# Patient Record
Sex: Male | Born: 1960 | Race: Black or African American | Hispanic: No | Smoking: Former smoker
Health system: Southern US, Community
[De-identification: ages and names within clinical notes are randomized; demographics above are authoritative.]

## PROBLEM LIST (undated history)

## (undated) DIAGNOSIS — J449 Chronic obstructive pulmonary disease, unspecified: Secondary | ICD-10-CM

## (undated) DIAGNOSIS — N182 Chronic kidney disease, stage 2 (mild): Secondary | ICD-10-CM

## (undated) DIAGNOSIS — J111 Influenza due to unidentified influenza virus with other respiratory manifestations: Secondary | ICD-10-CM

## (undated) DIAGNOSIS — Z720 Tobacco use: Secondary | ICD-10-CM

## (undated) DIAGNOSIS — I10 Essential (primary) hypertension: Secondary | ICD-10-CM

## (undated) DIAGNOSIS — D696 Thrombocytopenia, unspecified: Secondary | ICD-10-CM

## (undated) DIAGNOSIS — J189 Pneumonia, unspecified organism: Secondary | ICD-10-CM

## (undated) HISTORY — DX: Tobacco use: Z72.0

## (undated) HISTORY — DX: Chronic obstructive pulmonary disease, unspecified: J44.9

## (undated) HISTORY — PX: KNEE SURGERY: SHX244

## (undated) HISTORY — DX: Chronic kidney disease, stage 2 (mild): N18.2

## (undated) HISTORY — PX: FRACTURE SURGERY: SHX138

## (undated) HISTORY — DX: Thrombocytopenia, unspecified: D69.6

## (undated) HISTORY — DX: Influenza due to unidentified influenza virus with other respiratory manifestations: J11.1

---

## 2009-06-12 ENCOUNTER — Emergency Department (HOSPITAL_COMMUNITY): Admission: EM | Admit: 2009-06-12 | Discharge: 2009-06-12 | Payer: Self-pay | Admitting: Emergency Medicine

## 2010-06-29 ENCOUNTER — Inpatient Hospital Stay (HOSPITAL_COMMUNITY): Payer: Self-pay

## 2010-06-29 ENCOUNTER — Emergency Department (HOSPITAL_COMMUNITY): Payer: Self-pay

## 2010-06-29 ENCOUNTER — Inpatient Hospital Stay (HOSPITAL_COMMUNITY)
Admission: EM | Admit: 2010-06-29 | Discharge: 2010-06-30 | DRG: 190 | Disposition: A | Discharge status: Home / Self Care | Payer: Self-pay | Attending: Internal Medicine | Admitting: Internal Medicine

## 2010-06-29 DIAGNOSIS — A419 Sepsis, unspecified organism: Secondary | ICD-10-CM | POA: Diagnosis present

## 2010-06-29 DIAGNOSIS — J189 Pneumonia, unspecified organism: Secondary | ICD-10-CM | POA: Diagnosis present

## 2010-06-29 DIAGNOSIS — J441 Chronic obstructive pulmonary disease with (acute) exacerbation: Principal | ICD-10-CM | POA: Diagnosis present

## 2010-06-29 DIAGNOSIS — J9819 Other pulmonary collapse: Secondary | ICD-10-CM | POA: Diagnosis present

## 2010-06-29 DIAGNOSIS — F17200 Nicotine dependence, unspecified, uncomplicated: Secondary | ICD-10-CM | POA: Diagnosis present

## 2010-06-29 DIAGNOSIS — J45901 Unspecified asthma with (acute) exacerbation: Principal | ICD-10-CM | POA: Diagnosis present

## 2010-06-29 DIAGNOSIS — D7589 Other specified diseases of blood and blood-forming organs: Secondary | ICD-10-CM | POA: Diagnosis present

## 2010-06-29 LAB — CBC
HCT: 44.9 % (ref 39.0–52.0)
Hemoglobin: 15 g/dL (ref 13.0–17.0)
MCHC: 33.4 g/dL (ref 30.0–36.0)
MCV: 76.9 fL — ABNORMAL LOW (ref 78.0–100.0)
RBC: 5.84 MIL/uL — ABNORMAL HIGH (ref 4.22–5.81)
RDW: 13.2 % (ref 11.5–15.5)
WBC: 13 10*3/uL — ABNORMAL HIGH (ref 4.0–10.5)

## 2010-06-29 LAB — BASIC METABOLIC PANEL
CO2: 26 mEq/L (ref 19–32)
Chloride: 107 mEq/L (ref 96–112)
Creatinine, Ser: 1.32 mg/dL (ref 0.4–1.5)
GFR calc Af Amer: >60 mL/min (ref >60)
Glucose, Bld: 99 mg/dL (ref 70–99)

## 2010-06-29 LAB — DIFFERENTIAL
Basophils Relative: 1 % (ref 0–1)
Eosinophils Relative: 7 % — ABNORMAL HIGH (ref 0–5)
Monocytes Relative: 10 % (ref 3–12)

## 2010-06-30 LAB — DIFFERENTIAL
Basophils Relative: 0 % (ref 0–1)
Eosinophils Absolute: 0 10*3/uL (ref 0.0–0.7)
Eosinophils Relative: 0 % (ref 0–5)
Monocytes Absolute: 2.1 10*3/uL — ABNORMAL HIGH (ref 0.1–1.0)
Neutrophils Relative %: 87 % — ABNORMAL HIGH (ref 43–77)

## 2010-06-30 LAB — BASIC METABOLIC PANEL
BUN: 11 mg/dL (ref 6–23)
Calcium: 9.3 mg/dL (ref 8.4–10.5)
Creatinine, Ser: 1.23 mg/dL (ref 0.4–1.5)
GFR calc Af Amer: >60 mL/min (ref >60)
GFR calc non Af Amer: >60 mL/min (ref >60)
Sodium: 141 mEq/L (ref 135–145)

## 2010-06-30 LAB — CBC
RBC: 5.58 MIL/uL (ref 4.22–5.81)
RDW: 13.5 % (ref 11.5–15.5)
WBC: 26.8 10*3/uL — ABNORMAL HIGH (ref 4.0–10.5)

## 2010-07-02 NOTE — H&P (Signed)
[name], [name]                 ACCOUNT NO.:  [id number]  MEDICAL RECORD NO.:  [id number]           PATIENT TYPE:  I  LOCATION:  3740                         FACILITY:  [facility]  PHYSICIAN:  [name] [name], M.D. DATE OF BIRTH:  [birth year]  DATE OF ADMISSION:  06/29/2010 DATE OF DISCHARGE:                             HISTORY & PHYSICAL   PRIMARY CARE PHYSICIAN:  None.  CHIEF COMPLAINT:  Shortness of breath, wheezing, chest tightness.  HISTORY OF PRESENT ILLNESS:  This is a 50 year old male who presents to the emergency department emergently secondary to complaints of worsening shortness of breath, wheezing, and chest tightness over the past 4 days. The patient does have a history of asthma and is also a smoker.  He denies having any fevers, chills, nausea, vomiting, or diarrhea.  He does report having a cough that has been nonproductive.  PAST MEDICAL HISTORY:  Asthma.  PAST SURGICAL HISTORY:  Right knee arthroscopic surgery.  MEDICATIONS:  Albuterol inhaler.  ALLERGIES:  No known drug allergies.  SOCIAL HISTORY:  The patient is a Curator.  He is a smoker, smokes a pack of cigarettes daily for 42 years.  He is a drinker, drinks 1 beer, 40 ounces daily.  He denies any illicit drug usage.  FAMILY HISTORY:  Noncontributory.  REVIEW OF SYSTEMS:  Pertinent as mentioned above.  PHYSICAL EXAMINATION FINDINGS:  GENERAL:  This is a 50 year old well- nourished, well-developed African American male who is in discomfort and mild distress. VITAL SIGNS:  Temperature 98.1, blood pressure 145/96, heart rate 92 to 108, respirations 22, O2 sats 93 to 98%. HEENT:  Normocephalic, atraumatic.  Pupils equally round, reactive to light.  Extraocular movements are intact.  Funduscopic benign.  There is no scleral icterus.  Nares are patent bilaterally.  Oropharynx is clear. NECK:  Supple.  Full range of motion.  No thyromegaly, adenopathy, jugular venous distention. CARDIOVASCULAR:   Regular rate and rhythm.  No murmurs, gallops, or rubs appreciated. LUNGS:  Clear to auscultation bilaterally.  No rales, rhonchi, or wheezes. ABDOMEN:  Positive bowel sounds, soft, nontender, nondistended.  No hepatosplenomegaly. EXTREMITIES:  Without cyanosis, clubbing, or edema. NEUROLOGIC:  Nonfocal.  LABORATORY STUDIES:  White blood cell count 13.0, hemoglobin 15.0, hematocrit 44.9, MCV 76.9, platelets 217, neutrophils 55%, lymphocytes 27%.  Sodium 139, potassium 3.8, chloride 107, carbon dioxide 26, BUN 10, creatinine 1.32, and glucose 99.  Calcium level 8.9.  Chest x-ray reveals no acute disease findings.  ASSESSMENT:  A 50 year old male being admitted with, 1. Asthma exacerbation. 2. Shortness of breath. 3. Tobacco abuse.  PLAN:  The patient will be admitted to telemetry area for monitoring and IV steroid taper has been initiated.  The patient has been placed on supplemental oxygen and nebulizer treatments will continue to be administered.  The patient will be placed on albuterol and Atrovent nebulizer treatments and the Avelox therapy will continue but changed to oral therapy.  A nicotine patch will also be ordered and the patient has been counseled regarding tobacco cessation.  DVT prophylaxis has also been ordered.  The patient is a full code.     [name] [name]  [name] [name], M.D.     HJ/MEDQ  D:  06/29/2010  T:  06/29/2010  Job:  [id number]  Electronically Signed by [name] [name] M.D. on 07/02/2010 11:25:08 PM

## 2010-07-22 NOTE — Discharge Summary (Signed)
[name] [name], [name] [name]                 ACCOUNT NO.:  [id number]  MEDICAL RECORD NO.:  [id number]           PATIENT TYPE:  I  LOCATION:  3010                         FACILITY:  [facility]  PHYSICIAN:  Valetta Close, M.D.   DATE OF BIRTH:  May 29, 1960  DATE OF ADMISSION:  06/29/2010 DATE OF DISCHARGE:  06/30/2010                              DISCHARGE SUMMARY   DISCHARGE DIAGNOSES: 1. Left lower lung pneumonia improved. 2. Asthma/chronic obstructive pulmonary disease exacerbation. 3. Inadequate material resources. 4. Macrocytosis but with normal hemoglobin. 5. Tobacco use.  DISCHARGE MEDICATIONS: 1. Doxycycline 100 mg by mouth twice a day for 6 days. 2. Albuterol 90 mcg inhaler 2 puffs every 4 hours as needed for     shortness of breath. 3. Prednisone 50 mg on March 23, then 40 mg on March 24, then 30 mg,     then 20 mg, then 10 mg, then stop.  DISPOSITION AND FOLLOWUP:  He will follow up with Julieanne Manson on Tuesday April 24 at 3 p.m.  I feel I would have had him a followup appointment considerably sooner than this given his asthma may be worsening and given for earlier followup given that he is here with a pneumonia and that does have a high re-admission date, unfortunately this is the earliest we can get.  At this point, please consider obtaining pulmonary function tests.  I have also talked with him about the need for screening colonoscopy given he has a mother who died of colon cancer and he has never had a colonoscopy and he does have macrocytosis plus hemoglobin was stable, so we will discuss this with him.  We will also discuss tobacco cessation and make sure that he does not run out of his albuterol.  I explained that he might need a controller medication, but again he might be unable to avoid it.  BRIEF ADMISSION HISTORY AND PHYSICAL:  [name] [name] is a 50 year old with a past medical history as previously stated who presented with shortness of breath and asthma  exacerbation and cough.  For more detailed history and physical, please refer to the admission dictation by Dr. Della Goo.  HOSPITAL COURSE: 1. Left lower lung pneumonia, did have atelectasis on the x-ray.  Also     had a heart rate of 108 and a respiratory rate of 22, make him meet     criteria for sepsis.  We started him on Avelox, he did improve.  We     gave him steroids as we think we it resolved; however, he is unable     to afford Avelox, so I have switched him to doxycycline and for 1     day of doxycycline, he continues to do well.  He is also on oral     steroids and he is doing well from that standpoint as well, so we     will discharge him on doxycycline, steroids, and albuterol and     again the need for outpatient followup is crucial. 2. Sepsis.  Again this was present on admission as previously stated.     His white count  was 13, his heart rate was greater than 90s,     respiratory rate was greater than 20.  This was before he received     any steroid.  Again, he has improved his white count of 27 but that     is after we begun his steroids.  He clinically appears okay.  He is     afebrile.  His heart rate is okay and he looks clinically well, so     discharge home is reasonable.  DISCHARGE LABS AND VITALS:  Temperature 98, heart rate 81, blood pressure 153/96, respiratory rate 19, O2 sat 97% on 2 L, white count 27, hemoglobin 14, hematocrit 44, platelets 234, sodium 141, potassium 4, chloride 105, bicarb of 30, BUN 11, creatinine 1.22, glucose 109, calcium 9.3.  Approximately length of this discharge was approximately 30 minutes.     Valetta Close, M.D.     JC/MEDQ  D:  06/30/2010  T:  06/30/2010  Job:  [id number]  cc:   Marcene Duos, M.D.  Electronically Signed by Valetta Close M.D. on 07/22/2010 11:34:35 AM

## 2010-10-19 ENCOUNTER — Encounter: Payer: Self-pay | Admitting: Internal Medicine

## 2010-10-31 ENCOUNTER — Other Ambulatory Visit: Payer: Self-pay | Admitting: Internal Medicine

## 2010-11-16 ENCOUNTER — Telehealth: Payer: Self-pay | Admitting: *Deleted

## 2010-11-16 NOTE — Telephone Encounter (Signed)
[facility] Previsit for 11/21/10 at 4:30pm

## 2010-11-21 ENCOUNTER — Telehealth: Payer: Self-pay | Admitting: *Deleted

## 2010-11-21 ENCOUNTER — Encounter: Payer: Self-pay | Admitting: *Deleted

## 2010-11-21 NOTE — Telephone Encounter (Signed)
Spoke with wife, with pt. Talking in the background.  His wife said he [facility] today's previsit forgetting she has appts. On M, W, F's and he can only have his appt. On Tuesday or Thursday.  I offered him an 8am appt on Thursday 11/24/10 and he refused it and didn't want to come tomorrow(Tuesday)11/22/10.  I advised his wife to have him call and [facility] his colon when he was sure of his schedule, that it was too late after  Thursday to have the PV for his scheduled colon date on the 11/30/10.  Wyona Almas

## 2010-11-30 ENCOUNTER — Other Ambulatory Visit: Payer: Self-pay | Admitting: Internal Medicine

## 2011-06-08 ENCOUNTER — Encounter (HOSPITAL_COMMUNITY): Payer: Self-pay | Admitting: Emergency Medicine

## 2011-06-08 ENCOUNTER — Emergency Department (INDEPENDENT_AMBULATORY_CARE_PROVIDER_SITE_OTHER)
Admission: EM | Admit: 2011-06-08 | Discharge: 2011-06-08 | Disposition: A | Discharge status: Home Health | Payer: Self-pay | Source: Home / Self Care | Attending: Emergency Medicine | Admitting: Emergency Medicine

## 2011-06-08 DIAGNOSIS — M109 Gout, unspecified: Secondary | ICD-10-CM

## 2011-06-08 LAB — POCT I-STAT, CHEM 8
Calcium, Ion: 1.23 mmol/L (ref 1.12–1.32)
Chloride: 102 mEq/L (ref 96–112)
Creatinine, Ser: 1.4 mg/dL — ABNORMAL HIGH (ref 0.50–1.35)
HCT: 54 % — ABNORMAL HIGH (ref 39.0–52.0)
Hemoglobin: 18.4 g/dL — ABNORMAL HIGH (ref 13.0–17.0)
Potassium: 4.2 mEq/L (ref 3.5–5.1)
TCO2: 28 mmol/L (ref 0–100)

## 2011-06-08 LAB — URIC ACID: Uric Acid, Serum: 8.3 mg/dL — ABNORMAL HIGH (ref 4.0–7.8)

## 2011-06-08 MED ORDER — PREDNISONE 20 MG PO TABS: ORAL_TABLET | ORAL | Status: AC

## 2011-06-08 MED ORDER — DICLOFENAC SODIUM 1 % TD GEL: 1.0000 "application " | Freq: Four times a day (QID) | TRANSDERMAL | Status: DC

## 2011-06-08 MED ORDER — HYDROCODONE-ACETAMINOPHEN 5-325 MG PO TABS: 2.0000 | ORAL_TABLET | ORAL | Status: AC | PRN

## 2011-06-08 MED ORDER — COLCHICINE 0.6 MG PO TABS: ORAL_TABLET | ORAL | Status: DC

## 2011-06-08 NOTE — ED Notes (Signed)
Give patient urinal at patient request

## 2011-06-08 NOTE — Discharge Instructions (Signed)
You will  need to see an orthopedic surgeon to have the fluid drawn off of your knee. Take the medication as written. Return if you've a fever above 100.4, if you get worse, or for any other concerns.

## 2011-06-08 NOTE — ED Notes (Signed)
Pt did not want Ace wrap on Lt knee and MD Mortenson informed of what Pt stated.

## 2011-06-08 NOTE — ED Provider Notes (Signed)
History     CSN: [id number]  Arrival date & time 06/08/11  1053   First MD Initiated Contact with Patient 06/08/11 1149      Chief Complaint  Patient presents with  . Joint Swelling    (Consider location/radiation/quality/duration/timing/severity/associated sxs/prior treatment) HPI  Past Medical History  Diagnosis Date  . Asthma   . Gout     Past Surgical History  Procedure Date  . Knee surgery     right knee s/p trauma    History reviewed. No pertinent family history.  History  Substance Use Topics  . Smoking status: Current Everyday Smoker  . Smokeless tobacco: Not on file  . Alcohol Use: Yes      Review of Systems  Allergies  Review of patient's allergies indicates no known allergies.  Home Medications   Current Outpatient Rx  Name Route Sig Dispense Refill  . ACETAMINOPHEN 500 MG PO TABS Oral Take 500 mg by mouth every 6 (six) hours as needed.    . ALBUTEROL IN Inhalation Inhale into the lungs.      BP 135/86  Pulse 95  Temp(Src) 98.9 F (37.2 C) (Oral)  Resp 16  SpO2 96%  Physical Exam  ED Course  Procedures (including critical care time)   Labs Reviewed  URIC ACID   No results found.   No diagnosis found.  Patient not seen by med  MDM          Randa Spike, MD 06/29/11 [phone]

## 2011-06-08 NOTE — ED Provider Notes (Signed)
History     CSN: [id number]  Arrival date & time 06/08/11  1053   First MD Initiated Contact with Patient 06/08/11 1149      Chief Complaint  Patient presents with  . Joint Swelling    (Consider location/radiation/quality/duration/timing/severity/associated sxs/prior treatment) HPI Comments: Patient with left knee swelling, tenderness starting approximately a week ago after going for an extended walk. States his knee was feeling "stiff" prior to this, as if he was about to have a gout flare. Patient states that he is not taking his maintenance medications, and does not remember the name of these. Has also not been adhering to a strict diet.. States that last time this happened, and he had a therapeutic knee tap, and was diagnosed with gout. This happened about 6 or 7 years ago. States he usually gets gout in his left knee or his left foot, and that this is identical to previous gout flares. States his left foot is "fine" today. No nausea, vomiting, fevers, rash, bruising, distal paresthesias. No recent or remote history of trauma. Patient is not diabetic.  ROS as noted in HPI. All other ROS negative.   Patient is a 51 y.o. male presenting with knee pain. The history is provided by the patient. No language interpreter was used.  Knee Pain This is a recurrent problem. The current episode started more than 1 week ago. The problem occurs constantly. The problem has not changed since onset.The symptoms are aggravated by walking. The symptoms are relieved by nothing. He has tried a cold compress for the symptoms. The treatment provided mild relief.    Past Medical History  Diagnosis Date  . Asthma   . Gout     Past Surgical History  Procedure Date  . Knee surgery     right knee s/p trauma    History reviewed. No pertinent family history.  History  Substance Use Topics  . Smoking status: Current Everyday Smoker  . Smokeless tobacco: Not on file  . Alcohol Use: Yes      Review  of Systems  Allergies  Review of patient's allergies indicates no known allergies.  Home Medications   Current Outpatient Rx  Name Route Sig Dispense Refill  . ALBUTEROL IN Inhalation Inhale into the lungs.    . COLCHICINE 0.6 MG PO TABS  2 tabs po x 1, then one tab po 1 hour later 6 tablet 0  . DICLOFENAC SODIUM 1 % TD GEL Topical Apply 1 application topically 4 (four) times daily. 100 g 0  . HYDROCODONE-ACETAMINOPHEN 5-325 MG PO TABS Oral Take 2 tablets by mouth every 4 (four) hours as needed for pain. 20 tablet 0  . PREDNISONE 20 MG PO TABS  2 tabs po once daily x 3 days 6 tablet 0    BP 135/86  Pulse 95  Temp(Src) 98.9 F (37.2 C) (Oral)  Resp 16  SpO2 96%  Physical Exam  Nursing note and vitals reviewed. Constitutional: He is oriented to person, place, and time. He appears well-developed and well-nourished.  HENT:  Head: Normocephalic and atraumatic.  Eyes: Conjunctivae and EOM are normal.  Neck: Normal range of motion.  Cardiovascular: Normal rate.   Pulmonary/Chest: Effort normal. No respiratory distress.  Abdominal: He exhibits no distension.  Musculoskeletal: Normal range of motion.       Left knee: He exhibits swelling and effusion.       Increased warmth when compared to other knee. No rash, redness., skin intact. Knee ROM slightly decreased due  to pain, Flexion  intact, Tenderness entire joint, Patella NT, Patellar apprehension test negative, Patellar tendon NT, Medial joint NT , Lateral joint NT, Popliteal region NT, Lachman's stable, Varus stress testing stable, Valgus stress testing stable, McMurray's testing normal, distal NVI with intact baseline sensation / motor / pulse distal to knee.  Neurological: He is alert and oriented to person, place, and time.  Skin: Skin is warm and dry.  Psychiatric: He has a normal mood and affect. His behavior is normal.    ED Course  Procedures (including critical care time)  Labs Reviewed  URIC ACID - Abnormal; Notable  for the following:    Uric Acid, Serum 8.3 (*)    All other components within normal limits  POCT I-STAT, CHEM 8 - Abnormal; Notable for the following:    Creatinine, Ser 1.40 (*)    Hemoglobin 18.4 (*)    HCT 54.0 (*)    All other components within normal limits   No results found.   1. Gout attack       MDM  Previous records reviewed, noncontributory. Do not suspect septic joint. Patient states he has a history of gout in his knee, and he is not taking the medicines he is supposed to to prevent it. Patient with no record of elevated uric acid level, or diagnosis of gout in the current system. We'll send off a uric acid level advised patient to [place] a working phone number so that we can contact him if needed. Patient has mild renal insufficiency, so will not start him on systemic NSAIDs. Rather, we'll send him home with prednisone, colchicine x3 doses, and topical diclofenac. Will have him see an orthopedic surgeon for joint tap.  Luiz Blare, MD 06/08/11 [phone]

## 2011-06-08 NOTE — ED Notes (Signed)
Swollen knee for one week.  Patient reports having "fluid drawn off " in the past.  Pain in knee around left knee.  Patient reports swelling and pain

## 2011-06-08 NOTE — ED Notes (Signed)
Patient in gown on exam table

## 2011-06-08 NOTE — ED Notes (Signed)
Checked on pt ,adjusted bed for pt for comfort  Measures.brought drink to family member.

## 2011-06-12 ENCOUNTER — Telehealth (HOSPITAL_COMMUNITY): Payer: Self-pay | Admitting: *Deleted

## 2011-06-12 NOTE — ED Notes (Signed)
Pt. called and asked for his lab result. Pt. verified x 2 and given result and told this indicates gout He said he has had it before. Pt.said he has not filled his Rx.'s yet because he could not afford them. He asked which ones would treat the gout. Pt. has a PCP at [facility] but orange card expired.   Instructed to fill Colchicine and Prednisone and f/u with PCP. Pt. states he has an appt. 4/5 for orange card. I told him about Diane here on Fri. for [facility] and should arrive @ 0745 to be one of the first 10 to be seen. He said he does not have transportation at that time.I told him he can go to [facility] and see her M-Thur. at a time more convenient. He needs to get the orange card renewed now so he can f/u ASAP with [facility]. Vassie Moselle 06/12/2011

## 2011-06-12 NOTE — ED Notes (Signed)
I called back and daughter answered. I told her about [email or web address] to get coupon for cheaper medication. [name] [name] 06/12/2011

## 2011-07-03 ENCOUNTER — Emergency Department (HOSPITAL_COMMUNITY): Payer: Self-pay

## 2011-07-03 ENCOUNTER — Emergency Department (HOSPITAL_COMMUNITY)
Admission: EM | Admit: 2011-07-03 | Discharge: 2011-07-03 | Disposition: A | Discharge status: Home / Self Care | Payer: Self-pay | Attending: Emergency Medicine | Admitting: Emergency Medicine

## 2011-07-03 DIAGNOSIS — M25569 Pain in unspecified knee: Secondary | ICD-10-CM | POA: Insufficient documentation

## 2011-07-03 DIAGNOSIS — M25462 Effusion, left knee: Secondary | ICD-10-CM

## 2011-07-03 DIAGNOSIS — M25469 Effusion, unspecified knee: Secondary | ICD-10-CM | POA: Insufficient documentation

## 2011-07-03 LAB — SYNOVIAL CELL COUNT + DIFF, W/ CRYSTALS
Eosinophils-Synovial: 0 % (ref 0–1)
Neutrophil, Synovial: 71 % — ABNORMAL HIGH (ref 0–25)
Other Cells-SYN: 0

## 2011-07-03 MED ORDER — OXYCODONE-ACETAMINOPHEN 5-325 MG PO TABS: 1.0000 | ORAL_TABLET | ORAL | Status: AC | PRN

## 2011-07-03 MED ORDER — IBUPROFEN 800 MG PO TABS: 800.0000 mg | ORAL_TABLET | Freq: Three times a day (TID) | ORAL | Status: AC

## 2011-07-03 NOTE — ED Notes (Signed)
Discharge instructions reviewed with pt; verbalizes understanding.  No questions asked; no further c/o's voiced.  Pt ambulatory to lobby.  NAD noted. 

## 2011-07-03 NOTE — ED Provider Notes (Signed)
Medical screening examination/treatment/procedure(s) were conducted as a shared visit with non-physician practitioner(s) and myself.  I personally evaluated the patient during the encounter 51 year old man has had gout in his big toe in the past, about a month ago had pain and an effusion in the left knee, Dx's as gout at an urgent care center, treated with prednisone.  Exam shows a moderate effusion in the left knee, no redness or heat.  X-ray negative.  Recommend arthrocentesis, Rx with prednisone for presumed gout.  He is working to get reapproved to be a patient at [facility].   Carleene Cooper III, MD 07/03/11 [phone]

## 2011-07-03 NOTE — ED Notes (Signed)
PT went to [facility] 1 wk ago for gout on LT knee. Pt was sent home with meds the patient returns today because fluid is still present on Lt knee.

## 2011-07-03 NOTE — Discharge Instructions (Signed)
[name] is fluid off of your left knee today. The labs are still pending. Take ibuprofen for pain. Take Percocet for severe pain do not drive with this medication. Followup with Dr. Luiz Blare suite.  Return for severe pain or any other concerns.  Knee Effusion  Knee effusion means you have fluid in your knee. The knee may be more difficult to bend and move. HOME CARE  Use crutches or a brace as told by your doctor.   Put ice on the injured area.   Put ice in a plastic bag.   Place a towel between your skin and the bag.   Leave the ice on for 15 to 20 minutes, 3 to 4 times a day.   Raise (elevate) your knee as much as possible.   Only take medicine as told by your doctor.   You may need to do strengthening exercises. Ask your doctor.   Continue with your normal diet and activities as told by your doctor.  GET HELP RIGHT AWAY IF:  You have more puffiness (swelling) in your knee.   You see redness, puffiness, or have more pain in your knee.   You have a temperature by mouth above 102 F (38.9 C).   You get a rash.   You have trouble breathing.   You have a reaction to any medicine you are taking.   You have a lot of pain when you move your knee.  MAKE SURE YOU:  Understand these instructions.   Will watch your condition.   Will get help right away if you are not doing well or get worse.  Document Released: 04/29/2010 Document Revised: 03/16/2011 Document Reviewed: 04/29/2010 [facility] Patient Information 2012 [place], [place].Knee Effusion  Knee effusion means you have fluid in your knee. The knee may be more difficult to bend and move. HOME CARE  Use crutches or a brace as told by your doctor.   Put ice on the injured area.   Put ice in a plastic bag.   Place a towel between your skin and the bag.   Leave the ice on for 15 to 20 minutes, 3 to 4 times a day.   Raise (elevate) your knee as much as possible.   Only take medicine as told by your doctor.     You may need to do strengthening exercises. Ask your doctor.   Continue with your normal diet and activities as told by your doctor.  GET HELP RIGHT AWAY IF:  You have more puffiness (swelling) in your knee.   You see redness, puffiness, or have more pain in your knee.   You have a temperature by mouth above 102 F (38.9 C).   You get a rash.   You have trouble breathing.   You have a reaction to any medicine you are taking.   You have a lot of pain when you move your knee.  MAKE SURE YOU:  Understand these instructions.   Will watch your condition.   Will get help right away if you are not doing well or get worse.  Document Released: 04/29/2010 Document Revised: 03/16/2011 Document Reviewed: 04/29/2010 [facility] Patient Information 2012 [place], [place].Knee Effusion  Knee effusion means you have fluid in your knee. The knee may be more difficult to bend and move. HOME CARE  Use crutches or a brace as told by your doctor.   Put ice on the injured area.   Put ice in a plastic bag.   Place a towel  between your skin and the bag.   Leave the ice on for 15 to 20 minutes, 3 to 4 times a day.   Raise (elevate) your knee as much as possible.   Only take medicine as told by your doctor.   You may need to do strengthening exercises. Ask your doctor.   Continue with your normal diet and activities as told by your doctor.  GET HELP RIGHT AWAY IF:  You have more puffiness (swelling) in your knee.   You see redness, puffiness, or have more pain in your knee.   You have a temperature by mouth above 102 F (38.9 C).   You get a rash.   You have trouble breathing.   You have a reaction to any medicine you are taking.   You have a lot of pain when you move your knee.  MAKE SURE YOU:  Understand these instructions.   Will watch your condition.   Will get help right away if you are not doing well or get worse.  Document Released: 04/29/2010 Document Revised:  03/16/2011 Document Reviewed: 04/29/2010 [facility] Patient Information 2012 [place], [place].Joint Injection Care After Refer to this sheet in the next few days. These instructions provide you with information on caring for yourself after you have had a joint injection. Your caregiver also may give you more specific instructions. Your treatment has been planned according to current medical practices, but problems sometimes occur. Call your caregiver if you have any problems or questions after your procedure. After any type of joint injection, it is not uncommon to experience:  Soreness, swelling, or bruising around the injection site.   Mild numbness, tingling, or weakness around the injection site caused by the numbing medicine used before or with the injection.  It also is possible to experience the following effects associated with the specific agent after injection:  Iodine-based contrast agents:   Allergic reaction (itching, hives, widespread redness, and swelling beyond the injection site).   Corticosteroids (These effects are rare.):   Allergic reaction.   Increased blood sugar levels (If you have diabetes and you notice that your blood sugar levels have increased, notify your caregiver).   Increased blood pressure levels.   Mood swings.   Hyaluronic acid in the use of viscosupplementation.   Temporary heat or redness.   Temporary rash and itching.   Increased fluid accumulation in the injected joint.  These effects all should resolve within a day after your procedure.  HOME CARE INSTRUCTIONS  Limit yourself to light activity the day of your procedure. Avoid lifting heavy objects, bending, stooping, or twisting.   Take prescription or over-the-counter pain medication as directed by your caregiver.   You may apply ice to your injection site to reduce pain and swelling the day of your procedure. Ice may be applied 3 to 4 times:   Put ice in a plastic bag.   Place a towel  between your skin and the bag.   Leave the ice on for no longer than 15 to 20 minutes each time.  SEEK IMMEDIATE MEDICAL CARE IF:   Pain and swelling get worse rather than better or extend beyond the injection site.   Numbness does not go away.   Blood or fluid continues to leak from the injection site.   You have chest pain.   You have swelling of your face or tongue.   You have trouble breathing or you become dizzy.   You develop a fever, chills, or severe tenderness  at the injection site that last longer than 1 day.  MAKE SURE YOU:  Understand these instructions.   Watch your condition.   Get help right away if you are not doing well or if you get worse.  Document Released: 12/08/2010 Document Revised: 03/16/2011 Document Reviewed: 12/08/2010 [facility] Patient Information 2012 [place], [place].

## 2011-07-04 LAB — PATHOLOGIST SMEAR REVIEW

## 2011-08-02 NOTE — ED Provider Notes (Signed)
History     CSN: [id number]  Arrival date & time 07/03/11  1406   First MD Initiated Contact with Patient 07/03/11 1552      Chief Complaint  Patient presents with  . Knee Injury    fluid on LT knee caused by gout    (Consider location/radiation/quality/duration/timing/severity/associated sxs/prior treatment) HPI Comments: Patient presents with a painful and swollen left knee associated with a gouty flair.  He was treated with cochicine one week ago at our [facility] but pain,  Swelling and feeling of joint instability persists.  He denies injury,  Fevers,  He is not an IV drug user.  Pain is constant,  Non radiating and worsened with ambulation.  He has used heat therapy which has not been helpful.  The history is provided by the patient.    Past Medical History  Diagnosis Date  . Asthma   . Gout     Past Surgical History  Procedure Date  . Knee surgery     right knee s/p trauma    No family history on file.  History  Substance Use Topics  . Smoking status: Current Everyday Smoker  . Smokeless tobacco: Not on file  . Alcohol Use: Yes      Review of Systems  Constitutional: Negative for fever.  Gastrointestinal: Negative for nausea.  Musculoskeletal: Positive for joint swelling and arthralgias.  Skin: Negative for wound.  Neurological: Negative for weakness and headaches.    Allergies  Review of patient's allergies indicates no known allergies.  Home Medications   Current Outpatient Rx  Name Route Sig Dispense Refill  . ALBUTEROL SULFATE HFA 108 (90 BASE) MCG/ACT IN AERS Inhalation Inhale 2 puffs into the lungs every 4 (four) hours as needed. For shortness of breath    . COLCHICINE 0.6 MG PO TABS Oral Take 0.6 mg by mouth daily. 2 tabs po x 1, then one tab po 1 hour later    . NAPROXEN SODIUM 220 MG PO TABS Oral Take 220 mg by mouth 2 (two) times daily as needed. For pain      BP 144/91  Pulse 91  Temp(Src) 98 F (36.7 C) (Oral)  Resp 20  SpO2  91%  Physical Exam  Nursing note and vitals reviewed. Constitutional: He appears well-developed and well-nourished.  HENT:  Head: Normocephalic.  Cardiovascular: Normal rate and intact distal pulses.  Exam reveals no decreased pulses.   Pulses:      Dorsalis pedis pulses are 2+ on the right side, and 2+ on the left side.       Posterior tibial pulses are 2+ on the right side, and 2+ on the left side.  Musculoskeletal: He exhibits edema and tenderness.       Left knee: He exhibits decreased range of motion, swelling and effusion. He exhibits no erythema. tenderness found.  Neurological: He is alert. No sensory deficit.  Skin: Skin is warm, dry and intact.    ED Course  ARTHOCENTESIS Performed by: Angelia Hazell L Authorized by: Candis Musa Consent: Verbal consent obtained. Risks and benefits: risks, benefits and alternatives were discussed Consent given by: patient Patient understanding: patient states understanding of the procedure being performed Patient identity confirmed: verbally with patient Time out: Immediately prior to procedure a "time out" was called to verify the correct patient, procedure, equipment, support staff and site/side marked as required. Indications: joint swelling  Body area: knee Joint: left knee Local anesthesia used: yes Anesthesia: local infiltration Local anesthetic: lidocaine 2% with  epinephrine Anesthetic total: 4 ml Preparation: Patient was prepped and draped in the usual sterile fashion. Needle gauge: 18 G Approach: medial Aspirate: serous and yellow Aspirate amount: 60 ml Patient tolerance: Patient tolerated the procedure well with no immediate complications. Comments: Patient with significant relief of pain after arthrocentesis performed.   (including critical care time)  Labs Reviewed  CELL COUNT + DIFF,  W/ CRYST-SYNVL FLD - Abnormal; Notable for the following:    Color, Synovial AMBER (*)    Appearance-Synovial CLOUDY (*)    WBC,  Synovial 1741 (*)    Neutrophil, Synovial 71 (*)    Monocyte-Macrophage-Synovial Fluid 22 (*)    All other components within normal limits  BODY FLUID CULTURE  PATHOLOGIST SMEAR REVIEW  LAB REPORT - SCANNED   No results found.   1. Knee effusion, left       MDM  Pt also seen by Dr.Davidson prior to arthrocentesis performed.  Ibuprofen,  Oxycodone.  Pt referred to Dr. Luiz Blare for further management.  Xrays reviewed.  Pt dispositioned by Levy Sjogren,  NP.        Candis Musa, PA 08/02/11 1400

## 2011-08-03 NOTE — ED Provider Notes (Signed)
Medical screening examination/treatment/procedure(s) were conducted as a shared visit with non-physician practitioner(s) and myself.  I personally evaluated the patient during the encounter Pt is a 51 year old man with a recurrent knee effusion.  He was treated for gout with colchicine a week ago.  Exam shows a moderate sized left knee effusion.  Recommend arthrocentesis for diagnostic as well as therapeutic purposes.  Carleene Cooper III, MD 08/03/11 [phone]

## 2012-07-28 ENCOUNTER — Encounter (HOSPITAL_COMMUNITY): Payer: Self-pay | Admitting: *Deleted

## 2012-07-28 ENCOUNTER — Emergency Department (HOSPITAL_COMMUNITY): Payer: Self-pay

## 2012-07-28 ENCOUNTER — Inpatient Hospital Stay (HOSPITAL_COMMUNITY)
Admission: EM | Admit: 2012-07-28 | Discharge: 2012-07-31 | DRG: 194 | Disposition: A | Discharge status: Home / Self Care | Payer: MEDICAID | Attending: Internal Medicine | Admitting: Internal Medicine

## 2012-07-28 DIAGNOSIS — D696 Thrombocytopenia, unspecified: Secondary | ICD-10-CM

## 2012-07-28 DIAGNOSIS — D45 Polycythemia vera: Secondary | ICD-10-CM | POA: Diagnosis present

## 2012-07-28 DIAGNOSIS — F17200 Nicotine dependence, unspecified, uncomplicated: Secondary | ICD-10-CM | POA: Diagnosis present

## 2012-07-28 DIAGNOSIS — N179 Acute kidney failure, unspecified: Secondary | ICD-10-CM

## 2012-07-28 DIAGNOSIS — D72819 Decreased white blood cell count, unspecified: Secondary | ICD-10-CM | POA: Diagnosis present

## 2012-07-28 DIAGNOSIS — Z720 Tobacco use: Secondary | ICD-10-CM

## 2012-07-28 DIAGNOSIS — F1410 Cocaine abuse, uncomplicated: Secondary | ICD-10-CM | POA: Diagnosis present

## 2012-07-28 DIAGNOSIS — IMO0001 Reserved for inherently not codable concepts without codable children: Secondary | ICD-10-CM

## 2012-07-28 DIAGNOSIS — J1100 Influenza due to unidentified influenza virus with unspecified type of pneumonia: Principal | ICD-10-CM | POA: Diagnosis present

## 2012-07-28 DIAGNOSIS — E871 Hypo-osmolality and hyponatremia: Secondary | ICD-10-CM | POA: Diagnosis not present

## 2012-07-28 DIAGNOSIS — D751 Secondary polycythemia: Secondary | ICD-10-CM

## 2012-07-28 DIAGNOSIS — N182 Chronic kidney disease, stage 2 (mild): Secondary | ICD-10-CM | POA: Diagnosis present

## 2012-07-28 DIAGNOSIS — M109 Gout, unspecified: Secondary | ICD-10-CM | POA: Diagnosis present

## 2012-07-28 DIAGNOSIS — N189 Chronic kidney disease, unspecified: Secondary | ICD-10-CM

## 2012-07-28 DIAGNOSIS — J1289 Other viral pneumonia: Secondary | ICD-10-CM | POA: Diagnosis present

## 2012-07-28 DIAGNOSIS — R030 Elevated blood-pressure reading, without diagnosis of hypertension: Secondary | ICD-10-CM

## 2012-07-28 DIAGNOSIS — F17201 Nicotine dependence, unspecified, in remission: Secondary | ICD-10-CM | POA: Diagnosis present

## 2012-07-28 DIAGNOSIS — Z23 Encounter for immunization: Secondary | ICD-10-CM

## 2012-07-28 DIAGNOSIS — R0902 Hypoxemia: Secondary | ICD-10-CM

## 2012-07-28 DIAGNOSIS — J111 Influenza due to unidentified influenza virus with other respiratory manifestations: Secondary | ICD-10-CM

## 2012-07-28 DIAGNOSIS — J441 Chronic obstructive pulmonary disease with (acute) exacerbation: Secondary | ICD-10-CM | POA: Diagnosis present

## 2012-07-28 DIAGNOSIS — J45901 Unspecified asthma with (acute) exacerbation: Secondary | ICD-10-CM

## 2012-07-28 DIAGNOSIS — J189 Pneumonia, unspecified organism: Secondary | ICD-10-CM

## 2012-07-28 HISTORY — DX: Thrombocytopenia, unspecified: D69.6

## 2012-07-28 LAB — POCT I-STAT, CHEM 8
Chloride: 97 mEq/L (ref 96–112)
HCT: 52 % (ref 39.0–52.0)
Hemoglobin: 17.7 g/dL — ABNORMAL HIGH (ref 13.0–17.0)
Potassium: 4.1 mEq/L (ref 3.5–5.1)

## 2012-07-28 LAB — CBC WITH DIFFERENTIAL/PLATELET
Basophils Absolute: 0 10*3/uL (ref 0.0–0.1)
Basophils Relative: 0 % (ref 0–1)
Eosinophils Absolute: 0 10*3/uL (ref 0.0–0.7)
Eosinophils Relative: 0 % (ref 0–5)
HCT: 44.4 % (ref 39.0–52.0)
Hemoglobin: 14.9 g/dL (ref 13.0–17.0)
Lymphocytes Relative: 24 % (ref 12–46)
Lymphs Abs: 1.1 10*3/uL (ref 0.7–4.0)
MCH: 25.4 pg — ABNORMAL LOW (ref 26.0–34.0)
MCHC: 33.6 g/dL (ref 30.0–36.0)
MCV: 75.8 fL — ABNORMAL LOW (ref 78.0–100.0)
Monocytes Absolute: 1 10*3/uL (ref 0.1–1.0)
Monocytes Relative: 22 % — ABNORMAL HIGH (ref 3–12)
Neutro Abs: 2.4 10*3/uL (ref 1.7–7.7)
Neutrophils Relative %: 53 % (ref 43–77)
Platelets: 125 10*3/uL — ABNORMAL LOW (ref 150–400)
RBC: 5.86 MIL/uL — ABNORMAL HIGH (ref 4.22–5.81)
RDW: 13.4 % (ref 11.5–15.5)
WBC: 4.5 10*3/uL (ref 4.0–10.5)

## 2012-07-28 MED ORDER — ACETAMINOPHEN 325 MG PO TABS
650.0000 mg | ORAL_TABLET | Freq: Once | ORAL | Status: AC
  Administered 2012-07-28: 650 mg via ORAL
  Filled 2012-07-28: qty 2

## 2012-07-28 MED ORDER — AZITHROMYCIN 1 G PO PACK: 1.0000 g | PACK | Freq: Once | ORAL | Status: DC

## 2012-07-28 MED ORDER — PREDNISONE 20 MG PO TABS: 60.0000 mg | ORAL_TABLET | Freq: Once | ORAL | Status: DC

## 2012-07-28 MED ORDER — DEXTROSE 5 % IV SOLN
1.0000 g | Freq: Once | INTRAVENOUS | Status: AC
  Administered 2012-07-28: 1 g via INTRAVENOUS
  Filled 2012-07-28: qty 10

## 2012-07-28 MED ORDER — IPRATROPIUM BROMIDE 0.02 % IN SOLN
0.5000 mg | Freq: Once | RESPIRATORY_TRACT | Status: AC
  Administered 2012-07-28: 0.5 mg via RESPIRATORY_TRACT
  Filled 2012-07-28: qty 2.5

## 2012-07-28 MED ORDER — ALBUTEROL SULFATE (5 MG/ML) 0.5% IN NEBU
2.5000 mg | INHALATION_SOLUTION | RESPIRATORY_TRACT | Status: AC
  Administered 2012-07-28 (×3): 2.5 mg via RESPIRATORY_TRACT
  Filled 2012-07-28 (×3): qty 0.5

## 2012-07-28 NOTE — ED Provider Notes (Signed)
History     CSN: [id number]  Arrival date & time 07/28/12  2039   First MD Initiated Contact with Patient 07/28/12 2058      Chief Complaint  Patient presents with  . Influenza    (Consider location/radiation/quality/duration/timing/severity/associated sxs/prior treatment) Patient is a 52 y.o. male presenting with shortness of breath. The history is provided by the patient.  Shortness of Breath Severity:  Moderate Onset quality:  Gradual Timing:  Constant Progression:  Waxing and waning Chronicity:  New Context comment:  History of asthma, has 1 day of fever/chills, and dyspnea/wheezing Associated symptoms: cough, fever and wheezing   Associated symptoms: no abdominal pain, no chest pain, no diaphoresis, no neck pain, no rash and no vomiting     Past Medical History  Diagnosis Date  . Asthma   . Gout     Past Surgical History  Procedure Laterality Date  . Knee surgery      right knee s/p trauma    No family history on file.  History  Substance Use Topics  . Smoking status: Current Every Day Smoker  . Smokeless tobacco: Not on file  . Alcohol Use: Yes      Review of Systems  Constitutional: Positive for fever and chills. Negative for diaphoresis, activity change and appetite change.  HENT: Negative for neck pain.   Respiratory: Positive for cough, shortness of breath and wheezing. Negative for chest tightness.   Cardiovascular: Negative for chest pain and palpitations.  Gastrointestinal: Negative for nausea, vomiting, abdominal pain, diarrhea and constipation.  Skin: Negative for rash and wound.  Neurological: Negative for dizziness, syncope, weakness, light-headedness and numbness.  All other systems reviewed and are negative.    Allergies  Shellfish allergy  Home Medications   Current Outpatient Rx  Name  Route  Sig  Dispense  Refill  . acetaminophen (TYLENOL) 500 MG tablet   Oral   Take 500 mg by mouth every 6 (six) hours as needed for pain.        Marland Kitchen albuterol (PROVENTIL HFA;VENTOLIN HFA) 108 (90 BASE) MCG/ACT inhaler   Inhalation   Inhale 2 puffs into the lungs every 4 (four) hours as needed. For shortness of breath         . amoxicillin (AMOXIL) 500 MG capsule   Oral   Take 500 mg by mouth 2 (two) times daily.           BP 159/104  Pulse 94  Temp(Src) 99.4 F (37.4 C) (Oral)  Resp 24  SpO2 92%  Physical Exam  Nursing note and vitals reviewed. Constitutional: He appears well-developed and well-nourished.  HENT:  Head: Normocephalic and atraumatic.  Right Ear: External ear normal.  Left Ear: External ear normal.  Nose: Nose normal.  Mouth/Throat: Oropharynx is clear and moist. No oropharyngeal exudate.  Eyes: Conjunctivae are normal. Pupils are equal, round, and reactive to light.  Neck: Normal range of motion. Neck supple.  Cardiovascular: Normal rate, regular rhythm, normal heart sounds and intact distal pulses.   Pulmonary/Chest: Effort normal. No respiratory distress. He has wheezes (bilaterally wheezing). He has no rales. He exhibits no tenderness.  Abdominal: Soft. Bowel sounds are normal. He exhibits no distension and no mass. There is no tenderness. There is no rebound and no guarding.  Musculoskeletal: Normal range of motion. He exhibits no edema and no tenderness.  Neurological: He is alert. He displays normal reflexes. No cranial nerve deficit. He exhibits normal muscle tone. Coordination normal.  Skin: Skin is warm and  dry. No rash noted. No erythema. No pallor.  Psychiatric: He has a normal mood and affect. His behavior is normal. Judgment and thought content normal.    ED Course  Procedures (including critical care time)  Labs Reviewed  CBC WITH DIFFERENTIAL - Abnormal; Notable for the following:    RBC 5.86 (*)    MCV 75.8 (*)    MCH 25.4 (*)    Platelets 125 (*)    Monocytes Relative 22 (*)    All other components within normal limits  POCT I-STAT, CHEM 8 - Abnormal; Notable for the  following:    Creatinine, Ser 1.50 (*)    Calcium, Ion 1.10 (*)    Hemoglobin 17.7 (*)    All other components within normal limits  CULTURE, BLOOD (ROUTINE X 2)  CULTURE, BLOOD (ROUTINE X 2)  PATHOLOGIST SMEAR REVIEW  COMPREHENSIVE METABOLIC PANEL  MAGNESIUM  PHOSPHORUS  HIV ANTIBODY (ROUTINE TESTING)  LEGIONELLA ANTIGEN, URINE  STREP PNEUMONIAE URINARY ANTIGEN  URINALYSIS, ROUTINE W REFLEX MICROSCOPIC  CREATININE, URINE, RANDOM  SODIUM, URINE, RANDOM   Dg Chest 2 View  07/28/2012  *RADIOLOGY REPORT*  Clinical Data: Influenza  CHEST - 2 VIEW  Comparison: 06/29/2010  Findings: Heart size upper normal.  Central vascular prominence. Left lung base and right mid lung opacity.  Background hyperinflation, peribronchial thickening / interstitial coarsening. Multilevel degenerative change.  No pleural effusion or pneumothorax.  IMPRESSION: Left lung base and right mid lung opacity; scarring, atelectasis, or infiltrate.  Background hyperinflation/interstitial coarsening, most in keeping with COPD.   Original Report Authenticated By: Jearld Lesch, M.D.      1. Community acquired pneumonia   2. Hypoxia   3. Asthma exacerbation       MDM  52 yo M presents for dyspnea/wheezing, fever/chills, and non-productive cough of 1 day. Pt wheezing diffusely and bilaterally and with new O2 requirement (2L/min). Suspect asthma exacerbation and pneumonia. Albuterol and atrovent administered. CXR reveals left lower lung pneumonia, will treat community-acquired pneumonia (Azithromycin/Rocephin). Despite albuterol and atrovent, pt continued to be hypoxic on room air. Internal medicine consulted for admission.        Clemetine Marker, MD 07/29/12 [phone]

## 2012-07-28 NOTE — ED Notes (Signed)
Pt arrived via [facility] c/o flu like symptoms

## 2012-07-28 NOTE — ED Provider Notes (Signed)
Complaint of nonproductive cough and shortness of breath onset 3 days ago.. No treatment prior to coming here. On exam patient is mild respiratory distress speaks in sentences lungs diminished breath sounds diffusely. Mild retractioons   Medical decision making in light of chest x-ray findings, fever we'll treat with antibiotics for community quire pneumonia. Patient has oxygen requirement .  Doug Sou, MD 07/28/12 [phone]

## 2012-07-28 NOTE — H&P (Signed)
Hospital Admission Note Date: 07/29/2012  Patient name: [name] [name] Medical record number: [id number] Date of birth: Jan 22, 1961 Age: 52 y.o. Gender: male PCP: No PCP Per Patient  Medical Service: Internal Medicine Attending physician: Dr. Kem Kays    1st Contact: Dr. Earlene Plater Pager:[phone] 2nd Contact: Dr. Manson Passey Pager:[phone] After 5 pm or weekends: 1st Contact: Pager: [phone] 2nd Contact: Pager: [phone]  Chief Complaint: Cough, shortness of breath, fever   History of Present Illness: 52 y.o PMH asthma, gout.  He does not follow consistently with PCP and [facility] 8 months ago.   He presents with symptoms of cough with clear sputum, congestion , sob (with rest and exertion), wheezing subjective fever, chills, myalgias, sweats since Thursday prior to admission.  He tried Alkaseltzer plus, Halls, tylenol, and leftover Amoxicillin which he finished today without relief.  He states he had Amoxicilin due to going to a doctor in [place] for back pain 3 weeks ago and he was given Amoxicillin.  He has been out of his Albuterol inhaler x 2 days.  Other associated symptom includes h/a associated with coughing.  He denies sick contacts but lives with grandchildren age 73 and 62, did not have flu shot this year.  He was given Tylenol, Duoneb, Rocephin and AZM in the ED.   Meds: Current Outpatient Rx  Name  Route  Sig  Dispense  Refill  . acetaminophen (TYLENOL) 500 MG tablet   Oral   Take 500 mg by mouth every 6 (six) hours as needed for pain.           Allergies: Allergies as of 07/28/2012 - Review Complete 07/28/2012  Allergen Reaction Noted  . Shellfish allergy Anaphylaxis 07/28/2012   Past Medical History  Diagnosis Date  . Asthma   . Gout    Past Surgical History  Procedure Laterality Date  . Knee surgery      right knee s/p trauma   Family History  Problem Relation Age of Onset  . Colon cancer      mother ? age of diagnosis    History    Social History  . Marital Status: Legally Separated    Spouse Name: N/A    Number of Children: N/A  . Years of Education: N/A   Occupational History  . Not on file.   Social History Main Topics  . Smoking status: Current Every Day Smoker  . Smokeless tobacco: Not on file  . Alcohol Use: Yes  . Drug Use: No  . Sexually Active:    Other Topics Concern  . Not on file   Social History Narrative   07/28/12    Lives with daughter and grandkids x 2. 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 Denies liquor, wine. History of cocaine use. Denies IVDU   Has an orange card    Sexually active with 1 partner uses protection              Review of Systems: General: +fever, +chills, appetite normal, denies weight loss, +myalgias, +sweats  HEENT: +h/a associated with coughin  Cardiac: denies chest pain  Pulm: +cough with clear sputum, +congestion, +sob (at rest and with exertion), +wheezing (he has been out of his Albuterol inhaler x 2 days)  Abd/GU: denies abdominal pain, denies difficultly urinating, denies dysuria, denies  blood in urin Ext: denies swelling in lower extremities  Neuro: denies lightheadedness or dizziness  Other: denies sick contacts but lives with grandchildren age 69 and 28, did not have flu shot this year  Physical Exam: 94-95% on [place] 8 L 159/89 (105) HR 95  Blood pressure 159/89, pulse 94, temperature 101.8 F (38.8 C), temperature source Rectal, resp. rate 18, SpO2 93.00%. General: resting in bed, nad, alert and oriented x 3 HEENT: PERRL, no scleral icterus, poor oral dentition  Cardiac: RRR, no rubs, murmurs or gallops Pulm: wheezing b/l, mild use of accessory muscles on 8-9L [place] Abd: soft, nontender, nondistended, BS present, obese ab Ext: warm and well perfused, no pedal edema, distal pulses intact  Neuro: alert and oriented X3, neurologically  intact    Lab results: Basic Metabolic Panel:  Recent Labs  16/10/96 2123  NA 136  K 4.1  CL 97  GLUCOSE 91  BUN 13  CREATININE 1.50*   Liver Function Tests: No results found for this basename: AST, ALT, ALKPHOS, BILITOT, PROT, ALBUMIN,  in the last 72 hours No results found for this basename: LIPASE, AMYLASE,  in the last 72 hours No results found for this basename: AMMONIA,  in the last 72 hours CBC:  Recent Labs  07/28/12 2108 07/28/12 2123  WBC 4.5  --   NEUTROABS 2.4  --   HGB 14.9 17.7*  HCT 44.4 52.0  MCV 75.8*  --   PLT 125*  --    Urine Drug Screen: Drugs of Abuse  No results found for this basename: labopia,  cocainscrnur,  labbenz,  amphetmu,  thcu,  labbarb    Urinalysis: No results found for this basename: COLORURINE, APPERANCEUR, LABSPEC, PHURINE, GLUCOSEU, HGBUR, BILIRUBINUR, KETONESUR, PROTEINUR, UROBILINOGEN, NITRITE, LEUKOCYTESUR,  in the last 72 hours Misc. Labs: blood cultures, sputum cultures, Legionella, Strep pneumo, influenza and HIV, Mag, Phos, UDS, UA, urine lytes   Imaging results:  Dg Chest 2 View  07/28/2012  *RADIOLOGY REPORT*  Clinical Data: Influenza  CHEST - 2 VIEW  Comparison: 06/29/2010  Findings: Heart size upper normal.  Central vascular prominence. Left lung base and right mid lung opacity.  Background hyperinflation, peribronchial thickening / interstitial coarsening. Multilevel degenerative change.  No pleural effusion or pneumothorax.  IMPRESSION: Left lung base and right mid lung opacity; scarring, atelectasis, or infiltrate.  Background hyperinflation/interstitial coarsening, most in keeping with COPD.   Original Report Authenticated By: Jearld Lesch, M.D.     Other results: EKG: pending   Assessment & Plan by Problem: 53 y.o male with tobacco abuse, asthma presents febrile 101.8, tachypneic, dyspnea with CXR with left and right lung opacities.    1. Shortness of breath -Etiology likely multifactorial i.e asthma  exacerbation and CAP -He does not have leukocytosis but was febrile 101.8 and slightly tachypneic to 24 -CXR with left lung base and right mid lung opacity; scarring, atelectasis, or infiltrate. Background hyperinflation/interstitial coarsening, most in keeping with COPD -will order blood cultures, sputum cultures, Legionella, Strep pneumo, influenza and HIV  -Patient has not flu shot this year and has smoking history  -Given Rocephin and AZM in the ED. Will continue Rocephin and AZM -hold on systemic steroid in illness with likely pneumonia   2. Acute on chronic renal failure -Denies history of kidney disease.  Creatinine 1.50 on admission. Prior baseline 1.2-1.3  -Etiology could be prerenal due to decrease oral intake, dehydration.  Another possibility could be related to undiagnosed HTN -Will order urine lytes  -Consider  renal US  -trend BMET  3. Thrombocytopenia  -Unclear etiology.  Platelets could be low in acute illness.  No recent hospitalizations for possible Heparin exposure.  Patient does drink alcohol but does not seem to be in excess per report. -Trend CBC, check HIV   4. ?obstructive lung disease (?asthma vs COPD history) -Per CXR. No pfts on file Patient will need outpatient pfts  -will do Duoneb and Flovent inhaler   5. Tobacco abuse  -Encourage smoking cessation   6. Elevated Blood pressure on admission possibly undiagnosed HTN -BP 154/104 on admission.  Denies history of HTN -Monitor VS -start antihypertensive if needed in the future for elevated BP  7. Polycythemia -likely related to COPD or asthma or smoking history   8. F/E/N -NS 75 cc/hr  -will monitor electrolytes  -renal diet   9. DVT px  -Heparin sq   Dispo: Disposition is deferred at this time, awaiting improvement of current medical problems. Anticipated discharge in approximately 2-3 day(s).   The patient does not have a current PCP therefore will need to establish.   It is unknown if the  patient has transportation limitations that hinder transportation to clinic appointments.  SignedAnnett Gula [phone] 07/29/2012, 12:30 AM

## 2012-07-29 ENCOUNTER — Encounter: Payer: Self-pay | Admitting: Internal Medicine

## 2012-07-29 ENCOUNTER — Encounter (HOSPITAL_COMMUNITY): Payer: Self-pay | Admitting: Internal Medicine

## 2012-07-29 ENCOUNTER — Telehealth: Payer: Self-pay | Admitting: Internal Medicine

## 2012-07-29 DIAGNOSIS — Z720 Tobacco use: Secondary | ICD-10-CM

## 2012-07-29 DIAGNOSIS — E871 Hypo-osmolality and hyponatremia: Secondary | ICD-10-CM | POA: Diagnosis not present

## 2012-07-29 DIAGNOSIS — F17201 Nicotine dependence, unspecified, in remission: Secondary | ICD-10-CM | POA: Diagnosis present

## 2012-07-29 DIAGNOSIS — D751 Secondary polycythemia: Secondary | ICD-10-CM | POA: Diagnosis present

## 2012-07-29 HISTORY — DX: Tobacco use: Z72.0

## 2012-07-29 LAB — RAPID URINE DRUG SCREEN, HOSP PERFORMED
Barbiturates: NOT DETECTED
Benzodiazepines: NOT DETECTED
Cocaine: POSITIVE — AB
Tetrahydrocannabinol: NOT DETECTED

## 2012-07-29 LAB — COMPREHENSIVE METABOLIC PANEL
ALT: 35 U/L (ref 0–53)
AST: 41 U/L — ABNORMAL HIGH (ref 0–37)
Albumin: 2.8 g/dL — ABNORMAL LOW (ref 3.5–5.2)
Alkaline Phosphatase: 59 U/L (ref 39–117)
BUN: 11 mg/dL (ref 6–23)
Chloride: 98 mEq/L (ref 96–112)
Potassium: 3.8 mEq/L (ref 3.5–5.1)
Sodium: 132 mEq/L — ABNORMAL LOW (ref 135–145)
Total Bilirubin: 0.3 mg/dL (ref 0.3–1.2)
Total Protein: 6.3 g/dL (ref 6.0–8.3)

## 2012-07-29 LAB — MAGNESIUM: Magnesium: 1.7 mg/dL (ref 1.5–2.5)

## 2012-07-29 LAB — URINALYSIS, ROUTINE W REFLEX MICROSCOPIC
Glucose, UA: NEGATIVE mg/dL
Hgb urine dipstick: NEGATIVE
Ketones, ur: NEGATIVE mg/dL
Leukocytes, UA: NEGATIVE
Protein, ur: 30 mg/dL — AB
Urobilinogen, UA: 1 mg/dL (ref 0.0–1.0)

## 2012-07-29 LAB — CBC
Hemoglobin: 14.2 g/dL (ref 13.0–17.0)
MCH: 25.1 pg — ABNORMAL LOW (ref 26.0–34.0)
MCHC: 33.1 g/dL (ref 30.0–36.0)
Platelets: 108 10*3/uL — ABNORMAL LOW (ref 150–400)
RDW: 13.3 % (ref 11.5–15.5)

## 2012-07-29 LAB — LEGIONELLA ANTIGEN, URINE: Legionella Antigen, Urine: NEGATIVE

## 2012-07-29 LAB — CG4 I-STAT (LACTIC ACID): Lactic Acid, Venous: 1.01 mmol/L (ref 0.5–2.2)

## 2012-07-29 LAB — BASIC METABOLIC PANEL
BUN: 10 mg/dL (ref 6–23)
Calcium: 7.8 mg/dL — ABNORMAL LOW (ref 8.4–10.5)
Creatinine, Ser: 1.27 mg/dL (ref 0.50–1.35)
GFR calc Af Amer: 73 mL/min — ABNORMAL LOW (ref >90)
GFR calc non Af Amer: 63 mL/min — ABNORMAL LOW (ref >90)
Glucose, Bld: 95 mg/dL (ref 70–99)
Potassium: 4 mEq/L (ref 3.5–5.1)

## 2012-07-29 LAB — INFLUENZA PANEL BY PCR (TYPE A & B): Influenza A By PCR: NEGATIVE

## 2012-07-29 LAB — URINE MICROSCOPIC-ADD ON

## 2012-07-29 LAB — PATHOLOGIST SMEAR REVIEW: Path Review: NORMAL

## 2012-07-29 LAB — OSMOLALITY, URINE: Osmolality, Ur: 182 mOsm/kg — ABNORMAL LOW (ref 390–1090)

## 2012-07-29 MED ORDER — OSELTAMIVIR PHOSPHATE 75 MG PO CAPS
75.0000 mg | ORAL_CAPSULE | Freq: Two times a day (BID) | ORAL | Status: DC
  Administered 2012-07-29 – 2012-07-31 (×5): 75 mg via ORAL
  Filled 2012-07-29 (×6): qty 1

## 2012-07-29 MED ORDER — HEPARIN SODIUM (PORCINE) 5000 UNIT/ML IJ SOLN
5000.0000 [IU] | Freq: Three times a day (TID) | INTRAMUSCULAR | Status: DC
  Administered 2012-07-29 – 2012-07-31 (×7): 5000 [IU] via SUBCUTANEOUS
  Filled 2012-07-29 (×9): qty 1

## 2012-07-29 MED ORDER — PNEUMOCOCCAL VAC POLYVALENT 25 MCG/0.5ML IJ INJ
0.5000 mL | INJECTION | INTRAMUSCULAR | Status: AC
Start: 2012-07-30 — End: 2012-07-30
  Administered 2012-07-30: 0.5 mL via INTRAMUSCULAR
  Filled 2012-07-29: qty 0.5

## 2012-07-29 MED ORDER — SODIUM CHLORIDE 0.9 % IJ SOLN
3.0000 mL | Freq: Two times a day (BID) | INTRAMUSCULAR | Status: DC
  Administered 2012-07-29 – 2012-07-31 (×4): 3 mL via INTRAVENOUS

## 2012-07-29 MED ORDER — DEXTROSE 5 % IV SOLN
1.0000 g | Freq: Every day | INTRAVENOUS | Status: DC
  Administered 2012-07-29: 1 g via INTRAVENOUS
  Filled 2012-07-29: qty 10

## 2012-07-29 MED ORDER — OXYCODONE HCL 5 MG PO TABS: 5.0000 mg | ORAL_TABLET | Freq: Three times a day (TID) | ORAL | Status: DC | PRN

## 2012-07-29 MED ORDER — ALBUTEROL SULFATE (5 MG/ML) 0.5% IN NEBU
2.5000 mg | INHALATION_SOLUTION | Freq: Four times a day (QID) | RESPIRATORY_TRACT | Status: DC
  Administered 2012-07-30: 2.5 mg via RESPIRATORY_TRACT
  Filled 2012-07-29 (×2): qty 0.5

## 2012-07-29 MED ORDER — ACETAMINOPHEN 500 MG PO TABS
500.0000 mg | ORAL_TABLET | Freq: Three times a day (TID) | ORAL | Status: DC | PRN
  Administered 2012-07-29: 500 mg via ORAL
  Filled 2012-07-29 (×2): qty 1

## 2012-07-29 MED ORDER — SODIUM CHLORIDE 0.9 % IV SOLN
INTRAVENOUS | Status: DC
  Administered 2012-07-29 – 2012-07-30 (×3): via INTRAVENOUS

## 2012-07-29 MED ORDER — IPRATROPIUM BROMIDE 0.02 % IN SOLN
0.5000 mg | RESPIRATORY_TRACT | Status: DC
  Administered 2012-07-29 (×5): 0.5 mg via RESPIRATORY_TRACT
  Filled 2012-07-29 (×5): qty 2.5

## 2012-07-29 MED ORDER — ACETAMINOPHEN 500 MG PO TABS
1000.0000 mg | ORAL_TABLET | Freq: Three times a day (TID) | ORAL | Status: DC | PRN
  Filled 2012-07-29: qty 2

## 2012-07-29 MED ORDER — AZITHROMYCIN 500 MG PO TABS
500.0000 mg | ORAL_TABLET | ORAL | Status: DC
  Administered 2012-07-29: 500 mg via ORAL
  Filled 2012-07-29 (×2): qty 1

## 2012-07-29 MED ORDER — PREDNISONE 20 MG PO TABS
40.0000 mg | ORAL_TABLET | Freq: Every day | ORAL | Status: DC
  Administered 2012-07-29 – 2012-07-31 (×3): 40 mg via ORAL
  Filled 2012-07-29 (×5): qty 2

## 2012-07-29 MED ORDER — ALBUTEROL SULFATE (5 MG/ML) 0.5% IN NEBU
2.5000 mg | INHALATION_SOLUTION | RESPIRATORY_TRACT | Status: DC
  Administered 2012-07-29 (×5): 2.5 mg via RESPIRATORY_TRACT
  Filled 2012-07-29 (×5): qty 0.5

## 2012-07-29 MED ORDER — IPRATROPIUM BROMIDE 0.02 % IN SOLN
0.5000 mg | Freq: Four times a day (QID) | RESPIRATORY_TRACT | Status: DC
  Administered 2012-07-30: 0.5 mg via RESPIRATORY_TRACT
  Filled 2012-07-29 (×2): qty 2.5

## 2012-07-29 MED ORDER — FLUTICASONE PROPIONATE HFA 110 MCG/ACT IN AERO
2.0000 | INHALATION_SPRAY | Freq: Two times a day (BID) | RESPIRATORY_TRACT | Status: DC
  Administered 2012-07-29 – 2012-07-31 (×5): 2 via RESPIRATORY_TRACT
  Filled 2012-07-29: qty 12

## 2012-07-29 NOTE — Telephone Encounter (Signed)
This encounter was opened wrongly.

## 2012-07-29 NOTE — Progress Notes (Signed)
Subjective:    Interval Events:  Patient feels better this morning but not back to normal. He affirms that he began feeling lousy on Thursday with a dry cough. He began feeling dyspneic on Friday. He smoked crack cocaine on Saturday. He did not feel more dyspneic on Sunday though. He last smoked crack cocaine several months ago.     Objective:    Vital Signs:   Filed Vitals:   07/29/12 0304 07/29/12 0457 07/29/12 0802 07/29/12 0854  BP:  135/79  146/97  Pulse:  85  84  Temp:  99.5 F (37.5 C) 99.3 F (37.4 C) 99.6 F (37.6 C)  TempSrc:  Oral    Resp:  18  18  Height:      Weight:      SpO2: 96% 97%  97%    Weights: [employer]   07/29/12 0110  Weight: 184 lb 8.4 oz (83.7 kg)     Intake/Output:   Gross per 24 hour  Intake    890 ml  Output   1250 ml  Net   -360 ml      Last BM Date: 07/28/12   Physical Exam: GENERAL: alert and oriented; resting comfortably in bed and in no distress EYES: pupils equal, round, and reactive to light; sclera anicteric ENT: moist mucosa LUNGS: inspiratory and expiratory wheezing, no rales HEART: normal rate; regular rhythm ABDOMEN: soft, non-tender, normal bowel sounds, no masses palpated EXTREMITIES: no edema SKIN: normal turgor    Labs: Basic Metabolic Panel: Lab 07/28/12 2123 07/29/12 0019 07/29/12 0615  NA 136 132* 132*  K 4.1 3.8 4.0  CL 97 98 98  CO2  --  28 29  GLUCOSE 91 118* 95  BUN 13 11 10   CREATININE 1.50* 1.31 1.27  CALCIUM  --  8.0* 7.8*  MG  --  1.7  --   PHOS  --  2.4  --     CBC: Lab 07/28/12 2108 07/28/12 2123 07/29/12 0615  WBC 4.5  --  3.9*  NEUTROABS 2.4  --   --   HGB 14.9 17.7* 14.2  HCT 44.4 52.0 42.9  MCV 75.8*  --  75.9*  PLT 125*  --  108*      Medications:    Infusions: . sodium chloride 75 mL/hr at 07/29/12 0807     Scheduled Medications: . ipratropium  0.5 mg Nebulization Q4H   And  . albuterol  2.5 mg Nebulization Q4H  . azithromycin  500 mg Oral Q24H  .  cefTRIAXone (ROCEPHIN)  IV  1 g Intravenous QHS  . fluticasone  2 puff Inhalation BID  . heparin  5,000 Units Subcutaneous Q8H  . predniSONE  40 mg Oral Q breakfast  . sodium chloride  3 mL Intravenous Q12H     PRN Medications: acetaminophen    Assessment/ Plan:    1.   Pneumonitis:  Possibly a bacterial pneumonia but more likely a viral pneumonia versus crack lung. 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 Strep pneumo urine antigen negative. - Continue azithromycin - Continue ceftriaxone - Start prednisone 40 mg daily - Continue scheduled ipratropium and albuterol - Blood culture, Legionella antigen, and influenza PCR are pending  2.   AKI with CKD stage II: Baseline creatinine around 1.3. Came in elevated at 1.5. Down to 1.27 this morning.   3.   Thrombocytopenia:  Platelets 125 at admission, down  to one late this morning. Previously they were in the 200s in March 2012.  HIV pending. - HIV pending - Peripheral smear pending  4.   Hypocalcemia:  Calcium low at 7.8 but this corrects to 8.8. Phosphorus is normal at 2.4. Magnesium is low-normal at 1.7.   5.   Hyponatremia:  Serum osmolality is calculated at 273, indicating hypotonic hyponatremia. Urine sodium is 157 with a calculated FENa of 1.04%.  If he is hypovolemic, this would indicate renal losses (mineralocorticoid deficiency) since his is not on a diuretic.  I do not believe he is hypervolemic.  If he is euvolemic, this could be SIADH given his pulmonary disease noted above. - urine osmolality   6.   Prophylaxis:  Heparin Homer  7.   Disposition:  No PCP. Will need to be seen in the [facility].  Expected length of stay is < 2 more nights.  No transportation limitations.    Length of Stay: 1 days   Signed by:  Dorthula Rue. Earlene Plater, MD PGY-I, Internal Medicine Pager [phone]  07/29/2012, 9:03 AM

## 2012-07-29 NOTE — Progress Notes (Signed)
Utilization review completed.  

## 2012-07-29 NOTE — H&P (Signed)
INTERNAL MEDICINE TEACHING SERVICE Attending Admission Note  Date: 07/29/2012  Patient name: [name] [name]  Medical record number: [id number]  Date of birth: Oct 30, 1960    I have seen and evaluated [name] and discussed their care with the Residency Team.  51 yr. Old AAM w/ pmhx significant for self reported asthma, gout, crack cocaine use, presented with fever, SOB, cough.  He states he started to feel "bad" last Thursday and felt he had the flu. He reports initially feeling myalgias, congestion, dry cough which then progressed to some clear sputum.  He stated he began to feel a fever, chills, and sweating that night. He states on Saturday he smoked some crack cocaine in an attempt to feel better, but this did not help. He tried some OTC medications such as tylenol, Halls and Alkaseltzer plus without relief.  He states about 3 weeks ago he had "back pain" and was given amoxicillin (?) for this.  He states he also recently ran out of his albuterol inhaler. He reports smoking cigarettes as well for many years. He was noted to have a rectal temperature of 101.8 F in the ED as well as an O2 sat of 93% on 3 L [place].  He was noted to have a Cr of 1.4 on admission, marginally low ionized calcium of 1.10, RBC 5.86 and MCV of 76.  He had a monocyte predominant differential count of 22% and no leukocytosis.  A CXR was performed which revealed left lung base/right mid lung opacity; infiltrate versus atelectasis or scarring.  He was noted to have hyperinflation. He was started on Rocephin and azithromycin in the ED for possible CAP. This morning he feels slightly better and has had a very mild cough, no sputum. He is noted to be on 4L [place] with O2 sat of 91%.   Physical Exam: Blood pressure 146/97, pulse 84, temperature 99.6 F (37.6 C), temperature source Oral, resp. rate 18, height 5\' 6"  (1.676 m), weight 184 lb 8.4 oz (83.7 kg), SpO2 91.00%.  General: Vital signs reviewed and noted. Well-developed,  well-nourished, in no acute distress; alert, appropriate and cooperative throughout examination.  Head: Normocephalic, atraumatic.  Eyes: PERRL, EOMI, No signs of anemia or jaundince.  Nose: Mucous membranes moist, not inflammed, nonerythematous.  Throat: Oropharynx nonerythematous, no exudate appreciated.   Neck: No deformities, masses, or tenderness noted.Supple, No carotid Bruits, no JVD.  Lungs:  Bibasilar wheezing, expiratory. Decreased breath sounds.  Heart: RRR. S1 and S2 normal without gallop, murmur, or rubs.  Abdomen:  BS normoactive. Soft, Nondistended, non-tender.  No masses or organomegaly.  Extremities: No pretibial edema.  Neurologic: A&O X3, CN II - XII are grossly intact. Motor strength is 5/5 in the all 4 extremities, Sensations intact to light touch, Cerebellar signs negative.  Skin: No visible rashes, scars.    Lab results: Results for orders placed during the hospital encounter of 07/28/12 (from the past 24 hour(s))  CBC WITH DIFFERENTIAL     Status: Abnormal   Collection Time    07/28/12  9:08 PM      Result Value Range   WBC 4.5  4.0 - 10.5 K/uL   RBC 5.86 (*) 4.22 - 5.81 MIL/uL   Hemoglobin 14.9  13.0 - 17.0 g/dL   HCT 40.9  81.1 - 91.4 %   MCV 75.8 (*) 78.0 - 100.0 fL   MCH 25.4 (*) 26.0 - 34.0 pg   MCHC 33.6  30.0 - 36.0 g/dL   RDW 78.2  95.6 -  15.5 %   Platelets 125 (*) 150 - 400 K/uL   Neutrophils Relative 53  43 - 77 %   Neutro Abs 2.4  1.7 - 7.7 K/uL   Lymphocytes Relative 24  12 - 46 %   Lymphs Abs 1.1  0.7 - 4.0 K/uL   Monocytes Relative 22 (*) 3 - 12 %   Monocytes Absolute 1.0  0.1 - 1.0 K/uL   Eosinophils Relative 0  0 - 5 %   Eosinophils Absolute 0.0  0.0 - 0.7 K/uL   Basophils Relative 0  0 - 1 %   Basophils Absolute 0.0  0.0 - 0.1 K/uL  POCT I-STAT, CHEM 8     Status: Abnormal   Collection Time    07/28/12  9:23 PM      Result Value Range   Sodium 136  135 - 145 mEq/L   Potassium 4.1  3.5 - 5.1 mEq/L   Chloride 97  96 - 112 mEq/L    BUN 13  6 - 23 mg/dL   Creatinine, Ser 1.61 (*) 0.50 - 1.35 mg/dL   Glucose, Bld 91  70 - 99 mg/dL   Calcium, Ion 0.96 (*) 1.12 - 1.23 mmol/L   TCO2 29  0 - 100 mmol/L   Hemoglobin 17.7 (*) 13.0 - 17.0 g/dL   HCT 04.5  40.9 - 81.1 %  COMPREHENSIVE METABOLIC PANEL     Status: Abnormal   Collection Time    07/29/12 12:19 AM      Result Value Range   Sodium 132 (*) 135 - 145 mEq/L   Potassium 3.8  3.5 - 5.1 mEq/L   Chloride 98  96 - 112 mEq/L   CO2 28  19 - 32 mEq/L   Glucose, Bld 118 (*) 70 - 99 mg/dL   BUN 11  6 - 23 mg/dL   Creatinine, Ser 9.14  0.50 - 1.35 mg/dL   Calcium 8.0 (*) 8.4 - 10.5 mg/dL   Total Protein 6.3  6.0 - 8.3 g/dL   Albumin 2.8 (*) 3.5 - 5.2 g/dL   AST 41 (*) 0 - 37 U/L   ALT 35  0 - 53 U/L   Alkaline Phosphatase 59  39 - 117 U/L   Total Bilirubin 0.3  0.3 - 1.2 mg/dL   GFR calc non Af Amer 61 (*) >90 mL/min   GFR calc Af Amer 70 (*) >90 mL/min  MAGNESIUM     Status: None   Collection Time    07/29/12 12:19 AM      Result Value Range   Magnesium 1.7  1.5 - 2.5 mg/dL  PHOSPHORUS     Status: None   Collection Time    07/29/12 12:19 AM      Result Value Range   Phosphorus 2.4  2.3 - 4.6 mg/dL  STREP PNEUMONIAE URINARY ANTIGEN     Status: None   Collection Time    07/29/12 12:21 AM      Result Value Range   Strep Pneumo Urinary Antigen NEGATIVE  NEGATIVE  URINALYSIS, ROUTINE W REFLEX MICROSCOPIC     Status: Abnormal   Collection Time    07/29/12 12:21 AM      Result Value Range   Color, Urine YELLOW  YELLOW   APPearance CLEAR  CLEAR   Specific Gravity, Urine 1.019  1.005 - 1.030   pH 6.5  5.0 - 8.0   Glucose, UA NEGATIVE  NEGATIVE mg/dL   Hgb urine dipstick NEGATIVE  NEGATIVE  Bilirubin Urine NEGATIVE  NEGATIVE   Ketones, ur NEGATIVE  NEGATIVE mg/dL   Protein, ur 30 (*) NEGATIVE mg/dL   Urobilinogen, UA 1.0  0.0 - 1.0 mg/dL   Nitrite NEGATIVE  NEGATIVE   Leukocytes, UA NEGATIVE  NEGATIVE  CREATININE, URINE, RANDOM     Status: None    Collection Time    07/29/12 12:21 AM      Result Value Range   Creatinine, Urine 150.22    SODIUM, URINE, RANDOM     Status: None   Collection Time    07/29/12 12:21 AM      Result Value Range   Sodium, Ur 157    URINE RAPID DRUG SCREEN (HOSP PERFORMED)     Status: Abnormal   Collection Time    07/29/12 12:21 AM      Result Value Range   Opiates NONE DETECTED  NONE DETECTED   Cocaine POSITIVE (*) NONE DETECTED   Benzodiazepines NONE DETECTED  NONE DETECTED   Amphetamines NONE DETECTED  NONE DETECTED   Tetrahydrocannabinol NONE DETECTED  NONE DETECTED   Barbiturates NONE DETECTED  NONE DETECTED  URINE MICROSCOPIC-ADD ON     Status: None   Collection Time    07/29/12 12:21 AM      Result Value Range   Squamous Epithelial / LPF RARE  RARE   WBC, UA 0-2  <3 WBC/hpf   RBC / HPF 0-2  <3 RBC/hpf   Bacteria, UA RARE  RARE  CG4 I-STAT (LACTIC ACID)     Status: None   Collection Time    07/29/12 12:56 AM      Result Value Range   Lactic Acid, Venous 1.01  0.5 - 2.2 mmol/L  BASIC METABOLIC PANEL     Status: Abnormal   Collection Time    07/29/12  6:15 AM      Result Value Range   Sodium 132 (*) 135 - 145 mEq/L   Potassium 4.0  3.5 - 5.1 mEq/L   Chloride 98  96 - 112 mEq/L   CO2 29  19 - 32 mEq/L   Glucose, Bld 95  70 - 99 mg/dL   BUN 10  6 - 23 mg/dL   Creatinine, Ser 4.54  0.50 - 1.35 mg/dL   Calcium 7.8 (*) 8.4 - 10.5 mg/dL   GFR calc non Af Amer 63 (*) >90 mL/min   GFR calc Af Amer 73 (*) >90 mL/min  CBC     Status: Abnormal   Collection Time    07/29/12  6:15 AM      Result Value Range   WBC 3.9 (*) 4.0 - 10.5 K/uL   RBC 5.65  4.22 - 5.81 MIL/uL   Hemoglobin 14.2  13.0 - 17.0 g/dL   HCT 09.8  11.9 - 14.7 %   MCV 75.9 (*) 78.0 - 100.0 fL   MCH 25.1 (*) 26.0 - 34.0 pg   MCHC 33.1  30.0 - 36.0 g/dL   RDW 82.9  56.2 - 13.0 %   Platelets 108 (*) 150 - 400 K/uL    Imaging results:  Dg Chest 2 View  07/28/2012  *RADIOLOGY REPORT*  Clinical Data: Influenza  CHEST - 2  VIEW  Comparison: 06/29/2010  Findings: Heart size upper normal.  Central vascular prominence. Left lung base and right mid lung opacity.  Background hyperinflation, peribronchial thickening / interstitial coarsening. Multilevel degenerative change.  No pleural effusion or pneumothorax.  IMPRESSION: Left lung base and right mid lung opacity; scarring,  atelectasis, or infiltrate.  Background hyperinflation/interstitial coarsening, most in keeping with COPD.   Original Report Authenticated By: Jearld Lesch, M.D.      Assessment and Plan: I agree with the formulated Assessment and Plan with the following changes:  51 yr. Old AAM w/ pmhx significant for self reported asthma, gout, crack cocaine use, presented with fever, SOB, cough, with acute vs. Chronic hypoxic respiratory failure, possible pneumonitis/pneumonia. 1) Hypoxic respiratory failure: Given findings on CXR and hx tobacco use, this patient may have some component of COPD, but he will need outpatient PFTs once improved for diagnosis. I am not fully convinced he has CAP, his symptoms seem viral in nature.  I would obtain an ABG to analyze his CO2/O2/HCO3/pH on 4 L [place]. Agree with initiation of prednisone to treat this as an exacerbation of COPD vs. Crack lung. Agree with neb treatments. 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 His hyponatremia does not impress me as concerning and his FENa needs to be interpreted with caution in this patient with possible CKD. 3) Leukopenia/thrombocytopenia: I would send HIV Ab. Watch his Plt count over the next day. He has a microcytosis, will need outpatient f/u to assure he has a colonoscopy scheduled. -rest per resident note.   The treatment plan was discussed in detail with the patient.  Alternatives to treatment, side effects, risks and benefits, and complications were discussed with the patient. Informed consent was obtained. The patient agrees to proceed with  the current treatment plan.  Jonah Blue, DO 4/21/20149:44 AM

## 2012-07-29 NOTE — ED Provider Notes (Signed)
I have personally seen and examined the patient.  I have discussed the plan of care with the resident.  I have reviewed the documentation on PMH/FH/Soc. History.  I have reviewed the documentation of the resident and agree.  Doug Sou, MD 07/29/12 [phone]

## 2012-07-30 DIAGNOSIS — J111 Influenza due to unidentified influenza virus with other respiratory manifestations: Secondary | ICD-10-CM

## 2012-07-30 HISTORY — DX: Influenza due to unidentified influenza virus with other respiratory manifestations: J11.1

## 2012-07-30 LAB — BASIC METABOLIC PANEL
BUN: 11 mg/dL (ref 6–23)
Chloride: 99 mEq/L (ref 96–112)
GFR calc Af Amer: 80 mL/min — ABNORMAL LOW (ref >90)
GFR calc non Af Amer: 69 mL/min — ABNORMAL LOW (ref >90)
Potassium: 3.6 mEq/L (ref 3.5–5.1)

## 2012-07-30 LAB — CBC
HCT: 43.7 % (ref 39.0–52.0)
MCHC: 32 g/dL (ref 30.0–36.0)
Platelets: 102 10*3/uL — ABNORMAL LOW (ref 150–400)
RDW: 13.3 % (ref 11.5–15.5)
WBC: 3.6 10*3/uL — ABNORMAL LOW (ref 4.0–10.5)

## 2012-07-30 MED ORDER — AZITHROMYCIN 500 MG PO TABS
500.0000 mg | ORAL_TABLET | ORAL | Status: AC
  Administered 2012-07-30: 500 mg via ORAL
  Filled 2012-07-30: qty 1

## 2012-07-30 MED ORDER — ALBUTEROL SULFATE (5 MG/ML) 0.5% IN NEBU: 2.5000 mg | INHALATION_SOLUTION | Freq: Four times a day (QID) | RESPIRATORY_TRACT | Status: DC | PRN

## 2012-07-30 MED ORDER — IPRATROPIUM BROMIDE 0.02 % IN SOLN: 0.5000 mg | Freq: Four times a day (QID) | RESPIRATORY_TRACT | Status: DC | PRN

## 2012-07-30 NOTE — Progress Notes (Signed)
   CARE MANAGEMENT NOTE 07/30/2012  Patient:  [name] [name], [name] [name]   Account Number:  [id number]  Date Initiated:  07/29/2012  Documentation initiated by:  Darlyne Russian  Subjective/Objective Assessment:   Patient admitted with fever, cough.     Action/Plan:   Progression of care and discharge planning   Anticipated DC Date:     Anticipated DC Plan:  HOME/SELF CARE      DC Planning Services  CM consult      Choice offered to / List presented to:             Status of service:  In process, will continue to follow Medicare Important Message given?   (If response is "NO", the following Medicare IM given date fields will be blank) Date Medicare IM given:   Date Additional Medicare IM given:    Discharge Disposition:    Per UR Regulation:    If discussed at Long Length of Stay Meetings, dates discussed:    Comments:  07/30/2012  1200 Darlyne Russian RN, [place]  [phone] CM referral:  medication costs  MATCH process completed, will provide letter at time of discharge.

## 2012-07-30 NOTE — Progress Notes (Signed)
Pt. O2 sat was check at room air,before ambulation O2 sat was 90.After walking at the hallway O2 sat was 81.RN encourage pt. To have few deep breath, O2 sat increase to 86 and after five minutes O2 sat was 90.MD. Will be notified.keep monitoring pt. Closely.

## 2012-07-30 NOTE — Progress Notes (Signed)
INTERNAL MEDICINE TEACHING SERVICE Attending Note  Date: 07/30/2012  Patient name: [name] [name]  Medical record number: [id number]  Date of birth: 08/25/1960    This patient has been seen and discussed with the house staff. Please see their note for complete details. I concur with their findings with the following additions/corrections: Feels better this morning. Mild cough with clear sputum. Denies fever, chills.  He is currently on RA and will need to ambulate to make sure he does not become hypoxic.   I doubt this is bacterial pneumonia.  He had a + influenza B, but due to time at presentation since symptoms began and improvement, I do not necessarily think we need to continue a course of Tamiflu.  His crack cocaine use may have also contributed. He may have underlying COPD, will need to have PFT's as outpatient. I agreed with 5 day course of prednisone.  If he is able to ambulate today without significant SOB and hypoxia developing, he can be discharged home.   The treatment plan was discussed in detail with the patient.  Alternatives to treatment, side effects, risks and benefits, and complications were discussed with the patient. Informed consent was obtained. The patient agrees to proceed with the current treatment plan.  Jonah Blue, DO  07/30/2012, 11:36 AM

## 2012-07-30 NOTE — Progress Notes (Signed)
Subjective:    Interval Events:  Feeling better.  Patient was off oxygen when I saw him this morning.  He had just gotten up to use the bathroom.  He was not dyspneic.  He is still coughing, producing clear sputum.  No fevers, chills, or pains.    Objective:    Vital Signs:   Filed Vitals:   07/29/12 1338 07/29/12 1708 07/29/12 2023 07/30/12 0516  BP: 143/95 145/92 140/91 152/103  Pulse: 93 86 88 72  Temp: 99.8 F (37.7 C) 99.3 F (37.4 C) 98.9 F (37.2 C) 99.3 F (37.4 C)  TempSrc:   Oral Oral  Resp: 18 20 20 20   Height:      Weight:   183 lb 3.2 oz (83.1 kg)   SpO2: 94% 95% 92% 92%    Weights: 24-hour Weight change: -1 lb 5.2 oz (-0.6 kg)  Filed Weights   07/29/12 0110 07/29/12 2023  Weight: 184 lb 8.4 oz (83.7 kg) 183 lb 3.2 oz (83.1 kg)    Intake/Output:   Gross per 24 hour  Intake    970 ml  Output   3075 ml  Net  -2105 ml     Last BM Date: 07/28/12   Physical Exam: GENERAL: alert and oriented; resting comfortably in bed and in no distress EYES: pupils equal, round, and reactive to light; sclera anicteric ENT: moist mucosa LUNGS: expiratory wheezing, normal work of breathing HEART: normal rate; regular rhythm; normal S1 and S2, no S3 or S4 appreciated; no murmurs, rubs, or clicks ABDOMEN: soft, non-tender, normal bowel sounds, no masses palpated EXTREMITIES: no edema SKIN: normal turgor    Labs: Basic Metabolic Panel: Lab 07/28/12 2123 07/29/12 0019 07/29/12 0615 07/30/12 0514  NA 136 132* 132* 135  K 4.1 3.8 4.0 3.6  CL 97 98 98 99  CO2  --  28 29 29   GLUCOSE 91 118* 95 111*  BUN 13 11 10 11   CREATININE 1.50* 1.31 1.27 1.18  CALCIUM  --  8.0* 7.8* 8.2*  MG  --  1.7  --   --   PHOS  --  2.4  --   --     CBC: Lab 07/28/12 2108 07/28/12 2123 07/29/12 0615 07/30/12 0514  WBC 4.5  --  3.9* 3.6*  NEUTROABS 2.4  --   --   --   HGB 14.9 17.7* 14.2 14.0  HCT 44.4 52.0 42.9 43.7  MCV 75.8*  --  75.9* 76.1*  PLT 125*  --  108* 102*     Influenza PCR POSITIVE FLU B    Medications:    Infusions: . sodium chloride 75 mL/hr at 07/30/12 0520     Scheduled Medications: . ipratropium  0.5 mg Nebulization Q6H   And  . albuterol  2.5 mg Nebulization Q6H  . azithromycin  500 mg Oral Q24H  . cefTRIAXone (ROCEPHIN)  IV  1 g Intravenous QHS  . fluticasone  2 puff Inhalation BID  . heparin  5,000 Units Subcutaneous Q8H  . oseltamivir  75 mg Oral BID  . pneumococcal 23 valent vaccine  0.5 mL Intramuscular Tomorrow-1000  . predniSONE  40 mg Oral Q breakfast  . sodium chloride  3 mL Intravenous Q12H     PRN Medications: acetaminophen, oxyCODONE    Assessment/ Plan:    1.   Pneumonitis:  Most likely viral pneumonia from influenza B.  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 We will stop antibiotic treatments today, continue oseltamivir, and continue prednisone. -  Stop azithromycin and ceftriaxone - Continue prednisone 40 mg PO daily - Continue oseltamivir 75 mg BID (2/5) - Continue scheduled albuterol and ipratropium - Check saturations with ambulation  2.   Influenza B:  PCR positive for influenza B.  On oseltamivir.  3.   AKI with CKD stage II:  Baseline creatinine around 1.3. Came in elevated at 1.5. Down to 1.18 this morning.   4.   Thrombocytopenia:  Platelets 125 at admission.  Has trended to 108 and now 102.  Previously they were in the 200s in March 2012. HIV negative.  Peripheral smear unremarkable.  5.   Hypocalcemia:  Calcium low at 8.2 but this corrects to 9.2. Phosphorus is normal at 2.4. Magnesium is low-normal at 1.7.   6.   Hyponatremia:  Serum osmolality was calculated at 273, indicating hypotonic hyponatremia. Urine sodium was 157 with a calculated FENa of 1.04%. Sodium has now normalized to 135 with IV hydration, making SIADH unlikely and dehydration the most likely etiology.  7.   Prophylaxis: Heparin    8.   Disposition:  No PCP. Will need to be seen in the [facility]. Expected length of stay is <  2 more nights. No transportation limitations.  Needs to be weaned off of oxygen for discharge.    Length of Stay: 2 days   Signed by:  Dorthula Rue. Earlene Plater, MD PGY-I, Internal Medicine Pager [phone]  07/30/2012, 8:44 AM

## 2012-07-30 NOTE — Care Management Note (Signed)
   CARE MANAGEMENT NOTE 07/30/2012  Patient:  [name] [name], [name] [name]   Account Number:  [id number]  Date Initiated:  07/29/2012  Documentation initiated by:  Darlyne Russian  Subjective/Objective Assessment:   Patient admitted with fever, cough.     Action/Plan:   Progression of care and discharge planning   Anticipated DC Date:     Anticipated DC Plan:  HOME/SELF CARE      DC Planning Services  CM consult  MATCH Program      Choice offered to / List presented to:             Status of service:  In process, will continue to follow Medicare Important Message given?   (If response is "NO", the following Medicare IM given date fields will be blank) Date Medicare IM given:   Date Additional Medicare IM given:    Discharge Disposition:    Per UR Regulation:    If discussed at Long Length of Stay Meetings, dates discussed:    Comments:  07/30/2012  1200 Darlyne Russian RN, [place]  [phone] CM referral:  medication costs  MATCH process completed, will provide letter at time of discharge. CM will assist with medications thru MATCH program at time of d/c if needed, pt will pay $3 per medication with letter from case management. This will not cover pain meds, over the counter meds or anti-anxiety meds. CRoyal RN MPH, case manager, [phone]

## 2012-07-31 DIAGNOSIS — D696 Thrombocytopenia, unspecified: Secondary | ICD-10-CM

## 2012-07-31 DIAGNOSIS — J1100 Influenza due to unidentified influenza virus with unspecified type of pneumonia: Principal | ICD-10-CM

## 2012-07-31 DIAGNOSIS — N179 Acute kidney failure, unspecified: Secondary | ICD-10-CM

## 2012-07-31 DIAGNOSIS — J449 Chronic obstructive pulmonary disease, unspecified: Secondary | ICD-10-CM

## 2012-07-31 MED ORDER — BUDESONIDE-FORMOTEROL FUMARATE 160-4.5 MCG/ACT IN AERO: 2.0000 | INHALATION_SPRAY | Freq: Two times a day (BID) | RESPIRATORY_TRACT | Status: DC

## 2012-07-31 MED ORDER — BUDESONIDE-FORMOTEROL FUMARATE 160-4.5 MCG/ACT IN AERO
2.0000 | INHALATION_SPRAY | Freq: Two times a day (BID) | RESPIRATORY_TRACT | Status: DC
  Filled 2012-07-31: qty 6

## 2012-07-31 MED ORDER — AZITHROMYCIN 250 MG PO TABS
250.0000 mg | ORAL_TABLET | Freq: Once | ORAL | Status: AC
  Administered 2012-07-31: 250 mg via ORAL
  Filled 2012-07-31: qty 1

## 2012-07-31 MED ORDER — ALBUTEROL SULFATE HFA 108 (90 BASE) MCG/ACT IN AERS: 2.0000 | INHALATION_SPRAY | RESPIRATORY_TRACT | Status: DC | PRN

## 2012-07-31 MED ORDER — PREDNISONE 20 MG PO TABS: 40.0000 mg | ORAL_TABLET | Freq: Every day | ORAL | Status: AC

## 2012-07-31 MED ORDER — ALBUTEROL SULFATE HFA 108 (90 BASE) MCG/ACT IN AERS
2.0000 | INHALATION_SPRAY | Freq: Four times a day (QID) | RESPIRATORY_TRACT | Status: DC
  Filled 2012-07-31: qty 6.7

## 2012-07-31 NOTE — Progress Notes (Signed)
Up ambulating in hall with mask on room air O2 Sat 87-91%, c/o mild SOB, no chest pain.  Had to take one rest break during walk for patient to "Caught my breath".   Barnett Applebaum, RN [facility], BSN, MSN

## 2012-07-31 NOTE — Progress Notes (Signed)
Subjective:    Interval Events:  Patient feels okay but is about the same as yesterday.  Mild dyspnea.  Continues coughing, producing clear sputum.    Objective:    Vital Signs:   Filed Vitals:   07/30/12 1713 07/30/12 2027 07/31/12 0621 07/31/12 0856  BP: 142/96 121/83 134/77 130/69  Pulse: 84 72 76 85  Temp: 99.2 F (37.3 C) 98.8 F (37.1 C) 98.2 F (36.8 C) 98.3 F (36.8 C)  TempSrc: Oral Oral Oral Oral  Resp: 18 18 18 18   Height:  5\' 6"  (1.676 m)    Weight: 181 lb 7 oz (82.3 kg) 181 lb 7 oz (82.3 kg)    SpO2: 90% 93% 95% 96%    Weights: 24-hour Weight change: -1 lb 12.2 oz (-0.8 kg)  Filed Weights   07/29/12 2023 07/30/12 1713 07/30/12 2027  Weight: 183 lb 3.2 oz (83.1 kg) 181 lb 7 oz (82.3 kg) 181 lb 7 oz (82.3 kg)    Intake/Output:   Gross per 24 hour  Intake   2070 ml  Output   1300 ml  Net    770 ml    Last BM Date: 07/30/12   Physical Exam: GENERAL: alert and oriented; resting comfortably in bed and in no distress LUNGS: wheezing throughout HEART: normal rate; regular rhythm ABDOMEN: soft, non-tender, normal bowel sounds, no masses palpated    Labs: Microbiology: Results for orders placed during the hospital encounter of 07/28/12  CULTURE, BLOOD (ROUTINE X 2)     Status: None   Collection Time    07/29/12 12:35 AM      Result Value Range Status   Specimen Description BLOOD RIGHT HAND   Final   Special Requests BOTTLES DRAWN AEROBIC ONLY 10CC   Final   Culture  Setup Time 07/29/2012 07:30   Final   Culture     Final   Value:        BLOOD CULTURE RECEIVED NO GROWTH TO DATE CULTURE WILL BE HELD FOR 5 DAYS BEFORE ISSUING A FINAL NEGATIVE REPORT   Report Status PENDING   Incomplete  CULTURE, BLOOD (ROUTINE X 2)     Status: None   Collection Time    07/29/12 12:37 AM      Result Value Range Status   Specimen Description BLOOD RIGHT FOREARM   Final   Special Requests BOTTLES DRAWN AEROBIC ONLY 10CC   Final   Culture  Setup Time 07/29/2012  07:30   Final   Culture     Final   Value:        BLOOD CULTURE RECEIVED NO GROWTH TO DATE CULTURE WILL BE HELD FOR 5 DAYS BEFORE ISSUING A FINAL NEGATIVE REPORT   Report Status PENDING   Incomplete     Medications:    Infusions:     Scheduled Medications: . azithromycin  250 mg Oral Once  . budesonide-formoterol  2 puff Inhalation BID  . heparin  5,000 Units Subcutaneous Q8H  . oseltamivir  75 mg Oral BID  . predniSONE  40 mg Oral Q breakfast  . sodium chloride  3 mL Intravenous Q12H     PRN Medications: acetaminophen, albuterol, ipratropium, oxyCODONE    Assessment/ Plan:    1.   Pneumonitis:  Most likely viral pneumonia from influenza B.  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 We are still giving azithromycin, continue oseltamivir, and continue prednisone. - Continue azithromycin 250 mg PO daily (day 3, day 4 of abx) - Continue prednisone 40 mg  PO daily - Continue oseltamivir 75 mg BID (3/5) - Continue scheduled albuterol and ipratropium - Check saturations with ambulation  2.   Influenza B:  PCR positive for influenza B.  On oseltamivir.  3.   AKI with CKD stage II:  Baseline creatinine around 1.3. Came in elevated at 1.5. Down to 1.18 on 4/22.   4.   COPD:  No PFTs on file.  Most likely has COPD and this is probably an exacerbation. - Switch fluticasone to budesonide-formoterol 2 puffs BID  5.   Thrombocytopenia:  Platelets 125 at admission.  Has trended to 108 and now 102.  Previously they were in the 200s in March 2012. HIV negative.  Peripheral smear unremarkable.  6.   Hypocalcemia:  Calcium low at 8.2 but this corrects to 9.2. Phosphorus is normal at 2.4. Magnesium is low-normal at 1.7.   7.   Hyponatremia:  Serum osmolality was calculated at 273, indicating hypotonic hyponatremia. Urine sodium was 157 with a calculated FENa of 1.04%. Sodium has now normalized to 135 with IV hydration, making SIADH unlikely and dehydration the most likely etiology.  8.    Prophylaxis: Heparin [place]   9.   Disposition:  No PCP. Follow up appt in [facility] made. Expected length of stay is < 2 more nights. No transportation limitations.  May need home O2.    Length of Stay: 3 days   Signed by:  Dorthula Rue. Earlene Plater, MD PGY-I, Internal Medicine Pager [phone]  07/31/2012, 10:52 AM

## 2012-07-31 NOTE — Progress Notes (Signed)
INTERNAL MEDICINE TEACHING SERVICE Attending Note  Date: 07/31/2012  Patient name: [name] [name]  Medical record number: [id number]  Date of birth: [birth year]    This patient has been seen and discussed with the house staff. Please see their note for complete details. I concur with their findings with the following additions/corrections: States he only felt slightly SOB when walking yesterday, though he was noted to desaturate into 80% range.  This may represent a COPD exacerbation triggered by viral infection (Flu?).  I would treat him with a course of prednisone for at least 5 days and finish course of azithromycin. I do not see any benefit to antiviral treatment at this time. He should be discharged on albuterol and Symbicort with F/U as outpatient with PFTs.  He denies any further symptoms when walking today except for briefly having to catch his breath.  I suspect this will improve over next few days. He can be discharged with f/u in our clinic within 1 week.   The treatment plan was discussed in detail with the patient.  Alternatives to treatment, side effects, risks and benefits, and complications were discussed with the patient. Informed consent was obtained. The patient agrees to proceed with the current treatment plan.  Jonah Blue, DO  07/31/2012, 3:06 PM

## 2012-07-31 NOTE — Discharge Summary (Signed)
Patient Name:  [name] [name] MRN: [id number]  PCP: No Pcp Per Patient DOB:  1960/09/30       Date of Admission:  07/28/2012  Date of Discharge:  07/31/2012      Attending Physician: Jonah Blue, DO         DISCHARGE DIAGNOSES: 1.   Influenza B with pneumonia 2.   AKI with CKD stage II 3.   COPD 4.   Thrombocytopenia 5.   Hypocalcemia 6.   Hyponatremia 7.   Cocaine abuse    DISPOSITION AND FOLLOW-UP: [name] [name] is to follow-up with the listed providers as detailed below, at which time, the following should be addressed:  1. Follow-up visits: 1. COPD:  Pt will need PFTs once stable. 2. SOCIAL:  Pt will need orange card and assistance with meds  2. Labs and images needed: 1. PFTs  3. Pending labs and tests needing follow-up: 1. BLOOD CULTURES   Follow-up Information   Follow up with Almyra Deforest, MD On 08/08/2012. (INTERNAL MEDICINE - Your appointment is on Thursday, May 1st at 3:45.)    Contact information:   [address] [place] [address] [phone]      Discharge Orders   Future Appointments Provider Department Dept Phone   08/08/2012 3:45 PM Almyra Deforest, MD [facility] [phone]   Future Orders Complete By Expires     Call MD for:  difficulty breathing, headache or visual disturbances  As directed     Call MD for:  temperature >100.4  As directed     Diet - low sodium heart healthy  As directed     Increase activity slowly  As directed         DISCHARGE MEDICATIONS:   Medication List    STOP taking these medications       amoxicillin 500 MG capsule  Commonly known as:  AMOXIL      TAKE these medications       acetaminophen 500 MG tablet  Commonly known as:  TYLENOL  Take 500 mg by mouth every 6 (six) hours as needed for pain.     albuterol 108 (90 BASE) MCG/ACT inhaler  Commonly known as:  PROVENTIL HFA;VENTOLIN HFA  Inhale 2 puffs into the lungs every 4 (four) hours as needed. For shortness of  breath     budesonide-formoterol 160-4.5 MCG/ACT inhaler  Commonly known as:  SYMBICORT  Inhale 2 puffs into the lungs 2 (two) times daily.     predniSONE 20 MG tablet  Commonly known as:  DELTASONE  Take 2 tablets (40 mg total) by mouth daily with breakfast. Take for five more days.  Start taking on:  08/01/2012         CONSULTS:  NONE   PROCEDURES PERFORMED:  Dg Chest 2 View 07/28/2012 FINDINGS: Heart size upper normal.  Central vascular prominence. Left lung base and right mid lung opacity.  Background hyperinflation, peribronchial thickening / interstitial coarsening. Multilevel degenerative change.  No pleural effusion or pneumothorax. IMPRESSION: Left lung base and right mid lung opacity; scarring, atelectasis, or infiltrate.  Background hyperinflation/interstitial coarsening, most in keeping with COPD.   ADMISSION DATA: H&P:  52 y.o PMH asthma, gout. He does not follow consistently with PCP and used to go to [facility] 8 months ago.  He presents with symptoms of cough with clear sputum, congestion , sob (with rest and exertion), wheezing subjective fever, chills, myalgias, sweats since Thursday prior to admission. He tried Geneticist, molecular  plus, Halls, tylenol, and leftover Amoxicillin which he finished today without relief. He states he had Amoxicilin due to going to a doctor in [facility] for back pain 3 weeks ago and he was given Amoxicillin. He has been out of his Albuterol inhaler x 2 days. Other associated symptom includes h/a associated with coughing. He denies sick contacts but lives with grandchildren age 13 and 75, did not have flu shot this year. He was given Tylenol, Duoneb, Rocephin and AZM in the ED.   Physical Exam: Vitals: 94-95% on [place] 8 L 159/89 (105) HR 95  Blood pressure 159/89, pulse 94, temperature 101.8 F (38.8 C), temperature source Rectal, resp. rate 18, SpO2 93.00%.  General: resting in bed, nad, alert and oriented x 3  HEENT: PERRL, no  scleral icterus, poor oral dentition  Cardiac: RRR, no rubs, murmurs or gallops  Pulm: wheezing b/l, mild use of accessory muscles on 8-9L [place]  Abd: soft, nontender, nondistended, BS present, obese ab  Ext: warm and well perfused, no pedal edema, distal pulses intact  Neuro: alert and oriented X3, neurologically intact   Labs: Basic Metabolic Panel:   07/28/12 2123   NA  136   K  4.1   CL  97   GLUCOSE  91   BUN  13   CREATININE  1.50*      CBC:   07/28/12 2108  07/28/12 2123   WBC  4.5  --   NEUTROABS  2.4  --   HGB  14.9  17.7*   HCT  44.4  52.0   MCV  75.8*  --   PLT  125*  --      HOSPITAL COURSE: 1.   Influenza B with pneumonia:  Patient came in with dyspnea, cough producing clear sputum, an oxygen requirement, and several days of malaise.  PCR for flu positive for influenza B.  Pt treated empiracally with CTX for 2 days and azithromycin for 3 days (4 days of total abx). Also received 3 days of oseltamivir.  Started on prednisone and sent home with a short course of 40 mg.   2.   AKI with CKD stage II:  Baseline creatinine around 1.3. Came in elevated at 1.5. Down to 1.18 on 4/22.   3.   COPD:  No PFTs on file.  Was on albuterol only at home.  We presumptively treated for a mild COPD exacerbation triggered by flu.  Started on prednisone for a short course of 40 mg.  Sent home with a new albuterol MDI and a budesonide-formoterol MDI.  Will need PFTs once stable to confirm diagnosis.  4.   Thrombocytopenia:  Platelets 125 at admission. Has trended to 108 and now 102. Previously they were in the 200s in March 2012. HIV negative. Peripheral smear unremarkable.  5.   Hypocalcemia:  Calcium low at 8.2 but this corrects to 9.2. Phosphorus is normal at 2.4. Magnesium is low-normal at 1.7.  6.   Hyponatremia:  Serum osmolality was calculated at 273, indicating hypotonic hyponatremia. Urine sodium was 157 with a calculated FENa of 1.04%. Sodium has now normalized to 135 with IV  hydration, making SIADH unlikely and dehydration the most likely etiology.  Serum osmolality was calculated at 273, indicating hypotonic hyponatremia. Urine sodium was 157 with a calculated FENa of 1.04%. Sodium has now normalized to 135 with IV hydration, making SIADH unlikely and dehydration the most likely etiology.  7.   Cocaine abuse:  Pt smoked crack a day  or two prior to admission.  Prior to that, his last use was 2 months prior.  Advised pt to quite all together.     DISCHARGE DATA: Vital Signs: BP 130/69  Pulse 85  Temp(Src) 98.3 F (36.8 C) (Oral)  Resp 18  Ht 5\' 6"  (1.676 m)  Wt 181 lb 7 oz (82.3 kg)  BMI 29.3 kg/m2  SpO2 96%  Labs: Microbiology:  Results for orders placed during the hospital encounter of 07/28/12   CULTURE, BLOOD (ROUTINE X 2) Status: None    Collection Time    07/29/12 12:35 AM   Result  Value  Range  Status    Specimen Description  BLOOD RIGHT HAND   Final    Special Requests  BOTTLES DRAWN AEROBIC ONLY 10CC   Final    Culture Setup Time  07/29/2012 07:30   Final    Culture    Final    Value:  BLOOD CULTURE RECEIVED NO GROWTH TO DATE CULTURE WILL BE HELD FOR 5 DAYS BEFORE ISSUING A FINAL NEGATIVE REPORT    Report Status  PENDING   Incomplete   CULTURE, BLOOD (ROUTINE X 2) Status: None    Collection Time    07/29/12 12:37 AM   Result  Value  Range  Status    Specimen Description  BLOOD RIGHT FOREARM   Final    Special Requests  BOTTLES DRAWN AEROBIC ONLY 10CC   Final    Culture Setup Time  07/29/2012 07:30   Final    Culture    Final    Value:  BLOOD CULTURE RECEIVED NO GROWTH TO DATE CULTURE WILL BE HELD FOR 5 DAYS BEFORE ISSUING A FINAL NEGATIVE REPORT    Report Status  PENDING   Incomplete       Time spent on discharge: 32 minutes   Services Ordered on Discharge: 1. PT - no 2. OT - no 3. RN - no 4. Other - no   Signed by:  Dorthula Rue. Earlene Plater, MD PGY-I, Internal Medicine  08/03/2012, 2:02 PM

## 2012-08-03 DIAGNOSIS — N182 Chronic kidney disease, stage 2 (mild): Secondary | ICD-10-CM

## 2012-08-03 HISTORY — DX: Chronic kidney disease, stage 2 (mild): N18.2

## 2012-08-04 LAB — CULTURE, BLOOD (ROUTINE X 2): Culture: NO GROWTH

## 2012-08-05 NOTE — Discharge Summary (Signed)
INTERNAL MEDICINE TEACHING SERVICE Attending Note  Date: 08/05/2012  Patient name: [name] [name]  Medical record number: [id number]  Date of birth: 1960/12/14    This patient has been discussed with the house staff. Please see their note for complete details. I concur with their findings and plan.    The treatment plan was discussed in detail with the patient.  Alternatives to treatment, side effects, risks and benefits, and complications were discussed with the patient. Informed consent was obtained. The patient agrees to proceed with the current treatment plan.  Jonah Blue, DO  08/05/2012, 9:55 AM

## 2012-08-08 ENCOUNTER — Ambulatory Visit (INDEPENDENT_AMBULATORY_CARE_PROVIDER_SITE_OTHER): Payer: Self-pay | Admitting: Internal Medicine

## 2012-08-08 ENCOUNTER — Encounter: Payer: Self-pay | Admitting: Internal Medicine

## 2012-08-08 VITALS — BP 157/88 | HR 85 | Temp 98.9°F | Ht 70.0 in | Wt 184.1 lb

## 2012-08-08 DIAGNOSIS — J449 Chronic obstructive pulmonary disease, unspecified: Secondary | ICD-10-CM

## 2012-08-08 DIAGNOSIS — F17200 Nicotine dependence, unspecified, uncomplicated: Secondary | ICD-10-CM

## 2012-08-08 DIAGNOSIS — J189 Pneumonia, unspecified organism: Secondary | ICD-10-CM

## 2012-08-08 DIAGNOSIS — Z720 Tobacco use: Secondary | ICD-10-CM

## 2012-08-08 DIAGNOSIS — M109 Gout, unspecified: Secondary | ICD-10-CM | POA: Insufficient documentation

## 2012-08-08 DIAGNOSIS — IMO0001 Reserved for inherently not codable concepts without codable children: Secondary | ICD-10-CM

## 2012-08-08 MED ORDER — FLUTICASONE-SALMETEROL 100-50 MCG/DOSE IN AEPB: 1.0000 | INHALATION_SPRAY | Freq: Two times a day (BID) | RESPIRATORY_TRACT | Status: DC

## 2012-08-08 MED ORDER — ALBUTEROL SULFATE (2.5 MG/3ML) 0.083% IN NEBU
2.5000 mg | INHALATION_SOLUTION | Freq: Once | RESPIRATORY_TRACT | Status: AC
  Administered 2012-08-08: 2.5 mg via RESPIRATORY_TRACT

## 2012-08-08 NOTE — Patient Instructions (Addendum)
1. Please use the Symbicort until you are done. Then start to use Advair.  2. Albuterol inhaler is only a rescue inhaler  3. If you are experiencing worsening shortness of breath, cough or wheezing over more than 3 days please call the clinic for further evaluation and management

## 2012-08-08 NOTE — Assessment & Plan Note (Signed)
Patient had been smoking one pack a day and is down to 2 cigarettes a day. He is trying hard to quit smoking.

## 2012-08-08 NOTE — Assessment & Plan Note (Signed)
Possible history of COPD. No PFT in records. Patient has no insurance therefore we will not able to refer patient to receive PFT at this point. Awaiting orange card approval. Patient currently still has Symbicort and albuterol inhaler. I informed the patient as soon as he has completed Symbicort he can start Advair. An Advair inhaler was provided from the clinic since patient has no insurance. Patient was informed that he needs to be evaluated in the clinic as soon as possible if he's experiencing worsening shortness of breath, cough or wheezing over more than 3 days.

## 2012-08-08 NOTE — Progress Notes (Signed)
Subjective:   Patient ID: [name] [name] male   DOB: 12-Jun-1960 52 y.o.   MRN: [id number]  HPI: [name] is a 52 y.o. male with past medical history significant for asthma, questionable COPD ( no PFT), Gout ( last Uric acid 8.3 (05/2011), currently on no medication) who presented for the first time in the clinic for a hospital followup. Patient was admitted for influenza B. with pneumonia as well as acute on chronic renal insufficiency.  Patient reports he has been able to get Symbicort and albuterol. He has been using the albuterol inhaler at least every 4-6 hours day since the last 2 days. Is taking this Symbicort on a daily basis. Patient has cut down smoking from one pack a day to 2 cigarettes a day. He denies any cocaine use since being discharged from the hospital.  He has not yet obtained any documents to apply for the orange card.     Past Medical History  Diagnosis Date  . Asthma   . Gout   . 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 Thrombocytopenia 07/28/2012  . Tobacco abuse 07/29/2012  . Influenza with respiratory manifestations 07/30/2012   Current Outpatient Prescriptions  Medication Sig Dispense Refill  . acetaminophen (TYLENOL) 500 MG tablet Take 500 mg by mouth every 6 (six) hours as needed for pain.      Marland Kitchen albuterol (PROVENTIL HFA;VENTOLIN HFA) 108 (90 BASE) MCG/ACT inhaler Inhale 2 puffs into the lungs every 4 (four) hours as needed. For shortness of breath      . budesonide-formoterol (SYMBICORT) 160-4.5 MCG/ACT inhaler Inhale 2 puffs into the lungs 2 (two) times daily.  1 Inhaler  0  . Fluticasone-Salmeterol (ADVAIR DISKUS) 100-50 MCG/DOSE AEPB Inhale 1 puff into the lungs 2 (two) times daily.      . [DISCONTINUED] ALBUTEROL IN Inhale into the lungs.       No current facility-administered medications for this visit.   Family History  Problem Relation Age of Onset  . Colon cancer      mother ? age of diagnosis    History   Social History  . Marital  Status: Legally Separated    Spouse Name: N/A    Number of Children: N/A  . Years of Education: N/A   Social History Main Topics  . Smoking status: Current Every Day Smoker -- 0.20 packs/day  . Smokeless tobacco: None     Comment: 'Quitting"  . Alcohol Use: Yes     Comment: Beer - 12oz daily  . Drug Use: No  . Sexually Active: None   Other Topics Concern  . None   Social History Narrative   07/28/12    Lives with daughter and grandkids x 2. 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 Denies liquor, wine. History of cocaine use. Denies IVDU   Has an orange card    Sexually active with 1 partner uses protection             Review of Systems: Constitutional: Denies fever, chills, diaphoresis, appetite change and fatigue.  HEENT: Denies congestion, sore throat, rhinorrhea, sneezing, mouth sores, Respiratory: Noted SOB, DOE, cough, chest tightness,  and wheezing.   Cardiovascular: Denies chest pain, palpitations and leg swelling.  Gastrointestinal: Denies nausea, vomiting, abdominal pain, diarrhea, constipation, blood in stool and abdominal distention.  Genitourinary: Denies dysuria, urgency, frequency, hematuria, flank pain and difficulty urinating.  Endocrine: Denies: hot or cold intolerance, sweats, changes in hair or nails, polyuria, polydipsia. Skin: Denies pallor, rash and wound.  Neurological: Denies dizziness, weakness, light-headedness, numbness and headaches.  Hematological: Denies adenopathy.  Objective:  Physical Exam: Filed Vitals:   08/08/12 1558  BP: 157/88  Pulse: 85  Temp: 98.9 F (37.2 C)  TempSrc: Oral  Height: 5\' 10"  (1.778 m)  Weight: 184 lb 1.6 oz (83.507 kg)  SpO2: 93%   Constitutional: Vital signs reviewed.  Patient is a well-developed and well-nourished male in no acute distress and cooperative with exam. Alert and oriented x3.  Ear:  TM normal bilaterally Mouth: no erythema or exudates, MMM Eyes: PERRL, EOMI, conjunctivae normal, No scleral icterus.  Neck: Supple.  Cardiovascular: RRR, S1 normal, S2 normal, no MRG, pulses symmetric and intact bilaterally Pulmonary/Chest: Decreased air movement, wheezes present but no rhonchi or rales.  Abdominal: Soft. Non-tender, non-distended, bowel sounds are normal,  Hematology: no cervical adenopathy.  Neurological: A&O x3, Strength is normal and symmetric bilaterally,  no focal motor deficit, sensory intact to light touch bilaterally.  Skin: Warm, dry and intact. No rash, cyanosis, or clubbing.

## 2012-08-14 NOTE — Progress Notes (Signed)
Case discussed with Dr. Illath soon after being seen by the resident. We reviewed the resident's history and exam and pertinent patient test results. I agree with the assessment, diagnosis and plan of care documented in the resident's note. 

## 2012-08-23 ENCOUNTER — Ambulatory Visit: Payer: Self-pay

## 2012-08-23 ENCOUNTER — Ambulatory Visit: Payer: Self-pay | Admitting: Internal Medicine

## 2012-08-23 ENCOUNTER — Encounter: Payer: Self-pay | Admitting: General Practice

## 2012-09-03 ENCOUNTER — Ambulatory Visit: Payer: Self-pay | Admitting: Internal Medicine

## 2012-12-11 ENCOUNTER — Encounter: Payer: Self-pay | Admitting: Internal Medicine

## 2013-03-02 IMAGING — CR DG CHEST 1V PORT
1 series · 1 of 1 positions shown · non-contrast
Comparison: 06/29/2010earlier the same day

CLINICAL DATA: Shortness of breath.

PORTABLE CHEST - 1 VIEW

[view not recorded]
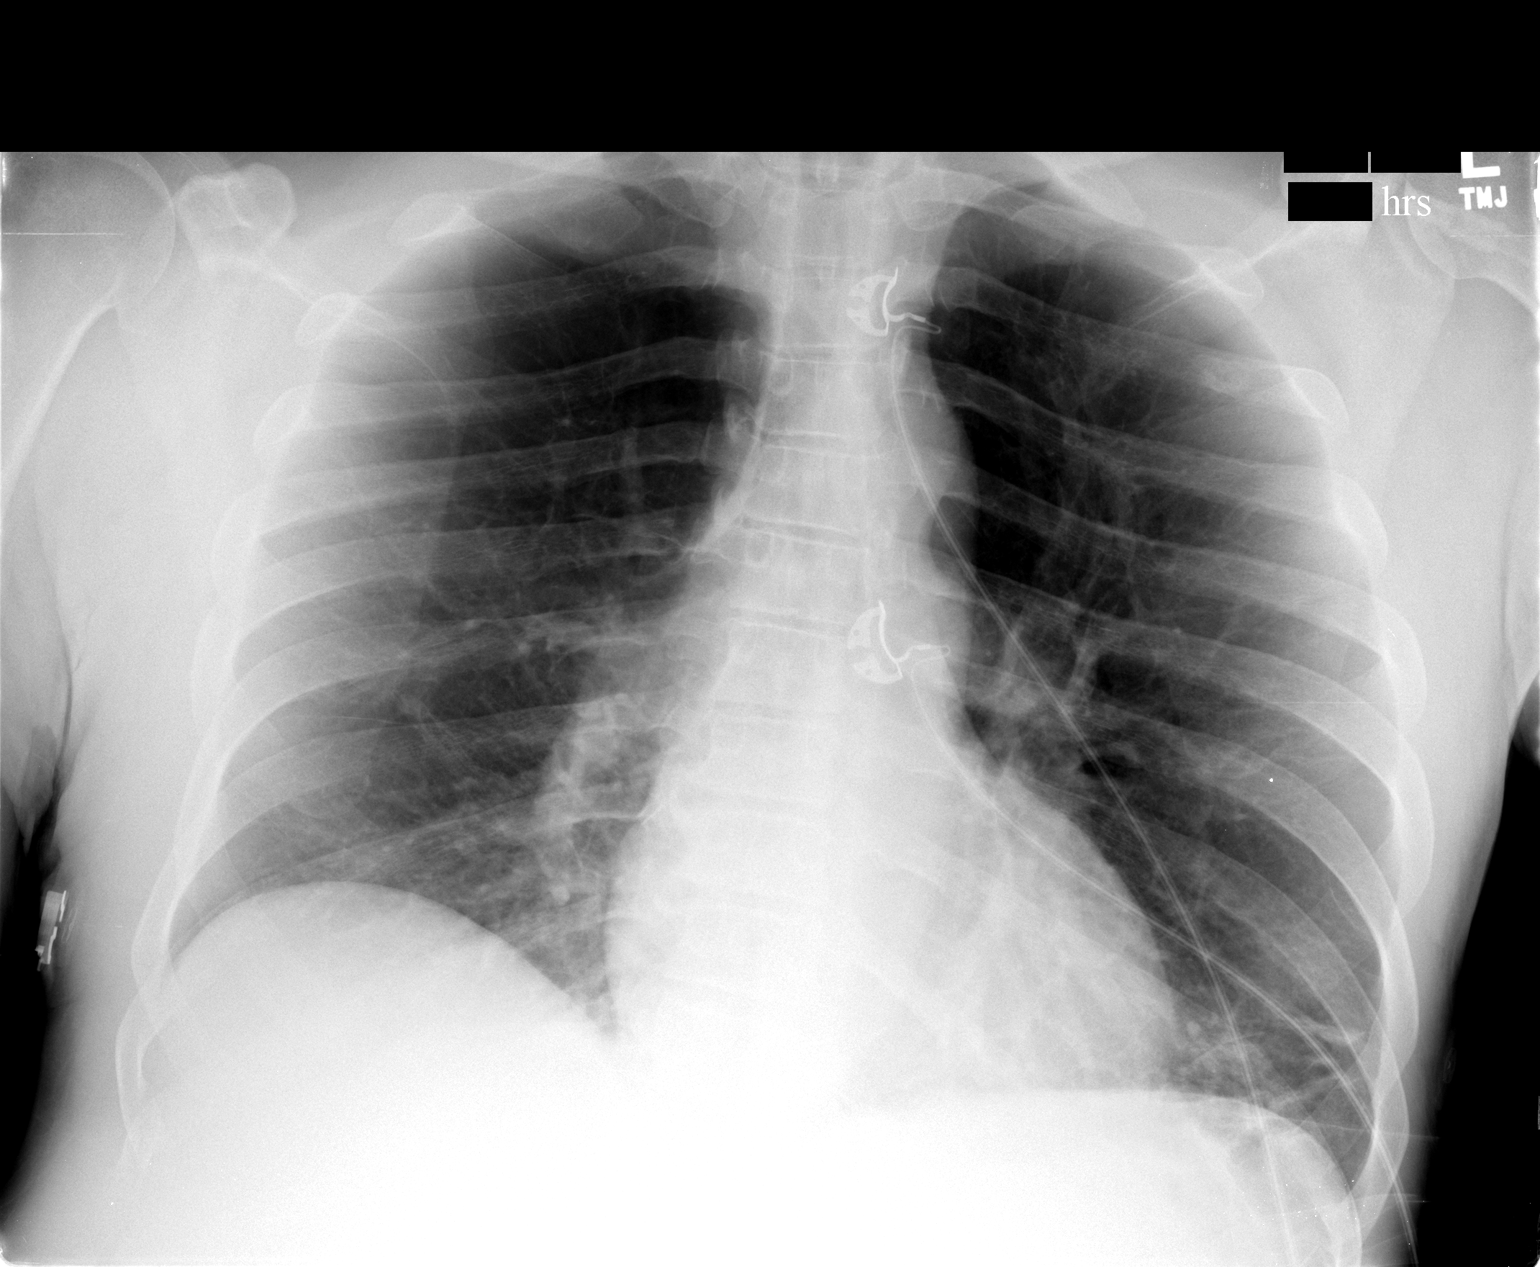

[1 of 1 positions shown; findings below may reference images not displayed]

FINDINGS: 9952 hours.  Right lung is clear.  There is some
subsegmental atelectasis in the left lung base. The
cardiopericardial silhouette is within normal limits for size.
Telemetry leads overlie the chest.
IMPRESSION: Left base atelectasis.

## 2013-12-11 ENCOUNTER — Inpatient Hospital Stay (HOSPITAL_COMMUNITY): Payer: MEDICAID

## 2013-12-11 ENCOUNTER — Encounter (HOSPITAL_COMMUNITY): Payer: Self-pay | Admitting: Emergency Medicine

## 2013-12-11 ENCOUNTER — Emergency Department (HOSPITAL_COMMUNITY): Payer: Self-pay

## 2013-12-11 ENCOUNTER — Inpatient Hospital Stay (HOSPITAL_COMMUNITY)
Admission: EM | Admit: 2013-12-11 | Discharge: 2013-12-15 | DRG: 190 | Disposition: A | Discharge status: Home / Self Care | Payer: Self-pay | Attending: Internal Medicine | Admitting: Internal Medicine

## 2013-12-11 DIAGNOSIS — I498 Other specified cardiac arrhythmias: Secondary | ICD-10-CM | POA: Diagnosis present

## 2013-12-11 DIAGNOSIS — F17201 Nicotine dependence, unspecified, in remission: Secondary | ICD-10-CM | POA: Diagnosis present

## 2013-12-11 DIAGNOSIS — I5032 Chronic diastolic (congestive) heart failure: Secondary | ICD-10-CM | POA: Diagnosis present

## 2013-12-11 DIAGNOSIS — E46 Unspecified protein-calorie malnutrition: Secondary | ICD-10-CM | POA: Diagnosis present

## 2013-12-11 DIAGNOSIS — I5021 Acute systolic (congestive) heart failure: Secondary | ICD-10-CM

## 2013-12-11 DIAGNOSIS — N179 Acute kidney failure, unspecified: Secondary | ICD-10-CM | POA: Diagnosis present

## 2013-12-11 DIAGNOSIS — T380X5A Adverse effect of glucocorticoids and synthetic analogues, initial encounter: Secondary | ICD-10-CM | POA: Diagnosis present

## 2013-12-11 DIAGNOSIS — J45901 Unspecified asthma with (acute) exacerbation: Principal | ICD-10-CM

## 2013-12-11 DIAGNOSIS — R0602 Shortness of breath: Secondary | ICD-10-CM

## 2013-12-11 DIAGNOSIS — J9600 Acute respiratory failure, unspecified whether with hypoxia or hypercapnia: Secondary | ICD-10-CM

## 2013-12-11 DIAGNOSIS — I472 Ventricular tachycardia, unspecified: Secondary | ICD-10-CM | POA: Diagnosis not present

## 2013-12-11 DIAGNOSIS — J9601 Acute respiratory failure with hypoxia: Secondary | ICD-10-CM | POA: Diagnosis present

## 2013-12-11 DIAGNOSIS — I272 Pulmonary hypertension, unspecified: Secondary | ICD-10-CM | POA: Diagnosis present

## 2013-12-11 DIAGNOSIS — J189 Pneumonia, unspecified organism: Secondary | ICD-10-CM

## 2013-12-11 DIAGNOSIS — G9340 Encephalopathy, unspecified: Secondary | ICD-10-CM | POA: Diagnosis present

## 2013-12-11 DIAGNOSIS — J9620 Acute and chronic respiratory failure, unspecified whether with hypoxia or hypercapnia: Secondary | ICD-10-CM | POA: Diagnosis present

## 2013-12-11 DIAGNOSIS — M109 Gout, unspecified: Secondary | ICD-10-CM | POA: Diagnosis present

## 2013-12-11 DIAGNOSIS — F1410 Cocaine abuse, uncomplicated: Secondary | ICD-10-CM | POA: Diagnosis present

## 2013-12-11 DIAGNOSIS — I4729 Other ventricular tachycardia: Secondary | ICD-10-CM | POA: Diagnosis not present

## 2013-12-11 DIAGNOSIS — J9602 Acute respiratory failure with hypercapnia: Secondary | ICD-10-CM

## 2013-12-11 DIAGNOSIS — F17200 Nicotine dependence, unspecified, uncomplicated: Secondary | ICD-10-CM | POA: Diagnosis present

## 2013-12-11 DIAGNOSIS — I509 Heart failure, unspecified: Secondary | ICD-10-CM | POA: Diagnosis present

## 2013-12-11 DIAGNOSIS — J441 Chronic obstructive pulmonary disease with (acute) exacerbation: Principal | ICD-10-CM | POA: Diagnosis present

## 2013-12-11 DIAGNOSIS — N182 Chronic kidney disease, stage 2 (mild): Secondary | ICD-10-CM | POA: Diagnosis present

## 2013-12-11 DIAGNOSIS — F1411 Cocaine abuse, in remission: Secondary | ICD-10-CM | POA: Diagnosis present

## 2013-12-11 DIAGNOSIS — IMO0001 Reserved for inherently not codable concepts without codable children: Secondary | ICD-10-CM

## 2013-12-11 DIAGNOSIS — I2789 Other specified pulmonary heart diseases: Secondary | ICD-10-CM | POA: Diagnosis present

## 2013-12-11 DIAGNOSIS — R609 Edema, unspecified: Secondary | ICD-10-CM

## 2013-12-11 DIAGNOSIS — Z720 Tobacco use: Secondary | ICD-10-CM

## 2013-12-11 DIAGNOSIS — I129 Hypertensive chronic kidney disease with stage 1 through stage 4 chronic kidney disease, or unspecified chronic kidney disease: Secondary | ICD-10-CM | POA: Diagnosis present

## 2013-12-11 DIAGNOSIS — I10 Essential (primary) hypertension: Secondary | ICD-10-CM | POA: Diagnosis present

## 2013-12-11 DIAGNOSIS — D72829 Elevated white blood cell count, unspecified: Secondary | ICD-10-CM | POA: Diagnosis present

## 2013-12-11 DIAGNOSIS — N183 Chronic kidney disease, stage 3 unspecified: Secondary | ICD-10-CM | POA: Diagnosis present

## 2013-12-11 DIAGNOSIS — R7309 Other abnormal glucose: Secondary | ICD-10-CM | POA: Diagnosis present

## 2013-12-11 DIAGNOSIS — D696 Thrombocytopenia, unspecified: Secondary | ICD-10-CM | POA: Diagnosis present

## 2013-12-11 LAB — URINALYSIS, ROUTINE W REFLEX MICROSCOPIC
Bilirubin Urine: NEGATIVE
GLUCOSE, UA: NEGATIVE mg/dL
Hgb urine dipstick: NEGATIVE
KETONES UR: NEGATIVE mg/dL
LEUKOCYTES UA: NEGATIVE
Nitrite: NEGATIVE
PROTEIN: NEGATIVE mg/dL
Specific Gravity, Urine: 1.011 (ref 1.005–1.030)
UROBILINOGEN UA: 0.2 mg/dL (ref 0.0–1.0)
pH: 5 (ref 5.0–8.0)

## 2013-12-11 LAB — POCT I-STAT 3, ART BLOOD GAS (G3+)
ACID-BASE EXCESS: 8 mmol/L — AB (ref 0.0–2.0)
Bicarbonate: 40.8 mEq/L — ABNORMAL HIGH (ref 20.0–24.0)
O2 Saturation: 92 %
TCO2: 43 mmol/L (ref 0–100)
pCO2 arterial: 85.2 mmHg (ref 35.0–45.0)
pH, Arterial: 7.286 — ABNORMAL LOW (ref 7.350–7.450)
pO2, Arterial: 74 mmHg — ABNORMAL LOW (ref 80.0–100.0)

## 2013-12-11 LAB — COMPREHENSIVE METABOLIC PANEL
ALT: 28 U/L (ref 0–53)
AST: 24 U/L (ref 0–37)
Albumin: 2.8 g/dL — ABNORMAL LOW (ref 3.5–5.2)
Alkaline Phosphatase: 88 U/L (ref 39–117)
Anion gap: 14 (ref 5–15)
BUN: 17 mg/dL (ref 6–23)
CO2: 29 meq/L (ref 19–32)
Calcium: 8 mg/dL — ABNORMAL LOW (ref 8.4–10.5)
Chloride: 99 mEq/L (ref 96–112)
Creatinine, Ser: 1.54 mg/dL — ABNORMAL HIGH (ref 0.50–1.35)
GFR calc Af Amer: 57 mL/min — ABNORMAL LOW (ref >90)
GFR, EST NON AFRICAN AMERICAN: 50 mL/min — AB (ref >90)
Glucose, Bld: 177 mg/dL — ABNORMAL HIGH (ref 70–99)
Potassium: 4.1 mEq/L (ref 3.7–5.3)
Sodium: 142 mEq/L (ref 137–147)
Total Bilirubin: 0.4 mg/dL (ref 0.3–1.2)
Total Protein: 6.5 g/dL (ref 6.0–8.3)

## 2013-12-11 LAB — HEPARIN LEVEL (UNFRACTIONATED): Heparin Unfractionated: 0.39 IU/mL (ref 0.30–0.70)

## 2013-12-11 LAB — CBC WITH DIFFERENTIAL/PLATELET
Basophils Absolute: 0 10*3/uL (ref 0.0–0.1)
Basophils Relative: 0 % (ref 0–1)
Eosinophils Absolute: 0.1 10*3/uL (ref 0.0–0.7)
Eosinophils Relative: 2 % (ref 0–5)
HCT: 54.5 % — ABNORMAL HIGH (ref 39.0–52.0)
HEMOGLOBIN: 17.3 g/dL — AB (ref 13.0–17.0)
LYMPHS ABS: 1.8 10*3/uL (ref 0.7–4.0)
LYMPHS PCT: 28 % (ref 12–46)
MCH: 25.4 pg — ABNORMAL LOW (ref 26.0–34.0)
MCHC: 31.7 g/dL (ref 30.0–36.0)
MCV: 80 fL (ref 78.0–100.0)
MONOS PCT: 24 % — AB (ref 3–12)
Monocytes Absolute: 1.5 10*3/uL — ABNORMAL HIGH (ref 0.1–1.0)
NEUTROS ABS: 3 10*3/uL (ref 1.7–7.7)
NEUTROS PCT: 46 % (ref 43–77)
Platelets: 165 10*3/uL (ref 150–400)
RBC: 6.81 MIL/uL — AB (ref 4.22–5.81)
RDW: 14.3 % (ref 11.5–15.5)
WBC: 6.5 10*3/uL (ref 4.0–10.5)

## 2013-12-11 LAB — RAPID URINE DRUG SCREEN, HOSP PERFORMED
AMPHETAMINES: NOT DETECTED
BARBITURATES: NOT DETECTED
BENZODIAZEPINES: NOT DETECTED
COCAINE: NOT DETECTED
OPIATES: NOT DETECTED
Tetrahydrocannabinol: NOT DETECTED

## 2013-12-11 LAB — MRSA PCR SCREENING: MRSA by PCR: NEGATIVE

## 2013-12-11 LAB — GLUCOSE, CAPILLARY
GLUCOSE-CAPILLARY: 218 mg/dL — AB (ref 70–99)
Glucose-Capillary: 101 mg/dL — ABNORMAL HIGH (ref 70–99)

## 2013-12-11 LAB — I-STAT TROPONIN, ED: TROPONIN I, POC: 0.03 ng/mL (ref 0.00–0.08)

## 2013-12-11 LAB — I-STAT VENOUS BLOOD GAS, ED
Acid-Base Excess: 3 mmol/L — ABNORMAL HIGH (ref 0.0–2.0)
Bicarbonate: 35.9 mEq/L — ABNORMAL HIGH (ref 20.0–24.0)
O2 Saturation: 97 %
PCO2 VEN: 88.1 mmHg — AB (ref 45.0–50.0)
PH VEN: 7.219 — AB (ref 7.250–7.300)
TCO2: 39 mmol/L (ref 0–100)
pO2, Ven: 115 mmHg — ABNORMAL HIGH (ref 30.0–45.0)

## 2013-12-11 LAB — BLOOD GAS, ARTERIAL
Acid-Base Excess: 9.7 mmol/L — ABNORMAL HIGH (ref 0.0–2.0)
Bicarbonate: 37.8 mEq/L — ABNORMAL HIGH (ref 20.0–24.0)
DRAWN BY: 27733
FIO2: 1 %
O2 Saturation: 99.4 %
PCO2 ART: 102 mmHg — AB (ref 35.0–45.0)
PO2 ART: 159 mmHg — AB (ref 80.0–100.0)
Patient temperature: 97.7
TCO2: 41 mmol/L (ref 0–100)
pH, Arterial: 7.193 — CL (ref 7.350–7.450)

## 2013-12-11 LAB — BASIC METABOLIC PANEL
Anion gap: 11 (ref 5–15)
BUN: 19 mg/dL (ref 6–23)
CHLORIDE: 100 meq/L (ref 96–112)
CO2: 29 meq/L (ref 19–32)
Calcium: 8 mg/dL — ABNORMAL LOW (ref 8.4–10.5)
Creatinine, Ser: 1.67 mg/dL — ABNORMAL HIGH (ref 0.50–1.35)
GFR calc non Af Amer: 45 mL/min — ABNORMAL LOW (ref >90)
GFR, EST AFRICAN AMERICAN: 52 mL/min — AB (ref >90)
GLUCOSE: 110 mg/dL — AB (ref 70–99)
POTASSIUM: 5 meq/L (ref 3.7–5.3)
Sodium: 140 mEq/L (ref 137–147)

## 2013-12-11 LAB — HIV ANTIBODY (ROUTINE TESTING W REFLEX): HIV: NONREACTIVE

## 2013-12-11 LAB — TROPONIN I
Troponin I: <0.3 ng/mL (ref <0.30)
Troponin I: <0.3 ng/mL (ref <0.30)
Troponin I: <0.3 ng/mL (ref <0.30)

## 2013-12-11 LAB — PRO B NATRIURETIC PEPTIDE: Pro B Natriuretic peptide (BNP): 2989 pg/mL — ABNORMAL HIGH (ref 0–125)

## 2013-12-11 LAB — D-DIMER, QUANTITATIVE: D-Dimer, Quant: 0.56 ug/mL-FEU — ABNORMAL HIGH (ref 0.00–0.48)

## 2013-12-11 MED ORDER — ACETAMINOPHEN 325 MG PO TABS
650.0000 mg | ORAL_TABLET | Freq: Four times a day (QID) | ORAL | Status: DC | PRN
Start: 2013-12-11 — End: 2013-12-15

## 2013-12-11 MED ORDER — FUROSEMIDE 10 MG/ML IJ SOLN
40.0000 mg | Freq: Once | INTRAMUSCULAR | Status: AC
  Administered 2013-12-11: 40 mg via INTRAVENOUS
  Filled 2013-12-11: qty 4

## 2013-12-11 MED ORDER — SODIUM CHLORIDE 0.9 % IV SOLN: 250.0000 mL | INTRAVENOUS | Status: DC | PRN

## 2013-12-11 MED ORDER — HEPARIN BOLUS VIA INFUSION
4000.0000 [IU] | Freq: Once | INTRAVENOUS | Status: AC
  Administered 2013-12-11: 4000 [IU] via INTRAVENOUS
  Filled 2013-12-11: qty 4000

## 2013-12-11 MED ORDER — ASPIRIN 325 MG PO TABS
325.0000 mg | ORAL_TABLET | Freq: Once | ORAL | Status: AC
  Administered 2013-12-11: 325 mg via ORAL
  Filled 2013-12-11: qty 1

## 2013-12-11 MED ORDER — IPRATROPIUM BROMIDE 0.02 % IN SOLN: 0.5000 mg | RESPIRATORY_TRACT | Status: DC

## 2013-12-11 MED ORDER — HEPARIN (PORCINE) IN NACL 100-0.45 UNIT/ML-% IJ SOLN
1450.0000 [IU]/h | INTRAMUSCULAR | Status: DC
  Administered 2013-12-12: 1450 [IU]/h via INTRAVENOUS
  Filled 2013-12-11: qty 250

## 2013-12-11 MED ORDER — MAGNESIUM SULFATE 40 MG/ML IJ SOLN
2.0000 g | Freq: Once | INTRAMUSCULAR | Status: AC
  Administered 2013-12-11: 2 g via INTRAVENOUS
  Filled 2013-12-11: qty 50

## 2013-12-11 MED ORDER — IPRATROPIUM BROMIDE 0.02 % IN SOLN
0.5000 mg | Freq: Four times a day (QID) | RESPIRATORY_TRACT | Status: DC
  Administered 2013-12-11: 0.5 mg via RESPIRATORY_TRACT
  Filled 2013-12-11: qty 2.5

## 2013-12-11 MED ORDER — ALBUTEROL SULFATE (2.5 MG/3ML) 0.083% IN NEBU: 2.5000 mg | INHALATION_SOLUTION | RESPIRATORY_TRACT | Status: DC | PRN

## 2013-12-11 MED ORDER — METHYLPREDNISOLONE SODIUM SUCC 125 MG IJ SOLR
60.0000 mg | Freq: Four times a day (QID) | INTRAMUSCULAR | Status: DC
  Administered 2013-12-11: 06:00:00 via INTRAVENOUS
  Administered 2013-12-11: 60 mg via INTRAVENOUS
  Filled 2013-12-11: qty 2
  Filled 2013-12-11 (×4): qty 0.96

## 2013-12-11 MED ORDER — ALBUTEROL SULFATE (2.5 MG/3ML) 0.083% IN NEBU
2.5000 mg | INHALATION_SOLUTION | RESPIRATORY_TRACT | Status: DC
  Administered 2013-12-11: 2.5 mg via RESPIRATORY_TRACT
  Filled 2013-12-11: qty 3

## 2013-12-11 MED ORDER — FUROSEMIDE 10 MG/ML IJ SOLN: 40.0000 mg | Freq: Two times a day (BID) | INTRAMUSCULAR | Status: DC

## 2013-12-11 MED ORDER — SODIUM CHLORIDE 0.9 % IV BOLUS (SEPSIS): 1000.0000 mL | Freq: Once | INTRAVENOUS | Status: DC

## 2013-12-11 MED ORDER — SODIUM CHLORIDE 0.9 % IJ SOLN
3.0000 mL | Freq: Two times a day (BID) | INTRAMUSCULAR | Status: DC
  Administered 2013-12-11 – 2013-12-15 (×7): 3 mL via INTRAVENOUS

## 2013-12-11 MED ORDER — HEPARIN SODIUM (PORCINE) 5000 UNIT/ML IJ SOLN
5000.0000 [IU] | Freq: Three times a day (TID) | INTRAMUSCULAR | Status: DC
  Administered 2013-12-11: 5000 [IU] via SUBCUTANEOUS
  Filled 2013-12-11 (×3): qty 1

## 2013-12-11 MED ORDER — METHYLPREDNISOLONE SODIUM SUCC 125 MG IJ SOLR
80.0000 mg | Freq: Three times a day (TID) | INTRAMUSCULAR | Status: DC
  Administered 2013-12-11 – 2013-12-13 (×5): 80 mg via INTRAVENOUS
  Filled 2013-12-11 (×8): qty 1.28

## 2013-12-11 MED ORDER — SODIUM CHLORIDE 0.9 % IJ SOLN: 3.0000 mL | INTRAMUSCULAR | Status: DC | PRN

## 2013-12-11 MED ORDER — CETYLPYRIDINIUM CHLORIDE 0.05 % MT LIQD: 7.0000 mL | Freq: Two times a day (BID) | OROMUCOSAL | Status: DC

## 2013-12-11 MED ORDER — FUROSEMIDE 10 MG/ML IJ SOLN
40.0000 mg | Freq: Three times a day (TID) | INTRAMUSCULAR | Status: DC
  Administered 2013-12-11: 40 mg via INTRAVENOUS

## 2013-12-11 MED ORDER — IPRATROPIUM-ALBUTEROL 0.5-2.5 (3) MG/3ML IN SOLN
3.0000 mL | RESPIRATORY_TRACT | Status: DC
  Administered 2013-12-11 (×4): 3 mL via RESPIRATORY_TRACT
  Filled 2013-12-11 (×3): qty 3

## 2013-12-11 MED ORDER — IPRATROPIUM-ALBUTEROL 0.5-2.5 (3) MG/3ML IN SOLN
3.0000 mL | RESPIRATORY_TRACT | Status: DC
  Administered 2013-12-11 – 2013-12-15 (×20): 3 mL via RESPIRATORY_TRACT
  Filled 2013-12-11 (×20): qty 3

## 2013-12-11 MED ORDER — ALBUTEROL (5 MG/ML) CONTINUOUS INHALATION SOLN
10.0000 mg/h | INHALATION_SOLUTION | Freq: Once | RESPIRATORY_TRACT | Status: AC
  Administered 2013-12-11: 10 mg/h via RESPIRATORY_TRACT
  Filled 2013-12-11: qty 20

## 2013-12-11 MED ORDER — CHLORHEXIDINE GLUCONATE 0.12 % MT SOLN
15.0000 mL | Freq: Two times a day (BID) | OROMUCOSAL | Status: DC
  Administered 2013-12-11 – 2013-12-13 (×4): 15 mL via OROMUCOSAL
  Filled 2013-12-11 (×4): qty 15

## 2013-12-11 MED ORDER — ACETAMINOPHEN 650 MG RE SUPP: 650.0000 mg | Freq: Four times a day (QID) | RECTAL | Status: DC | PRN

## 2013-12-11 MED ORDER — SODIUM CHLORIDE 0.9 % IJ SOLN
3.0000 mL | Freq: Two times a day (BID) | INTRAMUSCULAR | Status: DC
  Administered 2013-12-11 – 2013-12-13 (×4): 3 mL via INTRAVENOUS

## 2013-12-11 MED ORDER — HEPARIN (PORCINE) IN NACL 100-0.45 UNIT/ML-% IJ SOLN
1400.0000 [IU]/h | INTRAMUSCULAR | Status: DC
  Administered 2013-12-11: 1400 [IU]/h via INTRAVENOUS
  Filled 2013-12-11: qty 250

## 2013-12-11 MED ORDER — FUROSEMIDE 10 MG/ML IJ SOLN
40.0000 mg | Freq: Three times a day (TID) | INTRAMUSCULAR | Status: DC
  Filled 2013-12-11 (×3): qty 4

## 2013-12-11 MED ORDER — DOXYCYCLINE HYCLATE 100 MG PO TABS
100.0000 mg | ORAL_TABLET | Freq: Two times a day (BID) | ORAL | Status: DC
  Administered 2013-12-11: 100 mg via ORAL
  Filled 2013-12-11 (×2): qty 1

## 2013-12-11 MED ORDER — ALBUTEROL SULFATE (2.5 MG/3ML) 0.083% IN NEBU: 2.5000 mg | INHALATION_SOLUTION | RESPIRATORY_TRACT | Status: DC

## 2013-12-11 MED ORDER — IPRATROPIUM-ALBUTEROL 0.5-2.5 (3) MG/3ML IN SOLN
RESPIRATORY_TRACT | Status: AC
  Administered 2013-12-11: 3 mL via RESPIRATORY_TRACT
  Filled 2013-12-11: qty 3

## 2013-12-11 NOTE — Progress Notes (Signed)
ANTICOAGULATION CONSULT NOTE - Initial Consult  Pharmacy Consult for heparin  Indication: Rule out pulmonary embolus  Allergies  Allergen Reactions  . Shellfish Allergy Anaphylaxis    Patient Measurements: Height: 5\' 11"  (180.3 cm) Weight: 196 lb 13.9 oz (89.3 kg) IBW/kg (Calculated) : 75.3 Heparin Dosing Weight: 89kg  Vital Signs: Temp: 97.8 F (36.6 C) (09/03 1157) Temp src: Oral (09/03 1157) BP: 106/60 mmHg (09/03 0740) Pulse Rate: 109 (09/03 0740)  Labs:  Recent Labs  12/11/13 0130 12/11/13 0555 12/11/13 0800 12/11/13 1152  HGB 17.3*  --   --   --   HCT 54.5*  --   --   --   PLT 165  --   --   --   CREATININE 1.67*  --  1.54*  --   TROPONINI  --  <0.30  --  <0.30    Estimated Creatinine Clearance: 58.4 ml/min (by C-G formula based on Cr of 1.54).   Medical History: Past Medical History  Diagnosis Date  . Asthma   . Gout   . 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 Thrombocytopenia 07/28/2012  . Tobacco abuse 07/29/2012  . Influenza with respiratory manifestations 07/30/2012   Assessment: 53 year old male with progressive and consistent shortness of breath. Concern for pulmonary embolus will change from sq to IV heparin infusion. Last sq injection was this morning. D-dimer ordered. CBC this am was normal.  Goal of Therapy:  Heparin level 0.3-0.7 units/ml Monitor platelets by anticoagulation protocol: Yes   Plan:  Give 4000 units bolus x 1 Start heparin infusion at 1400 units/hr Check anti-Xa level in 6 hours and daily while on heparin Continue to monitor H&H and platelets  Erin Hearing PharmD., BCPS Clinical Pharmacist Pager [phone] 12/11/2013 2:05 PM

## 2013-12-11 NOTE — H&P (Signed)
Date: 12/11/2013               Patient Name:  [name] [name] MRN: [id number]  DOB: [birth year] Age / Sex: 53 y.o., male   PCP: Blain Pais, MD         Medical Service: Internal Medicine Teaching Service         Attending Physician: Dr. Madilyn Fireman, MD    First Contact: Dr. Karle Starch Moding Pager: [phone]  Second Contact: Dr. Karlyn Agee Pager: [phone]       After Hours (After 5p/  First Contact Pager: [phone]  weekends / holidays): Second Contact Pager: [phone]   Chief Complaint: dyspnea  History of Present Illness:   [name] [name] is a 53 year old gentleman with a history of gout, stage II chronic kidney disease, and COPD (though no PFTs), who presents with 3-4 days of worsening dyspnea and cough productive of clear sputum. He denies any sick contacts. Patient states that he ran out of his inhaler 3-4 days ago and that he has not been seen in our clinic since May 2014. Patient denies any fever, chest pain, abdominal pain, nausea, vomiting, constipation, diarrhea, dysuria, or hematuria. Patient does report having some swelling in his ankles, but denies one leg swelling more than the other. Patient says that he is a current smoker, smoking one pack every 2-3 days. His last drink of alcohol was about a week ago, and he denies any previous problems with alcohol withdrawal. Patient also endorses use of crack cocaine 2-3 weeks ago.  Patient was initially found to be satting in the 70s by EMS, who gave him albuterol, Atrovent, 125 mg Solu-Medrol, with successful improvement of his saturations to 98%.  Meds: Current Facility-Administered Medications  Medication Dose Route Frequency Provider Last Rate Last Dose  . doxycycline (VIBRA-TABS) tablet 100 mg  100 mg Oral Q12H Lucious Groves, DO      . furosemide (LASIX) injection 40 mg  40 mg Intravenous TID Lucious Groves, DO      . methylPREDNISolone sodium succinate (SOLU-MEDROL) 125 mg/2 mL injection 60 mg  60 mg Intravenous Q6H Lucious Groves, DO       Current Outpatient Prescriptions  Medication Sig Dispense Refill  . acetaminophen (TYLENOL) 500 MG tablet Take 500 mg by mouth every 6 (six) hours as needed for pain.      Marland Kitchen albuterol (PROVENTIL HFA;VENTOLIN HFA) 108 (90 BASE) MCG/ACT inhaler Inhale 2 puffs into the lungs every 4 (four) hours as needed. For shortness of breath      . budesonide-formoterol (SYMBICORT) 160-4.5 MCG/ACT inhaler Inhale 2 puffs into the lungs 2 (two) times daily.  1 Inhaler  0  . Fluticasone-Salmeterol (ADVAIR DISKUS) 100-50 MCG/DOSE AEPB Inhale 1 puff into the lungs 2 (two) times daily.      . [DISCONTINUED] ALBUTEROL IN Inhale into the lungs.        Allergies: Allergies as of 12/11/2013 - Review Complete 12/11/2013  Allergen Reaction Noted  . Shellfish allergy Anaphylaxis 07/28/2012   Past Medical History  Diagnosis Date  . Asthma   . Gout   . 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 Thrombocytopenia 07/28/2012  . Tobacco abuse 07/29/2012  . Influenza with respiratory manifestations 07/30/2012   Past Surgical History  Procedure Laterality Date  . Knee surgery      right knee s/p trauma   Family History  Problem Relation Age of Onset  . Colon cancer      mother ?  age of diagnosis    History   Social History  . Marital Status: Legally Separated    Spouse Name: N/A    Number of Children: N/A  . Years of Education: N/A   Occupational History  . Not on file.   Social History Main Topics  . Smoking status: Current Every Day Smoker -- 0.20 packs/day  . Smokeless tobacco: Not on file     Comment: 'Quitting"  . Alcohol Use: Yes     Comment: Beer - 12oz daily  . Drug Use: No  . Sexual Activity: Not on file   Other Topics Concern  . Not on file   Social History Narrative   07/28/12    Lives with daughter and grandkids x 2. 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 Denies liquor, wine. History of cocaine use. Denies IVDU   Has an orange card    Sexually active with 1 partner uses protection              Review of Systems: Pertinent items are noted in HPI.  Physical Exam: Blood pressure 133/117, pulse 111, temperature 97.9 F (36.6 C), temperature source Axillary, resp. rate 26, SpO2 95.00%. General: resting in bed with mild increased work of breathing HEENT: PERRL, EOMI, no scleral icterus, continuous nebulizer mask Cardiac: tachycardic, regular, no rubs, murmurs or gallops Pulm: bibasilar rales with minimal expiratory wheezes Abd: soft, nontender, nondistended, BS present Ext: warm and well perfused, bilateral 2+ pitting edema to the level of the mid calf Neuro: alert and oriented X3, cranial nerves II-XII grossly intact  Lab results: Basic Metabolic Panel:  Recent Labs  12/11/13 0130  NA 140  K 5.0  CL 100  CO2 29  GLUCOSE 110*  BUN 19  CREATININE 1.67*  CALCIUM 8.0*   Liver Function Tests: No results found for this basename: AST, ALT, ALKPHOS, BILITOT, PROT, ALBUMIN,  in the last 72 hours No results found for this basename: LIPASE, AMYLASE,  in the last 72 hours No results found for this basename: AMMONIA,  in the last 72 hours CBC:  Recent Labs  12/11/13 0130  WBC 6.5  NEUTROABS 3.0  HGB 17.3*  HCT 54.5*  MCV 80.0  PLT 165   Cardiac Enzymes: No results found for this basename: CKTOTAL, CKMB, CKMBINDEX, TROPONINI,  in the last 72 hours BNP:  Recent Labs  12/11/13 0130  PROBNP 2989.0*   D-Dimer: No results found for this basename: DDIMER,  in the last 72 hours CBG: No results found for this basename: GLUCAP,  in the last 72 hours Hemoglobin A1C: No results found for this basename: HGBA1C,  in the last 72 hours Fasting Lipid Panel: No results found for this basename: CHOL, HDL, LDLCALC, TRIG, CHOLHDL, LDLDIRECT,  in the last 72 hours Thyroid Function Tests: No results found for this  basename: TSH, T4TOTAL, FREET4, T3FREE, THYROIDAB,  in the last 72 hours Anemia Panel: No results found for this basename: VITAMINB12, FOLATE, FERRITIN, TIBC, IRON, RETICCTPCT,  in the last 72 hours Coagulation: No results found for this basename: LABPROT, INR,  in the last 72 hours Urine Drug Screen: Drugs of Abuse     Component Value Date/Time   LABOPIA NONE DETECTED 07/29/2012 0021   COCAINSCRNUR POSITIVE* 07/29/2012 0021   LABBENZ NONE DETECTED 07/29/2012 0021  AMPHETMU NONE DETECTED 07/29/2012 0021   THCU NONE DETECTED 07/29/2012 0021   LABBARB NONE DETECTED 07/29/2012 0021    Alcohol Level: No results found for this basename: ETH,  in the last 72 hours Urinalysis: No results found for this basename: COLORURINE, APPERANCEUR, LABSPEC, [place], GLUCOSEU, HGBUR, BILIRUBINUR, KETONESUR, PROTEINUR, UROBILINOGEN, NITRITE, LEUKOCYTESUR,  in the last 72 hours  Imaging results:  Dg Chest 2 View  12/11/2013   CLINICAL DATA:  Respiratory distress  EXAM: CHEST  2 VIEW  COMPARISON:  07/28/2012  FINDINGS: Enlarged cardiac silhouette. Aortic tortuosity. Central vascular congestion. Interstitial coarsening/prominence. Mild lung base opacities. No definite pleural effusion. No pneumothorax. Mild multilevel degenerative change.  IMPRESSION: Enlarged cardiac silhouette with central vascular congestion.  Interstitial prominence in part reflects chronic change. Superimposed interstitial edema not excluded.  Lung base opacities, favor atelectasis and/or scarring.   Electronically Signed   By: Carlos Levering M.D.   On: 12/11/2013 02:02    Other results: EKG: Sinus tachycardia, left axis deviation, T wave inversion in V2 and V3.  Assessment & Plan by Problem: Principal Problem:   Acute respiratory failure with hypoxia and hypercapnia Active Problems:   Tobacco abuse   CKD (chronic kidney disease), stage II   COPD exacerbation   Acute congestive heart failure   Cocaine abuse   Hypertension   Mr.  Mcmanamon is a 53 year old gentleman with a history of gout, stage II chronic kidney disease, and COPD who presents with 3-4 days of worsening dyspnea remarkable for acute respiratory failure, which is consistent with COPD exacerbation and acute congestive heart failure.  COPD exacerbation: Patient is a current smoker with increased dyspnea, frequency of cough with increased production of clear sputum. No PFTs on file. Symptoms have been getting worse since he ran out of his inhaler. May also have a component of acute congestive heart failure as well (see below). No evidence of consolidation on chest x-ray. Afebrile with no leukocytosis. Patient with improved breathing status post nebulizer treatments.  - duoneb q4h and albutrol q2h  - solumedrol 60 mg IV q6H  - Doxycycline 100 mg BID for 5 days (start date 12/11/13)  - obtain PFTs upon discharge and resolution of acute illness  Acute congestive heart failure: Patient presenting with dyspnea, lower extremity edema, bibasilar Rales. No echocardiogram on file. EKG remarkable for T wave inversions (not seen in 2014) in leads V2 and V3, though troponin negative x1 and no chest pain. BNP elevated to 2989 with no priors on file. Chest x-ray remarkable for cardiomegaly.  - Continue with diuresis: Lasix 40 mg IV 3 times a day  - Monitor ins and outs  - Echo in the morning.  Acute on chronic CKD: Creatinine up to 1.67 from 1.18 (in 2014). The chronology of this is rise is unclear.  - Will continue to monitor  Hypertension: Patient noted to be previously hypertensive at clinic visit in 2014 and has been hypertensive throughout admission. Patient likely to have chronic hypertension. The patient not on any pharmacotherapy.  - Followup as outpatient to confirm elevated blood pressures in the nonacute setting.  Gout: No active issues. Not on any pharmacotherapy.  Screening  - HIV   Dispo: Disposition is deferred at this time, awaiting improvement of current  medical problems. Anticipated discharge in approximately 3 day(s).   The patient does have a current PCP (Blain Pais, MD) and does need an [facility] hospital follow-up appointment after discharge.  The patient does not have transportation limitations that hinder transportation  to clinic appointments.  Signed: Luan Moore, MD 12/11/2013, 4:15 AM

## 2013-12-11 NOTE — Progress Notes (Signed)
ANTICOAGULATION CONSULT NOTE - Follow Up Consult   HL = 0.39 (goal 0.3 - 0.7 units/mL) Heparin dosing weight = 89 kg   Assessment: 64 YOM continues on IV heparin for rule out PE.  Heparin level therapeutic; no bleeding reported.   Plan: - Increase heparin gtt slightly to 1450 units/hr - F/U AM labs    Karima Carrell D. Mina Marble, PharmD, BCPS Pager:  [phone] 12/11/2013, 10:02 PM

## 2013-12-11 NOTE — ED Notes (Signed)
Patient began having SOB yesterday but did not come to ED. Happened again tonight and patient called EMS. PAtient has hx of COPD and has an inhaler, but has been out for 3 weeks. Initial sats were in the 70's and patient was diaphoretic and talking in short sentences. BP176/132. EMS gave 10 of albuterol, .5 atrovent, and 125 Solu-medrol. Patient now 98% on aerosol mask.

## 2013-12-11 NOTE — Progress Notes (Signed)
Subjective:    Currently, the patient reports that his breathing has improved, but he is still having difficulty.  He denies any wheezing, chest pain, headache currently.  He says that he does not use oxygen at home.  He says his cough was productive of clear sputum.  Interval Events: -Given IV Lasix 40 mg x2 with urine output 1100 yesterday and 750 today. -Satting 96% on non-rebreather mask 15L this morning, tried to wean to [employer], but satting into the 80s on 6L. -Afebrile, continued tachycardia. -Troponin negative x2. -EKG this morning showed progressive TWI now in lateral leads. -Repeat EKG at 1300 showed continued TWI with s1, q3, t3 pattern. -TTE: EF 65-70% with dilated right ventricle and moderately to severely increased PA pressure (72 mm Hg) -ABG on 100% O2: 7.193/102/159.   Objective:    Vital Signs:   Temp:  [97.8 F (36.6 C)-98.5 F (36.9 C)] 97.8 F (36.6 C) (09/03 1157) Pulse Rate:  [105-117] 109 (09/03 0740) Resp:  [19-28] 20 (09/03 0740) BP: (106-175)/(60-118) 106/60 mmHg (09/03 0740) SpO2:  [85 %-99 %] 90 % (09/03 1309) Weight:  [196 lb 13.9 oz (89.3 kg)] 196 lb 13.9 oz (89.3 kg) (09/03 0430)    24-hour weight change: Weight change:   Intake/Output:   Intake/Output Summary (Last 24 hours) at 12/11/13 1340 Last data filed at 12/11/13 1300  Gross per 24 hour  Intake     50 ml  Output   2250 ml  Net  -2200 ml      Physical Exam: General: Vital signs reviewed and noted. Well-developed, well-nourished, in no acute distress; alert, appropriate and cooperative throughout examination.  Lungs:  Increased respiratory effort, good air flow, mild rales bilterally, no wheezing or rhonchi.  Heart: Tachycardic. S1 and S2 normal without gallop, murmur, or rubs.  Abdomen:  BS normoactive. Soft, Nondistended, non-tender.  No masses or organomegaly.  Extremities: 1+ pitting edema to mid-calves bilaterally.     Labs:  Basic Metabolic Panel:  Recent Labs Lab  12/11/13 0130 12/11/13 0800  NA 140 142  K 5.0 4.1  CL 100 99  CO2 29 29  GLUCOSE 110* 177*  BUN 19 17  CREATININE 1.67* 1.54*  CALCIUM 8.0* 8.0*    Liver Function Tests:  Recent Labs Lab 12/11/13 0800  AST 24  ALT 28  ALKPHOS 88  BILITOT 0.4  PROT 6.5  ALBUMIN 2.8*   CBC:  Recent Labs Lab 12/11/13 0130  WBC 6.5  NEUTROABS 3.0  HGB 17.3*  HCT 54.5*  MCV 80.0  PLT 165    Cardiac Enzymes:  Recent Labs Lab 12/11/13 0555 12/11/13 1152  TROPONINI <0.30 <0.30    CBG:  Recent Labs Lab 12/11/13 0751  GLUCAP 218*    Microbiology: Results for orders placed during the hospital encounter of 12/11/13  MRSA PCR SCREENING     Status: None   Collection Time    12/11/13  6:38 AM      Result Value Ref Range Status   MRSA by PCR NEGATIVE  NEGATIVE Final   Comment:            The GeneXpert MRSA Assay (FDA     approved for NASAL specimens     only), is one component of a     comprehensive MRSA colonization     surveillance program. It is not     intended to diagnose MRSA     infection nor to guide or     monitor treatment for  MRSA infections.   UDS: negative HIV: unreactive  Other results: EKG: tachycardia, diffuse T wave inversion, S1Q3T3.  Imaging: Dg Chest 2 View  12/11/2013   CLINICAL DATA:  Respiratory distress  EXAM: CHEST  2 VIEW  COMPARISON:  07/28/2012  FINDINGS: Enlarged cardiac silhouette. Aortic tortuosity. Central vascular congestion. Interstitial coarsening/prominence. Mild lung base opacities. No definite pleural effusion. No pneumothorax. Mild multilevel degenerative change.  IMPRESSION: Enlarged cardiac silhouette with central vascular congestion.  Interstitial prominence in part reflects chronic change. Superimposed interstitial edema not excluded.  Lung base opacities, favor atelectasis and/or scarring.   Electronically Signed   By: Carlos Levering M.D.   On: 12/11/2013 02:02     Medications:    Infusions:    Scheduled  Medications: . antiseptic oral rinse  7 mL Mouth Rinse BID  . doxycycline  100 mg Oral Q12H  . furosemide  40 mg Intravenous TID  . heparin  5,000 Units Subcutaneous 3 times per day  . ipratropium-albuterol  3 mL Nebulization Q4H  . methylPREDNISolone (SOLU-MEDROL) injection  60 mg Intravenous Q6H  . sodium chloride  3 mL Intravenous Q12H  . sodium chloride  3 mL Intravenous Q12H    PRN Medications: sodium chloride, acetaminophen, acetaminophen, albuterol, sodium chloride   Assessment/ Plan:    Principal Problem:   Acute respiratory failure with hypoxia and hypercapnia Active Problems:   Tobacco abuse   CKD (chronic kidney disease), stage II   COPD exacerbation   Acute congestive heart failure   Cocaine abuse   Hypertension   Unspecified protein-calorie malnutrition  #Acute respiratory failure with hypoxia and hypercapnia, likely due to PE Right heart strain on echo, significant hypercapnia on 100% oxygen with Aa gradient of 426.5-suggesting shunt, S1Q3T3 on EKG, bilateral leg swelling, most consistent with pulmonary embolism. Can't get CTA due to kidney function.  Due to high suspicion, will start heparin drip to treat for PE. UDS negative, HIV unreactive.  Echo does not show any sign of heart failure.  Edema may be secondary to R heart strain. -Lower extremity dopplers -Ddimer -V/Q scan -Stop furosemide -Consulted PCCM due to high risk for decompensation -Continue NRB mask with sats 88-92. -Repeat ABG at 2000.  #COPD Chronic hypoxia with possible acute exacerbation.  Cough productive of clear sputum.  No PFTs but likely COPD given smoking history. -Continue ipratropium and albuterol nebulizers. -Continue solumedrol 60 mg IV q6h -Continue doxycycline 100 mg BID for 5 days (start date 12/11/13)  -Recommend PFTs as outpatient.  #Acute on chronic CKD Unclear Cr baseline (1.18 in 2014).  Creatine improved to 1.54 this morning.  Will continue to monitor due to furosemide that  was administered today. -BMP tomorrow morning.   #Hypertension Hypertensive on admission, but lower blood pressure on most recent measurement. -Continue to monitor and consider anti-hypertensives once clinically stable.   DVT PPX - heparin  CODE STATUS - Full code.  CONSULTS PLACED - None.  DISPO - Disposition is deferred at this time, awaiting improvement of hypoxia.   Anticipated discharge in approximately 2-3 day(s).   The patient does have a current PCP Hayes Ludwig, Sinclair Grooms, MD) and does need an [facility] hospital follow-up appointment after discharge.    Is the [facility] hospital follow-up appointment a one-time only appointment? no.  Does the patient have transportation limitations that hinder transportation to clinic appointments? yes   SERVICE NEEDED AT [place] COURSE         Y = Yes, Blank = No  PT:   OT:   RN:   Equipment:   Other:      Length of Stay: 0 day(s)   Signed: Arman Filter, MD  PGY-1, Internal Medicine Resident Pager: [phone] (7AM-5PM) 12/11/2013, 1:40 PM

## 2013-12-11 NOTE — Progress Notes (Signed)
Pt is currently on NRB for a break from [place]. Vitals WNL. No distress at this time. RT will continue to monitor, if Bipap needed at a later time.

## 2013-12-11 NOTE — Progress Notes (Signed)
Echocardiogram 2D Echocardiogram has been performed.  [name] [name] 12/11/2013, 8:39 AM

## 2013-12-11 NOTE — Consult Note (Signed)
Name: [name] [name] MRN: [id number] DOB: [birth year]    ADMISSION DATE:  12/11/2013 CONSULTATION DATE:  12/11/2013  REFERRING MD :  Graciella Freer PRIMARY SERVICE:  PCCM  CHIEF COMPLAINT:  SOB  BRIEF PATIENT DESCRIPTION:  53 year old male, current smoker with COPD, presented to ED 9/3 SOB and with cough about 4 days after running out of inhalers. He was admitted to IMTS. PE workup was started, ABG worsened. PCCM consulted   SIGNIFICANT EVENTS / STUDIES:  9/3 echo > LVEF 65-70%, grade 1 DD, LA/RV/RA all dilated, PA peak pressure 72. 9/3 doppler BLE > Neg   HISTORY OF PRESENT ILLNESS:  53 y.o.with CKD3 and COPD admitted with worseing dyspnea. The patient reports acute worsening 3-4 days ago when he ran out of his inhalers. However, he endorses feeling short of breath since the past few months and gradually worsening. He has had pedal edema for months, and orthopnea for the past few weeks. He also endorses PND. He denies chest pain, fever, chills, palpitations. He has smokers cough which he reports is no worse than before. He is a crack cocaine user, and an occassional alcohol user. Some evidence of R heart strain on ED EKG and PE workup was started. He was acidemic on ECG and PCCM has been asked to see.    PAST MEDICAL HISTORY :  Past Medical History  Diagnosis Date  . Asthma   . Gout   . 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 Thrombocytopenia 07/28/2012  . Tobacco abuse 07/29/2012  . Influenza with respiratory manifestations 07/30/2012   Past Surgical History  Procedure Laterality Date  . Knee surgery      right knee s/p trauma   Prior to Admission medications   Medication Sig Start Date End Date Taking? Authorizing Provider  albuterol (PROVENTIL HFA;VENTOLIN HFA) 108 (90 BASE) MCG/ACT inhaler Inhale 2 puffs into the lungs every 4 (four) hours as needed for wheezing or shortness of breath.   Yes Historical Provider, MD  Fluticasone-Salmeterol (ADVAIR) 100-50 MCG/DOSE AEPB Inhale 1  puff into the lungs 2 (two) times daily.   Yes Historical Provider, MD  ibuprofen (ADVIL,MOTRIN) 200 MG tablet Take 800 mg by mouth every 8 (eight) hours as needed for headache.   Yes Historical Provider, MD  Fluticasone-Salmeterol (ADVAIR DISKUS) 100-50 MCG/DOSE AEPB Inhale 1 puff into the lungs 2 (two) times daily. 08/08/12 08/08/13  Rosalia Hammers, MD   Allergies  Allergen Reactions  . Shellfish Allergy Anaphylaxis    FAMILY HISTORY:  Family History  Problem Relation Age of Onset  . Colon cancer      mother ? age of diagnosis    SOCIAL HISTORY:  reports that he has been smoking.  He does not have any smokeless tobacco history on file. He reports that he drinks alcohol. He reports that he does not use illicit drugs.  REVIEW OF SYSTEMS:     Bolds are positive  Constitutional: weight loss, gain, night sweats, Fevers, chills, fatigue .  HEENT: headaches, Sore throat, sneezing, nasal congestion, post nasal drip, Difficulty swallowing, Tooth/dental problems, visual complaints visual changes, ear ache CV:  chest pain, radiates: ,Orthopnea, PND, swelling in lower extremities, dizziness, palpitations, syncope.  GI  heartburn, indigestion, abdominal pain, nausea, vomiting, diarrhea, change in bowel habits, loss of appetite, bloody stools.  Resp: cough, non-productive: , hemoptysis, dyspnea, chest pain, pleuritic, worse with deep breathing and moving.  Skin: rash or itching or icterus GU: dysuria, change in color of urine, urgency or frequency.  flank pain, hematuria  MS: joint pain or swelling. decreased range of motion  Psych: change in mood or affect. depression or anxiety.  Neuro: difficulty with speech, weakness, numbness, ataxia    SUBJECTIVE:   VITAL SIGNS: Temp:  [97.7 F (36.5 C)-98.5 F (36.9 C)] 97.7 F (36.5 C) (09/03 1440) Pulse Rate:  [105-117] 109 (09/03 0740) Resp:  [19-28] 20 (09/03 0740) BP: (106-175)/(60-118) 106/60 mmHg (09/03 0740) SpO2:  [85 %-99 %] 90 % (09/03  1309) Weight:  [89.3 kg (196 lb 13.9 oz)] 89.3 kg (196 lb 13.9 oz) (09/03 0430)  PHYSICAL EXAMINATION: General:  Male, appears older than stated age. Mod resp failure on NRB Neuro:  Alert, oriented. Mild confusion HEENT:  [place]/AT, PERRL, No JVD noted Cardiovascular: Tachy, regular Lungs:  Poor air movement to bilateral bases.  Abdomen:  Soft, non-tender, non-distended Musculoskeletal: No acute deformity Skin:  Intact   Recent Labs Lab 12/11/13 0130 12/11/13 0800  NA 140 142  K 5.0 4.1  CL 100 99  CO2 29 29  BUN 19 17  CREATININE 1.67* 1.54*  GLUCOSE 110* 177*    Recent Labs Lab 12/11/13 0130  HGB 17.3*  HCT 54.5*  WBC 6.5  PLT 165   Dg Chest 2 View  12/11/2013   CLINICAL DATA:  Respiratory distress  EXAM: CHEST  2 VIEW  COMPARISON:  07/28/2012  FINDINGS: Enlarged cardiac silhouette. Aortic tortuosity. Central vascular congestion. Interstitial coarsening/prominence. Mild lung base opacities. No definite pleural effusion. No pneumothorax. Mild multilevel degenerative change.  IMPRESSION: Enlarged cardiac silhouette with central vascular congestion.  Interstitial prominence in part reflects chronic change. Superimposed interstitial edema not excluded.  Lung base opacities, favor atelectasis and/or scarring.   Electronically Signed   By: Carlos Levering M.D.   On: 12/11/2013 02:02    ASSESSMENT / PLAN:  PULMONARY A: Acute on chronic respiratory failure AECOPD Doubt PE, r heart strain appears chronic (pa 74), BLE dopplers negative Doubt pulm edema plays significant role.   P:   Continuous BiPAP as scheduled now 4 hr on 1 hr off Repeat ABG on NIMV at 6 pm Follow CXR Target SpO2 88-92%, reduce O2 IS and FV when able Scheduled BDs IV steroids escallation D/c V/Q scan, unstable, not gonna change management now, empiric heparin ok for now Add atrovent, increase freq albuteral  CARDIOVASCULAR A:  PULM htn from lung dz Diastolic heart dz P:  Monitor tele Lasix maybe  needed further in future, hold See pulm, doubt PE  RENAL A:   CKD - baseline creat 1.4 Appears dry  P:   Gentle hydration Follow Bmet Dc lasix  GASTROINTESTINAL A:   resp failure  P:   SUP: IV PPI NPO  HEMATOLOGIC A:   Concern for PE (doubt)  P:  Continue heparin gtt for now Follow CBC  INFECTIOUS A:  Doubt CAP    P:   D/c Doxy  Monitor clinically  ENDOCRINE A:  No acute issues   P:   Monitor  NEUROLOGIC A:   Acute encephalopathy 2nd to hypercarbia.  P:   RASS goal: 0 Avoid benzo  Georgann Housekeeper, ACNP [place] [facility] Pager [phone] or [phone]  Updated pt and sister in full, extensive  I have personally obtained a history, examined the patient, evaluated laboratory and imaging results, formulated the assessment and plan and placed orders. CRITICAL CARE: The patient is critically ill with multiple organ systems failure and requires high complexity decision making for assessment and support, frequent evaluation and titration  of therapies, application of advanced monitoring technologies and extensive interpretation of multiple databases. Critical Care Time devoted to patient care services described in this note is 40 minutes.     Lavon Paganini. Titus Mould, MD, [place] Pgr: [facility]

## 2013-12-11 NOTE — ED Provider Notes (Signed)
CSN: [id number]     Arrival date & time 12/11/13  0120 History   First MD Initiated Contact with Patient 12/11/13 0124     Chief Complaint  Patient presents with  . Respiratory Distress     (Consider location/radiation/quality/duration/timing/severity/associated sxs/prior Treatment) HPI [name] [name] is a 53 y.o. male with with a past medical history of COPD coming in with shortness of breath. Per EMS the patient was short of breath last night as well but it self resolves. Today his shortness of breath returns. EMS found him to be 70% on room air. He was given a breathing treatment IV steroids and oxygen. At that time the patient was speaking in one-word sentences. ED arrival the patient was speaking in full sentences. He was tolerating a short of breath but denied any chest pain at the time. Patient states his lower extremity swelling has gotten worse. He denies a history of heart failure. He denies compliance with his medications. There's been no recent fevers coughing changes in his bowel or bladder.  10 Systems reviewed and are negative for acute change except as noted in the HPI.     Past Medical History  Diagnosis Date  . Asthma   . Gout   . 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 Thrombocytopenia 07/28/2012  . Tobacco abuse 07/29/2012  . Influenza with respiratory manifestations 07/30/2012   Past Surgical History  Procedure Laterality Date  . Knee surgery      right knee s/p trauma   Family History  Problem Relation Age of Onset  . Colon cancer      mother ? age of diagnosis    History  Substance Use Topics  . Smoking status: Current Every Day Smoker -- 0.20 packs/day  . Smokeless tobacco: Not on file     Comment: 'Quitting"  . Alcohol Use: Yes     Comment: Beer - 12oz daily    Review of Systems    Allergies  Shellfish allergy  Home Medications   Prior to Admission medications   Medication Sig Start Date End Date Taking? Authorizing Provider   acetaminophen (TYLENOL) 500 MG tablet Take 500 mg by mouth every 6 (six) hours as needed for pain.    Historical Provider, MD  albuterol (PROVENTIL HFA;VENTOLIN HFA) 108 (90 BASE) MCG/ACT inhaler Inhale 2 puffs into the lungs every 4 (four) hours as needed. For shortness of breath 07/31/12   Dorothy Spark, MD  budesonide-formoterol [facility]) 160-4.5 MCG/ACT inhaler Inhale 2 puffs into the lungs 2 (two) times daily. 07/31/12   Dorothy Spark, MD  Fluticasone-Salmeterol (ADVAIR DISKUS) 100-50 MCG/DOSE AEPB Inhale 1 puff into the lungs 2 (two) times daily. 08/08/12 08/08/13  Rosalia Hammers, MD   BP 151/91  Pulse 111  Temp(Src) 97.8 F (36.6 C) (Oral)  Resp 26  Ht 5\' 11"  (1.803 m)  Wt 196 lb 13.9 oz (89.3 kg)  BMI 27.47 kg/m2  SpO2 96% Physical Exam  Nursing note and vitals reviewed. Constitutional: He is oriented to person, place, and time. Vital signs are normal. He appears well-developed and well-nourished.  Non-toxic appearance. He does not appear ill. He appears distressed.  HENT:  Head: Normocephalic and atraumatic.  Nose: Nose normal.  Mouth/Throat: Oropharynx is clear and moist. No oropharyngeal exudate.  Eyes: Conjunctivae and EOM are normal. Pupils are equal, round, and reactive to light. No scleral icterus.  Neck: Normal range of motion. Neck supple. No tracheal deviation, no edema, no erythema and normal range of motion  present. No mass and no thyromegaly present.  Cardiovascular: Normal rate, regular rhythm, S1 normal, S2 normal, normal heart sounds, intact distal pulses and normal pulses.  Exam reveals no gallop and no friction rub.   No murmur heard. Pulses:      Radial pulses are 2+ on the right side, and 2+ on the left side.       Dorsalis pedis pulses are 2+ on the right side, and 2+ on the left side.  Pulmonary/Chest: Effort normal and breath sounds normal. No respiratory distress. He has no wheezes. He has no rhonchi. He has no rales.  Clear lungs sounds were distant.   Abdominal: Soft. Normal appearance and bowel sounds are normal. He exhibits no distension, no ascites and no mass. There is no hepatosplenomegaly. There is no tenderness. There is no rebound, no guarding and no CVA tenderness.  Musculoskeletal: Normal range of motion. He exhibits no edema and no tenderness.  Lymphadenopathy:    He has no cervical adenopathy.  Neurological: He is alert and oriented to person, place, and time. He has normal strength. No cranial nerve deficit or sensory deficit. GCS eye subscore is 4. GCS verbal subscore is 5. GCS motor subscore is 6.  Skin: Skin is warm, dry and intact. No petechiae and no rash noted. He is not diaphoretic. No erythema. No pallor.  Psychiatric: He has a normal mood and affect. His behavior is normal. Judgment normal.    ED Course  Procedures (including critical care time) Labs Review Labs Reviewed  CBC WITH DIFFERENTIAL - Abnormal; Notable for the following:    RBC 6.81 (*)    Hemoglobin 17.3 (*)    HCT 54.5 (*)    MCH 25.4 (*)    Monocytes Relative 24 (*)    Monocytes Absolute 1.5 (*)    All other components within normal limits  BASIC METABOLIC PANEL - Abnormal; Notable for the following:    Glucose, Bld 110 (*)    Creatinine, Ser 1.67 (*)    Calcium 8.0 (*)    GFR calc non Af Amer 45 (*)    GFR calc Af Amer 52 (*)    All other components within normal limits  PRO B NATRIURETIC PEPTIDE - Abnormal; Notable for the following:    Pro B Natriuretic peptide (BNP) 2989.0 (*)    All other components within normal limits  I-STAT VENOUS BLOOD GAS, ED - Abnormal; Notable for the following:    pH, Ven 7.219 (*)    pCO2, Ven 88.1 (*)    pO2, Ven 115.0 (*)    Bicarbonate 35.9 (*)    Acid-Base Excess 3.0 (*)    All other components within normal limits  MRSA PCR SCREENING  TROPONIN I  COMPREHENSIVE METABOLIC PANEL  HIV ANTIBODY (ROUTINE TESTING)  URINE RAPID DRUG SCREEN (HOSP PERFORMED)  TROPONIN I  TROPONIN I  Randolm Idol, ED     Imaging Review Dg Chest 2 View  12/11/2013   CLINICAL DATA:  Respiratory distress  EXAM: CHEST  2 VIEW  COMPARISON:  07/28/2012  FINDINGS: Enlarged cardiac silhouette. Aortic tortuosity. Central vascular congestion. Interstitial coarsening/prominence. Mild lung base opacities. No definite pleural effusion. No pneumothorax. Mild multilevel degenerative change.  IMPRESSION: Enlarged cardiac silhouette with central vascular congestion.  Interstitial prominence in part reflects chronic change. Superimposed interstitial edema not excluded.  Lung base opacities, favor atelectasis and/or scarring.   Electronically Signed   By: Carlos Levering M.D.   On: 12/11/2013 02:02     EKG  Interpretation   Date/Time:  Thursday December 11 2013 01:22:31 EDT Ventricular Rate:  106 PR Interval:  149 QRS Duration: 94 QT Interval:  352 QTC Calculation: 467 R Axis:   -78 Text Interpretation:  Sinus tachycardia Biatrial enlargement Left axis  deviation TWI V2 V3 Confirmed by Glynn Octave [phone]) on 12/11/2013  1:29:48 AM      MDM   Final diagnoses:  COPD exacerbation  Acute exacerbation of CHF (congestive heart failure)    Patient presents emergency department for shortness of breath. Initial O2 sat was 70% on room air. He was started on the continuous neb breathing treatment, given steroids, and magnesium. Chest x-ray did not reveal any infection.BNP was 4000. Patient's presenting as a mixed picture of COPD versus CHF. It is my believe this is more so COPD.  Patient states his breathing is significantly improved. He remains tachypneic his lungs are clear his oxygen saturation stayed above 98%.  2 retained to the internal medicine teaching service for continued care.    Everlene Balls, MD 12/11/13 [phone]

## 2013-12-11 NOTE — Progress Notes (Signed)
Critical ABG value reported to RN.

## 2013-12-11 NOTE — ED Notes (Signed)
Attempted to place patient on 4L. Sats went down to 84%. Patient placed on NRB. Sats now 96%

## 2013-12-11 NOTE — Progress Notes (Signed)
Utilization review completed.  

## 2013-12-11 NOTE — Progress Notes (Signed)
VASCULAR LAB PRELIMINARY  PRELIMINARY  PRELIMINARY  PRELIMINARY  Bilateral lower extremity venous duplex  completed.    Preliminary report:  Bilateral:  No evidence of DVT, superficial thrombosis, or Baker's Cyst.    Roseline Ebarb, RVT 12/11/2013, 3:25 PM

## 2013-12-11 NOTE — Progress Notes (Signed)
Report given to [facility], RN on [address]. Pt alert and oriented, VSS except frequent O2 desats into the mid-upper 80s. Personal belongings (clothes) with pt at bedside. Notified Elink and central telemetry of transfer. Respiratory says pt will not be set up on bipap until on 13M. Pt's wife aware.  Pt transferred to 13M12 via bed while on monitor.

## 2013-12-11 NOTE — H&P (Addendum)
INTERNAL MEDICINE TEACHING ATTENDING NOTE  Day 0 of stay  Patient name: [name] [name]  MRN: [id number] Date of birth: [birth year]   Key clinical points and exam                                                          53 y.o.with CKD3 and COPD admitted with worseing dyspnea. The patient reports acute worsening 3-4 days ago when he ran out of his inhalers. However, he endorses feeling short of breath since the past few months and gradually worsening. He has had pedal edema for months, and orthopnea for the past few weeks. He also endorses PND. He denies chest pain, fever, chills, palpitations. He has smokers cough which he reports is no worse than before. He is a crack cocaine user, and an occassional alcohol user. He has been on a non-rebreather mask since the time of admission. He dropped to 85% on room air.   Vitals reviewed.  General: Resting in bed. Mild distress, wearing non-rebreather, not using accessory muscles for breathing, able to speak in full sentences.  HEENT: PERRL, EOMI, no scleral icterus. Heart: RRR, no rubs, murmurs or gallops. Lungs: Clear to auscultation bilaterally, no wheezes, rales, or rhonchi. Moving low air volumes bilaterally R>L.  Abdomen: Soft, nontender, nondistended, BS present. Extremities: Warm, no pedal edema. Neuro: Alert and oriented X3, no gross dysfunction noted.   I have reviewed the chart, lab results, EKG, imaging and relevant notes of this patient.    Assessment and Plan                                                                       COPD exacerbation - Agree with solumedrol and antibiotic coverage. Seems to be wheezing less, however oxygen requirement remains the same. Will continue same treatment.    New onset CHF - Agree with lasix coverage. IOs as below. Daily weights. We will do expedited 2D echo and if low EF will consult cardiology for the need of a possible cath. No beta blockers due to cocaine use and new onset HF.   Intake/Output  Summary (Last 24 hours) at 12/11/13 1242 Last data filed at 12/11/13 0923  Gross per 24 hour  Intake     50 ml  Output   1900 ml  Net  -1850 ml     TWI - Negative troponins - 2 sets so far. We will trend this EKG finding and another troponin is due.    Acute on chronic kidney disease - Likely related to hypoperfusion due to CHF.  Creatinine improving with diuresis.   Recent Labs Lab 12/11/13 0130 12/11/13 0800  BUN 19 17  CREATININE 1.67* 1.54*    Substance use - Education officer, museum consult. Hold beta blockers due to cocaine use and new onset HF.  I have seen and evaluated this patient and discussed it with my IM resident team - Dr Trudee Kuster and Aundra Dubin.  Please see the rest of the plan per resident note from today.   [place], [place] 12/11/2013, 12:27 PM.   ADDENDUM -  Echo showed right heart strain, elevated right heart pressure, dilated LV, and Dr Trudee Kuster brought to my notice that the most recent EKG just done has clear new S1Q3T3 pattern with TWIs in precordial leads. Troponins remain negative. We will start the patient on heparin IV immediately. CTA cannot be done right now due to renal dysfunction, however we will do VQ scan and Ddimer. Le dopplers.     Diangelo Radel 12/11/2013 2:08 PM

## 2013-12-12 LAB — POCT I-STAT 3, ART BLOOD GAS (G3+)
Acid-Base Excess: 13 mmol/L — ABNORMAL HIGH (ref 0.0–2.0)
BICARBONATE: 45.9 meq/L — AB (ref 20.0–24.0)
O2 Saturation: 93 %
PCO2 ART: 89.4 mmHg — AB (ref 35.0–45.0)
PH ART: 7.317 — AB (ref 7.350–7.450)
PO2 ART: 76 mmHg — AB (ref 80.0–100.0)
Patient temperature: 97.9
TCO2: 49 mmol/L (ref 0–100)

## 2013-12-12 LAB — BASIC METABOLIC PANEL
Anion gap: 6 (ref 5–15)
BUN: 15 mg/dL (ref 6–23)
CO2: 39 mEq/L — ABNORMAL HIGH (ref 19–32)
CREATININE: 1.34 mg/dL (ref 0.50–1.35)
Calcium: 8.4 mg/dL (ref 8.4–10.5)
Chloride: 99 mEq/L (ref 96–112)
GFR, EST AFRICAN AMERICAN: 68 mL/min — AB (ref >90)
GFR, EST NON AFRICAN AMERICAN: 59 mL/min — AB (ref >90)
Glucose, Bld: 113 mg/dL — ABNORMAL HIGH (ref 70–99)
POTASSIUM: 5.2 meq/L (ref 3.7–5.3)
Sodium: 144 mEq/L (ref 137–147)

## 2013-12-12 LAB — CBC
HEMATOCRIT: 56 % — AB (ref 39.0–52.0)
Hemoglobin: 16.5 g/dL (ref 13.0–17.0)
MCH: 25.2 pg — ABNORMAL LOW (ref 26.0–34.0)
MCHC: 29.5 g/dL — AB (ref 30.0–36.0)
MCV: 85.4 fL (ref 78.0–100.0)
Platelets: 166 10*3/uL (ref 150–400)
RBC: 6.56 MIL/uL — ABNORMAL HIGH (ref 4.22–5.81)
RDW: 14.6 % (ref 11.5–15.5)
WBC: 10.6 10*3/uL — ABNORMAL HIGH (ref 4.0–10.5)

## 2013-12-12 LAB — GLUCOSE, CAPILLARY
GLUCOSE-CAPILLARY: 123 mg/dL — AB (ref 70–99)
GLUCOSE-CAPILLARY: 130 mg/dL — AB (ref 70–99)
Glucose-Capillary: 114 mg/dL — ABNORMAL HIGH (ref 70–99)
Glucose-Capillary: 134 mg/dL — ABNORMAL HIGH (ref 70–99)

## 2013-12-12 LAB — HEPARIN LEVEL (UNFRACTIONATED): Heparin Unfractionated: 0.37 IU/mL (ref 0.30–0.70)

## 2013-12-12 MED ORDER — LEVOFLOXACIN IN D5W 500 MG/100ML IV SOLN
500.0000 mg | INTRAVENOUS | Status: DC
  Administered 2013-12-12 – 2013-12-13 (×2): 500 mg via INTRAVENOUS
  Filled 2013-12-12 (×4): qty 100

## 2013-12-12 MED ORDER — INSULIN ASPART 100 UNIT/ML ~~LOC~~ SOLN: 0.0000 [IU] | Freq: Every day | SUBCUTANEOUS | Status: DC

## 2013-12-12 MED ORDER — HEPARIN SODIUM (PORCINE) 5000 UNIT/ML IJ SOLN
5000.0000 [IU] | Freq: Three times a day (TID) | INTRAMUSCULAR | Status: DC
  Administered 2013-12-12 – 2013-12-15 (×8): 5000 [IU] via SUBCUTANEOUS
  Filled 2013-12-12 (×9): qty 1

## 2013-12-12 MED ORDER — INSULIN ASPART 100 UNIT/ML ~~LOC~~ SOLN
0.0000 [IU] | Freq: Three times a day (TID) | SUBCUTANEOUS | Status: DC
  Administered 2013-12-13: 3 [IU] via SUBCUTANEOUS

## 2013-12-12 NOTE — Progress Notes (Signed)
Pt taken off BiPAP for break. Pt placed on 100% NRB. Vitals are stable. RT will continue to monitor pt.

## 2013-12-12 NOTE — Consult Note (Signed)
PULMONARY / CRITICAL CARE MEDICINE Name: [name] [name] MRN: [id number] DOB: [birth year]    ADMISSION DATE:  12/11/2013 CONSULTATION DATE:  12/11/2013  REFERRING MD :  Graciella Freer PRIMARY SERVICE:  PCCM  CHIEF COMPLAINT:  SOB  BRIEF PATIENT DESCRIPTION:  53 yo active smoker with COPD and severe pulmonary hypertension resented 9/3 with dyspnea and cough for 4 days after running out of his inhalers. He was admitted to IMTS. PE workup was started, ABG worsened. PCCM consulted   SIGNIFICANT EVENTS / STUDIES:  9/3  TTE >>> EF 65-70%, grade 1 DD, LA/RV/RA all dilated, PAP 72 torr 9/3  Venous Doppler BLE >>> neg  INTERVAL HISTORY:  Off BiPAP, on [place], feels much better  VITAL SIGNS: Temp:  [97.7 F (36.5 C)-98.5 F (36.9 C)] 98.3 F (36.8 C) (09/04 1203) Pulse Rate:  [92-113] 101 (09/04 1300) Resp:  [14-30] 24 (09/04 1300) BP: (118-162)/(71-108) 159/99 mmHg (09/04 1300) SpO2:  [88 %-97 %] 89 % (09/04 1300) FiO2 (%):  [60 %-100 %] 60 % (09/04 0901) Weight:  [87.7 kg (193 lb 5.5 oz)] 87.7 kg (193 lb 5.5 oz) (09/04 0500)  PHYSICAL EXAMINATION: General:  No distress Neuro:  Awake, alert HEENT:  PERRL Cardiovascular: Tachycardic, regular Lungs:  Bilateral diminished air entry, expiratory wheezes, prolonged expiratory phase Abdomen:  Soft, non-tender, non-distended Musculoskeletal: Symmetric edema Skin:  Intact  LABS: CBC  Recent Labs Lab 12/11/13 0130 12/12/13 0519  WBC 6.5 10.6*  HGB 17.3* 16.5  HCT 54.5* 56.0*  PLT 165 166   Coag's No results found for this basename: APTT, INR,  in the last 168 hours BMET  Recent Labs Lab 12/11/13 0130 12/11/13 0800 12/12/13 0519  NA 140 142 144  K 5.0 4.1 5.2  CL 100 99 99  CO2 29 29 39*  BUN 19 17 15   CREATININE 1.67* 1.54* 1.34  GLUCOSE 110* 177* 113*   Electrolytes  Recent Labs Lab 12/11/13 0130 12/11/13 0800 12/12/13 0519  CALCIUM 8.0* 8.0* 8.4   Sepsis Markers No results found for this basename: LATICACIDVEN, PROCALCITON,  O2SATVEN,  in the last 168 hours ABG  Recent Labs Lab 12/11/13 1455 12/11/13 1854 12/12/13 0944  PHART 7.193* 7.286* 7.317*  PCO2ART 102.0* 85.2* 89.4*  PO2ART 159.0* 74.0* 76.0*   Liver Enzymes  Recent Labs Lab 12/11/13 0800  AST 24  ALT 28  ALKPHOS 88  BILITOT 0.4  ALBUMIN 2.8*   Cardiac Enzymes  Recent Labs Lab 12/11/13 0130 12/11/13 0555 12/11/13 1152 12/11/13 1535  TROPONINI  --  <0.30 <0.30 <0.30  PROBNP 2989.0*  --   --   --    Glucose  Recent Labs Lab 12/11/13 0751 12/11/13 1822 12/12/13 0829 12/12/13 1201  GLUCAP 218* 101* 114* 123*   IMAGING:  Dg Chest 2 View  12/11/2013   CLINICAL DATA:  Respiratory distress  EXAM: CHEST  2 VIEW  COMPARISON:  07/28/2012  FINDINGS: Enlarged cardiac silhouette. Aortic tortuosity. Central vascular congestion. Interstitial coarsening/prominence. Mild lung base opacities. No definite pleural effusion. No pneumothorax. Mild multilevel degenerative change.  IMPRESSION: Enlarged cardiac silhouette with central vascular congestion.  Interstitial prominence in part reflects chronic change. Superimposed interstitial edema not excluded.  Lung base opacities, favor atelectasis and/or scarring.   Electronically Signed   By: Carlos Levering M.D.   On: 12/11/2013 02:02   ASSESSMENT / PLAN:  PULMONARY A: Acute on chronic respiratory failure AECOPD Severe chronic pulmonary hypertension secondary to chronic lung disease Pulmonary embolism is unlikely given presentation and DVT ruled out  Tobacco use disorder  P:   Goal SpO2 88-92 Supplemental oxygen BiPAP PRN Trend CXR Albuterol / Atrovent Solu-Medrol IV 80 mg q8h D/c Heparin gtt Tobacco cessation  CARDIOVASCULAR A:  R heart strain from severe pulmonary hypertension Chronic diastolic congestive heart failure P:  No intervention required  RENAL A:   CKD P:   Trend BMP  GASTROINTESTINAL A:   Nutrition GI Px is not indicated P:   Regular  diet  HEMATOLOGIC A:   Steroids induced leucocytosis VTE Px P:  Trend CBC Heparin [place]  INFECTIOUS A:  Imperical abx for AECOP P:   Levaquin 9/4 >>>  ENDOCRINE A:  At risk for steroid-induced hyperglycemia P:   SSI  NEUROLOGIC A:   No active issues P:   Avoid sedatives / hypnotics  I have personally obtained history, examined patient, evaluated and interpreted laboratory and imaging results, reviewed medical records, formulated assessment / plan and placed orders.  Doree Fudge, MD Pulmonary and [place] Pager: [phone]  12/12/2013, 2:55 PM

## 2013-12-12 NOTE — Progress Notes (Signed)
ANTICOAGULATION CONSULT NOTE - Follow Up Consult  Pharmacy Consult for heparin  Indication: Rule out pulmonary embolus  Allergies  Allergen Reactions  . Shellfish Allergy Anaphylaxis   Patient Measurements: Height: 5\' 11"  (180.3 cm) Weight: 193 lb 5.5 oz (87.7 kg) IBW/kg (Calculated) : 75.3 Heparin Dosing Weight: 89kg  Vital Signs: Temp: 97.9 F (36.6 C) (09/04 0451) Temp src: Axillary (09/04 0451) BP: 118/78 mmHg (09/04 0700) Pulse Rate: 92 (09/04 0700)  Labs:  Recent Labs  12/11/13 0130 12/11/13 0555 12/11/13 0800 12/11/13 1152 12/11/13 1535 12/11/13 2123 12/12/13 0519  HGB 17.3*  --   --   --   --   --  16.5  HCT 54.5*  --   --   --   --   --  56.0*  PLT 165  --   --   --   --   --  166  HEPARINUNFRC  --   --   --   --   --  0.39 0.37  CREATININE 1.67*  --  1.54*  --   --   --  1.34  TROPONINI  --  <0.30  --  <0.30 <0.30  --   --    Estimated Creatinine Clearance: 67.1 ml/min (by C-G formula based on Cr of 1.34).  Medical History: Past Medical History  Diagnosis Date  . Asthma   . Gout   . 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 Thrombocytopenia 07/28/2012  . Tobacco abuse 07/29/2012  . Influenza with respiratory manifestations 07/30/2012   Assessment: 53 year old male with progressive and consistent shortness of breath. He has an extensive history for smoking, COPD, crack cocaine use.  He apparently ran out of his inhalers and came to the hospital for worsening SOB.  He was started on IV heparin for possible PE but dopplers have been negative and CT of chest doesn't reveal PE.  His heparin level this AM is WNL at 0.37 Iu/ml.  I will discuss with MD regarding the need to continue therapy.  Goal of Therapy:  Heparin level 0.3-0.7 units/ml Monitor platelets by anticoagulation protocol: Yes   Plan:  1.  F/u with MD regarding ongoing Heparin needs. 2.  No change to rate for now. 3.  Monitor closely for bleeding complications  Rober Minion,  PharmD., MS Clinical Pharmacist Pager:  [phone] Thank you for allowing pharmacy to be part of this patients care team.    12/12/2013 8:32 AM

## 2013-12-12 NOTE — Progress Notes (Signed)
Critical ABG value reported to RN.     Ref. Range 12/12/2013 09:44  Sample type No range found ARTERIAL  pH, Arterial Latest Range: 7.350-7.450  7.317 (L)  pCO2 arterial Latest Range: 35.0-45.0 mmHg 89.4 (HH)  pO2, Arterial Latest Range: 80.0-100.0 mmHg 76.0 (L)  Bicarbonate Latest Range: 20.0-24.0 mEq/L 45.9 (H)  TCO2 Latest Range: 0-100 mmol/L 49  Acid-Base Excess Latest Range: 0.0-2.0 mmol/L 13.0 (H)  O2 Saturation No range found 93.0  Patient temperature No range found 97.9 F  Collection site No range found RADIAL, ALLEN'S TEST ACCEPTABLE

## 2013-12-12 NOTE — Progress Notes (Signed)
Pt taken off NRB and placed on 5 LPM nasal cannula per MD. Pt is tolerating well at this time. SPO2 90-93%. RT will continue to monitor pt.

## 2013-12-12 NOTE — Progress Notes (Signed)
ANTIBIOTIC CONSULT NOTE - INITIAL  Pharmacy Consult for Levaquin Indication: Acute exacerbation of COPD   Allergies  Allergen Reactions  . Shellfish Allergy Anaphylaxis    Patient Measurements: Height: 5\' 11"  (180.3 cm) Weight: 193 lb 5.5 oz (87.7 kg) IBW/kg (Calculated) : 75.3 Adjusted Body Weight: n/a   Vital Signs: Temp: 98.3 F (36.8 C) (09/04 1203) Temp src: Oral (09/04 1203) BP: 159/99 mmHg (09/04 1300) Pulse Rate: 101 (09/04 1300) Intake/Output from previous day: 09/03 0701 - 09/04 0700 In: 351.8 [I.V.:351.8] Out: 2125 [Urine:2125] Intake/Output from this shift: Total I/O In: 147 [I.V.:147] Out: 700 [Urine:700]  Labs:  Recent Labs  12/11/13 0130 12/11/13 0800 12/12/13 0519  WBC 6.5  --  10.6*  HGB 17.3*  --  16.5  PLT 165  --  166  CREATININE 1.67* 1.54* 1.34   Estimated Creatinine Clearance: 67.1 ml/min (by C-G formula based on Cr of 1.34). No results found for this basename: VANCOTROUGH, Corlis Leak, VANCORANDOM, GENTTROUGH, GENTPEAK, GENTRANDOM, TOBRATROUGH, TOBRAPEAK, TOBRARND, AMIKACINPEAK, AMIKACINTROU, AMIKACIN,  in the last 72 hours   Microbiology: Recent Results (from the past 720 hour(s))  MRSA PCR SCREENING     Status: None   Collection Time    12/11/13  6:38 AM      Result Value Ref Range Status   MRSA by PCR NEGATIVE  NEGATIVE Final   Comment:            The GeneXpert MRSA Assay (FDA     approved for NASAL specimens     only), is one component of a     comprehensive MRSA colonization     surveillance program. It is not     intended to diagnose MRSA     infection nor to guide or     monitor treatment for     MRSA infections.    Medical History: Past Medical History  Diagnosis Date  . Asthma   . Gout   . 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 Thrombocytopenia 07/28/2012  . Tobacco abuse 07/29/2012  . Influenza with respiratory manifestations 07/30/2012    Medications:  Prescriptions prior to admission  Medication  Sig Dispense Refill  . albuterol (PROVENTIL HFA;VENTOLIN HFA) 108 (90 BASE) MCG/ACT inhaler Inhale 2 puffs into the lungs every 4 (four) hours as needed for wheezing or shortness of breath.      . Fluticasone-Salmeterol (ADVAIR) 100-50 MCG/DOSE AEPB Inhale 1 puff into the lungs 2 (two) times daily.      Marland Kitchen ibuprofen (ADVIL,MOTRIN) 200 MG tablet Take 800 mg by mouth every 8 (eight) hours as needed for headache.      . Fluticasone-Salmeterol (ADVAIR DISKUS) 100-50 MCG/DOSE AEPB Inhale 1 puff into the lungs 2 (two) times daily.       Assessment: 6 YOM with COPD and severe pulmonary hypertension presented with cough x 4 das after running out of his inhalers. Pharmacy consulted to start Levaquin as empiric therapy for AECOPD. WBC elevated at 10.6. Pt is afebrile. CrCl ~ 67 mL/min.   Goal of Therapy:  Resolution of infection   Plan:  1) Start Levaquin 500 mg IV Q 24 hours 2) Monitor CBC, renal fx, cultures and patient's clinical progress  Albertina Parr, PharmD.  Clinical Pharmacist Pager [phone]

## 2013-12-13 ENCOUNTER — Inpatient Hospital Stay (HOSPITAL_COMMUNITY): Payer: MEDICAID

## 2013-12-13 DIAGNOSIS — J189 Pneumonia, unspecified organism: Secondary | ICD-10-CM

## 2013-12-13 DIAGNOSIS — F17200 Nicotine dependence, unspecified, uncomplicated: Secondary | ICD-10-CM

## 2013-12-13 LAB — GLUCOSE, CAPILLARY
GLUCOSE-CAPILLARY: 123 mg/dL — AB (ref 70–99)
Glucose-Capillary: 183 mg/dL — ABNORMAL HIGH (ref 70–99)
Glucose-Capillary: 80 mg/dL (ref 70–99)

## 2013-12-13 MED ORDER — PREDNISONE 50 MG PO TABS
60.0000 mg | ORAL_TABLET | Freq: Every day | ORAL | Status: DC
  Administered 2013-12-14 – 2013-12-15 (×2): 60 mg via ORAL
  Filled 2013-12-13 (×3): qty 1

## 2013-12-13 MED ORDER — IOHEXOL 350 MG/ML SOLN
80.0000 mL | Freq: Once | INTRAVENOUS | Status: AC | PRN
  Administered 2013-12-13: 80 mL via INTRAVENOUS

## 2013-12-13 MED ORDER — CETYLPYRIDINIUM CHLORIDE 0.05 % MT LIQD
7.0000 mL | Freq: Two times a day (BID) | OROMUCOSAL | Status: DC
  Administered 2013-12-13 – 2013-12-15 (×5): 7 mL via OROMUCOSAL

## 2013-12-13 NOTE — Progress Notes (Signed)
Patient alert  And oriented, no c/o pain or dizziness, BP is elevated 160/106,  Oxygen sat 92%/3L. PA paged, awaiting PA call back. Will continue to monitor.

## 2013-12-13 NOTE — Progress Notes (Signed)
PULMONARY / CRITICAL CARE MEDICINE Name: [name] [name] MRN: [id number] DOB: 1960/05/23    ADMISSION DATE:  12/11/2013 CONSULTATION DATE:  12/11/2013  REFERRING MD :  Graciella Freer PRIMARY SERVICE:  PCCM  CHIEF COMPLAINT:  SOB  BRIEF PATIENT DESCRIPTION:  53 yo active smoker with COPD and severe pulmonary hypertension resented 9/3 with dyspnea and cough for 4 days after running out of his inhalers. He was admitted to IMTS. PE workup was started, ABG worsened. PCCM consulted   SIGNIFICANT EVENTS / STUDIES:  9/3  TTE >>> EF 65-70%, grade 1 DD, LA/RV/RA all dilated, PAP 72 torr 9/3  Venous Doppler BLE >>> neg 9/5  Chest CTA >>> NO embolus, emphysema and changes consistent with pulmonary hypertension  INTERVAL HISTORY:  Continues to have oxygen requirements  VITAL SIGNS: Temp:  [98.3 F (36.8 C)-98.8 F (37.1 C)] 98.8 F (37.1 C) (09/05 0753) Pulse Rate:  [91-112] 105 (09/05 0913) Resp:  [15-30] 19 (09/05 0913) BP: (126-177)/(84-116) 156/103 mmHg (09/05 0900) SpO2:  [87 %-100 %] 89 % (09/05 0913) Weight:  [87.3 kg (192 lb 7.4 oz)] 87.3 kg (192 lb 7.4 oz) (09/05 0500)  PHYSICAL EXAMINATION: General:  Comfortable Neuro:  Awake, alert HEENT:  PERRL Cardiovascular: Regular, no murmurs  Lungs:  Bilateral wheezing, prolonged expiratory phase Abdomen:  Soft, non-tender, non-distended Musculoskeletal: Symmetric edema Skin:  Intact  LABS: CBC  Recent Labs Lab 12/11/13 0130 12/12/13 0519  WBC 6.5 10.6*  HGB 17.3* 16.5  HCT 54.5* 56.0*  PLT 165 166   Coag's No results found for this basename: APTT, INR,  in the last 168 hours  BMET  Recent Labs Lab 12/11/13 0130 12/11/13 0800 12/12/13 0519  NA 140 142 144  K 5.0 4.1 5.2  CL 100 99 99  CO2 29 29 39*  BUN 19 17 15   CREATININE 1.67* 1.54* 1.34  GLUCOSE 110* 177* 113*   Electrolytes  Recent Labs Lab 12/11/13 0130 12/11/13 0800 12/12/13 0519  CALCIUM 8.0* 8.0* 8.4   Sepsis Markers No results found for this basename:  LATICACIDVEN, PROCALCITON, O2SATVEN,  in the last 168 hours  ABG  Recent Labs Lab 12/11/13 1455 12/11/13 1854 12/12/13 0944  PHART 7.193* 7.286* 7.317*  PCO2ART 102.0* 85.2* 89.4*  PO2ART 159.0* 74.0* 76.0*   Liver Enzymes  Recent Labs Lab 12/11/13 0800  AST 24  ALT 28  ALKPHOS 88  BILITOT 0.4  ALBUMIN 2.8*   Cardiac Enzymes  Recent Labs Lab 12/11/13 0130 12/11/13 0555 12/11/13 1152 12/11/13 1535  TROPONINI  --  <0.30 <0.30 <0.30  PROBNP 2989.0*  --   --   --    Glucose  Recent Labs Lab 12/11/13 1822 12/12/13 0829 12/12/13 1201 12/12/13 1723 12/12/13 2210 12/13/13 0736  GLUCAP 101* 114* 123* 134* 130* 123*   IMAGING:  No results found.  ASSESSMENT / PLAN:  PULMONARY A: Acute on chronic respiratory failure AECOPD Severe chronic pulmonary hypertension secondary to chronic lung disease Pulmonary embolism is unlikely given presentation and DVT ruled out but continues to need oxygen ( not on oxygen prior to admission ); also renal function has normalized and not prohibitive of CTA Tobacco use disorder  P:   Goal SpO2 88-92 Supplemental oxygen Trend CXR Albuterol / Atrovent Start Prednisone 60 Tobacco cessation Chest CTA  CARDIOVASCULAR A:  R heart strain from severe pulmonary hypertension Chronic diastolic congestive heart failure P:  No intervention required  RENAL A:   CKD P:   Trend BMP  GASTROINTESTINAL A:   Nutrition GI  Px is not indicated P:   Regular diet  HEMATOLOGIC A:   Steroids induced leucocytosis VTE Px P:  Trend CBC Heparin [place]  INFECTIOUS A:  Imperical abx for AECOP P:   Levaquin 9/4 >>>  ENDOCRINE A:  At risk for steroid-induced hyperglycemia P:   SSI  NEUROLOGIC A:   No active issues P:   Avoid sedatives / hypnotics  Transfer to medical floor.  IMTS to assume care 9/6.  PCCM will sign off.  I have personally obtained history, examined patient, evaluated and interpreted laboratory and  imaging results, reviewed medical records, formulated assessment / plan and placed orders.  Doree Fudge, MD Pulmonary and [place] Pager: [phone]  12/13/2013, 9:46 AM

## 2013-12-13 NOTE — Progress Notes (Signed)
Patient transferred to room 3E21 after giving report to bedside RN Ty.  I address Ty's concern regarding Pt BP with Tammy Parrett NP prior to transfer.  Transfer uneventful Patient stable.  Richardean Canal RN, BSN, CCRN

## 2013-12-13 NOTE — Plan of Care (Signed)
Problem: ICU Phase Progression Outcomes Goal: Initial discharge plan identified Outcome: Completed/Met Date Met:  12/13/13 Patient plans to discharge home where her reports his daughter lives with him.

## 2013-12-14 DIAGNOSIS — I272 Pulmonary hypertension, unspecified: Secondary | ICD-10-CM | POA: Diagnosis present

## 2013-12-14 DIAGNOSIS — N182 Chronic kidney disease, stage 2 (mild): Secondary | ICD-10-CM

## 2013-12-14 LAB — BASIC METABOLIC PANEL
Anion gap: 9 (ref 5–15)
BUN: 21 mg/dL (ref 6–23)
CO2: 39 mEq/L — ABNORMAL HIGH (ref 19–32)
CREATININE: 1.17 mg/dL (ref 0.50–1.35)
Calcium: 8.8 mg/dL (ref 8.4–10.5)
Chloride: 92 mEq/L — ABNORMAL LOW (ref 96–112)
GFR calc Af Amer: 80 mL/min — ABNORMAL LOW (ref >90)
GFR, EST NON AFRICAN AMERICAN: 69 mL/min — AB (ref >90)
Glucose, Bld: 76 mg/dL (ref 70–99)
Potassium: 4 mEq/L (ref 3.7–5.3)
Sodium: 140 mEq/L (ref 137–147)

## 2013-12-14 LAB — CBC
HEMATOCRIT: 56.3 % — AB (ref 39.0–52.0)
HEMOGLOBIN: 17.6 g/dL — AB (ref 13.0–17.0)
MCH: 25.4 pg — ABNORMAL LOW (ref 26.0–34.0)
MCHC: 31.3 g/dL (ref 30.0–36.0)
MCV: 81.2 fL (ref 78.0–100.0)
Platelets: 175 10*3/uL (ref 150–400)
RBC: 6.93 MIL/uL — ABNORMAL HIGH (ref 4.22–5.81)
RDW: 14.5 % (ref 11.5–15.5)
WBC: 16.6 10*3/uL — ABNORMAL HIGH (ref 4.0–10.5)

## 2013-12-14 LAB — GLUCOSE, CAPILLARY
GLUCOSE-CAPILLARY: 78 mg/dL (ref 70–99)
GLUCOSE-CAPILLARY: 121 mg/dL — AB (ref 70–99)
Glucose-Capillary: 102 mg/dL — ABNORMAL HIGH (ref 70–99)
Glucose-Capillary: 81 mg/dL (ref 70–99)

## 2013-12-14 MED ORDER — LEVOFLOXACIN 500 MG PO TABS
500.0000 mg | ORAL_TABLET | ORAL | Status: DC
  Administered 2013-12-14 – 2013-12-15 (×2): 500 mg via ORAL
  Filled 2013-12-14 (×2): qty 1

## 2013-12-14 MED ORDER — HYDROCHLOROTHIAZIDE 25 MG PO TABS
25.0000 mg | ORAL_TABLET | Freq: Every day | ORAL | Status: DC
  Administered 2013-12-14 – 2013-12-15 (×2): 25 mg via ORAL
  Filled 2013-12-14 (×2): qty 1

## 2013-12-14 NOTE — Progress Notes (Signed)
ANTIBIOTIC CONSULT NOTE - FOLLOW UP  Pharmacy Consult for Levaquin Indication: COPD exacerbation  Allergies  Allergen Reactions  . Shellfish Allergy Anaphylaxis    Patient Measurements: Height: 5\' 6"  (167.6 cm) Weight: 184 lb 4.9 oz (83.6 kg) (B Scale) IBW/kg (Calculated) : 63.8 Adjusted Body Weight:   Vital Signs: Temp: 98.5 F (36.9 C) (09/06 0954) Temp src: Oral (09/06 0954) BP: 154/94 mmHg (09/06 0954) Pulse Rate: 103 (09/06 0954) Intake/Output from previous day: 09/05 0701 - 09/06 0700 In: 783 [P.O.:780; I.V.:3] Out: 2750 [Urine:2750] Intake/Output from this shift: Total I/O In: 483 [P.O.:480; I.V.:3] Out: 950 [Urine:950]  Labs:  Recent Labs  12/12/13 0519 12/14/13 0336  WBC 10.6* 16.6*  HGB 16.5 17.6*  PLT 166 175  CREATININE 1.34 1.17   Estimated Creatinine Clearance: 73.2 ml/min (by C-G formula based on Cr of 1.17). No results found for this basename: VANCOTROUGH, Corlis Leak, VANCORANDOM, GENTTROUGH, GENTPEAK, GENTRANDOM, TOBRATROUGH, TOBRAPEAK, TOBRARND, AMIKACINPEAK, AMIKACINTROU, AMIKACIN,  in the last 72 hours   Microbiology: Recent Results (from the past 720 hour(s))  MRSA PCR SCREENING     Status: None   Collection Time    12/11/13  6:38 AM      Result Value Ref Range Status   MRSA by PCR NEGATIVE  NEGATIVE Final   Comment:            The GeneXpert MRSA Assay (FDA     approved for NASAL specimens     only), is one component of a     comprehensive MRSA colonization     surveillance program. It is not     intended to diagnose MRSA     infection nor to guide or     monitor treatment for     MRSA infections.    Anti-infectives   Start     Dose/Rate Route Frequency Ordered Stop   12/14/13 1800  levofloxacin (LEVAQUIN) tablet 500 mg     500 mg Oral Every 24 hours 12/14/13 1322     12/12/13 1600  levofloxacin (LEVAQUIN) IVPB 500 mg  Status:  Discontinued     500 mg 100 mL/hr over 60 Minutes Intravenous Every 24 hours 12/12/13 1511 12/14/13  1322   12/11/13 1000  doxycycline (VIBRA-TABS) tablet 100 mg  Status:  Discontinued     100 mg Oral Every 12 hours 12/11/13 0348 12/11/13 1555      Assessment: 52yom on Levaquin IV Day 3 for COPD exacerbation. Patient is currently afebrile, renal function is improving and patient is tolerating oral meds. Noted plans for 5 day course of Levaquin (expected last dose 9/8).   Plan:  1. Change Levaquin to 500mg  PO daily 2. Monitor renal function, cultures, clinical course and adjust as indicated  Earleen Newport [phone] 12/14/2013,1:22 PM

## 2013-12-14 NOTE — Progress Notes (Signed)
SATURATION QUALIFICATIONS: (This note is used to comply with regulatory documentation for home oxygen)  Patient Saturations on Room Air at Rest 94%  Patient Saturations on Room Air while Ambulating 82%  Patient Saturations on2Liters of oxygen while Ambulating 86%  Please briefly explain why patient needs home oxygen: Ambulate patient in hall way,O2 sat drop to 82%/RA with few steps, mild shortness of breath. 86%/2L while ambulating. Will continue to monitor patient.

## 2013-12-14 NOTE — Progress Notes (Signed)
Subjective:    [name] reports improvement of his breathing since we saw him last along with resolution of his lower extremity edema.  He continues to have a cough that is occasionally productive of clear sputum.  Interval Events: -Transferred to ICU on 12/11/13 to receive BiPAP. -Continued albuterol and ipratropium nebs. -Heparin discontinued. -CTA yesterday negative for PE, with severe centrilobular emphysema and mild airway thickening. -Started on levofloxacin. -Transferred back to med-surg yesterday. -Satting 93% on 4L nasal canula.   Objective:    Vital Signs:   Temp:  [98 F (36.7 C)-98.8 F (37.1 C)] 98 F (36.7 C) (09/06 0526) Pulse Rate:  [55-110] 99 (09/06 0526) Resp:  [15-35] 20 (09/06 0526) BP: (139-182)/(93-123) 175/100 mmHg (09/06 0526) SpO2:  [76 %-100 %] 95 % (09/06 0730) Weight:  [184 lb 4.9 oz (83.6 kg)-188 lb 11.4 oz (85.6 kg)] 184 lb 4.9 oz (83.6 kg) (09/06 0526) Last BM Date: 12/13/13  24-hour weight change: Weight change: -3 lb 12 oz (-1.7 kg)  Intake/Output:   Intake/Output Summary (Last 24 hours) at 12/14/13 0752 Last data filed at 12/14/13 0534  Gross per 24 hour  Intake    783 ml  Output   2750 ml  Net  -1967 ml      Physical Exam: General: Vital signs reviewed and noted. Well-developed, well-nourished, in no acute distress; alert, appropriate and cooperative throughout examination.  Lungs:  Expiratory wheezing bilaterally, improved air flow.  Heart: RRR, S1 and S2 normal without gallop, murmur, or rubs.  Abdomen:  BS normoactive. Soft, Nondistended, non-tender.  No masses or organomegaly.  Extremities: No edema.     Labs:  Basic Metabolic Panel:  Recent Labs Lab 12/11/13 0130 12/11/13 0800 12/12/13 0519 12/14/13 0336  NA 140 142 144 140  K 5.0 4.1 5.2 4.0  CL 100 99 99 92*  CO2 29 29 39* 39*  GLUCOSE 110* 177* 113* 76  BUN 19 17 15 21   CREATININE 1.67* 1.54* 1.34 1.17  CALCIUM 8.0* 8.0* 8.4 8.8    Liver Function  Tests:  Recent Labs Lab 12/11/13 0800  AST 24  ALT 28  ALKPHOS 88  BILITOT 0.4  PROT 6.5  ALBUMIN 2.8*   CBC:  Recent Labs Lab 12/11/13 0130 12/12/13 0519 12/14/13 0336  WBC 6.5 10.6* 16.6*  NEUTROABS 3.0  --   --   HGB 17.3* 16.5 17.6*  HCT 54.5* 56.0* 56.3*  MCV 80.0 85.4 81.2  PLT 165 166 175    Cardiac Enzymes:  Recent Labs Lab 12/11/13 0555 12/11/13 1152 12/11/13 1535  TROPONINI <0.30 <0.30 <0.30    CBG:  Recent Labs Lab 12/12/13 2210 12/13/13 0736 12/13/13 1635 12/13/13 2102 12/14/13 0559  GLUCAP 130* 123* 183* 75 102*    Microbiology: Results for orders placed during the hospital encounter of 12/11/13  MRSA PCR SCREENING     Status: None   Collection Time    12/11/13  6:38 AM      Result Value Ref Range Status   MRSA by PCR NEGATIVE  NEGATIVE Final   Comment:            The GeneXpert MRSA Assay (FDA     approved for NASAL specimens     only), is one component of a     comprehensive MRSA colonization     surveillance program. It is not     intended to diagnose MRSA     infection nor to guide or     monitor treatment  for     MRSA infections.    Imaging: Ct Angio Chest Pe W/cm &/or Wo Cm  12/13/2013   CLINICAL DATA:  COPD.  Pulmonary hypertension.  Dyspnea.  Cough.  EXAM: CT ANGIOGRAPHY CHEST WITH CONTRAST  TECHNIQUE: Multidetector CT imaging of the chest was performed using the standard protocol during bolus administration of intravenous contrast. Multiplanar CT image reconstructions and MIPs were obtained to evaluate the vascular anatomy.  CONTRAST:  23mL OMNIPAQUE IOHEXOL 350 MG/ML SOLN  COMPARISON:  None.  FINDINGS: No filling defect is identified in the pulmonary arterial tree to suggest pulmonary embolus. No acute aortic findings. Cardiomegaly noted particularly involving the right heart.  Upper normal size infrahilar lymph nodes. Mild atelectasis in both lower lobes and in the lingula. Mild airway thickening favoring in the lower lobes.   Thoracic spondylosis noted.  Severe centrilobular emphysema.  Review of the MIP images confirms the above findings.  IMPRESSION: 1. Severe centrilobular emphysema. 2. Mild airway thickening favoring the lower lobes, possibly a manifestation of bronchitis or reactive airways disease. 3. Mild atelectasis in both lower lobes in the lingula. 4. Cardiomegaly, with particular right heart enlargement.   Electronically Signed   By: Sherryl Barters M.D.   On: 12/13/2013 12:08     Medications:    Infusions:    Scheduled Medications: . antiseptic oral rinse  7 mL Mouth Rinse BID  . heparin subcutaneous  5,000 Units Subcutaneous 3 times per day  . insulin aspart  0-15 Units Subcutaneous TID WC  . insulin aspart  0-5 Units Subcutaneous QHS  . ipratropium-albuterol  3 mL Nebulization Q4H  . levofloxacin (LEVAQUIN) IV  500 mg Intravenous Q24H  . predniSONE  60 mg Oral Q breakfast  . sodium chloride  3 mL Intravenous Q12H    PRN Medications: acetaminophen, acetaminophen, albuterol   Assessment/ Plan:    Principal Problem:   COPD exacerbation Active Problems:   Tobacco abuse   CKD (chronic kidney disease), stage II   Acute congestive heart failure   Cocaine abuse   Hypertension   Acute respiratory failure with hypoxia and hypercapnia   Unspecified protein-calorie malnutrition  #Acute COPD exacerbation Severe pulmonary hypertension with chronic right heart strain.  CTA negative for PE.  Wheezing now audible on exam, likely absent previously due to poor air flow.  Now on 4L [place].  Slowly improving with steroids and breathing treatments.  Has not required home oxygen in the past, but may need now.  Lower extremity edema now resolved. -Supplemental oxygen with goal SPO2 88-92. -Albuterol nebulizer q2h PRN -Duonebs q4h -Levofloxacin 500 mg daily for five days ending  -Encourage smoking cessation. -Prednisone 60 mg daily. -Ambulate with pulse ox today. -Recommend LFTs as outpatient. -Pneumonia  and flu vaccine as outpatient.  #Acute on chronic CKD Unclear Cr baseline (1.18 in 2014).  Creatine improved to 1.54 this morning.  Will continue to monitor due to furosemide that was administered today. -BMP tomorrow morning.   #Hypertension Blood pressure consistently elevated since admission, now 175/100. -Start HCTZ 25 mg daily.   DVT PPX - heparin  CODE STATUS - Full code.  CONSULTS PLACED - None.  DISPO - Disposition is deferred at this time, awaiting improvement of hypoxia.   Anticipated discharge in approximately 2-3 day(s).   The patient does have a current PCP Hayes Ludwig, Sinclair Grooms, MD) and does need an [facility] hospital follow-up appointment after discharge.    Is the [facility] hospital follow-up appointment a one-time only appointment? no.  Does  the patient have transportation limitations that hinder transportation to clinic appointments? yes   SERVICE NEEDED AT [place]         Y = Yes, Blank = No PT:   OT:   RN:   Equipment:   Other:      Length of Stay: 3 day(s)   Signed: Arman Filter, MD  PGY-1, Internal Medicine Resident Pager: [phone] (7AM-5PM) 12/14/2013, 7:52 AM

## 2013-12-15 DIAGNOSIS — J449 Chronic obstructive pulmonary disease, unspecified: Secondary | ICD-10-CM

## 2013-12-15 DIAGNOSIS — I472 Ventricular tachycardia: Secondary | ICD-10-CM

## 2013-12-15 DIAGNOSIS — I4729 Other ventricular tachycardia: Secondary | ICD-10-CM

## 2013-12-15 HISTORY — DX: Chronic obstructive pulmonary disease, unspecified: J44.9

## 2013-12-15 LAB — BASIC METABOLIC PANEL
ANION GAP: 8 (ref 5–15)
BUN: 21 mg/dL (ref 6–23)
CALCIUM: 9.2 mg/dL (ref 8.4–10.5)
CHLORIDE: 90 meq/L — AB (ref 96–112)
CO2: 41 mEq/L (ref 19–32)
Creatinine, Ser: 1.25 mg/dL (ref 0.50–1.35)
GFR calc Af Amer: 74 mL/min — ABNORMAL LOW (ref >90)
GFR calc non Af Amer: 64 mL/min — ABNORMAL LOW (ref >90)
GLUCOSE: 80 mg/dL (ref 70–99)
Potassium: 4.3 mEq/L (ref 3.7–5.3)
SODIUM: 139 meq/L (ref 137–147)

## 2013-12-15 LAB — MAGNESIUM: Magnesium: 1.8 mg/dL (ref 1.5–2.5)

## 2013-12-15 LAB — GLUCOSE, CAPILLARY
GLUCOSE-CAPILLARY: 101 mg/dL — AB (ref 70–99)
Glucose-Capillary: 103 mg/dL — ABNORMAL HIGH (ref 70–99)

## 2013-12-15 MED ORDER — LISINOPRIL 10 MG PO TABS
10.0000 mg | ORAL_TABLET | Freq: Every day | ORAL | Status: DC
  Administered 2013-12-15: 10 mg via ORAL
  Filled 2013-12-15: qty 1

## 2013-12-15 MED ORDER — MOMETASONE FURO-FORMOTEROL FUM 100-5 MCG/ACT IN AERO
2.0000 | INHALATION_SPRAY | Freq: Two times a day (BID) | RESPIRATORY_TRACT | Status: DC
  Administered 2013-12-15: 2 via RESPIRATORY_TRACT
  Filled 2013-12-15: qty 8.8

## 2013-12-15 MED ORDER — TIOTROPIUM BROMIDE MONOHYDRATE 18 MCG IN CAPS
18.0000 ug | ORAL_CAPSULE | Freq: Every day | RESPIRATORY_TRACT | Status: DC
Start: 2013-12-15 — End: 2014-02-17

## 2013-12-15 MED ORDER — LISINOPRIL-HYDROCHLOROTHIAZIDE 10-12.5 MG PO TABS: 1.0000 | ORAL_TABLET | Freq: Every day | ORAL | Status: DC

## 2013-12-15 MED ORDER — ALBUTEROL SULFATE HFA 108 (90 BASE) MCG/ACT IN AERS: 2.0000 | INHALATION_SPRAY | RESPIRATORY_TRACT | Status: DC | PRN

## 2013-12-15 MED ORDER — FLUTICASONE-SALMETEROL 100-50 MCG/DOSE IN AEPB: 1.0000 | INHALATION_SPRAY | Freq: Two times a day (BID) | RESPIRATORY_TRACT | Status: DC

## 2013-12-15 MED ORDER — PREDNISONE 20 MG PO TABS: 60.0000 mg | ORAL_TABLET | Freq: Every day | ORAL | Status: AC

## 2013-12-15 MED ORDER — ALBUTEROL SULFATE (2.5 MG/3ML) 0.083% IN NEBU: 2.5000 mg | INHALATION_SOLUTION | RESPIRATORY_TRACT | Status: DC | PRN

## 2013-12-15 MED ORDER — MAGNESIUM SULFATE 40 MG/ML IJ SOLN
2.0000 g | Freq: Once | INTRAMUSCULAR | Status: AC
  Administered 2013-12-15: 2 g via INTRAVENOUS
  Filled 2013-12-15: qty 50

## 2013-12-15 MED ORDER — TIOTROPIUM BROMIDE MONOHYDRATE 18 MCG IN CAPS
18.0000 ug | ORAL_CAPSULE | Freq: Every day | RESPIRATORY_TRACT | Status: DC
  Administered 2013-12-15: 18 ug via RESPIRATORY_TRACT
  Filled 2013-12-15: qty 5

## 2013-12-15 MED ORDER — IPRATROPIUM-ALBUTEROL 0.5-2.5 (3) MG/3ML IN SOLN: 3.0000 mL | RESPIRATORY_TRACT | Status: DC | PRN

## 2013-12-15 MED ORDER — IPRATROPIUM-ALBUTEROL 20-100 MCG/ACT IN AERS: 2.0000 | INHALATION_SPRAY | RESPIRATORY_TRACT | Status: DC | PRN

## 2013-12-15 MED ORDER — LEVOFLOXACIN 500 MG PO TABS: 500.0000 mg | ORAL_TABLET | ORAL | Status: AC

## 2013-12-15 MED ORDER — IPRATROPIUM-ALBUTEROL 20-100 MCG/ACT IN AERS
2.0000 | INHALATION_SPRAY | RESPIRATORY_TRACT | Status: DC | PRN
Start: 2013-12-15 — End: 2013-12-15

## 2013-12-15 NOTE — Progress Notes (Signed)
Pt found to have Gratz in place but flowmeter was turned off. O2 sats were 79%. RT turned on O2 to 4 LPM and sats increased to 93%. Pt showed no signs of SOB during this. Will cont to monitor

## 2013-12-15 NOTE — Progress Notes (Signed)
SATURATION QUALIFICATIONS: (This note is used to comply with regulatory documentation for home oxygen)  Patient Saturations on Room Air at Rest = 86%  Patient Saturations on Room Air while Ambulating = 87%  Patient Saturations on 2 Liters of oxygen while Ambulating =94%  Please briefly explain why patient needs home oxygen: Patient desats on room air

## 2013-12-15 NOTE — Progress Notes (Signed)
Subjective:    [name] reports feeling well this morning with no SOB and minimal cough.  He denies fevers, chills, chest tightness, and says he is ready to go home.  Interval Events: -Oxygen weaned to 2L nasal canula, saturating 92% this morning. -Tried to wean to room air, sats 86%. -Ambulated with O2 sat 82% on room air, 86% on 2L. -Started on HCTZ 25 mg yesterday, BP 160/102 this morning.    Objective:    Vital Signs:   Temp:  [97.6 F (36.4 C)-98.5 F (36.9 C)] 97.6 F (36.4 C) (09/07 0551) Pulse Rate:  [92-103] 92 (09/07 0551) Resp:  [18-20] 18 (09/07 0551) BP: (154-170)/(94-127) 160/102 mmHg (09/07 0551) SpO2:  [86 %-98 %] 92 % (09/07 0819) Weight:  [182 lb 11.5 oz (82.88 kg)] 182 lb 11.5 oz (82.88 kg) (09/07 0551) Last BM Date: 12/14/13  24-hour weight change: Weight change: -5 lb 15.9 oz (-2.72 kg)  Intake/Output:   Intake/Output Summary (Last 24 hours) at 12/15/13 0924 Last data filed at 12/15/13 0640  Gross per 24 hour  Intake    603 ml  Output   3450 ml  Net  -2847 ml      Physical Exam: General: Vital signs reviewed and noted. Well-developed, well-nourished, in no acute distress; alert, appropriate and cooperative throughout examination.  Lungs:  Clear to auscultation bilaterally, no wheezing, rales, or rhonchi.  Heart: RRR, S1 and S2 normal without gallop, murmur, or rubs.  Abdomen:  BS normoactive. Soft, Nondistended, non-tender.  No masses or organomegaly.  Extremities: No edema.     Labs:  Basic Metabolic Panel:  Recent Labs Lab 12/11/13 0130 12/11/13 0800 12/12/13 0519 12/14/13 0336 12/15/13 0419  NA 140 142 144 140 139  K 5.0 4.1 5.2 4.0 4.3  CL 100 99 99 92* 90*  CO2 29 29 39* 39* 41*  GLUCOSE 110* 177* 113* 76 80  BUN 19 17 15 21 21   CREATININE 1.67* 1.54* 1.34 1.17 1.25  CALCIUM 8.0* 8.0* 8.4 8.8 9.2  MG  --   --   --   --  1.8    Liver Function Tests:  Recent Labs Lab 12/11/13 0800  AST 24  ALT 28  ALKPHOS 88    BILITOT 0.4  PROT 6.5  ALBUMIN 2.8*   CBC:  Recent Labs Lab 12/11/13 0130 12/12/13 0519 12/14/13 0336  WBC 6.5 10.6* 16.6*  NEUTROABS 3.0  --   --   HGB 17.3* 16.5 17.6*  HCT 54.5* 56.0* 56.3*  MCV 80.0 85.4 81.2  PLT 165 166 175    Cardiac Enzymes:  Recent Labs Lab 12/11/13 0555 12/11/13 1152 12/11/13 1535  TROPONINI <0.30 <0.30 <0.30    CBG:  Recent Labs Lab 12/14/13 0559 12/14/13 1121 12/14/13 1610 12/14/13 2106 12/15/13 0555  GLUCAP 102* 41 81 121* 101*    Microbiology: Results for orders placed during the hospital encounter of 12/11/13  MRSA PCR SCREENING     Status: None   Collection Time    12/11/13  6:38 AM      Result Value Ref Range Status   MRSA by PCR NEGATIVE  NEGATIVE Final   Comment:            The GeneXpert MRSA Assay (FDA     approved for NASAL specimens     only), is one component of a     comprehensive MRSA colonization     surveillance program. It is not     intended to diagnose  MRSA     infection nor to guide or     monitor treatment for     MRSA infections.    Imaging: Ct Angio Chest Pe W/cm &/or Wo Cm  12/13/2013   CLINICAL DATA:  COPD.  Pulmonary hypertension.  Dyspnea.  Cough.  EXAM: CT ANGIOGRAPHY CHEST WITH CONTRAST  TECHNIQUE: Multidetector CT imaging of the chest was performed using the standard protocol during bolus administration of intravenous contrast. Multiplanar CT image reconstructions and MIPs were obtained to evaluate the vascular anatomy.  CONTRAST:  35mL OMNIPAQUE IOHEXOL 350 MG/ML SOLN  COMPARISON:  None.  FINDINGS: No filling defect is identified in the pulmonary arterial tree to suggest pulmonary embolus. No acute aortic findings. Cardiomegaly noted particularly involving the right heart.  Upper normal size infrahilar lymph nodes. Mild atelectasis in both lower lobes and in the lingula. Mild airway thickening favoring in the lower lobes.  Thoracic spondylosis noted.  Severe centrilobular emphysema.  Review of  the MIP images confirms the above findings.  IMPRESSION: 1. Severe centrilobular emphysema. 2. Mild airway thickening favoring the lower lobes, possibly a manifestation of bronchitis or reactive airways disease. 3. Mild atelectasis in both lower lobes in the lingula. 4. Cardiomegaly, with particular right heart enlargement.   Electronically Signed   By: Sherryl Barters M.D.   On: 12/13/2013 12:08     Medications:    Infusions:    Scheduled Medications: . antiseptic oral rinse  7 mL Mouth Rinse BID  . heparin subcutaneous  5,000 Units Subcutaneous 3 times per day  . hydrochlorothiazide  25 mg Oral Daily  . insulin aspart  0-15 Units Subcutaneous TID WC  . insulin aspart  0-5 Units Subcutaneous QHS  . ipratropium-albuterol  3 mL Nebulization Q4H  . levofloxacin  500 mg Oral Q24H  . predniSONE  60 mg Oral Q breakfast  . sodium chloride  3 mL Intravenous Q12H    PRN Medications: acetaminophen, acetaminophen, albuterol   Assessment/ Plan:    Principal Problem:   COPD exacerbation Active Problems:   Tobacco abuse   CKD (chronic kidney disease), stage II   Cocaine abuse   Hypertension   Acute respiratory failure with hypoxia and hypercapnia   Unspecified protein-calorie malnutrition   Moderate to severe pulmonary hypertension  #Acute COPD exacerbation Breathing significantly improved today, but still requiring oxygen at baseline.  Will need home O2.  Had not been taking inhalers at home, and does not have insurance or access to medicines.  He will need close follow up. -Supplemental oxygen with goal SPO2 88-92. -Albuterol nebulizer q2h PRN -Duonebs q4h -Switch to hand-held inhalers today so he can bring home. -Levofloxacin 500 mg daily for five days ending 12/16/13. -Encourage smoking cessation. -Prednisone 60 mg daily for 14 days ending 12/24/13. -Home oxygen. -Recommend LFTs as outpatient. -Pneumonia and flu vaccine as outpatient.  #Acute on chronic CKD Creatine stable at  1.25 this morning. -At baseline.  #Hypertension Blood pressure slightly improved to 160/102 this morning after starting HCTZ. -Continue HCTZ 25 mg daily. -Start lisinopril 10 mg daily, plan combination pill at discharge.   DVT PPX - heparin  CODE STATUS - Full code.  CONSULTS PLACED - None.  DISPO - Disposition is deferred at this time, awaiting improvement of hypoxia.   Anticipated discharge in approximately 2-3 day(s).   The patient does have a current PCP Hayes Ludwig, Sinclair Grooms, MD) and does need an [facility] hospital follow-up appointment after discharge.    Is the [facility] hospital follow-up appointment  a one-time only appointment? no.  Does the patient have transportation limitations that hinder transportation to clinic appointments? yes   SERVICE NEEDED AT [place]         Y = Yes, Blank = No PT:   OT:   RN:   Equipment:   Other:      Length of Stay: 4 day(s)   Signed: Arman Filter, MD  PGY-1, Internal Medicine Resident Pager: [phone] (7AM-5PM) 12/15/2013, 9:24 AM

## 2013-12-15 NOTE — Progress Notes (Signed)
I personally met and examined the patient today. I have read Dr Shelva Majestic note from today and I have discussed the care plan for the patient. Please see the details of management in Dr Vivia Budge note. Patient clinically improved, ambulatory saturations dropping to 86-88%. If continues to do well, anticipate discharge tomorrow, with home oxygen.

## 2013-12-15 NOTE — Progress Notes (Signed)
Patient discharged home.  Patient verbalized understanding of discharge orders, medications, and follow up appointments.

## 2013-12-15 NOTE — Care Management Note (Signed)
    Page 1 of 1   12/15/2013     9:56:46 AM CARE MANAGEMENT NOTE 12/15/2013  Patient:  [name], [name]   Account Number:  [id number]  Date Initiated:  12/11/2013  Documentation initiated by:  Marvetta Gibbons  Subjective/Objective Assessment:   Pt admitted with COPD, CHF, resp distress     Action/Plan:   PTA pt lived at home with family-  PCP- Adele Barthel D   Anticipated DC Date:  12/15/2013   Anticipated DC Plan:  [place]  CM consult  [facility]      Choice offered to / List presented to:             Status of service:  In process, will continue to follow Medicare Important Message given?   (If response is "NO", the following Medicare IM given date fields will be blank) Date Medicare IM given:   Medicare IM given by:   Date Additional Medicare IM given:   Additional Medicare IM given by:    Discharge Disposition:    Per UR Regulation:  Reviewed for med. necessity/level of care/duration of stay  If discussed at [place] of Stay Meetings, dates discussed:   12/16/2013    Comments:  9/7  0945 debbie Julius Boniface rn,bsn spoke w pt. he does not have pcp. he is agreeable to [facility]. they are closed today. sent them email to ck w pt about appt. enc pt to go by tomorrow and sched appt. gave pt match letter to get meds filled at low cost. gave pt 2 prescription discount cards that may help if has brand name meds. gave pt brochure on [facility] and list of other [place] clinics.

## 2013-12-15 NOTE — Progress Notes (Addendum)
IMTS Night Float Interim Progress Note  S:  Called by Ronnald Ramp, RN that patient had a 35 beat run of Vtach on telemetry around 3:30am.  Was asymptomatic and patient is sleeping.  Responded to floor, reviewed telemetry and found patient sleeping.  Patient reported no palpitations or other complaints.  Review of chart shows HCTZ was started this AM.  O: Gen: patient resting in bed in NAD Cardiac: RRR, no appreciated murmur Resp: CTAB  A: Nonsustained Ventricular Tachycardia.  P: -Obtain EKG:  Reviewed : NSR rate 91 TWI in precordial leads roughly unchanged from admission EKG. - Obtain BMP, Mag. - Continue to monitor on telemetry, may need BB if symptomatic.  Will defer consultation of cardiology if needed to day team.  Lucious Groves, DO 5:04 AM IMTS PGY-2  ADDENDUM: Results back: bicarb increasing slightly, potassium wnl.  Mag at 1.8. Spoke with patient's RN, will turn down O2 to try to keep patient O2 sat 90-92%.  (was recently 96-97% on 2L via [place]).  Magnesium: Patient low normal with mag of 1.8 given his Vtach earlier I will supplement him with 2g of IV mag to try to get mag >2mg /dL.

## 2013-12-15 NOTE — Progress Notes (Signed)
Patient resting quietly. No complaints of pain or SOB.

## 2013-12-15 NOTE — Discharge Instructions (Addendum)
·   Thank you for allowing [place] to be involved in your healthcare while you were hospitalized at [facility].   Please note that there have been changes to your home medications.  --> PLEASE LOOK AT YOUR DISCHARGE MEDICATION LIST FOR DETAILS.  Please return to the ER if you have worsening of your symptoms or new severe symptoms arise.  The best thing you can do to improve your breathing is to stop smoking.  Call our clinic at [phone] if you have any questions or trouble getting your medications.  You need to keep up with your inhalers every day to prevent being hospitalized again.  Please establish with a PCP who can manage your medications.

## 2013-12-15 NOTE — Discharge Summary (Signed)
Name: [name] [name] MRN: [id number] DOB: 12/09/1960 53 y.o. PCP: Blain Pais, MD  Date of Admission: 12/11/2013  1:20 AM Date of Discharge: 12/15/2013 Attending Physician: Madilyn Fireman, MD  Discharge Diagnosis:  Principal Problem:   COPD exacerbation Active Problems:   Tobacco abuse   CKD (chronic kidney disease), stage II   Cocaine abuse   Hypertension   Acute respiratory failure with hypoxia and hypercapnia   Unspecified protein-calorie malnutrition   Moderate to severe pulmonary hypertension  Discharge Medications:   Medication List         albuterol 108 (90 BASE) MCG/ACT inhaler  Commonly known as:  PROVENTIL HFA;VENTOLIN HFA  Inhale 2 puffs into the lungs every 4 (four) hours as needed for wheezing or shortness of breath.     Fluticasone-Salmeterol 100-50 MCG/DOSE Aepb  Commonly known as:  ADVAIR  Inhale 1 puff into the lungs 2 (two) times daily.     ibuprofen 200 MG tablet  Commonly known as:  ADVIL,MOTRIN  Take 800 mg by mouth every 8 (eight) hours as needed for headache.     levofloxacin 500 MG tablet  Commonly known as:  LEVAQUIN  Take 1 tablet (500 mg total) by mouth daily.     lisinopril-hydrochlorothiazide 10-12.5 MG per tablet  Commonly known as:  PRINZIDE,ZESTORETIC  Take 1 tablet by mouth daily.     predniSONE 20 MG tablet  Commonly known as:  DELTASONE  Take 3 tablets (60 mg total) by mouth daily with breakfast.     tiotropium 18 MCG inhalation capsule  Commonly known as:  SPIRIVA  Place 1 capsule (18 mcg total) into inhaler and inhale daily.        Disposition and follow-up:   HowardHoward [name] was discharged from [facility] in Stable condition.  At the hospital follow up visit please address:  1.  Access to COPD medications and compliance, titrate up blood pressure medications, Pneumovax, flu vaccine, assess oxygen requirement, establish with PCP.  2.  Labs / imaging needed at time of follow-up: BMP, PFTs.  3.   Pending labs/ test needing follow-up: None.  Follow-up Appointments: Follow-up Information   Follow up with [facility]. Schedule an appointment as soon as possible for a visit in 1 week. (Call tomorrow to make an appointment later this week.)    Contact information:   [address] [place] [address] [phone]      Discharge Instructions:  Thank you for allowing [place] to be involved in your healthcare while you were hospitalized at [facility].   Please note that there have been changes to your home medications.  --> PLEASE LOOK AT YOUR DISCHARGE MEDICATION LIST FOR DETAILS.  Please return to the ER if you have worsening of your symptoms or new severe symptoms arise.  The best thing you can do to improve your breathing is to stop smoking.  Call our clinic at [phone] if you have any questions or trouble getting your medications.  You need to keep up with your inhalers every day to prevent being hospitalized again.  Please establish with a PCP who can manage your medications. Discharge Instructions   Call MD for:  difficulty breathing, headache or visual disturbances    Complete by:  As directed      Call MD for:  extreme fatigue    Complete by:  As directed      Call MD for:  persistant dizziness or light-headedness  Complete by:  As directed      Call MD for:  persistant nausea and vomiting    Complete by:  As directed      Call MD for:  temperature >100.4    Complete by:  As directed      Diet - low sodium heart healthy    Complete by:  As directed      Increase activity slowly    Complete by:  As directed            Consultations: PCCM.  Procedures Performed:  Dg Chest 2 View  12/11/2013   CLINICAL DATA:  Respiratory distress  EXAM: CHEST  2 VIEW  COMPARISON:  07/28/2012  FINDINGS: Enlarged cardiac silhouette. Aortic tortuosity. Central vascular congestion. Interstitial coarsening/prominence. Mild lung base  opacities. No definite pleural effusion. No pneumothorax. Mild multilevel degenerative change.  IMPRESSION: Enlarged cardiac silhouette with central vascular congestion.  Interstitial prominence in part reflects chronic change. Superimposed interstitial edema not excluded.  Lung base opacities, favor atelectasis and/or scarring.   Electronically Signed   By: Carlos Levering M.D.   On: 12/11/2013 02:02   Ct Angio Chest Pe W/cm &/or Wo Cm  12/13/2013   CLINICAL DATA:  COPD.  Pulmonary hypertension.  Dyspnea.  Cough.  EXAM: CT ANGIOGRAPHY CHEST WITH CONTRAST  TECHNIQUE: Multidetector CT imaging of the chest was performed using the standard protocol during bolus administration of intravenous contrast. Multiplanar CT image reconstructions and MIPs were obtained to evaluate the vascular anatomy.  CONTRAST:  46mL OMNIPAQUE IOHEXOL 350 MG/ML SOLN  COMPARISON:  None.  FINDINGS: No filling defect is identified in the pulmonary arterial tree to suggest pulmonary embolus. No acute aortic findings. Cardiomegaly noted particularly involving the right heart.  Upper normal size infrahilar lymph nodes. Mild atelectasis in both lower lobes and in the lingula. Mild airway thickening favoring in the lower lobes.  Thoracic spondylosis noted.  Severe centrilobular emphysema.  Review of the MIP images confirms the above findings.  IMPRESSION: 1. Severe centrilobular emphysema. 2. Mild airway thickening favoring the lower lobes, possibly a manifestation of bronchitis or reactive airways disease. 3. Mild atelectasis in both lower lobes in the lingula. 4. Cardiomegaly, with particular right heart enlargement.   Electronically Signed   By: Sherryl Barters M.D.   On: 12/13/2013 12:08   2D Echo:  Study Conclusions: - Left ventricle: The cavity size was normal. There was moderate concentric hypertrophy. Systolic function was vigorous. The estimated ejection fraction was in the range of 65% to 70%. Wall motion was normal; there were no  regional wall motion abnormalities. Doppler parameters are consistent with abnormal left ventricular relaxation (grade 1 diastolic dysfunction). - Left atrium: The atrium was moderately dilated. - Right ventricle: The cavity size was dilated. - Right atrium: The atrium was dilated. - Pulmonary arteries: Systolic pressure was moderately to severely increased. PA peak pressure: 72 mm Hg (S).  Admission HPI:  [name] [name] is a 53 year old gentleman with a history of gout, stage II chronic kidney disease, and COPD (though no PFTs), who presents with 3-4 days of worsening dyspnea and cough productive of clear sputum. He denies any sick contacts.   Patient states that he ran out of his inhaler 3-4 days ago and that he has not been seen in our clinic since May 2014. Patient denies any fever, chest pain, abdominal pain, nausea, vomiting, constipation, diarrhea, dysuria, or hematuria. Patient does report having some swelling in his ankles, but denies one leg swelling [name] [name] than  the other. Patient says that he is a current smoker, smoking one pack every 2-3 days. His last drink of alcohol was about a week ago, and he denies any previous problems with alcohol withdrawal. Patient also endorses use of crack cocaine 2-3 weeks ago.  Patient was initially found to be satting in the 70s by EMS, who gave him albuterol, Atrovent, 125 mg Solu-Medrol, with successful improvement of his saturations to 98%.  Hospital Course by problem list: Principal Problem:   COPD exacerbation Active Problems:   Tobacco abuse   CKD (chronic kidney disease), stage II   Cocaine abuse   Hypertension   Acute respiratory failure with hypoxia and hypercapnia   Unspecified protein-calorie malnutrition   Moderate to severe pulmonary hypertension   #COPD exacerbation [name] [name] carries a diagnosis of COPD, but he has never had pulmonary function tests.  He was [facility] over a year prior to admission for hospital follow up, but  he has not established with a PCP since then.  He reported not taking his COPD medications for several months and presented with worsening dyspnea and cough.  He was found to be tachycardic and hypoxic to the 70s requiring non-rebreather mask.  In addition, edema was noted in his lower extremities.  He was given IV steroid and nebulizers with minimal improvement of his breathing, so was initially given Lasix due to concern for heart failure. Eventually, TTE showed chronic right heart strain and pulmonary hypertension, but no evidence of systolic dysfunction, so Lasix was discontinued.  At that time, he was started on a heparin drip due to concern for possible pulmonary embolism, and PCCM was consulted for further evaluation.  They felt his symptoms were most consistent with acute COPD exacerbation and recommended admitting him to the ICU for BiPAP and [name] [name] frequent nebulizers and steroids.  D-dimer ultimately came back slightly elevated, but lower extremity doppler were negative.  After resolution of his AKI, CTA was performed that showed severe emphysema and no evidence of PE, so heparin was stopped.  [name] [name] breathing slowly improved on BiPAP, and he was slowly weaned to nasal canula and returned to the floor.  He was started on levofloxacin for COPD exacerbation and switched to prednisone 60 mg daily. He was given prescriptions on discharge to complete a 5 day course of levofloxacin (ending 12/16/13), and a 14 day course of prednisone (ending 12/24/13).  On discharge, he was requiring 2L of oxygen via nasal canula at rest, so home oxygen was arranged.  He was prescribed Advair, tiotropium, and albuterol inhalers and these inhalers were provided to him on discharge.  [name] [name] was encouraged to stop smoking to prevent further progression of his COPD.  We recommend pulmonary function tests as an outpatient to establish the diagnosis of COPD and determine his lung function at this time.  #Acute on chronic  CKD [name] [name] creatinine was elevated to 1.67 on admission, up from 1.18 in 2014.  This was likely due to poor perfusion in the setting of his pulmonary hypertension and right hear strain.  After treating his COPD, his creatinine improved to his baseline and was 1.25 on discharge.  His CKD is likely due to his chronic hypertension.  #Hypertension [name] [name] had not been taking any medications for his elevated blood pressure as an outpatient, but he has not had much contact with the medical system.  His blood pressures were consistently elevated during the admission, so he was started on lisinopril 10 mg  and HCTZ 25 mg daily with improvement of his blood pressure.  To make his medication [name] [name] affordable, he was discharged on lisinopril-HCTZ 10-12.5 combination pill.  This may need to be titrated up as an outpatient.  #Non-sustained ventricular tachycardia [name] [name] had one non-symptomatic episode of 35 beats of non-sustained ventricular tachycardia during the hospitalization.  His electrolytes were normal with a Mg of 1.8, so he was given 2 g of IV Mg without any additional episodes.  Discharge Vitals:   BP 142/106  Pulse 97  Temp(Src) 98.2 F (36.8 C) (Oral)  Resp 18  Ht 5\' 6"  (1.676 m)  Wt 182 lb 11.5 oz (82.88 kg)  BMI 29.51 kg/m2  SpO2 92%  Discharge Labs:  Results for orders placed during the hospital encounter of 12/11/13 (from the past 24 hour(s))  GLUCOSE, CAPILLARY     Status: None   Collection Time    12/14/13  4:10 PM      Result Value Ref Range   Glucose-Capillary 81  70 - 99 mg/dL   Comment 1 Documented in Chart     Comment 2 Notify RN    GLUCOSE, CAPILLARY     Status: Abnormal   Collection Time    12/14/13  9:06 PM      Result Value Ref Range   Glucose-Capillary 121 (*) 70 - 99 mg/dL  BASIC METABOLIC PANEL     Status: Abnormal   Collection Time    12/15/13  4:19 AM      Result Value Ref Range   Sodium 139  137 - 147 mEq/L   Potassium 4.3  3.7 - 5.3 mEq/L    Chloride 90 (*) 96 - 112 mEq/L   CO2 41 (*) 19 - 32 mEq/L   Glucose, Bld 80  70 - 99 mg/dL   BUN 21  6 - 23 mg/dL   Creatinine, Ser 1.25  0.50 - 1.35 mg/dL   Calcium 9.2  8.4 - 10.5 mg/dL   GFR calc non Af Amer 64 (*) >90 mL/min   GFR calc Af Amer 74 (*) >90 mL/min   Anion gap 8  5 - 15  MAGNESIUM     Status: None   Collection Time    12/15/13  4:19 AM      Result Value Ref Range   Magnesium 1.8  1.5 - 2.5 mg/dL  GLUCOSE, CAPILLARY     Status: Abnormal   Collection Time    12/15/13  5:55 AM      Result Value Ref Range   Glucose-Capillary 101 (*) 70 - 99 mg/dL  GLUCOSE, CAPILLARY     Status: Abnormal   Collection Time    12/15/13 11:16 AM      Result Value Ref Range   Glucose-Capillary 103 (*) 70 - 99 mg/dL   Comment 1 Notify RN      Signed: Arman Filter, MD 12/15/2013, 2:48 PM    Services Ordered on Discharge: None. Equipment Ordered on Discharge: Home oxygen.

## 2013-12-22 ENCOUNTER — Encounter: Payer: Self-pay | Admitting: Family Medicine

## 2013-12-22 ENCOUNTER — Ambulatory Visit: Payer: Self-pay | Attending: Family Medicine | Admitting: Family Medicine

## 2013-12-22 VITALS — BP 128/86 | HR 84 | Temp 97.8°F | Resp 18 | Ht 67.0 in | Wt 192.8 lb

## 2013-12-22 DIAGNOSIS — Z23 Encounter for immunization: Secondary | ICD-10-CM

## 2013-12-22 DIAGNOSIS — M109 Gout, unspecified: Secondary | ICD-10-CM | POA: Insufficient documentation

## 2013-12-22 DIAGNOSIS — J4489 Other specified chronic obstructive pulmonary disease: Secondary | ICD-10-CM | POA: Insufficient documentation

## 2013-12-22 DIAGNOSIS — J449 Chronic obstructive pulmonary disease, unspecified: Secondary | ICD-10-CM

## 2013-12-22 DIAGNOSIS — I10 Essential (primary) hypertension: Secondary | ICD-10-CM | POA: Insufficient documentation

## 2013-12-22 DIAGNOSIS — Z87891 Personal history of nicotine dependence: Secondary | ICD-10-CM | POA: Insufficient documentation

## 2013-12-22 DIAGNOSIS — IMO0001 Reserved for inherently not codable concepts without codable children: Secondary | ICD-10-CM

## 2013-12-22 NOTE — Addendum Note (Signed)
Addended by: Betti Cruz on: 12/22/2013 03:05 PM   Modules accepted: Orders

## 2013-12-22 NOTE — Patient Instructions (Addendum)
[name] [name],  Thank you for coming in today. It was a pleasure meeting you. I look forward to be a primary doctor.  1. For COPD Finish levaquin and prednisone. Continue oxygen therapy.  Continue daily spiriva and albuterol as needed. Smoking cessation support: smoking cessation hotline: 1-800-QUIT-NOW.  Smoking cessation classes are available through [facility]. Call [phone] or visit our website at [email or web address]  Obtain pulmonary function test: at the [facility].   2. HTN: BP at goal. Continue current meds. F/u BMP today.   F/u with me in 3-4 weeks  Dr. Adrian Blackwater

## 2013-12-22 NOTE — Assessment & Plan Note (Signed)
A: COPD on O2 since hospitalization. Sats drop to 87 % during ambulation. P:  PFTs Finish levaquin and prednisone  Continue spiriva and albuterol

## 2013-12-22 NOTE — Progress Notes (Signed)
   Subjective:    Patient ID: [name] [name], male    DOB: 12-06-1960, 53 y.o.   MRN: [id number] CC: establish care, hospital and f/u for COPD exacerbation  HPI 53 year old male presents to establish care discussed the following:  #1 COPD: Patient received diagnosed with COPD following hospitalization for COPD exacerbation. At discharge she was sent home 2 L of oxygen via nasal cannula. Patient was a previous heavy smoker has quit. Patient also previous cocaine abuser and has quit. Patient reports chronic cough that is intimately productive of sputum and is worse at night. Patient denies fever, chills, chest pain. Patient has no shortness of breath his oxygen. Patient does have shortness of breath off oxygen when he ambulates greater than half a block.  #2 hypertension: Patient's compliant with her diet. Denies chest pain, shortness of breath.  Social history: Previously heavy smoker quit. Previous cocaine abuse quit. Review of Systems As per history of present illness    Objective:   Physical Exam BP 128/86  Pulse 84  Temp(Src) 97.8 F (36.6 C) (Oral)  Resp 18  Ht 5\' 7"  (1.702 m)  Wt 192 lb 12.8 oz (87.454 kg)  BMI 30.19 kg/m2  SpO2 95% on 2 L [place], 87 % on RA during ambulation.  General appearance: alert, cooperative and no distress, appears older than stated age  Lungs:  Slight increased WOB, no crackles or rales Heart: regular rate and rhythm, S1, S2 normal, no murmur, click, rub or gallop     Assessment & Plan:

## 2013-12-22 NOTE — Assessment & Plan Note (Addendum)
A: BP at goal Meds: compliant P: Continue current regimen F/u BMP   Elevated uric acid Patient should not take HCTZ due to risk of gout attack. Stop prinzide Start lisinopril 20 mg daily sent to on site pharmacy.

## 2013-12-22 NOTE — Assessment & Plan Note (Addendum)
A: h/o gout. Currently on HCTZ. P: check uric acid  Elevated uric acid Patient should not take HCTZ due to risk of gout attack. Stop prinzide Start lisinopril 20 mg daily sent to on site pharmacy.

## 2013-12-22 NOTE — Progress Notes (Signed)
Establish Care HFU COPD Pt Complaining of numbness on lt 5th finger

## 2013-12-23 ENCOUNTER — Telehealth: Payer: Self-pay | Admitting: *Deleted

## 2013-12-23 LAB — URIC ACID: Uric Acid, Serum: 10.5 mg/dL — ABNORMAL HIGH (ref 4.0–7.8)

## 2013-12-23 MED ORDER — LISINOPRIL 20 MG PO TABS: 20.0000 mg | ORAL_TABLET | Freq: Every day | ORAL | Status: DC

## 2013-12-23 NOTE — Telephone Encounter (Signed)
Pt aware of lab results, Medication Lisinopril at our pharmacy

## 2013-12-23 NOTE — Addendum Note (Signed)
Addended by: Boykin Nearing on: 12/23/2013 02:00 PM   Modules accepted: Orders, Medications

## 2013-12-23 NOTE — Telephone Encounter (Signed)
Message copied by Betti Cruz on Tue Dec 23, 2013  5:09 PM ------      Message from: Boykin Nearing      Created: Tue Dec 23, 2013  1:59 PM       Elevated uric acid      Patient should not take HCTZ due to risk of gout attack.      Stop prinzide      Start lisinopril 20 mg daily sent to on site pharmacy. ------

## 2014-01-16 ENCOUNTER — Ambulatory Visit: Payer: Self-pay

## 2014-01-29 ENCOUNTER — Ambulatory Visit: Payer: Self-pay | Admitting: Family Medicine

## 2014-02-17 ENCOUNTER — Ambulatory Visit: Payer: Self-pay | Attending: Family Medicine | Admitting: Family Medicine

## 2014-02-17 ENCOUNTER — Encounter: Payer: Self-pay | Admitting: Family Medicine

## 2014-02-17 VITALS — BP 130/98 | HR 89 | Temp 98.3°F | Resp 16 | Ht 67.0 in | Wt 191.0 lb

## 2014-02-17 DIAGNOSIS — J449 Chronic obstructive pulmonary disease, unspecified: Secondary | ICD-10-CM

## 2014-02-17 DIAGNOSIS — IMO0001 Reserved for inherently not codable concepts without codable children: Secondary | ICD-10-CM

## 2014-02-17 DIAGNOSIS — I10 Essential (primary) hypertension: Secondary | ICD-10-CM | POA: Insufficient documentation

## 2014-02-17 DIAGNOSIS — Z7722 Contact with and (suspected) exposure to environmental tobacco smoke (acute) (chronic): Secondary | ICD-10-CM | POA: Insufficient documentation

## 2014-02-17 DIAGNOSIS — J209 Acute bronchitis, unspecified: Secondary | ICD-10-CM | POA: Insufficient documentation

## 2014-02-17 DIAGNOSIS — Z87891 Personal history of nicotine dependence: Secondary | ICD-10-CM | POA: Insufficient documentation

## 2014-02-17 DIAGNOSIS — J410 Simple chronic bronchitis: Secondary | ICD-10-CM

## 2014-02-17 DIAGNOSIS — J440 Chronic obstructive pulmonary disease with acute lower respiratory infection: Secondary | ICD-10-CM | POA: Insufficient documentation

## 2014-02-17 MED ORDER — LOSARTAN POTASSIUM 50 MG PO TABS: 50.0000 mg | ORAL_TABLET | Freq: Every day | ORAL | Status: DC

## 2014-02-17 MED ORDER — FLUTICASONE-SALMETEROL 100-50 MCG/DOSE IN AEPB: 1.0000 | INHALATION_SPRAY | Freq: Two times a day (BID) | RESPIRATORY_TRACT | Status: DC

## 2014-02-17 MED ORDER — ALBUTEROL SULFATE HFA 108 (90 BASE) MCG/ACT IN AERS: 2.0000 | INHALATION_SPRAY | RESPIRATORY_TRACT | Status: DC | PRN

## 2014-02-17 MED ORDER — TIOTROPIUM BROMIDE MONOHYDRATE 18 MCG IN CAPS: 18.0000 ug | ORAL_CAPSULE | Freq: Every day | RESPIRATORY_TRACT | Status: DC

## 2014-02-17 MED ORDER — MOMETASONE FURO-FORMOTEROL FUM 100-5 MCG/ACT IN AERO: 2.0000 | INHALATION_SPRAY | Freq: Two times a day (BID) | RESPIRATORY_TRACT | Status: DC

## 2014-02-17 NOTE — Progress Notes (Signed)
F/U COPD Medicine refills. Referral for pulmonology

## 2014-02-17 NOTE — Patient Instructions (Signed)
Mr. Lamarque,  Thank you for coming in today. I have refilled your inhalers.  I have changed from lisinopril 20 daily to losartan 50 daily with goal of bp < 140/90 and improvement in dry cough.  F/u with RN for BP check in 2-3 weeks  F/u with me in 3 months  Dr. Adrian Blackwater

## 2014-02-17 NOTE — Progress Notes (Signed)
   Subjective:    Patient ID: [name] [name], male    DOB: January 21, 1961, 53 y.o.   MRN: [id number] CC: f/u COPD HPI 53 yo M presents for f/u visit:  1. COPD: ran out of spiriva. Has dry cough and intermittent SOB. No CP or fever. Using O2 most of the time. Has cut cigarettes and cocaine, doing well. Is exposed to second-hand smoke at home.   2. HTN: compliant with lisinopril daily. Denies HA, vision change, CP, SOB. Admits to dry cough.   Soc Hx: former smoker, quit in 12/15/13.  Review of Systems As per HPI     Objective:   Physical Exam BP 146/99 mmHg  Pulse 89  Temp(Src) 98.3 F (36.8 C) (Oral)  Resp 16  Ht 5\' 7"  (1.702 m)  Wt 191 lb (86.637 kg)  BMI 29.91 kg/m2  SpO2 93% on Ra 95-96% on 2 L [place]  General appearance: alert, cooperative and no distress Lungs: clear to auscultation bilaterally Heart: regular rate and rhythm, S1, S2 normal, no murmur, click, rub or gallop       Assessment & Plan:

## 2014-02-17 NOTE — Assessment & Plan Note (Signed)
A: BP above goal Meds: compliant, dry cough P: Change from lisinopril 20 mg daily to losartan 50 mg daily  RN BP check in 3 weeks if elevated increase losartan to 100 mg daily

## 2014-02-17 NOTE — Assessment & Plan Note (Signed)
A: improved. Patient's is not desating on room air. P:  Supplemental O2 prn and at night. 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

## 2014-06-04 ENCOUNTER — Other Ambulatory Visit: Payer: Self-pay | Admitting: Family Medicine

## 2014-08-20 ENCOUNTER — Ambulatory Visit: Payer: MEDICAID | Attending: Family Medicine | Admitting: Family Medicine

## 2014-08-20 ENCOUNTER — Encounter: Payer: Self-pay | Admitting: Family Medicine

## 2014-08-20 VITALS — BP 140/80 | HR 77 | Temp 98.3°F | Resp 18 | Ht 67.0 in | Wt 182.0 lb

## 2014-08-20 DIAGNOSIS — Z0000 Encounter for general adult medical examination without abnormal findings: Secondary | ICD-10-CM

## 2014-08-20 DIAGNOSIS — J449 Chronic obstructive pulmonary disease, unspecified: Secondary | ICD-10-CM

## 2014-08-20 DIAGNOSIS — Z87891 Personal history of nicotine dependence: Secondary | ICD-10-CM | POA: Insufficient documentation

## 2014-08-20 DIAGNOSIS — M109 Gout, unspecified: Secondary | ICD-10-CM

## 2014-08-20 DIAGNOSIS — J410 Simple chronic bronchitis: Secondary | ICD-10-CM

## 2014-08-20 DIAGNOSIS — I10 Essential (primary) hypertension: Secondary | ICD-10-CM

## 2014-08-20 DIAGNOSIS — IMO0001 Reserved for inherently not codable concepts without codable children: Secondary | ICD-10-CM

## 2014-08-20 DIAGNOSIS — Z7951 Long term (current) use of inhaled steroids: Secondary | ICD-10-CM | POA: Insufficient documentation

## 2014-08-20 LAB — CBC
HCT: 45.5 % (ref 39.0–52.0)
Hemoglobin: 14.3 g/dL (ref 13.0–17.0)
MCH: 24 pg — AB (ref 26.0–34.0)
MCHC: 31.4 g/dL (ref 30.0–36.0)
MCV: 76.3 fL — ABNORMAL LOW (ref 78.0–100.0)
MPV: 10.5 fL (ref 8.6–12.4)
Platelets: 219 10*3/uL (ref 150–400)
RBC: 5.96 MIL/uL — ABNORMAL HIGH (ref 4.22–5.81)
RDW: 14.2 % (ref 11.5–15.5)
WBC: 7.5 10*3/uL (ref 4.0–10.5)

## 2014-08-20 MED ORDER — FLUTICASONE-SALMETEROL 100-50 MCG/DOSE IN AEPB: 1.0000 | INHALATION_SPRAY | Freq: Two times a day (BID) | RESPIRATORY_TRACT | Status: DC

## 2014-08-20 MED ORDER — ALBUTEROL SULFATE HFA 108 (90 BASE) MCG/ACT IN AERS: 2.0000 | INHALATION_SPRAY | RESPIRATORY_TRACT | Status: DC | PRN

## 2014-08-20 NOTE — Assessment & Plan Note (Signed)
COPD: controlled Continue spiriva twice daily Albuterol as needed

## 2014-08-20 NOTE — Patient Instructions (Signed)
[name] [name],   Thank you for coming in today  1. COPD: controlled Continue spiriva twice daily Albuterol as needed  2. HTN: BP well controlled Continue losartan 50 daily   3. Healthcare maintenance: due for screening colonoscopy, GI referral placed  CBC, BMP, uric acid today You will be called with lab results  F/u in 3 months  Dr. Adrian Blackwater

## 2014-08-20 NOTE — Progress Notes (Signed)
   Subjective:    Patient ID: [name] [name], male    DOB: 1960/12/23, 54 y.o.   MRN: [id number] CC: f/u COPD  HPI  1. COPD He presents for evaluation and treatment of COPD. The patient is not currently have symptoms / an exacerbation. The patient has COPD for approximately 2 years. Symptoms in previous episodes have included dyspnea, cough, wheezing and increased sputum, and typically last 3 days. Previous episodes have been exacerbated by smoking. Current treatment includes albuterol inhaler and salmeterol/fluticasone inhaler, which generally provides almost complete resolution of symptoms.  He uses 1 pillows at night. Patient currently is not on home oxygen therapy.. The patient is having no constitutional symptoms, denying fever, chills, anorexia, or weight loss. The patient has been hospitalized for this condition before. Patient has quit smoking.   2. CHRONIC HYPERTENSION  Disease Monitoring  Blood pressure range: not checking   Chest pain: no   Dyspnea: yes   Claudication: no   Medication compliance: yes  Medication Side Effects  Lightheadedness: no   Urinary frequency: no   Edema: no    Preventitive Healthcare:  Exercise: no   Diet Pattern: low calorie due to hx of morbid obesity   Salt Restriction: yes   3. Health care maintenance: due for colonoscopy  Soc Hx: former smoker, quit  Review of Systems  Constitutional: Positive for appetite change.  Respiratory: Positive for cough. Negative for shortness of breath.   Cardiovascular: Negative for chest pain, palpitations and leg swelling.       Objective:   Physical Exam BP 140/80 mmHg  Pulse 77  Temp(Src) 98.3 F (36.8 C) (Oral)  Resp 18  Ht 5\' 7"  (1.702 m)  Wt 182 lb (82.555 kg)  BMI 28.50 kg/m2  SpO2 94%  Wt Readings from Last 3 Encounters:  08/20/14 182 lb (82.555 kg)  02/17/14 191 lb (86.637 kg)  12/22/13 192 lb 12.8 oz (87.454 kg)    General appearance: alert, cooperative and no distress Lungs: clear to  auscultation bilaterally Heart: regular rate and rhythm, S1, S2 normal, no murmur, click, rub or gallop Extremities: extremities normal, atraumatic, no cyanosis or edema       Assessment & Plan:

## 2014-08-20 NOTE — Assessment & Plan Note (Signed)
Healthcare maintenance: due for screening colonoscopy, GI referral placed

## 2014-08-20 NOTE — Progress Notes (Signed)
F/U COPD No tobacco user since 2015

## 2014-08-20 NOTE — Assessment & Plan Note (Signed)
HTN: BP well controlled Continue losartan 50 daily

## 2014-08-21 LAB — BASIC METABOLIC PANEL
BUN: 11 mg/dL (ref 6–23)
CO2: 28 mEq/L (ref 19–32)
Calcium: 9.3 mg/dL (ref 8.4–10.5)
Chloride: 101 mEq/L (ref 96–112)
Creat: 1.48 mg/dL — ABNORMAL HIGH (ref 0.50–1.35)
GLUCOSE: 59 mg/dL — AB (ref 70–99)
Potassium: 4.7 mEq/L (ref 3.5–5.3)
Sodium: 138 mEq/L (ref 135–145)

## 2014-08-21 LAB — URIC ACID: Uric Acid, Serum: 8.1 mg/dL — ABNORMAL HIGH (ref 4.0–7.8)

## 2014-09-23 ENCOUNTER — Ambulatory Visit: Payer: Self-pay | Attending: Family Medicine

## 2014-10-13 ENCOUNTER — Encounter: Payer: Self-pay | Admitting: Family Medicine

## 2014-10-13 ENCOUNTER — Ambulatory Visit: Payer: Self-pay | Attending: Family Medicine | Admitting: Family Medicine

## 2014-10-13 VITALS — BP 164/95 | HR 89 | Temp 98.3°F | Resp 18 | Ht 68.0 in | Wt 187.2 lb

## 2014-10-13 DIAGNOSIS — M109 Gout, unspecified: Secondary | ICD-10-CM

## 2014-10-13 DIAGNOSIS — M10032 Idiopathic gout, left wrist: Secondary | ICD-10-CM

## 2014-10-13 LAB — BASIC METABOLIC PANEL
BUN: 12 mg/dL (ref 6–23)
CHLORIDE: 101 meq/L (ref 96–112)
CO2: 31 mEq/L (ref 19–32)
Calcium: 9.6 mg/dL (ref 8.4–10.5)
Creat: 1.24 mg/dL (ref 0.50–1.35)
Glucose, Bld: 75 mg/dL (ref 70–99)
Potassium: 4.2 mEq/L (ref 3.5–5.3)
Sodium: 138 mEq/L (ref 135–145)

## 2014-10-13 LAB — CBC
HCT: 45.4 % (ref 39.0–52.0)
Hemoglobin: 14.1 g/dL (ref 13.0–17.0)
MCH: 23.9 pg — AB (ref 26.0–34.0)
MCHC: 31.1 g/dL (ref 30.0–36.0)
MCV: 77.1 fL — AB (ref 78.0–100.0)
MPV: 10.8 fL (ref 8.6–12.4)
PLATELETS: 278 10*3/uL (ref 150–400)
RBC: 5.89 MIL/uL — AB (ref 4.22–5.81)
RDW: 14.6 % (ref 11.5–15.5)
WBC: 9.9 10*3/uL (ref 4.0–10.5)

## 2014-10-13 LAB — URIC ACID: Uric Acid, Serum: 8.7 mg/dL — ABNORMAL HIGH (ref 4.0–7.8)

## 2014-10-13 MED ORDER — ALLOPURINOL 100 MG PO TABS: 100.0000 mg | ORAL_TABLET | Freq: Every day | ORAL | Status: DC

## 2014-10-13 MED ORDER — METHYLPREDNISOLONE 4 MG PO TBPK: ORAL_TABLET | ORAL | Status: DC

## 2014-10-13 MED ORDER — TRAMADOL HCL 50 MG PO TABS: 50.0000 mg | ORAL_TABLET | Freq: Three times a day (TID) | ORAL | Status: DC | PRN

## 2014-10-13 NOTE — Assessment & Plan Note (Signed)
Acute gout attack  Start medrol (steroid) dose pak for inflammation Tramadol as needed for pain   Once swelling and pain resolves start allopurinol 100 mg daily with goal of getting uric acid < 6  Checking uric acid today, CBC, BMP F/u in 3- 4 weeks for gout and BP check

## 2014-10-13 NOTE — Patient Instructions (Addendum)
Mr. Kapur,  Thank you for coming in today. It was a pleasure meeting you. I look forward to being your primary doctor.  1. Acute gout attack  Start medrol (steroid) dose pak for inflammation Tramadol as needed for pain   Once swelling and pain resolves start allopurinol 100 mg daily with goal of getting uric acid < 6   2. COPD: Letter for transportation assistance written  Checking uric acid today, CBC, BMP F/u in 3- 4 weeks for gout and BP check  Dr. Adrian Blackwater

## 2014-10-13 NOTE — Progress Notes (Signed)
Patient here for gout flare up over the past 2 weeks with swelling/pain in his left wrist.  Patient reports gout medication causing hives.  Patient last took medication 3 days ago.  Patient reports that flare ups usually last 3-4 days and that it is "unusual for it to last this long."  Pain in left wrist 7/10, described as constant/aching and unable to close hand all the way due to swelling.  Patient aware of avoiding foods that trigger gout flare ups.  Patient states he ate pork neck bones/rice last night.   Patient BP: 164/95, HR 89, O2: 92%.  Patient states BP is always elevated with flare ups and reports using 2L of supplemental O2 at night.  Patient denies shortness of breath at this time.

## 2014-10-13 NOTE — Progress Notes (Signed)
   Subjective:    Patient ID: [name] [name], male    DOB: 1960-10-13, 54 y.o.   MRN: [id number] CC: gout flare, L wrist  HPI  1. Gout flare: 2 weeks of pain and swelling in L wrist and hand. Pain is worse at wrist on radial and volar surface. No fever. No redness. Taking a few ibuprofen. Has not had ETOH in 2 weeks since onset of pain. Reports hives with colchicine.   Soc Hx: former smoke, quit  Review of Systems  Constitutional: Negative for fever, chills and fatigue.  Respiratory: Negative for shortness of breath.   Cardiovascular: Negative for chest pain, palpitations and leg swelling.  Musculoskeletal: Positive for joint swelling and arthralgias. Negative for myalgias, back pain, gait problem and neck pain.  Skin: Negative for rash.       Objective:   Physical Exam BP 164/95 mmHg  Pulse 89  Temp(Src) 98.3 F (36.8 C) (Oral)  Resp 18  Ht 5\' 8"  (1.727 m)  Wt 187 lb 3.2 oz (84.913 kg)  BMI 28.47 kg/m2  SpO2 92% General appearance: alert, cooperative and no distress Extremities: L wrist and hand swelling no erythema, decreased ROM, mild TTP radial and volar wrist  Skin: Skin color, texture, turgor normal. No rashes or lesions Lymph nodes: no L axillary adenopathy      Assessment & Plan:

## 2014-10-14 ENCOUNTER — Telehealth: Payer: Self-pay | Admitting: *Deleted

## 2014-10-14 NOTE — Telephone Encounter (Signed)
Unable to contact pt  Wrong number

## 2014-10-14 NOTE — Telephone Encounter (Signed)
-----   Message from Boykin Nearing, MD sent at 10/14/2014  9:06 AM EDT ----- Elevated uric acid Normal BMP Stable CBC Continue current care plan

## 2014-10-26 ENCOUNTER — Inpatient Hospital Stay (HOSPITAL_COMMUNITY): Payer: MEDICAID

## 2014-10-26 ENCOUNTER — Encounter (HOSPITAL_COMMUNITY): Payer: Self-pay

## 2014-10-26 ENCOUNTER — Other Ambulatory Visit (HOSPITAL_COMMUNITY): Payer: Self-pay

## 2014-10-26 ENCOUNTER — Emergency Department (HOSPITAL_COMMUNITY): Payer: Self-pay

## 2014-10-26 ENCOUNTER — Inpatient Hospital Stay (HOSPITAL_COMMUNITY): Payer: Self-pay

## 2014-10-26 ENCOUNTER — Observation Stay (HOSPITAL_COMMUNITY)
Admission: EM | Admit: 2014-10-26 | Discharge: 2014-10-28 | Disposition: A | Discharge status: Home / Self Care | Payer: Self-pay | Attending: Internal Medicine | Admitting: Internal Medicine

## 2014-10-26 DIAGNOSIS — Z87891 Personal history of nicotine dependence: Secondary | ICD-10-CM | POA: Insufficient documentation

## 2014-10-26 DIAGNOSIS — R079 Chest pain, unspecified: Principal | ICD-10-CM | POA: Diagnosis present

## 2014-10-26 DIAGNOSIS — Z7951 Long term (current) use of inhaled steroids: Secondary | ICD-10-CM | POA: Insufficient documentation

## 2014-10-26 DIAGNOSIS — I272 Pulmonary hypertension, unspecified: Secondary | ICD-10-CM | POA: Diagnosis present

## 2014-10-26 DIAGNOSIS — I10 Essential (primary) hypertension: Secondary | ICD-10-CM | POA: Diagnosis present

## 2014-10-26 DIAGNOSIS — N183 Chronic kidney disease, stage 3 unspecified: Secondary | ICD-10-CM | POA: Diagnosis present

## 2014-10-26 DIAGNOSIS — M25532 Pain in left wrist: Secondary | ICD-10-CM

## 2014-10-26 DIAGNOSIS — M109 Gout, unspecified: Secondary | ICD-10-CM | POA: Diagnosis present

## 2014-10-26 DIAGNOSIS — J449 Chronic obstructive pulmonary disease, unspecified: Secondary | ICD-10-CM | POA: Insufficient documentation

## 2014-10-26 DIAGNOSIS — N189 Chronic kidney disease, unspecified: Secondary | ICD-10-CM

## 2014-10-26 DIAGNOSIS — R9431 Abnormal electrocardiogram [ECG] [EKG]: Secondary | ICD-10-CM | POA: Diagnosis present

## 2014-10-26 DIAGNOSIS — IMO0001 Reserved for inherently not codable concepts without codable children: Secondary | ICD-10-CM | POA: Diagnosis present

## 2014-10-26 DIAGNOSIS — N182 Chronic kidney disease, stage 2 (mild): Secondary | ICD-10-CM | POA: Diagnosis present

## 2014-10-26 DIAGNOSIS — N179 Acute kidney failure, unspecified: Secondary | ICD-10-CM

## 2014-10-26 DIAGNOSIS — Z79899 Other long term (current) drug therapy: Secondary | ICD-10-CM | POA: Insufficient documentation

## 2014-10-26 HISTORY — DX: Essential (primary) hypertension: I10

## 2014-10-26 HISTORY — DX: Pneumonia, unspecified organism: J18.9

## 2014-10-26 LAB — BASIC METABOLIC PANEL
Anion gap: 10 (ref 5–15)
BUN: 17 mg/dL (ref 6–20)
CALCIUM: 9.2 mg/dL (ref 8.9–10.3)
CO2: 26 mmol/L (ref 22–32)
CREATININE: 1.34 mg/dL — AB (ref 0.61–1.24)
Chloride: 99 mmol/L — ABNORMAL LOW (ref 101–111)
GFR calc non Af Amer: 58 mL/min — ABNORMAL LOW (ref >60)
Glucose, Bld: 73 mg/dL (ref 65–99)
Potassium: 4.4 mmol/L (ref 3.5–5.1)
Sodium: 135 mmol/L (ref 135–145)

## 2014-10-26 LAB — PROTIME-INR
INR: 1.06 (ref 0.00–1.49)
Prothrombin Time: 14 seconds (ref 11.6–15.2)

## 2014-10-26 LAB — CBC
HCT: 46.6 % (ref 39.0–52.0)
HEMOGLOBIN: 15.2 g/dL (ref 13.0–17.0)
MCH: 25 pg — ABNORMAL LOW (ref 26.0–34.0)
MCHC: 32.6 g/dL (ref 30.0–36.0)
MCV: 76.6 fL — AB (ref 78.0–100.0)
Platelets: 209 10*3/uL (ref 150–400)
RBC: 6.08 MIL/uL — ABNORMAL HIGH (ref 4.22–5.81)
RDW: 13.6 % (ref 11.5–15.5)
WBC: 13.6 10*3/uL — ABNORMAL HIGH (ref 4.0–10.5)

## 2014-10-26 LAB — TROPONIN I
TROPONIN I: 0.03 ng/mL (ref <0.031)
Troponin I: 0.04 ng/mL — ABNORMAL HIGH (ref <0.031)

## 2014-10-26 LAB — LIPID PANEL
Cholesterol: 156 mg/dL (ref 0–200)
HDL: 38 mg/dL — ABNORMAL LOW (ref >40)
LDL CALC: 108 mg/dL — AB (ref 0–99)
Total CHOL/HDL Ratio: 4.1 RATIO
Triglycerides: 49 mg/dL (ref <150)
VLDL: 10 mg/dL (ref 0–40)

## 2014-10-26 LAB — GLUCOSE, CAPILLARY: Glucose-Capillary: 117 mg/dL — ABNORMAL HIGH (ref 65–99)

## 2014-10-26 LAB — RAPID URINE DRUG SCREEN, HOSP PERFORMED
Amphetamines: NOT DETECTED
Barbiturates: NOT DETECTED
Benzodiazepines: NOT DETECTED
Cocaine: NOT DETECTED
Opiates: POSITIVE — AB
Tetrahydrocannabinol: NOT DETECTED

## 2014-10-26 LAB — DIFFERENTIAL
BAND NEUTROPHILS: 0 % (ref 0–10)
Basophils Absolute: 0.1 10*3/uL (ref 0.0–0.1)
Basophils Relative: 0 % (ref 0–1)
Blasts: 0 %
EOS ABS: 0.3 10*3/uL (ref 0.0–0.7)
Eosinophils Relative: 2 % (ref 0–5)
LYMPHS PCT: 24 % (ref 12–46)
Lymphs Abs: 3.4 10*3/uL (ref 0.7–4.0)
MONO ABS: 2 10*3/uL — AB (ref 0.1–1.0)
MONOS PCT: 15 % — AB (ref 3–12)
Metamyelocytes Relative: 0 %
Myelocytes: 0 %
NEUTROS ABS: 8.1 10*3/uL — AB (ref 1.7–7.7)
NEUTROS PCT: 59 % (ref 43–77)
OTHER: 0 %
PROMYELOCYTES ABS: 0 %
nRBC: 0 /100 WBC

## 2014-10-26 LAB — I-STAT TROPONIN, ED: Troponin i, poc: 0.01 ng/mL (ref 0.00–0.08)

## 2014-10-26 LAB — URIC ACID: Uric Acid, Serum: 8.1 mg/dL — ABNORMAL HIGH (ref 4.4–7.6)

## 2014-10-26 LAB — APTT: APTT: 31 s (ref 24–37)

## 2014-10-26 LAB — CREATININE, URINE, RANDOM: Creatinine, Urine: 64.52 mg/dL

## 2014-10-26 MED ORDER — ASPIRIN 325 MG PO TABS
325.0000 mg | ORAL_TABLET | Freq: Every day | ORAL | Status: DC
  Administered 2014-10-26 – 2014-10-28 (×3): 325 mg via ORAL
  Filled 2014-10-26 (×3): qty 1

## 2014-10-26 MED ORDER — SODIUM CHLORIDE 0.9 % IV SOLN
INTRAVENOUS | Status: DC
  Administered 2014-10-26: 05:00:00 via INTRAVENOUS

## 2014-10-26 MED ORDER — ALUM & MAG HYDROXIDE-SIMETH 200-200-20 MG/5ML PO SUSP: 30.0000 mL | Freq: Four times a day (QID) | ORAL | Status: DC | PRN

## 2014-10-26 MED ORDER — PREDNISONE 20 MG PO TABS: 60.0000 mg | ORAL_TABLET | Freq: Every day | ORAL | Status: DC

## 2014-10-26 MED ORDER — HEPARIN SODIUM (PORCINE) 5000 UNIT/ML IJ SOLN
5000.0000 [IU] | Freq: Three times a day (TID) | INTRAMUSCULAR | Status: DC
  Administered 2014-10-26 – 2014-10-27 (×5): 5000 [IU] via SUBCUTANEOUS
  Filled 2014-10-26 (×7): qty 1

## 2014-10-26 MED ORDER — ALLOPURINOL 300 MG PO TABS
300.0000 mg | ORAL_TABLET | Freq: Every day | ORAL | Status: DC
  Administered 2014-10-26: 300 mg via ORAL
  Filled 2014-10-26: qty 1

## 2014-10-26 MED ORDER — ACETAMINOPHEN 325 MG PO TABS: 650.0000 mg | ORAL_TABLET | Freq: Four times a day (QID) | ORAL | Status: DC | PRN

## 2014-10-26 MED ORDER — MOMETASONE FURO-FORMOTEROL FUM 100-5 MCG/ACT IN AERO
2.0000 | INHALATION_SPRAY | Freq: Two times a day (BID) | RESPIRATORY_TRACT | Status: DC
  Administered 2014-10-26 – 2014-10-28 (×5): 2 via RESPIRATORY_TRACT
  Filled 2014-10-26 (×2): qty 8.8

## 2014-10-26 MED ORDER — NITROGLYCERIN 0.4 MG SL SUBL: 0.4000 mg | SUBLINGUAL_TABLET | SUBLINGUAL | Status: DC | PRN

## 2014-10-26 MED ORDER — METHYLPREDNISOLONE SODIUM SUCC 125 MG IJ SOLR
125.0000 mg | Freq: Once | INTRAMUSCULAR | Status: AC
  Administered 2014-10-26: 125 mg via INTRAVENOUS
  Filled 2014-10-26: qty 2

## 2014-10-26 MED ORDER — PREDNISOLONE 5 MG PO TABS
10.0000 mg | ORAL_TABLET | Freq: Every day | ORAL | Status: DC
  Filled 2014-10-26: qty 2

## 2014-10-26 MED ORDER — IPRATROPIUM-ALBUTEROL 0.5-2.5 (3) MG/3ML IN SOLN
3.0000 mL | Freq: Once | RESPIRATORY_TRACT | Status: AC
  Administered 2014-10-26: 3 mL via RESPIRATORY_TRACT
  Filled 2014-10-26: qty 3

## 2014-10-26 MED ORDER — OXYCODONE-ACETAMINOPHEN 5-325 MG PO TABS
1.0000 | ORAL_TABLET | ORAL | Status: DC | PRN
  Administered 2014-10-26: 2 via ORAL
  Administered 2014-10-26 (×2): 1 via ORAL
  Administered 2014-10-27: 2 via ORAL
  Administered 2014-10-27 (×3): 1 via ORAL
  Administered 2014-10-28: 2 via ORAL
  Filled 2014-10-26 (×2): qty 2
  Filled 2014-10-26 (×3): qty 1
  Filled 2014-10-26: qty 2
  Filled 2014-10-26 (×2): qty 1

## 2014-10-26 MED ORDER — MORPHINE SULFATE 4 MG/ML IJ SOLN
4.0000 mg | Freq: Once | INTRAMUSCULAR | Status: AC
  Administered 2014-10-26: 4 mg via INTRAVENOUS
  Filled 2014-10-26: qty 1

## 2014-10-26 MED ORDER — ASPIRIN 81 MG PO CHEW: 324.0000 mg | CHEWABLE_TABLET | Freq: Once | ORAL | Status: DC

## 2014-10-26 MED ORDER — SODIUM CHLORIDE 0.9 % IJ SOLN
3.0000 mL | Freq: Two times a day (BID) | INTRAMUSCULAR | Status: DC
  Administered 2014-10-26 – 2014-10-27 (×2): 3 mL via INTRAVENOUS

## 2014-10-26 MED ORDER — SODIUM CHLORIDE 0.9 % IV SOLN
INTRAVENOUS | Status: AC
  Administered 2014-10-26: 22:00:00 via INTRAVENOUS

## 2014-10-26 MED ORDER — HYDRALAZINE HCL 20 MG/ML IJ SOLN: 5.0000 mg | INTRAMUSCULAR | Status: DC | PRN

## 2014-10-26 MED ORDER — ONDANSETRON HCL 4 MG/2ML IJ SOLN: 4.0000 mg | Freq: Four times a day (QID) | INTRAMUSCULAR | Status: DC | PRN

## 2014-10-26 MED ORDER — ONDANSETRON HCL 4 MG PO TABS: 4.0000 mg | ORAL_TABLET | Freq: Four times a day (QID) | ORAL | Status: DC | PRN

## 2014-10-26 MED ORDER — AMLODIPINE BESYLATE 5 MG PO TABS
5.0000 mg | ORAL_TABLET | Freq: Every day | ORAL | Status: DC
  Administered 2014-10-26: 5 mg via ORAL
  Filled 2014-10-26: qty 1

## 2014-10-26 MED ORDER — AMLODIPINE BESYLATE 10 MG PO TABS
10.0000 mg | ORAL_TABLET | Freq: Every day | ORAL | Status: DC
  Administered 2014-10-26: 5 mg via ORAL
  Administered 2014-10-27 – 2014-10-28 (×2): 10 mg via ORAL
  Filled 2014-10-26 (×3): qty 1

## 2014-10-26 MED ORDER — ALBUTEROL SULFATE (2.5 MG/3ML) 0.083% IN NEBU: 3.0000 mL | INHALATION_SOLUTION | RESPIRATORY_TRACT | Status: DC | PRN

## 2014-10-26 MED ORDER — ACETAMINOPHEN 650 MG RE SUPP: 650.0000 mg | Freq: Four times a day (QID) | RECTAL | Status: DC | PRN

## 2014-10-26 MED ORDER — MORPHINE SULFATE 2 MG/ML IJ SOLN: 2.0000 mg | INTRAMUSCULAR | Status: DC | PRN

## 2014-10-26 MED ORDER — ATORVASTATIN CALCIUM 40 MG PO TABS
40.0000 mg | ORAL_TABLET | Freq: Every day | ORAL | Status: DC
  Administered 2014-10-26 – 2014-10-27 (×2): 40 mg via ORAL
  Filled 2014-10-26 (×2): qty 1

## 2014-10-26 MED ORDER — HYDRALAZINE HCL 50 MG PO TABS
50.0000 mg | ORAL_TABLET | Freq: Three times a day (TID) | ORAL | Status: DC
  Administered 2014-10-26 – 2014-10-28 (×6): 50 mg via ORAL
  Filled 2014-10-26 (×6): qty 1

## 2014-10-26 MED ORDER — TRAMADOL HCL 50 MG PO TABS: 50.0000 mg | ORAL_TABLET | Freq: Three times a day (TID) | ORAL | Status: DC | PRN

## 2014-10-26 MED ORDER — METHYLPREDNISOLONE SODIUM SUCC 125 MG IJ SOLR
80.0000 mg | Freq: Two times a day (BID) | INTRAMUSCULAR | Status: DC
  Administered 2014-10-26 (×2): 80 mg via INTRAVENOUS
  Filled 2014-10-26 (×2): qty 2

## 2014-10-26 NOTE — Progress Notes (Signed)
Physical Therapy Evaluation Patient Details Name: [name] [name] MRN: [id number] DOB: 29-Apr-1960 Today's Date: 10/26/2014   History of Present Illness  54 yo male with onset of chest pain and chronic lung changes admitted for management of cough and low O2 sats.  Clinical Impression  Pt was able to walk with PT but has decline in O2 sats with effort even on 2L O2.  Has been in bed with no O2 despite orders, and has reported he doesn't need daytime O2.  His willingness to consider follow up therapy and using O2 is limited, and may not be willing to have PT services after hospital.  Informed nursing of his issues to talk with him about compliance with O2.    Follow Up Recommendations SNF    Equipment Recommendations  None recommended by PT    Recommendations for Other Services       Precautions / Restrictions Precautions Precautions: Fall (telemetry) Precaution Comments: L wrist has gouty flareup Required Braces or Orthoses: Sling;Other Brace/Splint (Lwrist brace that doesn't close due to gouty edema) Restrictions Weight Bearing Restrictions: No Other Position/Activity Restrictions: Wearing sling to protect L wrist in brace      Mobility  Bed Mobility Overal bed mobility: Needs Assistance Bed Mobility: Supine to Sit;Sit to Supine     Supine to sit: Min assist Sit to supine: Min assist   General bed mobility comments: Min assist to get off bed due to L arm pain  Transfers Overall transfer level: Modified independent Equipment used: None             General transfer comment: Uses R arm to push up to stand  Ambulation/Gait Ambulation/Gait assistance: Min guard (IV pole) Ambulation Distance (Feet): 100 Feet Assistive device: 1 person hand held assist (IV pole) Gait Pattern/deviations: Step-through pattern;Decreased stride length;Narrow base of support;Trunk flexed Gait velocity: reduced Gait velocity interpretation: Below normal speed for age/gender    Stairs             Wheelchair Mobility    Modified Rankin (Stroke Patients Only)       Balance Overall balance assessment: Needs assistance Sitting-balance support: Feet supported Sitting balance-Leahy Scale: Good     Standing balance support: Single extremity supported Standing balance-Leahy Scale: Fair Standing balance comment: fair- dynamic balance with IV pole steadying                             Pertinent Vitals/Pain Pain Assessment: 0-10 Pain Score: 10-Worst pain ever Pain Location: R knee and L wrist Pain Intervention(s): Limited activity within patient's tolerance;Monitored during session;Repositioned;Premedicated before session;Other (comment) (Pt in sling to protect L wrist)    Home Living Family/patient expects to be discharged to:: Private residence Living Arrangements: Children Available Help at Discharge: Family;Available 24 hours/day Type of Home: House Home Access: Level entry     Home Layout: One level Home Equipment: Cane - single point;Crutches Additional Comments: has minimal adaptive equipment but on O2 2L at night only    Prior Function Level of Independence: Independent with assistive device(s)               Hand Dominance        Extremity/Trunk Assessment   Upper Extremity Assessment: LUE deficits/detail       LUE Deficits / Details: Lwrist edema and significant pain with limited to no use of LUE   Lower Extremity Assessment: Generalized weakness      Cervical / Trunk Assessment: Normal  Communication   Communication: No difficulties  Cognition Arousal/Alertness: Awake/alert Behavior During Therapy: Anxious;Impulsive Overall Cognitive Status: No family/caregiver present to determine baseline cognitive functioning       Memory: Decreased recall of precautions              General Comments General comments (skin integrity, edema, etc.): Pt is having some difficulty with controlling vitals, was in bed with no  O2 and sat at rest was 87%.  His sat recovered to 98% on 2L air, then after walk was 85%.  His nurse was informed that sat could not be maintained on 2L but did desat before reapplying.  He is also removing L arm sling continually and will need encouragement to keep on the sling and O2 for his benefit.     Exercises        Assessment/Plan    PT Assessment Patient needs continued PT services  PT Diagnosis Generalized weakness   PT Problem List Decreased strength;Decreased range of motion;Decreased activity tolerance;Decreased balance;Decreased mobility;Decreased coordination;Decreased knowledge of use of DME;Decreased safety awareness;Cardiopulmonary status limiting activity;Pain;Decreased skin integrity  PT Treatment Interventions DME instruction;Gait training;Functional mobility training;Therapeutic activities;Therapeutic exercise;Balance training;Neuromuscular re-education;Patient/family education   PT Goals (Current goals can be found in the Care Plan section) Acute Rehab PT Goals Patient Stated Goal: to go straight  home PT Goal Formulation: With patient Time For Goal Achievement: 11/09/14 Potential to Achieve Goals: Good    Frequency Min 2X/week   Barriers to discharge Other (comment) (needs assist to monitor vitals and for gait)      Co-evaluation               End of Session Equipment Utilized During Treatment: Oxygen Activity Tolerance: Patient limited by pain;Patient limited by fatigue;Patient limited by lethargy;Other (comment) (SOB with O2 and walk ) Patient left: in bed;with call bell/phone within reach;with bed alarm set Nurse Communication: Mobility status;Precautions         Time: 0315-9458 PT Time Calculation (min) (ACUTE ONLY): 34 min   Charges:   PT Evaluation $Initial PT Evaluation Tier I: 1 Procedure PT Treatments $Gait Training: 8-22 mins   PT G Codes:        [name] [name] Nov 17, 2014, 12:41 PM  [name] [name], PT MS Acute Rehab Dept. Number:  [facility] [id number] and [place] [phone]

## 2014-10-26 NOTE — ED Notes (Signed)
Pt returned from xray

## 2014-10-26 NOTE — ED Notes (Signed)
Per [facility], pt from home for cp that started today about 1500. No radiation of pain. SOB started upon arrival to the ED. States last time he felt this way he had PNA. Also c/o pain to left arm and right knee with swelling to both. Has left arm in sling by EMS. 18g to RFA. 324 mg ASA given.

## 2014-10-26 NOTE — ED Notes (Signed)
When pt sleeping, oxygen saturation drops to 88-89% on 3 liters. O2 bumped up to 4 liters at this time.

## 2014-10-26 NOTE — Consult Note (Addendum)
Admit date: 10/26/2014 Referring Physician  Dr. Candiss Norse Primary Physician  Dr. Adrian Blackwater Primary Cardiologist  None Reason for Consultation  Chest pain  HPI: [name] [name] is a 54 y.o. male with PMH of hypertension, gout, COPD on 2 L oxygen at home, cocaine abuse, CKD-II, who presents with chest pain and pain over left wrist and right knee.  Patient reports that he started having chest pain at about 3 PM yesterday. It was located in the substernal area, constant, 4 out of 10 in severity, nonradiating. It was not aggravated or alleviated by any known factors. It was associated with shortness of breath. He states that he has had a mild cough due to COPD, which is at his baseline. His chest pain resolved spontaneously at about 10:00 PM last night. Currently no chest pain and shortness of breath.  He also complains of pain and swelling in the left wrist and right knee for 1 month that is similar to previous episodes of gout. He has been seen by his PCP. He was treated with steroid for 5 days a week before, with good relief of pain, but Had not completely subsided.  No fever or chills. Patient does not have abdominal pain, diarrhea, symptoms of a UTI, unilateral weakness.  In ED, patient was found to have WBC 13.6, normal temperature, slightly worsening renal function, negative troponin, acute chest x-ray. Patient is admitted to inpatient for further evaluation and treatment.  He has only had 1 troponin drawn thus far.  Cardiology is now asked to consult due to CP and T wave  Inversions in the lateral leads which are new.      PMH:   Past Medical History  Diagnosis Date  . Gout   . Thrombocytopenia 07/28/2012  . Tobacco abuse 07/29/2012  . Influenza with respiratory manifestations 07/30/2012  . Asthma 1999  . 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 COPD (chronic obstructive pulmonary disease) 12/15/2013  . Essential hypertension   . Pneumonia      PSH:   Past Surgical History    Procedure Laterality Date  . Knee surgery      right knee s/p trauma  . Fracture surgery  childhood    right arm    Allergies:  Shellfish allergy and Colchicine Prior to Admit Meds:   Prescriptions prior to admission  Medication Sig Dispense Refill Last Dose  . albuterol (PROVENTIL HFA;VENTOLIN HFA) 108 (90 BASE) MCG/ACT inhaler Inhale 2 puffs into the lungs every 4 (four) hours as needed for wheezing or shortness of breath. 18 g 11 10/25/2014 at Unknown time  . Fluticasone-Salmeterol (ADVAIR) 100-50 MCG/DOSE AEPB Inhale 1 puff into the lungs 2 (two) times daily. 180 each 11 10/24/2014 at Unknown time  . losartan (COZAAR) 50 MG tablet Take 1 tablet (50 mg total) by mouth daily. 90 tablet 3 10/25/2014 at Unknown time  . traMADol (ULTRAM) 50 MG tablet Take 1 tablet (50 mg total) by mouth every 8 (eight) hours as needed. 30 tablet 0 10/25/2014 at Unknown time  . allopurinol (ZYLOPRIM) 100 MG tablet Take 1 tablet (100 mg total) by mouth daily. 30 tablet 6   . methylPREDNISolone (MEDROL DOSEPAK) 4 MG TBPK tablet Per packet insert (Patient not taking: Reported on 10/26/2014) 21 tablet 0 Completed Course at Unknown time   Fam HX:    Family History  Problem Relation Age of Onset  . Colon cancer      mother ? age of diagnosis   . Cancer Mother  colon   . Heart disease Father    Social HX:    History   Social History  . Marital Status: Legally Separated    Spouse Name: N/A  . Number of Children: 5   . Years of Education: 11   Occupational History  . Unemployed     Social History Main Topics  . Smoking status: Former Smoker -- 1.00 packs/day for 20 years    Quit date: 12/15/2013  . Smokeless tobacco: Never Used     Comment: 'Quitting"  . Alcohol Use: Yes     Comment: occaisional beer  . Drug Use: No     Comment: 10/13/14: last use of cocaine one month ago   . Sexual Activity: Yes    Birth Control/ Protection: Condom   Other Topics Concern  . Not on file   Social History  Narrative   Lives with daughter and grandkids x 2. 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    Smokes 1ppd since age 89 y.o    Drinks 2 times per week 40 oz beer. Denies liquor, wine. History of cocaine use. Denies IVDU   Has an orange card    Sexually active with 1 partner uses protection                  ROS:  All 11 ROS were addressed and are negative except what is stated in the HPI  Physical Exam: Blood pressure 151/90, pulse 84, temperature 98.3 F (36.8 C), temperature source Oral, resp. rate 19, height 5\' 8"  (1.727 m), weight 179 lb 7.3 oz (81.4 kg), SpO2 89 %.    General: Well developed, well nourished, in no acute distress Head: Eyes PERRLA, No xanthomas.   Normal cephalic and atramatic  Lungs:   Clear bilaterally to auscultation and percussion. Heart:   HRRR S1 S2 Pulses are 2+ & equal.            No carotid bruit. No JVD.  No abdominal bruits. No femoral bruits. Abdomen: Bowel sounds are positive, abdomen soft and non-tender without masses Extremities:   No clubbing, cyanosis or edema.  DP +1 Neuro: Alert and oriented X 3. Psych:  Good affect, responds appropriately    Labs:   Lab Results  Component Value Date   WBC 13.6* 10/26/2014   HGB 15.2 10/26/2014   HCT 46.6 10/26/2014   MCV 76.6* 10/26/2014   PLT 209 10/26/2014    Recent Labs Lab 10/26/14 0110  NA 135  K 4.4  CL 99*  CO2 26  BUN 17  CREATININE 1.34*  CALCIUM 9.2  GLUCOSE 73   No results found for: PTT Lab Results  Component Value Date   INR 1.06 10/26/2014   Lab Results  Component Value Date   TROPONINI 0.03 10/26/2014     Lab Results  Component Value Date   CHOL 156 10/26/2014   Lab Results  Component Value Date   HDL 38* 10/26/2014   Lab Results  Component Value Date   LDLCALC 108* 10/26/2014   Lab Results  Component Value Date   TRIG 49 10/26/2014   Lab Results  Component Value Date   CHOLHDL 4.1  10/26/2014   No results found for: LDLDIRECT    Radiology:  Dg Chest 2 View  10/26/2014   CLINICAL DATA:  54 year old male with chest pain  EXAM: CHEST  2 VIEW  COMPARISON:  Chest  CT dated 12/13/2013  FINDINGS: Two views of the chest demonstrate emphysematous changes of the lungs. No focal consolidation, pleural effusion, or pneumothorax. Bibasilar subsegmental linear atelectasis/ scarring noted. The cardiomediastinal silhouette is within normal limits. The osseous structures are grossly unremarkable.  IMPRESSION: Emphysema.  No focal consolidation or pneumothorax.   Electronically Signed   By: Anner Crete M.D.   On: 10/26/2014 02:00    EKG:  NSR with LVH and lateral T wave changes  ASSESSMENT/PLAN: 1.  Chest pain - symptoms are concerning for angina in that quality and associated with SOB.  His drug screen was negative for cocaine, although he does have a history of abuse in the past.  His EKG shows new T wave inversions in the lateral precordial leads with LVH.  This could be repolarization changes from LVH.  His prior EKGs showed T wave inversions in V1-V3 which are not present this admission.  He has a family history of heart disease and is a former smoker.  Chest xray is clear.  FLP and HbA1C are pending.  Will cycle enzymes and get a 2D echo.  If enzymes negative and LVF normal on echo then will get nuclear stress test tomorrow.  Will make NPO after MN. 2.  Gout in left wrist and right knee - per hospitalist 3.  Acute on CKD felt secondary to prerenal state from dehydration.  ARB on hold.  IVF. 4.  HTN - amlodipine started as ARB has been stopped due to #3 5.  COPD 6.  Severe pulmonary HTN on echo 12/2013 most likely secondary to COPD - repeat echo 7.  History of NSVT 12/2013 hospitalization for COPD exacerbation.  No further w/u done  Sueanne Margarita, MD  10/26/2014  8:43 AM

## 2014-10-26 NOTE — Progress Notes (Signed)
Patient Demographics:    [name] [name], is a 54 y.o. male, DOB - 02-03-1961, VZD:[id number]  Admit date - 10/26/2014   Admitting Physician Ivor Costa, MD  Outpatient Primary MD for the patient is Minerva Ends, MD  LOS - 0   Chief Complaint  Patient presents with  . Chest Pain  . Arm Pain  . Knee Pain        Subjective:    [name] today has, No headache, No chest pain, No abdominal pain - No Nausea, No new weakness tingling or numbness, No Cough - SOB. Is having some pain in his left wrist mostly, minimal in the right knee   Assessment  & Plan :     1. Atypical chest pain. Did have some T-wave inversion in lateral leads. Chest pain resolved, troponin negative, seen by cardiology, currently pain and symptom free, continue on aspirin, echocardiogram and Lexiscan ordered by cardiology.  2. Subacute gout in left wrist and right knee. Pain ongoing for the last few weeks, initially got some steroids by PCP and improved, flared up again after steroids were stopped. Uric acid level is elevated, he is allergic to culture seen, x-ray of the left wrist unremarkable, will place on IV steroids. Monitor.   3. CK D stage III. Baseline creatinine is 1.3. Will avoid NSAIDs, ACE/ARB.   4. Essential hypertension. Placed on Norvasc and hydralazine continue to monitor.   5. COPD. At baseline. Continue supportive care with home nebulizer treatments and oxygen if needed.   6.Chewing Tobacco - Occasional Alcohol, remote history of cocaine abuse. Counseled to quit all.    Code Status : Full  Family Communication  : None present  Disposition Plan  : Home 1-2 days  Consults  :  Cards  Procedures  :   TTE  Lexiscan  DVT Prophylaxis  :    Heparin   Lab Results  Component Value Date   PLT 209  10/26/2014    Inpatient Medications  Scheduled Meds: . allopurinol  300 mg Oral Daily  . amLODipine  10 mg Oral Daily  . aspirin  324 mg Oral Once  . aspirin  325 mg Oral Daily  . atorvastatin  40 mg Oral q1800  . heparin  5,000 Units Subcutaneous 3 times per day  . hydrALAZINE  50 mg Oral 3 times per day  . methylPREDNISolone (SOLU-MEDROL) injection  80 mg Intravenous Q12H  . mometasone-formoterol  2 puff Inhalation BID  . sodium chloride  3 mL Intravenous Q12H   Continuous Infusions: . sodium chloride     PRN Meds:.acetaminophen **OR** acetaminophen, albuterol, alum & mag hydroxide-simeth, hydrALAZINE, morphine injection, nitroGLYCERIN, ondansetron **OR** ondansetron (ZOFRAN) IV, oxyCODONE-acetaminophen, traMADol  Antibiotics  :     Anti-infectives    None        Objective:   Filed Vitals:   10/26/14 0315 10/26/14 0330 10/26/14 0356 10/26/14 0819  BP: 127/69 125/83 151/90 144/98  Pulse: 77 80 84   Temp:   98.3 F (36.8 C)   TempSrc:   Oral   Resp: 19 19    Height:   5\' 8"  (1.727 m)   Weight:   81.4 kg (179 lb 7.3 oz)   SpO2: 90% 89% 89%  Wt Readings from Last 3 Encounters:  10/26/14 81.4 kg (179 lb 7.3 oz)  10/13/14 84.913 kg (187 lb 3.2 oz)  08/20/14 82.555 kg (182 lb)    No intake or output data in the 24 hours ending 10/26/14 1125   Physical Exam  Awake Alert, Oriented X 3, No new F.N deficits, Normal affect [place].AT,PERRAL Supple Neck,No JVD, No cervical lymphadenopathy appriciated.  Symmetrical Chest wall movement, Good air movement bilaterally, CTAB RRR,No Gallops,Rubs or new Murmurs, No Parasternal Heave +ve B.Sounds, Abd Soft, No tenderness, No organomegaly appriciated, No rebound - guarding or rigidity. No Cyanosis, Clubbing or edema, No new Rash or bruise. Left wrist and right knee are tender, minimal swelling, no warmth or redness.       Data Review:   Micro Results Recent Results (from the past 240 hour(s))  Culture, blood (routine x  2)     Status: None (Preliminary result)   Collection Time: 10/26/14  4:29 AM  Result Value Ref Range Status   Specimen Description BLOOD RIGHT ANTECUBITAL  Final   Special Requests BOTTLES DRAWN AEROBIC AND ANAEROBIC 6CC EA  Final   Culture PENDING  Incomplete   Report Status PENDING  Incomplete    Radiology Reports Dg Chest 2 View  10/26/2014   CLINICAL DATA:  53 year old male with chest pain  EXAM: CHEST  2 VIEW  COMPARISON:  Chest CT dated 12/13/2013  FINDINGS: Two views of the chest demonstrate emphysematous changes of the lungs. No focal consolidation, pleural effusion, or pneumothorax. Bibasilar subsegmental linear atelectasis/ scarring noted. The cardiomediastinal silhouette is within normal limits. The osseous structures are grossly unremarkable.  IMPRESSION: Emphysema.  No focal consolidation or pneumothorax.   Electronically Signed   By: Anner Crete M.D.   On: 10/26/2014 02:00   Dg Wrist Complete Left  10/26/2014   CLINICAL DATA:  Left wrist swelling and pain for 1 month, history of gout  EXAM: LEFT WRIST - COMPLETE 3+ VIEW  COMPARISON:  06/12/2009 thumb radiographs  FINDINGS: There is no evidence of fracture or dislocation. There is no evidence of arthropathy or other focal bone abnormality. Soft tissues are unremarkable. Minimal spurring at the ulnar styloid process.  IMPRESSION: Negative.   Electronically Signed   By: Conchita Paris M.D.   On: 10/26/2014 08:48     CBC  Recent Labs Lab 10/26/14 0110  WBC 13.6*  HGB 15.2  HCT 46.6  PLT 209  MCV 76.6*  MCH 25.0*  MCHC 32.6  RDW 13.6  LYMPHSABS 3.4  MONOABS 2.0*  EOSABS 0.3  BASOSABS 0.1    Chemistries   Recent Labs Lab 10/26/14 0110  NA 135  K 4.4  CL 99*  CO2 26  GLUCOSE 73  BUN 17  CREATININE 1.34*  CALCIUM 9.2   ------------------------------------------------------------------------------------------------------------------ estimated creatinine clearance is 60.3 mL/min (by C-G formula based on  Cr of 1.34). ------------------------------------------------------------------------------------------------------------------ No results for input(s): HGBA1C in the last 72 hours. ------------------------------------------------------------------------------------------------------------------  Recent Labs  10/26/14 0429  CHOL 156  HDL 38*  LDLCALC 108*  TRIG 49  CHOLHDL 4.1   ------------------------------------------------------------------------------------------------------------------ No results for input(s): TSH, T4TOTAL, T3FREE, THYROIDAB in the last 72 hours.  Invalid input(s): FREET3 ------------------------------------------------------------------------------------------------------------------ No results for input(s): VITAMINB12, FOLATE, FERRITIN, TIBC, IRON, RETICCTPCT in the last 72 hours.  Coagulation profile  Recent Labs Lab 10/26/14 0429  INR 1.06    No results for input(s): DDIMER in the last 72 hours.  Cardiac Enzymes  Recent Labs Lab 10/26/14 0429 10/26/14 0959  TROPONINI  0.03 <0.03   ------------------------------------------------------------------------------------------------------------------ Invalid input(s): POCBNP   Time Spent in minutes  35   Avira Tillison K M.D on 10/26/2014 at 11:25 AM  Between 7am to 7pm - Pager - [phone]  After 7pm go to [email or web address] - password [facility]  [facility] -  Office  [phone]

## 2014-10-26 NOTE — Progress Notes (Signed)
Orthopedic Tech Progress Note Patient Details:  [name] [name] 1961/01/16 [id number]  Ortho Devices Type of Ortho Device: Velcro wrist splint Ortho Device/Splint Location: lue Ortho Device/Splint Interventions: Application   [name] 10/26/2014, 8:15 AM

## 2014-10-26 NOTE — ED Notes (Signed)
Dr Niu at the bedside. 

## 2014-10-26 NOTE — H&P (Addendum)
[facility] History and Physical  [name] [name] JJK:[id number] DOB: 28-Jan-1961 DOA: 10/26/2014  Referring physician: ED physician PCP: [name] Ends, MD  Specialists:   Chief Complaint: Chest pain and pain over left wrist and right knee  HPI: [name] [name] is a 54 y.o. male with PMH of hypertension, gout, COPD on 2 L oxygen at home, cocaine abuse, CKD-II, who presents with chest pain and pain over left wrist and right knee.  Patient reports that he started having chest pain at about 3 PM. It is located in the substernal area, constant, 4 out of 10 in severity, nonradiating. It is not aggravated or alleviated by any known factors. It is associated with shortness of breath. He states that he has mild cough due to COPD, which is at his baseline.  His chest pain has been intermittently accentuated, then resolved spontaneously at about 10:00 PM. Currently no chest pain and shortness of breath.  He also complains of pain and swelling in the left wrist and right knee for 1 month that is similar to previous episodes of gout. He has been seen by his PCP. He was treated with steroid for 5 days a week before, with good relief of pain, but  Had not completely subsided. His pain is getting worse in the past several days. His left wrist swelling is also getting worse. No fever or chills. Patient does not have abdominal pain, diarrhea, symptoms of a UTI, unilateral weakness.  In ED, patient was found to have WBC 13.6, normal temperature, slightly worsening renal function, negative troponin, acute chest x-ray. Patient is admitted to inpatient for further evaluation and treatment.  Where does patient live?   At home  Can patient participate in ADLs?  Yes   Review of Systems:   General: no fevers, chills, no changes in body weight, has fatigue HEENT: no blurry vision, hearing changes or sore throat Pulm: had dyspnea, coughing, no wheezing CV: had chest pain, no palpitations Abd: no nausea, vomiting,  abdominal pain, diarrhea, constipation GU: no dysuria, burning on urination, increased urinary frequency, hematuria  Ext: no leg edema. Has pain over left wrist and right knee. Neuro: no unilateral weakness, numbness, or tingling, no vision change or hearing loss Skin: no rash MSK: No muscle spasm, no deformity, no limitation of range of movement in spin Heme: No easy bruising.  Travel history: No recent long distant travel.  Allergy:  Allergies  Allergen Reactions  . Shellfish Allergy Anaphylaxis  . Colchicine Hives    Past Medical History  Diagnosis Date  . Gout   . Thrombocytopenia 07/28/2012  . Tobacco abuse 07/29/2012  . Influenza with respiratory manifestations 07/30/2012  . Asthma 1999  . 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 COPD (chronic obstructive pulmonary disease) 12/15/2013  . Essential hypertension   . Pneumonia     Past Surgical History  Procedure Laterality Date  . Knee surgery      right knee s/p trauma  . Fracture surgery  childhood    right arm    Social History:  reports that he quit smoking about 10 months ago. He has never used smokeless tobacco. He reports that he drinks alcohol. He reports that he does not use illicit drugs.  Family History:  Family History  Problem Relation Age of Onset  . Colon cancer      mother ? age of diagnosis   . Cancer Mother     colon   . Heart disease Father  Prior to Admission medications   Medication Sig Start Date End Date Taking? Authorizing Provider  albuterol (PROVENTIL HFA;VENTOLIN HFA) 108 (90 BASE) MCG/ACT inhaler Inhale 2 puffs into the lungs every 4 (four) hours as needed for wheezing or shortness of breath. 08/20/14  Yes Josalyn Funches, MD  Fluticasone-Salmeterol (ADVAIR) 100-50 MCG/DOSE AEPB Inhale 1 puff into the lungs 2 (two) times daily. 08/20/14  Yes Josalyn Funches, MD  losartan (COZAAR) 50 MG tablet Take 1 tablet (50 mg total) by mouth daily. 02/17/14  Yes Josalyn Funches, MD   traMADol (ULTRAM) 50 MG tablet Take 1 tablet (50 mg total) by mouth every 8 (eight) hours as needed. 10/13/14  Yes Josalyn Funches, MD  allopurinol (ZYLOPRIM) 100 MG tablet Take 1 tablet (100 mg total) by mouth daily. 10/13/14   Boykin Nearing, MD  methylPREDNISolone (MEDROL DOSEPAK) 4 MG TBPK tablet Per packet insert Patient not taking: Reported on 10/26/2014 10/13/14   Boykin Nearing, MD    Physical Exam: Filed Vitals:   10/26/14 0300 10/26/14 0315 10/26/14 0330 10/26/14 0356  BP: 121/81 127/69 125/83 151/90  Pulse: 78 77 80 84  Temp:    98.3 F (36.8 C)  TempSrc:    Oral  Resp: 24 19 19    Height:    5\' 8"  (1.727 m)  Weight:    81.4 kg (179 lb 7.3 oz)  SpO2: 91% 90% 89% 89%   General: Not in acute distress HEENT:       Eyes: PERRL, EOMI, no scleral icterus.       ENT: No discharge from the ears and nose, no pharynx injection, no tonsillar enlargement.        Neck: No JVD, no bruit, no mass felt. Heme: No neck lymph node enlargement. Cardiac: S1/S2, RRR, No murmurs, No gallops or rubs. Pulm: No rales, wheezing, rhonchi or rubs. Abd: Soft, nondistended, nontender, no rebound pain, no organomegaly, BS present. Ext: No pitting leg edema bilaterally. 2+DP/PT pulse bilaterally. There is swelling and tenderness over left wrist and right knee. Musculoskeletal: No joint deformities, No joint redness or warmth, no limitation of ROM in spin. Skin: No rashes.  Neuro: Alert, oriented X3, cranial nerves II-XII grossly intact, muscle strength 5/5 in all extremities, sensation to light touch intact.  Psych: Patient is not psychotic, no suicidal or hemocidal ideation.  Labs on Admission:  Basic Metabolic Panel:  Recent Labs Lab 10/26/14 0110  NA 135  K 4.4  CL 99*  CO2 26  GLUCOSE 73  BUN 17  CREATININE 1.34*  CALCIUM 9.2   Liver Function Tests: No results for input(s): AST, ALT, ALKPHOS, BILITOT, PROT, ALBUMIN in the last 168 hours. No results for input(s): LIPASE, AMYLASE in the  last 168 hours. No results for input(s): AMMONIA in the last 168 hours. CBC:  Recent Labs Lab 10/26/14 0110  WBC 13.6*  HGB 15.2  HCT 46.6  MCV 76.6*  PLT 209   Cardiac Enzymes: No results for input(s): CKTOTAL, CKMB, CKMBINDEX, TROPONINI in the last 168 hours.  BNP (last 3 results) No results for input(s): BNP in the last 8760 hours.  ProBNP (last 3 results)  Recent Labs  12/11/13 0130  PROBNP 2989.0*    CBG: No results for input(s): GLUCAP in the last 168 hours.  Radiological Exams on Admission: Dg Chest 2 View  10/26/2014   CLINICAL DATA:  55 year old male with chest pain  EXAM: CHEST  2 VIEW  COMPARISON:  Chest CT dated 12/13/2013  FINDINGS: Two views of the chest  demonstrate emphysematous changes of the lungs. No focal consolidation, pleural effusion, or pneumothorax. Bibasilar subsegmental linear atelectasis/ scarring noted. The cardiomediastinal silhouette is within normal limits. The osseous structures are grossly unremarkable.  IMPRESSION: Emphysema.  No focal consolidation or pneumothorax.   Electronically Signed   By: Anner Crete M.D.   On: 10/26/2014 02:00    EKG: Independently reviewed.  Abnormal findings:  T-wave inversion in V4 to V6, II-III, which are new (patient had T-wave inversion in V1-V3 in previous EKG on 12/15/13).  Assessment/Plan Principal Problem:   Chest pain Active Problems:   Acute on chronic kidney failure   COPD bronchitis   Gout   Hypertension   Moderate to severe pulmonary hypertension   Essential hypertension   Pain in the chest  Chest pain: No pneumonia on chest x-ray. Less likely to have PE since his chest pain has resolved completely. Will r/o ACS. Patient had T-wave inversion. Inititial roponin negative. No previous cardiac cath on record.  - will admit to Tele bed  - cycle CE q6 x3 and repeat her EKG in the am  - Nitroglycerin, Morphine, and aspirin, lipitor  - Risk factor stratification: will check FLP and A1C  -  Consider cardiology consult if test positive for CEs  - 2d echo - Mylanta for possible acid reflux  Gout in left wrist and right knee: Patient reports that this is typical for his artifact. He has leukocytosis, but no fever. No psychosis is likely due to pain and stress-induced demargination or recent sterile use. Given he responded to steroid treatment, less likely to have septic joint.  -ED gave 1 dose of Solu-Medrol 125 mg 1 -Prednisone 10 mg daily -Cannot start indomethacin given worsening renal function -Patient is allergic to Colchicine -check Uric acid level  AoCKD-II: Mildly worsening renal function. Baseline Cre is 1.25 , his Cre is 1.34 on admission. Likely due to prerenal secondary to dehydration and continuation of ARB. - IVF as above: NS 75cc/h - Check FeNa  - Follow up renal function by BMP -hold cozarr  HTN:  -hold Cozarr due to AoCKD -start amlodipine 5 mg daily -IV hydralazine when necessary -prn tramadol  COPD: stable.  -When necessary Albuterol, Dulera   DVT ppx: SQ Heparin     Code Status: Full code Family Communication: None at bed side.  Disposition Plan: Admit to inpatient   Date of Service 10/26/2014    Ivor Costa [facility] Pager [phone]  If 7PM-7AM, please contact night-coverage [email or web address] Password [email or web address] 10/26/2014, 4:40 AM

## 2014-10-26 NOTE — ED Provider Notes (Signed)
This chart was scribed for  [place], DO by Altamease Oiler, ED Scribe. This patient was seen in room D36C/D36C and the patient's care was started at 1:22 AM.  TIME SEEN: 1:22 AM  CHIEF COMPLAINT: Chest pain  HPI: [name] [name] is a 54 y.o. male with PMHx of COPD, continued tobacco use, and gout who presents to the Emergency Department complaining of intermittent chest pain with onset yesterday afternoon. Pt describes the diffuse pain as tightness and rates it 4/10 in severity. No pain radiation. No modifying factors. Pt is pain free at present. Associated symptoms include SOB, diaphoresis. Pain has been intermittent. Denies history of previous stress test or cardiac catheterization.    Pt is on 2 L [place] at home.  He also complains of pain and swelling in the left wrist and right knee for 1 month that is similar to previous episodes of gout. He has been seen by his PCP and notes being prescribed steroids with some transient relief. No fever, nausea, cough, or dizziness.   ROS: See HPI Constitutional: no fever  Eyes: no drainage  ENT: no runny nose   Cardiovascular: chest pain  Resp: SOB  GI: no vomiting GU: no dysuria Integumentary: no rash  Allergy: no hives  Musculoskeletal: no leg swelling  Neurological: no slurred speech ROS otherwise negative  PAST MEDICAL HISTORY/PAST SURGICAL HISTORY:  Past Medical History  Diagnosis Date  . Gout   . Thrombocytopenia 07/28/2012  . Tobacco abuse 07/29/2012  . Influenza with respiratory manifestations 07/30/2012  . Asthma 1999  . 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 COPD (chronic obstructive pulmonary disease) 12/15/2013    MEDICATIONS:  Prior to Admission medications   Medication Sig Start Date End Date Taking? Authorizing Provider  albuterol (PROVENTIL HFA;VENTOLIN HFA) 108 (90 BASE) MCG/ACT inhaler Inhale 2 puffs into the lungs every 4 (four) hours as needed for wheezing or shortness of breath. 08/20/14   Josalyn Funches,  MD  allopurinol (ZYLOPRIM) 100 MG tablet Take 1 tablet (100 mg total) by mouth daily. 10/13/14   Josalyn Funches, MD  Fluticasone-Salmeterol (ADVAIR) 100-50 MCG/DOSE AEPB Inhale 1 puff into the lungs 2 (two) times daily. 08/20/14   Josalyn Funches, MD  losartan (COZAAR) 50 MG tablet Take 1 tablet (50 mg total) by mouth daily. 02/17/14   Josalyn Funches, MD  methylPREDNISolone (MEDROL DOSEPAK) 4 MG TBPK tablet Per packet insert 10/13/14   Josalyn Funches, MD  traMADol (ULTRAM) 50 MG tablet Take 1 tablet (50 mg total) by mouth every 8 (eight) hours as needed. 10/13/14   Boykin Nearing, MD    ALLERGIES:  Allergies  Allergen Reactions  . Shellfish Allergy Anaphylaxis  . Colchicine Hives    SOCIAL HISTORY:  History  Substance Use Topics  . Smoking status: Former Smoker -- 1.00 packs/day for 20 years    Quit date: 12/15/2013  . Smokeless tobacco: Never Used     Comment: 'Quitting"  . Alcohol Use: Yes     Comment: occaisional beer    FAMILY HISTORY: Family History  Problem Relation Age of Onset  . Colon cancer      mother ? age of diagnosis   . Cancer Mother     colon   . Heart disease Father     EXAM: BP 164/99 mmHg  Pulse 110  Temp(Src) 98.9 F (37.2 C) (Oral)  Resp 26  Ht 5\' 8"  (1.727 m)  Wt 191 lb (86.637 kg)  BMI 29.05 kg/m2  SpO2 88% CONSTITUTIONAL: Alert  and oriented and responds appropriately to questions. Well-appearing; well-nourished HEAD: Normocephalic EYES: Conjunctivae clear, PERRL ENT: normal nose; no rhinorrhea; moist mucous membranes; pharynx without lesions noted NECK: Supple, no meningismus, no LAD  CARD: RRR; S1 and S2 appreciated; no murmurs, no clicks, no rubs, no gallops RESP: Minimally tachypneic, lungs clear bilaterally but diminished at the bases, no wheezes, rhonchi, or rales, O2 sat in low 90s on normal 2 L, no respiratory distress, speaking in full sentences  ABD/GI: Normal bowel sounds; non-distended; soft, non-tender, no rebound, no  guarding BACK:  The back appears normal and is non-tender to palpation, there is no CVA tenderness EXT: Patient has a swollen left wrist that is slightly warm without erythema, patient has full range of motion of his wrist with some pain, 2+ pulse bilaterally, compartments are soft, Normal ROM in all joints; otherwise x-rays are non-tender to palpation; no edema; normal capillary refill; no cyanosis; 2+ radial pulses bilaterally    SKIN: Normal color for age and race; warm NEURO: Moves all extremities equally PSYCH: The patient's mood and manner are appropriate. Grooming and personal hygiene are appropriate.  MEDICAL DECISION MAKING: Patient here with chest pain, shortness of breath and diaphoresis that has been intermittent. He has T-wave inversions in anterolateral leads is new compared to prior. Troponin is negative. Chest x-ray is clear. He was diminished at his bases and mildly tachypneic upon arrival. Given DuoNeb and Solu-Medrol for possible mild COPD exacerbation.  Patient appears to have gout in his left wrist. No signs of septic arthritis.  ED PROGRESS:   3:00 AM-Consult complete with Dr. Blaine Hamper. Patient case explained and discussed. Dr. Blaine Hamper agrees to admit patient for further evaluation and treatment. He recommends admission for observation with telemetry.     EKG Interpretation  Date/Time:  Monday October 26 2014 01:08:40 EDT Ventricular Rate:  97 PR Interval:  150 QRS Duration: 92 QT Interval:  311 QTC Calculation: 395 R Axis:   18 Text Interpretation:  Sinus rhythm Abnrm T, consider ischemia, anterolateral lds Confirmed by Cinthya Bors,  DO, Rexann Lueras ([id number]) on 10/26/2014 1:23:17 AM       I personally performed the services described in this documentation, which was scribed in my presence. The recorded information has been reviewed and is accurate.    [place], DO 10/26/14 0400

## 2014-10-26 NOTE — ED Notes (Signed)
Dr Ward back at the bedside.

## 2014-10-26 NOTE — ED Notes (Signed)
Dr. Ward at the bedside.  

## 2014-10-26 NOTE — ED Notes (Signed)
Pt taken to xray 

## 2014-10-27 ENCOUNTER — Inpatient Hospital Stay (HOSPITAL_COMMUNITY): Payer: MEDICAID

## 2014-10-27 ENCOUNTER — Inpatient Hospital Stay (HOSPITAL_COMMUNITY): Payer: Self-pay

## 2014-10-27 ENCOUNTER — Ambulatory Visit: Payer: Self-pay | Admitting: Family Medicine

## 2014-10-27 DIAGNOSIS — I270 Primary pulmonary hypertension: Secondary | ICD-10-CM

## 2014-10-27 DIAGNOSIS — R9431 Abnormal electrocardiogram [ECG] [EKG]: Secondary | ICD-10-CM | POA: Diagnosis present

## 2014-10-27 DIAGNOSIS — R079 Chest pain, unspecified: Secondary | ICD-10-CM

## 2014-10-27 LAB — NM MYOCAR MULTI W/SPECT W/WALL MOTION / EF
CHL CUP NUCLEAR SRS: 0
LV sys vol: 79 mL
LVDIAVOL: 177 mL
RATE: 0.19
Rest HR: 78 {beats}/min
SDS: 4
SSS: 4
TID: 0.99

## 2014-10-27 LAB — GLUCOSE, CAPILLARY
GLUCOSE-CAPILLARY: 116 mg/dL — AB (ref 65–99)
Glucose-Capillary: 109 mg/dL — ABNORMAL HIGH (ref 65–99)

## 2014-10-27 LAB — UREA NITROGEN, URINE: Urea Nitrogen, Ur: 383 mg/dL

## 2014-10-27 LAB — HEMOGLOBIN A1C
HEMOGLOBIN A1C: 5.2 % (ref 4.8–5.6)
MEAN PLASMA GLUCOSE: 103 mg/dL

## 2014-10-27 MED ORDER — REGADENOSON 0.4 MG/5ML IV SOLN
INTRAVENOUS | Status: AC
  Filled 2014-10-27: qty 5

## 2014-10-27 MED ORDER — METHYLPREDNISOLONE SODIUM SUCC 125 MG IJ SOLR
60.0000 mg | Freq: Two times a day (BID) | INTRAMUSCULAR | Status: DC
  Administered 2014-10-27 (×2): 60 mg via INTRAVENOUS
  Filled 2014-10-27 (×2): qty 2

## 2014-10-27 MED ORDER — REGADENOSON 0.4 MG/5ML IV SOLN
0.4000 mg | Freq: Once | INTRAVENOUS | Status: AC
Start: 2014-10-27 — End: 2014-10-27
  Administered 2014-10-27: 0.4 mg via INTRAVENOUS
  Filled 2014-10-27: qty 5

## 2014-10-27 MED ORDER — TECHNETIUM TC 99M SESTAMIBI GENERIC - CARDIOLITE
10.0000 | Freq: Once | INTRAVENOUS | Status: AC | PRN
  Administered 2014-10-27: 10 via INTRAVENOUS

## 2014-10-27 MED ORDER — TECHNETIUM TC 99M SESTAMIBI GENERIC - CARDIOLITE
30.0000 | Freq: Once | INTRAVENOUS | Status: AC | PRN
  Administered 2014-10-27: 30 via INTRAVENOUS

## 2014-10-27 NOTE — Clinical Social Work Note (Signed)
Clinical Social Work Assessment  Patient Details  Name: [name] [name] MRN: [id number] Date of Birth: Jan 06, 1961  Date of referral:  10/27/14               Reason for consult:  Facility Placement, Substance Use/ETOH Abuse                Permission sought to share information with:  Case Manager Permission granted to share information::  Yes, Verbal Permission Granted  Name::        Agency::     Relationship::     Contact Information:     Housing/Transportation Living arrangements for the past 2 months:  Hotel/Motel Source of Information:  Patient Patient Interpreter Needed:  None Criminal Activity/Legal Involvement Pertinent to Current Situation/Hospitalization:  No - Comment as needed Significant Relationships:  Adult Children Lives with:  Adult Children Do you feel safe going back to the place where you live?  Yes Need for family participation in patient care:  No (Coment)  Care giving concerns:  PT recommending SNF   Social Worker assessment / plan:  CSW visited pt room to discuss SNF recommendation and substance use. Upon CSW entering room, pt was walking independently around the room with no issues. CSW introduce self and explained role to pt. Pt confused at need for CSW since he feels there is no PT need. Pt explained that he that he is currently living in a motel with his daughter and grandchildren that is why he refused home health services. Pt did also state though that he does not see a need for home health either. Pt continues to decline needs and given pt self pay status and living situation home health is likely not an option. Pt daughter and grandchildren are available should pt need assistance at dc.  Pt informed CSW he has not used drugs for 3 months and does not plan to use again. Pt declining ETOH abuse at this time. Pt will likely need transport assistance home. CSW to work with pt family and pt nurse. If pt family unable to pick pt up, CSW to provide pt with bus pass.   After transportation arranged, pt will have no CSW needs. CSW signing off.  Employment status:    Insurance information:  Self Pay (Medicaid Pending) PT Recommendations:  [place] / Referral to community resources:   (None needed at this time)  Patient/Family's Response to care:  Pt not understanding of recommendations and feels there is no need for therapy follow up.  Patient/Family's Understanding of and Emotional Response to Diagnosis, Current Treatment, and Prognosis:  Pt has a limited but good understanding of medical condition. Pt with appropriate emotional response.   Emotional Assessment Appearance:  Appears stated age, Well-Groomed Attitude/Demeanor/Rapport:  Other (Cooperative) Affect (typically observed):  Calm, Happy, Pleasant Orientation:  Oriented to Self, Oriented to Place, Oriented to  Time, Oriented to Situation Alcohol / Substance use:  Other (Pt admits to past use but denies use in the past 3 months) Psych involvement (Current and /or in the community):  No (Comment)  Discharge Needs  Concerns to be addressed:  No discharge needs identified Readmission within the last 30 days:  No Current discharge risk:  None Barriers to Discharge:  Continued Medical Work up   [employer], [place]

## 2014-10-27 NOTE — Progress Notes (Signed)
*  PRELIMINARY RESULTS* Echocardiogram 2D Echocardiogram has been performed.  [name] [name] 10/27/2014, 2:37 PM

## 2014-10-27 NOTE — Progress Notes (Signed)
OT Cancellation Note  Patient Details Name: [name] [name] MRN: [id number] DOB: May 08, 1960   Cancelled Treatment:    Reason Eval/Treat Not Completed: Patient at procedure or test/ unavailable  Benito Mccreedy OTR/L [phone] 10/27/2014, 8:55 AM

## 2014-10-27 NOTE — Progress Notes (Signed)
Patient Demographics:    [name] [name], is a 54 y.o. male, DOB - 04/01/1961, ZJI:[id number]  Admit date - 10/26/2014   Admitting Physician Ivor Costa, MD  Outpatient Primary MD for the patient is Minerva Ends, MD  LOS - 1   Chief Complaint  Patient presents with  . Chest Pain  . Arm Pain  . Knee Pain        Subjective:    [name] [name] today has, No headache, No chest pain, No abdominal pain - No Nausea, No new weakness tingling or numbness, No Cough - SOB. Is having some pain in his left wrist mostly, minimal in the right knee   Assessment  & Plan :     1. Atypical chest pain. Did have some T-wave inversion in lateral leads. Chest pain resolved, troponin negative, seen by cardiology, currently pain and symptom free, continue on aspirin, pending echocardiogram and Lexiscan ordered by cardiology.   2. Subacute gout in left wrist and right knee. Pain ongoing for the last few weeks, initially got some steroids by PCP and improved, flared up again after steroids were stopped. Uric acid level is elevated, he is allergic to Colchicine, x-ray of the left wrist unremarkable, will place on IV steroids. Hold allopurinol, pain better, wrist splint applied, Monitor.   3. CK D stage III. Baseline creatinine is 1.3. Will avoid NSAIDs, ACE/ARB.   4. Essential hypertension. Placed on Norvasc and hydralazine continue to monitor.   5. COPD. At baseline. Continue supportive care with home nebulizer treatments and oxygen if needed.   6.Chewing Tobacco - Occasional Alcohol, remote history of cocaine abuse. Counseled to quit all.    Code Status : Full  Family Communication  : None present  Disposition Plan  : Home 1-2 days  Consults  :  Cards  Procedures  :   TTE  Lexiscan  DVT Prophylaxis  :     Heparin   Lab Results  Component Value Date   PLT 209 10/26/2014    Inpatient Medications  Scheduled Meds: . amLODipine  10 mg Oral Daily  . aspirin  324 mg Oral Once  . aspirin  325 mg Oral Daily  . atorvastatin  40 mg Oral q1800  . heparin  5,000 Units Subcutaneous 3 times per day  . hydrALAZINE  50 mg Oral 3 times per day  . methylPREDNISolone (SOLU-MEDROL) injection  60 mg Intravenous Q12H  . mometasone-formoterol  2 puff Inhalation BID  . regadenoson      . sodium chloride  3 mL Intravenous Q12H   Continuous Infusions: . sodium chloride 50 mL/hr at 10/26/14 2144   PRN Meds:.acetaminophen **OR** acetaminophen, albuterol, alum & mag hydroxide-simeth, hydrALAZINE, morphine injection, nitroGLYCERIN, ondansetron **OR** ondansetron (ZOFRAN) IV, oxyCODONE-acetaminophen, traMADol   Antibiotics  :     Anti-infectives    None        Objective:   Filed Vitals:   10/27/14 0854 10/27/14 0856 10/27/14 0858 10/27/14 0859  BP: 148/94 151/83 146/83   Pulse: 96 101 99 100  Temp:      TempSrc:      Resp:      Height:      Weight:      SpO2:  Wt Readings from Last 3 Encounters:  10/27/14 83.598 kg (184 lb 4.8 oz)  10/13/14 84.913 kg (187 lb 3.2 oz)  08/20/14 82.555 kg (182 lb)     Intake/Output Summary (Last 24 hours) at 10/27/14 1158 Last data filed at 10/27/14 0900  Gross per 24 hour  Intake    984 ml  Output    700 ml  Net    284 ml     Physical Exam  Awake Alert, Oriented X 3, No new F.N deficits, Normal affect [place].AT,PERRAL Supple Neck,No JVD, No cervical lymphadenopathy appriciated.  Symmetrical Chest wall movement, Good air movement bilaterally, CTAB RRR,No Gallops,Rubs or new Murmurs, No Parasternal Heave +ve B.Sounds, Abd Soft, No tenderness, No organomegaly appriciated, No rebound - guarding or rigidity. No Cyanosis, Clubbing or edema, No new Rash or bruise. Left wrist and right knee are tender, minimal swelling, no warmth or redness.        Data Review:   Micro Results Recent Results (from the past 240 hour(s))  Culture, blood (routine x 2)     Status: None (Preliminary result)   Collection Time: 10/26/14  4:29 AM  Result Value Ref Range Status   Specimen Description BLOOD RIGHT ANTECUBITAL  Final   Special Requests BOTTLES DRAWN AEROBIC AND ANAEROBIC 6CC EA  Final   Culture PENDING  Incomplete   Report Status PENDING  Incomplete    Radiology Reports Dg Chest 2 View  10/26/2014   CLINICAL DATA:  54 year old male with chest pain  EXAM: CHEST  2 VIEW  COMPARISON:  Chest CT dated 12/13/2013  FINDINGS: Two views of the chest demonstrate emphysematous changes of the lungs. No focal consolidation, pleural effusion, or pneumothorax. Bibasilar subsegmental linear atelectasis/ scarring noted. The cardiomediastinal silhouette is within normal limits. The osseous structures are grossly unremarkable.  IMPRESSION: Emphysema.  No focal consolidation or pneumothorax.   Electronically Signed   By: Anner Crete M.D.   On: 10/26/2014 02:00   Dg Wrist Complete Left  10/26/2014   CLINICAL DATA:  Left wrist swelling and pain for 1 month, history of gout  EXAM: LEFT WRIST - COMPLETE 3+ VIEW  COMPARISON:  06/12/2009 thumb radiographs  FINDINGS: There is no evidence of fracture or dislocation. There is no evidence of arthropathy or other focal bone abnormality. Soft tissues are unremarkable. Minimal spurring at the ulnar styloid process.  IMPRESSION: Negative.   Electronically Signed   By: Conchita Paris M.D.   On: 10/26/2014 08:48     CBC  Recent Labs Lab 10/26/14 0110  WBC 13.6*  HGB 15.2  HCT 46.6  PLT 209  MCV 76.6*  MCH 25.0*  MCHC 32.6  RDW 13.6  LYMPHSABS 3.4  MONOABS 2.0*  EOSABS 0.3  BASOSABS 0.1    Chemistries   Recent Labs Lab 10/26/14 0110  NA 135  K 4.4  CL 99*  CO2 26  GLUCOSE 73  BUN 17  CREATININE 1.34*  CALCIUM 9.2    ------------------------------------------------------------------------------------------------------------------ estimated creatinine clearance is 65.6 mL/min (by C-G formula based on Cr of 1.34). ------------------------------------------------------------------------------------------------------------------  Recent Labs  10/26/14 0429  HGBA1C 5.2   ------------------------------------------------------------------------------------------------------------------  Recent Labs  10/26/14 0429  CHOL 156  HDL 38*  LDLCALC 108*  TRIG 49  CHOLHDL 4.1   ------------------------------------------------------------------------------------------------------------------ No results for input(s): TSH, T4TOTAL, T3FREE, THYROIDAB in the last 72 hours.  Invalid input(s): FREET3 ------------------------------------------------------------------------------------------------------------------ No results for input(s): VITAMINB12, FOLATE, FERRITIN, TIBC, IRON, RETICCTPCT in the last 72 hours.  Coagulation profile  Recent Labs  Lab 10/26/14 0429  INR 1.06    No results for input(s): DDIMER in the last 72 hours.  Cardiac Enzymes  Recent Labs Lab 10/26/14 0429 10/26/14 0959 10/26/14 1525  TROPONINI 0.03 <0.03 0.04*   ------------------------------------------------------------------------------------------------------------------ Invalid input(s): POCBNP   Time Spent in minutes  35   Licia Harl K M.D on 10/27/2014 at 11:58 AM  Between 7am to 7pm - Pager - [phone]  After 7pm go to [email or web address] - password [facility]  [facility] -  Office  [phone]

## 2014-10-27 NOTE — Care Management Note (Signed)
Case Management Note  Patient Details  Name: [name] [name] MRN: [id number] Date of Birth: 10-05-1960  Subjective/Objective: Pt admitted for cp. Increased Uric Acid level. Initiated on IV Solumedrol.                     Action/Plan: CSW did speak with pt in regards to substance abuse resources. No needs identified by CM at this time. Will continue to monitor.    Expected Discharge Date:                  Expected Discharge Plan:  Home/Self Care  In-House Referral:  Clinical Social Work  Discharge planning Services  CM Consult  Post Acute Care Choice:  NA Choice offered to:  NA  DME Arranged:  N/A DME Agency:  NA  HH Arranged:  NA HH Agency:  NA  Status of Service:  Completed, signed off  Medicare Important Message Given:    Date Medicare IM Given:    Medicare IM give by:    Date Additional Medicare IM Given:    Additional Medicare Important Message give by:     If discussed at [place] of Stay Meetings, dates discussed:    Additional Comments:  Bethena Roys, RN 10/27/2014, 2:50 PM

## 2014-10-27 NOTE — Evaluation (Addendum)
Occupational Therapy Evaluation Patient Details Name: [name] [name] MRN: [id number] DOB: 12-Jul-1960 Today's Date: 10/27/2014    History of Present Illness 54 y.o. male with PMH of hypertension, gout, COPD on 2 L oxygen at home, cocaine abuse, CKD-II, who presented with chest pain and pain over left wrist and right knee.   Clinical Impression   Pt admitted due to above. Pt independent with ADLs, PTA. Feel pt will benefit from acute OT to reinforce energy conservation prior to d/c. Pt's O2 dropping to as low as 82% in session on RA.     Follow Up Recommendations  No OT follow up;Supervision - Intermittent    Equipment Recommendations  Tub/shower seat    Recommendations for Other Services       Precautions / Restrictions Precautions Precaution Comments: watch O2 sats Required Braces or Orthoses:  (has sling in room; splint on left wrist) Restrictions Weight Bearing Restrictions: No      Mobility Bed Mobility Overal bed mobility: Modified Independent       Supine to sit: Modified independent (Device/Increase time)        Transfers Overall transfer level: Modified independent                    Balance  No LOB in session. No apparent balance deficits, but not formally assessed.                                          ADL Overall ADL's : Needs assistance/impaired     Grooming: Wash/dry face;Standing;Set up;Supervision/safety               Lower Body Dressing: Minimal assistance;Sit to/from stand   Toilet Transfer: Supervision/safety;Ambulation (sit to stand from bed)           Functional mobility during ADLs: Supervision/safety General ADL Comments: Recommended pt to elevate LUE due to edema. Educated on energy conservation techniques and gave him handout. Had a discussion with pt about being compliant with O2 use at home. Educated on what pt could use for shower chair. mentioned use of long sponge for LB bathing. Pt verbalized  he knows how to manage left splint and sling, so did not go over.     Vision     Perception     Praxis      Pertinent Vitals/Pain Pain Assessment: 0-10 Pain Score:  (3-4) Pain Location: left wrist Pain Intervention(s): Monitored during session (notified nurse)   O2 in low 90s sitting EOB on RA (took O2 off at beginning of session and monitored). Pt ambulated to door in room and washed face at sink and pt's O2 dropped as low as 82% once sitting back on EOB. O2 did trend up to 90s on RA. Placed pt back on 3L of O2 at end of session. Notified nurse.     Hand Dominance     Extremity/Trunk Assessment Upper Extremity Assessment Upper Extremity Assessment: LUE deficits/detail LUE Deficits / Details: left wrist in splint; edema in left digits/hand LUE Coordination: decreased fine motor   Lower Extremity Assessment Lower Extremity Assessment: Defer to PT evaluation       Communication Communication Communication: No difficulties   Cognition Arousal/Alertness: Awake/alert Behavior During Therapy: WFL for tasks assessed/performed Overall Cognitive Status: Within Functional Limits for tasks assessed  General Comments       Exercises       Shoulder Instructions      Home Living Family/patient expects to be discharged to:: Private residence Living Arrangements: Children Available Help at Discharge: Family;Available 24 hours/day Type of Home: House Home Access: Level entry     Home Layout: One level         Bathroom Toilet: Standard Bathroom Accessibility: No   Home Equipment: Cane - single point;Crutches (on 2L of O2 at night)          Prior Functioning/Environment Level of Independence: Independent with assistive device(s)             OT Diagnosis: Acute pain   OT Problem List: Decreased knowledge of use of DME or AE;Decreased knowledge of precautions;Cardiopulmonary status limiting activity;Impaired UE functional  use;Pain;Increased edema;Decreased coordination;Decreased activity tolerance;Decreased range of motion   OT Treatment/Interventions: Self-care/ADL training;DME and/or AE instruction;Therapeutic exercise;Energy conservation;Therapeutic activities;Balance training;Patient/family education    OT Goals(Current goals can be found in the care plan section) Acute Rehab OT Goals Patient Stated Goal: go back to work OT Goal Formulation: With patient Time For Goal Achievement: 11/03/14 Potential to Achieve Goals: Good ADL Goals Additional ADL Goal #1: Pt will independently verbalize 3/3 energy conservation techniques and utilize during ADLs/mobility during session.  OT Frequency: Min 2X/week   Barriers to D/C:            Co-evaluation              End of Session Equipment Utilized During Treatment: Gait belt;Oxygen (took O2 off towards beginning and placed back on towards end) Nurse Communication: Other (comment) (O2 sats; pain in left wrist)  Activity Tolerance: Patient tolerated treatment well Patient left: in bed   Time: 1200-1216 OT Time Calculation (min): 16 min Charges:  OT General Charges $OT Visit: 1 Procedure OT Evaluation $Initial OT Evaluation Tier I: 1 Procedure G-CodesBenito Mccreedy OTR/L [phone] 10/27/2014, 12:37 PM

## 2014-10-27 NOTE — Progress Notes (Signed)
Subjective:  No chest pain overnight  Objective:  Vital Signs in the last 24 hours: Temp:  [97.9 F (36.6 C)-98.3 F (36.8 C)] 97.9 F (36.6 C) (07/19 0500) Pulse Rate:  [70-101] 100 (07/19 0859) Resp:  [18] 18 (07/19 0500) BP: (132-151)/(74-105) 146/83 mmHg (07/19 0858) SpO2:  [90 %-98 %] 95 % (07/19 0500) Weight:  [184 lb 4.8 oz (83.598 kg)] 184 lb 4.8 oz (83.598 kg) (07/19 0500)  Intake/Output from previous day:  Intake/Output Summary (Last 24 hours) at 10/27/14 0907 Last data filed at 10/27/14 0552  Gross per 24 hour  Intake   1200 ml  Output    700 ml  Net    500 ml    Physical Exam: General appearance: alert, cooperative, no distress and mildly SOB at rest Lungs: decreased breath sounds, no wheezing Heart: regular rate and rhythm Abdomen: soft, non-tender; bowel sounds normal; no masses,  no organomegaly   Rate: 78  Rhythm: normal sinus rhythm  Lab Results:  Recent Labs  10/26/14 0110  WBC 13.6*  HGB 15.2  PLT 209    Recent Labs  10/26/14 0110  NA 135  K 4.4  CL 99*  CO2 26  GLUCOSE 73  BUN 17  CREATININE 1.34*    Recent Labs  10/26/14 0959 10/26/14 1525  TROPONINI <0.03 0.04*    Recent Labs  10/26/14 0429  INR 1.06    Scheduled Meds: . allopurinol  300 mg Oral Daily  . amLODipine  10 mg Oral Daily  . aspirin  324 mg Oral Once  . aspirin  325 mg Oral Daily  . atorvastatin  40 mg Oral q1800  . heparin  5,000 Units Subcutaneous 3 times per day  . hydrALAZINE  50 mg Oral 3 times per day  . methylPREDNISolone (SOLU-MEDROL) injection  80 mg Intravenous Q12H  . mometasone-formoterol  2 puff Inhalation BID  . regadenoson      . sodium chloride  3 mL Intravenous Q12H   Continuous Infusions: . sodium chloride 50 mL/hr at 10/26/14 2144   PRN Meds:.acetaminophen **OR** acetaminophen, albuterol, alum & mag hydroxide-simeth, hydrALAZINE, morphine injection, nitroGLYCERIN, ondansetron **OR** ondansetron (ZOFRAN) IV,  oxyCODONE-acetaminophen, traMADol   Imaging: Dg Chest 2 View  10/26/2014   CLINICAL DATA:  54 year old male with chest pain  EXAM: CHEST  2 VIEW  COMPARISON:  Chest CT dated 12/13/2013  FINDINGS: Two views of the chest demonstrate emphysematous changes of the lungs. No focal consolidation, pleural effusion, or pneumothorax. Bibasilar subsegmental linear atelectasis/ scarring noted. The cardiomediastinal silhouette is within normal limits. The osseous structures are grossly unremarkable.  IMPRESSION: Emphysema.  No focal consolidation or pneumothorax.   Electronically Signed   By: Anner Crete M.D.   On: 10/26/2014 02:00   Dg Wrist Complete Left  10/26/2014   CLINICAL DATA:  Left wrist swelling and pain for 1 month, history of gout  EXAM: LEFT WRIST - COMPLETE 3+ VIEW  COMPARISON:  06/12/2009 thumb radiographs  FINDINGS: There is no evidence of fracture or dislocation. There is no evidence of arthropathy or other focal bone abnormality. Soft tissues are unremarkable. Minimal spurring at the ulnar styloid process.  IMPRESSION: Negative.   Electronically Signed   By: Conchita Paris M.D.   On: 10/26/2014 08:48     Assessment/Plan:  54 y.o.AA male with PMH of hypertension, gout, COPD on 2 L oxygen at home, prior cocaine abuse, CKD-II, who presents with chest pain. 3d Troponin 0.04. EKG abnormal with TWI.  Principal Problem:   Chest pain with moderate risk of acute coronary syndrome Active Problems:   Chronic renal impairment, stage 3 (moderate)   COPD bronchitis   Hypertension   Abnormal EKG   Gout   Moderate to severe pulmonary hypertension   PLAN: Lexiscan Myoview today.  [employer] PA-C 10/27/2014, 9:07 AM [phone]

## 2014-10-28 DIAGNOSIS — I10 Essential (primary) hypertension: Secondary | ICD-10-CM

## 2014-10-28 DIAGNOSIS — N183 Chronic kidney disease, stage 3 (moderate): Secondary | ICD-10-CM

## 2014-10-28 DIAGNOSIS — R079 Chest pain, unspecified: Secondary | ICD-10-CM

## 2014-10-28 LAB — GLUCOSE, CAPILLARY: GLUCOSE-CAPILLARY: 140 mg/dL — AB (ref 65–99)

## 2014-10-28 MED ORDER — LOSARTAN POTASSIUM 50 MG PO TABS
50.0000 mg | ORAL_TABLET | Freq: Every day | ORAL | Status: DC
  Administered 2014-10-28: 50 mg via ORAL
  Filled 2014-10-28: qty 1

## 2014-10-28 MED ORDER — ATORVASTATIN CALCIUM 40 MG PO TABS: 40.0000 mg | ORAL_TABLET | Freq: Every day | ORAL | Status: DC

## 2014-10-28 MED ORDER — PREDNISONE 10 MG PO TABS: ORAL_TABLET | ORAL | Status: DC

## 2014-10-28 MED ORDER — AMLODIPINE BESYLATE 10 MG PO TABS: 10.0000 mg | ORAL_TABLET | Freq: Every day | ORAL | Status: DC

## 2014-10-28 MED ORDER — PREDNISONE 20 MG PO TABS
40.0000 mg | ORAL_TABLET | Freq: Every day | ORAL | Status: DC
Start: 2014-10-29 — End: 2014-10-28

## 2014-10-28 NOTE — Progress Notes (Signed)
Occupational Therapy Treatment Patient Details Name: Clemens Lachman MRN: [id number] DOB: 1961-03-24 Today's Date: 10/28/2014    History of present illness 54 y.o. male with PMH of hypertension, gout, COPD on 2 L oxygen at home, cocaine abuse, CKD-II, who presented with chest pain and pain over left wrist and right knee.   OT comments  Pt ready for DC for did verbalize understanding of what OT presented           Precautions / Restrictions Precautions Precautions: Fall Restrictions Weight Bearing Restrictions: No       Mobility Bed Mobility Overal bed mobility: Modified Independent       Supine to sit: Modified independent (Device/Increase time)        Transfers Overall transfer level: Modified independent                    Balance                                   ADL Overall ADL's : Needs assistance/impaired                                       General ADL Comments: educated on energy conservation stategies, using oxygen as MD inidicated, elevation of LUE for edema and ROM of LUE.  Pt very focused on his DC. Pt did verbalize understanding.      Vision                     Perception     Praxis      Cognition   Behavior During Therapy: WFL for tasks assessed/performed Overall Cognitive Status: Within Functional Limits for tasks assessed                       Extremity/Trunk Assessment               Exercises     Shoulder Instructions       General Comments      Pertinent Vitals/ Pain       Pain Assessment: No/denies pain  Home Living                                          Prior Functioning/Environment              Frequency       Progress Toward Goals  OT Goals(current goals can now be found in the care plan section)  Progress towards OT goals: Progressing toward goals     Plan Discharge plan remains appropriate    Co-evaluation                  End of Session     Activity Tolerance Patient tolerated treatment well   Patient Left Other (comment) (in transport chair for DC)   Nurse Communication          Time: 1856-3149 OT Time Calculation (min): 10 min  Charges: OT General Charges $OT Visit: 1 Procedure OT Treatments $Self Care/Home Management : 8-22 mins  Jarris Kortz, Edwena Felty D 10/28/2014, 12:55 PM

## 2014-10-28 NOTE — Discharge Summary (Signed)
Physician Discharge Summary  [name] [name] GDJ:[id number] DOB: 12-06-1960 DOA: 10/26/2014  PCP: Minerva Ends, MD  Admit date: 10/26/2014 Discharge date: 10/28/2014  Time spent: 35 minutes  Recommendations for Outpatient Follow-up:  1. Follow-up at the Center in 2-4 weeks to follow-up on his gout.  Discharge Diagnoses:  Principal Problem:   Chest pain with moderate risk of acute coronary syndrome Active Problems:   Chronic renal impairment, stage 3 (moderate)   COPD bronchitis   Gout   Hypertension   Moderate to severe pulmonary hypertension   Abnormal EKG   Discharge Condition: stable  Diet recommendation: heart healthy  Filed Weights   10/26/14 0356 10/27/14 0500 10/28/14 0600  Weight: 81.4 kg (179 lb 7.3 oz) 83.598 kg (184 lb 4.8 oz) 83.326 kg (183 lb 11.2 oz)    History of present illness:  54 y.o. male with PMH of hypertension, gout, COPD on 2 L oxygen at home, cocaine abuse, CKD-II, who presents with chest pain and pain over left wrist and right knee.  Patient reports that he started having chest pain at about 3 PM. It is located in the substernal area, constant, 4 out of 10 in severity, nonradiating. It is not aggravated or alleviated by any known factors. It is associated with shortness of breath. He states that he has mild cough due to COPD, which is at his baseline. His chest pain has been intermittently accentuated, then resolved spontaneously at about 10:00 PM. Currently no chest pain and shortness of breath.  Hospital Course:  Atypical chest pain: He did have some T-wave inversion on admission cardiac enzymes are negative, cardiology was consulted. He was started on aspirin and beta blockers echo was done with results as below Lexi scan Myoview was negative for any ischemic events. He'll follow up with his primary care doctor as an outpatient.  Subacute left wrist and right knee gout: Started on prednisone orally and it improved slowly.  Neck NEC stage  III: At baseline avoidances and Ace inhibitors.  Essential hypertension continue current regimen.  Procedures:  LExiscan: negative for reversible ischemia  Consultations:  cardiology  Discharge Exam: Filed Vitals:   10/28/14 0810  BP: 141/80  Pulse:   Temp:   Resp:     General: A&O x3 Cardiovascular: RRR Respiratory: good air movement CTA B/L  Discharge Instructions    Current Discharge Medication List    START taking these medications   Details  amLODipine (NORVASC) 10 MG tablet Take 1 tablet (10 mg total) by mouth daily. Qty: 30 tablet, Refills: 0    atorvastatin (LIPITOR) 40 MG tablet Take 1 tablet (40 mg total) by mouth daily at 6 PM. Qty: 30 tablet, Refills: 0    predniSONE (DELTASONE) 10 MG tablet Takes 6 tablets for 1 days, then 5 tablets for 1 days, then 4 tablets for 1 days, then 3 tablets for 1 days, then 2 tabs for 1 days, then 1 tab for 1 days, and then stop. Qty: 42 tablet, Refills: 0      CONTINUE these medications which have NOT CHANGED   Details  albuterol (PROVENTIL HFA;VENTOLIN HFA) 108 (90 BASE) MCG/ACT inhaler Inhale 2 puffs into the lungs every 4 (four) hours as needed for wheezing or shortness of breath. Qty: 18 g, Refills: 11   Associated Diagnoses: Simple chronic bronchitis; COPD bronchitis    Fluticasone-Salmeterol (ADVAIR) 100-50 MCG/DOSE AEPB Inhale 1 puff into the lungs 2 (two) times daily. Qty: 180 each, Refills: 11   Associated Diagnoses: Simple chronic bronchitis  losartan (COZAAR) 50 MG tablet Take 1 tablet (50 mg total) by mouth daily. Qty: 90 tablet, Refills: 3   Associated Diagnoses: Essential hypertension    traMADol (ULTRAM) 50 MG tablet Take 1 tablet (50 mg total) by mouth every 8 (eight) hours as needed. Qty: 30 tablet, Refills: 0   Associated Diagnoses: Gout without tophus, unspecified cause, unspecified chronicity, unspecified site    allopurinol (ZYLOPRIM) 100 MG tablet Take 1 tablet (100 mg total) by mouth  daily. Qty: 30 tablet, Refills: 6   Associated Diagnoses: Gout without tophus, unspecified cause, unspecified chronicity, unspecified site      STOP taking these medications     methylPREDNISolone (MEDROL DOSEPAK) 4 MG TBPK tablet        Allergies  Allergen Reactions  . Shellfish Allergy Anaphylaxis  . Colchicine Hives      The results of significant diagnostics from this hospitalization (including imaging, microbiology, ancillary and laboratory) are listed below for reference.    Significant Diagnostic Studies: Dg Chest 2 View  10/26/2014   CLINICAL DATA:  54 year old male with chest pain  EXAM: CHEST  2 VIEW  COMPARISON:  Chest CT dated 12/13/2013  FINDINGS: Two views of the chest demonstrate emphysematous changes of the lungs. No focal consolidation, pleural effusion, or pneumothorax. Bibasilar subsegmental linear atelectasis/ scarring noted. The cardiomediastinal silhouette is within normal limits. The osseous structures are grossly unremarkable.  IMPRESSION: Emphysema.  No focal consolidation or pneumothorax.   Electronically Signed   By: Anner Crete M.D.   On: 10/26/2014 02:00   Dg Wrist Complete Left  10/26/2014   CLINICAL DATA:  Left wrist swelling and pain for 1 month, history of gout  EXAM: LEFT WRIST - COMPLETE 3+ VIEW  COMPARISON:  06/12/2009 thumb radiographs  FINDINGS: There is no evidence of fracture or dislocation. There is no evidence of arthropathy or other focal bone abnormality. Soft tissues are unremarkable. Minimal spurring at the ulnar styloid process.  IMPRESSION: Negative.   Electronically Signed   By: Conchita Paris M.D.   On: 10/26/2014 08:48   Nm Myocar Multi W/spect W/wall Motion / Ef  10/27/2014    There was no ST segment deviation noted during stress.  This is a low risk study.  The left ventricular ejection fraction is normal (55-65%).  Nuclear stress EF: 55%.   Low risk stess nuclear study with a moderate size, medium intensity, fixed  inferior  defect suggestive of diaphragmatic attenuation; no ischemia; EF  55 with normal wall motion; mild LVE.    Microbiology: Recent Results (from the past 240 hour(s))  Culture, blood (routine x 2)     Status: None (Preliminary result)   Collection Time: 10/26/14  4:29 AM  Result Value Ref Range Status   Specimen Description BLOOD RIGHT ANTECUBITAL  Final   Special Requests BOTTLES DRAWN AEROBIC AND ANAEROBIC 6CC EA  Final   Culture NO GROWTH 1 DAY  Final   Report Status PENDING  Incomplete  Culture, blood (routine x 2)     Status: None (Preliminary result)   Collection Time: 10/26/14  4:33 AM  Result Value Ref Range Status   Specimen Description BLOOD RIGHT HAND  Final   Special Requests IN PEDIATRIC BOTTLE 3CC  Final   Culture NO GROWTH 1 DAY  Final   Report Status PENDING  Incomplete     Labs: Basic Metabolic Panel:  Recent Labs Lab 10/26/14 0110  NA 135  K 4.4  CL 99*  CO2 26  GLUCOSE 73  BUN 17  CREATININE 1.34*  CALCIUM 9.2   Liver Function Tests: No results for input(s): AST, ALT, ALKPHOS, BILITOT, PROT, ALBUMIN in the last 168 hours. No results for input(s): LIPASE, AMYLASE in the last 168 hours. No results for input(s): AMMONIA in the last 168 hours. CBC:  Recent Labs Lab 10/26/14 0110  WBC 13.6*  NEUTROABS 8.1*  HGB 15.2  HCT 46.6  MCV 76.6*  PLT 209   Cardiac Enzymes:  Recent Labs Lab 10/26/14 0429 10/26/14 0959 10/26/14 1525  TROPONINI 0.03 <0.03 0.04*   BNP: BNP (last 3 results) No results for input(s): BNP in the last 8760 hours.  ProBNP (last 3 results)  Recent Labs  12/11/13 0130  PROBNP 2989.0*    CBG:  Recent Labs Lab 10/26/14 0744 10/27/14 0724 10/27/14 1619 10/28/14 0735  GLUCAP 117* 116* 109* 140*       Signed:  Charlynne Cousins  [facility] 10/28/2014, 10:47 AM

## 2014-10-28 NOTE — Progress Notes (Signed)
PT Cancellation Note  Patient Details Name: [name] [name] MRN: [id number] DOB: March 31, 1961   Cancelled Treatment:    Reason Eval/Treat Not Completed: Patient declined, no reason specified.  Discharge note in from MD.  Will check if pt here tomorrow   [name] 10/28/2014, 12:17 PM   [name], PT MS Acute Rehab Dept. Number: [facility] [id number] and [place] [phone]

## 2014-10-28 NOTE — Progress Notes (Signed)
Pt inquiring about discharge medications. RN gone over new medications in detail. Pt also instructed to get 02 portable tanks delivered to home for increased SOB episodes, pt states he understands, and will handle everything today. Pt being discharged today, pt [facility] Pt has bus passes. RN made pt aware of how important it is to wear 02, pt states he will only wear it at bedtime. Pt educated. IV removed, VSS

## 2014-10-30 ENCOUNTER — Emergency Department (HOSPITAL_COMMUNITY)
Admission: EM | Admit: 2014-10-30 | Discharge: 2014-10-30 | Disposition: A | Discharge status: Home / Self Care | Payer: Self-pay | Attending: Emergency Medicine | Admitting: Emergency Medicine

## 2014-10-30 ENCOUNTER — Telehealth: Payer: Self-pay | Admitting: Family Medicine

## 2014-10-30 ENCOUNTER — Encounter (HOSPITAL_COMMUNITY): Payer: Self-pay | Admitting: Emergency Medicine

## 2014-10-30 ENCOUNTER — Ambulatory Visit: Payer: MEDICAID | Admitting: Family Medicine

## 2014-10-30 DIAGNOSIS — L299 Pruritus, unspecified: Secondary | ICD-10-CM | POA: Insufficient documentation

## 2014-10-30 DIAGNOSIS — J449 Chronic obstructive pulmonary disease, unspecified: Secondary | ICD-10-CM | POA: Insufficient documentation

## 2014-10-30 DIAGNOSIS — I129 Hypertensive chronic kidney disease with stage 1 through stage 4 chronic kidney disease, or unspecified chronic kidney disease: Secondary | ICD-10-CM | POA: Insufficient documentation

## 2014-10-30 DIAGNOSIS — Y9289 Other specified places as the place of occurrence of the external cause: Secondary | ICD-10-CM | POA: Insufficient documentation

## 2014-10-30 DIAGNOSIS — Z8701 Personal history of pneumonia (recurrent): Secondary | ICD-10-CM | POA: Insufficient documentation

## 2014-10-30 DIAGNOSIS — T7840XA Allergy, unspecified, initial encounter: Secondary | ICD-10-CM | POA: Insufficient documentation

## 2014-10-30 DIAGNOSIS — Y998 Other external cause status: Secondary | ICD-10-CM | POA: Insufficient documentation

## 2014-10-30 DIAGNOSIS — Z87891 Personal history of nicotine dependence: Secondary | ICD-10-CM | POA: Insufficient documentation

## 2014-10-30 DIAGNOSIS — Z79899 Other long term (current) drug therapy: Secondary | ICD-10-CM | POA: Insufficient documentation

## 2014-10-30 DIAGNOSIS — T466X5A Adverse effect of antihyperlipidemic and antiarteriosclerotic drugs, initial encounter: Secondary | ICD-10-CM | POA: Insufficient documentation

## 2014-10-30 DIAGNOSIS — Z862 Personal history of diseases of the blood and blood-forming organs and certain disorders involving the immune mechanism: Secondary | ICD-10-CM | POA: Insufficient documentation

## 2014-10-30 DIAGNOSIS — M109 Gout, unspecified: Secondary | ICD-10-CM | POA: Insufficient documentation

## 2014-10-30 DIAGNOSIS — Y9389 Activity, other specified: Secondary | ICD-10-CM | POA: Insufficient documentation

## 2014-10-30 DIAGNOSIS — Z7951 Long term (current) use of inhaled steroids: Secondary | ICD-10-CM | POA: Insufficient documentation

## 2014-10-30 DIAGNOSIS — N182 Chronic kidney disease, stage 2 (mild): Secondary | ICD-10-CM | POA: Insufficient documentation

## 2014-10-30 MED ORDER — TRAMADOL HCL 50 MG PO TABS: 50.0000 mg | ORAL_TABLET | Freq: Four times a day (QID) | ORAL | Status: DC | PRN

## 2014-10-30 MED ORDER — ALBUTEROL (5 MG/ML) CONTINUOUS INHALATION SOLN
10.0000 mg/h | INHALATION_SOLUTION | RESPIRATORY_TRACT | Status: AC
  Administered 2014-10-30: 10 mg/h via RESPIRATORY_TRACT
  Filled 2014-10-30: qty 20

## 2014-10-30 MED ORDER — HYDROCODONE-ACETAMINOPHEN 5-325 MG PO TABS
1.0000 | ORAL_TABLET | Freq: Once | ORAL | Status: AC
  Administered 2014-10-30: 1 via ORAL
  Filled 2014-10-30: qty 1

## 2014-10-30 NOTE — Telephone Encounter (Signed)
Pt states he woke up with face swelling this morning after starting to use new medication yesterday. Pt states he is on the way to clinic to pick up other medication and would like to be seen.

## 2014-10-30 NOTE — Discharge Instructions (Signed)
Return here as needed.  Follow-up with primary care doctor.  Use your oxygen at home for any shortness of breath

## 2014-10-30 NOTE — Telephone Encounter (Signed)
Nurse called patient at number listed on chart. Person answering telephone advised nurse that patient does not live there anymore, this is not patients number.

## 2014-10-30 NOTE — ED Provider Notes (Signed)
CSN: [id number]     Arrival date & time 10/30/14  1123 History   First MD Initiated Contact with Patient 10/30/14 1132     Chief Complaint  Patient presents with  . Allergic Reaction     (Consider location/radiation/quality/duration/timing/severity/associated sxs/prior Treatment) HPI Patient presents to the emergency department with an allergic reaction that occurred last night.  The patient states that he took his Lipitor and then 30 minutes later noticed that he had hives and itching.  The patient states that he woke up this morning and his face was swollen and he felt like his lips were also swollen.  The patient states that nothing seemed to make his condition, better or worse.  He did take some Benadryl which helped with the itching.  She denies chest pain, shortness of breath, nausea, vomiting, weakness, dizziness, headache, blurred vision, back pain, neck pain, fever, cough, dysuria, incontinence, weakness, or syncope.  The patient states that he is due to see his primary care doctor today Past Medical History  Diagnosis Date  . Gout   . Thrombocytopenia 07/28/2012  . Tobacco abuse 07/29/2012  . Influenza with respiratory manifestations 07/30/2012  . Asthma 1999  . 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 COPD (chronic obstructive pulmonary disease) 12/15/2013  . Essential hypertension   . Pneumonia    Past Surgical History  Procedure Laterality Date  . Knee surgery      right knee s/p trauma  . Fracture surgery  childhood    right arm   Family History  Problem Relation Age of Onset  . Colon cancer      mother ? age of diagnosis   . Cancer Mother     colon   . Heart disease Father    History  Substance Use Topics  . Smoking status: Former Smoker -- 1.00 packs/day for 20 years    Quit date: 12/15/2013  . Smokeless tobacco: Never Used     Comment: 'Quitting"  . Alcohol Use: Yes     Comment: occaisional beer    Review of Systems  All other systems  negative except as documented in the HPI. All pertinent positives and negatives as reviewed in the HPI.  Allergies  Atorvastatin; Shellfish allergy; and Colchicine  Home Medications   Prior to Admission medications   Medication Sig Start Date End Date Taking? Authorizing Provider  albuterol (PROVENTIL HFA;VENTOLIN HFA) 108 (90 BASE) MCG/ACT inhaler Inhale 2 puffs into the lungs every 4 (four) hours as needed for wheezing or shortness of breath. 08/20/14  Yes Josalyn Funches, MD  diphenhydrAMINE (BENADRYL) 25 MG tablet Take 50 mg by mouth once as needed for itching or allergies.   Yes Historical Provider, MD  Fluticasone-Salmeterol (ADVAIR) 100-50 MCG/DOSE AEPB Inhale 1 puff into the lungs 2 (two) times daily. 08/20/14  Yes Josalyn Funches, MD  losartan (COZAAR) 50 MG tablet Take 1 tablet (50 mg total) by mouth daily. 02/17/14  Yes Josalyn Funches, MD  mometasone-formoterol (DULERA) 100-5 MCG/ACT AERO Inhale 1 puff into the lungs 2 (two) times daily.   Yes Historical Provider, MD  traMADol (ULTRAM) 50 MG tablet Take 1 tablet (50 mg total) by mouth every 8 (eight) hours as needed. Patient taking differently: Take 50 mg by mouth every 8 (eight) hours as needed for moderate pain.  10/13/14  Yes Josalyn Funches, MD  allopurinol (ZYLOPRIM) 100 MG tablet Take 1 tablet (100 mg total) by mouth daily. 10/13/14   Josalyn Funches, MD  amLODipine (NORVASC) 10 MG  tablet Take 1 tablet (10 mg total) by mouth daily. 10/28/14   Charlynne Cousins, MD  atorvastatin (LIPITOR) 40 MG tablet Take 1 tablet (40 mg total) by mouth daily at 6 PM. Patient not taking: Reported on 10/30/2014 10/28/14   Charlynne Cousins, MD  predniSONE (DELTASONE) 10 MG tablet Takes 6 tablets for 1 days, then 5 tablets for 1 days, then 4 tablets for 1 days, then 3 tablets for 1 days, then 2 tabs for 1 days, then 1 tab for 1 days, and then stop. 10/28/14   Charlynne Cousins, MD   BP 136/90 mmHg  Pulse 110  Temp(Src) 98.6 F (37 C) (Oral)   Resp 22  Ht 5\' 8"  (1.727 m)  Wt 180 lb (81.647 kg)  BMI 27.38 kg/m2  SpO2 87% Physical Exam  Constitutional: He is oriented to person, place, and time. He appears well-developed and well-nourished. No distress.  HENT:  Head: Normocephalic and atraumatic.  Mouth/Throat: Oropharynx is clear and moist.  Eyes: Pupils are equal, round, and reactive to light.  Neck: Normal range of motion. Neck supple.  Cardiovascular: Normal rate, regular rhythm and normal heart sounds.  Exam reveals no gallop and no friction rub.   No murmur heard. Neurological: He is alert and oriented to person, place, and time. He exhibits normal muscle tone. Coordination normal.  Skin: Skin is warm and dry. No rash noted. No erythema.  Psychiatric: He has a normal mood and affect. His behavior is normal.  Nursing note and vitals reviewed.   ED Course  Procedures (including critical care time) Labs Review Labs Reviewed - No data to display  Imaging Review No results found.  Patient is vastly improved following medications by EMS.  Patient does not currently have any symptoms of allergic reaction is been monitored here in the emergency department over several hours.  Patient would like to be discharged home.  I advised follow-up with his primary care Dr. told to return here as needed    Dalia Heading, PA-C 10/30/14 [place], MD 11/01/14 [phone]

## 2014-10-30 NOTE — Discharge Planning (Signed)
NCM consulted to see if pt could still go to [facility] to pick up meds.  Pt had appt with [place], but came to [facility] instead.  NCM called [facility] to find that medication was filled and may be picked up at anytime.

## 2014-10-30 NOTE — ED Notes (Addendum)
Pt states he started itching last night and had hives pt took Benadryl and went to bed. Pt woke up this am with his eyes swollen,lipsand tongue swollen. Pt reported SOB at the time of EMS arrival. In route ems gave EPI 1:1000 IM, benadryl po 50 mg, Zantac IV 50 mg, soul mederol 125 mcg, and 500 NS bolus. Swelling has improved and pt denies any SOB or pain. Pts lung sounds are clear with unlabored respirations. Pt took Lipitor for the first time last night.

## 2014-10-31 LAB — CULTURE, BLOOD (ROUTINE X 2)
CULTURE: NO GROWTH
CULTURE: NO GROWTH

## 2014-11-03 NOTE — Progress Notes (Signed)
OT Note - Addendum    10/27/14 1240  OT Visit Information  Last OT Received On November 14, 2014  OT G-codes **NOT FOR INPATIENT CLASS**  Functional Assessment Tool Used clinical judgement  Functional Limitation Self care  Self Care Current Status [phone]) CI  Self Care Goal Status [phone]) CI  Maurie Boettcher, OTR/L  [phone] 11/14/14    10/27/14 1240  OT Visit Information  Last OT Received On 11-14-14  OT G-codes **NOT FOR INPATIENT CLASS**  Functional Assessment Tool Used clinical judgement  Functional Limitation Self care  Self Care Current Status ([id number]) CI  Self Care Goal Status ([id number]) CI  [facility], OTR/L  [phone] 10/27/2014

## 2014-11-18 ENCOUNTER — Ambulatory Visit: Payer: Self-pay | Attending: Family Medicine | Admitting: Family Medicine

## 2014-11-18 ENCOUNTER — Encounter: Payer: Self-pay | Admitting: Family Medicine

## 2014-11-18 VITALS — BP 151/86 | HR 131 | Temp 98.1°F | Resp 16 | Ht 67.0 in | Wt 186.0 lb

## 2014-11-18 DIAGNOSIS — R079 Chest pain, unspecified: Secondary | ICD-10-CM | POA: Insufficient documentation

## 2014-11-18 DIAGNOSIS — I10 Essential (primary) hypertension: Secondary | ICD-10-CM | POA: Insufficient documentation

## 2014-11-18 DIAGNOSIS — M10032 Idiopathic gout, left wrist: Secondary | ICD-10-CM

## 2014-11-18 DIAGNOSIS — M109 Gout, unspecified: Secondary | ICD-10-CM | POA: Insufficient documentation

## 2014-11-18 DIAGNOSIS — R0602 Shortness of breath: Secondary | ICD-10-CM | POA: Insufficient documentation

## 2014-11-18 DIAGNOSIS — Z87891 Personal history of nicotine dependence: Secondary | ICD-10-CM | POA: Insufficient documentation

## 2014-11-18 DIAGNOSIS — M25532 Pain in left wrist: Secondary | ICD-10-CM | POA: Insufficient documentation

## 2014-11-18 MED ORDER — METHYLPREDNISOLONE 4 MG PO TBPK: ORAL_TABLET | ORAL | Status: DC

## 2014-11-18 MED ORDER — METOPROLOL SUCCINATE ER 50 MG PO TB24: 50.0000 mg | ORAL_TABLET | Freq: Every day | ORAL | Status: DC

## 2014-11-18 NOTE — Patient Instructions (Addendum)
Mr. Cortright,  Thank you for coming in today.   1. Gout: Depo medrol pak ordered Restart allopurinol once pain is very minimal and swelling is very minimal for at least 3 days Ice and rest wrist  2. HTN: Goal < 140/90 Continue norvasc 10 mg daily Add metoprolol 50 mg daily   F/u in 4 weeks with RN for BP and pulse check  F/u with me in 8 weeks for gout and HTN  Dr. Adrian Blackwater

## 2014-11-18 NOTE — Progress Notes (Signed)
HFU Chest pain Complain of lt wrist hand.

## 2014-11-18 NOTE — Progress Notes (Signed)
   Subjective:    Patient ID: [name] [name], male    DOB: [birth year], 54 y.o.   MRN: [id number] CC: HFU chest pain, L wrist pain  HPI 54 yo M presents for f/u visit:  1. Chest pain and SOB: resolved. Patient hospitalized. He has quit smoking and ETOH. Using advair and albuterol prn. Notes SOB with exertion at times.  2. L wrist pain: has known gout. Swelling improved with recent prednisone course. No ETOH. Started taking allopurinol but stopped when swelling and redness worsened. No recent injury. Wearing wrist splint.   3. HTN: taking norvasc and losartan. Took BP medicine this AM. Has intermittent CP and SOB. No headache, change in weight or vision changes.   Social History  Substance Use Topics  . Smoking status: Former Smoker -- 1.00 packs/day for 20 years    Quit date: 12/15/2013  . Smokeless tobacco: Never Used     Comment: 'Quitting"  . Alcohol Use: Yes     Comment: occaisional beer   Review of Systems  Constitutional: Negative for fever, chills, fatigue and unexpected weight change.  Eyes: Negative for visual disturbance.  Respiratory: Positive for shortness of breath. Negative for cough.   Cardiovascular: Negative for chest pain, palpitations and leg swelling.  Gastrointestinal: Negative for nausea, vomiting, abdominal pain, diarrhea, constipation and blood in stool.  Endocrine: Negative for polydipsia, polyphagia and polyuria.  Musculoskeletal: Positive for myalgias, joint swelling and arthralgias. Negative for back pain, gait problem and neck pain.  Skin: Negative for rash.  Allergic/Immunologic: Negative for immunocompromised state.  Neurological: Positive for numbness.  Hematological: Negative for adenopathy. Does not bruise/bleed easily.  Psychiatric/Behavioral: Negative for suicidal ideas, sleep disturbance and dysphoric mood. The patient is not nervous/anxious.       Objective:   Physical Exam  Constitutional: He appears well-developed and well-nourished. No  distress.  HENT:  Head: Normocephalic and atraumatic.  Neck: Normal range of motion. Neck supple.  Cardiovascular: Normal rate, regular rhythm, normal heart sounds and intact distal pulses.   Pulmonary/Chest: Effort normal and breath sounds normal.  Musculoskeletal: He exhibits edema and tenderness.       Arms: Neurological: He is alert.  Skin: Skin is warm and dry. No rash noted. No erythema.  Psychiatric: He has a normal mood and affect.  BP 145/86 mmHg  Pulse 68  Temp(Src) 98.1 F (36.7 C) (Oral)  Resp 16  Ht 5\' 7"  (1.702 m)  Wt 186 lb (84.369 kg)  BMI 29.12 kg/m2  SpO2 94%  Wt Readings from Last 3 Encounters:  11/18/14 186 lb (84.369 kg)  10/30/14 180 lb (81.647 kg)  10/28/14 183 lb 11.2 oz (83.326 kg)   BP Readings from Last 3 Encounters:  11/18/14 145/86  10/30/14 135/87  10/28/14 141/80    Lab Results  Component Value Date   CREATININE 1.34* 10/26/2014   CREATININE 1.24 10/13/2014   CREATININE 1.48* 08/20/2014       Assessment & Plan:

## 2014-11-19 NOTE — Assessment & Plan Note (Signed)
HTN: Goal < 140/90 Continue norvasc 10 mg daily Add metoprolol 50 mg daily

## 2014-11-19 NOTE — Assessment & Plan Note (Signed)
Gout: Depo medrol pak ordered Restart allopurinol once pain is very minimal and swelling is very minimal for at least 3 days Ice and rest wrist

## 2014-11-19 NOTE — Assessment & Plan Note (Signed)
A: chest pain has resolved. BP elevated. Still smoking intermittently P: Smoking cessation counseling Adding BB for improved BP control

## 2014-12-01 ENCOUNTER — Other Ambulatory Visit: Payer: Self-pay | Admitting: *Deleted

## 2014-12-01 ENCOUNTER — Telehealth: Payer: Self-pay | Admitting: Family Medicine

## 2014-12-01 MED ORDER — AMLODIPINE BESYLATE 10 MG PO TABS: 10.0000 mg | ORAL_TABLET | Freq: Every day | ORAL | Status: DC

## 2014-12-01 NOTE — Telephone Encounter (Signed)
Rx Norvasc refill send to [facility] number

## 2014-12-01 NOTE — Telephone Encounter (Signed)
Patient requested a medication refill for amLODipine (NORVASC) and  atorvastatin (LIPITOR). Please follow up.

## 2014-12-11 ENCOUNTER — Other Ambulatory Visit: Payer: Self-pay | Admitting: Family Medicine

## 2014-12-11 MED ORDER — AMLODIPINE BESYLATE 10 MG PO TABS: 10.0000 mg | ORAL_TABLET | Freq: Every day | ORAL | Status: DC

## 2014-12-16 ENCOUNTER — Ambulatory Visit: Payer: Medicaid Other | Attending: Family Medicine | Admitting: Pharmacist

## 2014-12-16 DIAGNOSIS — Z87891 Personal history of nicotine dependence: Secondary | ICD-10-CM | POA: Diagnosis not present

## 2014-12-16 DIAGNOSIS — I10 Essential (primary) hypertension: Secondary | ICD-10-CM | POA: Insufficient documentation

## 2014-12-16 NOTE — Progress Notes (Signed)
S:    Patient arrives in good spirits.  He presents to the clinic for hypertension evaluation.     Patient reports adherence with medications.  Current BP Medications include:  Losartan 50 mg and metoprolol succinate 50 mg daily  Patient reports that he recently had a cold and is starting to get better. This cold made his breathing worse requiring him to use albuterol about twice a day. He reports that he is awaiting the drug company (patient assistance program) to mail him his Spiriva. He could not afford it when it was first prescribed but now will be able to get it for free. He expects it to be shipped to him home in the next few weeks.   He reports that his wrist is feeling much better. He has a brace on it. He reports that the swelling is down but that he still has some pain. He is planning on following up with Dr. Adrian Blackwater in the next few weeks.  O:   Last 3 Office BP readings: BP Readings from Last 3 Encounters:  11/18/14 151/86  10/30/14 135/87  10/28/14 141/80    BMET    Component Value Date/Time   NA 135 10/26/2014 0110   K 4.4 10/26/2014 0110   CL 99* 10/26/2014 0110   CO2 26 10/26/2014 0110   GLUCOSE 73 10/26/2014 0110   BUN 17 10/26/2014 0110   CREATININE 1.34* 10/26/2014 0110   CREATININE 1.24 10/13/2014 1108   CALCIUM 9.2 10/26/2014 0110   GFRNONAA 58* 10/26/2014 0110   GFRAA >60 10/26/2014 0110    A/P:  Hypertension: currently is controlled on losartan 50 mg daily and metoprolol succinate 50 mg daily. Pulse is also controlled. No recommendations for any changes in medication.  COPD: currently uncontrolled due to use of albuterol about twice daily. He also is recovering from a cold which made his breathing worse - he reports that he is feeling better now. Patient is awaiting for patient assistance program to send him Spiriva. Patient to follow up with Dr. Adrian Blackwater - I suspect his breathing will greatly improve once he has Spiriva. I instructed him to call us  immediately if his breathing gets worse.  Gout: patient reports that he has completed his Medrol dose pack and that his wrist is getting much better. He reports that he still has some pain. Patient to follow up with Dr. Adrian Blackwater in a few weeks.  Atorvastatin allergy: patient went to the ED for swelling after taking atorvastatin. He is currently not taking it. Unsure if they were able to directly link that to swelling but removed it from his medication list as he is no longer taking it.   Medication reconciliation completed. Results reviewed and written information provided.   F/U Clinic Visit with Dr. Adrian Blackwater. Total time in face-to-face counseling 20 minutes.    Nicoletta Ba, PharmD, BCPS, [facility] [phone]

## 2014-12-16 NOTE — Patient Instructions (Signed)
Thank you for coming in to see me today!  Your blood pressure looks much better - keep taking all of your medications  Schedule an appointment to see Dr. Adrian Blackwater for follow up  [place] stands for "Dietary Approaches to Stop Hypertension." The DASH eating plan is a healthy eating plan that has been shown to reduce high blood pressure (hypertension). Additional health benefits may include reducing the risk of type 2 diabetes mellitus, heart disease, and stroke. The DASH eating plan may also help with weight loss. WHAT DO I NEED TO KNOW ABOUT THE DASH EATING PLAN? For the DASH eating plan, you will follow these general guidelines:  Choose foods with a percent daily value for sodium of less than 5% (as listed on the food label).  Use salt-free seasonings or herbs instead of table salt or sea salt.  Check with your health care provider or pharmacist before using salt substitutes.  Eat lower-sodium products, often labeled as "lower sodium" or "no salt added."  Eat fresh foods.  Eat more vegetables, fruits, and low-fat dairy products.  Choose whole grains. Look for the word "whole" as the first word in the ingredient list.  Choose fish and skinless chicken or [place] more often than red meat. Limit fish, poultry, and meat to 6 oz (170 g) each day.  Limit sweets, desserts, sugars, and sugary drinks.  Choose heart-healthy fats.  Limit cheese to 1 oz (28 g) per day.  Eat more home-cooked food and less restaurant, buffet, and fast food.  Limit fried foods.  Cook foods using methods other than frying.  Limit canned vegetables. If you do use them, rinse them well to decrease the sodium.  When eating at a restaurant, ask that your food be prepared with less salt, or no salt if possible. WHAT FOODS CAN I EAT? Seek help from a dietitian for individual calorie needs. Grains Whole grain or whole wheat bread. Brown rice. Whole grain or whole wheat pasta. Quinoa, bulgur, and  whole grain cereals. Low-sodium cereals. Corn or whole wheat flour tortillas. Whole grain cornbread. Whole grain crackers. Low-sodium crackers. Vegetables Fresh or frozen vegetables (raw, steamed, roasted, or grilled). Low-sodium or reduced-sodium tomato and vegetable juices. Low-sodium or reduced-sodium tomato sauce and paste. Low-sodium or reduced-sodium canned vegetables.  Fruits All fresh, canned (in natural juice), or frozen fruits. Meat and Other Protein Products Ground beef (85% or leaner), grass-fed beef, or beef trimmed of fat. Skinless chicken or [place]. Ground chicken or [place]. Pork trimmed of fat. All fish and seafood. Eggs. Dried beans, peas, or lentils. Unsalted nuts and seeds. Unsalted canned beans. Dairy Low-fat dairy products, such as skim or 1% milk, 2% or reduced-fat cheeses, low-fat ricotta or cottage cheese, or plain low-fat yogurt. Low-sodium or reduced-sodium cheeses. Fats and Oils Tub margarines without trans fats. Light or reduced-fat mayonnaise and salad dressings (reduced sodium). Avocado. Safflower, olive, or canola oils. Natural peanut or almond butter. Other Unsalted popcorn and pretzels. The items listed above may not be a complete list of recommended foods or beverages. Contact your dietitian for more options. WHAT FOODS ARE NOT RECOMMENDED? Grains White bread. White pasta. White rice. Refined cornbread. Bagels and croissants. Crackers that contain trans fat. Vegetables Creamed or fried vegetables. Vegetables in a cheese sauce. Regular canned vegetables. Regular canned tomato sauce and paste. Regular tomato and vegetable juices. Fruits Dried fruits. Canned fruit in light or heavy syrup. Fruit juice. Meat and Other Protein Products Fatty cuts of meat. Ribs, chicken wings, bacon,  sausage, bologna, salami, chitterlings, fatback, hot dogs, bratwurst, and packaged luncheon meats. Salted nuts and seeds. Canned beans with salt. Dairy Whole or 2% milk, cream,  half-and-half, and cream cheese. Whole-fat or sweetened yogurt. Full-fat cheeses or blue cheese. Nondairy creamers and whipped toppings. Processed cheese, cheese spreads, or cheese curds. Condiments Onion and garlic salt, seasoned salt, table salt, and sea salt. Canned and packaged gravies. Worcestershire sauce. Tartar sauce. Barbecue sauce. Teriyaki sauce. Soy sauce, including reduced sodium. Steak sauce. Fish sauce. Oyster sauce. Cocktail sauce. Horseradish. Ketchup and mustard. Meat flavorings and tenderizers. Bouillon cubes. Hot sauce. Tabasco sauce. Marinades. Taco seasonings. Relishes. Fats and Oils Butter, stick margarine, lard, shortening, ghee, and bacon fat. Coconut, palm kernel, or palm oils. Regular salad dressings. Other Pickles and olives. Salted popcorn and pretzels. The items listed above may not be a complete list of foods and beverages to avoid. Contact your dietitian for more information. WHERE CAN I FIND MORE INFORMATION? National Heart, Lung, and Blood Institute: travelstabloid.com Document Released: 03/16/2011 Document Revised: 08/11/2013 Document Reviewed: 01/29/2013 [facility] Patient Information 2015 [place], [place]. This information is not intended to replace advice given to you by your health care provider. Make sure you discuss any questions you have with your health care provider.

## 2015-01-21 ENCOUNTER — Encounter: Payer: Self-pay | Admitting: Family Medicine

## 2015-01-21 ENCOUNTER — Ambulatory Visit: Payer: Medicaid Other | Attending: Family Medicine | Admitting: Family Medicine

## 2015-01-21 VITALS — BP 132/83 | HR 84 | Temp 98.7°F | Resp 16 | Ht 67.0 in | Wt 198.0 lb

## 2015-01-21 DIAGNOSIS — M549 Dorsalgia, unspecified: Secondary | ICD-10-CM | POA: Insufficient documentation

## 2015-01-21 DIAGNOSIS — Z87891 Personal history of nicotine dependence: Secondary | ICD-10-CM | POA: Diagnosis not present

## 2015-01-21 DIAGNOSIS — Z79899 Other long term (current) drug therapy: Secondary | ICD-10-CM | POA: Diagnosis not present

## 2015-01-21 DIAGNOSIS — R200 Anesthesia of skin: Secondary | ICD-10-CM | POA: Insufficient documentation

## 2015-01-21 DIAGNOSIS — R208 Other disturbances of skin sensation: Secondary | ICD-10-CM

## 2015-01-21 MED ORDER — GABAPENTIN 300 MG PO CAPS: 300.0000 mg | ORAL_CAPSULE | Freq: Every day | ORAL | Status: DC

## 2015-01-21 NOTE — Patient Instructions (Signed)
[name] [name] was seen today for joint swelling.  Diagnoses and all orders for this visit:  Numbness of left hand -     gabapentin (NEURONTIN) 300 MG capsule; Take 1 capsule (300 mg total) by mouth at bedtime. -     Ambulatory referral to Neurology   Restart allopurinol  Take with   F/u in 6 weeks for wrist pain  Dr. Adrian Blackwater

## 2015-01-21 NOTE — Progress Notes (Signed)
Patient ID: [name] [name], male   DOB: 20-Sep-1960, 54 y.o.   MRN: [id number]   Subjective:  Patient ID: [name] [name], male    DOB: Aug 05, 1960  Age: 54 y.o. MRN: [id number]  CC: Back Pain   HPI [name] [name] presents for    1. L wrist: radial wrist swelling that comes and goes. Has been persistent now for a few weeks. Patient also has numbness in most of his fingers and palm of his hand. He is not working. He denies ETOH and drug use. Continues not to smoking. No joint injury or redness. Is still hold allopurinol as I asked him not to take it in case he was having a gout flare.   Social History  Substance Use Topics  . Smoking status: Former Smoker -- 1.00 packs/day for 20 years    Quit date: 12/15/2013  . Smokeless tobacco: Never Used     Comment: 'Quitting"  . Alcohol Use: Yes     Comment: occaisional beer    Outpatient Prescriptions Prior to Visit  Medication Sig Dispense Refill  . albuterol (PROVENTIL HFA;VENTOLIN HFA) 108 (90 BASE) MCG/ACT inhaler Inhale 2 puffs into the lungs every 4 (four) hours as needed for wheezing or shortness of breath. 18 g 11  . amLODipine (NORVASC) 10 MG tablet Take 1 tablet (10 mg total) by mouth daily. 30 tablet 5  . diphenhydrAMINE (BENADRYL) 25 MG tablet Take 50 mg by mouth once as needed for itching or allergies.    . Fluticasone-Salmeterol (ADVAIR) 100-50 MCG/DOSE AEPB Inhale 1 puff into the lungs 2 (two) times daily. 180 each 11  . losartan (COZAAR) 50 MG tablet Take 1 tablet (50 mg total) by mouth daily. 90 tablet 3  . metoprolol succinate (TOPROL-XL) 50 MG 24 hr tablet Take 1 tablet (50 mg total) by mouth daily. Take with or immediately following a meal. 30 tablet 3  . allopurinol (ZYLOPRIM) 100 MG tablet Take 1 tablet (100 mg total) by mouth daily. (Patient not taking: Reported on 01/21/2015) 30 tablet 6   No facility-administered medications prior to visit.    ROS Review of Systems  Constitutional: Negative for fever, chills, fatigue and  unexpected weight change.  Eyes: Negative for visual disturbance.  Respiratory: Negative for cough and shortness of breath.   Cardiovascular: Negative for chest pain, palpitations and leg swelling.  Gastrointestinal: Negative for nausea, vomiting, abdominal pain, diarrhea, constipation and blood in stool.  Endocrine: Negative for polydipsia, polyphagia and polyuria.  Musculoskeletal: Positive for joint swelling and arthralgias. Negative for myalgias, back pain, gait problem and neck pain.  Skin: Positive for rash (palms ).  Allergic/Immunologic: Negative for immunocompromised state.  Hematological: Negative for adenopathy. Does not bruise/bleed easily.  Psychiatric/Behavioral: Negative for suicidal ideas, sleep disturbance and dysphoric mood. The patient is not nervous/anxious.     Objective:  BP 132/83 mmHg  Pulse 84  Temp(Src) 98.7 F (37.1 C) (Oral)  Resp 16  Ht 5\' 7"  (1.702 m)  Wt 198 lb (89.812 kg)  BMI 31.00 kg/m2  SpO2 90%  BP/Weight 01/21/2015 11/18/2014 8/86/7737  Systolic BP 366 815 947  Diastolic BP 83 86 87  Wt. (Lbs) 198 186 180  BMI 31 29.12 27.38   Physical Exam  Constitutional: He appears well-developed and well-nourished. No distress.  HENT:  Head: Normocephalic and atraumatic.  Neck: Normal range of motion. Neck supple.  Cardiovascular: Normal rate, regular rhythm, normal heart sounds and intact distal pulses.   Pulmonary/Chest: Effort normal and breath sounds normal.  Musculoskeletal:  He exhibits no edema or tenderness.       Left wrist: He exhibits swelling. He exhibits normal range of motion and no tenderness.       Arms: Neurological: He is alert.  Skin: Skin is warm and dry. No rash noted. No erythema.  Psychiatric: He has a normal mood and affect.    Last uric acid 8.1   Chemistry      Component Value Date/Time   NA 135 10/26/2014 0110   K 4.4 10/26/2014 0110   CL 99* 10/26/2014 0110   CO2 26 10/26/2014 0110   BUN 17 10/26/2014 0110    CREATININE 1.34* 10/26/2014 0110   CREATININE 1.24 10/13/2014 1108      Component Value Date/Time   CALCIUM 9.2 10/26/2014 0110   ALKPHOS 88 12/11/2013 0800   AST 24 12/11/2013 0800   ALT 28 12/11/2013 0800   BILITOT 0.4 12/11/2013 0800      Assessment & Plan:   Problem List Items Addressed This Visit    Numbness of left hand - Primary    A: numbness of L hand suspect carpal tunnel P:  Neurology referral Pt referral  neurontin for pain       Relevant Medications   gabapentin (NEURONTIN) 300 MG capsule   Other Relevant Orders   Ambulatory referral to Neurology   Ambulatory referral to Physical Therapy      No orders of the defined types were placed in this encounter.    Follow-up: No Follow-up on file.   Boykin Nearing MD

## 2015-01-21 NOTE — Assessment & Plan Note (Signed)
A: numbness of L hand suspect carpal tunnel P:  Neurology referral Pt referral  neurontin for pain

## 2015-01-21 NOTE — Progress Notes (Signed)
F/U lt wrist pain  Wrist numbness, swelling and hard  Unable to closed fist, no movement on lt tomb Pain scale # 8 No hx tobacco.

## 2015-02-22 ENCOUNTER — Ambulatory Visit (INDEPENDENT_AMBULATORY_CARE_PROVIDER_SITE_OTHER): Payer: Medicaid Other | Admitting: Neurology

## 2015-02-22 ENCOUNTER — Encounter: Payer: Self-pay | Admitting: Neurology

## 2015-02-22 VITALS — BP 160/104 | HR 82 | Ht 67.0 in | Wt 202.4 lb

## 2015-02-22 DIAGNOSIS — R202 Paresthesia of skin: Secondary | ICD-10-CM | POA: Diagnosis not present

## 2015-02-22 DIAGNOSIS — M6289 Other specified disorders of muscle: Secondary | ICD-10-CM | POA: Diagnosis not present

## 2015-02-22 DIAGNOSIS — R29898 Other symptoms and signs involving the musculoskeletal system: Secondary | ICD-10-CM

## 2015-02-22 NOTE — Progress Notes (Signed)
[facility] Clinic Note - Initial Visit   Date: 02/22/2015  [name] [name] MRN: [id number] DOB: 12/13/1960   Dear Dr. Adrian Blackwater:  Thank you for your kind referral of [name] [name] for consultation of left wrist pain. Although his history is well known to you, please allow [place] to reiterate it for the purpose of our medical record. The patient was accompanied to the clinic by self.    History of Present Illness: [name] [name] is a 54 y.o. right-handed African American male with pulmonary hypertension, gout, CODP on home oxygen presenting for evaluation of left hand paresthesias.    Since August 2016, he had a prolonged flare of gout which was severe.  He says that the entire hand became swollen for two months.  He was treated with allopurinol and prednisone and since the swelling improved, he feels as if there is a knot at the base of the thumb and he has difficulty extending his thumb.  He also has tingling of all the fingers on the left.  He has been using a wrist splint which helps with the pain, but no difference with tingling.  He denies any neck pain or similar symptoms on the right.  Out-side paper records, electronic medical record, and images have been reviewed where available and summarized as:  XR left thumb 06/12/2009:  Fracture involving the base of the distal phalanx.  Lab Results  Component Value Date   VITAMINB12 131* 06/30/2010   Lab Results  Component Value Date   HGBA1C 5.2 10/26/2014    Past Medical History  Diagnosis Date  . Gout   . Thrombocytopenia ([place]) 07/28/2012  . Tobacco abuse 07/29/2012  . Influenza with respiratory manifestations 07/30/2012  . Asthma 1999  . 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 COPD (chronic obstructive pulmonary disease) ([place]) 12/15/2013  . Essential hypertension   . Pneumonia     Past Surgical History  Procedure Laterality Date  . Knee surgery      right knee s/p trauma  . Fracture surgery   childhood    right arm     Medications:  Outpatient Encounter Prescriptions as of 02/22/2015  Medication Sig Note  . albuterol (PROVENTIL HFA;VENTOLIN HFA) 108 (90 BASE) MCG/ACT inhaler Inhale 2 puffs into the lungs every 4 (four) hours as needed for wheezing or shortness of breath.   . allopurinol (ZYLOPRIM) 100 MG tablet Take 1 tablet (100 mg total) by mouth daily. 10/30/2014: Was instructed not to start until the swelling went down  . amLODipine (NORVASC) 10 MG tablet Take 1 tablet (10 mg total) by mouth daily.   . diphenhydrAMINE (BENADRYL) 25 MG tablet Take 50 mg by mouth once as needed for itching or allergies.   . Fluticasone-Salmeterol (ADVAIR) 100-50 MCG/DOSE AEPB Inhale 1 puff into the lungs 2 (two) times daily.   Marland Kitchen gabapentin (NEURONTIN) 300 MG capsule Take 1 capsule (300 mg total) by mouth at bedtime.   Marland Kitchen losartan (COZAAR) 50 MG tablet Take 1 tablet (50 mg total) by mouth daily.   . metoprolol succinate (TOPROL-XL) 50 MG 24 hr tablet Take 1 tablet (50 mg total) by mouth daily. Take with or immediately following a meal.   . tiotropium (SPIRIVA) 18 MCG inhalation capsule Place 18 mcg into inhaler and inhale daily.    No facility-administered encounter medications on file as of 02/22/2015.     Allergies:  Allergies  Allergen Reactions  . Atorvastatin Itching and Swelling    Facial, tongue swelling  .  Shellfish Allergy Anaphylaxis  . Colchicine Hives    Family History: Family History  Problem Relation Age of Onset  . Colon cancer      mother ? age of diagnosis   . Cancer Mother     colon   . Heart disease Father     Social History: Social History  Substance Use Topics  . Smoking status: Former Smoker -- 1.00 packs/day for 20 years    Quit date: 12/15/2013  . Smokeless tobacco: Never Used     Comment: 'Quitting"  . Alcohol Use: 0.0 oz/week    0 Standard drinks or equivalent per week     Comment: occaisional beer   Social History   Social History Narrative     Lives with daughter and grandkids x 2. 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    Smokes 1ppd since age 72 y.o    Drinks 2 times per week 40 oz beer. Denies liquor, wine. History of cocaine use. Denies IVDU   Has an orange card    Sexually active with 1 partner uses protection                 Review of Systems:  CONSTITUTIONAL: No fevers, chills, night sweats, or weight loss.   EYES: No visual changes or eye pain ENT: No hearing changes.  No history of nose bleeds.   RESPIRATORY: No cough, wheezing +shortness of breath.   CARDIOVASCULAR: Negative for chest pain, and palpitations.   GI: Negative for abdominal discomfort, blood in stools or black stools.  No recent change in bowel habits.   GU:  No history of incontinence.   MUSCLOSKELETAL: +history of joint pain or swelling.  No myalgias.   SKIN: Negative for lesions, rash, and itching.   HEMATOLOGY/ONCOLOGY: Negative for prolonged bleeding, bruising easily, and swollen nodes.  No history of cancer.   ENDOCRINE: Negative for cold or heat intolerance, polydipsia or goiter.   PSYCH:  No depression or anxiety symptoms.   NEURO: As Above.   Vital Signs:  BP 160/104 mmHg  Pulse 82  Ht 5\' 7"  (1.702 m)  Wt 202 lb 6 oz (91.797 kg)  BMI 31.69 kg/m2  SpO2 88%   General Medical Exam:   General:  Well appearing, poor personal hygiene, comfortable.   Extremities:  ?tophus over the left base of the thumb.    Skin:  Marked darkly pigmented grey-black macular rash over the hands, palm, and feet   Neurological Exam: MENTAL STATUS including orientation to time, place, person, recent and remote memory, attention span and concentration, language, and fund of knowledge is normal.  Speech is not dysarthric.  CRANIAL NERVES: II:  No visual field defects.  Unremarkable fundi.   III-IV-VI: Pupils equal round and reactive to light.  Normal conjugate, extra-ocular eye movements  in all directions of gaze.  No nystagmus.  No ptosis V:  Normal facial sensation.   VII:  Normal facial symmetry and movements.  No pathologic facial reflexes.  VIII:  Normal hearing and vestibular function.   IX-X:  Normal palatal movement.   XI:  Normal shoulder shrug and head rotation.   XII:  Normal tongue strength and range of motion, no deviation or fasciculation.  MOTOR:  No atrophy, fasciculations or abnormal movements.  No pronator drift.  Tone is normal.    Right Upper Extremity:    Left Upper Extremity:    Deltoid  5/5   Deltoid  5/5   Biceps  5/5   Biceps  5/5   Triceps  5/5   Triceps  5/5   Wrist extensors  5/5   Wrist extensors  5/5   Wrist flexors  5/5   Wrist flexors  5/5   Finger extensors  5/5   Finger extensors  4/5   Finger flexors  5/5   Finger flexors  5/5   Dorsal interossei  5/5   Dorsal interossei  4/5   Abductor pollicis  5/5   Abductor pollicis  4/5   Tone (Ashworth scale)  0  Tone (Ashworth scale)  0   Right Lower Extremity:    Left Lower Extremity:    Hip flexors  5/5   Hip flexors  5/5   Hip extensors  5/5   Hip extensors  5/5   Knee flexors  5/5   Knee flexors  5/5   Knee extensors  5/5   Knee extensors  5/5   Dorsiflexors  5/5   Dorsiflexors  5/5   Plantarflexors  5/5   Plantarflexors  5/5   Toe extensors  5/5   Toe extensors  5/5   Toe flexors  5/5   Toe flexors  5/5   Tone (Ashworth scale)  0  Tone (Ashworth scale)  0   MSRs:  Right                                                                 Left brachioradialis 2+  brachioradialis 2+  biceps 2+  biceps 2+  triceps 2+  triceps 2+  patellar 2+  patellar 2+  ankle jerk 2+  ankle jerk 2+   SENSORY:  Normal and symmetric perception of light touch, pinprick, vibration, and proprioception.    COORDINATION/GAIT: Gait narrow based and stable.   IMPRESSION: 1.  Left hand paresthesias and weakness  - Weakness involving finger extensors and abductors, concerning for C8 nerve root involvement,  but he denies radicular symptoms  - Proceed with NCS/EMG of the left hand  2.  History of vitamin B12 deficiency  - Labs for vitamin B12 declined by patient due to needle phobia  3.  Gout, treated with prednisone and allupurinol  4.  Skin rash ?gout related, f/u with PCP  Further recommendations based on the results of above testing   The duration of this appointment visit was 30 minutes of face-to-face time with the patient.  Greater than 50% of this time was spent in counseling, explanation of diagnosis, planning of further management, and coordination of care.   Thank you for allowing me to participate in patient's care.  If I can answer any additional questions, I would be pleased to do so.    Sincerely,    Donika K. Posey Pronto, DO

## 2015-02-22 NOTE — Patient Instructions (Addendum)
NCS/EMG of the left upper extremity We will call you with the results

## 2015-03-09 ENCOUNTER — Ambulatory Visit (INDEPENDENT_AMBULATORY_CARE_PROVIDER_SITE_OTHER): Payer: Medicaid Other | Admitting: Neurology

## 2015-03-09 ENCOUNTER — Other Ambulatory Visit: Payer: Self-pay | Admitting: Family Medicine

## 2015-03-09 DIAGNOSIS — R202 Paresthesia of skin: Secondary | ICD-10-CM

## 2015-03-09 DIAGNOSIS — M6289 Other specified disorders of muscle: Secondary | ICD-10-CM | POA: Diagnosis not present

## 2015-03-09 DIAGNOSIS — R29898 Other symptoms and signs involving the musculoskeletal system: Secondary | ICD-10-CM

## 2015-03-09 DIAGNOSIS — G610 Guillain-Barre syndrome: Secondary | ICD-10-CM

## 2015-03-09 NOTE — Procedures (Signed)
[facility]  [address]  [place], [place] [address] Tel: [phone] Fax:  [phone] Test Date:  03/09/2015  Patient: [name] [name] DOB: 01/31/1961 Physician: Narda Amber, DO  Sex: Male Height: 5\' 7"  Ref Phys: Narda Amber, DO  ID#: [id number] Temp: 34.2C Technician: Jerilynn Mages. Dean   Patient Complaints: This is a 54 year old gentleman referred for evaluation of left hand weakness and paresthesias.  NCV & EMG Findings: Extensive electrodiagnostic testing of the left upper extremity and additional studies of the right shows: 1. Left median and bilateral ulnar sensory responses show reduced amplitude and prolonged latency. The right median sensory response is absent. Bilateral radial sensory responses are within normal limits. 2. Bilateral median motor responses are prolonged with preserved amplitude and conduction velocity. The left ulnar motor response shows reduced amplitude, prolonged latency, and conduction velocity slowing across the elbow. The right ulnar motor responses also shows conduction velocity slowing across the elbow, however motor latency and amplitude is within normal limits. 3. In the left upper extremity, there is active on chronic motor axon loss changes affecting the first dorsal interosseous, extensor indices proprius, abductor digiti minimi, and flexor carpi ulnaris muscles. 4. In the right upper extremity chronic motor axon loss changes are seen affecting the first dorsal interosseous and abductor digiti minimi muscles, without accompanied active denervation.  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  Alternatively, a polyradiculoneuropathy should be considered.  Clinical correlation recommended.    ___________________________ Narda Amber, DO    Nerve Conduction Studies Anti Sensory  Summary Table   Site NR Peak (ms) Norm Peak (ms) P-T Amp (V) Norm P-T Amp  Left Median Anti Sensory (2nd Digit)  Wrist    4.3 <3.6 6.5 >15  Right Median Anti Sensory (2nd Digit)  Wrist NR  <3.6  >15  Left Radial Anti Sensory (Base 1st Digit)  Wrist    2.1 <2.7 18.4 >14  Right Radial Anti Sensory (Base 1st Digit)  Wrist    2.5 <2.7 14.5 >14  Left Ulnar Anti Sensory (5th Digit)  Wrist    3.4 <3.1 2.6 >10  Right Ulnar Anti Sensory (5th Digit)  Wrist    3.5 <3.1 6.9 >10   Motor Summary Table   Site NR Onset (ms) Norm Onset (ms) O-P Amp (mV) Norm O-P Amp Site1 Site2 Delta-0 (ms) Dist (cm) Vel (m/s) Norm Vel (m/s)  Left Median Motor (Abd Poll Brev)  Wrist    4.7 <4.0 6.2 >6 Elbow Wrist 4.7 25.0 53 >50  Elbow    9.4  5.9         Right Median Motor (Abd Poll Brev)  Wrist    5.9 <4.0 6.4 >6 Elbow Wrist 5.4 28.0 52 >50  Elbow    11.3  5.6         Left Ulnar Motor (Abd Dig Minimi)  Wrist    3.6 <3.1 6.7 >7 B Elbow Wrist 4.8 24.0 50 >50  B Elbow    8.4  6.5  A Elbow B Elbow 2.4 10.0 42 >50  A Elbow    10.8  6.1         Right Ulnar Motor (Abd Dig Minimi)  Wrist    2.7 <3.1 8.0 >7 B Elbow Wrist 4.9 25.0 51 >50  B Elbow    7.6  7.6  A Elbow B Elbow  3.0 10.0 33 >50  A Elbow    10.6  6.9          EMG   Side Muscle Ins Act Fibs Psw Fasc Number Recrt Dur Dur. Amp Amp. Poly Poly. Comment  Left 1stDorInt Nml 2+ Nml Nml 3- Rapid All 1+ All 1+ All 1+ ATR  Left Abd Poll Brev Nml Nml Nml Nml 1- Mod-R Few 1+ Few 1+ Nml Nml N/A  Left Ext Indicis Nml 1+ Nml Nml 2- Mod-R Many 1+ Many 1+ Some 1+ N/A  Left FlexPolLong Nml Nml Nml Nml Nml Nml Nml Nml Nml Nml Nml Nml N/A  Left ABD Dig Min Nml 1+ Nml Nml 3- Rapid All 1+ All 1+ Nml Nml N/A  Left PronatorTeres Nml Nml Nml Nml Nml Nml Nml Nml Nml Nml Nml Nml N/A  Left Biceps Nml Nml Nml Nml Nml Nml Nml Nml Nml Nml Nml Nml N/A  Left Triceps Nml Nml Nml Nml Nml Nml Nml Nml Nml Nml Nml Nml N/A  Left Deltoid Nml Nml Nml Nml Nml Nml Nml Nml Nml Nml Nml Nml N/A    Left FlexCarpiUln Nml 1+ Nml Nml 2- Rapid Some 1+ Some 1+ Nml Nml N/A  Left Cervical PSP Low Nml Nml Nml Nml Nml Nml Nml Nml Nml Nml Nml Nml N/A  Right 1stDorInt Nml Nml Nml Nml 1- Rapid Some 1+ Some 1+ Nml Nml N/A  Right Ext Indicis Nml Nml Nml Nml Nml Nml Nml Nml Nml Nml Nml Nml N/A  Right Abd Poll Brev Nml Nml Nml Nml Nml Nml Nml Nml Nml Nml Nml Nml N/A  Right PronatorTeres Nml Nml Nml Nml Nml Nml Nml Nml Nml Nml Nml Nml N/A  Right ABD Dig Min Nml Nml Nml Nml 1- Rapid Some 1+ Some 1+ Nml Nml N/A  Right Triceps Nml Nml Nml Nml Nml Nml Nml Nml Nml Nml Nml Nml N/A     Waveforms:

## 2015-03-12 ENCOUNTER — Telehealth: Payer: Self-pay | Admitting: Family Medicine

## 2015-03-12 ENCOUNTER — Other Ambulatory Visit: Payer: Self-pay | Admitting: *Deleted

## 2015-03-12 DIAGNOSIS — R29898 Other symptoms and signs involving the musculoskeletal system: Secondary | ICD-10-CM

## 2015-03-12 DIAGNOSIS — G610 Guillain-Barre syndrome: Secondary | ICD-10-CM

## 2015-03-12 DIAGNOSIS — R202 Paresthesia of skin: Secondary | ICD-10-CM

## 2015-03-12 NOTE — Telephone Encounter (Signed)
Pt. Called requesting a refill on one of his medications. Pt. Does not know the name of the medication. Please f/u with pt.

## 2015-03-15 NOTE — Telephone Encounter (Signed)
Patient called and requested a med refill for a medication that has expired. Patient does not know the name. Please f/u

## 2015-03-15 NOTE — Telephone Encounter (Signed)
Pt notified Rx ready at [facility]

## 2015-03-16 ENCOUNTER — Other Ambulatory Visit: Payer: Self-pay | Admitting: Family Medicine

## 2015-03-16 DIAGNOSIS — IMO0001 Reserved for inherently not codable concepts without codable children: Secondary | ICD-10-CM

## 2015-03-16 MED ORDER — FLUTICASONE-SALMETEROL 250-50 MCG/DOSE IN AEPB
1.0000 | INHALATION_SPRAY | Freq: Two times a day (BID) | RESPIRATORY_TRACT | Status: DC
Start: 2015-03-16 — End: 2015-04-22

## 2015-03-16 NOTE — Assessment & Plan Note (Signed)
A: worsening COPD unable to obtain spiriva due to lack of insurance P: increase advair dose Pulmonology referral placed again

## 2015-03-29 ENCOUNTER — Other Ambulatory Visit: Payer: Self-pay | Admitting: Family Medicine

## 2015-04-07 ENCOUNTER — Other Ambulatory Visit: Payer: Self-pay | Admitting: Neurology

## 2015-04-07 ENCOUNTER — Other Ambulatory Visit (HOSPITAL_COMMUNITY)
Admission: RE | Admit: 2015-04-07 | Discharge: 2015-04-07 | Disposition: A | Discharge status: Home / Self Care | Payer: Medicaid Other | Source: Ambulatory Visit | Attending: Neurology | Admitting: Neurology

## 2015-04-07 ENCOUNTER — Ambulatory Visit
Admission: RE | Admit: 2015-04-07 | Discharge: 2015-04-07 | Disposition: A | Discharge status: Home / Self Care | Payer: Medicaid Other | Source: Ambulatory Visit | Attending: Neurology | Admitting: Neurology

## 2015-04-07 DIAGNOSIS — R531 Weakness: Secondary | ICD-10-CM | POA: Diagnosis not present

## 2015-04-07 DIAGNOSIS — R202 Paresthesia of skin: Secondary | ICD-10-CM | POA: Diagnosis not present

## 2015-04-07 DIAGNOSIS — R29898 Other symptoms and signs involving the musculoskeletal system: Secondary | ICD-10-CM

## 2015-04-07 DIAGNOSIS — G610 Guillain-Barre syndrome: Secondary | ICD-10-CM | POA: Insufficient documentation

## 2015-04-07 LAB — CSF CELL COUNT WITH DIFFERENTIAL
Eosinophils, CSF: 0 % (ref 0–1)
LYMPHS CSF: 78 % (ref 40–80)
Monocyte/Macrophage: 20 % (ref 15–45)
RBC COUNT CSF: 1 uL — AB
SEGMENTED NEUTROPHILS-CSF: 2 % (ref 0–6)
TUBE #: 3
WBC CSF: 2 uL (ref 0–5)

## 2015-04-07 LAB — GLUCOSE, CSF: Glucose, CSF: 65 mg/dL (ref 43–76)

## 2015-04-07 LAB — PROTEIN, CSF: Total Protein, CSF: 34 mg/dL (ref 15–45)

## 2015-04-07 NOTE — Discharge Instructions (Signed)

## 2015-04-07 NOTE — Progress Notes (Signed)
Discharge instructions explained to pt. 

## 2015-04-07 NOTE — Progress Notes (Signed)
1 SST tube drawn from left AC. Site is unremarkable and pt tolerated procedure well. 

## 2015-04-10 LAB — CSF CULTURE
GRAM STAIN: NONE SEEN
ORGANISM ID, BACTERIA: NO GROWTH

## 2015-04-10 LAB — CSF CULTURE W GRAM STAIN

## 2015-04-11 LAB — CRYPTOCOCCAL AG, LTX SCR RFLX TITER: Cryptococcal Ag Screen: NOT DETECTED

## 2015-04-11 LAB — ANGIOTENSIN CONVERTING ENZYME, CSF: ACE, CSF: 10 U/L (ref <=15)

## 2015-04-12 ENCOUNTER — Other Ambulatory Visit: Payer: Self-pay | Admitting: Family Medicine

## 2015-04-12 LAB — CNS IGG SYNTHESIS RATE, CSF+BLOOD
Albumin, CSF: 16.5 mg/dL (ref 8.0–42.0)
Albumin, Serum(Neph): 4.3 g/dL (ref 3.5–4.9)
IgG Index, CSF: 0.49 (ref <0.66)
IgG, CSF: 3.5 mg/dL (ref 0.8–7.7)
IgG, Serum: 1870 mg/dL — ABNORMAL HIGH (ref 694–1618)
MS CNS IGG SYNTHESIS RATE: -5.8 mg/(24.h) (ref <=3.3)

## 2015-04-14 ENCOUNTER — Other Ambulatory Visit: Payer: Self-pay

## 2015-04-14 DIAGNOSIS — IMO0001 Reserved for inherently not codable concepts without codable children: Secondary | ICD-10-CM

## 2015-04-14 DIAGNOSIS — J410 Simple chronic bronchitis: Secondary | ICD-10-CM

## 2015-04-14 MED ORDER — ALBUTEROL SULFATE HFA 108 (90 BASE) MCG/ACT IN AERS: 2.0000 | INHALATION_SPRAY | RESPIRATORY_TRACT | Status: DC | PRN

## 2015-04-14 MED FILL — METOPROLOL SUCC ER 50 MG TA: 50 | 30 days supply | Qty: 30 | Fill #0

## 2015-04-14 MED FILL — LOSARTAN POTASSIUM 50 MG TA: 50 | 30 days supply | Qty: 30 | Fill #1

## 2015-04-14 MED FILL — SPIRIVA 18 MCG CP-HANDIHALE: 18 | 30 days supply | Qty: 30 | Fill #1

## 2015-04-15 LAB — OLIGOCLONAL BANDS, CSF + SERM

## 2015-04-15 LAB — MYELIN BASIC PROTEIN, CSF

## 2015-04-20 ENCOUNTER — Other Ambulatory Visit: Payer: Self-pay | Admitting: *Deleted

## 2015-04-20 DIAGNOSIS — J410 Simple chronic bronchitis: Secondary | ICD-10-CM

## 2015-04-20 DIAGNOSIS — IMO0001 Reserved for inherently not codable concepts without codable children: Secondary | ICD-10-CM

## 2015-04-20 MED ORDER — ALBUTEROL SULFATE HFA 108 (90 BASE) MCG/ACT IN AERS: 2.0000 | INHALATION_SPRAY | RESPIRATORY_TRACT | Status: DC | PRN

## 2015-04-21 MED FILL — PROAIR HFA 90 MCG INHALER: 108 (90 BAS | 25 days supply | Qty: 9 | Fill #0

## 2015-04-22 ENCOUNTER — Ambulatory Visit (INDEPENDENT_AMBULATORY_CARE_PROVIDER_SITE_OTHER): Payer: Medicaid Other | Admitting: Pulmonary Disease

## 2015-04-22 ENCOUNTER — Encounter: Payer: Self-pay | Admitting: Pulmonary Disease

## 2015-04-22 VITALS — BP 152/108 | HR 87 | Ht 67.0 in | Wt 212.4 lb

## 2015-04-22 DIAGNOSIS — J449 Chronic obstructive pulmonary disease, unspecified: Secondary | ICD-10-CM

## 2015-04-22 DIAGNOSIS — G4733 Obstructive sleep apnea (adult) (pediatric): Secondary | ICD-10-CM | POA: Diagnosis not present

## 2015-04-22 DIAGNOSIS — IMO0001 Reserved for inherently not codable concepts without codable children: Secondary | ICD-10-CM

## 2015-04-22 NOTE — Progress Notes (Signed)
Subjective:    Patient ID: [name] [name], male    DOB: [birth year], 55 y.o.   MRN: [id number]  HPI Evaluation for COPD.  [name] [name] is a 55 year old with extensive smoking history. His main complaint is dyspnea on exertion for the past several years. He has chronic cough with mucus production. Denies any wheezing, hemoptysis. He was maintained initially on just albuterol rescue inhaler. He has been started on Spiriva a couple of weeks ago by his primary physician. He has not yet noticed if this difference in his symptoms.  He smoked about one half pack per day for nearly 40 years. He quit in November 2016.  DATA: Chest x-ray 10/26/14. Emphysema. No focal consolidation or pneumothorax.  Echo 10/27/14 Normal LV function; mild LVH and LVE; grade 1 diastolic dysfunction; moderate LAE; mild RAE; trace TR; severely elevated pulmonary pressure. PAP 72  CTA scan 12/13/13 1. No pulmonary embolism. Severe centrilobular emphysema. 2. Mild airway thickening favoring the lower lobes, possibly a manifestation of bronchitis or reactive airways disease. 3. Mild atelectasis in both lower lobes in the lingula. 4. Cardiomegaly, with particular right heart enlargement.  Past Medical History  Diagnosis Date  . Gout   . Thrombocytopenia ([place]) 07/28/2012  . Tobacco abuse 07/29/2012  . Influenza with respiratory manifestations 07/30/2012  . Asthma 1999  . 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 COPD (chronic obstructive pulmonary disease) ([place]) 12/15/2013  . Essential hypertension   . Pneumonia      Current outpatient prescriptions:  .  albuterol (PROVENTIL HFA;VENTOLIN HFA) 108 (90 Base) MCG/ACT inhaler, Inhale 2 puffs into the lungs every 4 (four) hours as needed for wheezing or shortness of breath., Disp: 54 g, Rfl: 3 .  allopurinol (ZYLOPRIM) 100 MG tablet, TAKE 1 TABLET BY MOUTH DAILY., Disp: 30 tablet, Rfl: 2 .  amLODipine (NORVASC) 10 MG tablet, Take 1 tablet (10 mg total) by mouth  daily., Disp: 30 tablet, Rfl: 5 .  diphenhydrAMINE (BENADRYL) 25 MG tablet, Take 50 mg by mouth once as needed for itching or allergies., Disp: , Rfl:  .  gabapentin (NEURONTIN) 300 MG capsule, Take 1 capsule (300 mg total) by mouth at bedtime., Disp: 30 capsule, Rfl: 3 .  losartan (COZAAR) 50 MG tablet, TAKE 1 TABLET BY MOUTH DAILY., Disp: 90 tablet, Rfl: 3 .  metoprolol succinate (TOPROL-XL) 50 MG 24 hr tablet, TAKE 1 TABLET BY MOUTH DAILY. TAKE WITH OR IMMEDIATELY FOLLOWING A MEAL., Disp: 30 tablet, Rfl: 2 .  SPIRIVA HANDIHALER 18 MCG inhalation capsule, Place 1 puff into inhaler and inhale daily., Disp: , Rfl: 4 .  [DISCONTINUED] ALBUTEROL IN, Inhale into the lungs., Disp: , Rfl:    Review of Systems Dyspnea on exertion, chronic cough, mucous production. No wheezing, hemoptysis. Has snoring, daytime sleepiness, fatigue. No chest pain, palpitation. No nausea, vomiting, diarrhea, constipation. No fevers, chills, loss of weight, loss of appetite, fatigue. All other review of systems are negative    Objective:   Physical Exam Blood pressure 152/108, pulse 87, height 5\' 7"  (1.702 m), weight 212 lb 6.4 oz (96.344 kg), SpO2 87 %. Gen: No apparent distress Neuro: No gross focal deficits. Neck: No JVD, lymphadenopathy, thyromegaly. RS: Clear, No wheeze, crackles CVS: S1-S2 heard, no murmurs rubs gallops. Abdomen: Soft, positive bowel sounds. Extremities: No edema.    Assessment & Plan:  #1 COPD [name] [name] likely has severe COPD based on his smoking exposure, findings on CT and chest x-ray. He has been recently started on Spiriva.  He'll continue the same along with albuterol rescue inhaler. He'll return to clinic in 3 months where we will decide if any additional therapy is indicated. I will get a set of lung function tests to estimate his severity of obstruction.  #2 Severe pulmonary hypertension. This may be secondary to his severe COPD. He also has symptoms of untreated OSA including  snoring, daytime sleepiness. We will try to optimally treat him for his COPD, evaluate for OSA with a sleep study. He will be followed with a repeat echocardiogram after these issues have been adequately addressed.  #3 Heavy ex-smoker. He is a candidate for low-dose screening CT. I will discuss with him at his next visit as he has to leave early today.  Plan: - Continue Spiriva, albuterol rescue inhaler. - Pulmonary function tests. - Schedule for sleep study.  Return to clinic in 3 months.  [name] MD [facility] Pager [phone] If no answer or after 3pm call: [phone] 04/22/2015, 2:47 PM

## 2015-04-22 NOTE — Patient Instructions (Signed)
We will schedule you for lung function tests and a sleep study to evaluate for sleep apnea. Continue using the Spiriva and albuterol rescue inhaler. Return to clinic in 3 months.

## 2015-04-27 MED FILL — ALLOPURINOL 100 MG TABLET: 100 | 30 days supply | Qty: 30 | Fill #0

## 2015-04-27 MED FILL — GABAPENTIN 300 MG CAPSULE: 300 | 30 days supply | Qty: 30 | Fill #3

## 2015-05-04 LAB — FUNGUS CULTURE W SMEAR: Smear Result: NONE SEEN

## 2015-05-07 ENCOUNTER — Ambulatory Visit (INDEPENDENT_AMBULATORY_CARE_PROVIDER_SITE_OTHER): Payer: Medicaid Other | Admitting: Neurology

## 2015-05-07 ENCOUNTER — Other Ambulatory Visit (INDEPENDENT_AMBULATORY_CARE_PROVIDER_SITE_OTHER): Payer: Medicaid Other

## 2015-05-07 ENCOUNTER — Telehealth: Payer: Self-pay | Admitting: *Deleted

## 2015-05-07 ENCOUNTER — Encounter: Payer: Self-pay | Admitting: Neurology

## 2015-05-07 VITALS — BP 150/100 | HR 87 | Wt 212.3 lb

## 2015-05-07 DIAGNOSIS — G5623 Lesion of ulnar nerve, bilateral upper limbs: Secondary | ICD-10-CM | POA: Diagnosis not present

## 2015-05-07 DIAGNOSIS — G5603 Carpal tunnel syndrome, bilateral upper limbs: Secondary | ICD-10-CM

## 2015-05-07 DIAGNOSIS — R29898 Other symptoms and signs involving the musculoskeletal system: Secondary | ICD-10-CM

## 2015-05-07 DIAGNOSIS — R202 Paresthesia of skin: Secondary | ICD-10-CM

## 2015-05-07 DIAGNOSIS — G610 Guillain-Barre syndrome: Secondary | ICD-10-CM

## 2015-05-07 LAB — VITAMIN B12: VITAMIN B 12: 121 pg/mL — AB (ref 211–911)

## 2015-05-07 LAB — TSH: TSH: 1.24 u[IU]/mL (ref 0.35–4.50)

## 2015-05-07 LAB — SEDIMENTATION RATE: Sed Rate: 3 mm/hr (ref 0–22)

## 2015-05-07 NOTE — Patient Instructions (Signed)
1.  Start using a soft elbow pad during the day to prevent compression of the nerve at the elbow 2.  At nighttime, use the elbow bad to prevent over flexion of the elbow 3.  Use wrist splints and avoid over flexing at the wrist 4.  Check blood work  Return to clinic 4 months

## 2015-05-07 NOTE — Progress Notes (Signed)
Follow-up Visit   Date: 05/07/2015    [name] [name] MRN: [id number] DOB: 17-Nov-1960   Interim History: [name] [name] is a 55 y.o. 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  The patient was accompanied to the clinic by self.  History of present illness: Since August 2016, he had a prolonged flare of gout which was severe. He says that the entire hand became swollen for two months. He was treated with allopurinol and prednisone and since the swelling improved, he feels as if there is a knot at the base of the thumb and he has difficulty extending his thumb. He also has tingling of all the fingers on the left. He has been using a wrist splint which helps with the pain, but no difference with tingling. He denies any neck pain or similar symptoms on the right.  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  Because of his NCS findings and possibility for polyradiculoneuropathy, he underwent CSF testing which returned normal.  He reports that sensation has started to return in his last two fingers on the left and attributes this to home exercises which he is doing.  Denies any worsening of weakness or paresthesias.   He endorses spending at least 6 hr a day with his elbows flexed watching TV on the side of the bed and sleeping with his arms flexed over his head.   Medications:  Current Outpatient Prescriptions on File Prior to Visit  Medication Sig Dispense Refill  . albuterol (PROVENTIL HFA;VENTOLIN HFA) 108 (90 Base) MCG/ACT inhaler Inhale 2 puffs into the lungs every 4 (four) hours as needed for wheezing or shortness of breath. 54 g 3  . allopurinol (ZYLOPRIM) 100 MG tablet TAKE 1 TABLET BY MOUTH DAILY. 30 tablet 2  . amLODipine  (NORVASC) 10 MG tablet Take 1 tablet (10 mg total) by mouth daily. 30 tablet 5  . diphenhydrAMINE (BENADRYL) 25 MG tablet Take 50 mg by mouth once as needed for itching or allergies.    Marland Kitchen gabapentin (NEURONTIN) 300 MG capsule Take 1 capsule (300 mg total) by mouth at bedtime. 30 capsule 3  . losartan (COZAAR) 50 MG tablet TAKE 1 TABLET BY MOUTH DAILY. 90 tablet 3  . metoprolol succinate (TOPROL-XL) 50 MG 24 hr tablet TAKE 1 TABLET BY MOUTH DAILY. TAKE WITH OR IMMEDIATELY FOLLOWING A MEAL. 30 tablet 2  . SPIRIVA HANDIHALER 18 MCG inhalation capsule Place 1 puff into inhaler and inhale daily.  4  . [DISCONTINUED] ALBUTEROL IN Inhale into the lungs.     No current facility-administered medications on file prior to visit.    Allergies:  Allergies  Allergen Reactions  . Atorvastatin Itching and Swelling    Facial, tongue swelling  . Shellfish Allergy Anaphylaxis  . Colchicine Hives    Review of Systems:  CONSTITUTIONAL: No fevers, chills, night sweats, or weight loss.  EYES: No visual changes or eye pain ENT: No hearing changes.  No history of nose bleeds.   RESPIRATORY: No cough, wheezing and shortness of breath.   CARDIOVASCULAR: Negative for chest pain, and palpitations.   GI: Negative for abdominal discomfort, blood in stools or black stools.  No recent change in bowel habits.   GU:  No history of incontinence.   MUSCLOSKELETAL: No history  of joint pain or swelling.  No myalgias.   SKIN: Negative for lesions, rash, and itching.   ENDOCRINE: Negative for cold or heat intolerance, polydipsia or goiter.   PSYCH:  No depression or anxiety symptoms.   NEURO: As Above.   Vital Signs:  BP 150/100 mmHg  Pulse 87  Wt 212 lb 5 oz (96.304 kg)  SpO2 84% Pain Scale: 0 on a scale of 0-10   General: Well appearing, comfortable Pulm:  Non-labored breathing, reduced breath sounds  Neurological Exam: MENTAL STATUS including orientation to time, place, person, recent and remote memory,  attention span and concentration, language, and fund of knowledge is normal.  Speech is not dysarthric.  CRANIAL NERVES:  Pupils equal round and reactive to light.  Normal conjugate, extra-ocular eye movements in all directions of gaze.   Face is symmetric.   MOTOR:  Motor strength is 5/5 in all extremities, except left intrinsic hand muscles 5-/5 - left finger extensors and flexors show 5/5 (improved), bilateral ABP is 5/5.  Mild left FDI atrophy.  No pronator drift.  Tone is normal.    MSRs:  Reflexes are 2+/4 throughout  SENSORY:  Intact to pin prick and vibration throughout.  COORDINATION/GAIT:  Gait narrow based and stable.   Data: NCS/EMG of the upper extremities 03/09/2015:  The electrophysiologic findings are most consistent with a bilateral median neuropathies at the wrist and bilateral ulnar neuropathies at the elbow, which is severe and acute on the left. Alternatively, a polyradiculoneuropathy should be considered. Clinical correlation recommended.   CSF 04/07/2015:  W0 R1 G65 P34, no OCB, MBP <2.0, IgG index 0.49, cryoptococcal antigen neg, ACE 10, fungal culture neg  Lab Results  Component Value Date   HGBA1C 5.2 10/26/2014    IMPRESSION: 1.  Bilateral CTS 2.  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  Given that he is improving, will treat as nerve entrapment with avoidance of repetitive injury to the nerves and to avoid hyperflexion.    PLAN/RECOMMENDATIONS:  1.  Check ESR, vitamin B12, TSH 2.  Start using a soft elbow pad during the day to prevent compression of the nerve at the elbow 3.  At nighttime, use the elbow bad to prevent over flexion of the elbow 4.  Use wrist splints and avoid over flexing at the wrist 5.  Patient has low pulse oximetry of 84% and appears comfortable, non-labored breathing, and denies  shortness of breath or chest pain.  [place] his oxygen, but is feel well and asymptomatic.  If he develops any dyspnea, he was recommended to go to the ER  Return to clinic in 4 months   The duration of this appointment visit was 30 minutes of face-to-face time with the patient.  Greater than 50% of this time was spent in counseling, explanation of diagnosis, planning of further management, and coordination of care.   Thank you for allowing me to participate in patient's care.  If I can answer any additional questions, I would be pleased to do so.    Sincerely,    Dearra Myhand K. Posey Pronto, DO

## 2015-05-07 NOTE — Telephone Encounter (Signed)
Patient in for visit.  O2 level 84% and BP 150/100.  Patient states that he feels fine and his O2 is low because he just walked over here and does not have his O2 with him.   He says that he does not normally wear his O2 during the day.  Instructed him to go to ER if he gets extremely SOB or starts feeling bad.

## 2015-05-13 ENCOUNTER — Other Ambulatory Visit: Payer: Self-pay | Admitting: *Deleted

## 2015-05-13 MED ORDER — VITAMIN B-12 1000 MCG SL SUBL: 1.0000 | SUBLINGUAL_TABLET | Freq: Every day | SUBLINGUAL | Status: DC

## 2015-05-13 MED ORDER — VITAMIN B-12 1000 MCG SL SUBL: 1.0000 | SUBLINGUAL_TABLET | Freq: Once | SUBLINGUAL | Status: DC

## 2015-05-13 MED FILL — PROAIR HFA 90 MCG INHALER: 108 (90 BAS | 30 days supply | Qty: 9 | Fill #5

## 2015-05-13 MED FILL — METOPROLOL SUCC ER 50 MG TA: 50 | 30 days supply | Qty: 30 | Fill #1

## 2015-05-13 MED FILL — LOSARTAN POTASSIUM 50 MG TA: 50 | 30 days supply | Qty: 30 | Fill #2

## 2015-05-13 MED FILL — SPIRIVA 18 MCG CP-HANDIHALE: 18 | 30 days supply | Qty: 30 | Fill #2

## 2015-05-17 MED FILL — PROAIR HFA 90 MCG INHALER: 108 (90 BAS | 16 days supply | Qty: 17 | Fill #0

## 2015-05-31 ENCOUNTER — Other Ambulatory Visit: Payer: Self-pay | Admitting: Family Medicine

## 2015-05-31 MED FILL — ALLOPURINOL 100 MG TABLET: 100 | 30 days supply | Qty: 30 | Fill #1

## 2015-05-31 MED FILL — GABAPENTIN 300 MG CAPSULE: 300 | 30 days supply | Qty: 30 | Fill #0

## 2015-06-11 MED FILL — PROAIR HFA 90 MCG INHALER: 108 (90 BAS | 16 days supply | Qty: 17 | Fill #1

## 2015-06-11 MED FILL — SPIRIVA 18 MCG CP-HANDIHALE: 18 | 30 days supply | Qty: 30 | Fill #3

## 2015-06-13 ENCOUNTER — Ambulatory Visit (HOSPITAL_BASED_OUTPATIENT_CLINIC_OR_DEPARTMENT_OTHER): Payer: Medicaid Other | Attending: Pulmonary Disease

## 2015-06-13 VITALS — Ht 67.0 in | Wt 215.0 lb

## 2015-06-13 DIAGNOSIS — G4736 Sleep related hypoventilation in conditions classified elsewhere: Secondary | ICD-10-CM | POA: Insufficient documentation

## 2015-06-13 DIAGNOSIS — G4733 Obstructive sleep apnea (adult) (pediatric): Secondary | ICD-10-CM | POA: Diagnosis present

## 2015-06-13 DIAGNOSIS — R0683 Snoring: Secondary | ICD-10-CM | POA: Diagnosis not present

## 2015-06-13 DIAGNOSIS — G478 Other sleep disorders: Secondary | ICD-10-CM | POA: Insufficient documentation

## 2015-06-13 DIAGNOSIS — J449 Chronic obstructive pulmonary disease, unspecified: Secondary | ICD-10-CM | POA: Diagnosis not present

## 2015-06-13 DIAGNOSIS — I272 Other secondary pulmonary hypertension: Secondary | ICD-10-CM | POA: Insufficient documentation

## 2015-06-15 DIAGNOSIS — G478 Other sleep disorders: Secondary | ICD-10-CM | POA: Diagnosis not present

## 2015-06-15 NOTE — Progress Notes (Signed)
Patient Name: [name] [name], [name] [name] Date: 06/13/2015 Gender: Male D.O.B: 23-Dec-1960 Age (years): 39 Referring Provider: [name] Height (inches): 83 Interpreting Physician: Kara Mead MD, ABSM Weight (lbs): 215 RPSGT: Madelon Lips BMI: 34 MRN: [id number] Neck Size: 15.50   CLINICAL INFORMATION Sleep Study Type: NPSG Indication for sleep study: COPD, OSA, Pulmonary hypertension Epworth Sleepiness Score: 8   SLEEP STUDY TECHNIQUE As per the AASM Manual for the Scoring of Sleep and Associated Events v2.3 (April 2016) with a hypopnea requiring 4% desaturations. The channels recorded and monitored were frontal, central and occipital EEG, electrooculogram (EOG), submentalis EMG (chin), nasal and oral airflow, thoracic and abdominal wall motion, anterior tibialis EMG, snore microphone, electrocardiogram, and pulse oximetry.   MEDICATIONS  Medications self-administered by patient during sleep study : No sleep medicine administered.   SLEEP ARCHITECTURE The study was initiated at 10:30:49 PM and ended at 4:40:39 AM. Sleep onset time was 8.2 minutes and the sleep efficiency was 86.7%. The total sleep time was 320.5 minutes. Stage REM latency was 106.5 minutes. The patient spent 13.26% of the night in stage N1 sleep, 67.08% in stage N2 sleep, 0.00% in stage N3 and 19.66% in REM. Alpha intrusion was absent. Supine sleep was 0.00%.   RESPIRATORY PARAMETERS The overall apnea/hypopnea index (AHI) was 4.7 per hour. There were 5 total apneas, including 5 obstructive, 0 central and 0 mixed apneas. There were 20 hypopneas and 13 RERAs. The AHI during Stage REM sleep was 16.2 per hour. AHI while supine was N/A per hour. The mean oxygen saturation was 92.49%. The minimum SpO2 during sleep was 79.00%. Loud snoring was noted during this study.   CARDIAC DATA The 2 lead EKG demonstrated sinus rhythm. The mean heart rate was 70.34 beats per minute. Other EKG findings include: None.   LEG  MOVEMENT DATA The total PLMS were 0 with a resulting PLMS index of 0.00. Associated arousal with leg movement index was 0.0 .   IMPRESSIONS - No significant obstructive sleep apnea occurred during this study (AHI = 4.7/h). RDI was slightly higher about 7/hour suggesting upper airway resistance syndrome. - No significant central sleep apnea occurred during this study (CAI = 0.0/h). - Moderate oxygen desaturation was noted during this study (Min O2 = 79.00%). - The patient snored with Loud snoring volume. - No cardiac abnormalities were noted during this study. - Clinically significant periodic limb movements did not occur during sleep. No significant associated arousals.   DIAGNOSIS - Upper airway resistance syndrome - Nocturnal hypoxemia-note that the study was performed on 2 L of oxygen   RECOMMENDATIONS - Sleep disordered breathing is not of sufficient degree to warrant CPAP therapy - Avoid alcohol, sedatives and other CNS depressants that may worsen sleep apnea and disrupt normal sleep architecture. - Sleep hygiene should be reviewed to assess factors that may improve sleep quality. - Weight management and regular exercise should be initiated or continued if appropriate.  Kara Mead MD. [facility]. [facility]

## 2015-06-21 MED FILL — METOPROLOL SUCC ER 50 MG TA: 50 | 30 days supply | Qty: 30 | Fill #2

## 2015-06-21 MED FILL — LOSARTAN POTASSIUM 50 MG TA: 50 | 30 days supply | Qty: 30 | Fill #3

## 2015-07-12 MED FILL — SPIRIVA 18 MCG CP-HANDIHALE: 18 | 30 days supply | Qty: 30 | Fill #4

## 2015-07-12 MED FILL — ALLOPURINOL 100 MG TABLET: 100 | 30 days supply | Qty: 30 | Fill #2

## 2015-07-12 MED FILL — GABAPENTIN 300 MG CAPSULE: 300 | 30 days supply | Qty: 30 | Fill #1

## 2015-07-22 ENCOUNTER — Ambulatory Visit (INDEPENDENT_AMBULATORY_CARE_PROVIDER_SITE_OTHER): Payer: Medicaid Other | Admitting: Pulmonary Disease

## 2015-07-22 ENCOUNTER — Encounter: Payer: Self-pay | Admitting: Pulmonary Disease

## 2015-07-22 VITALS — BP 164/104 | HR 89 | Ht 67.0 in | Wt 212.0 lb

## 2015-07-22 DIAGNOSIS — J449 Chronic obstructive pulmonary disease, unspecified: Secondary | ICD-10-CM | POA: Diagnosis not present

## 2015-07-22 DIAGNOSIS — IMO0001 Reserved for inherently not codable concepts without codable children: Secondary | ICD-10-CM

## 2015-07-22 DIAGNOSIS — Z87891 Personal history of nicotine dependence: Secondary | ICD-10-CM | POA: Diagnosis not present

## 2015-07-22 NOTE — Progress Notes (Signed)
PFT performed today. 

## 2015-07-22 NOTE — Addendum Note (Signed)
Addended by: Parke Poisson E on: 07/22/2015 05:30 PM   Modules accepted: Orders

## 2015-07-22 NOTE — Progress Notes (Signed)
Subjective:    Patient ID: [name] [name], male    DOB: [birth year], 55 y.o.   MRN: [id number]  HPI Follow up for COPD.  [name] [name] is a 55 year old with extensive smoking history. His main complaint is dyspnea on exertion for the past several years. He has chronic cough with mucus production. Denies any wheezing, hemoptysis. He was maintained initially on just albuterol rescue inhaler. He has been started on Spiriva a couple of weeks ago by his primary physician.   He feels very currently. He has dyspnea on exertion which is not different from baseline. Denies any cough, sputum production, hemoptysis, fever, weight loss.  DATA: Chest x-ray 10/26/14. Emphysema. No focal consolidation or pneumothorax.  Echo 10/27/14 Normal LV function; mild LVH and LVE; grade 1 diastolic dysfunction; moderate LAE; mild RAE; trace TR; severely elevated pulmonary pressure. PAP 72  CTA scan 12/13/13 1. No pulmonary embolism. Severe centrilobular emphysema. 2. Mild airway thickening favoring the lower lobes, possibly a manifestation of bronchitis or reactive airways disease. 3. Mild atelectasis in both lower lobes in the lingula. 4. Cardiomegaly, with particular right heart enlargement.  PFTs. 4/13/17VC 2.09 [PERCENT) FEV1 1.04 (36%) F/F 50 DLCO 38% Severe obstructive lung disease with reduction in diffusion capacity.  Sleep study 06/15/15 No significant obstructive sleep apnea, moderate O2 desaturation  Social History: He smoked about one half pack per day for nearly 40 years. He quit in November 2016.  Family History: Mother-colon cancer Father-heart disease  Past Medical History  Diagnosis Date  . Gout   . Thrombocytopenia ([place]) 07/28/2012  . Tobacco abuse 07/29/2012  . Influenza with respiratory manifestations 07/30/2012  . Asthma 1999  . 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 COPD (chronic obstructive pulmonary disease) ([place]) 12/15/2013  . Essential hypertension   . Pneumonia        Current outpatient prescriptions:  .  albuterol (PROVENTIL HFA;VENTOLIN HFA) 108 (90 Base) MCG/ACT inhaler, Inhale 2 puffs into the lungs every 4 (four) hours as needed for wheezing or shortness of breath., Disp: 54 g, Rfl: 3 .  allopurinol (ZYLOPRIM) 100 MG tablet, TAKE 1 TABLET BY MOUTH DAILY., Disp: 30 tablet, Rfl: 2 .  amLODipine (NORVASC) 10 MG tablet, Take 1 tablet (10 mg total) by mouth daily., Disp: 30 tablet, Rfl: 5 .  Cyanocobalamin (VITAMIN B-12) 1000 MCG SUBL, Place 1 tablet (1,000 mcg total) under the tongue daily., Disp: 30 tablet, Rfl: 11 .  diphenhydrAMINE (BENADRYL) 25 MG tablet, Take 50 mg by mouth once as needed for itching or allergies., Disp: , Rfl:  .  gabapentin (NEURONTIN) 300 MG capsule, TAKE ONE CAPSULE BY MOUTH AT BEDTIME, Disp: 30 capsule, Rfl: 2 .  losartan (COZAAR) 50 MG tablet, TAKE 1 TABLET BY MOUTH DAILY., Disp: 90 tablet, Rfl: 3 .  metoprolol succinate (TOPROL-XL) 50 MG 24 hr tablet, TAKE 1 TABLET BY MOUTH DAILY. TAKE WITH OR IMMEDIATELY FOLLOWING A MEAL., Disp: 30 tablet, Rfl: 2 .  SPIRIVA HANDIHALER 18 MCG inhalation capsule, Place 1 puff into inhaler and inhale daily., Disp: , Rfl: 4 .  [DISCONTINUED] ALBUTEROL IN, Inhale into the lungs., Disp: , Rfl:    Review of Systems Dyspnea on exertion, chronic cough, mucous production. No wheezing, hemoptysis. Has snoring, daytime sleepiness, fatigue. No chest pain, palpitation. No nausea, vomiting, diarrhea, constipation. No fevers, chills, loss of weight, loss of appetite, fatigue. All other review of systems are negative    Objective:   Physical Exam  Gen: No apparent distress Neuro: No gross  focal deficits. Neck: No JVD, lymphadenopathy, thyromegaly. RS: Clear, No wheeze, crackles CVS: S1-S2 heard, no murmurs rubs gallops. Abdomen: Soft, positive bowel sounds. Extremities: No edema.    Assessment & Plan:  #1 COPD Mr. Stratmann has severe COPD based on PFTs. He is stable on Spiriva and will continue  the same along with albuterol rescue inhaler.   #2 Severe pulmonary hypertension. This may be secondary to his severe COPD. He does not have sleep apnea but noted to have significant desaturations even on 2 L oxygen. I suspect he desaturates during exertion as well. I would increase his nocturnal O2. He will need to be checked for ambulatory desats but had leave early today  #3 Heavy ex-smoker. He is a candidate for low-dose screening CT. I will discuss with him at his next visit as he has to leave early today.  Plan: - Continue Spiriva, albuterol rescue inhaler. - Increase nocturnal O2 to 4 lt - Arrange return visit to check ambulatory O2 sats and set up for lung cancer screening.   Return to clinic in 6 months.  [name] MD [facility] Pager [phone] If no answer or after 3pm call: [phone] 07/22/2015, 4:34 PM

## 2015-07-22 NOTE — Patient Instructions (Addendum)
We will increase her nocturnal oxygen to 4 L O2 You wiill need to be checked for ambulatory O2 sats and also setup for lung cancer screening. But since you have to leave early today we will make a return appointment to get these done.  Return to clinic for a regular visit in 6 months

## 2015-07-28 MED FILL — LOSARTAN POTASSIUM 50 MG TA: 50 | 30 days supply | Qty: 30 | Fill #4

## 2015-07-29 MED FILL — PROAIR HFA 90 MCG INHALER: 108 (90 BAS | 16 days supply | Qty: 17 | Fill #2

## 2015-08-04 MED FILL — METOPROLOL SUCC ER 50 MG TA: 50 | 30 days supply | Qty: 30 | Fill #3

## 2015-08-05 ENCOUNTER — Telehealth: Payer: Self-pay | Admitting: Acute Care

## 2015-08-05 NOTE — Telephone Encounter (Signed)
Called and spoke with pt. Informed him that due to his insurance being Medicaid he does not qualify for the Lung Cancer Screening Program per protocol. I explained to him that I would make PM aware. He voiced understanding and had no further questions.

## 2015-08-10 LAB — PULMONARY FUNCTION TEST
DL/VA % PRED: 55 %
DL/VA: 2.46 ml/min/mmHg/L
DLCO COR: 10.73 ml/min/mmHg
DLCO UNC % PRED: 38 %
DLCO UNC: 10.99 ml/min/mmHg
DLCO cor % pred: 37 %
FEF 25-75 PRE: 0.4 L/s
FEF 25-75 Post: 0.46 L/sec
FEF2575-%CHANGE-POST: 16 %
FEF2575-%Pred-Post: 16 %
FEF2575-%Pred-Pre: 14 %
FEV1-%Change-Post: 2 %
FEV1-%PRED-POST: 37 %
FEV1-%PRED-PRE: 36 %
FEV1-Post: 1.07 L
FEV1-Pre: 1.04 L
FEV1FVC-%CHANGE-POST: -2 %
FEV1FVC-%Pred-Pre: 63 %
FEV6-%CHANGE-POST: 3 %
FEV6-%PRED-PRE: 57 %
FEV6-%Pred-Post: 59 %
FEV6-PRE: 2 L
FEV6-Post: 2.07 L
FEV6FVC-%Change-Post: -2 %
FEV6FVC-%PRED-PRE: 100 %
FEV6FVC-%Pred-Post: 98 %
FVC-%Change-Post: 5 %
FVC-%Pred-Post: 60 %
FVC-%Pred-Pre: 57 %
FVC-POST: 2.19 L
FVC-Pre: 2.09 L
POST FEV6/FVC RATIO: 95 %
Post FEV1/FVC ratio: 49 %
Pre FEV1/FVC ratio: 50 %
Pre FEV6/FVC Ratio: 97 %

## 2015-08-16 MED FILL — ALLOPURINOL 100 MG TABLET: 100 | 30 days supply | Qty: 30 | Fill #3

## 2015-08-16 MED FILL — SPIRIVA 18 MCG CP-HANDIHALE: 18 | 30 days supply | Qty: 30 | Fill #5

## 2015-08-16 MED FILL — PROAIR HFA 90 MCG INHALER: 108 (90 BAS | 8 days supply | Qty: 9 | Fill #3

## 2015-09-02 ENCOUNTER — Other Ambulatory Visit: Payer: Self-pay | Admitting: Family Medicine

## 2015-09-02 MED FILL — GABAPENTIN 300 MG CAPSULE: 300 | 30 days supply | Qty: 30 | Fill #2

## 2015-09-02 MED FILL — METOPROLOL SUCC ER 50 MG TA: 50 | 30 days supply | Qty: 30 | Fill #0

## 2015-09-08 MED FILL — LOSARTAN POTASSIUM 50 MG TA: 50 | 30 days supply | Qty: 30 | Fill #5

## 2015-09-09 ENCOUNTER — Ambulatory Visit (INDEPENDENT_AMBULATORY_CARE_PROVIDER_SITE_OTHER): Payer: Medicaid Other | Admitting: Neurology

## 2015-09-09 ENCOUNTER — Encounter: Payer: Self-pay | Admitting: Neurology

## 2015-09-09 VITALS — BP 140/90 | HR 88 | Ht 67.0 in | Wt 214.0 lb

## 2015-09-09 DIAGNOSIS — G5603 Carpal tunnel syndrome, bilateral upper limbs: Secondary | ICD-10-CM

## 2015-09-09 DIAGNOSIS — G5623 Lesion of ulnar nerve, bilateral upper limbs: Secondary | ICD-10-CM | POA: Diagnosis not present

## 2015-09-09 DIAGNOSIS — E538 Deficiency of other specified B group vitamins: Secondary | ICD-10-CM | POA: Diagnosis not present

## 2015-09-09 NOTE — Patient Instructions (Addendum)
Continue vitamin B12 1061mcg daily Continue using a soft elbow pad during the day to prevent compression of the nerve at the elbow Continue to use wrist splints and avoid over flexing at the wrist  Return to clinic as needed

## 2015-09-09 NOTE — Progress Notes (Signed)
Follow-up Visit   Date: 09/09/2015    [name] [name] MRN: [id number] DOB: Mar 22, 1961   Interim History: [name] [name] is a 55 y.o. 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  The patient was accompanied to the clinic by self.  History of present illness: Since August 2016, he had a prolonged flare of gout which was severe. He says that the entire hand became swollen for two months. He was treated with allopurinol and prednisone and since the swelling improved, he feels as if there is a knot at the base of the thumb and he has difficulty extending his thumb. He also has tingling of all the fingers on the left. He has been using a wrist splint which helps with the pain, but no difference with tingling. He denies any neck pain or similar symptoms on the right.  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  Because of his NCS findings and possibility for polyradiculoneuropathy, he underwent CSF testing which returned normal.  He reports that sensation has started to return in his last two fingers on the left and attributes this to home exercises which he is doing.  Denies any worsening of weakness or paresthesias.   He endorses spending at least 6 hr a day with his elbows flexed watching TV on the side of the bed and sleeping with his arms flexed over his head.   UPDATE 09/09/2015:  He has been compliant with using the wrist splints and elbow pads.  His vitamin B12 was found to be very low, but he was unable to come for injections, so he was started on vitamin B12 102mcg sublingual.  Within about three weeks, he noticed improved strength and resolution of tingling.    Medications:  Current Outpatient Prescriptions on File Prior to  Visit  Medication Sig Dispense Refill  . albuterol (PROVENTIL HFA;VENTOLIN HFA) 108 (90 Base) MCG/ACT inhaler Inhale 2 puffs into the lungs every 4 (four) hours as needed for wheezing or shortness of breath. 54 g 3  . allopurinol (ZYLOPRIM) 100 MG tablet TAKE 1 TABLET BY MOUTH DAILY. 30 tablet 2  . amLODipine (NORVASC) 10 MG tablet Take 1 tablet (10 mg total) by mouth daily. 30 tablet 5  . Cyanocobalamin (VITAMIN B-12) 1000 MCG SUBL Place 1 tablet (1,000 mcg total) under the tongue daily. 30 tablet 11  . diphenhydrAMINE (BENADRYL) 25 MG tablet Take 50 mg by mouth once as needed for itching or allergies.    Marland Kitchen gabapentin (NEURONTIN) 300 MG capsule TAKE ONE CAPSULE BY MOUTH AT BEDTIME 30 capsule 2  . losartan (COZAAR) 50 MG tablet TAKE 1 TABLET BY MOUTH DAILY. 90 tablet 3  . metoprolol succinate (TOPROL-XL) 50 MG 24 hr tablet Take 1 tablet (50 mg total) by mouth daily. Must have OV for refills 30 tablet 0  . SPIRIVA HANDIHALER 18 MCG inhalation capsule Place 1 puff into inhaler and inhale daily.  4  . [DISCONTINUED] ALBUTEROL IN Inhale into the lungs.     No current facility-administered medications on file prior to visit.    Allergies:  Allergies  Allergen Reactions  . Atorvastatin Itching and Swelling    Facial, tongue swelling  . Shellfish Allergy Anaphylaxis  . Colchicine Hives    Review of Systems:  CONSTITUTIONAL: No  fevers, chills, night sweats, or weight loss.  EYES: No visual changes or eye pain ENT: No hearing changes.  No history of nose bleeds.   RESPIRATORY: No cough, wheezing and shortness of breath.   CARDIOVASCULAR: Negative for chest pain, and palpitations.   GI: Negative for abdominal discomfort, blood in stools or black stools.  No recent change in bowel habits.   GU:  No history of incontinence.   MUSCLOSKELETAL: No history of joint pain or swelling.  No myalgias.   SKIN: Negative for lesions, rash, and itching.   ENDOCRINE: Negative for cold or heat intolerance,  polydipsia or goiter.   PSYCH:  No depression or anxiety symptoms.   NEURO: As Above.   Vital Signs:  BP 140/90 mmHg  Pulse 88  Ht 5\' 7"  (1.702 m)  Wt 214 lb (97.07 kg)  BMI 33.51 kg/m2  SpO2 84% Pain Scale: 0 on a scale of 0-10   General: Well appearing, comfortable Pulm:  Non-labored breathing, reduced breath sounds  Neurological Exam: MENTAL STATUS including orientation to time, place, person, recent and remote memory, attention span and concentration, language, and fund of knowledge is normal.  Speech is not dysarthric.  CRANIAL NERVES:  Pupils equal round and reactive to light.  Normal conjugate, extra-ocular eye movements in all directions of gaze.   Face is symmetric.   MOTOR:  Motor strength is 5/5 in all extremities, including left intrinsic hand muscles and finger extensors (improved), bilateral ABP is 5/5.  Mild left FDI atrophy.  No pronator drift.  Tone is normal.    MSRs:  Reflexes are 2+/4 throughout  SENSORY:  Intact to pin prick and vibration throughout.  COORDINATION/GAIT:  Gait narrow based and stable.   Data: NCS/EMG of the upper extremities 03/09/2015:  The electrophysiologic findings are most consistent with a bilateral median neuropathies at the wrist and bilateral ulnar neuropathies at the elbow, which is severe and acute on the left. Alternatively, a polyradiculoneuropathy should be considered. Clinical correlation recommended.   CSF 04/07/2015:  W0 R1 G65 P34, no OCB, MBP <2.0, IgG index 0.49, cryoptococcal antigen neg, ACE 10, fungal culture neg  Lab Results  Component Value Date   HGBA1C 5.2 10/26/2014   Lab Results  Component Value Date   TSH 1.24 05/07/2015   Lab Results  Component Value Date   VITAMINB12 121* 05/07/2015    IMPRESSION/PLAN: 1.  Bilateral CTS 2.  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  Vitamin B12 deficiency  He reports clinical  improvement since implementing conservative therapies with wrist splint, elbow pad, and avoiding compression of the nerve. Since taking vitamin B12 he has also noticed a huge improvement, so some of his neuropathy may be exacerbated by vitamin deficiency.   He will continue vitamin B12 1046mcg daily and conservative therapies for CTS and ulnar neuropathy.  Return to clinic as needed   The duration of this appointment visit was 15 minutes of face-to-face time with the patient.  Greater than 50% of this time was spent in counseling, explanation of diagnosis, planning of further management, and coordination of care.   Thank you for allowing me to participate in patient's care.  If I can answer any additional questions, I would be pleased to do so.    Sincerely,    Donika K. Posey Pronto, DO

## 2015-09-17 ENCOUNTER — Encounter: Payer: Self-pay | Admitting: Family Medicine

## 2015-09-17 ENCOUNTER — Ambulatory Visit: Payer: Medicaid Other | Attending: Family Medicine | Admitting: Family Medicine

## 2015-09-17 VITALS — BP 168/109 | HR 74 | Temp 98.5°F | Resp 16 | Ht 67.0 in | Wt 215.0 lb

## 2015-09-17 DIAGNOSIS — Z79899 Other long term (current) drug therapy: Secondary | ICD-10-CM | POA: Diagnosis not present

## 2015-09-17 DIAGNOSIS — Z1159 Encounter for screening for other viral diseases: Secondary | ICD-10-CM | POA: Diagnosis not present

## 2015-09-17 DIAGNOSIS — Z87891 Personal history of nicotine dependence: Secondary | ICD-10-CM | POA: Diagnosis not present

## 2015-09-17 DIAGNOSIS — I10 Essential (primary) hypertension: Secondary | ICD-10-CM

## 2015-09-17 DIAGNOSIS — M109 Gout, unspecified: Secondary | ICD-10-CM

## 2015-09-17 DIAGNOSIS — Z6830 Body mass index (BMI) 30.0-30.9, adult: Secondary | ICD-10-CM | POA: Diagnosis not present

## 2015-09-17 DIAGNOSIS — E66811 Obesity, class 1: Secondary | ICD-10-CM

## 2015-09-17 DIAGNOSIS — J449 Chronic obstructive pulmonary disease, unspecified: Secondary | ICD-10-CM | POA: Insufficient documentation

## 2015-09-17 DIAGNOSIS — E669 Obesity, unspecified: Secondary | ICD-10-CM | POA: Diagnosis not present

## 2015-09-17 DIAGNOSIS — E538 Deficiency of other specified B group vitamins: Secondary | ICD-10-CM | POA: Diagnosis not present

## 2015-09-17 DIAGNOSIS — IMO0001 Reserved for inherently not codable concepts without codable children: Secondary | ICD-10-CM

## 2015-09-17 LAB — COMPLETE METABOLIC PANEL WITH GFR
ALT: 18 U/L (ref 9–46)
AST: 14 U/L (ref 10–35)
Albumin: 4 g/dL (ref 3.6–5.1)
Alkaline Phosphatase: 84 U/L (ref 40–115)
BUN: 11 mg/dL (ref 7–25)
CALCIUM: 10.1 mg/dL (ref 8.6–10.3)
CHLORIDE: 100 mmol/L (ref 98–110)
CO2: 34 mmol/L — ABNORMAL HIGH (ref 20–31)
Creat: 1.5 mg/dL — ABNORMAL HIGH (ref 0.70–1.33)
GFR, EST AFRICAN AMERICAN: 59 mL/min — AB (ref >=60)
GFR, Est Non African American: 51 mL/min — ABNORMAL LOW (ref >=60)
Glucose, Bld: 84 mg/dL (ref 65–99)
POTASSIUM: 4.9 mmol/L (ref 3.5–5.3)
Sodium: 140 mmol/L (ref 135–146)
Total Bilirubin: 0.9 mg/dL (ref 0.2–1.2)
Total Protein: 7.5 g/dL (ref 6.1–8.1)

## 2015-09-17 LAB — VITAMIN B12: VITAMIN B 12: 671 pg/mL (ref 200–1100)

## 2015-09-17 LAB — URIC ACID: Uric Acid, Serum: 7.2 mg/dL (ref 4.0–8.0)

## 2015-09-17 MED ORDER — SPIRIVA HANDIHALER 18 MCG IN CAPS: 18.0000 ug | ORAL_CAPSULE | Freq: Every day | RESPIRATORY_TRACT | Status: DC

## 2015-09-17 MED ORDER — ALBUTEROL SULFATE (5 MG/ML) 0.5% IN NEBU: 2.5000 mg | INHALATION_SOLUTION | Freq: Four times a day (QID) | RESPIRATORY_TRACT | Status: DC | PRN

## 2015-09-17 MED ORDER — LOSARTAN POTASSIUM 50 MG PO TABS: 50.0000 mg | ORAL_TABLET | Freq: Every day | ORAL | Status: DC

## 2015-09-17 MED ORDER — ALBUTEROL SULFATE HFA 108 (90 BASE) MCG/ACT IN AERS: 2.0000 | INHALATION_SPRAY | RESPIRATORY_TRACT | Status: DC | PRN

## 2015-09-17 MED ORDER — ALBUTEROL SULFATE (2.5 MG/3ML) 0.083% IN NEBU: 2.5000 mg | INHALATION_SOLUTION | Freq: Four times a day (QID) | RESPIRATORY_TRACT | Status: DC | PRN

## 2015-09-17 MED ORDER — METOPROLOL SUCCINATE ER 50 MG PO TB24: 50.0000 mg | ORAL_TABLET | Freq: Every day | ORAL | Status: DC

## 2015-09-17 MED ORDER — AMLODIPINE BESYLATE 10 MG PO TABS: 10.0000 mg | ORAL_TABLET | Freq: Every day | ORAL | Status: DC

## 2015-09-17 MED FILL — PROAIR HFA 90 MCG INHALER: 108 (90 BAS | 30 days supply | Qty: 9 | Fill #0

## 2015-09-17 MED FILL — ALBUTEROL 0.083% INHAL SOLN: (2.5 MG/3ML | 30 days supply | Qty: 90 | Fill #0

## 2015-09-17 MED FILL — AMLODIPINE BESYLATE 10 MG T: 10 | 30 days supply | Qty: 30 | Fill #0

## 2015-09-17 NOTE — Patient Instructions (Addendum)
Nohl was seen today for hypertension.  Diagnoses and all orders for this visit:  Essential hypertension -     COMPLETE METABOLIC PANEL WITH GFR -     metoprolol succinate (TOPROL-XL) 50 MG 24 hr tablet; Take 1 tablet (50 mg total) by mouth daily. -     losartan (COZAAR) 50 MG tablet; Take 1 tablet (50 mg total) by mouth daily. -     amLODipine (NORVASC) 10 MG tablet; Take 1 tablet (10 mg total) by mouth daily.  Gout without tophus, unspecified cause, unspecified chronicity, unspecified site -     Uric Acid  Vitamin B12 deficiency -     Vitamin B12  Need for hepatitis C screening test -     Hepatitis C antibody, reflex  COPD bronchitis -     Discontinue: albuterol (PROVENTIL) (5 MG/ML) 0.5% nebulizer solution; Take 0.5 mLs (2.5 mg total) by nebulization every 6 (six) hours as needed for wheezing or shortness of breath. -     SPIRIVA HANDIHALER 18 MCG inhalation capsule; Place 1 capsule (18 mcg total) into inhaler and inhale daily. -     albuterol (PROVENTIL HFA;VENTOLIN HFA) 108 (90 Base) MCG/ACT inhaler; Inhale 2 puffs into the lungs every 4 (four) hours as needed for wheezing or shortness of breath. -     albuterol (PROVENTIL) (2.5 MG/3ML) 0.083% nebulizer solution; Take 3 mLs (2.5 mg total) by nebulization every 6 (six) hours as needed for wheezing or shortness of breath.  Obesity (BMI 30.0-34.9) -     HgB A1c    There should be no smoking in your home Eat a low salt diet Exercise regularly with your inhaler, pushing your self to walk a little bit more each week  Call to schedule follow up with your pulmonologist  F/u with me in 4-6 weeks for HTN  Dr. Adrian Blackwater

## 2015-09-17 NOTE — Assessment & Plan Note (Signed)
A: uncontrolled. Intermittently non compliant with meds P: CMP Losartan increased to 100 mg Continue all other meds at same dose

## 2015-09-17 NOTE — Progress Notes (Signed)
Subjective:  Patient ID: [name] [name], male    DOB: 05-Jan-1961  Age: 55 y.o. MRN: [id number]  CC: Hypertension   HPI [name] [name] has COPD, HN, gout, CKD he  presents for   1. CHRONIC HYPERTENSION  Disease Monitoring  Blood pressure range: not checking   Chest pain: no   Dyspnea: yes, chronically    Claudication: no   Medication compliance: yes, but was out of norvasc 3 days prior to today   Medication Side Effects  Lightheadedness: no   Urinary frequency: no   Edema: no     Preventitive Healthcare:  Exercise: yes, walks sometimes    Diet Pattern: eats regular meals, eats junk food daily   Salt Restriction: yes   2. COPD:  He is chronically SOB. He feels like he gets SOB easily. No CP or fever. He quit smoking 21 months ago. He using 3 L via [place] supplemental O2 at night. Compliant with Spiriva. Request refill of albuterol. He comes today to have an office oximetry testing oxygen saturation report done to continue home O2.    3. Weight gain: he reports eating junk food daily. Minimal exercise. He eats a low salt diet. He is concerned about his weight gain.   Social History  Substance Use Topics  . Smoking status: Former Smoker -- 1.00 packs/day for 20 years    Quit date: 12/15/2013  . Smokeless tobacco: Never Used     Comment: 'Quitting"  . Alcohol Use: 0.0 oz/week    0 Standard drinks or equivalent per week     Comment: occaisional beer - previously one beer daily, stopped 4 months    Outpatient Prescriptions Prior to Visit  Medication Sig Dispense Refill  . albuterol (PROVENTIL HFA;VENTOLIN HFA) 108 (90 Base) MCG/ACT inhaler Inhale 2 puffs into the lungs every 4 (four) hours as needed for wheezing or shortness of breath. 54 g 3  . allopurinol (ZYLOPRIM) 100 MG tablet TAKE 1 TABLET BY MOUTH DAILY. 30 tablet 2  . amLODipine (NORVASC) 10 MG tablet Take 1 tablet (10 mg total) by mouth daily. 30 tablet 5  . Cyanocobalamin (VITAMIN B-12) 1000 MCG SUBL Place 1 tablet (1,000  mcg total) under the tongue daily. 30 tablet 11  . diphenhydrAMINE (BENADRYL) 25 MG tablet Take 50 mg by mouth once as needed for itching or allergies.    Marland Kitchen gabapentin (NEURONTIN) 300 MG capsule TAKE ONE CAPSULE BY MOUTH AT BEDTIME 30 capsule 2  . losartan (COZAAR) 50 MG tablet TAKE 1 TABLET BY MOUTH DAILY. 90 tablet 3  . metoprolol succinate (TOPROL-XL) 50 MG 24 hr tablet Take 1 tablet (50 mg total) by mouth daily. Must have OV for refills 30 tablet 0  . SPIRIVA HANDIHALER 18 MCG inhalation capsule Place 1 puff into inhaler and inhale daily.  4   No facility-administered medications prior to visit.    ROS Review of Systems  Constitutional: Negative for fever, chills, fatigue and unexpected weight change.  Eyes: Negative for visual disturbance.  Respiratory: Positive for shortness of breath. Negative for cough.   Cardiovascular: Negative for chest pain, palpitations and leg swelling.  Gastrointestinal: Negative for nausea, vomiting, abdominal pain, diarrhea, constipation and blood in stool.  Endocrine: Negative for polydipsia, polyphagia and polyuria.  Musculoskeletal: Negative for myalgias, back pain, arthralgias, gait problem and neck pain.  Skin: Negative for rash.       Xerotic skin   Allergic/Immunologic: Negative for immunocompromised state.  Hematological: Negative for adenopathy. Does not bruise/bleed easily.  Psychiatric/Behavioral: Negative for suicidal ideas, sleep disturbance and dysphoric mood. The patient is not nervous/anxious.     Objective:  BP 168/109 mmHg  Pulse 74  Temp(Src) 98.5 F (36.9 C) (Oral)  Resp 16  Ht 5\' 7"  (1.702 m)  Wt 215 lb (97.523 kg)  BMI 33.67 kg/m2  SpO2 90% on RA resting, 80% on RA with exertion, 93% on 3 LMP via Center with exertion   BP/Weight 09/17/2015 09/09/2015 123456  Systolic BP XX123456 [id number] 123456  Diastolic BP 0000000 90 123456  Wt. (Lbs) 215 214 212  BMI 33.67 33.51 33.2   Physical Exam  Constitutional: He appears well-developed and  well-nourished. No distress.  HENT:  Head: Normocephalic and atraumatic.  Neck: Normal range of motion. Neck supple.  Cardiovascular: Normal rate, regular rhythm, normal heart sounds and intact distal pulses.   Pulmonary/Chest: Effort normal and breath sounds normal.  Musculoskeletal: He exhibits no edema.  Neurological: He is alert.  Skin: Skin is warm and dry. No rash noted. No erythema.  Psychiatric: He has a normal mood and affect.   Lab Results  Component Value Date   HGBA1C 5.2 10/26/2014     Assessment & Plan:   There are no diagnoses linked to this encounter. [name] [name] was seen today for hypertension.  Diagnoses and all orders for this visit:  Essential hypertension -     COMPLETE METABOLIC PANEL WITH GFR -     metoprolol succinate (TOPROL-XL) 50 MG 24 hr tablet; Take 1 tablet (50 mg total) by mouth daily. -     losartan (COZAAR) 50 MG tablet; Take 1 tablet (50 mg total) by mouth daily. -     amLODipine (NORVASC) 10 MG tablet; Take 1 tablet (10 mg total) by mouth daily.  Gout without tophus, unspecified cause, unspecified chronicity, unspecified site -     Uric Acid  Vitamin B12 deficiency -     Vitamin B12  Need for hepatitis C screening test -     Hepatitis C antibody, reflex  COPD bronchitis -     albuterol (PROVENTIL) (5 MG/ML) 0.5% nebulizer solution; Take 0.5 mLs (2.5 mg total) by nebulization every 6 (six) hours as needed for wheezing or shortness of breath. -     SPIRIVA HANDIHALER 18 MCG inhalation capsule; Place 1 capsule (18 mcg total) into inhaler and inhale daily. -     albuterol (PROVENTIL HFA;VENTOLIN HFA) 108 (90 Base) MCG/ACT inhaler; Inhale 2 puffs into the lungs every 4 (four) hours as needed for wheezing or shortness of breath.  Obesity (BMI 30.0-34.9) -     HgB A1c   Meds ordered this encounter  Medications  . metoprolol succinate (TOPROL-XL) 50 MG 24 hr tablet    Sig: Take 1 tablet (50 mg total) by mouth daily.    Dispense:  180 tablet     Refill:  3  . losartan (COZAAR) 50 MG tablet    Sig: Take 1 tablet (50 mg total) by mouth daily.    Dispense:  90 tablet    Refill:  3  . amLODipine (NORVASC) 10 MG tablet    Sig: Take 1 tablet (10 mg total) by mouth daily.    Dispense:  90 tablet    Refill:  3  . albuterol (PROVENTIL) (5 MG/ML) 0.5% nebulizer solution    Sig: Take 0.5 mLs (2.5 mg total) by nebulization every 6 (six) hours as needed for wheezing or shortness of breath.    Dispense:  20 mL    Refill:  12  . SPIRIVA HANDIHALER 18 MCG inhalation capsule    Sig: Place 1 capsule (18 mcg total) into inhaler and inhale daily.    Dispense:  90 capsule    Refill:  3  . albuterol (PROVENTIL HFA;VENTOLIN HFA) 108 (90 Base) MCG/ACT inhaler    Sig: Inhale 2 puffs into the lungs every 4 (four) hours as needed for wheezing or shortness of breath.    Dispense:  54 g    Refill:  3    Follow-up: No Follow-up on file.   Boykin Nearing MD

## 2015-09-17 NOTE — Addendum Note (Signed)
Addended by: Boykin Nearing on: 09/17/2015 11:12 AM   Modules accepted: Orders, SmartSet

## 2015-09-17 NOTE — Assessment & Plan Note (Signed)
COPD supplemental O2 dependent with exertion and at night. New order for O2 Refilled spiriva and albuterol  Patient to f/u with pulmonology

## 2015-09-17 NOTE — Progress Notes (Signed)
F/U HTN  No pain today  No tobacco user  No suicidal thoughts in the past two weeks   Pt stated out of medication x 3 days. Started medication back last night

## 2015-09-17 NOTE — Assessment & Plan Note (Signed)
He has gained nearly 30 # over last year Recommended low carb diet Increase exercise as tolerated Checking A1c

## 2015-09-18 ENCOUNTER — Encounter: Payer: Self-pay | Admitting: Family Medicine

## 2015-09-18 LAB — HEPATITIS C ANTIBODY: HCV Ab: NEGATIVE

## 2015-09-20 ENCOUNTER — Other Ambulatory Visit: Payer: Self-pay | Admitting: Family Medicine

## 2015-09-20 ENCOUNTER — Telehealth: Payer: Self-pay | Admitting: *Deleted

## 2015-09-20 MED FILL — SPIRIVA 18 MCG CP-HANDIHALE: 18 | 30 days supply | Qty: 30 | Fill #6

## 2015-09-20 MED FILL — ALLOPURINOL 100 MG TABLET: 100 | 30 days supply | Qty: 30 | Fill #0

## 2015-09-20 NOTE — Telephone Encounter (Signed)
-----   Message from Boykin Nearing, MD sent at 09/18/2015 12:33 PM EDT ----- Screening Hep C negative Repeat vit B12 and uric acid normal CO2 retention in COPD, re-ordered home O2, f/u with pulmonology  Stable CKD, keep BP under control to keep kidneys healthy

## 2015-09-20 NOTE — Telephone Encounter (Signed)
Date of birth verified by pt  Negative lab results given  Stable CKD Repeat B12 and Uric acid normal  CO2 retention in COPD, reorder Home 02, F/U with Pulmonology  Pt verbalized understand Control BP. Pt verbalized understanding

## 2015-10-13 MED FILL — GABAPENTIN 300 MG CAPSULE: 300 | 30 days supply | Qty: 30 | Fill #3

## 2015-10-13 MED FILL — LOSARTAN POTASSIUM 50 MG TA: 50 | 30 days supply | Qty: 30 | Fill #6

## 2015-10-13 MED FILL — METOPROLOL SUCC ER 50 MG TA: 50 | 30 days supply | Qty: 30 | Fill #0

## 2015-10-19 MED FILL — AMLODIPINE BESYLATE 10 MG T: 10 | 30 days supply | Qty: 30 | Fill #1

## 2015-10-19 MED FILL — SPIRIVA 18 MCG CP-HANDIHALE: 18 | 30 days supply | Qty: 30 | Fill #7

## 2015-10-28 ENCOUNTER — Ambulatory Visit (INDEPENDENT_AMBULATORY_CARE_PROVIDER_SITE_OTHER): Payer: Medicaid Other | Admitting: Pulmonary Disease

## 2015-10-28 ENCOUNTER — Encounter: Payer: Self-pay | Admitting: Pulmonary Disease

## 2015-10-28 VITALS — BP 140/96 | HR 88 | Ht 67.0 in | Wt 211.0 lb

## 2015-10-28 DIAGNOSIS — J449 Chronic obstructive pulmonary disease, unspecified: Secondary | ICD-10-CM

## 2015-10-28 DIAGNOSIS — IMO0001 Reserved for inherently not codable concepts without codable children: Secondary | ICD-10-CM

## 2015-10-28 NOTE — Progress Notes (Signed)
Subjective:    Patient ID: [name] [name], male    DOB: 1960-10-04, 55 y.o.   MRN: [id number]  PROBLEM LIST Severe COPD Pulmonary hypertension  HPI Mr. Kumari is a 55 year old with extensive smoking history. His main complaint is dyspnea on exertion for the past several years. He has chronic cough with mucus production. Denies any wheezing, hemoptysis. He was maintained initially on just albuterol rescue inhaler. He is maintained on spiriva. He feels very currently. He has dyspnea on exertion which is not different from baseline. Denies any cough, sputum production, hemoptysis, fever, weight loss.  DATA: Chest x-ray 10/26/14. Emphysema. No focal consolidation or pneumothorax.  Echo 10/27/14 Normal LV function; mild LVH and LVE; grade 1 diastolic dysfunction; moderate LAE; mild RAE; trace TR; severely elevated pulmonary pressure. PAP 72  CTA scan 12/13/13 1. No pulmonary embolism. Severe centrilobular emphysema. 2. Mild airway thickening favoring the lower lobes, possibly a manifestation of bronchitis or reactive airways disease. 3. Mild atelectasis in both lower lobes in the lingula. 4. Cardiomegaly, with particular right heart enlargement.  PFTs. 07/22/15 FVC 2.09 [57%) FEV1 1.04 (36%) F/F 50 DLCO 38% Severe obstructive lung disease with reduction in diffusion capacity.  Sleep study 06/15/15 No significant obstructive sleep apnea, moderate O2 desaturation  Social History: He smoked about one half pack per day for nearly 40 years. He quit in November 2016.  Family History: Mother-colon cancer Father-heart disease  Past Medical History  Diagnosis Date  . Gout   . Thrombocytopenia ([place]) 07/28/2012  . Tobacco abuse 07/29/2012  . Influenza with respiratory manifestations 07/30/2012  . Asthma 1999  . 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 COPD (chronic obstructive pulmonary disease) ([place]) 12/15/2013  . Essential hypertension   . Pneumonia      Current outpatient  prescriptions:  .  albuterol (PROVENTIL HFA;VENTOLIN HFA) 108 (90 Base) MCG/ACT inhaler, Inhale 2 puffs into the lungs every 4 (four) hours as needed for wheezing or shortness of breath., Disp: 54 g, Rfl: 3 .  albuterol (PROVENTIL) (2.5 MG/3ML) 0.083% nebulizer solution, Take 3 mLs (2.5 mg total) by nebulization every 6 (six) hours as needed for wheezing or shortness of breath., Disp: 75 mL, Rfl: 12 .  allopurinol (ZYLOPRIM) 100 MG tablet, TAKE 1 TABLET BY MOUTH DAILY., Disp: 30 tablet, Rfl: 2 .  amLODipine (NORVASC) 10 MG tablet, Take 1 tablet (10 mg total) by mouth daily., Disp: 90 tablet, Rfl: 3 .  Cyanocobalamin (VITAMIN B-12) 1000 MCG SUBL, Place 1 tablet (1,000 mcg total) under the tongue daily., Disp: 30 tablet, Rfl: 11 .  diphenhydrAMINE (BENADRYL) 25 MG tablet, Take 50 mg by mouth once as needed for itching or allergies., Disp: , Rfl:  .  gabapentin (NEURONTIN) 300 MG capsule, TAKE ONE CAPSULE BY MOUTH AT BEDTIME, Disp: 30 capsule, Rfl: 2 .  losartan (COZAAR) 50 MG tablet, Take 1 tablet (50 mg total) by mouth daily., Disp: 90 tablet, Rfl: 3 .  metoprolol succinate (TOPROL-XL) 50 MG 24 hr tablet, Take 1 tablet (50 mg total) by mouth daily., Disp: 180 tablet, Rfl: 3 .  SPIRIVA HANDIHALER 18 MCG inhalation capsule, Place 1 capsule (18 mcg total) into inhaler and inhale daily., Disp: 90 capsule, Rfl: 3 .  [DISCONTINUED] ALBUTEROL IN, Inhale into the lungs., Disp: , Rfl:    Review of Systems Dyspnea on exertion, chronic cough, mucous production. No wheezing, hemoptysis. Has snoring, daytime sleepiness, fatigue. No chest pain, palpitation. No nausea, vomiting, diarrhea, constipation. No fevers, chills, loss of weight,  loss of appetite, fatigue. All other review of systems are negative    Objective:   Physical Exam Blood pressure 140/96, pulse 88, height 5\' 7"  (1.702 m), weight 211 lb (95.709 kg), SpO2 90 %. Gen: No apparent distress Neuro: No gross focal deficits. Neck: No JVD,  lymphadenopathy, thyromegaly. RS: Clear, No wheeze, crackles CVS: S1-S2 heard, no murmurs rubs gallops. Abdomen: Soft, positive bowel sounds. Extremities: No edema.    Assessment & Plan:  #1 COPD Mr. Cuttler has severe COPD based on PFTs. He is stable on Spiriva and will continue the same along with albuterol rescue inhaler.   #2 Severe pulmonary hypertension. This may be secondary to his severe COPD. He does not have sleep apnea but noted to have significant desaturations even on 2 L oxygen. I suspect he desaturates during exertion as well but refused to be ambulated today due to a gout flare.   Plan: - Continue Spiriva, albuterol rescue inhaler. - Continue nocturnal O2        - Check ambulatory sats at next visit.                      Return to clinic in 6 months.  [name] MD [facility] Pager [phone] If no answer or after 3pm call: [phone] 10/28/2015, 10:15 AM

## 2015-10-28 NOTE — Patient Instructions (Signed)
Continue using the spiriva.  Return in 6 months

## 2015-11-10 MED FILL — PROAIR HFA 90 MCG INHALER: 108 (90 BAS | 30 days supply | Qty: 9 | Fill #1

## 2015-11-15 MED FILL — SPIRIVA 18 MCG CP-HANDIHALE: 18 | 30 days supply | Qty: 30 | Fill #8

## 2015-11-15 MED FILL — LOSARTAN POTASSIUM 50 MG TA: 50 | 30 days supply | Qty: 30 | Fill #7

## 2015-11-15 MED FILL — ALLOPURINOL 100 MG TABLET: 100 | 30 days supply | Qty: 30 | Fill #1

## 2015-11-15 MED FILL — AMLODIPINE BESYLATE 10 MG T: 10 | 30 days supply | Qty: 30 | Fill #2

## 2015-11-26 ENCOUNTER — Other Ambulatory Visit: Payer: Self-pay | Admitting: Family Medicine

## 2015-11-26 MED FILL — METOPROLOL SUCC ER 50 MG TA: 50 | 30 days supply | Qty: 30 | Fill #1

## 2015-11-26 MED FILL — GABAPENTIN 300 MG CAPSULE: 300 | 30 days supply | Qty: 30 | Fill #0

## 2015-12-15 MED FILL — SPIRIVA 18 MCG CP-HANDIHALE: 18 | 30 days supply | Qty: 30 | Fill #9

## 2015-12-23 MED FILL — AMLODIPINE BESYLATE 10 MG T: 10 | 30 days supply | Qty: 30 | Fill #3

## 2015-12-23 MED FILL — LOSARTAN POTASSIUM 50 MG TA: 50 | 30 days supply | Qty: 30 | Fill #8

## 2015-12-23 MED FILL — GABAPENTIN 300 MG CAPSULE: 300 | 30 days supply | Qty: 30 | Fill #1

## 2015-12-23 MED FILL — ALLOPURINOL 100 MG TABLET: 100 | 30 days supply | Qty: 30 | Fill #2

## 2015-12-23 MED FILL — METOPROLOL SUCC ER 50 MG TA: 50 | 30 days supply | Qty: 30 | Fill #2

## 2016-01-18 MED FILL — SPIRIVA 18 MCG CP-HANDIHALE: 18 | 30 days supply | Qty: 30 | Fill #10

## 2016-01-25 MED FILL — PROAIR HFA 90 MCG INHALER: 108 (90 BAS | 30 days supply | Qty: 9 | Fill #2

## 2016-01-25 MED FILL — LOSARTAN POTASSIUM 50 MG TA: 50 | 30 days supply | Qty: 30 | Fill #9

## 2016-01-25 MED FILL — AMLODIPINE BESYLATE 10 MG T: 10 | 30 days supply | Qty: 30 | Fill #4

## 2016-01-25 MED FILL — METOPROLOL SUCC ER 50 MG TA: 50 | 30 days supply | Qty: 30 | Fill #3

## 2016-01-25 MED FILL — GABAPENTIN 300 MG CAPSULE: 300 | 30 days supply | Qty: 30 | Fill #2

## 2016-01-25 MED FILL — ALLOPURINOL 100 MG TABLET: 100 | 30 days supply | Qty: 30 | Fill #3

## 2016-01-31 MED FILL — ALBUTEROL SUL 2.5 MG/3 ML S: (2.5 MG/3ML | 30 days supply | Qty: 90 | Fill #1

## 2016-02-21 MED FILL — METOPROLOL SUCC ER 50 MG TA: 50 | 30 days supply | Qty: 30 | Fill #4

## 2016-02-21 MED FILL — AMLODIPINE BESYLATE 10 MG T: 10 | 30 days supply | Qty: 30 | Fill #5

## 2016-02-21 MED FILL — LOSARTAN POTASSIUM 50 MG TA: 50 | 30 days supply | Qty: 30 | Fill #10

## 2016-02-21 MED FILL — SPIRIVA 18 MCG CP-HANDIHALE: 18 | 30 days supply | Qty: 30 | Fill #11

## 2016-02-21 MED FILL — PROAIR HFA 90 MCG INHALER: 108 (90 BAS | 30 days supply | Qty: 9 | Fill #3

## 2016-02-29 ENCOUNTER — Other Ambulatory Visit: Payer: Self-pay | Admitting: Family Medicine

## 2016-02-29 MED FILL — ALLOPURINOL 100 MG TABLET: 100 | 30 days supply | Qty: 30 | Fill #0

## 2016-03-15 MED FILL — ALBUTEROL SUL 2.5 MG/3 ML S: (2.5 MG/3ML | 30 days supply | Qty: 90 | Fill #2

## 2016-03-23 ENCOUNTER — Telehealth: Payer: Self-pay | Admitting: Family Medicine

## 2016-03-23 ENCOUNTER — Other Ambulatory Visit: Payer: Self-pay | Admitting: Family Medicine

## 2016-03-23 MED FILL — SPIRIVA 18 MCG CP-HANDIHALE: 18 | 30 days supply | Qty: 30 | Fill #0

## 2016-03-23 NOTE — Telephone Encounter (Signed)
Refilled pt Spiriva HandiHaler.

## 2016-03-23 NOTE — Telephone Encounter (Signed)
Patient called the office to request medication refill for [facility] HANDIHALER 18 MCG inhalation capsule. Please call it in to our pharmacy [facility]).   Thank you.

## 2016-04-11 ENCOUNTER — Other Ambulatory Visit: Payer: Self-pay | Admitting: Family Medicine

## 2016-04-11 MED FILL — ALLOPURINOL 100 MG TABLET: 100 | 30 days supply | Qty: 30 | Fill #0

## 2016-04-11 MED FILL — METOPROLOL SUCC ER 50 MG TA: 50 | 30 days supply | Qty: 30 | Fill #5

## 2016-04-11 MED FILL — LOSARTAN POTASSIUM 50 MG TA: 50 | 30 days supply | Qty: 30 | Fill #0

## 2016-04-11 MED FILL — AMLODIPINE BESYLATE 10 MG T: 10 | 30 days supply | Qty: 30 | Fill #6

## 2016-04-11 MED FILL — GABAPENTIN 300 MG CAPSULE: 300 | 30 days supply | Qty: 30 | Fill #0

## 2016-04-27 ENCOUNTER — Ambulatory Visit: Payer: Self-pay | Admitting: Pulmonary Disease

## 2016-04-28 MED FILL — SPIRIVA 18 MCG CP-HANDIHALE: 18 | 30 days supply | Qty: 30 | Fill #1

## 2016-05-22 ENCOUNTER — Other Ambulatory Visit: Payer: Self-pay | Admitting: Family Medicine

## 2016-05-22 MED FILL — METOPROLOL SUCC ER 50 MG TA: 50 | 30 days supply | Qty: 30 | Fill #6

## 2016-05-22 MED FILL — AMLODIPINE BESYLATE 10 MG T: 10 | 30 days supply | Qty: 30 | Fill #7

## 2016-05-22 MED FILL — LOSARTAN POTASSIUM 50 MG TA: 50 | 30 days supply | Qty: 30 | Fill #1

## 2016-05-24 ENCOUNTER — Other Ambulatory Visit: Payer: Self-pay | Admitting: Family Medicine

## 2016-05-24 MED FILL — ALBUTEROL 0.083% INHAL SOLN: (2.5 MG/3ML | 30 days supply | Qty: 90 | Fill #3

## 2016-05-24 MED FILL — ALLOPURINOL 100 MG TABLET: 100 | 30 days supply | Qty: 30 | Fill #0

## 2016-05-24 MED FILL — PROAIR HFA 90 MCG INHALER: 108 (90 BAS | 30 days supply | Qty: 9 | Fill #4

## 2016-05-24 MED FILL — SPIRIVA 18 MCG CP-HANDIHALE: 18 | 30 days supply | Qty: 30 | Fill #2

## 2016-05-24 MED FILL — GABAPENTIN 300 MG CAPSULE: 300 | 30 days supply | Qty: 30 | Fill #0

## 2016-06-19 ENCOUNTER — Telehealth: Payer: Self-pay

## 2016-06-19 ENCOUNTER — Ambulatory Visit: Payer: Self-pay | Admitting: Pulmonary Disease

## 2016-06-19 NOTE — Telephone Encounter (Signed)
Spoke with pt in regards to rescheduling apt for 06/19/16, due to our office closing at 1pm due to weather. Pt states he will call back to reschedule, due to transportation. Will await call back.

## 2016-06-26 ENCOUNTER — Other Ambulatory Visit: Payer: Self-pay | Admitting: Family Medicine

## 2016-06-26 MED FILL — METOPROLOL SUCC ER 50 MG TA: 50 | 30 days supply | Qty: 30 | Fill #7

## 2016-06-26 MED FILL — LOSARTAN POTASSIUM 50 MG TA: 50 | 30 days supply | Qty: 30 | Fill #2

## 2016-06-26 MED FILL — ALBUTEROL 0.083% INHAL SOLN: (2.5 MG/3ML | 7 days supply | Qty: 90 | Fill #4

## 2016-06-26 MED FILL — PROAIR HFA 90 MCG INHALER: 108 (90 BAS | 16 days supply | Qty: 9 | Fill #5

## 2016-06-26 MED FILL — AMLODIPINE BESYLATE 10 MG T: 10 | 30 days supply | Qty: 30 | Fill #8

## 2016-06-27 MED FILL — SPIRIVA 18 MCG CP-HANDIHALE: 18 | 30 days supply | Qty: 30 | Fill #3

## 2016-06-27 MED FILL — GABAPENTIN 300 MG CAPSULE: 300 | 30 days supply | Qty: 30 | Fill #1

## 2016-06-27 MED FILL — ALLOPURINOL 100 MG TABLET: 100 | 30 days supply | Qty: 30 | Fill #0

## 2016-07-06 ENCOUNTER — Other Ambulatory Visit (INDEPENDENT_AMBULATORY_CARE_PROVIDER_SITE_OTHER): Payer: Medicaid Other

## 2016-07-06 ENCOUNTER — Ambulatory Visit (INDEPENDENT_AMBULATORY_CARE_PROVIDER_SITE_OTHER): Payer: Medicaid Other | Admitting: Pulmonary Disease

## 2016-07-06 ENCOUNTER — Encounter: Payer: Self-pay | Admitting: Pulmonary Disease

## 2016-07-06 VITALS — BP 144/88 | HR 89 | Ht 67.0 in | Wt 229.2 lb

## 2016-07-06 DIAGNOSIS — I272 Pulmonary hypertension, unspecified: Secondary | ICD-10-CM | POA: Diagnosis not present

## 2016-07-06 LAB — COMPREHENSIVE METABOLIC PANEL
ALK PHOS: 85 U/L (ref 39–117)
ALT: 32 U/L (ref 0–53)
AST: 21 U/L (ref 0–37)
Albumin: 4.4 g/dL (ref 3.5–5.2)
BUN: 9 mg/dL (ref 6–23)
CHLORIDE: 101 meq/L (ref 96–112)
CO2: 36 mEq/L — ABNORMAL HIGH (ref 19–32)
Calcium: 9.8 mg/dL (ref 8.4–10.5)
Creatinine, Ser: 1.29 mg/dL (ref 0.40–1.50)
GFR: 73.69 mL/min (ref >60.00)
GLUCOSE: 75 mg/dL (ref 70–99)
POTASSIUM: 4 meq/L (ref 3.5–5.1)
Sodium: 140 mEq/L (ref 135–145)
TOTAL PROTEIN: 8 g/dL (ref 6.0–8.3)
Total Bilirubin: 0.5 mg/dL (ref 0.2–1.2)

## 2016-07-06 LAB — HIV ANTIBODY (ROUTINE TESTING W REFLEX): HIV: NONREACTIVE

## 2016-07-06 NOTE — Patient Instructions (Addendum)
We will recheck echo of heart Check blood tests including A1AT levels and phenotype, HIV rest, Comprehensive metabolic panel, ANA with reflex and rheumatoid factor  Continue spiriva Return in 3 months

## 2016-07-06 NOTE — Progress Notes (Signed)
Dquan Cortopassi    [id number]    05-04-1960  Primary Care Physician:FUNCHES, Lennox Laity, MD  Referring Physician: Boykin Nearing, MD [address] [place], [place] [address]  Chief complaint:   Follow up for COPD GOLD B (Cat score 17, 0 exacerbations) Pulmonary HTN  HPI: Mr. Kleinfelter is a 56 year old with extensive smoking history. His main complaint is dyspnea on exertion for the past several years. He has chronic cough with mucus production. Denies any wheezing, hemoptysis. He was maintained initially on just albuterol rescue inhaler. He is maintained on spiriva. He feels very currently. He has dyspnea on exertion which is not different from baseline. Denies any cough, sputum production, hemoptysis, fever, weight loss.  Interim history: He feels about the same with dyspnea on exertion, cough with sputum production. He has not had an exacerbation since his last visit. Denies any cough, sputum production, hemoptysis, fevers, weight loss.  Outpatient Encounter Prescriptions as of 07/06/2016  Medication Sig  . albuterol (PROVENTIL HFA;VENTOLIN HFA) 108 (90 Base) MCG/ACT inhaler Inhale 2 puffs into the lungs every 4 (four) hours as needed for wheezing or shortness of breath.  Marland Kitchen albuterol (PROVENTIL) (2.5 MG/3ML) 0.083% nebulizer solution Take 3 mLs (2.5 mg total) by nebulization every 6 (six) hours as needed for wheezing or shortness of breath.  . allopurinol (ZYLOPRIM) 100 MG tablet TAKE 1 TABLET BY MOUTH DAILY.  Marland Kitchen amLODipine (NORVASC) 10 MG tablet Take 1 tablet (10 mg total) by mouth daily.  . Cyanocobalamin (VITAMIN B-12) 1000 MCG SUBL Place 1 tablet (1,000 mcg total) under the tongue daily.  Marland Kitchen gabapentin (NEURONTIN) 300 MG capsule TAKE ONE CAPSULE BY MOUTH AT BEDTIME  . losartan (COZAAR) 50 MG tablet Take 1 tablet (50 mg total) by mouth daily.  . metoprolol succinate (TOPROL-XL) 50 MG 24 hr tablet Take 1 tablet (50 mg total) by mouth daily.  Marland Kitchen SPIRIVA HANDIHALER 18 MCG inhalation  capsule PLACE 1 CAPSULE INTO INHALER AND INHALE DAILY  . [DISCONTINUED] diphenhydrAMINE (BENADRYL) 25 MG tablet Take 50 mg by mouth once as needed for itching or allergies.   No facility-administered encounter medications on file as of 07/06/2016.     Allergies as of 07/06/2016 - Review Complete 07/06/2016  Allergen Reaction Noted  . Atorvastatin Itching and Swelling 10/30/2014  . Shellfish allergy Anaphylaxis 07/28/2012  . Colchicine Hives 10/13/2014    Past Medical History:  Diagnosis Date  . Asthma 1999  . 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 COPD (chronic obstructive pulmonary disease) ([place]) 12/15/2013  . Essential hypertension   . Gout   . Influenza with respiratory manifestations 07/30/2012  . Pneumonia   . Thrombocytopenia ([place]) 07/28/2012  . Tobacco abuse 07/29/2012    Past Surgical History:  Procedure Laterality Date  . FRACTURE SURGERY  childhood   right arm  . KNEE SURGERY     right knee s/p trauma    Family History  Problem Relation Age of Onset  . Colon cancer      mother ? age of diagnosis   . Cancer Mother     colon   . Heart disease Father   . Healthy Son   . Healthy Daughter     Social History   Social History  . Marital status: Legally Separated    Spouse name: N/A  . Number of children: 5   . Years of education: 61   Occupational History  . Unemployed     Social History  Main Topics  . Smoking status: Former Smoker    Packs/day: 1.00    Years: 20.00    Quit date: 12/15/2013  . Smokeless tobacco: Never Used     Comment: 'Quitting"  . Alcohol use 0.0 oz/week     Comment: occaisional beer - previously one beer daily, stopped 4 months  . Drug use: No     Comment: 10/13/14: last use of cocaine one month ago   . Sexual activity: Yes    Birth control/ protection: Condom   Other Topics Concern  . Not on file   Social History Narrative   Lives with daughter and grandkids x 2. 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    Smokes 1ppd since age 55 y.o    Drinks 2 times per week 40 oz beer. Denies liquor, wine. History of cocaine use. Denies IVDU   Has an orange card    Sexually active with 1 partner uses protection                Review of systems: Review of Systems  Constitutional: Negative for fever and chills.  HENT: Negative.   Eyes: Negative for blurred vision.  Respiratory: as per HPI  Cardiovascular: Negative for chest pain and palpitations.  Gastrointestinal: Negative for vomiting, diarrhea, blood per rectum. Genitourinary: Negative for dysuria, urgency, frequency and hematuria.  Musculoskeletal: Negative for myalgias, back pain and joint pain.  Skin: Negative for itching and rash.  Neurological: Negative for dizziness, tremors, focal weakness, seizures and loss of consciousness.  Endo/Heme/Allergies: Negative for environmental allergies.  Psychiatric/Behavioral: Negative for depression, suicidal ideas and hallucinations.  All other systems reviewed and are negative.  Physical Exam: Blood pressure (!) 144/88, pulse 89, height 5\' 7"  (1.702 m), weight 229 lb 3.2 oz (104 kg), SpO2 93 %. Gen:      No acute distress HEENT:  EOMI, sclera anicteric Neck:     No masses; no thyromegaly Lungs:    Clear to auscultation bilaterally; normal respiratory effort CV:         Regular rate and rhythm; no murmurs Abd:      + bowel sounds; soft, non-tender; no palpable masses, no distension Ext:    No edema; adequate peripheral perfusion Skin:      Warm and dry; no rash Neuro: alert and oriented x 3 Psych: normal mood and affect  Data Reviewed: Chest x-ray 10/26/14. Emphysema. No focal consolidation or pneumothorax.  Echo 10/27/14 Normal LV function; mild LVH and LVE; grade 1 diastolic dysfunction; moderate LAE; mild RAE; trace TR; severely elevated pulmonary pressure. PAP 72  CTA scan 12/13/13 1. No pulmonary embolism. Severe  centrilobular emphysema. 2. Mild airway thickening favoring the lower lobes, possibly a manifestation of bronchitis or reactive airways disease. 3. Mild atelectasis in both lower lobes in the lingula. 4. Cardiomegaly, with particular right heart enlargement.  PFTs. 4/13/17VC 2.09 [PERCENT) FEV1 1.04 (36%) F/F 50 DLCO 38% Severe obstructive lung disease with reduction in diffusion capacity.  Sleep study 06/15/15 No significant obstructive sleep apnea, moderate O2 desaturation  Assessment:  #1 COPD Mr. Akhtar has severe COPD based on PFTs. He is stable on Spiriva and will continue the same along with albuterol rescue inhaler.  Check A1AT leves  #2 Severe pulmonary hypertension. 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 He does not have sleep apnea but noted  to have significant desaturations even on 2 L oxygen. We have increased his O2 to 4 lt with rest and 8 lt with exercise. We will recheck echo. Suspicion for PAH is low but we will check for PAH etiology including HIV, LFTs, ANA, RF, CCP.  #3 Heavy ex-smoker. He is a candidate for low-dose screening CT. I will discuss with him at his next visit.  Plan/Recommendations: - Continue spiriva - Echocardiogram - Continue supplemental o2 - check A1AT levels - Lab workup for pulmonary HTN  [name] MD [facility] Pager [phone] 07/06/2016, 2:04 PM  CC: Boykin Nearing, MD

## 2016-07-07 LAB — ENA+DNA/DS+SJORGEN'S
ENA RNP Ab: 0.2 AI (ref 0.0–0.9)
ENA SSA (RO) AB: 0.2 AI (ref 0.0–0.9)
ENA SSB (LA) AB: 7.9 AI — AB (ref 0.0–0.9)
dsDNA Ab: <1 IU/mL (ref 0–9)

## 2016-07-07 LAB — RHEUMATOID FACTOR: Rhuematoid fact SerPl-aCnc: <14 IU/mL (ref <14)

## 2016-07-07 LAB — ANA W/REFLEX: Anti Nuclear Antibody(ANA): POSITIVE — AB

## 2016-07-12 LAB — ALPHA-1 ANTITRYPSIN PHENOTYPE: A-1 Antitrypsin: 137 mg/dL (ref 83–199)

## 2016-07-21 ENCOUNTER — Other Ambulatory Visit: Payer: Self-pay

## 2016-07-21 ENCOUNTER — Ambulatory Visit (HOSPITAL_COMMUNITY): Payer: Medicaid Other | Attending: Cardiovascular Disease

## 2016-07-21 DIAGNOSIS — I071 Rheumatic tricuspid insufficiency: Secondary | ICD-10-CM | POA: Insufficient documentation

## 2016-07-21 DIAGNOSIS — I272 Pulmonary hypertension, unspecified: Secondary | ICD-10-CM | POA: Insufficient documentation

## 2016-08-01 ENCOUNTER — Other Ambulatory Visit: Payer: Self-pay | Admitting: Family Medicine

## 2016-08-01 MED FILL — METOPROLOL SUCC ER 50 MG TA: 50 | 30 days supply | Qty: 30 | Fill #8

## 2016-08-01 MED FILL — LOSARTAN POTASSIUM 50 MG TA: 50 | 30 days supply | Qty: 30 | Fill #3

## 2016-08-01 MED FILL — PROAIR HFA 90 MCG INHALER: 108 (90 BAS | 16 days supply | Qty: 9 | Fill #6

## 2016-08-01 MED FILL — AMLODIPINE BESYLATE 10 MG T: 10 | 30 days supply | Qty: 30 | Fill #9

## 2016-08-02 MED FILL — SPIRIVA 18 MCG CP-HANDIHALE: 18 | 30 days supply | Qty: 30 | Fill #4

## 2016-08-02 MED FILL — ALLOPURINOL 100 MG TABLET: 100 | 30 days supply | Qty: 30 | Fill #0

## 2016-08-23 ENCOUNTER — Encounter: Payer: Self-pay | Admitting: Family Medicine

## 2016-09-05 ENCOUNTER — Other Ambulatory Visit: Payer: Self-pay | Admitting: Family Medicine

## 2016-09-05 MED FILL — AMLODIPINE BESYLATE 10 MG T: 10 | 30 days supply | Qty: 30 | Fill #10

## 2016-09-05 MED FILL — SPIRIVA 18 MCG CP-HANDIHALE: 18 | 30 days supply | Qty: 30 | Fill #5

## 2016-09-05 MED FILL — METOPROLOL SUCC ER 50 MG TA: 50 | 30 days supply | Qty: 30 | Fill #9

## 2016-09-05 MED FILL — LOSARTAN POTASSIUM 50 MG TA: 50 | 30 days supply | Qty: 30 | Fill #4

## 2016-09-14 ENCOUNTER — Telehealth: Payer: Self-pay | Admitting: Pulmonary Disease

## 2016-09-14 NOTE — Telephone Encounter (Signed)
  Notes recorded by [name], MD on 09/07/2016 at 8:30 AM EDT Let the patient know that the pulmonary hypertension has improved since last test. Continue current therapy and follow up in clinic There is some stiffening of the heart that we will continue to monitor.  ------------ Spoke with pt, aware of results/recs.  Nothing further needed.

## 2016-10-02 ENCOUNTER — Ambulatory Visit: Payer: Self-pay | Admitting: Acute Care

## 2016-10-03 ENCOUNTER — Other Ambulatory Visit: Payer: Self-pay | Admitting: Family Medicine

## 2016-10-03 DIAGNOSIS — I10 Essential (primary) hypertension: Secondary | ICD-10-CM

## 2016-10-03 MED FILL — METOPROLOL SUCC ER 50 MG TA: 50 | 30 days supply | Qty: 30 | Fill #0

## 2016-10-03 MED FILL — LOSARTAN POTASSIUM 50 MG TA: 50 | 30 days supply | Qty: 30 | Fill #0

## 2016-10-03 MED FILL — SPIRIVA 18 MCG CP-HANDIHALE: 18 | 30 days supply | Qty: 30 | Fill #6

## 2016-10-03 MED FILL — ALBUTEROL 0.083% INHAL SOLN: (2.5 MG/3ML | 7 days supply | Qty: 90 | Fill #0

## 2016-10-03 MED FILL — ALLOPURINOL 100 MG TABLET: 100 | 30 days supply | Qty: 30 | Fill #0

## 2016-10-03 MED FILL — GABAPENTIN 300 MG CAPSULE: 300 | 30 days supply | Qty: 30 | Fill #0

## 2016-10-03 MED FILL — AMLODIPINE BESYLATE 10 MG T: 10 | 30 days supply | Qty: 30 | Fill #0

## 2016-10-03 MED FILL — PROAIR HFA 90 MCG INHALER: 108 (90 BAS | 16 days supply | Qty: 9 | Fill #0

## 2016-10-05 ENCOUNTER — Ambulatory Visit: Payer: Self-pay | Admitting: Acute Care

## 2016-10-30 ENCOUNTER — Ambulatory Visit (INDEPENDENT_AMBULATORY_CARE_PROVIDER_SITE_OTHER): Payer: Medicaid Other | Admitting: Acute Care

## 2016-10-30 ENCOUNTER — Encounter: Payer: Self-pay | Admitting: Acute Care

## 2016-10-30 DIAGNOSIS — J449 Chronic obstructive pulmonary disease, unspecified: Secondary | ICD-10-CM | POA: Insufficient documentation

## 2016-10-30 DIAGNOSIS — J9611 Chronic respiratory failure with hypoxia: Secondary | ICD-10-CM | POA: Insufficient documentation

## 2016-10-30 NOTE — Assessment & Plan Note (Addendum)
Currently at baseline Plan Your COPD is at baseline Continue your Spiriva daily Rinse mouth after use Continue your albuterol inhalers and nebs as needed for shortness of breath. Work on weight loss Continue to wear oxygen to maintain saturations 88-92% Consider getting an oxygen saturation probe. Add Zyrtec or Claritin for post nasal drip. Add Flonase 2 puffs up each nostril once daily for sinus congestion. Follow up in 3 months with Dr. Vaughan Browner Please contact office for sooner follow up if symptoms do not improve or worsen or seek emergency care  Patient is not interested in pulmonary rehabilitation Patient was reminded to get flu shot in fall, and stated that he will not get flu shot as each time he did he got sick.

## 2016-10-30 NOTE — Progress Notes (Signed)
History of Present Illness [name] [name] is a 56 y.o. male former smokerwith Gold B ( CAT score 17), and pulmonary HTN. He is oxygen dependent , and has a history of cocaine abuse. He is followed by Dr. Vaughan Browner.  [name] [name] is a 56 year old with extensive smoking history. His main complaint is dyspnea on exertion for the past several years. He has chronic cough with mucus production. Denies any wheezing, hemoptysis. He was maintained initially on just albuterol rescue inhaler. He is maintained on spiriva. He feels very currently. He has dyspnea on exertion which is not different from baseline. Denies any cough, sputum production, hemoptysis, fever, weight loss.   10/30/2016 3 month Follow up: Pt presents for follow up. He states his dyspnea is at his baseline. He is compliant with his Spiriva and his nebulizer treatments. He states he is using his nebs about 2 times a week, and otherwise uses his inhaler.He has had no exacerbations since last visit. He denies fever, chest pain orthopnea or hemoptysis. He states he is at his baseline. Secretions and cough are at baseline.   Test Results: Chest x-ray 10/26/14. Emphysema. No focal consolidation or pneumothorax.  Echo 10/20/2016  EF 60-65% PAP 42 mm Hg( improved over previous reading of 72 mmHg ) Grade 1 diastolic dysfunction  CTA scan 12/13/13 1. No pulmonary embolism. Severe centrilobular emphysema. 2. Mild airway thickening favoring the lower lobes, possibly a manifestation of bronchitis or reactive airways disease. 3. Mild atelectasis in both lower lobes in the lingula. 4. Cardiomegaly, with particular right heart enlargement.  PFTs. 4/13/17VC 2.09 [PERCENT) FEV1 1.04 (36%) F/F 50 DLCO 38% Severe obstructive lung disease with reduction in diffusion capacity.  Sleep study 06/15/15 No significant obstructive sleep apnea, moderate O2 desaturation CBC Latest Ref Rng & Units 10/26/2014 10/13/2014 08/20/2014  WBC 4.0 - 10.5 K/uL 13.6(H) 9.9 7.5   Hemoglobin 13.0 - 17.0 g/dL 15.2 14.1 14.3  Hematocrit 39.0 - 52.0 % 46.6 45.4 45.5  Platelets 150 - 400 K/uL 209 278 219    BMP Latest Ref Rng & Units 07/06/2016 09/17/2015 10/26/2014  Glucose 70 - 99 mg/dL 75 84 73  BUN 6 - 23 mg/dL 9 11 17   Creatinine 0.40 - 1.50 mg/dL 1.29 1.50(H) 1.34(H)  Sodium 135 - 145 mEq/L 140 140 135  Potassium 3.5 - 5.1 mEq/L 4.0 4.9 4.4  Chloride 96 - 112 mEq/L 101 100 99(L)  CO2 19 - 32 mEq/L 36(H) 34(H) 26  Calcium 8.4 - 10.5 mg/dL 9.8 10.1 9.2     ProBNP    Component Value Date/Time   PROBNP 2,989.0 (H) 12/11/2013 0130    PFT    Component Value Date/Time   FEV1PRE 1.04 07/22/2015 1447   FEV1POST 1.07 07/22/2015 1447   FVCPRE 2.09 07/22/2015 1447   FVCPOST 2.19 07/22/2015 1447   DLCOUNC 10.99 07/22/2015 1447   PREFEV1FVCRT 50 07/22/2015 1447   PSTFEV1FVCRT 49 07/22/2015 1447    No results found.   Past medical hx Past Medical History:  Diagnosis Date  . Asthma 1999  . 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 COPD (chronic obstructive pulmonary disease) ([place]) 12/15/2013  . Essential hypertension   . Gout   . Influenza with respiratory manifestations 07/30/2012  . Pneumonia   . Thrombocytopenia ([place]) 07/28/2012  . Tobacco abuse 07/29/2012     Social History  Substance Use Topics  . Smoking status: Former Smoker    Packs/day: 1.00    Years: 20.00    Quit date: 12/15/2013  .  Smokeless tobacco: Never Used     Comment: 'Quitting"  . Alcohol use 0.0 oz/week     Comment: occaisional beer - previously one beer daily, stopped 4 months    Tobacco Cessation: Former smoker quit 2015  Past surgical hx, Family hx, Social hx all reviewed.  Current Outpatient Prescriptions on File Prior to Visit  Medication Sig  . albuterol (PROVENTIL) (2.5 MG/3ML) 0.083% nebulizer solution TAKE 3 MLS BY NEBULIZATION EVERY 6 HOURS AS NEEDED FOR WHEEZING OR SHORTNESS OF BREATH.  Marland Kitchen allopurinol (ZYLOPRIM) 100 MG tablet Take 1 tablet (100 mg  total) by mouth daily. Office visit needed  . amLODipine (NORVASC) 10 MG tablet TAKE 1 TABLET BY MOUTH DAILY.  Marland Kitchen Cyanocobalamin (VITAMIN B-12) 1000 MCG SUBL Place 1 tablet (1,000 mcg total) under the tongue daily.  Marland Kitchen gabapentin (NEURONTIN) 300 MG capsule Take 1 capsule (300 mg total) by mouth at bedtime. Office visit needed  . losartan (COZAAR) 50 MG tablet TAKE 1 TABLET BY MOUTH DAILY.  . metoprolol succinate (TOPROL-XL) 50 MG 24 hr tablet TAKE 1 TABLET BY MOUTH DAILY  . PROAIR HFA 108 (90 Base) MCG/ACT inhaler INHALE 2 PUFFS INTO THE LUNGS EVERY 4 HOURS AS NEEDED FOR WHEEZING OR SHORTNESS OF BREATH.  Marland Kitchen SPIRIVA HANDIHALER 18 MCG inhalation capsule PLACE 1 CAPSULE INTO INHALER AND INHALE DAILY  . [DISCONTINUED] ALBUTEROL IN Inhale into the lungs.   No current facility-administered medications on file prior to visit.      Allergies  Allergen Reactions  . Atorvastatin Itching and Swelling    Facial, tongue swelling  . Shellfish Allergy Anaphylaxis  . Colchicine Hives    Review Of Systems:  Constitutional:   No  weight loss, night sweats,  Fevers, chills, fatigue, or  lassitude.  HEENT:   No headaches,  Difficulty swallowing,  Tooth/dental problems, or  Sore throat,                No sneezing, itching, ear ache, nasal congestion, post nasal drip,   CV:  No chest pain,  Orthopnea, PND, swelling in lower extremities, anasarca, dizziness, palpitations, syncope.   GI  No heartburn, indigestion, abdominal pain, nausea, vomiting, diarrhea, change in bowel habits, loss of appetite, bloody stools.   Resp: + shortness of breath with exertion or at rest.  No excess mucus, no productive cough,  No non-productive cough,  No coughing up of blood.  No change in color of mucus.  No wheezing.  No chest wall deformity  Skin: no rash or lesions.  GU: no dysuria, change in color of urine, no urgency or frequency.  No flank pain, no hematuria   MS:  No joint pain or swelling.  No decreased range of  motion.  No back pain.  Psych:  No change in mood or affect. No depression or anxiety.  No memory loss.   Vital Signs BP 126/80 (BP Location: Left Arm, Patient Position: Sitting, Cuff Size: Normal)   Pulse 95   Ht 5\' 7"  (1.702 m)   Wt 233 lb (105.7 kg)   SpO2 95% Comment: 4LPM after resting  BMI 36.49 kg/m    Physical Exam:  General- No distress,  A&Ox3, pleasant ENT: No sinus tenderness, TM clear, pale nasal mucosa, no oral exudate,no post nasal drip, no LAN Cardiac: S1, S2, regular rate and rhythm, no murmur Chest: No wheeze/ rales/ diminished in the bases with scattered rhonchi; no accessory muscle use, no nasal flaring, no sternal retractions Abd.: Soft Non-tender, nondistended, obese Ext: No  clubbing cyanosis, edema Neuro: Deconditioned at baseline Skin: No rashes, warm and dry Psych: normal mood and behavior   Assessment/Plan  COPD without exacerbation (HCC) Currently at baseline Plan Your COPD is at baseline Continue your Spiriva daily Rinse mouth after use Continue your albuterol inhalers and nebs as needed for shortness of breath. Work on weight loss Continue to wear oxygen to maintain saturations 88-92% Consider getting an oxygen saturation probe. Add Zyrtec or Claritin for post nasal drip. Add Flonase 2 puffs up each nostril once daily for sinus congestion. Follow up in 3 months with Dr. Vaughan Browner Please contact office for sooner follow up if symptoms do not improve or worsen or seek emergency care  Patient is not interested in pulmonary rehabilitation Patient was reminded to get flu shot in fall, and stated that he will not get flu shot as each time he did he got sick.  Chronic respiratory failure with hypoxia (HCC) Continue wearing oxygen at 8 L with ambulation, 4 L at rest to maintain oxygen saturations 88-92 percent    Magdalen Spatz, NP 10/30/2016  4:07 PM

## 2016-10-30 NOTE — Assessment & Plan Note (Signed)
Continue wearing oxygen at 8 L with ambulation, 4 L at rest to maintain oxygen saturations 88-92 percent

## 2016-10-30 NOTE — Patient Instructions (Addendum)
It is good to see you today. Your COPD is at baseline Continue your Spiriva daily Rinse mouth after use Continue your albuterol inhalers and nebs as needed for shortness of breath. Work on weight loss Continue to wear oxygen to maintain saturations 88-92% Consider getting an oxygen saturation probe. Add Zyrtec or Claritin for post nasal drip. Add Flonase 2 puffs up each nostril once daily for sinus congestion. Follow up in 3 months with Dr. Vaughan Browner Please contact office for sooner follow up if symptoms do not improve or worsen or seek emergency care

## 2016-11-06 ENCOUNTER — Ambulatory Visit: Payer: Medicaid Other | Attending: Family Medicine | Admitting: Family Medicine

## 2016-11-06 ENCOUNTER — Encounter: Payer: Self-pay | Admitting: Family Medicine

## 2016-11-06 VITALS — BP 133/77 | HR 78 | Temp 97.6°F | Ht 67.0 in | Wt 235.2 lb

## 2016-11-06 DIAGNOSIS — R21 Rash and other nonspecific skin eruption: Secondary | ICD-10-CM | POA: Diagnosis not present

## 2016-11-06 DIAGNOSIS — B352 Tinea manuum: Secondary | ICD-10-CM

## 2016-11-06 DIAGNOSIS — B353 Tinea pedis: Secondary | ICD-10-CM

## 2016-11-06 DIAGNOSIS — Z79899 Other long term (current) drug therapy: Secondary | ICD-10-CM | POA: Diagnosis not present

## 2016-11-06 DIAGNOSIS — J449 Chronic obstructive pulmonary disease, unspecified: Secondary | ICD-10-CM | POA: Insufficient documentation

## 2016-11-06 DIAGNOSIS — L301 Dyshidrosis [pompholyx]: Secondary | ICD-10-CM | POA: Insufficient documentation

## 2016-11-06 DIAGNOSIS — Z87891 Personal history of nicotine dependence: Secondary | ICD-10-CM | POA: Diagnosis not present

## 2016-11-06 DIAGNOSIS — B351 Tinea unguium: Secondary | ICD-10-CM | POA: Insufficient documentation

## 2016-11-06 MED ORDER — PREDNISONE 10 MG PO TABS: ORAL_TABLET | ORAL | 0 refills | Status: DC

## 2016-11-06 MED ORDER — TERBINAFINE HCL 250 MG PO TABS: 250.0000 mg | ORAL_TABLET | Freq: Every day | ORAL | 2 refills | Status: DC

## 2016-11-06 MED ORDER — TRIAMCINOLONE ACETONIDE 0.1 % EX OINT: 1.0000 "application " | TOPICAL_OINTMENT | Freq: Two times a day (BID) | CUTANEOUS | 1 refills | Status: DC

## 2016-11-06 MED FILL — TRIAMCINOLONE 0.1% OINTMENT: 0.1 | 15 days supply | Qty: 80 | Fill #0

## 2016-11-06 MED FILL — predniSONE 10 MG TABS: 10 | 9 days supply | Qty: 21 | Fill #0

## 2016-11-06 NOTE — Patient Instructions (Signed)
[name] [name] was seen today for rash.  Diagnoses and all orders for this visit:  Dyshidrotic eczema -     predniSONE (DELTASONE) 10 MG tablet; Take by mouth 4 tablets for 2 days, 3 tablets for 2 days, 2 tablets for 2 days, 1 tablet for 3 days then STOP -     triamcinolone ointment (KENALOG) 0.1 %; Apply 1 application topically 2 (two) times daily.  Other orders -     Discontinue: terbinafine (LAMISIL) 250 MG tablet; Take 1 tablet (250 mg total) by mouth daily. For 12 weeks -     Cancel: Ambulatory referral to Dermatology   Take prednisone taper Apply kenalog twice daily  F/u in 6 weeks for rash and flu shot  Dr. Adrian Blackwater    Hand Dermatitis Hand dermatitis is a skin condition. It causes small, itchy, raised dots or fluid-filled blisters to form on the palms of the hands. This condition may also be called hand eczema. Follow these instructions at home:  Take or apply over-the-counter and prescription medicines only as told by your doctor.  If you were prescribed an antibiotic medicine, use it as told by your doctor. Do not stop using the antibiotic even if you start to feel better.  Avoid washing your hands more often than you need to.  Avoid using harsh chemicals on your hands.  Wear gloves that protect your hands when you handle products that can bother (irritate) your skin.  Keep all follow-up visits as told by your doctor. This is important. Contact a doctor if:  Your rash is not better after one week of treatment.  Your rash is red.  Your rash is tender.  Your rash has pus coming from it.  Your rash spreads. This information is not intended to replace advice given to you by your health care provider. Make sure you discuss any questions you have with your health care provider. Document Released: 06/21/2009 Document Revised: 09/02/2015 Document Reviewed: 10/09/2014 Elsevier Interactive Patient Education  [employer].

## 2016-11-06 NOTE — Progress Notes (Signed)
Subjective:  Patient ID: [name] [name], male    DOB: 03-27-1961  Age: 56 y.o. MRN: [id number]  CC: Rash (hands and feet)   HPI Marston Mccadden has end stage COPD he presents for   1. Rash of plans and soles: for 3 years. Worsening. Mildly pruritic. Thick scales. Dark plaques on back of elbows.  Social History  Substance Use Topics  . Smoking status: Former Smoker    Packs/day: 1.00    Years: 20.00    Quit date: 12/15/2013  . Smokeless tobacco: Never Used     Comment: 'Quitting"  . Alcohol use 0.0 oz/week     Comment: occaisional beer - previously one beer daily, stopped 4 months    Outpatient Medications Prior to Visit  Medication Sig Dispense Refill  . albuterol (PROVENTIL) (2.5 MG/3ML) 0.083% nebulizer solution TAKE 3 MLS BY NEBULIZATION EVERY 6 HOURS AS NEEDED FOR WHEEZING OR SHORTNESS OF BREATH. 90 mL 0  . allopurinol (ZYLOPRIM) 100 MG tablet Take 1 tablet (100 mg total) by mouth daily. Office visit needed 30 tablet 0  . amLODipine (NORVASC) 10 MG tablet TAKE 1 TABLET BY MOUTH DAILY. 30 tablet 0  . Cyanocobalamin (VITAMIN B-12) 1000 MCG SUBL Place 1 tablet (1,000 mcg total) under the tongue daily. 30 tablet 11  . DiphenhydrAMINE HCl (BENADRYL ALLERGY PO) Take by mouth.    . gabapentin (NEURONTIN) 300 MG capsule Take 1 capsule (300 mg total) by mouth at bedtime. Office visit needed 30 capsule 0  . losartan (COZAAR) 50 MG tablet TAKE 1 TABLET BY MOUTH DAILY. 30 tablet 0  . metoprolol succinate (TOPROL-XL) 50 MG 24 hr tablet TAKE 1 TABLET BY MOUTH DAILY 30 tablet 0  . PROAIR HFA 108 (90 Base) MCG/ACT inhaler INHALE 2 PUFFS INTO THE LUNGS EVERY 4 HOURS AS NEEDED FOR WHEEZING OR SHORTNESS OF BREATH. 1 Inhaler 0  . SPIRIVA HANDIHALER 18 MCG inhalation capsule PLACE 1 CAPSULE INTO INHALER AND INHALE DAILY 90 capsule 4   No facility-administered medications prior to visit.     ROS Review of Systems  Constitutional: Negative for chills, fatigue, fever and unexpected weight change.    Eyes: Negative for visual disturbance.  Respiratory: Negative for cough and shortness of breath.   Cardiovascular: Negative for chest pain, palpitations and leg swelling.  Gastrointestinal: Negative for abdominal pain, blood in stool, constipation, diarrhea, nausea and vomiting.  Endocrine: Negative for polydipsia, polyphagia and polyuria.  Musculoskeletal: Negative for arthralgias, back pain, gait problem, myalgias and neck pain.  Skin: Positive for rash.  Allergic/Immunologic: Negative for immunocompromised state.  Hematological: Negative for adenopathy. Does not bruise/bleed easily.  Psychiatric/Behavioral: Negative for dysphoric mood, sleep disturbance and suicidal ideas. The patient is not nervous/anxious.     Objective:  BP 133/77   Pulse 78   Temp 97.6 F (36.4 C) (Oral)   Ht 5\' 7"  (1.702 m)   Wt 235 lb 3.2 oz (106.7 kg)   SpO2 94%   BMI 36.84 kg/m   BP/Weight 11/06/2016 10/30/2016 9/73/5329  Systolic BP 924 268 341  Diastolic BP 77 80 88  Wt. (Lbs) 235.2 233 229.2  BMI 36.84 36.49 35.9    Physical Exam  Constitutional: He appears well-developed and well-nourished. No distress.  Nasal canula in place   HENT:  Head: Normocephalic and atraumatic.  Neck: Normal range of motion. Neck supple.  Cardiovascular: Normal rate, regular rhythm, normal heart sounds and intact distal pulses.   Pulmonary/Chest: Effort normal.  Musculoskeletal: He exhibits no edema.  Neurological:  He is alert.  Skin: Skin is warm and dry. No rash noted. No erythema.     Psychiatric: He has a normal mood and affect.   Assessment & Plan:  Collis was seen today for rash.  Diagnoses and all orders for this visit:  Dyshidrotic eczema -     predniSONE (DELTASONE) 10 MG tablet; Take by mouth 4 tablets for 2 days, 3 tablets for 2 days, 2 tablets for 2 days, 1 tablet for 3 days then STOP -     triamcinolone ointment (KENALOG) 0.1 %; Apply 1 application topically 2 (two) times daily.  Other  orders -     Discontinue: terbinafine (LAMISIL) 250 MG tablet; Take 1 tablet (250 mg total) by mouth daily. For 12 weeks -     Cancel: Ambulatory referral to Dermatology   There are no diagnoses linked to this encounter.  No orders of the defined types were placed in this encounter.   Follow-up: Return in about 6 weeks (around 12/18/2016) for palm and soles rash and flu shot.   Boykin Nearing MD

## 2016-11-07 ENCOUNTER — Other Ambulatory Visit: Payer: Self-pay | Admitting: Family Medicine

## 2016-11-07 DIAGNOSIS — I10 Essential (primary) hypertension: Secondary | ICD-10-CM

## 2016-11-07 MED FILL — ALLOPURINOL 100 MG TABLET: 100 | 30 days supply | Qty: 30 | Fill #0

## 2016-11-07 MED FILL — METOPROLOL SUCC ER 50 MG TA: 50 | 30 days supply | Qty: 30 | Fill #0

## 2016-11-07 MED FILL — GABAPENTIN 300 MG CAPSULE: 300 | 30 days supply | Qty: 30 | Fill #0

## 2016-11-07 MED FILL — LOSARTAN POTASSIUM 50 MG TA: 50 | 30 days supply | Qty: 30 | Fill #0

## 2016-11-07 MED FILL — SPIRIVA 18 MCG CP-HANDIHALE: 18 | 30 days supply | Qty: 30 | Fill #7

## 2016-11-07 MED FILL — AMLODIPINE BESYLATE 10 MG T: 10 | 30 days supply | Qty: 30 | Fill #0

## 2016-11-07 MED FILL — PROAIR HFA 90 MCG INHALER: 108 (90 BAS | 16 days supply | Qty: 9 | Fill #0

## 2016-11-08 DIAGNOSIS — L301 Dyshidrosis [pompholyx]: Secondary | ICD-10-CM | POA: Insufficient documentation

## 2016-11-08 NOTE — Assessment & Plan Note (Signed)
Thick scaly rash on palms and soles There is hyperpigmentation on extensor surface of elbows Most likely dyshidrotic eczema  Plan: Prednisone taper kenalog 0.1% ointment Consider tinea manus and pedis if symptoms worsen and treat with Lamisil

## 2016-11-24 ENCOUNTER — Other Ambulatory Visit: Payer: Self-pay | Admitting: Family Medicine

## 2016-11-24 DIAGNOSIS — L301 Dyshidrosis [pompholyx]: Secondary | ICD-10-CM

## 2016-11-24 MED FILL — TRIAMCINOLONE 0.1% OINTMENT: 0.1 | 15 days supply | Qty: 80 | Fill #1

## 2016-12-13 ENCOUNTER — Other Ambulatory Visit: Payer: Self-pay

## 2016-12-13 MED ORDER — ALBUTEROL SULFATE (2.5 MG/3ML) 0.083% IN NEBU: INHALATION_SOLUTION | RESPIRATORY_TRACT | 0 refills | Status: DC

## 2016-12-13 MED FILL — GABAPENTIN 300 MG CAPSULE: 300 | 30 days supply | Qty: 30 | Fill #1

## 2016-12-13 MED FILL — ALBUTEROL 0.083% INHAL SOLN: (2.5 MG/3ML | 7 days supply | Qty: 90 | Fill #0

## 2016-12-13 MED FILL — LOSARTAN POTASSIUM 50 MG TA: 50 | 30 days supply | Qty: 30 | Fill #1

## 2016-12-13 MED FILL — ALLOPURINOL 100 MG TABLET: 100 | 30 days supply | Qty: 30 | Fill #1

## 2016-12-13 MED FILL — AMLODIPINE BESYLATE 10 MG T: 10 | 30 days supply | Qty: 30 | Fill #1

## 2016-12-13 MED FILL — METOPROLOL SUCC ER 50 MG TA: 50 | 30 days supply | Qty: 30 | Fill #1

## 2016-12-13 MED FILL — SPIRIVA 18 MCG CP-HANDIHALE: 18 | 30 days supply | Qty: 30 | Fill #8

## 2016-12-18 ENCOUNTER — Ambulatory Visit: Payer: Medicaid Other | Attending: Family Medicine | Admitting: Family Medicine

## 2016-12-18 ENCOUNTER — Encounter: Payer: Self-pay | Admitting: Family Medicine

## 2016-12-18 VITALS — BP 120/77 | HR 83 | Temp 97.8°F | Ht 67.0 in | Wt 234.4 lb

## 2016-12-18 DIAGNOSIS — G5623 Lesion of ulnar nerve, bilateral upper limbs: Secondary | ICD-10-CM

## 2016-12-18 DIAGNOSIS — M1A9XX0 Chronic gout, unspecified, without tophus (tophi): Secondary | ICD-10-CM | POA: Diagnosis not present

## 2016-12-18 DIAGNOSIS — I129 Hypertensive chronic kidney disease with stage 1 through stage 4 chronic kidney disease, or unspecified chronic kidney disease: Secondary | ICD-10-CM | POA: Diagnosis not present

## 2016-12-18 DIAGNOSIS — L301 Dyshidrosis [pompholyx]: Secondary | ICD-10-CM | POA: Diagnosis not present

## 2016-12-18 DIAGNOSIS — J9611 Chronic respiratory failure with hypoxia: Secondary | ICD-10-CM | POA: Insufficient documentation

## 2016-12-18 DIAGNOSIS — N182 Chronic kidney disease, stage 2 (mild): Secondary | ICD-10-CM | POA: Diagnosis not present

## 2016-12-18 DIAGNOSIS — J449 Chronic obstructive pulmonary disease, unspecified: Secondary | ICD-10-CM | POA: Diagnosis not present

## 2016-12-18 DIAGNOSIS — Z79899 Other long term (current) drug therapy: Secondary | ICD-10-CM | POA: Diagnosis not present

## 2016-12-18 DIAGNOSIS — R21 Rash and other nonspecific skin eruption: Secondary | ICD-10-CM | POA: Diagnosis present

## 2016-12-18 DIAGNOSIS — I10 Essential (primary) hypertension: Secondary | ICD-10-CM | POA: Diagnosis not present

## 2016-12-18 DIAGNOSIS — G5620 Lesion of ulnar nerve, unspecified upper limb: Secondary | ICD-10-CM | POA: Insufficient documentation

## 2016-12-18 MED ORDER — LOSARTAN POTASSIUM 50 MG PO TABS: 50.0000 mg | ORAL_TABLET | Freq: Every day | ORAL | 2 refills | Status: DC

## 2016-12-18 MED ORDER — AMLODIPINE BESYLATE 10 MG PO TABS: 10.0000 mg | ORAL_TABLET | Freq: Every day | ORAL | 3 refills | Status: DC

## 2016-12-18 MED ORDER — TRIAMCINOLONE ACETONIDE 0.1 % EX OINT: 1.0000 "application " | TOPICAL_OINTMENT | Freq: Two times a day (BID) | CUTANEOUS | 1 refills | Status: DC

## 2016-12-18 MED ORDER — ALLOPURINOL 100 MG PO TABS: 100.0000 mg | ORAL_TABLET | Freq: Every day | ORAL | 2 refills | Status: DC

## 2016-12-18 MED ORDER — METOPROLOL SUCCINATE ER 50 MG PO TB24: 50.0000 mg | ORAL_TABLET | Freq: Every day | ORAL | 2 refills | Status: DC

## 2016-12-18 MED ORDER — GABAPENTIN 300 MG PO CAPS: 300.0000 mg | ORAL_CAPSULE | Freq: Every day | ORAL | 2 refills | Status: DC

## 2016-12-18 MED FILL — TRIAMCINOLONE 0.1% OINTMENT: 0.1 | 30 days supply | Qty: 80 | Fill #0

## 2016-12-18 NOTE — Patient Instructions (Signed)

## 2016-12-18 NOTE — Progress Notes (Signed)
Subjective:  Patient ID: [name] [name], male    DOB: 07/24/1960  Age: 56 y.o. MRN: [id number]  CC: Rash   HPI Detrick Dani is a 56 year old male with a history of end-stage COPD (on 4 L of oxygen at rest and 8 L with ambulation), gout, ulnar neuropathy, hypertension, dyshidrotic eczema presents today to establish care with me. He was previously followed by Dr. Adrian Blackwater.  At his last visit he was prescribed a course of prednisone taper which she has completed and now remains on Kenalog cream. He reported rapid improvement in his rash with prednisone  but now on the Kenalog cream improvement has been slow. He endorses using products with fragrances.  Last visit to pulmonary was on 11/06/1873 antihistamine was recommended and plan was to continue current management.  His ulnar neuropathy is stable on gabapentin with his last visit to neurology and 09/09/2015 at which time recommendation was to continue B12 and conservative treatment.  He denies any recent gout flares.  Past Medical History:  Diagnosis Date  . Asthma 1999  . 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 COPD (chronic obstructive pulmonary disease) ([place]) 12/15/2013  . Essential hypertension   . Gout   . Influenza with respiratory manifestations 07/30/2012  . Pneumonia   . Thrombocytopenia ([place]) 07/28/2012  . Tobacco abuse 07/29/2012    Past Surgical History:  Procedure Laterality Date  . FRACTURE SURGERY  childhood   right arm  . KNEE SURGERY     right knee s/p trauma    Allergies  Allergen Reactions  . Atorvastatin Itching and Swelling    Facial, tongue swelling  . Shellfish Allergy Anaphylaxis  . Colchicine Hives     Outpatient Medications Prior to Visit  Medication Sig Dispense Refill  . albuterol (PROVENTIL) (2.5 MG/3ML) 0.083% nebulizer solution TAKE 3 MLS BY NEBULIZATION EVERY 6 HOURS AS NEEDED FOR WHEEZING OR SHORTNESS OF BREATH. 90 mL 0  . Cyanocobalamin (VITAMIN B-12) 1000 MCG SUBL Place 1 tablet  (1,000 mcg total) under the tongue daily. 30 tablet 11  . DiphenhydrAMINE HCl (BENADRYL ALLERGY PO) Take by mouth.    Marland Kitchen PROAIR HFA 108 (90 Base) MCG/ACT inhaler INHALE 2 PUFFS INTO THE LUNGS EVERY 4 HOURS AS NEEDED FOR WHEEZING OR SHORTNESS OF BREATH. 8.5 g 1  . SPIRIVA HANDIHALER 18 MCG inhalation capsule PLACE 1 CAPSULE INTO INHALER AND INHALE DAILY 90 capsule 4  . allopurinol (ZYLOPRIM) 100 MG tablet Take 1 tablet (100 mg total) by mouth daily. 30 tablet 2  . amLODipine (NORVASC) 10 MG tablet TAKE 1 TABLET BY MOUTH DAILY. 30 tablet 2  . gabapentin (NEURONTIN) 300 MG capsule TAKE 1 CAPSULE (300 MG TOTAL) BY MOUTH AT BEDTIME. OFFICE VISIT NEEDED 30 capsule 2  . losartan (COZAAR) 50 MG tablet TAKE 1 TABLET BY MOUTH DAILY. 30 tablet 2  . metoprolol succinate (TOPROL-XL) 50 MG 24 hr tablet TAKE 1 TABLET BY MOUTH DAILY 30 tablet 2  . predniSONE (DELTASONE) 10 MG tablet Take by mouth 4 tablets for 2 days, 3 tablets for 2 days, 2 tablets for 2 days, 1 tablet for 3 days then STOP 21 tablet 0  . triamcinolone ointment (KENALOG) 0.1 % Apply 1 application topically 2 (two) times daily. 80 g 1   No facility-administered medications prior to visit.     ROS Review of Systems  Constitutional: Negative for activity change and appetite change.  HENT: Negative for sinus pressure and sore throat.   Eyes: Negative for visual disturbance.  Respiratory: Positive for shortness of breath (which is at baseline). Negative for cough and chest tightness.   Cardiovascular: Negative for chest pain and leg swelling.  Gastrointestinal: Negative for abdominal distention, abdominal pain, constipation and diarrhea.  Endocrine: Negative.   Genitourinary: Negative for dysuria.  Musculoskeletal: Negative for joint swelling and myalgias.  Skin: Positive for rash.  Allergic/Immunologic: Negative.   Neurological: Negative for weakness, light-headedness and numbness.  Psychiatric/Behavioral: Negative for dysphoric mood and  suicidal ideas.    Objective:  BP 120/77   Pulse 83   Temp 97.8 F (36.6 C) (Oral)   Ht 5\' 7"  (1.702 m)   Wt 234 lb 6.4 oz (106.3 kg)   SpO2 94%   BMI 36.71 kg/m   BP/Weight 12/18/2016 11/06/2016 8/67/6195  Systolic BP 093 267 124  Diastolic BP 77 77 80  Wt. (Lbs) 234.4 235.2 233  BMI 36.71 36.84 36.49      Physical Exam  Constitutional: He is oriented to person, place, and time. He appears well-developed and well-nourished.  HENT:  4 L oxygen at rest via nasal cannula  Cardiovascular: Normal rate, normal heart sounds and intact distal pulses.   No murmur heard. Pulmonary/Chest: Effort normal and breath sounds normal. He has no wheezes. He has no rales. He exhibits no tenderness.  Abdominal: Soft. Bowel sounds are normal. He exhibits no distension and no mass. There is no tenderness.  Musculoskeletal: Normal range of motion.  Neurological: He is alert and oriented to person, place, and time.  Skin:  Extensive lichenified rash on plantar and dorsum of both hands and medial aspect of right foot.  Psychiatric: He has a normal mood and affect.     Assessment & Plan:   1. Dyshidrotic eczema Uncontrolled Completed course of prednisone Advised against using scented products - Ambulatory referral to Dermatology - triamcinolone ointment (KENALOG) 0.1 %; Apply 1 application topically 2 (two) times daily.  Dispense: 80 g; Refill: 1  2. Chronic respiratory failure with hypoxia (HCC) Continue 4 L of oxygen at rest on 8 L with ambulation Follow-up with pulmonary  3. Essential hypertension Controlled - Basic Metabolic Panel - metoprolol succinate (TOPROL-XL) 50 MG 24 hr tablet; Take 1 tablet (50 mg total) by mouth daily. Take with or immediately following a meal.  Dispense: 30 tablet; Refill: 2 - losartan (COZAAR) 50 MG tablet; Take 1 tablet (50 mg total) by mouth daily.  Dispense: 30 tablet; Refill: 2 - amLODipine (NORVASC) 10 MG tablet; Take 1 tablet (10 mg total) by mouth  daily.  Dispense: 30 tablet; Refill: 3  4. COPD without exacerbation (HCC) No acute exacerbation Continue Spiriva and rescue MDI  5. Chronic gout without tophus, unspecified cause, unspecified site No acute flares - allopurinol (ZYLOPRIM) 100 MG tablet; Take 1 tablet (100 mg total) by mouth daily.  Dispense: 30 tablet; Refill: 2  6. Ulnar neuropathy of both upper extremities Stable Last seen by neurology one year ago - gabapentin (NEURONTIN) 300 MG capsule; Take 1 capsule (300 mg total) by mouth at bedtime. Office visit needed  Dispense: 30 capsule; Refill: 2   Meds ordered this encounter  Medications  . metoprolol succinate (TOPROL-XL) 50 MG 24 hr tablet    Sig: Take 1 tablet (50 mg total) by mouth daily. Take with or immediately following a meal.    Dispense:  30 tablet    Refill:  2  . losartan (COZAAR) 50 MG tablet    Sig: Take 1 tablet (50 mg total) by mouth daily.  Dispense:  30 tablet    Refill:  2  . amLODipine (NORVASC) 10 MG tablet    Sig: Take 1 tablet (10 mg total) by mouth daily.    Dispense:  30 tablet    Refill:  3  . allopurinol (ZYLOPRIM) 100 MG tablet    Sig: Take 1 tablet (100 mg total) by mouth daily.    Dispense:  30 tablet    Refill:  2  . gabapentin (NEURONTIN) 300 MG capsule    Sig: Take 1 capsule (300 mg total) by mouth at bedtime. Office visit needed    Dispense:  30 capsule    Refill:  2  . triamcinolone ointment (KENALOG) 0.1 %    Sig: Apply 1 application topically 2 (two) times daily.    Dispense:  80 g    Refill:  1    Follow-up: Return in about 3 months (around 03/19/2017) for Follow-up of chronic medical conditions.   Arnoldo Morale MD

## 2016-12-19 ENCOUNTER — Telehealth: Payer: Self-pay

## 2016-12-19 LAB — BASIC METABOLIC PANEL
BUN / CREAT RATIO: 8 — AB (ref 9–20)
BUN: 10 mg/dL (ref 6–24)
CHLORIDE: 99 mmol/L (ref 96–106)
CO2: 29 mmol/L (ref 20–29)
Calcium: 9.4 mg/dL (ref 8.7–10.2)
Creatinine, Ser: 1.24 mg/dL (ref 0.76–1.27)
GFR calc Af Amer: 74 mL/min/{1.73_m2} (ref >59)
GFR calc non Af Amer: 64 mL/min/{1.73_m2} (ref >59)
GLUCOSE: 101 mg/dL — AB (ref 65–99)
POTASSIUM: 4.3 mmol/L (ref 3.5–5.2)
SODIUM: 141 mmol/L (ref 134–144)

## 2016-12-19 NOTE — Telephone Encounter (Signed)
Pt was called and informed of lab results. 

## 2017-01-16 ENCOUNTER — Telehealth: Payer: Self-pay | Admitting: Family Medicine

## 2017-01-16 MED FILL — ALLOPURINOL 100 MG TABLET: 100 | 30 days supply | Qty: 30 | Fill #2

## 2017-01-16 MED FILL — SPIRIVA 18 MCG CP-HANDIHALE: 18 | 30 days supply | Qty: 30 | Fill #9

## 2017-01-16 MED FILL — AMLODIPINE BESYLATE 10 MG T: 10 | 30 days supply | Qty: 30 | Fill #2

## 2017-01-16 MED FILL — METOPROLOL SUCC ER 50 MG TA: 50 | 30 days supply | Qty: 30 | Fill #2

## 2017-01-16 MED FILL — GABAPENTIN 300 MG CAPSULE: 300 | 30 days supply | Qty: 30 | Fill #2

## 2017-01-16 MED FILL — LOSARTAN POTASSIUM 50 MG TA: 50 | 30 days supply | Qty: 30 | Fill #2

## 2017-01-16 NOTE — Telephone Encounter (Signed)
Pt called to speak with the nurse about his referral, please follow up

## 2017-01-16 NOTE — Telephone Encounter (Signed)
Pt was called and informed that referral is still being processed.

## 2017-01-17 MED FILL — TRIAMCINOLONE 0.1% OINTMENT: 0.1 | 30 days supply | Qty: 80 | Fill #1

## 2017-01-18 MED FILL — PROAIR HFA 90 MCG INHALER: 108 (90 BAS | 16 days supply | Qty: 9 | Fill #1

## 2017-02-06 ENCOUNTER — Ambulatory Visit: Payer: Self-pay | Admitting: Pulmonary Disease

## 2017-02-08 ENCOUNTER — Ambulatory Visit: Payer: Self-pay | Admitting: Pulmonary Disease

## 2017-02-16 ENCOUNTER — Other Ambulatory Visit: Payer: Self-pay | Admitting: Family Medicine

## 2017-02-16 DIAGNOSIS — L301 Dyshidrosis [pompholyx]: Secondary | ICD-10-CM

## 2017-02-16 MED FILL — GABAPENTIN 300 MG CAPSULE: 300 | 30 days supply | Qty: 30 | Fill #0

## 2017-02-16 MED FILL — LOSARTAN POTASSIUM 50 MG TA: 50 | 30 days supply | Qty: 30 | Fill #0

## 2017-02-16 MED FILL — AMLODIPINE BESYLATE 10 MG T: 10 | 30 days supply | Qty: 30 | Fill #0

## 2017-02-16 MED FILL — ALBUTEROL 0.083% INHAL SOLN: (2.5 MG/3ML | 7 days supply | Qty: 90 | Fill #0

## 2017-02-16 MED FILL — METOPROLOL SUCC ER 50 MG TA: 50 | 30 days supply | Qty: 30 | Fill #0

## 2017-02-16 MED FILL — SPIRIVA 18 MCG CP-HANDIHALE: 18 | 30 days supply | Qty: 30 | Fill #10

## 2017-02-16 MED FILL — ALLOPURINOL 100 MG TABLET: 100 | 30 days supply | Qty: 30 | Fill #0

## 2017-02-19 ENCOUNTER — Other Ambulatory Visit: Payer: Self-pay

## 2017-02-19 MED ORDER — ALBUTEROL SULFATE HFA 108 (90 BASE) MCG/ACT IN AERS: INHALATION_SPRAY | RESPIRATORY_TRACT | 1 refills | Status: DC

## 2017-02-19 MED FILL — PROAIR HFA 90 MCG INHALER: 108 (90 BAS | 16 days supply | Qty: 9 | Fill #0

## 2017-02-19 MED FILL — TRIAMCINOLONE 0.1% OINTMENT: 0.1 | 30 days supply | Qty: 80 | Fill #0

## 2017-03-14 DIAGNOSIS — L299 Pruritus, unspecified: Secondary | ICD-10-CM | POA: Diagnosis not present

## 2017-03-14 DIAGNOSIS — L403 Pustulosis palmaris et plantaris: Secondary | ICD-10-CM | POA: Diagnosis not present

## 2017-03-14 MED FILL — CLOBETASOL 0.05% CREAM: 0.05 | 30 days supply | Qty: 60 | Fill #0

## 2017-03-15 MED FILL — METOPROLOL SUCC ER 50 MG TA: 50 | 30 days supply | Qty: 30 | Fill #1

## 2017-03-15 MED FILL — ALLOPURINOL 100 MG TABLET: 100 | 30 days supply | Qty: 30 | Fill #1

## 2017-03-15 MED FILL — LOSARTAN POTASSIUM 50 MG TA: 50 | 30 days supply | Qty: 30 | Fill #1

## 2017-03-15 MED FILL — SPIRIVA 18 MCG CP-HANDIHALE: 18 | 30 days supply | Qty: 30 | Fill #11

## 2017-03-15 MED FILL — AMLODIPINE BESYLATE 10 MG T: 10 | 30 days supply | Qty: 30 | Fill #1

## 2017-03-19 ENCOUNTER — Ambulatory Visit: Payer: Self-pay | Admitting: Family Medicine

## 2017-03-21 MED FILL — CALCIPOTRIENE 0.005% OINT: 0.005 | 30 days supply | Qty: 60 | Fill #0

## 2017-04-09 ENCOUNTER — Other Ambulatory Visit: Payer: Self-pay | Admitting: Family Medicine

## 2017-04-09 DIAGNOSIS — L301 Dyshidrosis [pompholyx]: Secondary | ICD-10-CM

## 2017-04-12 MED FILL — TRIAMCINOLONE 0.1% OINTMENT: 0.1 | 30 days supply | Qty: 80 | Fill #0

## 2017-04-13 MED FILL — CALCIPOTRIENE 0.005% OINT: 0.005 | 30 days supply | Qty: 60 | Fill #1

## 2017-04-17 ENCOUNTER — Encounter: Payer: Self-pay | Admitting: Family Medicine

## 2017-04-17 ENCOUNTER — Ambulatory Visit: Payer: Medicaid Other | Attending: Family Medicine | Admitting: Family Medicine

## 2017-04-17 VITALS — BP 133/88 | HR 77 | Temp 97.9°F | Ht 67.0 in | Wt 231.4 lb

## 2017-04-17 DIAGNOSIS — M1A9XX0 Chronic gout, unspecified, without tophus (tophi): Secondary | ICD-10-CM

## 2017-04-17 DIAGNOSIS — F17200 Nicotine dependence, unspecified, uncomplicated: Secondary | ICD-10-CM | POA: Insufficient documentation

## 2017-04-17 DIAGNOSIS — Z79899 Other long term (current) drug therapy: Secondary | ICD-10-CM | POA: Diagnosis not present

## 2017-04-17 DIAGNOSIS — N182 Chronic kidney disease, stage 2 (mild): Secondary | ICD-10-CM | POA: Insufficient documentation

## 2017-04-17 DIAGNOSIS — G5620 Lesion of ulnar nerve, unspecified upper limb: Secondary | ICD-10-CM | POA: Insufficient documentation

## 2017-04-17 DIAGNOSIS — J449 Chronic obstructive pulmonary disease, unspecified: Secondary | ICD-10-CM | POA: Diagnosis not present

## 2017-04-17 DIAGNOSIS — Z91013 Allergy to seafood: Secondary | ICD-10-CM | POA: Diagnosis not present

## 2017-04-17 DIAGNOSIS — J9611 Chronic respiratory failure with hypoxia: Secondary | ICD-10-CM | POA: Diagnosis not present

## 2017-04-17 DIAGNOSIS — Z9111 Patient's noncompliance with dietary regimen: Secondary | ICD-10-CM | POA: Insufficient documentation

## 2017-04-17 DIAGNOSIS — I10 Essential (primary) hypertension: Secondary | ICD-10-CM

## 2017-04-17 DIAGNOSIS — G5623 Lesion of ulnar nerve, bilateral upper limbs: Secondary | ICD-10-CM

## 2017-04-17 DIAGNOSIS — I129 Hypertensive chronic kidney disease with stage 1 through stage 4 chronic kidney disease, or unspecified chronic kidney disease: Secondary | ICD-10-CM | POA: Diagnosis not present

## 2017-04-17 DIAGNOSIS — L301 Dyshidrosis [pompholyx]: Secondary | ICD-10-CM | POA: Diagnosis not present

## 2017-04-17 MED ORDER — METOPROLOL SUCCINATE ER 50 MG PO TB24: 50.0000 mg | ORAL_TABLET | Freq: Every day | ORAL | 3 refills | Status: DC

## 2017-04-17 MED ORDER — ALLOPURINOL 100 MG PO TABS: 100.0000 mg | ORAL_TABLET | Freq: Every day | ORAL | 3 refills | Status: DC

## 2017-04-17 MED ORDER — ALBUTEROL SULFATE (2.5 MG/3ML) 0.083% IN NEBU: INHALATION_SOLUTION | RESPIRATORY_TRACT | 1 refills | Status: DC

## 2017-04-17 MED ORDER — AMLODIPINE BESYLATE 10 MG PO TABS: 10.0000 mg | ORAL_TABLET | Freq: Every day | ORAL | 3 refills | Status: DC

## 2017-04-17 MED ORDER — TIOTROPIUM BROMIDE MONOHYDRATE 18 MCG IN CAPS: ORAL_CAPSULE | RESPIRATORY_TRACT | 3 refills | Status: DC

## 2017-04-17 MED ORDER — GABAPENTIN 300 MG PO CAPS: 300.0000 mg | ORAL_CAPSULE | Freq: Every day | ORAL | 3 refills | Status: DC

## 2017-04-17 MED ORDER — LOSARTAN POTASSIUM 50 MG PO TABS: 50.0000 mg | ORAL_TABLET | Freq: Every day | ORAL | 3 refills | Status: DC

## 2017-04-17 MED ORDER — ALBUTEROL SULFATE HFA 108 (90 BASE) MCG/ACT IN AERS: INHALATION_SPRAY | RESPIRATORY_TRACT | 1 refills | Status: DC

## 2017-04-17 MED ORDER — ALBUTEROL SULFATE (2.5 MG/3ML) 0.083% IN NEBU: INHALATION_SOLUTION | RESPIRATORY_TRACT | 0 refills | Status: DC

## 2017-04-17 MED FILL — METOPROLOL SUCCINATE ER 50: 50 | 30 days supply | Qty: 30 | Fill #0

## 2017-04-17 MED FILL — PROAIR HFA 90 MCG INHALER: 108 (90 BAS | 17 days supply | Qty: 9 | Fill #0

## 2017-04-17 MED FILL — ALLOPURINOL 100 MG TABLET: 100 | 30 days supply | Qty: 30 | Fill #0

## 2017-04-17 MED FILL — ALBUTEROL 0.083% INHAL SOLN: (2.5 MG/3ML | 7 days supply | Qty: 90 | Fill #0

## 2017-04-17 MED FILL — AMLODIPINE BESYLATE 10 MG T: 10 | 30 days supply | Qty: 30 | Fill #0

## 2017-04-17 MED FILL — LOSARTAN POTASSIUM 50 MG TA: 50 | 30 days supply | Qty: 30 | Fill #0

## 2017-04-17 MED FILL — SPIRIVA 18 MCG CP-HANDIHALE: 18 | 30 days supply | Qty: 30 | Fill #0

## 2017-04-17 MED FILL — GABAPENTIN 300 MG CAPSULE: 300 | 30 days supply | Qty: 30 | Fill #0

## 2017-04-17 NOTE — Progress Notes (Signed)
Subjective:  Patient ID: [name] [name], male    DOB: December 27, 1960  Age: 58 y.o. MRN: [id number]  CC: Hypertension and COPD   HPI [name] [name] is a 57 year old male with a history of end-stage COPD (on 4 L of oxygen at rest and 8 L with ambulation), gout, ulnar neuropathy, hypertension, dyshidrotic eczema presents today for a follow-up visit.  He reports recent gout flares over the holidays which he thinks were triggered by his ingestion of excessive sweets but promises to work on his diet and is resisting any regimen change today.  Flare has subsided at this time.  He has been compliant with his antihypertensive and tolerates his medications with no adverse effects. His eczema has been uncontrolled as he ran out of his triamcinolone which is usually prescribed by his dermatologist; he was informed by the pharmacy due to a new refill will be too soon per his insurance guidelines.  He has no other acute concerns today and declines a flu shot.  Past Medical History:  Diagnosis Date  . Asthma 1999  . 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 COPD (chronic obstructive pulmonary disease) ([facility]) 12/15/2013  . Essential hypertension   . Gout   . Influenza with respiratory manifestations 07/30/2012  . Pneumonia   . Thrombocytopenia ([place]) 07/28/2012  . Tobacco abuse 07/29/2012    Past Surgical History:  Procedure Laterality Date  . FRACTURE SURGERY  childhood   right arm  . KNEE SURGERY     right knee s/p trauma    Allergies  Allergen Reactions  . Atorvastatin Itching and Swelling    Facial, tongue swelling  . Shellfish Allergy Anaphylaxis  . Colchicine Hives     Outpatient Medications Prior to Visit  Medication Sig Dispense Refill  . Cyanocobalamin (VITAMIN B-12) 1000 MCG SUBL Place 1 tablet (1,000 mcg total) under the tongue daily. 30 tablet 11  . DiphenhydrAMINE HCl (BENADRYL ALLERGY PO) Take by mouth.    . triamcinolone ointment (KENALOG) 0.1 % APPLY 1 APPLICATION  TOPICALLY 2 TIMES DAILY. 80 g 0  . albuterol (PROAIR HFA) 108 (90 Base) MCG/ACT inhaler INHALE 2 PUFFS INTO THE LUNGS EVERY 4 HOURS AS NEEDED FOR WHEEZING OR SHORTNESS OF BREATH. 8.5 g 1  . albuterol (PROVENTIL) (2.5 MG/3ML) 0.083% nebulizer solution TAKE 3 MLS BY NEBULIZATION EVERY 6 HOURS AS NEEDED FOR WHEEZING OR SHORTNESS OF BREATH. 90 mL 0  . allopurinol (ZYLOPRIM) 100 MG tablet Take 1 tablet (100 mg total) by mouth daily. 30 tablet 2  . amLODipine (NORVASC) 10 MG tablet Take 1 tablet (10 mg total) by mouth daily. 30 tablet 3  . gabapentin (NEURONTIN) 300 MG capsule Take 1 capsule (300 mg total) by mouth at bedtime. Office visit needed 30 capsule 2  . losartan (COZAAR) 50 MG tablet Take 1 tablet (50 mg total) by mouth daily. 30 tablet 2  . metoprolol succinate (TOPROL-XL) 50 MG 24 hr tablet Take 1 tablet (50 mg total) by mouth daily. Take with or immediately following a meal. 30 tablet 2  . SPIRIVA HANDIHALER 18 MCG inhalation capsule PLACE 1 CAPSULE INTO INHALER AND INHALE DAILY 90 capsule 4   No facility-administered medications prior to visit.     ROS Review of Systems  Constitutional: Negative for activity change and appetite change.  HENT: Negative for sinus pressure and sore throat.   Eyes: Negative for visual disturbance.  Respiratory: Negative for cough, chest tightness and shortness of breath.   Cardiovascular: Negative for chest pain  and leg swelling.  Gastrointestinal: Negative for abdominal distention, abdominal pain, constipation and diarrhea.  Endocrine: Negative.   Genitourinary: Negative for dysuria.  Musculoskeletal: Negative for joint swelling and myalgias.  Skin: Positive for rash.  Allergic/Immunologic: Negative.   Neurological: Negative for weakness, light-headedness and numbness.  Psychiatric/Behavioral: Negative for dysphoric mood and suicidal ideas.    Objective:  BP 133/88   Pulse 77   Temp 97.9 F (36.6 C) (Oral)   Ht 5\' 7"  (1.702 m)   Wt 231 lb 6.4  oz (105 kg)   SpO2 96%   BMI 36.24 kg/m   BP/Weight 04/17/2017 12/18/2016 06/21/9700  Systolic BP 637 858 850  Diastolic BP 88 77 77  Wt. (Lbs) 231.4 234.4 235.2  BMI 36.24 36.71 36.84      Physical Exam  Constitutional: He is oriented to person, place, and time. He appears well-developed and well-nourished.  HENT:  On 4 L of oxygen at rest via nasal cannula  Cardiovascular: Normal rate, normal heart sounds and intact distal pulses.  No murmur heard. Pulmonary/Chest: Effort normal and breath sounds normal. He has no wheezes. He has no rales. He exhibits no tenderness.  Abdominal: Soft. Bowel sounds are normal. He exhibits no distension and no mass. There is no tenderness.  Musculoskeletal: Normal range of motion.  Neurological: He is alert and oriented to person, place, and time.  Skin: Skin is warm and dry.  Lichenified rash on palms and dorsal aspect of hands  Psychiatric: He has a normal mood and affect.     Assessment & Plan:   1. Essential hypertension Controlled Low-sodium diet - amLODipine (NORVASC) 10 MG tablet; Take 1 tablet (10 mg total) by mouth daily.  Dispense: 30 tablet; Refill: 3 - losartan (COZAAR) 50 MG tablet; Take 1 tablet (50 mg total) by mouth daily.  Dispense: 30 tablet; Refill: 3 - metoprolol succinate (TOPROL-XL) 50 MG 24 hr tablet; Take 1 tablet (50 mg total) by mouth daily. Take with or immediately following a meal.  Dispense: 30 tablet; Refill: 3  2. Chronic gout without tophus, unspecified cause, unspecified site Recent flares due to noncompliance with diet He would like to work a low purine eating plan and so I will hold off on any regimen changes at this time - allopurinol (ZYLOPRIM) 100 MG tablet; Take 1 tablet (100 mg total) by mouth daily.  Dispense: 30 tablet; Refill: 3  3. Ulnar neuropathy of both upper extremities Stable - gabapentin (NEURONTIN) 300 MG capsule; Take 1 capsule (300 mg total) by mouth at bedtime. Office visit needed   Dispense: 30 capsule; Refill: 3  4. Chronic respiratory failure with hypoxia (HCC) Continue 4 L of oxygen at rest  5. Dyshidrotic eczema Currently on triamcinolone Keep appointment with dermatology  6. COPD without exacerbation (HCC) Stable - albuterol (PROAIR HFA) 108 (90 Base) MCG/ACT inhaler; INHALE 2 PUFFS INTO THE LUNGS EVERY 4 HOURS AS NEEDED FOR WHEEZING OR SHORTNESS OF BREATH.  Dispense: 8.5 g; Refill: 1 - albuterol (PROVENTIL) (2.5 MG/3ML) 0.083% nebulizer solution; Inhale every 6 hours as needed for shortness of breath  Dispense: 75 mL; Refill: 0 - tiotropium (SPIRIVA HANDIHALER) 18 MCG inhalation capsule; PLACE 1 CAPSULE INTO INHALER AND INHALE DAILY  Dispense: 30 capsule; Refill: 3   Meds ordered this encounter  Medications  . albuterol (PROAIR HFA) 108 (90 Base) MCG/ACT inhaler    Sig: INHALE 2 PUFFS INTO THE LUNGS EVERY 4 HOURS AS NEEDED FOR WHEEZING OR SHORTNESS OF BREATH.    Dispense:  8.5 g    Refill:  1  . albuterol (PROVENTIL) (2.5 MG/3ML) 0.083% nebulizer solution    Sig: Inhale every 6 hours as needed for shortness of breath    Dispense:  75 mL    Refill:  0  . amLODipine (NORVASC) 10 MG tablet    Sig: Take 1 tablet (10 mg total) by mouth daily.    Dispense:  30 tablet    Refill:  3  . allopurinol (ZYLOPRIM) 100 MG tablet    Sig: Take 1 tablet (100 mg total) by mouth daily.    Dispense:  30 tablet    Refill:  3  . gabapentin (NEURONTIN) 300 MG capsule    Sig: Take 1 capsule (300 mg total) by mouth at bedtime. Office visit needed    Dispense:  30 capsule    Refill:  3  . losartan (COZAAR) 50 MG tablet    Sig: Take 1 tablet (50 mg total) by mouth daily.    Dispense:  30 tablet    Refill:  3  . metoprolol succinate (TOPROL-XL) 50 MG 24 hr tablet    Sig: Take 1 tablet (50 mg total) by mouth daily. Take with or immediately following a meal.    Dispense:  30 tablet    Refill:  3  . tiotropium (SPIRIVA HANDIHALER) 18 MCG inhalation capsule    Sig: PLACE 1  CAPSULE INTO INHALER AND INHALE DAILY    Dispense:  30 capsule    Refill:  3    Follow-up: No Follow-up on file.   Arnoldo Morale MD

## 2017-04-30 MED FILL — HALOBETASOL PROP 0.05% CRM: 0.05 | 30 days supply | Qty: 60 | Fill #0

## 2017-04-30 MED FILL — DOVONEX 0.005% CREAM: 0.005 | 30 days supply | Qty: 60 | Fill #0

## 2017-05-14 MED FILL — CLOBETASOL 0.05% CREAM: 0.05 | 30 days supply | Qty: 120 | Fill #0

## 2017-05-24 MED FILL — ALLOPURINOL 100 MG TABLET: 100 | 30 days supply | Qty: 30 | Fill #1

## 2017-05-24 MED FILL — SPIRIVA 18 MCG CP-HANDIHALE: 18 | 30 days supply | Qty: 30 | Fill #1

## 2017-05-24 MED FILL — LOSARTAN POTASSIUM 50 MG TA: 50 | 30 days supply | Qty: 30 | Fill #1

## 2017-05-24 MED FILL — PROAIR HFA 90 MCG INHALER: 108 (90 BAS | 16 days supply | Qty: 9 | Fill #1

## 2017-05-24 MED FILL — AMLODIPINE BESYLATE 10 MG T: 10 | 30 days supply | Qty: 30 | Fill #1

## 2017-05-24 MED FILL — METOPROLOL SUCCINATE ER 50: 50 | 30 days supply | Qty: 30 | Fill #1

## 2017-05-24 MED FILL — GABAPENTIN 300 MG CAPSULE: 300 | 30 days supply | Qty: 30 | Fill #1

## 2017-06-28 ENCOUNTER — Telehealth: Payer: Self-pay | Admitting: Family Medicine

## 2017-06-28 NOTE — Telephone Encounter (Signed)
2 page, paperwork received through fax 06-28-17.

## 2017-06-29 MED FILL — GABAPENTIN 300 MG CAPSULE: 300 | 30 days supply | Qty: 30 | Fill #2

## 2017-06-29 MED FILL — METOPROLOL SUCCINATE ER 50: 50 | 30 days supply | Qty: 30 | Fill #2

## 2017-06-29 MED FILL — AMLODIPINE BESYLATE 10 MG T: 10 | 30 days supply | Qty: 30 | Fill #2

## 2017-06-29 MED FILL — PROAIR HFA 90 MCG INHALER: 108 (90 BAS | 17 days supply | Qty: 9 | Fill #1

## 2017-06-29 MED FILL — SPIRIVA 18 MCG CP-HANDIHALE: 18 | 30 days supply | Qty: 30 | Fill #2

## 2017-06-29 MED FILL — LOSARTAN POTASSIUM 50 MG TA: 50 | 30 days supply | Qty: 30 | Fill #2

## 2017-06-29 MED FILL — ALLOPURINOL 100 MG TABLET: 100 | 30 days supply | Qty: 30 | Fill #2

## 2017-07-04 MED FILL — AMMONIUM LACTATE 12% LOTION: 12 | 17 days supply | Qty: 225 | Fill #0

## 2017-07-16 ENCOUNTER — Ambulatory Visit: Payer: Self-pay | Admitting: Pulmonary Disease

## 2017-07-17 ENCOUNTER — Ambulatory Visit: Payer: Medicaid Other | Attending: Family Medicine | Admitting: Family Medicine

## 2017-07-17 ENCOUNTER — Encounter: Payer: Self-pay | Admitting: Family Medicine

## 2017-07-17 VITALS — BP 123/77 | HR 82 | Temp 98.0°F | Ht 67.0 in | Wt 231.8 lb

## 2017-07-17 DIAGNOSIS — N182 Chronic kidney disease, stage 2 (mild): Secondary | ICD-10-CM | POA: Diagnosis not present

## 2017-07-17 DIAGNOSIS — I10 Essential (primary) hypertension: Secondary | ICD-10-CM

## 2017-07-17 DIAGNOSIS — M1A9XX0 Chronic gout, unspecified, without tophus (tophi): Secondary | ICD-10-CM | POA: Diagnosis not present

## 2017-07-17 DIAGNOSIS — L409 Psoriasis, unspecified: Secondary | ICD-10-CM

## 2017-07-17 DIAGNOSIS — Z79899 Other long term (current) drug therapy: Secondary | ICD-10-CM | POA: Insufficient documentation

## 2017-07-17 DIAGNOSIS — I129 Hypertensive chronic kidney disease with stage 1 through stage 4 chronic kidney disease, or unspecified chronic kidney disease: Secondary | ICD-10-CM | POA: Insufficient documentation

## 2017-07-17 DIAGNOSIS — J449 Chronic obstructive pulmonary disease, unspecified: Secondary | ICD-10-CM | POA: Diagnosis present

## 2017-07-17 DIAGNOSIS — G5623 Lesion of ulnar nerve, bilateral upper limbs: Secondary | ICD-10-CM | POA: Diagnosis not present

## 2017-07-17 MED ORDER — METOPROLOL SUCCINATE ER 50 MG PO TB24: 50.0000 mg | ORAL_TABLET | Freq: Every day | ORAL | 3 refills | Status: DC

## 2017-07-17 MED ORDER — ALLOPURINOL 100 MG PO TABS: 100.0000 mg | ORAL_TABLET | Freq: Every day | ORAL | 3 refills | Status: DC

## 2017-07-17 MED ORDER — LOSARTAN POTASSIUM 50 MG PO TABS: 50.0000 mg | ORAL_TABLET | Freq: Every day | ORAL | 3 refills | Status: DC

## 2017-07-17 MED ORDER — GABAPENTIN 300 MG PO CAPS: 300.0000 mg | ORAL_CAPSULE | Freq: Every day | ORAL | 3 refills | Status: DC

## 2017-07-17 MED ORDER — AMLODIPINE BESYLATE 10 MG PO TABS: 10.0000 mg | ORAL_TABLET | Freq: Every day | ORAL | 3 refills | Status: DC

## 2017-07-17 MED ORDER — TIOTROPIUM BROMIDE MONOHYDRATE 18 MCG IN CAPS: ORAL_CAPSULE | RESPIRATORY_TRACT | 3 refills | Status: DC

## 2017-07-17 NOTE — Progress Notes (Signed)
Subjective:  Patient ID: [name] [name], male    DOB: 01/26/1961  Age: 57 y.o. MRN: [id number]  CC: COPD   HPI [name] [name] is a 57 year old male with a history of end-stage COPD (on 4 L of oxygen at rest and 8 L with ambulation), gout, ulnar neuropathy, hypertension, Psoriasis who presents today for a follow-up visit. He currently sees dermatology and he was recently diagnosed with psoriasis and commenced on Otezla however was informed he had abnormalities in his blood work and directed here for repeat blood test.  Rash is no longer controlled on triamcinolone cream. He denies recent gout flares as he has been adhering to a low purine eating plan.  He has been compliant with his antihypertensive and tolerates his medications with no adverse effects. He has had no recent COPD exacerbations and has been compliant with his inhalers.  Past Medical History:  Diagnosis Date  . Asthma 1999  . 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 COPD (chronic obstructive pulmonary disease) ([facility]) 12/15/2013  . Essential hypertension   . Gout   . Influenza with respiratory manifestations 07/30/2012  . Pneumonia   . Thrombocytopenia ([place]) 07/28/2012  . Tobacco abuse 07/29/2012    Past Surgical History:  Procedure Laterality Date  . FRACTURE SURGERY  childhood   right arm  . KNEE SURGERY     right knee s/p trauma    Family History  Problem Relation Age of Onset  . Colon cancer Unknown        mother ? age of diagnosis   . Cancer Mother        colon   . Heart disease Father   . Healthy Son   . Healthy Daughter     Allergies  Allergen Reactions  . Atorvastatin Itching and Swelling    Facial, tongue swelling  . Shellfish Allergy Anaphylaxis  . Colchicine Hives     Outpatient Medications Prior to Visit  Medication Sig Dispense Refill  . albuterol (PROAIR HFA) 108 (90 Base) MCG/ACT inhaler INHALE 2 PUFFS INTO THE LUNGS EVERY 4 HOURS AS NEEDED FOR WHEEZING OR SHORTNESS OF BREATH. 8.5  g 1  . albuterol (PROVENTIL) (2.5 MG/3ML) 0.083% nebulizer solution Inhale every 6 hours as needed for shortness of breath 90 mL 1  . Cyanocobalamin (VITAMIN B-12) 1000 MCG SUBL Place 1 tablet (1,000 mcg total) under the tongue daily. 30 tablet 11  . DiphenhydrAMINE HCl (BENADRYL ALLERGY PO) Take by mouth.    Marland Kitchen allopurinol (ZYLOPRIM) 100 MG tablet Take 1 tablet (100 mg total) by mouth daily. 30 tablet 3  . amLODipine (NORVASC) 10 MG tablet Take 1 tablet (10 mg total) by mouth daily. 30 tablet 3  . gabapentin (NEURONTIN) 300 MG capsule Take 1 capsule (300 mg total) by mouth at bedtime. Office visit needed 30 capsule 3  . losartan (COZAAR) 50 MG tablet Take 1 tablet (50 mg total) by mouth daily. 30 tablet 3  . metoprolol succinate (TOPROL-XL) 50 MG 24 hr tablet Take 1 tablet (50 mg total) by mouth daily. Take with or immediately following a meal. 30 tablet 3  . tiotropium (SPIRIVA HANDIHALER) 18 MCG inhalation capsule PLACE 1 CAPSULE INTO INHALER AND INHALE DAILY 30 capsule 3  . triamcinolone ointment (KENALOG) 0.1 % APPLY 1 APPLICATION TOPICALLY 2 TIMES DAILY. (Patient not taking: Reported on 07/17/2017) 80 g 0   No facility-administered medications prior to visit.     ROS Review of Systems  Constitutional: Negative for activity change and  appetite change.  HENT: Negative for sinus pressure and sore throat.   Eyes: Negative for visual disturbance.  Respiratory: Positive for shortness of breath (chronic). Negative for cough and chest tightness.   Cardiovascular: Negative for chest pain and leg swelling.  Gastrointestinal: Negative for abdominal distention, abdominal pain, constipation and diarrhea.  Endocrine: Negative.   Genitourinary: Negative for dysuria.  Musculoskeletal: Negative for joint swelling and myalgias.  Skin: Positive for rash.  Allergic/Immunologic: Negative.   Neurological: Negative for weakness, light-headedness and numbness.  Psychiatric/Behavioral: Negative for dysphoric  mood and suicidal ideas.    Objective:  BP 123/77   Pulse 82   Temp 98 F (36.7 C) (Oral)   Ht _0  (1.702 m)   Wt 231 lb 12.8 oz (105.1 kg)   SpO2 96%   BMI 36.31 kg/m   BP/Weight 07/17/2017 04/17/2017 1/85/6314  Systolic BP 970 263 785  Diastolic BP 77 88 77  Wt. (Lbs) 231.8 231.4 234.4  BMI 36.31 36.24 36.71      Physical Exam  Constitutional: He is oriented to person, place, and time. He appears well-developed and well-nourished.  HENT:  On 4 L of oxygen via nasal cannula  Cardiovascular: Normal rate, normal heart sounds and intact distal pulses.  No murmur heard. Pulmonary/Chest: Effort normal and breath sounds normal. He has no wheezes. He has no rales. He exhibits no tenderness.  Abdominal: Soft. Bowel sounds are normal. He exhibits no distension and no mass. There is no tenderness.  Musculoskeletal: Normal range of motion.  Neurological: He is alert and oriented to person, place, and time.  Skin:  Lichenified rash on dorsal and palmar aspect of both hands     Assessment & Plan:   1. Essential hypertension Controlled - CMP14+EGFR - CBC with Differential/Platelet - amLODipine (NORVASC) 10 MG tablet; Take 1 tablet (10 mg total) by mouth daily.  Dispense: 30 tablet; Refill: 3 - metoprolol succinate (TOPROL-XL) 50 MG 24 hr tablet; Take 1 tablet (50 mg total) by mouth daily. Take with or immediately following a meal.  Dispense: 30 tablet; Refill: 3 - losartan (COZAAR) 50 MG tablet; Take 1 tablet (50 mg total) by mouth daily.  Dispense: 30 tablet; Refill: 3  2. COPD without exacerbation (HCC) Stable Continue 4 L of oxygen at rest and 8 L on exertion - tiotropium (SPIRIVA HANDIHALER) 18 MCG inhalation capsule; PLACE 1 CAPSULE INTO INHALER AND INHALE DAILY  Dispense: 30 capsule; Refill: 3  3. Ulnar neuropathy of both upper extremities Stable - gabapentin (NEURONTIN) 300 MG capsule; Take 1 capsule (300 mg total) by mouth at bedtime. Office visit needed  Dispense: 30  capsule; Refill: 3  4. Chronic gout without tophus, unspecified cause, unspecified site No recent flares - allopurinol (ZYLOPRIM) 100 MG tablet; Take 1 tablet (100 mg total) by mouth daily.  Dispense: 30 tablet; Refill: 3  5. Psoriasis Uncontrolled We will send of blood work as requested by dermatology   Meds ordered this encounter  Medications  . amLODipine (NORVASC) 10 MG tablet    Sig: Take 1 tablet (10 mg total) by mouth daily.    Dispense:  30 tablet    Refill:  3  . tiotropium (SPIRIVA HANDIHALER) 18 MCG inhalation capsule    Sig: PLACE 1 CAPSULE INTO INHALER AND INHALE DAILY    Dispense:  30 capsule    Refill:  3  . metoprolol succinate (TOPROL-XL) 50 MG 24 hr tablet    Sig: Take 1 tablet (50 mg total) by mouth daily.  Take with or immediately following a meal.    Dispense:  30 tablet    Refill:  3  . losartan (COZAAR) 50 MG tablet    Sig: Take 1 tablet (50 mg total) by mouth daily.    Dispense:  30 tablet    Refill:  3  . gabapentin (NEURONTIN) 300 MG capsule    Sig: Take 1 capsule (300 mg total) by mouth at bedtime. Office visit needed    Dispense:  30 capsule    Refill:  3  . allopurinol (ZYLOPRIM) 100 MG tablet    Sig: Take 1 tablet (100 mg total) by mouth daily.    Dispense:  30 tablet    Refill:  3    Follow-up: Return in about 3 months (around 10/16/2017) for follow up of chronic medical conditions.   Charlott Rakes MD

## 2017-07-18 ENCOUNTER — Ambulatory Visit: Payer: Self-pay | Admitting: Family Medicine

## 2017-07-18 LAB — CBC WITH DIFFERENTIAL/PLATELET
BASOS ABS: 0.1 10*3/uL (ref 0.0–0.2)
Basos: 1 %
EOS (ABSOLUTE): 0.3 10*3/uL (ref 0.0–0.4)
Eos: 4 %
HEMATOCRIT: 40.9 % (ref 37.5–51.0)
Hemoglobin: 12.7 g/dL — ABNORMAL LOW (ref 13.0–17.7)
Immature Grans (Abs): 0.1 10*3/uL (ref 0.0–0.1)
Immature Granulocytes: 1 %
Lymphocytes Absolute: 2.1 10*3/uL (ref 0.7–3.1)
Lymphs: 22 %
MCH: 23.6 pg — ABNORMAL LOW (ref 26.6–33.0)
MCHC: 31.1 g/dL — AB (ref 31.5–35.7)
MCV: 76 fL — ABNORMAL LOW (ref 79–97)
MONOS ABS: 1.4 10*3/uL — AB (ref 0.1–0.9)
Monocytes: 15 %
NEUTROS PCT: 57 %
Neutrophils Absolute: 5.4 10*3/uL (ref 1.4–7.0)
PLATELETS: 226 10*3/uL (ref 150–379)
RBC: 5.39 x10E6/uL (ref 4.14–5.80)
RDW: 14.5 % (ref 12.3–15.4)
WBC: 9.2 10*3/uL (ref 3.4–10.8)

## 2017-07-18 LAB — CMP14+EGFR
ALK PHOS: 99 IU/L (ref 39–117)
ALT: 21 IU/L (ref 0–44)
AST: 19 IU/L (ref 0–40)
Albumin/Globulin Ratio: 1.2 (ref 1.2–2.2)
Albumin: 4.1 g/dL (ref 3.5–5.5)
BILIRUBIN TOTAL: 0.3 mg/dL (ref 0.0–1.2)
BUN/Creatinine Ratio: 9 (ref 9–20)
BUN: 11 mg/dL (ref 6–24)
CHLORIDE: 98 mmol/L (ref 96–106)
CO2: 30 mmol/L — ABNORMAL HIGH (ref 20–29)
Calcium: 9.5 mg/dL (ref 8.7–10.2)
Creatinine, Ser: 1.28 mg/dL — ABNORMAL HIGH (ref 0.76–1.27)
GFR calc Af Amer: 71 mL/min/{1.73_m2} (ref >59)
GFR calc non Af Amer: 61 mL/min/{1.73_m2} (ref >59)
Globulin, Total: 3.3 g/dL (ref 1.5–4.5)
Glucose: 80 mg/dL (ref 65–99)
POTASSIUM: 4.3 mmol/L (ref 3.5–5.2)
SODIUM: 142 mmol/L (ref 134–144)
Total Protein: 7.4 g/dL (ref 6.0–8.5)

## 2017-07-26 ENCOUNTER — Telehealth: Payer: Self-pay

## 2017-07-26 NOTE — Telephone Encounter (Signed)
Patient was called and informed of lab results. 

## 2017-08-01 ENCOUNTER — Ambulatory Visit: Payer: Self-pay | Admitting: Pulmonary Disease

## 2017-08-03 ENCOUNTER — Other Ambulatory Visit: Payer: Self-pay | Admitting: Family Medicine

## 2017-08-03 DIAGNOSIS — J449 Chronic obstructive pulmonary disease, unspecified: Secondary | ICD-10-CM

## 2017-08-03 MED FILL — AMMONIUM LACTATE 12% LOTION: 12 | 17 days supply | Qty: 225 | Fill #1

## 2017-08-03 MED FILL — SPIRIVA 18 MCG CP-HANDIHALE: 18 | 30 days supply | Qty: 30 | Fill #0

## 2017-08-03 MED FILL — GABAPENTIN 300 MG CAPSULE: 300 | 30 days supply | Qty: 30 | Fill #3

## 2017-08-03 MED FILL — AMLODIPINE BESYLATE 10 MG T: 10 | 30 days supply | Qty: 30 | Fill #3

## 2017-08-03 MED FILL — PROAIR HFA 90 MCG INHALER: 108 (90 BAS | 16 days supply | Qty: 9 | Fill #0

## 2017-08-03 MED FILL — LOSARTAN POTASSIUM 50 MG TA: 50 | 30 days supply | Qty: 30 | Fill #3

## 2017-08-03 MED FILL — METOPROLOL TARTRATE 50 MG T: 50 | 30 days supply | Qty: 30 | Fill #0

## 2017-08-03 MED FILL — ALLOPURINOL 100 MG TABLET: 100 | 30 days supply | Qty: 30 | Fill #3

## 2017-08-29 MED FILL — AMMONIUM LACTATE 12% LOTION: 12 | 30 days supply | Qty: 225 | Fill #0

## 2017-08-29 MED FILL — CALCIPOTRIENE-BETAMETH DP O: 0.005-0.064 | 30 days supply | Qty: 60 | Fill #0

## 2017-09-05 MED FILL — LOSARTAN POTASSIUM 50 MG TA: 50 | 30 days supply | Qty: 30 | Fill #0

## 2017-09-05 MED FILL — METOPROLOL SUCCINATE ER 50: 50 | 30 days supply | Qty: 30 | Fill #0

## 2017-09-05 MED FILL — AMLODIPINE BESYLATE 10 MG T: 10 | 30 days supply | Qty: 30 | Fill #0

## 2017-09-05 MED FILL — SPIRIVA 18 MCG CP-HANDIHALE: 18 | 30 days supply | Qty: 30 | Fill #1

## 2017-09-05 MED FILL — GABAPENTIN 300 MG CAPSULE: 300 | 30 days supply | Qty: 30 | Fill #0

## 2017-09-05 MED FILL — PROAIR HFA 90 MCG INHALER: 108 (90 BAS | 16 days supply | Qty: 9 | Fill #1

## 2017-09-05 MED FILL — ALLOPURINOL 100 MG TABLET: 100 | 30 days supply | Qty: 30 | Fill #0

## 2017-10-01 ENCOUNTER — Other Ambulatory Visit: Payer: Self-pay | Admitting: Family Medicine

## 2017-10-01 DIAGNOSIS — J449 Chronic obstructive pulmonary disease, unspecified: Secondary | ICD-10-CM

## 2017-10-01 MED FILL — LOSARTAN POTASSIUM 50 MG TA: 50 | 30 days supply | Qty: 30 | Fill #1

## 2017-10-01 MED FILL — GABAPENTIN 300 MG CAPSULE: 300 | 30 days supply | Qty: 30 | Fill #1

## 2017-10-01 MED FILL — ALLOPURINOL 100 MG TABLET: 100 | 30 days supply | Qty: 30 | Fill #1

## 2017-10-01 MED FILL — METOPROLOL SUCCINATE ER 50: 50 | 30 days supply | Qty: 30 | Fill #1

## 2017-10-01 MED FILL — AMLODIPINE BESYLATE 10 MG T: 10 | 30 days supply | Qty: 30 | Fill #1

## 2017-10-01 MED FILL — SPIRIVA 18 MCG CP-HANDIHALE: 18 | 30 days supply | Qty: 30 | Fill #2

## 2017-10-02 MED FILL — PROAIR HFA 90 MCG INHALER: 108 (90 BAS | 18 days supply | Qty: 9 | Fill #0

## 2017-10-02 NOTE — Telephone Encounter (Signed)
Last seen and received 08/01/17. Refill placed.

## 2017-10-08 DIAGNOSIS — J449 Chronic obstructive pulmonary disease, unspecified: Secondary | ICD-10-CM | POA: Diagnosis not present

## 2017-10-29 ENCOUNTER — Ambulatory Visit: Payer: Self-pay | Admitting: Family Medicine

## 2017-11-02 MED FILL — LOSARTAN POTASSIUM 50 MG TA: 50 | 30 days supply | Qty: 30 | Fill #2

## 2017-11-02 MED FILL — GABAPENTIN 300 MG CAPSULE: 300 | 30 days supply | Qty: 30 | Fill #2

## 2017-11-02 MED FILL — METOPROLOL SUCCINATE ER 50: 50 | 30 days supply | Qty: 30 | Fill #2

## 2017-11-02 MED FILL — ALLOPURINOL 100 MG TABLET: 100 | 30 days supply | Qty: 30 | Fill #2

## 2017-11-02 MED FILL — AMLODIPINE BESYLATE 10 MG T: 10 | 30 days supply | Qty: 30 | Fill #2

## 2017-11-05 MED FILL — SPIRIVA 18 MCG CP-HANDIHALE: 18 | 30 days supply | Qty: 30 | Fill #3

## 2017-11-08 DIAGNOSIS — J449 Chronic obstructive pulmonary disease, unspecified: Secondary | ICD-10-CM | POA: Diagnosis not present

## 2017-11-19 MED FILL — AMMONIUM LACTATE 12% LOTION: 12 | 30 days supply | Qty: 225 | Fill #1

## 2017-11-20 MED FILL — PROAIR HFA 90 MCG INHALER: 108 (90 BAS | 18 days supply | Qty: 9 | Fill #1

## 2017-11-22 MED FILL — CALCIPOTRIENE-BETAMETH DP O: 0.005-0.064 | 30 days supply | Qty: 100 | Fill #0

## 2017-11-28 DIAGNOSIS — L403 Pustulosis palmaris et plantaris: Secondary | ICD-10-CM | POA: Diagnosis not present

## 2017-11-28 DIAGNOSIS — Z7689 Persons encountering health services in other specified circumstances: Secondary | ICD-10-CM | POA: Diagnosis not present

## 2017-11-28 DIAGNOSIS — Z79899 Other long term (current) drug therapy: Secondary | ICD-10-CM | POA: Diagnosis not present

## 2017-12-09 DIAGNOSIS — J449 Chronic obstructive pulmonary disease, unspecified: Secondary | ICD-10-CM | POA: Diagnosis not present

## 2017-12-11 ENCOUNTER — Other Ambulatory Visit: Payer: Self-pay | Admitting: Family Medicine

## 2017-12-11 DIAGNOSIS — J449 Chronic obstructive pulmonary disease, unspecified: Secondary | ICD-10-CM

## 2017-12-11 MED FILL — METOPROLOL SUCCINATE ER 50: 50 | 30 days supply | Qty: 30 | Fill #3

## 2017-12-11 MED FILL — ALLOPURINOL 100 MG TABLET: 100 | 30 days supply | Qty: 30 | Fill #3

## 2017-12-11 MED FILL — PROAIR HFA 90 MCG INHALER: 108 (90 BAS | 16 days supply | Qty: 9 | Fill #0

## 2017-12-11 MED FILL — AMLODIPINE BESYLATE 10 MG T: 10 | 30 days supply | Qty: 30 | Fill #3

## 2017-12-11 MED FILL — GABAPENTIN 300 MG CAPSULE: 300 | 30 days supply | Qty: 30 | Fill #3

## 2017-12-11 MED FILL — SPIRIVA 18 MCG CP-HANDIHALE: 18 | 30 days supply | Qty: 30 | Fill #3

## 2017-12-11 MED FILL — LOSARTAN POTASSIUM 50 MG TA: 50 | 30 days supply | Qty: 30 | Fill #3

## 2017-12-20 ENCOUNTER — Encounter: Payer: Self-pay | Admitting: Pulmonary Disease

## 2017-12-20 ENCOUNTER — Ambulatory Visit (INDEPENDENT_AMBULATORY_CARE_PROVIDER_SITE_OTHER): Payer: Medicaid Other | Admitting: Pulmonary Disease

## 2017-12-20 VITALS — BP 132/84 | HR 84 | Ht 68.5 in | Wt 222.4 lb

## 2017-12-20 DIAGNOSIS — J449 Chronic obstructive pulmonary disease, unspecified: Secondary | ICD-10-CM

## 2017-12-20 DIAGNOSIS — Z7689 Persons encountering health services in other specified circumstances: Secondary | ICD-10-CM | POA: Diagnosis not present

## 2017-12-20 DIAGNOSIS — I272 Pulmonary hypertension, unspecified: Secondary | ICD-10-CM | POA: Diagnosis not present

## 2017-12-20 MED ORDER — FLUTICASONE FUROATE-VILANTEROL 200-25 MCG/INH IN AEPB: 1.0000 | INHALATION_SPRAY | Freq: Every day | RESPIRATORY_TRACT | 6 refills | Status: DC

## 2017-12-20 MED ORDER — FLUTICASONE FUROATE-VILANTEROL 200-25 MCG/INH IN AEPB: 1.0000 | INHALATION_SPRAY | Freq: Every day | RESPIRATORY_TRACT | 0 refills | Status: AC

## 2017-12-20 NOTE — Progress Notes (Signed)
[name] [name]    [id number]    1960-07-28  Primary Care Physician:Newlin, Charlane Ferretti, MD  Referring Physician: Charlott Rakes, MD [address] [place], [place] [address]  Chief complaint:   Follow up for COPD GOLD B (Cat score 17, 0 exacerbations) Pulmonary HTN  HPI: [name] is a 57 year old with extensive smoking history. His main complaint is dyspnea on exertion for the past several years. He has chronic cough with mucus production. Denies any wheezing, hemoptysis. He was maintained initially on just albuterol rescue inhaler. He is maintained on spiriva. He feels very currently. He has dyspnea on exertion which is not different from baseline. Denies any cough, sputum production, hemoptysis, fever, weight loss.  Interim history: Continues on Spiriva.  Feels about the same.  Gets dyspneic on exertion, mild symptoms at rest.  He has chronic cough with white mucus production  Physical Exam: Blood pressure 132/84, pulse 84, height 5' 8.5" (1.74 m), weight 222 lb 6.4 oz (100.9 kg), SpO2 94 %. Gen:      No acute distress HEENT:  EOMI, sclera anicteric Neck:     No masses; no thyromegaly Lungs:    Clear to auscultation bilaterally; normal respiratory effort CV:         Regular rate and rhythm; no murmurs Abd:      + bowel sounds; soft, non-tender; no palpable masses, no distension Ext:    No edema; adequate peripheral perfusion Skin:      Warm and dry; no rash Neuro: alert and oriented x 3 Psych: normal mood and affect  Data Reviewed: Imaging CTA scan 12/13/13 1. No pulmonary embolism. Severe centrilobular emphysema. 2. Mild airway thickening favoring the lower lobes, possibly a manifestation of bronchitis or reactive airways disease. 3. Mild atelectasis in both lower lobes in the lingula. 4. Cardiomegaly, with particular right heart enlargement.  PFTs. 4/13/17VC 2.09 [PERCENT) FEV1 1.04 (36%) F/F 50 DLCO 38% Severe obstructive lung disease with reduction in  diffusion capacity.  Cardiac Echo 10/27/14 Normal LV function; mild LVH and LVE; grade 1 diastolic dysfunction; moderate LAE; mild RAE; trace TR; severely elevated pulmonary pressure. PAP 72  Echo 07/21/2016 LVEF 70-01%, grade 1 diastolic dysfunction, PAP 42  Sleep study 06/15/15 No significant obstructive sleep apnea, moderate O2 desaturation  Labs Alpha-1 antitrypsin 06/16/2016-137, PI MM HIV 07/06/2016-negative SSB 7.9, double-stranded DNA, SSA, rheumatoid factor-negative Hepatic function panel 07/17/2017-negative  Assessment:  Severe COPD Continues on Spiriva.  Will add Breo Continue albuterol rescue inhaler Alpha-1 antitrypsin levels are normal.    Pulmonary hypertension. 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  Continue supplemental oxygen Repeat echo in 2018 shows improvement in PA pressures Work-up for alternative PAH etiologies including HIV, LFTs, ANA, RF, CCP is negative.  He has mild elevation in SSB which is likely nonspecific.  Heavy ex-smoker. He is a candidate for low-dose screening CT. I will discuss with him at his next visit.  Health maintenance Prevnar 12/22/2013 Pneumovax 07/30/2012 He does not want to get the flu vaccine.  Plan/Recommendations: - Continue spiriva, add Breo - Continue supplemental O2  Outpatient Encounter Medications as of 12/20/2017  Medication Sig  . albuterol (PROAIR HFA) 108 (90 Base) MCG/ACT inhaler INHALE 2 PUFFS INTO THE LUNGS EVERY 4 HOURS AS NEEDED FOR WHEEZING OR SHORTNESS OF BREATH.  Marland Kitchen albuterol (PROVENTIL) (2.5 MG/3ML) 0.083% nebulizer solution Inhale every 6 hours as needed for shortness of breath  . allopurinol (  ZYLOPRIM) 100 MG tablet Take 1 tablet (100 mg total) by mouth daily.  Marland Kitchen amLODipine (NORVASC) 10 MG tablet Take 1 tablet (10 mg total) by mouth daily.  . Cyanocobalamin (VITAMIN B-12) 1000 MCG SUBL Place 1 tablet (1,000 mcg total) under the tongue daily.  . DiphenhydrAMINE  HCl (BENADRYL ALLERGY PO) Take by mouth.  . gabapentin (NEURONTIN) 300 MG capsule Take 1 capsule (300 mg total) by mouth at bedtime. Office visit needed  . losartan (COZAAR) 50 MG tablet Take 1 tablet (50 mg total) by mouth daily.  . metoprolol succinate (TOPROL-XL) 50 MG 24 hr tablet Take 1 tablet (50 mg total) by mouth daily. Take with or immediately following a meal.  . tiotropium (SPIRIVA HANDIHALER) 18 MCG inhalation capsule PLACE 1 CAPSULE INTO INHALER AND INHALE DAILY  . triamcinolone ointment (KENALOG) 0.1 % APPLY 1 APPLICATION TOPICALLY 2 TIMES DAILY.  . [DISCONTINUED] ALBUTEROL IN Inhale into the lungs.   No facility-administered encounter medications on file as of 12/20/2017.     Allergies as of 12/20/2017 - Review Complete 12/20/2017  Allergen Reaction Noted  . Atorvastatin Itching and Swelling 10/30/2014  . Shellfish allergy Anaphylaxis 07/28/2012  . Colchicine Hives 10/13/2014    Past Medical History:  Diagnosis Date  . Asthma 1999  . 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 COPD (chronic obstructive pulmonary disease) ([facility]) 12/15/2013  . Essential hypertension   . Gout   . Influenza with respiratory manifestations 07/30/2012  . Pneumonia   . Thrombocytopenia ([facility]) 07/28/2012  . Tobacco abuse 07/29/2012    Past Surgical History:  Procedure Laterality Date  . FRACTURE SURGERY  childhood   right arm  . KNEE SURGERY     right knee s/p trauma    Family History  Problem Relation Age of Onset  . Colon cancer Unknown        mother ? age of diagnosis   . Cancer Mother        colon   . Heart disease Father   . Healthy Son   . Healthy Daughter     Social History   Socioeconomic History  . Marital status: Legally Separated    Spouse name: Not on file  . Number of children: 5   . Years of education: 65  . Highest education level: Not on file  Occupational History  . Occupation: Unemployed   Social Needs  . Financial resource strain: Not on file    . Food insecurity:    Worry: Not on file    Inability: Not on file  . Transportation needs:    Medical: Not on file    Non-medical: Not on file  Tobacco Use  . Smoking status: Former Smoker    Packs/day: 1.00    Years: 20.00    Pack years: 20.00    Last attempt to quit: 12/15/2013    Years since quitting: 4.0  . Smokeless tobacco: Never Used  . Tobacco comment: 'Quitting"  Substance and Sexual Activity  . Alcohol use: Yes    Alcohol/week: 0.0 standard drinks    Comment: occaisional beer - previously one beer daily, stopped 4 months  . Drug use: No    Types: Cocaine    Comment: 10/13/14: last use of cocaine one month ago   . Sexual activity: Yes    Birth control/protection: Condom  Lifestyle  . Physical activity:    Days per week: Not on file    Minutes per session: Not on file  . Stress:  Not on file  Relationships  . Social connections:    Talks on phone: Not on file    Gets together: Not on file    Attends religious service: Not on file    Active member of club or organization: Not on file    Attends meetings of clubs or organizations: Not on file    Relationship status: Not on file  . Intimate partner violence:    Fear of current or ex partner: Not on file    Emotionally abused: Not on file    Physically abused: Not on file    Forced sexual activity: Not on file  Other Topics Concern  . Not on file  Social History Narrative   Lives with daughter and grandkids x 2. 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    Smokes 1ppd since age 51 y.o    Drinks 2 times per week 40 oz beer. Denies liquor, wine. History of cocaine use. Denies IVDU   Has an orange card    Sexually active with 1 partner uses protection             Review of systems: Review of Systems  Constitutional: Negative for fever and chills.  HENT: Negative.   Eyes: Negative for blurred vision.  Respiratory: as per HPI  Cardiovascular:  Negative for chest pain and palpitations.  Gastrointestinal: Negative for vomiting, diarrhea, blood per rectum. Genitourinary: Negative for dysuria, urgency, frequency and hematuria.  Musculoskeletal: Negative for myalgias, back pain and joint pain.  Skin: Negative for itching and rash.  Neurological: Negative for dizziness, tremors, focal weakness, seizures and loss of consciousness.  Endo/Heme/Allergies: Negative for environmental allergies.  Psychiatric/Behavioral: Negative for depression, suicidal ideas and hallucinations.  All other systems reviewed and are negative.  [name] MD [facility] 12/20/2017, 1:58 PM  CC: Charlott Rakes, MD

## 2017-12-20 NOTE — Patient Instructions (Signed)
Continue the Spiriva.  Will add an additional inhaler called Breo 200 Continue albuterol as needed Continue supplemental oxygen Follow-up in 6 months.

## 2017-12-24 ENCOUNTER — Telehealth: Payer: Self-pay | Admitting: Pulmonary Disease

## 2017-12-24 NOTE — Telephone Encounter (Signed)
Received a call from [place]. Pt's insurance will not cover Symbicort but will cover Breo.  Dr. Vaughan Browner - please advise if this is okay, if so what strength of Breo. Thanks.

## 2017-12-25 MED ORDER — BUDESONIDE-FORMOTEROL FUMARATE 160-4.5 MCG/ACT IN AERO: 2.0000 | INHALATION_SPRAY | Freq: Two times a day (BID) | RESPIRATORY_TRACT | 6 refills | Status: DC

## 2017-12-25 MED FILL — ALBUTEROL 0.083% INHAL SOLN: (2.5 MG/3ML | 7 days supply | Qty: 90 | Fill #1

## 2017-12-25 MED FILL — SYMBICORT 160-4.5 MCG INH: 160-4.5 | 30 days supply | Qty: 10 | Fill #0

## 2017-12-25 NOTE — Telephone Encounter (Signed)
[place] and they stated the insurance will cover Symbicort. I sent in Symbicort 160 to the pharmacy. Nothing further is needed.

## 2017-12-25 NOTE — Telephone Encounter (Signed)
Breo 200 if they will cover otherwise please order symbicort 160

## 2018-01-08 DIAGNOSIS — J449 Chronic obstructive pulmonary disease, unspecified: Secondary | ICD-10-CM | POA: Diagnosis not present

## 2018-01-11 ENCOUNTER — Other Ambulatory Visit: Payer: Self-pay | Admitting: Family Medicine

## 2018-01-11 DIAGNOSIS — I10 Essential (primary) hypertension: Secondary | ICD-10-CM

## 2018-01-11 DIAGNOSIS — M1A9XX0 Chronic gout, unspecified, without tophus (tophi): Secondary | ICD-10-CM

## 2018-01-11 DIAGNOSIS — G5623 Lesion of ulnar nerve, bilateral upper limbs: Secondary | ICD-10-CM

## 2018-01-11 MED FILL — METOPROLOL SUCCINATE ER 50: 50 | 30 days supply | Qty: 30 | Fill #3

## 2018-01-11 MED FILL — AMMONIUM LACTATE 12% LOTION: 12 | 30 days supply | Qty: 225 | Fill #2

## 2018-01-14 MED FILL — LOSARTAN POTASSIUM 50 MG TA: 50 | 30 days supply | Qty: 30 | Fill #0

## 2018-01-14 MED FILL — GABAPENTIN 300 MG CAPSULE: 300 | 30 days supply | Qty: 30 | Fill #0

## 2018-01-14 MED FILL — ALLOPURINOL 100 MG TABLET: 100 | 30 days supply | Qty: 30 | Fill #0

## 2018-01-14 MED FILL — AMLODIPINE BESYLATE 10 MG T: 10 | 30 days supply | Qty: 30 | Fill #0

## 2018-01-15 ENCOUNTER — Other Ambulatory Visit: Payer: Self-pay | Admitting: Family Medicine

## 2018-01-15 DIAGNOSIS — J449 Chronic obstructive pulmonary disease, unspecified: Secondary | ICD-10-CM

## 2018-01-15 MED FILL — SPIRIVA 18 MCG CP-HANDIHALE: 18 | 30 days supply | Qty: 30 | Fill #0

## 2018-02-08 DIAGNOSIS — J449 Chronic obstructive pulmonary disease, unspecified: Secondary | ICD-10-CM | POA: Diagnosis not present

## 2018-02-11 ENCOUNTER — Other Ambulatory Visit: Payer: Self-pay | Admitting: Family Medicine

## 2018-02-11 DIAGNOSIS — I10 Essential (primary) hypertension: Secondary | ICD-10-CM

## 2018-02-11 MED FILL — GABAPENTIN 300 MG CAPSULE: 300 | 30 days supply | Qty: 30 | Fill #1

## 2018-02-11 MED FILL — ALLOPURINOL 100 MG TABLET: 100 | 30 days supply | Qty: 30 | Fill #1

## 2018-02-11 MED FILL — SPIRIVA 18 MCG CP-HANDIHALE: 18 | 30 days supply | Qty: 30 | Fill #1

## 2018-02-12 ENCOUNTER — Telehealth: Payer: Self-pay | Admitting: Family Medicine

## 2018-02-12 DIAGNOSIS — I10 Essential (primary) hypertension: Secondary | ICD-10-CM

## 2018-02-12 MED ORDER — METOPROLOL SUCCINATE ER 50 MG PO TB24: 50.0000 mg | ORAL_TABLET | Freq: Every day | ORAL | 0 refills | Status: DC

## 2018-02-12 MED FILL — METOPROLOL SUCCINATE ER 50: 50 | 45 days supply | Qty: 45 | Fill #0

## 2018-02-12 MED FILL — SYMBICORT 160-4.5 MCG INH: 160-4.5 | 30 days supply | Qty: 10 | Fill #1

## 2018-02-12 NOTE — Telephone Encounter (Signed)
Patient called to get their metropolol 50 mg refilled

## 2018-02-14 ENCOUNTER — Other Ambulatory Visit: Payer: Self-pay | Admitting: Family Medicine

## 2018-02-14 DIAGNOSIS — I10 Essential (primary) hypertension: Secondary | ICD-10-CM

## 2018-02-18 MED FILL — AMLODIPINE BESYLATE 10 MG T: 10 | 30 days supply | Qty: 30 | Fill #0

## 2018-02-18 MED FILL — LOSARTAN POTASSIUM 50 MG TA: 50 | 30 days supply | Qty: 30 | Fill #0

## 2018-02-27 DIAGNOSIS — L409 Psoriasis, unspecified: Secondary | ICD-10-CM | POA: Diagnosis not present

## 2018-02-27 DIAGNOSIS — L403 Pustulosis palmaris et plantaris: Secondary | ICD-10-CM | POA: Diagnosis not present

## 2018-02-27 DIAGNOSIS — Z79899 Other long term (current) drug therapy: Secondary | ICD-10-CM | POA: Diagnosis not present

## 2018-02-27 MED FILL — HALOBETASOL PROP 0.05% CRM: 0.05 | 22 days supply | Qty: 100 | Fill #0

## 2018-02-27 MED FILL — AMMONIUM LACTATE 12% LOTION: 12 | 30 days supply | Qty: 225 | Fill #0

## 2018-03-10 DIAGNOSIS — J449 Chronic obstructive pulmonary disease, unspecified: Secondary | ICD-10-CM | POA: Diagnosis not present

## 2018-03-20 ENCOUNTER — Other Ambulatory Visit: Payer: Self-pay | Admitting: Family Medicine

## 2018-03-20 DIAGNOSIS — J449 Chronic obstructive pulmonary disease, unspecified: Secondary | ICD-10-CM

## 2018-03-20 DIAGNOSIS — I10 Essential (primary) hypertension: Secondary | ICD-10-CM

## 2018-03-20 MED FILL — SPIRIVA 18 MCG CP-HANDIHALE: 18 | 30 days supply | Qty: 30 | Fill #2

## 2018-03-20 MED FILL — GABAPENTIN 300 MG CAPSULE: 300 | 30 days supply | Qty: 30 | Fill #2

## 2018-03-20 MED FILL — SYMBICORT 160-4.5 MCG INH: 160-4.5 | 30 days supply | Qty: 10 | Fill #2

## 2018-03-20 MED FILL — ALLOPURINOL 100 MG TABLET: 100 | 30 days supply | Qty: 30 | Fill #2

## 2018-03-21 ENCOUNTER — Other Ambulatory Visit: Payer: Self-pay | Admitting: Family Medicine

## 2018-03-21 DIAGNOSIS — I10 Essential (primary) hypertension: Secondary | ICD-10-CM

## 2018-03-21 MED FILL — METOPROLOL SUCCINATE ER 50: 50 | 30 days supply | Qty: 30 | Fill #0

## 2018-03-21 MED FILL — ALBUTEROL SULFATE HFA 108 (: 108 (90 BAS | 8 days supply | Qty: 9 | Fill #0

## 2018-03-21 MED FILL — AMLODIPINE BESYLATE 10 MG T: 10 | 30 days supply | Qty: 30 | Fill #0

## 2018-03-21 MED FILL — LOSARTAN POTASSIUM 50 MG TA: 50 | 30 days supply | Qty: 30 | Fill #0

## 2018-03-25 ENCOUNTER — Encounter: Payer: Self-pay | Admitting: Family Medicine

## 2018-03-25 ENCOUNTER — Ambulatory Visit: Payer: Medicaid Other | Attending: Family Medicine | Admitting: Family Medicine

## 2018-03-25 VITALS — BP 135/84 | HR 95 | Temp 98.0°F | Ht 68.5 in | Wt 224.0 lb

## 2018-03-25 DIAGNOSIS — Z91013 Allergy to seafood: Secondary | ICD-10-CM | POA: Insufficient documentation

## 2018-03-25 DIAGNOSIS — I129 Hypertensive chronic kidney disease with stage 1 through stage 4 chronic kidney disease, or unspecified chronic kidney disease: Secondary | ICD-10-CM | POA: Insufficient documentation

## 2018-03-25 DIAGNOSIS — N182 Chronic kidney disease, stage 2 (mild): Secondary | ICD-10-CM | POA: Diagnosis not present

## 2018-03-25 DIAGNOSIS — Z87891 Personal history of nicotine dependence: Secondary | ICD-10-CM | POA: Insufficient documentation

## 2018-03-25 DIAGNOSIS — Z79899 Other long term (current) drug therapy: Secondary | ICD-10-CM | POA: Insufficient documentation

## 2018-03-25 DIAGNOSIS — G5623 Lesion of ulnar nerve, bilateral upper limbs: Secondary | ICD-10-CM | POA: Insufficient documentation

## 2018-03-25 DIAGNOSIS — J449 Chronic obstructive pulmonary disease, unspecified: Secondary | ICD-10-CM | POA: Insufficient documentation

## 2018-03-25 DIAGNOSIS — M1A9XX0 Chronic gout, unspecified, without tophus (tophi): Secondary | ICD-10-CM | POA: Diagnosis not present

## 2018-03-25 DIAGNOSIS — I10 Essential (primary) hypertension: Secondary | ICD-10-CM

## 2018-03-25 DIAGNOSIS — Z7689 Persons encountering health services in other specified circumstances: Secondary | ICD-10-CM | POA: Diagnosis not present

## 2018-03-25 MED ORDER — AMLODIPINE BESYLATE 10 MG PO TABS: 10.0000 mg | ORAL_TABLET | Freq: Every day | ORAL | 6 refills | Status: DC

## 2018-03-25 MED ORDER — LOSARTAN POTASSIUM 50 MG PO TABS: 50.0000 mg | ORAL_TABLET | Freq: Every day | ORAL | 6 refills | Status: DC

## 2018-03-25 MED ORDER — ALBUTEROL SULFATE (2.5 MG/3ML) 0.083% IN NEBU: INHALATION_SOLUTION | RESPIRATORY_TRACT | 1 refills | Status: DC

## 2018-03-25 MED ORDER — ALLOPURINOL 100 MG PO TABS: 100.0000 mg | ORAL_TABLET | Freq: Every day | ORAL | 6 refills | Status: DC

## 2018-03-25 MED ORDER — GABAPENTIN 300 MG PO CAPS: 300.0000 mg | ORAL_CAPSULE | Freq: Every day | ORAL | 6 refills | Status: DC

## 2018-03-25 MED ORDER — METOPROLOL SUCCINATE ER 50 MG PO TB24: 50.0000 mg | ORAL_TABLET | Freq: Every day | ORAL | 6 refills | Status: DC

## 2018-03-25 MED ORDER — TIOTROPIUM BROMIDE MONOHYDRATE 18 MCG IN CAPS: ORAL_CAPSULE | RESPIRATORY_TRACT | 6 refills | Status: DC

## 2018-03-25 MED ORDER — ALBUTEROL SULFATE HFA 108 (90 BASE) MCG/ACT IN AERS: INHALATION_SPRAY | RESPIRATORY_TRACT | 6 refills | Status: DC

## 2018-03-25 NOTE — Progress Notes (Signed)
Subjective:  Patient ID: [name] [name], male    DOB: 11/04/1960  Age: 57 y.o. 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 He is just coming off a gout flare as he states he has been noncompliant with a low purine eating plan. He was last seen in the clinic 8 months ago and is running low on his medications.  Continues to be dyspneic but this is at baseline.  At his last visit with his pulmonologist 3 months ago Memory Dance was added to his regimen however insurance did not cover it and so he is on Symbicort He declines a flu shot today as he complains it makes him sick. He currently does not smoke a prior to now works for 30 years.  Past Medical History:  Diagnosis Date  . Asthma 1999  . 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 COPD (chronic obstructive pulmonary disease) ([place]) 12/15/2013  . Essential hypertension   . Gout   . Influenza with respiratory manifestations 07/30/2012  . Pneumonia   . Thrombocytopenia ([place]) 07/28/2012  . Tobacco abuse 07/29/2012    Past Surgical History:  Procedure Laterality Date  . FRACTURE SURGERY  childhood   right arm  . KNEE SURGERY     right knee s/p trauma    Allergies  Allergen Reactions  . Atorvastatin Itching and Swelling    Facial, tongue swelling  . Shellfish Allergy Anaphylaxis  . Colchicine Hives     Outpatient Medications Prior to Visit  Medication Sig Dispense Refill  . budesonide-formoterol (SYMBICORT) 160-4.5 MCG/ACT inhaler Inhale 2 puffs into the lungs 2 (two) times daily. 1 Inhaler 6  . Cyanocobalamin (VITAMIN B-12) 1000 MCG SUBL Place 1 tablet (1,000 mcg total) under the tongue daily. 30 tablet 11  . DiphenhydrAMINE HCl (BENADRYL ALLERGY PO) Take by mouth.    . fluticasone furoate-vilanterol (BREO ELLIPTA) 200-25  MCG/INH AEPB Inhale 1 puff into the lungs daily. 60 each 6  . triamcinolone ointment (KENALOG) 0.1 % APPLY 1 APPLICATION TOPICALLY 2 TIMES DAILY. 80 g 0  . albuterol (PROAIR HFA) 108 (90 Base) MCG/ACT inhaler INHALE 2 PUFFS INTO THE LUNGS EVERY 4 HOURS AS NEEDED FOR WHEEZING OR SHORTNESS OF BREATH. 8.5 g 0  . albuterol (PROVENTIL) (2.5 MG/3ML) 0.083% nebulizer solution Inhale every 6 hours as needed for shortness of breath 90 mL 1  . allopurinol (ZYLOPRIM) 100 MG tablet TAKE 1 TABLET (100 MG TOTAL) BY MOUTH DAILY. 30 tablet 3  . amLODipine (NORVASC) 10 MG tablet TAKE 1 TABLET (10 MG TOTAL) BY MOUTH DAILY. MUST MAKE APPT FOR FURTHER REFILLS 30 tablet 0  . gabapentin (NEURONTIN) 300 MG capsule TAKE 1 CAPSULE (300 MG TOTAL) BY MOUTH AT BEDTIME. OFFICE VISIT NEEDED 30 capsule 3  . losartan (COZAAR) 50 MG tablet TAKE 1 TABLET (50 MG TOTAL) BY MOUTH DAILY. MUST MAKE APPT FOR FURTHER REFILLS 30 tablet 0  . metoprolol succinate (TOPROL-XL) 50 MG 24 hr tablet TAKE 1 TABLET (50 MG TOTAL) BY MOUTH DAILY. TAKE WITH OR IMMEDIATELY FOLLOWING A MEAL. 45 tablet 0  . tiotropium (SPIRIVA HANDIHALER) 18 MCG inhalation capsule PLACE 1 CAPSULE INTO INHALER AND INHALE DAILY 30 capsule 2   No facility-administered medications prior to visit.     ROS Review of Systems  Constitutional: Negative for  activity change and appetite change.  HENT: Negative for sinus pressure and sore throat.   Eyes: Negative for visual disturbance.  Respiratory: Positive for shortness of breath (at baseline). Negative for cough and chest tightness.   Cardiovascular: Negative for chest pain and leg swelling.  Gastrointestinal: Negative for abdominal distention, abdominal pain, constipation and diarrhea.  Endocrine: Negative.   Genitourinary: Negative for dysuria.  Musculoskeletal: Negative for joint swelling and myalgias.  Skin: Negative for rash.  Allergic/Immunologic: Negative.   Neurological: Negative for weakness, light-headedness  and numbness.  Psychiatric/Behavioral: Negative for dysphoric mood and suicidal ideas.    Objective:  BP 135/84   Pulse 95   Temp 98 F (36.7 C) (Oral)   Ht 5' 8.5" (1.74 m)   Wt 224 lb (101.6 kg)   SpO2 94%   BMI 33.56 kg/m   BP/Weight 03/25/2018 07/17/1446 04/17/5629  Systolic BP 497 026 378  Diastolic BP 84 84 77  Wt. (Lbs) 224 222.4 231.8  BMI 33.56 33.32 36.31      Physical Exam Constitutional:      Appearance: He is well-developed.  HENT:     Head:     Comments: 4 L of oxygen via nasal cannula Cardiovascular:     Rate and Rhythm: Normal rate.     Heart sounds: Normal heart sounds. No murmur.  Pulmonary:     Effort: Pulmonary effort is normal.     Breath sounds: No wheezing or rales.     Comments: Short expiratory phase of inspiration Chest:     Chest wall: No tenderness.  Abdominal:     General: Bowel sounds are normal. There is no distension.     Palpations: Abdomen is soft. There is no mass.     Tenderness: There is no abdominal tenderness.  Musculoskeletal: Normal range of motion.  Neurological:     Mental Status: He is alert and oriented to person, place, and time.  Psychiatric:        Mood and Affect: Mood normal.        Behavior: Behavior normal.      Assessment & Plan:   1. COPD without exacerbation (HCC) Stable, no exacerbation - albuterol (PROVENTIL) (2.5 MG/3ML) 0.083% nebulizer solution; Inhale every 6 hours as needed for shortness of breath  Dispense: 90 mL; Refill: 1 - albuterol (PROAIR HFA) 108 (90 Base) MCG/ACT inhaler; INHALE 2 PUFFS INTO THE LUNGS EVERY 4 HOURS AS NEEDED FOR WHEEZING OR SHORTNESS OF BREATH.  Dispense: 8.5 g; Refill: 6 - tiotropium (SPIRIVA HANDIHALER) 18 MCG inhalation capsule; PLACE 1 CAPSULE INTO INHALER AND INHALE DAILY  Dispense: 30 capsule; Refill: 6  2. Chronic gout without tophus, unspecified cause, unspecified site Recently getting over a flare - allopurinol (ZYLOPRIM) 100 MG tablet; Take 1 tablet (100 mg  total) by mouth daily.  Dispense: 30 tablet; Refill: 6  3. Essential hypertension Controlled Counseled on blood pressure goal of less than 130/80, low-sodium, DASH diet, medication compliance, 150 minutes of moderate intensity exercise per week. Discussed medication compliance, adverse effects. - amLODipine (NORVASC) 10 MG tablet; Take 1 tablet (10 mg total) by mouth daily.  Dispense: 30 tablet; Refill: 6 - losartan (COZAAR) 50 MG tablet; Take 1 tablet (50 mg total) by mouth daily.  Dispense: 30 tablet; Refill: 6 - metoprolol succinate (TOPROL-XL) 50 MG 24 hr tablet; Take 1 tablet (50 mg total) by mouth daily. Take with or immediately following a meal.  Dispense: 30 tablet; Refill: 6 - Lipid panel; Future - CMP14+EGFR; Future  4. Ulnar neuropathy of both upper extremities Stable - gabapentin (NEURONTIN) 300 MG capsule; Take 1 capsule (300 mg total) by mouth at bedtime.  Dispense: 30 capsule; Refill: 6  5. History of tobacco abuse - CT CHEST LUNG CA SCREEN LOW DOSE W/O CM; Future   Meds ordered this encounter  Medications  . albuterol (PROVENTIL) (2.5 MG/3ML) 0.083% nebulizer solution    Sig: Inhale every 6 hours as needed for shortness of breath    Dispense:  90 mL    Refill:  1  . albuterol (PROAIR HFA) 108 (90 Base) MCG/ACT inhaler    Sig: INHALE 2 PUFFS INTO THE LUNGS EVERY 4 HOURS AS NEEDED FOR WHEEZING OR SHORTNESS OF BREATH.    Dispense:  8.5 g    Refill:  6  . allopurinol (ZYLOPRIM) 100 MG tablet    Sig: Take 1 tablet (100 mg total) by mouth daily.    Dispense:  30 tablet    Refill:  6  . amLODipine (NORVASC) 10 MG tablet    Sig: Take 1 tablet (10 mg total) by mouth daily.    Dispense:  30 tablet    Refill:  6  . gabapentin (NEURONTIN) 300 MG capsule    Sig: Take 1 capsule (300 mg total) by mouth at bedtime.    Dispense:  30 capsule    Refill:  6  . losartan (COZAAR) 50 MG tablet    Sig: Take 1 tablet (50 mg total) by mouth daily.    Dispense:  30 tablet    Refill:   6  . metoprolol succinate (TOPROL-XL) 50 MG 24 hr tablet    Sig: Take 1 tablet (50 mg total) by mouth daily. Take with or immediately following a meal.    Dispense:  30 tablet    Refill:  6  . tiotropium (SPIRIVA HANDIHALER) 18 MCG inhalation capsule    Sig: PLACE 1 CAPSULE INTO INHALER AND INHALE DAILY    Dispense:  30 capsule    Refill:  6    Follow-up: Return in about 3 months (around 06/24/2018) for Follow-up of chronic medical conditions.   Charlott Rakes MD

## 2018-04-01 ENCOUNTER — Ambulatory Visit (HOSPITAL_COMMUNITY): Payer: Medicaid Other | Attending: Family Medicine

## 2018-04-10 DIAGNOSIS — J449 Chronic obstructive pulmonary disease, unspecified: Secondary | ICD-10-CM | POA: Diagnosis not present

## 2018-04-16 MED FILL — AMLODIPINE BESYLATE 10 MG T: 10 | 30 days supply | Qty: 30 | Fill #0

## 2018-04-16 MED FILL — LOSARTAN POTASSIUM 50 MG TA: 50 | 30 days supply | Qty: 30 | Fill #0

## 2018-04-16 MED FILL — SYMBICORT 160-4.5 MCG INH: 160-4.5 | 30 days supply | Qty: 10 | Fill #3

## 2018-04-16 MED FILL — GABAPENTIN 300 MG CAPSULE: 300 | 30 days supply | Qty: 30 | Fill #0

## 2018-04-16 MED FILL — PROAIR HFA 90 MCG INHALER: 108 (90 BAS | 25 days supply | Qty: 9 | Fill #0

## 2018-04-16 MED FILL — ALLOPURINOL 100 MG TABLET: 100 | 30 days supply | Qty: 30 | Fill #0

## 2018-04-16 MED FILL — METOPROLOL SUCCINATE ER 50: 50 | 30 days supply | Qty: 30 | Fill #0

## 2018-04-16 MED FILL — SPIRIVA 18 MCG CP-HANDIHALE: 18 | 30 days supply | Qty: 30 | Fill #0

## 2018-04-16 MED FILL — AMMONIUM LACTATE 12% LOTION: 12 | 34 days supply | Qty: 400 | Fill #1

## 2018-04-23 ENCOUNTER — Telehealth: Payer: Self-pay | Admitting: Family Medicine

## 2018-04-23 NOTE — Telephone Encounter (Signed)
Patient missed the CT Chest lung screening, due to a family emergency. please follow up with a new appointment

## 2018-04-26 NOTE — Telephone Encounter (Signed)
Patient was called and informed of new CT scan appointment.

## 2018-04-29 ENCOUNTER — Telehealth: Payer: Self-pay | Admitting: Family Medicine

## 2018-04-29 NOTE — Telephone Encounter (Signed)
[name] [name] called in regards to the prior auth for eva core CT has expired. Patient has an appt scheduled for CT on 04/30/2018.   [phone]  Please follow up.

## 2018-04-30 ENCOUNTER — Ambulatory Visit (HOSPITAL_COMMUNITY): Payer: Medicaid Other

## 2018-04-30 NOTE — Telephone Encounter (Signed)
Prior [name] [name] has been done and office notes has been sent over for review.

## 2018-05-11 DIAGNOSIS — J449 Chronic obstructive pulmonary disease, unspecified: Secondary | ICD-10-CM | POA: Diagnosis not present

## 2018-05-15 ENCOUNTER — Telehealth: Payer: Self-pay | Admitting: Family Medicine

## 2018-05-15 NOTE — Telephone Encounter (Signed)
[name] [name] with [facility] called  for pre-cert for the patients CT. Please follow up.

## 2018-05-17 NOTE — Telephone Encounter (Signed)
[employer] has been contacted and case has been restarted and chart notes were faxed over on 05/17/18

## 2018-05-21 MED FILL — METOPROLOL SUCCINATE ER 50: 50 | 30 days supply | Qty: 30 | Fill #1

## 2018-05-21 MED FILL — SYMBICORT 160-4.5 MCG INH: 160-4.5 | 30 days supply | Qty: 10 | Fill #4

## 2018-05-21 MED FILL — LOSARTAN POTASSIUM 50 MG TA: 50 | 30 days supply | Qty: 30 | Fill #1

## 2018-05-21 MED FILL — PROAIR HFA 90 MCG INHALER: 108 (90 BAS | 16 days supply | Qty: 9 | Fill #1

## 2018-05-21 MED FILL — SPIRIVA 18 MCG CP-HANDIHALE: 18 | 30 days supply | Qty: 30 | Fill #1

## 2018-05-21 MED FILL — GABAPENTIN 300 MG CAPSULE: 300 | 30 days supply | Qty: 30 | Fill #1

## 2018-05-21 MED FILL — AMLODIPINE BESYLATE 10 MG T: 10 | 30 days supply | Qty: 30 | Fill #1

## 2018-05-21 MED FILL — ALLOPURINOL 100 MG TABLET: 100 | 30 days supply | Qty: 30 | Fill #1

## 2018-05-23 ENCOUNTER — Ambulatory Visit (HOSPITAL_COMMUNITY): Payer: Medicaid Other | Attending: Family Medicine

## 2018-06-05 DIAGNOSIS — L403 Pustulosis palmaris et plantaris: Secondary | ICD-10-CM | POA: Diagnosis not present

## 2018-06-05 DIAGNOSIS — Z79899 Other long term (current) drug therapy: Secondary | ICD-10-CM | POA: Diagnosis not present

## 2018-06-05 DIAGNOSIS — Z7689 Persons encountering health services in other specified circumstances: Secondary | ICD-10-CM | POA: Diagnosis not present

## 2018-06-09 DIAGNOSIS — J449 Chronic obstructive pulmonary disease, unspecified: Secondary | ICD-10-CM | POA: Diagnosis not present

## 2018-06-18 ENCOUNTER — Telehealth: Payer: Self-pay | Admitting: Family Medicine

## 2018-06-18 NOTE — Telephone Encounter (Signed)
Patient states he would like to speak to someone in regards to getting his oxygen supplier changed. States his Yolanda Bonine is no longer with him to pick it up for him. Please follow up.

## 2018-06-24 ENCOUNTER — Other Ambulatory Visit: Payer: Self-pay | Admitting: Family Medicine

## 2018-06-24 DIAGNOSIS — J449 Chronic obstructive pulmonary disease, unspecified: Secondary | ICD-10-CM

## 2018-06-24 MED FILL — METOPROLOL SUCCINATE ER 50: 50 | 90 days supply | Qty: 90 | Fill #2

## 2018-06-24 MED FILL — AMLODIPINE BESYLATE 10 MG T: 10 | 90 days supply | Qty: 90 | Fill #2

## 2018-06-24 MED FILL — GABAPENTIN 300 MG CAPSULE: 300 | 90 days supply | Qty: 90 | Fill #2

## 2018-06-24 MED FILL — ALLOPURINOL 100 MG TABLET: 100 | 90 days supply | Qty: 90 | Fill #2

## 2018-06-24 MED FILL — LOSARTAN POTASSIUM 50 MG TA: 50 | 90 days supply | Qty: 90 | Fill #2

## 2018-06-25 MED FILL — SPIRIVA 18 MCG CP-HANDIHALE: 18 | 30 days supply | Qty: 30 | Fill #0

## 2018-06-25 MED FILL — PROAIR HFA 90 MCG INHALER: 108 (90 BAS | 25 days supply | Qty: 9 | Fill #0

## 2018-06-25 MED FILL — ALBUTEROL SUL 2.5 MG/3 ML S: (2.5 MG/3ML | 5 days supply | Qty: 75 | Fill #0

## 2018-06-27 MED FILL — SYMBICORT 160-4.5 MCG INH: 160-4.5 | 30 days supply | Qty: 10 | Fill #5

## 2018-07-10 DIAGNOSIS — J449 Chronic obstructive pulmonary disease, unspecified: Secondary | ICD-10-CM | POA: Diagnosis not present

## 2018-07-29 MED FILL — SPIRIVA 18 MCG CP-HANDIHALE: 18 | 30 days supply | Qty: 30 | Fill #1

## 2018-07-29 MED FILL — PROAIR HFA 90 MCG INHALER: 108 (90 BAS | 25 days supply | Qty: 9 | Fill #1

## 2018-07-29 MED FILL — SYMBICORT 160-4.5 MCG INH: 160-4.5 | 30 days supply | Qty: 10 | Fill #6

## 2018-08-09 DIAGNOSIS — J449 Chronic obstructive pulmonary disease, unspecified: Secondary | ICD-10-CM | POA: Diagnosis not present

## 2018-08-13 ENCOUNTER — Telehealth: Payer: Self-pay | Admitting: Family Medicine

## 2018-08-13 NOTE — Telephone Encounter (Signed)
Patient called stating he is afraid to go to the hospital and that he called the ED and stated that he was told to FU with PCP within 24 hrs. Patient has not had a bowel movement since Friday. Patient states he had coffee and went a little bit this morning. Please follow up.   Advised to go to [facility] or ED

## 2018-08-13 NOTE — Telephone Encounter (Signed)
Pt called in wanted to speak with pcp did not state reason please follow up

## 2018-08-13 NOTE — Telephone Encounter (Signed)
Patient was called and informed of appointment on 08/15/2018.

## 2018-08-15 ENCOUNTER — Other Ambulatory Visit: Payer: Self-pay

## 2018-08-15 ENCOUNTER — Ambulatory Visit: Payer: Medicaid Other | Attending: Family Medicine | Admitting: Physician Assistant

## 2018-08-15 DIAGNOSIS — K5900 Constipation, unspecified: Secondary | ICD-10-CM | POA: Diagnosis not present

## 2018-08-15 DIAGNOSIS — R140 Abdominal distension (gaseous): Secondary | ICD-10-CM

## 2018-08-15 NOTE — Progress Notes (Signed)
Called patient to initiate their telephone visit with provider Freeman Caldron, PA. Verified date of birth. Patient has c/o constipation, bloating & gassiness x 1 week. Took a laxative with minimal relief. KWalker, CMA.

## 2018-08-15 NOTE — Progress Notes (Signed)
Virtual Visit via Telephone Note  I connected with [name] [name] on 08/15/18 at 10:50 AM EDT by telephone and verified that I am speaking with the correct person using two identifiers.   I discussed the limitations, risks, security and privacy concerns of performing an evaluation and management service by telephone and the availability of in person appointments. I also discussed with the patient that there may be a patient responsible charge related to this service. The patient expressed understanding and agreed to proceed.  Patient location:  home My Location:  [place] office Persons on the call:  Myself and the patient  History of Present Illness:  C/o bloating and abdominal discomfort since Friday.  No fever.  Ate a lot of pizza last week.  Has only had 2 small BMs(one yesterday and 1 today). Appetite is normal.  No fever.  No diarrhea. has never had colonoscopy.  Pain is mild.  Pain is aching.  Has taken a couple of doses of miralax.  No melena, no hematochezia.      Observations/Objective:  TP linear.  Speech is clear   Assessment and Plan: 1. Constipation, unspecified constipation type No labs in ~1 year.  Does not sound like acute abdomen/no red flags.  Advised to go to ED if anything worsens-patient agrees.  miralx daily and increase water intake - Ambulatory referral to Gastroenterology - Comprehensive metabolic panel; Future - TSH; Future - CBC with Differential/Platelet; Future  2. Bloating - Comprehensive metabolic panel; Future - TSH; Future - CBC with Differential/Platelet; Future    Follow Up Instructions: 1 month with PCP   I discussed the assessment and treatment plan with the patient. The patient was provided an opportunity to ask questions and all were answered. The patient agreed with the plan and demonstrated an understanding of the instructions.   The patient was advised to call back or seek an in-person evaluation if the symptoms worsen or if the condition fails  to improve as anticipated.  I provided 12 minutes of non-face-to-face time during this encounter.   Freeman Caldron, PA-C

## 2018-08-22 DIAGNOSIS — J431 Panlobular emphysema: Secondary | ICD-10-CM | POA: Diagnosis not present

## 2018-08-22 DIAGNOSIS — R198 Other specified symptoms and signs involving the digestive system and abdomen: Secondary | ICD-10-CM | POA: Diagnosis not present

## 2018-08-23 ENCOUNTER — Ambulatory Visit: Payer: Medicaid Other | Attending: Family Medicine

## 2018-08-23 ENCOUNTER — Other Ambulatory Visit: Payer: Self-pay

## 2018-08-23 DIAGNOSIS — R140 Abdominal distension (gaseous): Secondary | ICD-10-CM

## 2018-08-23 DIAGNOSIS — K5900 Constipation, unspecified: Secondary | ICD-10-CM

## 2018-08-23 DIAGNOSIS — I10 Essential (primary) hypertension: Secondary | ICD-10-CM

## 2018-08-24 LAB — LIPID PANEL
Chol/HDL Ratio: 4.9 ratio (ref 0.0–5.0)
Cholesterol, Total: 210 mg/dL — ABNORMAL HIGH (ref 100–199)
HDL: 43 mg/dL (ref >39)
LDL Calculated: 147 mg/dL — ABNORMAL HIGH (ref 0–99)
Triglycerides: 102 mg/dL (ref 0–149)
VLDL Cholesterol Cal: 20 mg/dL (ref 5–40)

## 2018-08-24 LAB — COMPREHENSIVE METABOLIC PANEL
ALT: 18 IU/L (ref 0–44)
AST: 15 IU/L (ref 0–40)
Albumin/Globulin Ratio: 1.1 — ABNORMAL LOW (ref 1.2–2.2)
Albumin: 4 g/dL (ref 3.8–4.9)
Alkaline Phosphatase: 102 IU/L (ref 39–117)
BUN/Creatinine Ratio: 7 — ABNORMAL LOW (ref 9–20)
BUN: 10 mg/dL (ref 6–24)
Bilirubin Total: 0.4 mg/dL (ref 0.0–1.2)
CO2: 26 mmol/L (ref 20–29)
Calcium: 9.6 mg/dL (ref 8.7–10.2)
Chloride: 99 mmol/L (ref 96–106)
Creatinine, Ser: 1.48 mg/dL — ABNORMAL HIGH (ref 0.76–1.27)
GFR calc Af Amer: 59 mL/min/{1.73_m2} — ABNORMAL LOW (ref >59)
GFR calc non Af Amer: 51 mL/min/{1.73_m2} — ABNORMAL LOW (ref >59)
Globulin, Total: 3.5 g/dL (ref 1.5–4.5)
Glucose: 88 mg/dL (ref 65–99)
Potassium: 4.6 mmol/L (ref 3.5–5.2)
Sodium: 139 mmol/L (ref 134–144)
Total Protein: 7.5 g/dL (ref 6.0–8.5)

## 2018-08-24 LAB — CBC WITH DIFFERENTIAL/PLATELET
Basophils Absolute: 0.1 10*3/uL (ref 0.0–0.2)
Basos: 1 %
EOS (ABSOLUTE): 0.2 10*3/uL (ref 0.0–0.4)
Eos: 2 %
Hematocrit: 42.8 % (ref 37.5–51.0)
Hemoglobin: 13.4 g/dL (ref 13.0–17.7)
Immature Grans (Abs): 0.1 10*3/uL (ref 0.0–0.1)
Immature Granulocytes: 1 %
Lymphocytes Absolute: 2.7 10*3/uL (ref 0.7–3.1)
Lymphs: 26 %
MCH: 24 pg — ABNORMAL LOW (ref 26.6–33.0)
MCHC: 31.3 g/dL — ABNORMAL LOW (ref 31.5–35.7)
MCV: 77 fL — ABNORMAL LOW (ref 79–97)
Monocytes Absolute: 1.2 10*3/uL — ABNORMAL HIGH (ref 0.1–0.9)
Monocytes: 11 %
Neutrophils Absolute: 6.5 10*3/uL (ref 1.4–7.0)
Neutrophils: 59 %
Platelets: 284 10*3/uL (ref 150–450)
RBC: 5.59 x10E6/uL (ref 4.14–5.80)
RDW: 12.7 % (ref 11.6–15.4)
WBC: 10.8 10*3/uL (ref 3.4–10.8)

## 2018-08-24 LAB — TSH: TSH: 1.56 u[IU]/mL (ref 0.450–4.500)

## 2018-08-28 ENCOUNTER — Other Ambulatory Visit: Payer: Self-pay | Admitting: Family Medicine

## 2018-08-28 ENCOUNTER — Other Ambulatory Visit: Payer: Self-pay | Admitting: Pulmonary Disease

## 2018-08-28 MED ORDER — EZETIMIBE 10 MG PO TABS: 10.0000 mg | ORAL_TABLET | Freq: Every day | ORAL | 1 refills | Status: DC

## 2018-08-28 MED FILL — EZETIMIBE 10 MG TAB: 10 | 90 days supply | Qty: 90 | Fill #0

## 2018-08-28 MED FILL — PROAIR HFA 90 MCG INHALER: 108 (90 BAS | 25 days supply | Qty: 9 | Fill #2

## 2018-08-28 MED FILL — AMMONIUM LACTATE 12% LOTION: 12 | 34 days supply | Qty: 400 | Fill #2

## 2018-08-28 MED FILL — SPIRIVA 18 MCG CP-HANDIHALE: 18 | 30 days supply | Qty: 30 | Fill #2

## 2018-08-28 MED FILL — SYMBICORT 160-4.5 MCG INH: 160-4.5 | 30 days supply | Qty: 10 | Fill #0

## 2018-08-29 ENCOUNTER — Telehealth: Payer: Self-pay

## 2018-08-29 NOTE — Telephone Encounter (Signed)
-----   Message from Charlott Rakes, MD sent at 08/28/2018  1:48 PM EDT ----- Cholesterol is elevated. He is allergic to atorvastatin and so I have sent Zetia to his pharmacy which is not a statin. Advise to work on low cholesterol diet.

## 2018-08-29 NOTE — Telephone Encounter (Signed)
Patient was called and informed of lab results via voicemail. 

## 2018-09-09 DIAGNOSIS — J449 Chronic obstructive pulmonary disease, unspecified: Secondary | ICD-10-CM | POA: Diagnosis not present

## 2018-09-16 ENCOUNTER — Other Ambulatory Visit: Payer: Self-pay | Admitting: Pulmonary Disease

## 2018-09-16 MED FILL — ALBUTEROL SULFATE HFA 108 (: 108 (90 BAS | 16 days supply | Qty: 9 | Fill #2

## 2018-09-17 ENCOUNTER — Other Ambulatory Visit: Payer: Self-pay | Admitting: Pharmacist

## 2018-09-17 MED ORDER — BUDESONIDE-FORMOTEROL FUMARATE 160-4.5 MCG/ACT IN AERO: INHALATION_SPRAY | RESPIRATORY_TRACT | 0 refills | Status: DC

## 2018-09-23 MED FILL — SPIRIVA 18 MCG CP-HANDIHALE: 18 | 30 days supply | Qty: 30 | Fill #2

## 2018-09-23 MED FILL — METOPROLOL SUCCINATE ER 50: 50 | 60 days supply | Qty: 60 | Fill #3

## 2018-09-23 MED FILL — ALLOPURINOL 100 MG TABLET: 100 | 60 days supply | Qty: 60 | Fill #3

## 2018-09-23 MED FILL — AMLODIPINE BESYLATE 10 MG T: 10 | 60 days supply | Qty: 60 | Fill #3

## 2018-09-23 MED FILL — LOSARTAN POTASSIUM 50 MG TA: 50 | 60 days supply | Qty: 60 | Fill #3

## 2018-09-23 MED FILL — GABAPENTIN 300 MG CAPSULE: 300 | 30 days supply | Qty: 30 | Fill #3

## 2018-09-26 MED FILL — ALBUTEROL SULFATE HFA 108 (: 108 (90 BAS | 16 days supply | Qty: 9 | Fill #3

## 2018-09-26 MED FILL — SYMBICORT 160-4.5 MCG INH: 160-4.5 | 30 days supply | Qty: 10 | Fill #0

## 2018-10-09 DIAGNOSIS — J449 Chronic obstructive pulmonary disease, unspecified: Secondary | ICD-10-CM | POA: Diagnosis not present

## 2018-10-30 ENCOUNTER — Emergency Department (HOSPITAL_COMMUNITY)
Admission: EM | Admit: 2018-10-30 | Discharge: 2018-10-30 | Disposition: A | Discharge status: Home / Self Care | Payer: Medicaid Other | Attending: Emergency Medicine | Admitting: Emergency Medicine

## 2018-10-30 ENCOUNTER — Other Ambulatory Visit: Payer: Self-pay

## 2018-10-30 ENCOUNTER — Emergency Department (HOSPITAL_COMMUNITY): Payer: Medicaid Other

## 2018-10-30 DIAGNOSIS — R52 Pain, unspecified: Secondary | ICD-10-CM | POA: Diagnosis not present

## 2018-10-30 DIAGNOSIS — I129 Hypertensive chronic kidney disease with stage 1 through stage 4 chronic kidney disease, or unspecified chronic kidney disease: Secondary | ICD-10-CM | POA: Insufficient documentation

## 2018-10-30 DIAGNOSIS — J449 Chronic obstructive pulmonary disease, unspecified: Secondary | ICD-10-CM | POA: Insufficient documentation

## 2018-10-30 DIAGNOSIS — R252 Cramp and spasm: Secondary | ICD-10-CM | POA: Insufficient documentation

## 2018-10-30 DIAGNOSIS — Z79899 Other long term (current) drug therapy: Secondary | ICD-10-CM | POA: Diagnosis not present

## 2018-10-30 DIAGNOSIS — Z87891 Personal history of nicotine dependence: Secondary | ICD-10-CM | POA: Insufficient documentation

## 2018-10-30 DIAGNOSIS — N182 Chronic kidney disease, stage 2 (mild): Secondary | ICD-10-CM | POA: Insufficient documentation

## 2018-10-30 DIAGNOSIS — R109 Unspecified abdominal pain: Secondary | ICD-10-CM | POA: Diagnosis not present

## 2018-10-30 DIAGNOSIS — S3991XA Unspecified injury of abdomen, initial encounter: Secondary | ICD-10-CM | POA: Diagnosis not present

## 2018-10-30 DIAGNOSIS — S299XXA Unspecified injury of thorax, initial encounter: Secondary | ICD-10-CM | POA: Diagnosis not present

## 2018-10-30 DIAGNOSIS — M62830 Muscle spasm of back: Secondary | ICD-10-CM | POA: Diagnosis not present

## 2018-10-30 DIAGNOSIS — S3993XA Unspecified injury of pelvis, initial encounter: Secondary | ICD-10-CM | POA: Diagnosis not present

## 2018-10-30 DIAGNOSIS — T1490XA Injury, unspecified, initial encounter: Secondary | ICD-10-CM

## 2018-10-30 DIAGNOSIS — M62838 Other muscle spasm: Secondary | ICD-10-CM

## 2018-10-30 DIAGNOSIS — R1031 Right lower quadrant pain: Secondary | ICD-10-CM | POA: Diagnosis not present

## 2018-10-30 DIAGNOSIS — W19XXXA Unspecified fall, initial encounter: Secondary | ICD-10-CM | POA: Diagnosis not present

## 2018-10-30 LAB — URINALYSIS, ROUTINE W REFLEX MICROSCOPIC
Bacteria, UA: NONE SEEN
Bilirubin Urine: NEGATIVE
Glucose, UA: NEGATIVE mg/dL
Hgb urine dipstick: NEGATIVE
Ketones, ur: NEGATIVE mg/dL
Leukocytes,Ua: NEGATIVE
Nitrite: NEGATIVE
Protein, ur: 30 mg/dL — AB
Specific Gravity, Urine: 1.013 (ref 1.005–1.030)
pH: 6 (ref 5.0–8.0)

## 2018-10-30 LAB — COMPREHENSIVE METABOLIC PANEL
ALT: 15 U/L (ref 0–44)
AST: 17 U/L (ref 15–41)
Albumin: 3.7 g/dL (ref 3.5–5.0)
Alkaline Phosphatase: 88 U/L (ref 38–126)
Anion gap: 8 (ref 5–15)
BUN: 7 mg/dL (ref 6–20)
CO2: 32 mmol/L (ref 22–32)
Calcium: 9.5 mg/dL (ref 8.9–10.3)
Chloride: 101 mmol/L (ref 98–111)
Creatinine, Ser: 1.15 mg/dL (ref 0.61–1.24)
GFR calc Af Amer: >60 mL/min (ref >60)
GFR calc non Af Amer: >60 mL/min (ref >60)
Glucose, Bld: 90 mg/dL (ref 70–99)
Potassium: 4.4 mmol/L (ref 3.5–5.1)
Sodium: 141 mmol/L (ref 135–145)
Total Bilirubin: 0.9 mg/dL (ref 0.3–1.2)
Total Protein: 7.7 g/dL (ref 6.5–8.1)

## 2018-10-30 LAB — CBC WITH DIFFERENTIAL/PLATELET
Abs Immature Granulocytes: 0.04 10*3/uL (ref 0.00–0.07)
Basophils Absolute: 0.1 10*3/uL (ref 0.0–0.1)
Basophils Relative: 1 %
Eosinophils Absolute: 0.4 10*3/uL (ref 0.0–0.5)
Eosinophils Relative: 4 %
HCT: 47.1 % (ref 39.0–52.0)
Hemoglobin: 13.7 g/dL (ref 13.0–17.0)
Immature Granulocytes: 0 %
Lymphocytes Relative: 25 %
Lymphs Abs: 2.7 10*3/uL (ref 0.7–4.0)
MCH: 23.9 pg — ABNORMAL LOW (ref 26.0–34.0)
MCHC: 29.1 g/dL — ABNORMAL LOW (ref 30.0–36.0)
MCV: 82.1 fL (ref 80.0–100.0)
Monocytes Absolute: 1.2 10*3/uL — ABNORMAL HIGH (ref 0.1–1.0)
Monocytes Relative: 11 %
Neutro Abs: 6.4 10*3/uL (ref 1.7–7.7)
Neutrophils Relative %: 59 %
Platelets: 253 10*3/uL (ref 150–400)
RBC: 5.74 MIL/uL (ref 4.22–5.81)
RDW: 13.2 % (ref 11.5–15.5)
WBC: 10.8 10*3/uL — ABNORMAL HIGH (ref 4.0–10.5)
nRBC: 0 % (ref 0.0–0.2)

## 2018-10-30 LAB — LIPASE, BLOOD: Lipase: 38 U/L (ref 11–51)

## 2018-10-30 LAB — LACTIC ACID, PLASMA: Lactic Acid, Venous: 0.8 mmol/L (ref 0.5–1.9)

## 2018-10-30 MED ORDER — ALBUTEROL SULFATE HFA 108 (90 BASE) MCG/ACT IN AERS
2.0000 | INHALATION_SPRAY | Freq: Once | RESPIRATORY_TRACT | Status: AC
  Administered 2018-10-30: 2 via RESPIRATORY_TRACT
  Filled 2018-10-30: qty 6.7

## 2018-10-30 MED ORDER — CYCLOBENZAPRINE HCL 5 MG PO TABS: 5.0000 mg | ORAL_TABLET | Freq: Two times a day (BID) | ORAL | 0 refills | Status: DC | PRN

## 2018-10-30 MED ORDER — IOPAMIDOL (ISOVUE-300) INJECTION 61%
100.0000 mL | Freq: Once | INTRAVENOUS | Status: AC | PRN
  Administered 2018-10-30: 09:00:00 100 mL via INTRAVENOUS

## 2018-10-30 NOTE — ED Notes (Signed)
Pt given sprite and turkey sandwich 

## 2018-10-30 NOTE — ED Notes (Signed)
Family would like update [phone]

## 2018-10-30 NOTE — ED Notes (Signed)
Discharge instructions and prescription discussed with Pt. Pt verbalized understanding. Pt stable and leaving via WC with oxygen that his son brought.

## 2018-10-30 NOTE — ED Notes (Signed)
Family at bedside. 

## 2018-10-30 NOTE — ED Triage Notes (Signed)
Pt BIB [facility] d/t R.Lower flank pain since Monday. Denies constipation, N/V. Sister fell Saturday PM and Her Knee hit his R.Flank area  Hx 4L/O2 COPD, HTN  140/68 97.1f 98 % 80HR

## 2018-10-30 NOTE — ED Provider Notes (Signed)
[facility] EMERGENCY DEPARTMENT Provider Note   CSN: [id number] Arrival date & time: 10/30/18  6720     History   Chief Complaint Chief Complaint  Patient presents with   Flank Pain    HPI [name] [name] is a 58 y.o. male.     The history is provided by the patient and medical records. No language interpreter was used.  Flank Pain This is a new problem. The current episode started 2 days ago. The problem occurs constantly. The problem has not changed since onset.Associated symptoms include abdominal pain. Pertinent negatives include no chest pain, no headaches and no shortness of breath. Associated symptoms comments: R torso and R flank pain. The symptoms are aggravated by bending and twisting. The symptoms are relieved by position. He has tried nothing for the symptoms. The treatment provided no relief.    Past Medical History:  Diagnosis Date   Asthma 1999   CKD (chronic kidney disease), 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   COPD (chronic obstructive pulmonary disease) ([place]) 12/15/2013   Essential hypertension    Gout    Influenza with respiratory manifestations 07/30/2012   Pneumonia    Thrombocytopenia (HCC) 07/28/2012   Tobacco abuse 07/29/2012    Patient Active Problem List   Diagnosis Date Noted   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   COPD without exacerbation ([place]) 10/30/2016   Chronic respiratory failure with hypoxia ([place]) 10/30/2016   Obesity (BMI 30.0-34.9) 09/17/2015   Bilateral carpal tunnel syndrome 09/09/2015   Ulnar neuropathy of both upper extremities 09/09/2015   Numbness of left hand 01/21/2015   Abnormal EKG 10/27/2014   Moderate to severe pulmonary hypertension ([place]) 12/14/2013   Cocaine abuse in remission ([place]) 12/11/2013   Hypertension 12/11/2013   COPD bronchitis 08/08/2012   Gout 08/08/2012   CKD (chronic kidney disease) stage 3, GFR 30-59 ml/min (HCC) 08/03/2012   Tobacco abuse, in remission 07/29/2012    Past Surgical  History:  Procedure Laterality Date   FRACTURE SURGERY  childhood   right arm   KNEE SURGERY     right knee s/p trauma        Home Medications    Prior to Admission medications   Medication Sig Start Date End Date Taking? Authorizing Provider  albuterol (PROVENTIL HFA;VENTOLIN HFA) 108 (90 Base) MCG/ACT inhaler INHALE 2 PUFFS INTO THE LUNGS EVERY 4 HOURS AS NEEDED FOR WHEEZING OR SHORTNESS OF BREATH. 06/25/18   Newlin, Charlane Ferretti, MD  albuterol (PROVENTIL) (2.5 MG/3ML) 0.083% nebulizer solution INHALE 3 MLS (1 VIAL) EVERY 6 HOURS VIA NEBULIAZATION ROUTE AS NEEDED FOR SHORTNESS OF BREATH 06/25/18   Charlott Rakes, MD  allopurinol (ZYLOPRIM) 100 MG tablet Take 1 tablet (100 mg total) by mouth daily. 03/25/18   Charlott Rakes, MD  amLODipine (NORVASC) 10 MG tablet Take 1 tablet (10 mg total) by mouth daily. 03/25/18   Charlott Rakes, MD  budesonide-formoterol (SYMBICORT) 160-4.5 MCG/ACT inhaler Inhale 2 puffs into the lungs BID. Needs appt with Dr. Margarita Rana. 09/17/18   Charlott Rakes, MD  Cyanocobalamin (VITAMIN B-12) 1000 MCG SUBL Place 1 tablet (1,000 mcg total) under the tongue daily. 05/13/15   Narda Amber K, DO  DiphenhydrAMINE HCl (BENADRYL ALLERGY PO) Take by mouth.    [provider]  ezetimibe (ZETIA) 10 MG tablet Take 1 tablet (10 mg total) by mouth daily. 08/28/18   Charlott Rakes, MD  gabapentin (NEURONTIN) 300 MG capsule Take 1 capsule (300 mg total) by mouth at bedtime. 03/25/18   Charlott Rakes, MD  losartan (COZAAR) 50  MG tablet Take 1 tablet (50 mg total) by mouth daily. 03/25/18   Charlott Rakes, MD  metoprolol succinate (TOPROL-XL) 50 MG 24 hr tablet Take 1 tablet (50 mg total) by mouth daily. Take with or immediately following a meal. 03/25/18   Charlott Rakes, MD  tiotropium (SPIRIVA HANDIHALER) 18 MCG inhalation capsule PLACE 1 CAPSULE INTO INHALER AND INHALE DAILY 06/25/18   Charlott Rakes, MD  triamcinolone ointment (KENALOG) 0.1 % APPLY 1 APPLICATION  TOPICALLY 2 TIMES DAILY. 04/12/17   Charlott Rakes, MD  ALBUTEROL IN Inhale into the lungs.  07/03/11  [provider]    Family History Family History  Problem Relation Age of Onset   Colon cancer Unknown        mother ? age of diagnosis    Cancer Mother        colon    Heart disease Father    Healthy Son    Healthy Daughter     Social History Social History   Tobacco Use   Smoking status: Former Smoker    Packs/day: 1.00    Years: 20.00    Pack years: 20.00    Quit date: 12/15/2013    Years since quitting: 4.8   Smokeless tobacco: Never Used   Tobacco comment: 'Quitting"  Substance Use Topics   Alcohol use: Yes    Alcohol/week: 0.0 standard drinks    Comment: occaisional beer - previously one beer daily, stopped 4 months   Drug use: No    Types: Cocaine    Comment: 10/13/14: last use of cocaine one month ago      Allergies   Atorvastatin, Shellfish allergy, and Colchicine   Review of Systems Review of Systems  Constitutional: Negative for chills, diaphoresis, fatigue and fever.  HENT: Negative for congestion.   Respiratory: Negative for cough, chest tightness, shortness of breath and wheezing.   Cardiovascular: Negative for chest pain, palpitations and leg swelling.  Gastrointestinal: Positive for abdominal pain. Negative for constipation, diarrhea, nausea and vomiting.  Genitourinary: Positive for flank pain. Negative for dysuria and frequency.  Musculoskeletal: Positive for back pain. Negative for neck pain and neck stiffness.  Skin: Negative for rash and wound.  Neurological: Negative for light-headedness and headaches.  Psychiatric/Behavioral: Negative for agitation.  All other systems reviewed and are negative.    Physical Exam Updated Vital Signs BP 134/85 (BP Location: Right Arm)    Pulse 89    Temp 98.6 F (37 C) (Oral)    Resp (!) 25    Ht 5\' 7"  (1.702 m)    Wt 106.1 kg    SpO2 100%    BMI 36.65 kg/m   Physical Exam Vitals signs  and nursing note reviewed.  Constitutional:      General: He is not in acute distress.    Appearance: He is well-developed. He is not ill-appearing, toxic-appearing or diaphoretic.  HENT:     Head: Normocephalic and atraumatic.     Nose: No congestion or rhinorrhea.  Eyes:     Conjunctiva/sclera: Conjunctivae normal.     Pupils: Pupils are equal, round, and reactive to light.  Neck:     Musculoskeletal: Neck supple. No muscular tenderness.  Cardiovascular:     Rate and Rhythm: Normal rate and regular rhythm.     Heart sounds: No murmur.  Pulmonary:     Effort: Pulmonary effort is normal. No respiratory distress.     Breath sounds: Normal breath sounds.  Abdominal:     General: Abdomen is  flat.     Palpations: Abdomen is soft.     Tenderness: There is no abdominal tenderness. There is no right CVA tenderness, left CVA tenderness, guarding or rebound.    Musculoskeletal:        General: Tenderness present.     Lumbar back: He exhibits pain. He exhibits no laceration and no spasm.       Back:     Right lower leg: No edema.     Left lower leg: No edema.  Skin:    General: Skin is warm and dry.     Capillary Refill: Capillary refill takes less than 2 seconds.     Findings: No erythema.  Neurological:     Mental Status: He is alert.  Psychiatric:        Mood and Affect: Mood normal.      ED Treatments / Results  Labs (all labs ordered are listed, but only abnormal results are displayed) Labs Reviewed  CBC WITH DIFFERENTIAL/PLATELET - Abnormal; Notable for the following components:      Result Value   WBC 10.8 (*)    MCH 23.9 (*)    MCHC 29.1 (*)    Monocytes Absolute 1.2 (*)    All other components within normal limits  URINALYSIS, ROUTINE W REFLEX MICROSCOPIC - Abnormal; Notable for the following components:   Protein, ur 30 (*)    All other components within normal limits  URINE CULTURE  COMPREHENSIVE METABOLIC PANEL  LIPASE, BLOOD  LACTIC ACID, PLASMA     EKG None  Radiology Ct Chest W Contrast  Result Date: 10/30/2018 CLINICAL DATA:  Blunt abdominal trauma. EXAM: CT CHEST, ABDOMEN, AND PELVIS WITH CONTRAST TECHNIQUE: Multidetector CT imaging of the chest, abdomen and pelvis was performed following the standard protocol during bolus administration of intravenous contrast. CONTRAST:  152mL ISOVUE-300 IOPAMIDOL (ISOVUE-300) INJECTION 61% COMPARISON:  CT scan of December 13, 2013. FINDINGS: CT CHEST FINDINGS Cardiovascular: No significant vascular findings. Normal heart size. No pericardial effusion. Mediastinum/Nodes: No enlarged mediastinal, hilar, or axillary lymph nodes. Thyroid gland, trachea, and esophagus demonstrate no significant findings. Lungs/Pleura: No pneumothorax or pleural effusion is noted. Emphysematous disease is noted in both upper lobes. Minimal right posterior basilar subsegmental atelectasis is noted. Mild right middle lobe subsegmental atelectasis is noted. Minimal left posterior basilar subsegmental atelectasis is noted. Musculoskeletal: No chest wall mass or suspicious bone lesions identified. CT ABDOMEN PELVIS FINDINGS Hepatobiliary: No focal liver abnormality is seen. No gallstones, gallbladder wall thickening, or biliary dilatation. Pancreas: Unremarkable. No pancreatic ductal dilatation or surrounding inflammatory changes. Spleen: Normal in size without focal abnormality. Adrenals/Urinary Tract: Adrenal glands are unremarkable. Kidneys are normal, without renal calculi, focal lesion, or hydronephrosis. Bladder is unremarkable. Stomach/Bowel: Stomach is within normal limits. Appendix appears normal. No evidence of bowel wall thickening, distention, or inflammatory changes. Vascular/Lymphatic: Aortic atherosclerosis. No enlarged abdominal or pelvic lymph nodes. Reproductive: Prostate is unremarkable. Other: No abdominal wall hernia or abnormality. No abdominopelvic ascites. Musculoskeletal: No acute or significant osseous  findings. IMPRESSION: No evidence of traumatic injury seen in the chest, abdomen or pelvis. Bilateral subsegmental atelectasis is noted. Aortic Atherosclerosis (ICD10-I70.0) and Emphysema (ICD10-J43.9). Electronically Signed   By: Marijo Conception M.D.   On: 10/30/2018 09:53   Ct Abdomen Pelvis W Contrast  Result Date: 10/30/2018 CLINICAL DATA:  Blunt abdominal trauma. EXAM: CT CHEST, ABDOMEN, AND PELVIS WITH CONTRAST TECHNIQUE: Multidetector CT imaging of the chest, abdomen and pelvis was performed following the standard protocol during bolus administration of  intravenous contrast. CONTRAST:  122mL ISOVUE-300 IOPAMIDOL (ISOVUE-300) INJECTION 61% COMPARISON:  CT scan of December 13, 2013. FINDINGS: CT CHEST FINDINGS Cardiovascular: No significant vascular findings. Normal heart size. No pericardial effusion. Mediastinum/Nodes: No enlarged mediastinal, hilar, or axillary lymph nodes. Thyroid gland, trachea, and esophagus demonstrate no significant findings. Lungs/Pleura: No pneumothorax or pleural effusion is noted. Emphysematous disease is noted in both upper lobes. Minimal right posterior basilar subsegmental atelectasis is noted. Mild right middle lobe subsegmental atelectasis is noted. Minimal left posterior basilar subsegmental atelectasis is noted. Musculoskeletal: No chest wall mass or suspicious bone lesions identified. CT ABDOMEN PELVIS FINDINGS Hepatobiliary: No focal liver abnormality is seen. No gallstones, gallbladder wall thickening, or biliary dilatation. Pancreas: Unremarkable. No pancreatic ductal dilatation or surrounding inflammatory changes. Spleen: Normal in size without focal abnormality. Adrenals/Urinary Tract: Adrenal glands are unremarkable. Kidneys are normal, without renal calculi, focal lesion, or hydronephrosis. Bladder is unremarkable. Stomach/Bowel: Stomach is within normal limits. Appendix appears normal. No evidence of bowel wall thickening, distention, or inflammatory changes.  Vascular/Lymphatic: Aortic atherosclerosis. No enlarged abdominal or pelvic lymph nodes. Reproductive: Prostate is unremarkable. Other: No abdominal wall hernia or abnormality. No abdominopelvic ascites. Musculoskeletal: No acute or significant osseous findings. IMPRESSION: No evidence of traumatic injury seen in the chest, abdomen or pelvis. Bilateral subsegmental atelectasis is noted. Aortic Atherosclerosis (ICD10-I70.0) and Emphysema (ICD10-J43.9). Electronically Signed   By: Marijo Conception M.D.   On: 10/30/2018 09:53    Procedures Procedures (including critical care time)  Medications Ordered in ED Medications  albuterol (VENTOLIN HFA) 108 (90 Base) MCG/ACT inhaler 2 puff (2 puffs Inhalation Given 10/30/18 0755)  iopamidol (ISOVUE-300) 61 % injection 100 mL (100 mLs Intravenous Contrast Given 10/30/18 0921)     Initial Impression / Assessment and Plan / ED Course  I have reviewed the triage vital signs and the nursing notes.  Pertinent labs & imaging results that were available during my care of the patient were reviewed by me and considered in my medical decision making (see chart for details).        [name] [name] is a 58 y.o. male with a past medical history significant for COPD on 4 L home oxygen at baseline, hypertension, CKD, asthma, and obesity who presents with flank and torso pain after trauma.  Patient reports that 2 days ago he was playing with his granddaughter when she fell and kneed in the right flank and lower lateral right torso area.  He reports that the pain was mild initially but then worsened over the last 36 hours.  He reports the pain was severe this morning anytime he tries to bend, twist, or change position.  He reports his abdomen feels "more full".  He reports he has had normal bowel movements and has not had any hematuria or change in urine.  He reports that he frequently takes Azo to help with chronic urinary symptoms.  He reports no significant nausea, vomiting,  chest pain, or shortness of breath.  No pain with deep breathing.  He reports that he has never had any kidney laceration, liver laceration, or rib fractures.  He was very concerned about the pain and difficulty with bending prompting him to seek evaluation.  On exam, lungs are clear and chest is nontender.  Abdomen was minimally tender and had normal bowel sounds.  The patient's location of discomfort was in the right lateral torso, flank, and lateral abdomen.  Patient has dark complexion but there was not significant bruising on my initial evaluation.  Patient denied any numbness, tingling, weakness of legs.  Given the patient's location of trauma and his gradually worsening pain over the last few days I am concerned that he may have sort of liver laceration, kidney laceration, or retroperitoneal bleeding.  We will get labs and order CT imaging to further evaluate.  The patient's kidney function is incompatible with contrast, will obtain a noncontrast scan.  Patient's oxygen saturations are normal on his home 4 L.   Anticipate reassessment after work-up and imaging.       1:43 PM Patient's work-up was overall reassuring.  No evidence of acute traumatic injury on the imaging.  Patient reports feeling better.  Given his reassuring work-up, patient was all stable for discharge home with likely diagnosis of soft tissue injury and muscle spasm which was found on exam.  Patient will be given prescription for muscle relaxant and will follow-up with PCP.  He understood return precautions and was discharged in good condition.    Final Clinical Impressions(s) / ED Diagnoses   Final diagnoses:  Right flank pain  Trauma  Muscle spasm    ED Discharge Orders         Ordered    cyclobenzaprine (FLEXERIL) 5 MG tablet  2 times daily PRN     10/30/18 1342         Clinical Impression: 1. Right flank pain   2. Trauma   3. Muscle spasm     Disposition: Discharge  Condition: Good  I have  discussed the results, Dx and Tx plan with the pt(& family if present). He/she/they expressed understanding and agree(s) with the plan. Discharge instructions discussed at great length. Strict return precautions discussed and pt &/or family have verbalized understanding of the instructions. No further questions at time of discharge.    New Prescriptions   CYCLOBENZAPRINE (FLEXERIL) 5 MG TABLET    Take 1 tablet (5 mg total) by mouth 2 (two) times daily as needed for muscle spasms.    Follow Up: Charlott Rakes, MD [place] [id number] [phone]     [place] [address] [id number] mc [place]       Blenda Wisecup, Gwenyth Allegra, MD 10/30/18 1344

## 2018-10-30 NOTE — Discharge Instructions (Signed)
Your imaging today did not show evidence of acute traumatic injuries from the accidental trauma.  As your work-up is reassuring, we feel safe letting you go home.  Please follow-up with your primary doctor and use the muscle relaxant to help with the muscle spasms we discovered.  If any symptoms change or worsen, please return to the nearest emergency department.

## 2018-10-31 ENCOUNTER — Other Ambulatory Visit: Payer: Self-pay | Admitting: Family Medicine

## 2018-10-31 DIAGNOSIS — I10 Essential (primary) hypertension: Secondary | ICD-10-CM

## 2018-10-31 LAB — URINE CULTURE: Culture: NO GROWTH

## 2018-10-31 MED FILL — SPIRIVA 18 MCG CP-HANDIHALE: 18 | 30 days supply | Qty: 30 | Fill #3

## 2018-10-31 MED FILL — GABAPENTIN 300 MG CAPSULE: 300 | 30 days supply | Qty: 30 | Fill #4

## 2018-10-31 MED FILL — ALBUTEROL SUL 2.5 MG/3 ML S: (2.5 MG/3ML | 5 days supply | Qty: 75 | Fill #1

## 2018-10-31 MED FILL — ALBUTEROL SULFATE HFA 108 (: 108 (90 BAS | 16 days supply | Qty: 9 | Fill #4

## 2018-10-31 MED FILL — SYMBICORT 160-4.5 MCG INH: 160-4.5 | 30 days supply | Qty: 10 | Fill #0

## 2018-10-31 MED FILL — AMMONIUM LACTATE 12% LOTION: 12 | 30 days supply | Qty: 400 | Fill #3

## 2018-11-09 DIAGNOSIS — J449 Chronic obstructive pulmonary disease, unspecified: Secondary | ICD-10-CM | POA: Diagnosis not present

## 2018-12-05 ENCOUNTER — Other Ambulatory Visit: Payer: Self-pay | Admitting: Family Medicine

## 2018-12-05 ENCOUNTER — Other Ambulatory Visit: Payer: Self-pay | Admitting: Pulmonary Disease

## 2018-12-05 DIAGNOSIS — G5623 Lesion of ulnar nerve, bilateral upper limbs: Secondary | ICD-10-CM

## 2018-12-05 MED FILL — GABAPENTIN 300 MG CAPSULE: 300 | 30 days supply | Qty: 30 | Fill #3

## 2018-12-05 MED FILL — ALLOPURINOL 100 MG TABLET: 100 | 30 days supply | Qty: 30 | Fill #3

## 2018-12-05 MED FILL — AMLODIPINE BESYLATE 10 MG T: 10 | 30 days supply | Qty: 30 | Fill #0

## 2018-12-05 MED FILL — SPIRIVA 18 MCG CP-HANDIHALE: 18 | 30 days supply | Qty: 30 | Fill #4

## 2018-12-05 MED FILL — ALBUTEROL SULFATE HFA 108 (: 108 (90 BAS | 16 days supply | Qty: 9 | Fill #5

## 2018-12-05 MED FILL — EZETIMIBE 10 MG TAB: 10 | 90 days supply | Qty: 90 | Fill #1

## 2018-12-05 MED FILL — LOSARTAN POTASSIUM 50 MG TA: 50 | 30 days supply | Qty: 30 | Fill #0

## 2018-12-05 NOTE — Telephone Encounter (Signed)
Refill ready at [place].

## 2018-12-10 DIAGNOSIS — J449 Chronic obstructive pulmonary disease, unspecified: Secondary | ICD-10-CM | POA: Diagnosis not present

## 2018-12-10 MED FILL — METOPROLOL SUCCINATE ER 50: 50 | 30 days supply | Qty: 30 | Fill #0

## 2018-12-20 DIAGNOSIS — L409 Psoriasis, unspecified: Secondary | ICD-10-CM | POA: Diagnosis not present

## 2018-12-20 DIAGNOSIS — L403 Pustulosis palmaris et plantaris: Secondary | ICD-10-CM | POA: Diagnosis not present

## 2018-12-20 DIAGNOSIS — Z79899 Other long term (current) drug therapy: Secondary | ICD-10-CM | POA: Diagnosis not present

## 2018-12-30 ENCOUNTER — Encounter: Payer: Self-pay | Admitting: Family Medicine

## 2018-12-30 ENCOUNTER — Other Ambulatory Visit: Payer: Self-pay

## 2018-12-30 ENCOUNTER — Ambulatory Visit: Payer: Medicaid Other | Attending: Family Medicine | Admitting: Family Medicine

## 2018-12-30 DIAGNOSIS — J9611 Chronic respiratory failure with hypoxia: Secondary | ICD-10-CM | POA: Diagnosis not present

## 2018-12-30 DIAGNOSIS — G5623 Lesion of ulnar nerve, bilateral upper limbs: Secondary | ICD-10-CM

## 2018-12-30 DIAGNOSIS — J449 Chronic obstructive pulmonary disease, unspecified: Secondary | ICD-10-CM | POA: Diagnosis not present

## 2018-12-30 DIAGNOSIS — M1A9XX0 Chronic gout, unspecified, without tophus (tophi): Secondary | ICD-10-CM

## 2018-12-30 DIAGNOSIS — R21 Rash and other nonspecific skin eruption: Secondary | ICD-10-CM | POA: Diagnosis not present

## 2018-12-30 DIAGNOSIS — I10 Essential (primary) hypertension: Secondary | ICD-10-CM | POA: Diagnosis not present

## 2018-12-30 DIAGNOSIS — L301 Dyshidrosis [pompholyx]: Secondary | ICD-10-CM | POA: Diagnosis not present

## 2018-12-30 MED ORDER — METOPROLOL SUCCINATE ER 50 MG PO TB24: 50.0000 mg | ORAL_TABLET | Freq: Every day | ORAL | 6 refills | Status: DC

## 2018-12-30 MED ORDER — ALLOPURINOL 100 MG PO TABS: 100.0000 mg | ORAL_TABLET | Freq: Every day | ORAL | 6 refills | Status: DC

## 2018-12-30 MED ORDER — SPIRIVA HANDIHALER 18 MCG IN CAPS: 18.0000 ug | ORAL_CAPSULE | Freq: Every day | RESPIRATORY_TRACT | 6 refills | Status: DC

## 2018-12-30 MED ORDER — LOSARTAN POTASSIUM 50 MG PO TABS: 50.0000 mg | ORAL_TABLET | Freq: Every day | ORAL | 6 refills | Status: DC

## 2018-12-30 MED ORDER — BUDESONIDE-FORMOTEROL FUMARATE 160-4.5 MCG/ACT IN AERO: INHALATION_SPRAY | RESPIRATORY_TRACT | 6 refills | Status: DC

## 2018-12-30 MED ORDER — AMLODIPINE BESYLATE 10 MG PO TABS: 10.0000 mg | ORAL_TABLET | Freq: Every day | ORAL | 6 refills | Status: DC

## 2018-12-30 MED ORDER — HYDROCORTISONE 1 % EX OINT: 1.0000 "application " | TOPICAL_OINTMENT | Freq: Two times a day (BID) | CUTANEOUS | 0 refills | Status: DC

## 2018-12-30 MED ORDER — GABAPENTIN 300 MG PO CAPS: 300.0000 mg | ORAL_CAPSULE | Freq: Every day | ORAL | 6 refills | Status: DC

## 2018-12-30 MED ORDER — EZETIMIBE 10 MG PO TABS: 10.0000 mg | ORAL_TABLET | Freq: Every day | ORAL | 1 refills | Status: DC

## 2018-12-30 MED FILL — AMLODIPINE BESYLATE 10 MG T: 10 | 30 days supply | Qty: 30 | Fill #0

## 2018-12-30 MED FILL — SPIRIVA 18 MCG CP-HANDIHALE: 18 | 30 days supply | Qty: 30 | Fill #0

## 2018-12-30 MED FILL — ALLOPURINOL 100 MG TABLET: 100 | 30 days supply | Qty: 30 | Fill #0

## 2018-12-30 MED FILL — LOSARTAN POTASSIUM 50 MG TA: 50 | 30 days supply | Qty: 30 | Fill #0

## 2018-12-30 MED FILL — SYMBICORT 160-4.5 MCG INH: 160-4.5 | 30 days supply | Qty: 10 | Fill #0

## 2018-12-30 MED FILL — GABAPENTIN 300 MG CAPSULE: 300 | 30 days supply | Qty: 30 | Fill #0

## 2018-12-30 NOTE — Progress Notes (Signed)
Virtual Visit via Telephone Note  I connected with [name] [name], on 12/30/2018 at 4:30 PM by telephone due to the COVID-19 pandemic and verified that I am speaking with the correct person using two identifiers.   Consent: I discussed the limitations, risks, security and privacy concerns of performing an evaluation and management service by telephone and the availability of in person appointments. I also discussed with the patient that there may be a patient responsible charge related to this service. The patient expressed understanding and agreed to proceed.   Location of Patient: Home  Location of Provider: Chronic   Persons participating in Telemedicine visit: Trevel Niccoli Farrington-CMA Dr. Felecia Shelling     History of Present Illness: [name] [name] is a 58 year old male with a history of end-stage COPD (on 4 L of oxygen at rest and 8 L with ambulation), gout, ulnar neuropathy, hypertension, Psoriasis who presents today for a follow-up visit.   He complains of breaking out behind his ears where his oxygen tube hangs behind his ears and this is associated with burning which resolves whenever he takes off his oxygen.  He also needs a letter to the electric company so that in the event of power outage he receives priority and restoration of his power supply.  COPD has been stable and he has not seen pulmonary since last year. His ulnar neuropathy is controlled on gabapentin, he is compliant on his antihypertensive.  Denies additional concerns today and has been home a lot due to the pandemic.  Past Medical History:  Diagnosis Date  . Asthma 1999  . 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 COPD (chronic obstructive pulmonary disease) ([place]) 12/15/2013  . Essential hypertension   . Gout   . Influenza with respiratory manifestations 07/30/2012  . Pneumonia   . Thrombocytopenia ([place]) 07/28/2012  . Tobacco abuse 07/29/2012   Allergies  Allergen Reactions  . Atorvastatin  Itching and Swelling    Facial, tongue swelling  . Shellfish Allergy Anaphylaxis  . Colchicine Hives    Current Outpatient Medications on File Prior to Visit  Medication Sig Dispense Refill  . albuterol (PROVENTIL HFA;VENTOLIN HFA) 108 (90 Base) MCG/ACT inhaler INHALE 2 PUFFS INTO THE LUNGS EVERY 4 HOURS AS NEEDED FOR WHEEZING OR SHORTNESS OF BREATH. (Patient taking differently: Inhale 2 puffs into the lungs every 4 (four) hours as needed for wheezing. ) 8.5 g 2  . albuterol (PROVENTIL) (2.5 MG/3ML) 0.083% nebulizer solution INHALE 3 MLS (1 VIAL) EVERY 6 HOURS VIA NEBULIAZATION ROUTE AS NEEDED FOR SHORTNESS OF BREATH (Patient taking differently: Take 2.5 mg by nebulization every 6 (six) hours as needed for shortness of breath. ) 90 mL 2  . allopurinol (ZYLOPRIM) 100 MG tablet Take 1 tablet (100 mg total) by mouth daily. 30 tablet 6  . amLODipine (NORVASC) 10 MG tablet Take 1 tablet (10 mg total) by mouth daily. Needs PCP appointment. 30 tablet 0  . ammonium lactate (LAC-HYDRIN) 12 % lotion Apply 1 application topically daily.    . AZO-CRANBERRY PO Take 2 tablets by mouth every 30 (thirty) days.    . budesonide-formoterol (SYMBICORT) 160-4.5 MCG/ACT inhaler INAHLE 2 PUFFS INTO THE LUNGS TWICE DAILY. NEEDS TO MAKE APPT. WITH DR. Harlis Champoux 10.2 g 0  . Cyanocobalamin (VITAMIN B-12) 1000 MCG SUBL Place 1 tablet (1,000 mcg total) under the tongue daily. 30 tablet 11  . cyclobenzaprine (FLEXERIL) 5 MG tablet Take 1 tablet (5 mg total) by mouth 2 (two) times daily as needed for muscle spasms.  20 tablet 0  . diphenhydrAMINE (BENADRYL ALLERGY) 25 MG tablet Take 50 mg by mouth daily as needed (allergies).     . ezetimibe (ZETIA) 10 MG tablet Take 1 tablet (10 mg total) by mouth daily. 90 tablet 1  . gabapentin (NEURONTIN) 300 MG capsule Take 1 capsule (300 mg total) by mouth at bedtime. 30 capsule 6  . losartan (COZAAR) 50 MG tablet Take 1 tablet (50 mg total) by mouth daily. Needs PCP appointment. 30 tablet 0   . metoprolol succinate (TOPROL-XL) 50 MG 24 hr tablet Take 1 tablet (50 mg total) by mouth daily. Take with or immediately following a meal. Needs PCP appointment. 30 tablet 0  . tiotropium (SPIRIVA HANDIHALER) 18 MCG inhalation capsule PLACE 1 CAPSULE INTO INHALER AND INHALE DAILY (Patient taking differently: Place 18 mcg into inhaler and inhale daily. ) 30 capsule 2  . triamcinolone ointment (KENALOG) 0.1 % APPLY 1 APPLICATION TOPICALLY 2 TIMES DAILY. (Patient taking differently: Apply 1 application topically 2 (two) times daily. ) 80 g 0  . [DISCONTINUED] ALBUTEROL IN Inhale into the lungs.     No current facility-administered medications on file prior to visit.     Observations/Objective: Awake, alert, oriented x3 Not in acute distress  Assessment and Plan: 1. Chronic gout without tophus, unspecified cause, unspecified site No recent flares - allopurinol (ZYLOPRIM) 100 MG tablet; Take 1 tablet (100 mg total) by mouth daily.  Dispense: 30 tablet; Refill: 6  2. Essential hypertension Controlled Counseled on blood pressure goal of less than 130/80, low-sodium, DASH diet, medication compliance, 150 minutes of moderate intensity exercise per week. Discussed medication compliance, adverse effects. - amLODipine (NORVASC) 10 MG tablet; Take 1 tablet (10 mg total) by mouth daily.  Dispense: 30 tablet; Refill: 6 - losartan (COZAAR) 50 MG tablet; Take 1 tablet (50 mg total) by mouth daily. Needs PCP appointment.  Dispense: 30 tablet; Refill: 6 - metoprolol succinate (TOPROL-XL) 50 MG 24 hr tablet; Take 1 tablet (50 mg total) by mouth daily.  Dispense: 30 tablet; Refill: 6  3. COPD without exacerbation (HCC) Stable - tiotropium (SPIRIVA HANDIHALER) 18 MCG inhalation capsule; Place 1 capsule (18 mcg total) into inhaler and inhale daily.  Dispense: 30 capsule; Refill: 6 - budesonide-formoterol (SYMBICORT) 160-4.5 MCG/ACT inhaler; INAHLE 2 PUFFS INTO THE LUNGS TWICE DAILY.  Dispense: 10.2 g;  Refill: 6  4. Dyshidrotic eczema Followed by dermatology  5. Ulnar neuropathy of both upper extremities Stable - gabapentin (NEURONTIN) 300 MG capsule; Take 1 capsule (300 mg total) by mouth at bedtime.  Dispense: 30 capsule; Refill: 6  6. Rash He will be speaking with his DME company regarding changing of his oxygen tubing - hydrocortisone 1 % ointment; Apply 1 application topically 2 (two) times daily.  Dispense: 30 g; Refill: 0  7.  Chronic respiratory failure Currently on 4 L of oxygen Letter has been provided as per request  Follow Up Instructions: 6 months in person   I discussed the assessment and treatment plan with the patient. The patient was provided an opportunity to ask questions and all were answered. The patient agreed with the plan and demonstrated an understanding of the instructions.   The patient was advised to call back or seek an in-person evaluation if the symptoms worsen or if the condition fails to improve as anticipated.     I provided 15 minutes total of non-face-to-face time during this encounter including median intraservice time, reviewing previous notes, labs, imaging, medications, management and patient verbalized understanding.  Charlott Rakes, MD, FAAFP. [facility], [place]   12/30/2018, 4:30 PM

## 2018-12-30 NOTE — Progress Notes (Signed)
Patient has been called and DOB has been verified. Patient has been screened and transferred to PCP to start phone visit.    Patient states that he is breaking out to due to O2 tubing around ears and neck.   Patient needs letter stating that he uses O2 to send to electric company.  Refills on all medications.

## 2019-01-08 MED FILL — ALBUTEROL SULFATE HFA 108 (: 108 (90 BAS | 16 days supply | Qty: 9 | Fill #6

## 2019-01-08 MED FILL — METOPROLOL SUCCINATE ER 50: 50 | 30 days supply | Qty: 30 | Fill #0

## 2019-01-09 DIAGNOSIS — J449 Chronic obstructive pulmonary disease, unspecified: Secondary | ICD-10-CM | POA: Diagnosis not present

## 2019-02-09 DIAGNOSIS — J449 Chronic obstructive pulmonary disease, unspecified: Secondary | ICD-10-CM | POA: Diagnosis not present

## 2019-02-17 ENCOUNTER — Other Ambulatory Visit: Payer: Self-pay | Admitting: Family Medicine

## 2019-02-17 DIAGNOSIS — J449 Chronic obstructive pulmonary disease, unspecified: Secondary | ICD-10-CM

## 2019-02-17 MED FILL — SYMBICORT 160-4.5 MCG INH: 160-4.5 | 30 days supply | Qty: 10 | Fill #1

## 2019-02-17 MED FILL — GABAPENTIN 300 MG CAPSULE: 300 | 30 days supply | Qty: 30 | Fill #1

## 2019-02-17 MED FILL — SPIRIVA 18 MCG CP-HANDIHALE: 18 | 30 days supply | Qty: 30 | Fill #5

## 2019-02-17 MED FILL — AMLODIPINE BESYLATE 10 MG T: 10 | 30 days supply | Qty: 30 | Fill #1

## 2019-02-17 MED FILL — METOPROLOL SUCCINATE ER 50: 50 | 30 days supply | Qty: 30 | Fill #1

## 2019-02-17 MED FILL — LOSARTAN POTASSIUM 50 MG TA: 50 | 30 days supply | Qty: 30 | Fill #1

## 2019-02-17 MED FILL — ALLOPURINOL 100 MG TABLET: 100 | 30 days supply | Qty: 30 | Fill #1

## 2019-02-17 NOTE — Telephone Encounter (Signed)
Please fill if appropriate.Marland Kitchen all other medications refilled 12/21/2018

## 2019-02-18 MED FILL — ALBUTEROL SULFATE HFA 108 (: 108 (90 BAS | 16 days supply | Qty: 9 | Fill #0

## 2019-03-11 DIAGNOSIS — J449 Chronic obstructive pulmonary disease, unspecified: Secondary | ICD-10-CM | POA: Diagnosis not present

## 2019-03-24 ENCOUNTER — Encounter: Payer: Self-pay | Admitting: Pulmonary Disease

## 2019-03-24 ENCOUNTER — Other Ambulatory Visit: Payer: Self-pay

## 2019-03-24 ENCOUNTER — Ambulatory Visit (INDEPENDENT_AMBULATORY_CARE_PROVIDER_SITE_OTHER): Payer: Medicaid Other | Admitting: Pulmonary Disease

## 2019-03-24 DIAGNOSIS — I2722 Pulmonary hypertension due to left heart disease: Secondary | ICD-10-CM

## 2019-03-24 DIAGNOSIS — Z9981 Dependence on supplemental oxygen: Secondary | ICD-10-CM

## 2019-03-24 DIAGNOSIS — J449 Chronic obstructive pulmonary disease, unspecified: Secondary | ICD-10-CM

## 2019-03-24 DIAGNOSIS — R0902 Hypoxemia: Secondary | ICD-10-CM

## 2019-03-24 NOTE — Progress Notes (Signed)
Virtual Visit via Telephone Note  I connected with [name] [name] on 03/24/19 at 11:45 AM EST by telephone and verified that I am speaking with the correct person using two identifiers.  Location: Patient: Home Provider:  Pulmonary, [place], [place]   I discussed the limitations, risks, security and privacy concerns of performing an evaluation and management service by telephone and the availability of in person appointments. I also discussed with the patient that there may be a patient responsible charge related to this service. The patient expressed understanding and agreed to proceed.  Chief complaint:   Follow up for COPD GOLD B (Cat score 17, 0 exacerbations) Pulmonary HTN  HPI: [name] [name] is a 58 year old with extensive smoking history. His main complaint is dyspnea on exertion for the past several years. He has chronic cough with mucus production. Denies any wheezing, hemoptysis. He was maintained initially on just albuterol rescue inhaler. He is maintained on spiriva. He feels very currently. He has dyspnea on exertion which is not different from baseline. Denies any cough, sputum production, hemoptysis, fever, weight loss.  Interim history: Continues on symbicort and spiriva. States that breathing is doing well.    Outpatient Encounter Medications as of 03/24/2019  Medication Sig  . albuterol (PROVENTIL) (2.5 MG/3ML) 0.083% nebulizer solution INHALE 3 MLS (1 VIAL) EVERY 6 HOURS VIA NEBULIAZATION ROUTE AS NEEDED FOR SHORTNESS OF BREATH (Patient taking differently: Take 2.5 mg by nebulization every 6 (six) hours as needed for shortness of breath. )  . albuterol (VENTOLIN HFA) 108 (90 Base) MCG/ACT inhaler INHALE 2 PUFFS INTO THE LUNGS EVERY 4 HOURS AS NEEDED FOR WHEEZING OR SHORTNESS OF BREATH.  Marland Kitchen allopurinol (ZYLOPRIM) 100 MG tablet Take 1 tablet (100 mg total) by mouth daily.  Marland Kitchen amLODipine (NORVASC) 10 MG tablet Take 1 tablet (10 mg total) by mouth daily.  Marland Kitchen  ammonium lactate (LAC-HYDRIN) 12 % lotion Apply 1 application topically daily.  . AZO-CRANBERRY PO Take 2 tablets by mouth every 30 (thirty) days.  . budesonide-formoterol (SYMBICORT) 160-4.5 MCG/ACT inhaler INAHLE 2 PUFFS INTO THE LUNGS TWICE DAILY.  Marland Kitchen Cyanocobalamin (VITAMIN B-12) 1000 MCG SUBL Place 1 tablet (1,000 mcg total) under the tongue daily.  . cyclobenzaprine (FLEXERIL) 5 MG tablet Take 1 tablet (5 mg total) by mouth 2 (two) times daily as needed for muscle spasms.  . diphenhydrAMINE (BENADRYL ALLERGY) 25 MG tablet Take 50 mg by mouth daily as needed (allergies).   . ezetimibe (ZETIA) 10 MG tablet Take 1 tablet (10 mg total) by mouth daily.  Marland Kitchen gabapentin (NEURONTIN) 300 MG capsule Take 1 capsule (300 mg total) by mouth at bedtime.  . hydrocortisone 1 % ointment Apply 1 application topically 2 (two) times daily.  Marland Kitchen losartan (COZAAR) 50 MG tablet Take 1 tablet (50 mg total) by mouth daily. Needs PCP appointment.  . metoprolol succinate (TOPROL-XL) 50 MG 24 hr tablet Take 1 tablet (50 mg total) by mouth daily.  Marland Kitchen tiotropium (SPIRIVA HANDIHALER) 18 MCG inhalation capsule Place 1 capsule (18 mcg total) into inhaler and inhale daily.  Marland Kitchen triamcinolone ointment (KENALOG) 0.1 % APPLY 1 APPLICATION TOPICALLY 2 TIMES DAILY. (Patient taking differently: Apply 1 application topically 2 (two) times daily. )  . [DISCONTINUED] ALBUTEROL IN Inhale into the lungs.   No facility-administered encounter medications on file as of 03/24/2019.   Physical Exam: Televisit  Data Reviewed: Imaging CTA scan 12/13/13-no PE, mild airway thickening, atelectasis, cardiomegaly  CT chest 10/30/2018-emphysema in the upper lobes.  Mild lower  lobe atelectasis I have reviewed the images personally.  PFTs. 4/13/17VC 2.09 [PERCENT) FEV1 1.04 (36%) F/F 50 DLCO 38% Severe obstructive lung disease with reduction in diffusion capacity.  Cardiac Echo 10/27/14 Normal LV function; mild LVH and LVE; grade 1  diastolic dysfunction; moderate LAE; mild RAE; trace TR; severely elevated pulmonary pressure. PAP 72  Echo 07/21/2016 LVEF 123456, grade 1 diastolic dysfunction, PAP 42  Sleep study 06/15/15 No significant obstructive sleep apnea, moderate O2 desaturation  Labs Alpha-1 antitrypsin 06/16/2016-137, PI MM HIV 07/06/2016-negative SSB 7.9, double-stranded DNA, SSA, rheumatoid factor-negative Hepatic function panel 07/17/2017-negative  Assessment:  Severe COPD Continues on Spiriva.  Will add Breo Continue albuterol rescue inhaler Alpha-1 antitrypsin levels are normal.   He will need a qualifying visit to renew his oxygen and will arrange an appointment.  Pulmonary hypertension. 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  Continue supplemental oxygen Repeat echo in 2018 shows improvement in PA pressures Work-up for alternative PAH etiologies including HIV, LFTs, ANA, RF, CCP is negative.  He has mild elevation in SSB which is likely nonspecific.  Heavy ex-smoker. He is a candidate for low-dose screening CT. I will discuss with him at his next visit.  Health maintenance Prevnar 12/22/2013 Pneumovax 07/30/2012  Plan/Recommendations: - Continue Symbicort, Spiriva, supplemental oxygen -Walk test to qualify for supplemental oxygen  [name] MD [facility] 03/24/2019, 11:51 AM  CC: Charlott Rakes, MD

## 2019-03-27 MED FILL — SYMBICORT 160-4.5 MCG INH: 160-4.5 | 30 days supply | Qty: 10 | Fill #2

## 2019-03-27 MED FILL — ALLOPURINOL 100 MG TABLET: 100 | 30 days supply | Qty: 30 | Fill #2

## 2019-03-27 MED FILL — PROAIR HFA 90 MCG INHALER: 108 (90 BAS | 16 days supply | Qty: 9 | Fill #1

## 2019-03-27 MED FILL — SPIRIVA 18 MCG CP-HANDIHALE: 18 | 30 days supply | Qty: 30 | Fill #0

## 2019-03-27 MED FILL — METOPROLOL SUCCINATE ER 50: 50 | 30 days supply | Qty: 30 | Fill #2

## 2019-03-27 MED FILL — EZETIMIBE 10 MG TAB: 10 | 30 days supply | Qty: 30 | Fill #0

## 2019-03-27 MED FILL — LOSARTAN POTASSIUM 50 MG TA: 50 | 30 days supply | Qty: 30 | Fill #2

## 2019-03-27 MED FILL — GABAPENTIN 300 MG CAPSULE: 300 | 30 days supply | Qty: 30 | Fill #2

## 2019-03-27 MED FILL — AMLODIPINE BESYLATE 10 MG T: 10 | 30 days supply | Qty: 30 | Fill #2

## 2019-03-28 ENCOUNTER — Ambulatory Visit: Payer: Medicaid Other | Admitting: Pulmonary Disease

## 2019-04-11 DIAGNOSIS — J449 Chronic obstructive pulmonary disease, unspecified: Secondary | ICD-10-CM | POA: Diagnosis not present

## 2019-04-17 ENCOUNTER — Ambulatory Visit (INDEPENDENT_AMBULATORY_CARE_PROVIDER_SITE_OTHER): Payer: Medicaid Other | Admitting: Pulmonary Disease

## 2019-04-17 ENCOUNTER — Encounter: Payer: Self-pay | Admitting: Pulmonary Disease

## 2019-04-17 ENCOUNTER — Ambulatory Visit: Payer: Medicaid Other | Admitting: Pulmonary Disease

## 2019-04-17 ENCOUNTER — Other Ambulatory Visit: Payer: Self-pay

## 2019-04-17 VITALS — BP 122/80 | HR 70 | Temp 97.1°F | Ht 67.0 in | Wt 235.0 lb

## 2019-04-17 DIAGNOSIS — J449 Chronic obstructive pulmonary disease, unspecified: Secondary | ICD-10-CM | POA: Diagnosis not present

## 2019-04-17 DIAGNOSIS — Z7689 Persons encountering health services in other specified circumstances: Secondary | ICD-10-CM | POA: Diagnosis not present

## 2019-04-17 NOTE — Patient Instructions (Signed)
Continue your inhalers as prescribed We will refer you to pharmacy for inhaler management and training We will refer you for low-dose screening CT of the chest Qualify for oxygen today Follow-up in 6 months.

## 2019-04-17 NOTE — Addendum Note (Signed)
Addended by: Hildred Alamin I on: 04/17/2019 03:37 PM   Modules accepted: Orders

## 2019-04-17 NOTE — Progress Notes (Signed)
[name] [name]    RL:[id number]    06-30-1960  Primary Care Physician:Newlin, Charlane Ferretti, MD  Referring Physician: Charlott Rakes, MD [address] [place],  [place] [address]  Chief complaint:   Follow up for COPD GOLD B (Cat score 17, 0 exacerbations) Pulmonary HTN  HPI: Mr. Blandin is a 59 year old with extensive smoking history. His main complaint is dyspnea on exertion for the past several years. He has chronic cough with mucus production. Denies any wheezing, hemoptysis. He was maintained initially on just albuterol rescue inhaler. He is maintained on spiriva. He feels very currently. He has dyspnea on exertion which is not different from baseline. Denies any cough, sputum production, hemoptysis, fever, weight loss.  Interim history: Continues on Spiriva.  Feels about the same.  Gets dyspneic on exertion, mild symptoms at rest.  He has chronic cough with white mucus production.  Outpatient Encounter Medications as of 04/17/2019  Medication Sig  . albuterol (PROVENTIL) (2.5 MG/3ML) 0.083% nebulizer solution INHALE 3 MLS (1 VIAL) EVERY 6 HOURS VIA NEBULIAZATION ROUTE AS NEEDED FOR SHORTNESS OF BREATH (Patient taking differently: Take 2.5 mg by nebulization every 6 (six) hours as needed for shortness of breath. )  . albuterol (VENTOLIN HFA) 108 (90 Base) MCG/ACT inhaler INHALE 2 PUFFS INTO THE LUNGS EVERY 4 HOURS AS NEEDED FOR WHEEZING OR SHORTNESS OF BREATH.  Marland Kitchen allopurinol (ZYLOPRIM) 100 MG tablet Take 1 tablet (100 mg total) by mouth daily.  Marland Kitchen amLODipine (NORVASC) 10 MG tablet Take 1 tablet (10 mg total) by mouth daily.  Marland Kitchen ammonium lactate (LAC-HYDRIN) 12 % lotion Apply 1 application topically daily.  . AZO-CRANBERRY PO Take 2 tablets by mouth every 30 (thirty) days.  . budesonide-formoterol (SYMBICORT) 160-4.5 MCG/ACT inhaler INAHLE 2 PUFFS INTO THE LUNGS TWICE DAILY.  Marland Kitchen Cyanocobalamin (VITAMIN B-12) 1000 MCG SUBL Place 1 tablet (1,000 mcg total) under the tongue daily.  .  cyclobenzaprine (FLEXERIL) 5 MG tablet Take 1 tablet (5 mg total) by mouth 2 (two) times daily as needed for muscle spasms.  . diphenhydrAMINE (BENADRYL ALLERGY) 25 MG tablet Take 50 mg by mouth daily as needed (allergies).   . ezetimibe (ZETIA) 10 MG tablet Take 1 tablet (10 mg total) by mouth daily.  Marland Kitchen gabapentin (NEURONTIN) 300 MG capsule Take 1 capsule (300 mg total) by mouth at bedtime.  . hydrocortisone 1 % ointment Apply 1 application topically 2 (two) times daily.  Marland Kitchen losartan (COZAAR) 50 MG tablet Take 1 tablet (50 mg total) by mouth daily. Needs PCP appointment.  . metoprolol succinate (TOPROL-XL) 50 MG 24 hr tablet Take 1 tablet (50 mg total) by mouth daily.  Marland Kitchen tiotropium (SPIRIVA HANDIHALER) 18 MCG inhalation capsule Place 1 capsule (18 mcg total) into inhaler and inhale daily.  Marland Kitchen triamcinolone ointment (KENALOG) 0.1 % APPLY 1 APPLICATION TOPICALLY 2 TIMES DAILY. (Patient taking differently: Apply 1 application topically 2 (two) times daily. )  . [DISCONTINUED] ALBUTEROL IN Inhale into the lungs.   No facility-administered encounter medications on file as of 04/17/2019.   Physical Exam: Blood pressure 132/84, pulse 84, height 5' 8.5" (1.74 m), weight 222 lb 6.4 oz (100.9 kg), SpO2 94 %. Gen:      No acute distress HEENT:  EOMI, sclera anicteric Neck:     No masses; no thyromegaly Lungs:    Clear to auscultation bilaterally; normal respiratory effort CV:         Regular rate and rhythm; no murmurs Abd:      +  bowel sounds; soft, non-tender; no palpable masses, no distension Ext:    No edema; adequate peripheral perfusion Skin:      Warm and dry; no rash Neuro: alert and oriented x 3 Psych: normal mood and affect  Data Reviewed: Imaging CTA scan 12/13/13-no PE, mild airway thickening, atelectasis, cardiomegaly   CT chest 10/30/2018-emphysema in the upper lobes.  Mild lower lobe atelectasis I have reviewed the images personally.  PFTs. 07/22/15 2.19 [60%], FEV1 1.07 [37%],/49,  DLCO 10.99 [38%] Severe obstruction with severe diffusion impairment  Cardiac Echo 10/27/14 Normal LV function; mild LVH and LVE; grade 1 diastolic dysfunction; moderate LAE; mild RAE; trace TR; severely elevated pulmonary pressure. PAP 72  Echo 07/21/2016 LVEF 123456, grade 1 diastolic dysfunction, PAP 42  Sleep study 06/15/15 No significant obstructive sleep apnea, moderate O2 desaturation  Labs Alpha-1 antitrypsin 06/16/2016-137, PI MM HIV 07/06/2016-negative SSB 7.9, double-stranded DNA, SSA, rheumatoid factor-negative Hepatic function panel 07/17/2017-negative  Assessment:  Severe COPD Continue Symbicort, Spiriva, rescue inhaler Alpha-1 antitrypsin levels are normal.  Re qualify for supplemental oxygen  Pulmonary hypertension. 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  Continue supplemental oxygen Repeat echo in 2018 shows improvement in PA pressures Work-up for alternative PAH etiologies including HIV, LFTs, ANA, RF, CCP is negative.  He has mild elevation in SSB which is likely nonspecific. We will continue to monitor  Heavy ex-smoker. Referral for low-dose screening CT of the chest  Health maintenance Prevnar 12/22/2013 Pneumovax 07/30/2012 He does not want to get the flu vaccine.  Plan/Recommendations: - Continue spiriva, symbicort - Continue supplemental O2 - Low dose screening CT chest.   [name] MD [facility] 04/17/2019, 3:01 PM  CC: Charlott Rakes, MD

## 2019-04-30 MED FILL — PROAIR HFA 90 MCG INHALER: 108 (90 BAS | 16 days supply | Qty: 9 | Fill #2

## 2019-04-30 MED FILL — GABAPENTIN 300 MG CAPSULE: 300 | 30 days supply | Qty: 30 | Fill #3

## 2019-04-30 MED FILL — METOPROLOL SUCCINATE ER 50: 50 | 30 days supply | Qty: 30 | Fill #3

## 2019-04-30 MED FILL — SPIRIVA 18 MCG CP-HANDIHALE: 18 | 30 days supply | Qty: 30 | Fill #1

## 2019-04-30 MED FILL — EZETIMIBE 10 MG TAB: 10 | 30 days supply | Qty: 30 | Fill #1

## 2019-04-30 MED FILL — SYMBICORT 160-4.5 MCG INH: 160-4.5 | 30 days supply | Qty: 10 | Fill #3

## 2019-04-30 MED FILL — ALLOPURINOL 100 MG TABLET: 100 | 30 days supply | Qty: 30 | Fill #3

## 2019-04-30 MED FILL — AMLODIPINE BESYLATE 10 MG T: 10 | 30 days supply | Qty: 30 | Fill #3

## 2019-04-30 MED FILL — LOSARTAN POTASSIUM 50 MG TA: 50 | 30 days supply | Qty: 30 | Fill #3

## 2019-05-12 DIAGNOSIS — J449 Chronic obstructive pulmonary disease, unspecified: Secondary | ICD-10-CM | POA: Diagnosis not present

## 2019-05-19 ENCOUNTER — Ambulatory Visit: Payer: Medicaid Other

## 2019-06-09 DIAGNOSIS — J449 Chronic obstructive pulmonary disease, unspecified: Secondary | ICD-10-CM | POA: Diagnosis not present

## 2019-06-09 MED FILL — EZETIMIBE 10 MG TAB: 10 | 30 days supply | Qty: 30 | Fill #2

## 2019-06-09 MED FILL — LOSARTAN POTASSIUM 50 MG TA: 50 | 30 days supply | Qty: 30 | Fill #4

## 2019-06-09 MED FILL — SPIRIVA 18 MCG CP-HANDIHALE: 18 | 30 days supply | Qty: 30 | Fill #2

## 2019-06-09 MED FILL — PROAIR HFA 90 MCG INHALER: 108 (90 BAS | 16 days supply | Qty: 9 | Fill #3

## 2019-06-09 MED FILL — AMLODIPINE BESYLATE 10 MG T: 10 | 30 days supply | Qty: 30 | Fill #4

## 2019-06-09 MED FILL — SYMBICORT 160-4.5 MCG INH: 160-4.5 | 30 days supply | Qty: 10 | Fill #4

## 2019-06-09 MED FILL — ALLOPURINOL 100 MG TABLET: 100 | 30 days supply | Qty: 30 | Fill #4

## 2019-06-09 MED FILL — METOPROLOL SUCCINATE ER 50: 50 | 30 days supply | Qty: 30 | Fill #4

## 2019-06-20 ENCOUNTER — Telehealth: Payer: Self-pay | Admitting: Pulmonary Disease

## 2019-06-20 NOTE — Telephone Encounter (Signed)
ATC pt, no answer. Left message for pt to call back.  I called [facility] and waited on hold to speak to someone for over 15 mins. I left a voicemail so they could call me back, Will leave open until we speak to [place].

## 2019-06-23 NOTE — Telephone Encounter (Signed)
Below is the message I sent to [employer]:  Anderson Malta,   I was wondering if you could look into this for [place]. I saw the community message that you sent to [place] at our office in regards to new testing and current OV notes needed as well as a prescription for pt's O2. Checked and saw that Mr. Mound was in office 04/17/19 and he did a qualification walk that day at the office and he did qualify for O2 at that visit.   I see that Hildred Alamin whom was working with Dr. Vaughan Browner that day did place pt's sats in the care coordination note. I do not see that she placed prescription order for this.   Can you check to see if the Rx is the only thing that you are needing?    Response received from [employer]: Ina Kick, Waldemar Dickens, [place]; Darlina Guys; Elane Fritz, unfortunately, the sats done on 1/7 are too old. They have to be within 30 days. Same with the office visit notes. So the patient will need to be scheduled for another appointment to have this done.  Please let us know when this documentation is all available so that we can get it pulled and to our Authorization team in a timely manner.  Thank you  Sonia Baller        Attempted to call pt but unable to reach. Left message for pt to return call.

## 2019-06-23 NOTE — Telephone Encounter (Signed)
Loni Beckwith, Mylo Red, Lanell Matar, [place]; Tomi Bamberger, this is not a requalification...it is for new authorization for insurance to continue to pay. We would need:  -Rx for O2  -New testing  -Current office visit notes to speak of the patients O2 usage, etc.

## 2019-06-23 NOTE — Telephone Encounter (Signed)
Sent a community message to [place] at [employer]. Will await response.

## 2019-06-23 NOTE — Telephone Encounter (Signed)
Checked pt's last OV with Dr. Vaughan Browner from 04/17/19 and in that [place], Dr. Vaughan Browner had pt do a qualification walk for O2 and pt was qualified for his O2.  I am going to send a community message to [employer] in regards to this as pt was just in office in January and did have testing performed that day for O2. Pt's sats are in the care coordination note. After pt was qualified, there was not a prescription that was placed on 04/17/19.  Will wait for a reply to my community message to [employer] in regards to this to see what info they are still needing. Once I have received a response, will update.

## 2019-06-24 NOTE — Telephone Encounter (Signed)
lmtcb for pt.  

## 2019-06-25 NOTE — Telephone Encounter (Signed)
LMTCB x3 for pt. We have attempted to contact pt several times with no success or call back from pt. Per triage protocol, message will be closed.   

## 2019-07-02 ENCOUNTER — Telehealth: Payer: Self-pay | Admitting: Pulmonary Disease

## 2019-07-02 NOTE — Telephone Encounter (Signed)
FYI: I spoke with pt and he has decided not to participate in the lung screening program. Referral has been cancelled.

## 2019-07-10 DIAGNOSIS — J449 Chronic obstructive pulmonary disease, unspecified: Secondary | ICD-10-CM | POA: Diagnosis not present

## 2019-07-14 MED FILL — EZETIMIBE 10 MG TAB: 10 | 30 days supply | Qty: 30 | Fill #3

## 2019-07-14 MED FILL — ALLOPURINOL 100 MG TABLET: 100 | 30 days supply | Qty: 30 | Fill #5

## 2019-07-14 MED FILL — AMLODIPINE BESYLATE 10 MG T: 10 | 30 days supply | Qty: 30 | Fill #5

## 2019-07-14 MED FILL — SYMBICORT 160-4.5 MCG INH: 160-4.5 | 30 days supply | Qty: 10 | Fill #5

## 2019-07-14 MED FILL — METOPROLOL SUCCINATE ER 50: 50 | 30 days supply | Qty: 30 | Fill #5

## 2019-07-14 MED FILL — SPIRIVA 18 MCG CP-HANDIHALE: 18 | 30 days supply | Qty: 30 | Fill #3

## 2019-07-14 MED FILL — LOSARTAN POTASSIUM 50 MG TA: 50 | 30 days supply | Qty: 30 | Fill #5

## 2019-07-14 MED FILL — PROAIR HFA 90 MCG INHALER: 108 (90 BAS | 16 days supply | Qty: 9 | Fill #4

## 2019-08-09 DIAGNOSIS — J449 Chronic obstructive pulmonary disease, unspecified: Secondary | ICD-10-CM | POA: Diagnosis not present

## 2019-08-25 MED FILL — SYMBICORT 160-4.5 MCG INH: 160-4.5 | 30 days supply | Qty: 10 | Fill #6

## 2019-08-25 MED FILL — SPIRIVA 18 MCG CP-HANDIHALE: 18 | 30 days supply | Qty: 30 | Fill #4

## 2019-08-25 MED FILL — AMLODIPINE BESYLATE 10 MG T: 10 | 30 days supply | Qty: 30 | Fill #6

## 2019-08-25 MED FILL — PROAIR HFA 90 MCG INHALER: 108 (90 BAS | 16 days supply | Qty: 9 | Fill #5

## 2019-08-25 MED FILL — ALLOPURINOL 100 MG TABLET: 100 | 30 days supply | Qty: 30 | Fill #6

## 2019-08-25 MED FILL — EZETIMIBE 10 MG TAB: 10 | 30 days supply | Qty: 30 | Fill #4

## 2019-08-25 MED FILL — LOSARTAN POTASSIUM 50 MG TA: 50 | 30 days supply | Qty: 30 | Fill #6

## 2019-08-25 MED FILL — METOPROLOL SUCCINATE ER 50: 50 | 30 days supply | Qty: 30 | Fill #6

## 2019-09-09 DIAGNOSIS — J449 Chronic obstructive pulmonary disease, unspecified: Secondary | ICD-10-CM | POA: Diagnosis not present

## 2019-09-29 ENCOUNTER — Other Ambulatory Visit: Payer: Self-pay | Admitting: Family Medicine

## 2019-09-29 DIAGNOSIS — I10 Essential (primary) hypertension: Secondary | ICD-10-CM

## 2019-09-29 DIAGNOSIS — J449 Chronic obstructive pulmonary disease, unspecified: Secondary | ICD-10-CM

## 2019-09-29 DIAGNOSIS — M1A9XX0 Chronic gout, unspecified, without tophus (tophi): Secondary | ICD-10-CM

## 2019-09-29 MED FILL — SPIRIVA 18 MCG CP-HANDIHALE: 18 | 30 days supply | Qty: 30 | Fill #5

## 2019-09-29 MED FILL — EZETIMIBE 10 MG TAB: 10 | 30 days supply | Qty: 30 | Fill #5

## 2019-09-29 MED FILL — PROAIR HFA 90 MCG INHALER: 108 (90 BAS | 16 days supply | Qty: 9 | Fill #6

## 2019-10-02 ENCOUNTER — Other Ambulatory Visit: Payer: Self-pay | Admitting: Family Medicine

## 2019-10-02 ENCOUNTER — Telehealth: Payer: Self-pay | Admitting: Family Medicine

## 2019-10-02 DIAGNOSIS — I10 Essential (primary) hypertension: Secondary | ICD-10-CM

## 2019-10-02 DIAGNOSIS — M1A9XX0 Chronic gout, unspecified, without tophus (tophi): Secondary | ICD-10-CM

## 2019-10-02 MED ORDER — METOPROLOL SUCCINATE ER 50 MG PO TB24: 50.0000 mg | ORAL_TABLET | Freq: Every day | ORAL | 0 refills | Status: DC

## 2019-10-02 MED ORDER — AMLODIPINE BESYLATE 10 MG PO TABS: 10.0000 mg | ORAL_TABLET | Freq: Every day | ORAL | 0 refills | Status: DC

## 2019-10-02 MED ORDER — EZETIMIBE 10 MG PO TABS: 10.0000 mg | ORAL_TABLET | Freq: Every day | ORAL | 0 refills | Status: DC

## 2019-10-02 MED ORDER — ALLOPURINOL 100 MG PO TABS: 100.0000 mg | ORAL_TABLET | Freq: Every day | ORAL | 0 refills | Status: DC

## 2019-10-02 MED ORDER — LOSARTAN POTASSIUM 50 MG PO TABS: 50.0000 mg | ORAL_TABLET | Freq: Every day | ORAL | 0 refills | Status: DC

## 2019-10-02 MED FILL — LOSARTAN POTASSIUM 50 MG TA: 50 | 30 days supply | Qty: 30 | Fill #0

## 2019-10-02 MED FILL — AMLODIPINE BESYLATE 10 MG T: 10 | 30 days supply | Qty: 30 | Fill #0

## 2019-10-02 MED FILL — ALLOPURINOL 100 MG TABLET: 100 | 30 days supply | Qty: 30 | Fill #0

## 2019-10-02 NOTE — Telephone Encounter (Signed)
Patient called in and requested for listed medications to be refilled and sent to [facility] pharmacy. Patient stated that he told last medications today.  allopurinol (ZYLOPRIM) 100 MG tablet [[id number]]  amLODipine (NORVASC) 10 MG tablet [[id number]]  losartan (COZAAR) 50 MG tablet [[id number]]  metoprolol succinate (TOPROL-XL) 50 MG 24 hr tablet [[id number]]  ezetimibe (ZETIA) 10 MG tablet [[id number]]

## 2019-10-02 NOTE — Telephone Encounter (Signed)
Rx sent 

## 2019-10-03 MED FILL — METOPROLOL SUCCINATE ER 50: 50 | 30 days supply | Qty: 30 | Fill #0

## 2019-10-06 ENCOUNTER — Other Ambulatory Visit: Payer: Self-pay | Admitting: Family Medicine

## 2019-10-06 DIAGNOSIS — J449 Chronic obstructive pulmonary disease, unspecified: Secondary | ICD-10-CM

## 2019-10-08 MED FILL — SYMBICORT 160-4.5 MCG INH: 160-4.5 | 30 days supply | Qty: 10 | Fill #0

## 2019-10-09 DIAGNOSIS — J449 Chronic obstructive pulmonary disease, unspecified: Secondary | ICD-10-CM | POA: Diagnosis not present

## 2019-10-22 ENCOUNTER — Encounter: Payer: Self-pay | Admitting: Pulmonary Disease

## 2019-10-22 ENCOUNTER — Ambulatory Visit (INDEPENDENT_AMBULATORY_CARE_PROVIDER_SITE_OTHER): Payer: Medicaid Other | Admitting: Pulmonary Disease

## 2019-10-22 ENCOUNTER — Other Ambulatory Visit: Payer: Self-pay

## 2019-10-22 ENCOUNTER — Other Ambulatory Visit: Payer: Self-pay | Admitting: Pulmonary Disease

## 2019-10-22 DIAGNOSIS — J449 Chronic obstructive pulmonary disease, unspecified: Secondary | ICD-10-CM

## 2019-10-22 MED ORDER — SPIRIVA HANDIHALER 18 MCG IN CAPS: 18.0000 ug | ORAL_CAPSULE | Freq: Every day | RESPIRATORY_TRACT | 11 refills | Status: DC

## 2019-10-22 MED ORDER — BUDESONIDE-FORMOTEROL FUMARATE 160-4.5 MCG/ACT IN AERO: INHALATION_SPRAY | RESPIRATORY_TRACT | 11 refills | Status: DC

## 2019-10-22 MED ORDER — ALBUTEROL SULFATE HFA 108 (90 BASE) MCG/ACT IN AERS: 2.0000 | INHALATION_SPRAY | Freq: Four times a day (QID) | RESPIRATORY_TRACT | 11 refills | Status: DC | PRN

## 2019-10-22 MED ORDER — ALBUTEROL SULFATE (2.5 MG/3ML) 0.083% IN NEBU: 2.5000 mg | INHALATION_SOLUTION | Freq: Four times a day (QID) | RESPIRATORY_TRACT | 11 refills | Status: DC | PRN

## 2019-10-22 MED FILL — ALBUTEROL SUL 2.5 MG/3 ML S: (2.5 MG/3ML | 30 days supply | Qty: 360 | Fill #0

## 2019-10-22 NOTE — Patient Instructions (Signed)
We will renew your inhalers including Symbicort, Spiriva and albuterol Referral for low-dose screening CT of the chest  Follow-up in 1 year.

## 2019-10-22 NOTE — Progress Notes (Signed)
[name] [name]    [id number]    Oct 09, 1960  Primary Care Physician:Newlin, Charlane Ferretti, MD  Referring Physician: Charlott Rakes, MD [place],  [place] [address]  Virtual Visit via Telephone Note  I connected with [name] [name] on 10/22/19 at 10:30 AM EDT by telephone and verified that I am speaking with the correct person using two identifiers.  Location: Patient: Home Provider: [facility], [place], [place]   I discussed the limitations, risks, security and privacy concerns of performing an evaluation and management service by telephone and the availability of in person appointments. I also discussed with the patient that there may be a patient responsible charge related to this service. The patient expressed understanding and agreed to proceed.  Chief complaint:   Follow up for COPD GOLD B (Cat score 17, 0 exacerbations) Pulmonary HTN  HPI: [name] [name] is a 59 year old with extensive smoking history. His main complaint is dyspnea on exertion for the past several years. He has chronic cough with mucus production. Denies any wheezing, hemoptysis. He was maintained initially on just albuterol rescue inhaler. He is maintained on spiriva. He feels very currently. He has dyspnea on exertion which is not different from baseline. Denies any cough, sputum production, hemoptysis, fever, weight loss.  He has over 25 years pack year smoking history. Quit in 2020  Interim history: Continues on Symbicort and Spiriva.  No new complaints Has chronic dyspnea on exertion, cough with mucus production.  Outpatient Encounter Medications as of 04/17/2019  Medication Sig  . albuterol (PROVENTIL) (2.5 MG/3ML) 0.083% nebulizer solution INHALE 3 MLS (1 VIAL) EVERY 6 HOURS VIA NEBULIAZATION ROUTE AS NEEDED FOR SHORTNESS OF BREATH (Patient taking differently: Take 2.5 mg by nebulization every 6 (six) hours as needed for shortness of breath. )  . albuterol (VENTOLIN  HFA) 108 (90 Base) MCG/ACT inhaler INHALE 2 PUFFS INTO THE LUNGS EVERY 4 HOURS AS NEEDED FOR WHEEZING OR SHORTNESS OF BREATH.  Marland Kitchen allopurinol (ZYLOPRIM) 100 MG tablet Take 1 tablet (100 mg total) by mouth daily.  Marland Kitchen amLODipine (NORVASC) 10 MG tablet Take 1 tablet (10 mg total) by mouth daily.  Marland Kitchen ammonium lactate (LAC-HYDRIN) 12 % lotion Apply 1 application topically daily.  . AZO-CRANBERRY PO Take 2 tablets by mouth every 30 (thirty) days.  . budesonide-formoterol (SYMBICORT) 160-4.5 MCG/ACT inhaler INAHLE 2 PUFFS INTO THE LUNGS TWICE DAILY.  Marland Kitchen Cyanocobalamin (VITAMIN B-12) 1000 MCG SUBL Place 1 tablet (1,000 mcg total) under the tongue daily.  . cyclobenzaprine (FLEXERIL) 5 MG tablet Take 1 tablet (5 mg total) by mouth 2 (two) times daily as needed for muscle spasms.  . diphenhydrAMINE (BENADRYL ALLERGY) 25 MG tablet Take 50 mg by mouth daily as needed (allergies).   . ezetimibe (ZETIA) 10 MG tablet Take 1 tablet (10 mg total) by mouth daily.  Marland Kitchen gabapentin (NEURONTIN) 300 MG capsule Take 1 capsule (300 mg total) by mouth at bedtime.  . hydrocortisone 1 % ointment Apply 1 application topically 2 (two) times daily.  Marland Kitchen losartan (COZAAR) 50 MG tablet Take 1 tablet (50 mg total) by mouth daily. Needs PCP appointment.  . metoprolol succinate (TOPROL-XL) 50 MG 24 hr tablet Take 1 tablet (50 mg total) by mouth daily.  Marland Kitchen tiotropium (SPIRIVA HANDIHALER) 18 MCG inhalation capsule Place 1 capsule (18 mcg total) into inhaler and inhale daily.  Marland Kitchen triamcinolone ointment (KENALOG) 0.1 % APPLY 1 APPLICATION TOPICALLY 2 TIMES DAILY. (Patient taking differently: Apply 1  application topically 2 (two) times daily. )  . [DISCONTINUED] ALBUTEROL IN Inhale into the lungs.   No facility-administered encounter medications on file as of 04/17/2019.   Physical Exam: tele  Data Reviewed: Imaging CTA scan 12/13/13-no PE, mild airway thickening, atelectasis, cardiomegaly   CT chest 10/30/2018-emphysema in the upper lobes.   Mild lower lobe atelectasis I have reviewed the images personally.  PFTs. 07/22/15 2.19 [60%], FEV1 1.07 [37%],/49, DLCO 10.99 [38%] Severe obstruction with severe diffusion impairment  Cardiac Echo 10/27/14 Normal LV function; mild LVH and LVE; grade 1 diastolic dysfunction; moderate LAE; mild RAE; trace TR; severely elevated pulmonary pressure. PAP 72  Echo 07/21/2016 LVEF 44-03%, grade 1 diastolic dysfunction, PAP 42  Sleep study 06/15/15 No significant obstructive sleep apnea, moderate O2 desaturation  Labs Alpha-1 antitrypsin 06/16/2016-137, PI MM HIV 07/06/2016-negative SSB 7.9, double-stranded DNA, SSA, rheumatoid factor-negative Hepatic function panel 07/17/2017-negative  Assessment:  Severe COPD Continue Symbicort, Spiriva, rescue inhaler   Pulmonary hypertension. 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  Continue supplemental oxygen Repeat echo in 2018 shows improvement in PA pressures Work-up for alternative PAH etiologies including HIV, LFTs, ANA, RF, CCP is negative.  He has mild elevation in SSB which is likely nonspecific. We will continue to monitor  Heavy ex-smoker. Referral for low-dose screening CT of the chest  Health maintenance Prevnar 12/22/2013 Pneumovax 07/30/2012 He does not want to get the flu vaccine.  Plan/Recommendations: - Continue spiriva, symbicort - Continue supplemental O2 - Low dose screening CT chest.   I discussed the assessment and treatment plan with the patient. The patient was provided an opportunity to ask questions and all were answered. The patient agreed with the plan and demonstrated an understanding of the instructions.   The patient was advised to call back or seek an in-person evaluation if the symptoms worsen or if the condition fails to improve as anticipated.  I provided 25 minutes of non-face-to-face time during this encounter.  [name] MD [facility] 10/22/2019, 11:03 AM  CC: Charlott Rakes, MD

## 2019-10-22 NOTE — Addendum Note (Signed)
Addended by: Elton Sin on: 10/22/2019 12:09 PM   Modules accepted: Orders

## 2019-10-23 ENCOUNTER — Encounter: Payer: Self-pay | Admitting: Pulmonary Disease

## 2019-10-23 NOTE — Telephone Encounter (Signed)
Pt had televisit yesterday 10/22/19 with Dr. Vaughan Browner:  Instructions  We will renew your inhalers including Symbicort, Spiriva and albuterol Referral for low-dose screening CT of the chest     Called and spoke with pt to see if he knew who had tried to call him and he stated that he was not sure. After looking at pt's OV yesterday with Dr. Vaughan Browner, saw instructions mentioned about referral for low-dose screening CT.  Stated to pt that we would route this to Doroteo Glassman and he verbalized understanding.

## 2019-10-23 NOTE — Telephone Encounter (Signed)
Spoke with pt regarding new lung screening referral Dr Vaughan Browner has placed. Pt advised that Medicaid does not cover lung screening. I informed pt of the lung screening grant that we will be having this fall and pt is interested in participating. I instructed pt that I will call him closer to that time to schedule. Pt verbalized understanding. Will close this message and  Refer to referral notes.

## 2019-11-04 ENCOUNTER — Encounter: Payer: Self-pay | Admitting: Family Medicine

## 2019-11-04 ENCOUNTER — Ambulatory Visit: Payer: Medicaid Other | Attending: Family Medicine | Admitting: Family Medicine

## 2019-11-04 ENCOUNTER — Other Ambulatory Visit: Payer: Self-pay | Admitting: Family Medicine

## 2019-11-04 DIAGNOSIS — M1A9XX0 Chronic gout, unspecified, without tophus (tophi): Secondary | ICD-10-CM

## 2019-11-04 DIAGNOSIS — J449 Chronic obstructive pulmonary disease, unspecified: Secondary | ICD-10-CM | POA: Diagnosis not present

## 2019-11-04 DIAGNOSIS — J9611 Chronic respiratory failure with hypoxia: Secondary | ICD-10-CM

## 2019-11-04 DIAGNOSIS — I10 Essential (primary) hypertension: Secondary | ICD-10-CM

## 2019-11-04 DIAGNOSIS — Z1211 Encounter for screening for malignant neoplasm of colon: Secondary | ICD-10-CM | POA: Diagnosis not present

## 2019-11-04 DIAGNOSIS — G5623 Lesion of ulnar nerve, bilateral upper limbs: Secondary | ICD-10-CM | POA: Diagnosis not present

## 2019-11-04 MED ORDER — METOPROLOL SUCCINATE ER 50 MG PO TB24: 50.0000 mg | ORAL_TABLET | Freq: Every day | ORAL | 1 refills | Status: DC

## 2019-11-04 MED ORDER — GABAPENTIN 300 MG PO CAPS: 300.0000 mg | ORAL_CAPSULE | Freq: Every day | ORAL | 1 refills | Status: DC

## 2019-11-04 MED ORDER — EZETIMIBE 10 MG PO TABS: 10.0000 mg | ORAL_TABLET | Freq: Every day | ORAL | 1 refills | Status: DC

## 2019-11-04 MED ORDER — AMLODIPINE BESYLATE 10 MG PO TABS: 10.0000 mg | ORAL_TABLET | Freq: Every day | ORAL | 1 refills | Status: DC

## 2019-11-04 MED ORDER — ALLOPURINOL 100 MG PO TABS: 100.0000 mg | ORAL_TABLET | Freq: Every day | ORAL | 1 refills | Status: DC

## 2019-11-04 MED ORDER — LOSARTAN POTASSIUM 50 MG PO TABS: 50.0000 mg | ORAL_TABLET | Freq: Every day | ORAL | 1 refills | Status: DC

## 2019-11-04 MED FILL — AMLODIPINE BESYLATE 10 MG T: 10 | 90 days supply | Qty: 90 | Fill #0

## 2019-11-04 MED FILL — METOPROLOL SUCCINATE ER 50: 50 | 90 days supply | Qty: 90 | Fill #0

## 2019-11-04 MED FILL — EZETIMIBE 10 MG TABS: 10 | 90 days supply | Qty: 90 | Fill #0

## 2019-11-04 MED FILL — ALLOPURINOL 100 MG TABLET: 100 | 90 days supply | Qty: 90 | Fill #0

## 2019-11-04 MED FILL — GABAPENTIN 300 MG CAPSULE: 300 | 90 days supply | Qty: 90 | Fill #0

## 2019-11-04 MED FILL — LOSARTAN POTASSIUM 50 MG TA: 50 | 90 days supply | Qty: 90 | Fill #0

## 2019-11-04 NOTE — Progress Notes (Signed)
Need refill on all medication except Nebulizer medication.

## 2019-11-04 NOTE — Progress Notes (Signed)
Virtual Visit via Telephone Note  I connected with [name] [name], on 11/04/2019 at 12:00 PM by telephone due to the COVID-19 pandemic and verified that I am speaking with the correct person using two identifiers.   Consent: I discussed the limitations, risks, security and privacy concerns of performing an evaluation and management service by telephone and the availability of in person appointments. I also discussed with the patient that there may be a patient responsible charge related to this service. The patient expressed understanding and agreed to proceed.   Location of Patient: Home  Location of Provider: [facility]   Persons participating in Telemedicine visit: Ezekeil Bethel Farrington-CMA Dr. 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 Seen by pulmonary 2 weeks ago for management of his COPD and he reports doing well, his medications were refilled. He is requesting refill of his antihypertensive and his gout medication.  He did have a recent gout flare but endorses eating some hamburgers which triggered his flareup. He has no additional concerns today.   Past Medical History:  Diagnosis Date  . Asthma 1999  . 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 COPD (chronic obstructive pulmonary disease) ([place]) 12/15/2013  . Essential hypertension   . Gout   . Influenza with respiratory manifestations 07/30/2012  . Pneumonia   . Thrombocytopenia ([place]) 07/28/2012  . Tobacco abuse 07/29/2012   Allergies  Allergen Reactions  . Atorvastatin Itching and Swelling    Facial, tongue swelling  . Shellfish Allergy Anaphylaxis  . Colchicine Hives    Current Outpatient Medications on File Prior to Visit  Medication Sig Dispense Refill  . albuterol (PROVENTIL) (2.5 MG/3ML)  0.083% nebulizer solution Take 3 mLs (2.5 mg total) by nebulization every 6 (six) hours as needed for shortness of breath. 360 mL 11  . albuterol (VENTOLIN HFA) 108 (90 Base) MCG/ACT inhaler Inhale 2 puffs into the lungs every 6 (six) hours as needed for wheezing or shortness of breath. 8.5 g 11  . ammonium lactate (LAC-HYDRIN) 12 % lotion Apply 1 application topically daily.    . AZO-CRANBERRY PO Take 2 tablets by mouth every 30 (thirty) days.    . budesonide-formoterol (SYMBICORT) 160-4.5 MCG/ACT inhaler INAHLE 2 PUFFS INTO THE LUNGS TWICE DAILY. 10.2 g 11  . Cyanocobalamin (VITAMIN B-12) 1000 MCG SUBL Place 1 tablet (1,000 mcg total) under the tongue daily. 30 tablet 11  . cyclobenzaprine (FLEXERIL) 5 MG tablet Take 1 tablet (5 mg total) by mouth 2 (two) times daily as needed for muscle spasms. 20 tablet 0  . diphenhydrAMINE (BENADRYL ALLERGY) 25 MG tablet Take 50 mg by mouth daily as needed (allergies).     . hydrocortisone 1 % ointment Apply 1 application topically 2 (two) times daily. 30 g 0  . tiotropium (SPIRIVA HANDIHALER) 18 MCG inhalation capsule Place 1 capsule (18 mcg total) into inhaler and inhale daily. 30 capsule 11  . triamcinolone ointment (KENALOG) 0.1 % APPLY 1 APPLICATION TOPICALLY 2 TIMES DAILY. (Patient taking differently: Apply 1 application topically 2 (two) times daily. ) 80 g 0  . [DISCONTINUED] ALBUTEROL IN Inhale into the lungs.     No current facility-administered medications on file prior to visit.    Observations/Objective: Awake, alert, oriented x3 Not in acute distress  Assessment and Plan: 1. Essential hypertension  Controlled Counseled on blood pressure goal of less than 130/80, low-sodium, DASH diet, medication compliance, 150 minutes of moderate intensity exercise per week. Discussed medication compliance, adverse effects. - metoprolol succinate (TOPROL-XL) 50 MG 24 hr tablet; Take 1 tablet (50 mg total) by mouth daily.  Dispense: 90 tablet; Refill: 1 -  losartan (COZAAR) 50 MG tablet; Take 1 tablet (50 mg total) by mouth daily.  Dispense: 90 tablet; Refill: 1 - amLODipine (NORVASC) 10 MG tablet; Take 1 tablet (10 mg total) by mouth daily.  Dispense: 90 tablet; Refill: 1 - LP+Non-HDL Cholesterol; Future - CMP14+EGFR; Future  2. Ulnar neuropathy of both upper extremities Stable - gabapentin (NEURONTIN) 300 MG capsule; Take 1 capsule (300 mg total) by mouth at bedtime.  Dispense: 90 capsule; Refill: 1  3. Chronic gout without tophus, unspecified cause, unspecified site Recent flare triggered by ingestion of beef Advised to avoid foods that trigger gout - allopurinol (ZYLOPRIM) 100 MG tablet; Take 1 tablet (100 mg total) by mouth daily.  Dispense: 90 tablet; Refill: 1  4. Screening for colon cancer He declines colonoscopy referral We will refer for Cologuard - Cologuard; Future  5. COPD without exacerbation ([place]) No recent exacerbation Continue Symbicort, Spiriva, Proventil MDI as needed  6. Chronic respiratory failure with hypoxia (HCC) Currently on 4 L of oxygen chronically   Follow Up Instructions: 6 months   I discussed the assessment and treatment plan with the patient. The patient was provided an opportunity to ask questions and all were answered. The patient agreed with the plan and demonstrated an understanding of the instructions.   The patient was advised to call back or seek an in-person evaluation if the symptoms worsen or if the condition fails to improve as anticipated.     I provided 12 minutes total of non-face-to-face time during this encounter including median intraservice time, reviewing previous notes, investigations, ordering medications, medical decision making, coordinating care and patient verbalized understanding at the end of the visit.     Charlott Rakes, MD, FAAFP. [facility] [place], [place]   11/04/2019, 12:00 PM

## 2019-11-07 MED FILL — PROAIR HFA 90 MCG INHALER: 108 (90 BAS | 25 days supply | Qty: 9 | Fill #0

## 2019-11-07 MED FILL — SPIRIVA 18 MCG CP-HANDIHALE: 18 | 30 days supply | Qty: 30 | Fill #0

## 2019-11-07 MED FILL — SYMBICORT 160-4.5 MCG INH: 160-4.5 | 30 days supply | Qty: 10 | Fill #0

## 2019-11-09 DIAGNOSIS — J449 Chronic obstructive pulmonary disease, unspecified: Secondary | ICD-10-CM | POA: Diagnosis not present

## 2019-12-08 ENCOUNTER — Telehealth (INDEPENDENT_AMBULATORY_CARE_PROVIDER_SITE_OTHER): Payer: Medicaid Other | Admitting: Family Medicine

## 2019-12-08 MED FILL — PROAIR HFA 90 MCG INHALER: 108 (90 BAS | 25 days supply | Qty: 9 | Fill #1

## 2019-12-08 MED FILL — SPIRIVA 18 MCG CP-HANDIHALE: 18 | 30 days supply | Qty: 30 | Fill #1

## 2019-12-08 MED FILL — SYMBICORT 160-4.5 MCG INH: 160-4.5 | 30 days supply | Qty: 10 | Fill #1

## 2019-12-10 DIAGNOSIS — J449 Chronic obstructive pulmonary disease, unspecified: Secondary | ICD-10-CM | POA: Diagnosis not present

## 2020-01-06 MED FILL — SPIRIVA 18 MCG CP-HANDIHALE: 18 | 30 days supply | Qty: 30 | Fill #2

## 2020-01-06 MED FILL — PROAIR HFA 90 MCG INHALER: 108 (90 BAS | 25 days supply | Qty: 9 | Fill #2

## 2020-01-06 MED FILL — ALBUTEROL SUL 2.5 MG/3 ML S: (2.5 MG/3ML | 30 days supply | Qty: 360 | Fill #0

## 2020-01-06 MED FILL — SYMBICORT 160-4.5 MCG INH: 160-4.5 | 30 days supply | Qty: 10 | Fill #2

## 2020-01-09 DIAGNOSIS — J449 Chronic obstructive pulmonary disease, unspecified: Secondary | ICD-10-CM | POA: Diagnosis not present

## 2020-01-26 ENCOUNTER — Encounter: Payer: Self-pay | Admitting: Acute Care

## 2020-01-26 ENCOUNTER — Ambulatory Visit: Payer: Self-pay | Admitting: Acute Care

## 2020-01-26 ENCOUNTER — Other Ambulatory Visit: Payer: Self-pay

## 2020-02-05 MED FILL — SPIRIVA 18 MCG CP-HANDIHALE: 18 | 30 days supply | Qty: 30 | Fill #3

## 2020-02-05 MED FILL — METOPROLOL SUCCINATE ER 50: 50 | 90 days supply | Qty: 90 | Fill #1

## 2020-02-05 MED FILL — ALLOPURINOL 100 MG TABLET: 100 | 90 days supply | Qty: 90 | Fill #1

## 2020-02-05 MED FILL — ALBUTEROL SUL 2.5 MG/3 ML S: (2.5 MG/3ML | 30 days supply | Qty: 360 | Fill #1

## 2020-02-05 MED FILL — SYMBICORT 160-4.5 MCG INH: 160-4.5 | 30 days supply | Qty: 10 | Fill #3

## 2020-02-05 MED FILL — AMLODIPINE BESYLATE 10 MG T: 10 | 90 days supply | Qty: 90 | Fill #1

## 2020-02-05 MED FILL — LOSARTAN POTASSIUM 50 MG TA: 50 | 90 days supply | Qty: 90 | Fill #1

## 2020-02-05 MED FILL — EZETIMIBE 10 MG TABS: 10 | 90 days supply | Qty: 90 | Fill #1

## 2020-02-09 DIAGNOSIS — J449 Chronic obstructive pulmonary disease, unspecified: Secondary | ICD-10-CM | POA: Diagnosis not present

## 2020-02-09 MED FILL — PROAIR HFA 90 MCG INHALER: 108 (90 BAS | 25 days supply | Qty: 9 | Fill #3

## 2020-03-09 MED FILL — SYMBICORT 160-4.5 MCG INH: 160-4.5 | 30 days supply | Qty: 10 | Fill #4

## 2020-03-09 MED FILL — SPIRIVA 18 MCG CP-HANDIHALE: 18 | 30 days supply | Qty: 30 | Fill #4

## 2020-03-09 MED FILL — PROAIR HFA 90 MCG INHALER: 108 (90 BAS | 25 days supply | Qty: 9 | Fill #4

## 2020-03-09 MED FILL — GABAPENTIN 300 MG CAPSULE: 300 | 90 days supply | Qty: 90 | Fill #1

## 2020-03-10 DIAGNOSIS — J449 Chronic obstructive pulmonary disease, unspecified: Secondary | ICD-10-CM | POA: Diagnosis not present

## 2020-04-08 MED FILL — PROAIR HFA 90 MCG INHALER: 108 (90 BAS | 25 days supply | Qty: 9 | Fill #5

## 2020-04-08 MED FILL — SYMBICORT 160-4.5 MCG INH: 160-4.5 | 30 days supply | Qty: 10 | Fill #5

## 2020-04-08 MED FILL — SPIRIVA 18 MCG CP-HANDIHALE: 18 | 30 days supply | Qty: 30 | Fill #5

## 2020-04-10 DIAGNOSIS — J449 Chronic obstructive pulmonary disease, unspecified: Secondary | ICD-10-CM | POA: Diagnosis not present

## 2020-04-22 ENCOUNTER — Telehealth: Payer: Self-pay | Admitting: Family Medicine

## 2020-04-22 NOTE — Telephone Encounter (Signed)
Copied from [place] [id number]. 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 Please advise.

## 2020-04-26 NOTE — Telephone Encounter (Signed)
Will route to PCP for review. 

## 2020-04-26 NOTE — Telephone Encounter (Signed)
Pt will be informed once back in office.

## 2020-04-26 NOTE — Telephone Encounter (Signed)
Done

## 2020-04-28 NOTE — Telephone Encounter (Signed)
Pt was called and informed that letter is ready. Pt requested that letter be mailed.

## 2020-05-10 ENCOUNTER — Other Ambulatory Visit: Payer: Self-pay | Admitting: Family Medicine

## 2020-05-10 DIAGNOSIS — M1A9XX0 Chronic gout, unspecified, without tophus (tophi): Secondary | ICD-10-CM

## 2020-05-10 DIAGNOSIS — I10 Essential (primary) hypertension: Secondary | ICD-10-CM

## 2020-05-10 MED FILL — METOPROLOL SUCCINATE ER 50: 50 | 30 days supply | Qty: 30 | Fill #0

## 2020-05-10 MED FILL — ALLOPURINOL 100 MG TABLET: 100 | 90 days supply | Qty: 90 | Fill #0

## 2020-05-10 MED FILL — SYMBICORT 160-4.5 MCG INH: 160-4.5 | 30 days supply | Qty: 10 | Fill #6

## 2020-05-10 MED FILL — EZETIMIBE 10 MG TABS: 10 | 30 days supply | Qty: 30 | Fill #0

## 2020-05-10 MED FILL — SPIRIVA 18 MCG CP-HANDIHALE: 18 | 30 days supply | Qty: 30 | Fill #6

## 2020-05-10 MED FILL — AMLODIPINE BESYLATE 10 MG T: 10 | 30 days supply | Qty: 30 | Fill #0

## 2020-05-10 MED FILL — LOSARTAN POTASSIUM 50 MG TA: 50 | 30 days supply | Qty: 30 | Fill #0

## 2020-05-10 MED FILL — PROAIR HFA 90 MCG INHALER: 108 (90 BAS | 25 days supply | Qty: 9 | Fill #6

## 2020-05-10 NOTE — Telephone Encounter (Signed)
Patient called and advised it's due for him to come into the office for a visit as noted last telemedicine visit to return in office in 6 months. Patient asked why does he need to come into the office because he's on oxygen and has not been going out due to [place]. I advised that no blood work since 08/2018 and he will need to come into the office for a physical and blood work in order to continue receiving medication refills. He says this is against his better judgement, but he will come in on a Monday or Tuesday. Appointment for CPE scheduled for Tuesday, 06/08/20 at 1010 with Dr. Margarita Rana. Patient advised refills will be sent to cover until his appointment, he verbalized understanding.

## 2020-05-11 DIAGNOSIS — J449 Chronic obstructive pulmonary disease, unspecified: Secondary | ICD-10-CM | POA: Diagnosis not present

## 2020-05-27 ENCOUNTER — Ambulatory Visit: Payer: Medicaid Other | Admitting: Acute Care

## 2020-05-27 ENCOUNTER — Telehealth: Payer: Self-pay | Admitting: Acute Care

## 2020-05-27 NOTE — Telephone Encounter (Signed)
I have called the patient x 4 , twice on home phone and twice on cell. I left a message on the home number, as there was an option to do so. The mobile ( cell) number rang busy each time. No option to leave a message. This is the second time the patient has been a no show for his tele visit for grant lung cancer screening. We will move down our list of others who are in  need this scan.

## 2020-06-07 MED FILL — SPIRIVA 18 MCG CP-HANDIHALE: 18 | 30 days supply | Qty: 30 | Fill #7

## 2020-06-07 MED FILL — PROAIR HFA 90 MCG INHALER: 108 (90 BAS | 25 days supply | Qty: 9 | Fill #7

## 2020-06-07 MED FILL — SYMBICORT 160-4.5 MCG INH: 160-4.5 | 30 days supply | Qty: 10 | Fill #7

## 2020-06-08 ENCOUNTER — Other Ambulatory Visit: Payer: Self-pay

## 2020-06-08 ENCOUNTER — Other Ambulatory Visit: Payer: Self-pay | Admitting: Family Medicine

## 2020-06-08 ENCOUNTER — Ambulatory Visit: Payer: Medicaid Other | Attending: Family Medicine | Admitting: Family Medicine

## 2020-06-08 ENCOUNTER — Encounter: Payer: Self-pay | Admitting: Family Medicine

## 2020-06-08 VITALS — BP 134/80 | HR 83 | Ht 67.0 in | Wt 239.0 lb

## 2020-06-08 DIAGNOSIS — Z125 Encounter for screening for malignant neoplasm of prostate: Secondary | ICD-10-CM | POA: Diagnosis not present

## 2020-06-08 DIAGNOSIS — Z0000 Encounter for general adult medical examination without abnormal findings: Secondary | ICD-10-CM

## 2020-06-08 DIAGNOSIS — I10 Essential (primary) hypertension: Secondary | ICD-10-CM | POA: Diagnosis not present

## 2020-06-08 DIAGNOSIS — Z2821 Immunization not carried out because of patient refusal: Secondary | ICD-10-CM

## 2020-06-08 DIAGNOSIS — Z0001 Encounter for general adult medical examination with abnormal findings: Secondary | ICD-10-CM

## 2020-06-08 DIAGNOSIS — J449 Chronic obstructive pulmonary disease, unspecified: Secondary | ICD-10-CM | POA: Diagnosis not present

## 2020-06-08 DIAGNOSIS — J9611 Chronic respiratory failure with hypoxia: Secondary | ICD-10-CM | POA: Diagnosis not present

## 2020-06-08 MED ORDER — LOSARTAN POTASSIUM 50 MG PO TABS
50.0000 mg | ORAL_TABLET | Freq: Every day | ORAL | 1 refills | Status: DC
Start: 2020-06-08 — End: 2020-06-08

## 2020-06-08 MED ORDER — AMLODIPINE BESYLATE 10 MG PO TABS: 10.0000 mg | ORAL_TABLET | Freq: Every day | ORAL | 1 refills | Status: DC

## 2020-06-08 MED ORDER — METOPROLOL SUCCINATE ER 50 MG PO TB24: 50.0000 mg | ORAL_TABLET | Freq: Every day | ORAL | 1 refills | Status: DC

## 2020-06-08 MED FILL — METOPROLOL SUCCINATE ER 50: 50 | 90 days supply | Qty: 90 | Fill #0

## 2020-06-08 MED FILL — LOSARTAN POTASSIUM 50 MG TA: 50 | 90 days supply | Qty: 90 | Fill #0

## 2020-06-08 MED FILL — AMLODIPINE BESYLATE 10 MG T: 10 | 90 days supply | Qty: 90 | Fill #0

## 2020-06-08 NOTE — Progress Notes (Signed)
Subjective:  Patient ID: [name] [name], male    DOB: 07-17-1960  Age: 60 y.o. MRN: [id number]  CC: Annual Exam   HPI [name] [name] is a60 year old male with a history of end-stage COPD (on 4 L of oxygen at rest and 8 L with ambulation), gout, ulnar neuropathy, hypertension,Psoriasis whopresents today for an annual physical exam. He is due for colonoscopy but is not keen on having a colonoscopy he would rather have a stool test.  Denies family history of prostate or colon cancer. He is a former smoker.  Compliant with his antihypertensive and denies adverse effects from his medication.  His COPD is stable and he is doing well on his inhalers and follows up with his pulmonologist.  He has had no recent COPD exacerbations.  Remains on oxygen for his chronic respiratory failure. He has no chest pain and has no worsening dyspnea from his baseline. Past Medical History:  Diagnosis Date  . Asthma 1999  . 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 COPD (chronic obstructive pulmonary disease) ([place]) 12/15/2013  . Essential hypertension   . Gout   . Influenza with respiratory manifestations 07/30/2012  . Pneumonia   . Thrombocytopenia ([place]) 07/28/2012  . Tobacco abuse 07/29/2012    Past Surgical History:  Procedure Laterality Date  . FRACTURE SURGERY  childhood   right arm  . KNEE SURGERY     right knee s/p trauma    Family History  Problem Relation Age of Onset  . Cancer Mother        colon   . Heart disease Father   . Colon cancer Other        mother ? age of diagnosis   . Healthy Son   . Healthy Daughter     Allergies  Allergen Reactions  . Atorvastatin Itching and Swelling    Facial, tongue swelling  . Shellfish Allergy Anaphylaxis  . Colchicine Hives    Outpatient Medications Prior to Visit  Medication Sig Dispense Refill  . albuterol (PROVENTIL) (2.5 MG/3ML) 0.083% nebulizer solution Take 3 mLs (2.5 mg total) by nebulization every 6 (six) hours as needed for  shortness of breath. 360 mL 11  . albuterol (VENTOLIN HFA) 108 (90 Base) MCG/ACT inhaler Inhale 2 puffs into the lungs every 6 (six) hours as needed for wheezing or shortness of breath. 8.5 g 11  . allopurinol (ZYLOPRIM) 100 MG tablet TAKE 1 TABLET (100 MG TOTAL) BY MOUTH DAILY. 90 tablet 1  . ammonium lactate (LAC-HYDRIN) 12 % lotion Apply 1 application topically daily.    . AZO-CRANBERRY PO Take 2 tablets by mouth every 30 (thirty) days.    . budesonide-formoterol (SYMBICORT) 160-4.5 MCG/ACT inhaler INAHLE 2 PUFFS INTO THE LUNGS TWICE DAILY. 10.2 g 11  . Cyanocobalamin (VITAMIN B-12) 1000 MCG SUBL Place 1 tablet (1,000 mcg total) under the tongue daily. 30 tablet 11  . diphenhydrAMINE (BENADRYL) 25 MG tablet Take 50 mg by mouth daily as needed (allergies).     . ezetimibe (ZETIA) 10 MG tablet Take 1 tablet (10 mg total) by mouth daily. 90 tablet 1  . gabapentin (NEURONTIN) 300 MG capsule Take 1 capsule (300 mg total) by mouth at bedtime. 90 capsule 1  . hydrocortisone 1 % ointment Apply 1 application topically 2 (two) times daily. 30 g 0  . tiotropium (SPIRIVA HANDIHALER) 18 MCG inhalation capsule Place 1 capsule (18 mcg total) into inhaler and inhale daily. 30 capsule 11  . triamcinolone ointment (KENALOG) 0.1 % APPLY  1 APPLICATION TOPICALLY 2 TIMES DAILY. (Patient taking differently: Apply 1 application topically 2 (two) times daily.) 80 g 0  . amLODipine (NORVASC) 10 MG tablet Take 1 tablet (10 mg total) by mouth daily. OFFICE VISIT NEEDED FOR ADDITIONAL REFILLS 30 tablet 0  . losartan (COZAAR) 50 MG tablet Take 1 tablet (50 mg total) by mouth daily. OFFICE VISIT NEEDED FOR ADDITIONAL REFILLS 30 tablet 0  . metoprolol succinate (TOPROL-XL) 50 MG 24 hr tablet Take 1 tablet (50 mg total) by mouth daily. OFFICE VISIT NEEDED FOR ADDITIONAL REFILLS 30 tablet 0  . cyclobenzaprine (FLEXERIL) 5 MG tablet Take 1 tablet (5 mg total) by mouth 2 (two) times daily as needed for muscle spasms. (Patient not  taking: Reported on 06/08/2020) 20 tablet 0   No facility-administered medications prior to visit.     ROS Review of Systems  Constitutional: Negative for activity change and appetite change.  HENT: Negative for sinus pressure and sore throat.   Eyes: Negative for visual disturbance.  Respiratory: Positive for shortness of breath (at baseline for which he is on oxygen). Negative for cough and chest tightness.   Cardiovascular: Negative for chest pain and leg swelling.  Gastrointestinal: Negative for abdominal distention, abdominal pain, constipation and diarrhea.  Endocrine: Negative.   Genitourinary: Negative for dysuria.  Musculoskeletal: Negative for joint swelling and myalgias.  Skin: Negative for rash.  Allergic/Immunologic: Negative.   Neurological: Negative for weakness, light-headedness and numbness.  Psychiatric/Behavioral: Negative for dysphoric mood and suicidal ideas.    Objective:  BP 134/80   Pulse 83   Ht _0  (1.702 m)   Wt 239 lb (108.4 kg)   SpO2 98%   BMI 37.43 kg/m   BP/Weight 06/08/2020 04/17/2019 05/11/69  Systolic BP 219 758 832  Diastolic BP 80 80 85  Wt. (Lbs) 239 235 234  BMI 37.43 36.81 36.65      Physical Exam Constitutional:      Appearance: He is well-developed.     Comments: On oxygen via intranasal cannula at rest  HENT:     Mouth/Throat:     Mouth: Mucous membranes are moist.  Neck:     Vascular: No JVD.  Cardiovascular:     Rate and Rhythm: Normal rate.     Heart sounds: Normal heart sounds. No murmur heard.   Pulmonary:     Effort: Pulmonary effort is normal.     Breath sounds: Normal breath sounds. No wheezing or rales.  Chest:     Chest wall: No tenderness.  Abdominal:     General: Bowel sounds are normal. There is no distension.     Palpations: Abdomen is soft. There is no mass.     Tenderness: There is no abdominal tenderness.  Musculoskeletal:        General: Normal range of motion.     Right lower leg: No edema.      Left lower leg: No edema.  Skin:    General: Skin is warm.  Neurological:     Mental Status: He is alert and oriented to person, place, and time.  Psychiatric:        Mood and Affect: Mood normal.      CMP Latest Ref Rng & Units 10/30/2018 08/23/2018 07/17/2017  Glucose 70 - 99 mg/dL 90 88 80  BUN 6 - 20 mg/dL _1 Creatinine 0.61 - 1.24 mg/dL 1.15 1.48(H) 1.28(H)  Sodium 135 - 145 mmol/L 141 139 142  Potassium 3.5 - 5.1 mmol/L  4.4 4.6 4.3  Chloride 98 - 111 mmol/L 101 99 98  CO2 22 - 32 mmol/L 32 26 30(H)  Calcium 8.9 - 10.3 mg/dL 9.5 9.6 9.5  Total Protein 6.5 - 8.1 g/dL 7.7 7.5 7.4  Total Bilirubin 0.3 - 1.2 mg/dL 0.9 0.4 0.3  Alkaline Phos 38 - 126 U/L 88 102 99  AST 15 - 41 U/L _0 ALT 0 - 44 U/L _1 Lipid Panel     Component Value Date/Time   CHOL 210 (H) 08/23/2018 0924   TRIG 102 08/23/2018 0924   HDL 43 08/23/2018 0924   CHOLHDL 4.9 08/23/2018 0924   CHOLHDL 4.1 10/26/2014 0429   VLDL 10 10/26/2014 0429   LDLCALC 147 (H) 08/23/2018 0924    CBC    Component Value Date/Time   WBC 10.8 (H) 10/30/2018 0703   RBC 5.74 10/30/2018 0703   HGB 13.7 10/30/2018 0703   HGB 13.4 08/23/2018 0924   HCT 47.1 10/30/2018 0703   HCT 42.8 08/23/2018 0924   PLT 253 10/30/2018 0703   PLT 284 08/23/2018 0924   MCV 82.1 10/30/2018 0703   MCV 77 (L) 08/23/2018 0924   MCH 23.9 (L) 10/30/2018 0703   MCHC 29.1 (L) 10/30/2018 0703   RDW 13.2 10/30/2018 0703   RDW 12.7 08/23/2018 0924   LYMPHSABS 2.7 10/30/2018 0703   LYMPHSABS 2.7 08/23/2018 0924   MONOABS 1.2 (H) 10/30/2018 0703   EOSABS 0.4 10/30/2018 0703   EOSABS 0.2 08/23/2018 0924   BASOSABS 0.1 10/30/2018 0703   BASOSABS 0.1 08/23/2018 0924    Lab Results  Component Value Date   HGBA1C 5.2 10/26/2014    Assessment & Plan:  1. Annual physical exam Counseled on 150 minutes of exercise per week, healthy eating (including decreased daily intake of saturated fats, cholesterol, added sugars,  sodium), routine healthcare maintenance.  - CBC with Differential/Platelet  2. Influenza vaccine refused Discussed with patient benefits of receiving the vaccine however he declined  3. Essential hypertension Controlled Continue current regimen Counseled on blood pressure goal of less than 130/80, low-sodium, DASH diet, medication compliance, 150 minutes of moderate intensity exercise per week. Discussed medication compliance, adverse effects. - amLODipine (NORVASC) 10 MG tablet; Take 1 tablet (10 mg total) by mouth daily.  Dispense: 90 tablet; Refill: 1 - losartan (COZAAR) 50 MG tablet; Take 1 tablet (50 mg total) by mouth daily.  Dispense: 90 tablet; Refill: 1 - metoprolol succinate (TOPROL-XL) 50 MG 24 hr tablet; Take 1 tablet (50 mg total) by mouth daily.  Dispense: 90 tablet; Refill: 1 - CMP14+EGFR - Lipid panel  4. Chronic respiratory failure with hypoxia (HCC) Secondary to end-stage COPD Currently on oxygen at rest  5. Screening for prostate cancer - PSA, total and free    Meds ordered this encounter  Medications  . amLODipine (NORVASC) 10 MG tablet    Sig: Take 1 tablet (10 mg total) by mouth daily.    Dispense:  90 tablet    Refill:  1  . losartan (COZAAR) 50 MG tablet    Sig: Take 1 tablet (50 mg total) by mouth daily.    Dispense:  90 tablet    Refill:  1  . metoprolol succinate (TOPROL-XL) 50 MG 24 hr tablet    Sig: Take 1 tablet (50 mg total) by mouth daily.    Dispense:  90 tablet    Refill:  1    Follow-up: Return in about 6 months (  around 12/09/2020) for Chronic medical conditions.       Charlott Rakes, MD, FAAFP. [facility] and [place], [place]   06/08/2020, 1:10 PM

## 2020-06-08 NOTE — Patient Instructions (Signed)

## 2020-06-09 ENCOUNTER — Other Ambulatory Visit: Payer: Self-pay | Admitting: Family Medicine

## 2020-06-09 LAB — CMP14+EGFR
ALT: 21 IU/L (ref 0–44)
AST: 17 IU/L (ref 0–40)
Albumin/Globulin Ratio: 1.1 — ABNORMAL LOW (ref 1.2–2.2)
Albumin: 3.9 g/dL (ref 3.8–4.8)
Alkaline Phosphatase: 86 IU/L (ref 44–121)
BUN/Creatinine Ratio: 9 — ABNORMAL LOW (ref 10–24)
BUN: 9 mg/dL (ref 8–27)
Bilirubin Total: 0.6 mg/dL (ref 0.0–1.2)
CO2: 24 mmol/L (ref 20–29)
Calcium: 9.5 mg/dL (ref 8.6–10.2)
Chloride: 102 mmol/L (ref 96–106)
Creatinine, Ser: 1.05 mg/dL (ref 0.76–1.27)
Globulin, Total: 3.4 g/dL (ref 1.5–4.5)
Glucose: 90 mg/dL (ref 65–99)
Potassium: 4.5 mmol/L (ref 3.5–5.2)
Sodium: 142 mmol/L (ref 134–144)
Total Protein: 7.3 g/dL (ref 6.0–8.5)
eGFR: 81 mL/min/{1.73_m2} (ref >59)

## 2020-06-09 LAB — CBC WITH DIFFERENTIAL/PLATELET
Basophils Absolute: 0 10*3/uL (ref 0.0–0.2)
Basos: 0 %
EOS (ABSOLUTE): 0.2 10*3/uL (ref 0.0–0.4)
Eos: 2 %
Hematocrit: 46.6 % (ref 37.5–51.0)
Hemoglobin: 14.7 g/dL (ref 13.0–17.7)
Immature Grans (Abs): 0.1 10*3/uL (ref 0.0–0.1)
Immature Granulocytes: 1 %
Lymphocytes Absolute: 2.4 10*3/uL (ref 0.7–3.1)
Lymphs: 22 %
MCH: 24.2 pg — ABNORMAL LOW (ref 26.6–33.0)
MCHC: 31.5 g/dL (ref 31.5–35.7)
MCV: 77 fL — ABNORMAL LOW (ref 79–97)
Monocytes Absolute: 1 10*3/uL — ABNORMAL HIGH (ref 0.1–0.9)
Monocytes: 9 %
Neutrophils Absolute: 7.1 10*3/uL — ABNORMAL HIGH (ref 1.4–7.0)
Neutrophils: 66 %
Platelets: 249 10*3/uL (ref 150–450)
RBC: 6.07 x10E6/uL — ABNORMAL HIGH (ref 4.14–5.80)
RDW: 12.3 % (ref 11.6–15.4)
WBC: 10.7 10*3/uL (ref 3.4–10.8)

## 2020-06-09 LAB — LIPID PANEL
Chol/HDL Ratio: 4.6 ratio (ref 0.0–5.0)
Cholesterol, Total: 222 mg/dL — ABNORMAL HIGH (ref 100–199)
HDL: 48 mg/dL (ref >39)
LDL Chol Calc (NIH): 161 mg/dL — ABNORMAL HIGH (ref 0–99)
Triglycerides: 76 mg/dL (ref 0–149)
VLDL Cholesterol Cal: 13 mg/dL (ref 5–40)

## 2020-06-09 LAB — PSA, TOTAL AND FREE
PSA, Free Pct: 28.8 %
PSA, Free: 0.23 ng/mL
Prostate Specific Ag, Serum: 0.8 ng/mL (ref 0.0–4.0)

## 2020-06-09 MED ORDER — EZETIMIBE 10 MG PO TABS: 10.0000 mg | ORAL_TABLET | Freq: Every day | ORAL | 1 refills | Status: DC

## 2020-06-09 MED FILL — EZETIMIBE 10 MG TABS: 10 | 90 days supply | Qty: 90 | Fill #0

## 2020-06-10 ENCOUNTER — Telehealth: Payer: Self-pay

## 2020-06-10 NOTE — Telephone Encounter (Signed)
-----   Message from Charlott Rakes, MD sent at 06/09/2020  9:10 AM EST ----- Prostate test is normal, kidney and liver functions are normal.  Cholesterol is slightly elevated compared to last year.  Due to his inability to tolerate statins he will need to continue with Zetia, fish oil capsule be beneficial and a low-cholesterol diet.

## 2020-06-10 NOTE — Telephone Encounter (Signed)
Patient name and DOB has been verified Patient was informed of lab results. Patient had no questions.  

## 2020-07-05 MED FILL — SYMBICORT 160-4.5 MCG INH: 160-4.5 | 30 days supply | Qty: 10 | Fill #8

## 2020-07-05 MED FILL — SPIRIVA 18 MCG CP-HANDIHALE: 18 | 30 days supply | Qty: 30 | Fill #8

## 2020-07-05 MED FILL — PROAIR HFA 90 MCG INHALER: 108 (90 BAS | 25 days supply | Qty: 9 | Fill #8

## 2020-07-09 DIAGNOSIS — J449 Chronic obstructive pulmonary disease, unspecified: Secondary | ICD-10-CM | POA: Diagnosis not present

## 2020-07-10 ENCOUNTER — Other Ambulatory Visit: Payer: Self-pay

## 2020-08-04 ENCOUNTER — Other Ambulatory Visit: Payer: Self-pay

## 2020-08-04 MED FILL — Budesonide-Formoterol Fumarate Dihyd Aerosol 160-4.5 MCG/ACT: RESPIRATORY_TRACT | 30 days supply | Qty: 10.2 | Fill #0 | Status: AC

## 2020-08-04 MED FILL — Albuterol Sulfate Inhal Aero 108 MCG/ACT (90MCG Base Equiv): RESPIRATORY_TRACT | 25 days supply | Qty: 8.5 | Fill #0 | Status: AC

## 2020-08-04 MED FILL — Tiotropium Bromide Monohydrate Inhal Cap 18 MCG (Base Equiv): RESPIRATORY_TRACT | 30 days supply | Qty: 30 | Fill #0 | Status: AC

## 2020-08-06 ENCOUNTER — Other Ambulatory Visit: Payer: Self-pay | Admitting: Family Medicine

## 2020-08-06 ENCOUNTER — Other Ambulatory Visit: Payer: Self-pay

## 2020-08-06 DIAGNOSIS — G5623 Lesion of ulnar nerve, bilateral upper limbs: Secondary | ICD-10-CM

## 2020-08-06 MED ORDER — GABAPENTIN 300 MG PO CAPS
ORAL_CAPSULE | Freq: Every day | ORAL | 1 refills | Status: DC
  Filled 2020-08-06: qty 30, 30d supply, fill #0
  Filled 2020-09-07: qty 90, 90d supply, fill #0
  Filled 2020-12-20: qty 90, 90d supply, fill #1

## 2020-08-08 DIAGNOSIS — J449 Chronic obstructive pulmonary disease, unspecified: Secondary | ICD-10-CM | POA: Diagnosis not present

## 2020-08-09 ENCOUNTER — Other Ambulatory Visit: Payer: Self-pay

## 2020-08-16 ENCOUNTER — Other Ambulatory Visit: Payer: Self-pay

## 2020-09-07 ENCOUNTER — Other Ambulatory Visit: Payer: Self-pay

## 2020-09-07 MED FILL — Budesonide-Formoterol Fumarate Dihyd Aerosol 160-4.5 MCG/ACT: RESPIRATORY_TRACT | 30 days supply | Qty: 10.2 | Fill #1 | Status: AC

## 2020-09-07 MED FILL — Tiotropium Bromide Monohydrate Inhal Cap 18 MCG (Base Equiv): RESPIRATORY_TRACT | 30 days supply | Qty: 30 | Fill #1 | Status: AC

## 2020-09-07 MED FILL — Albuterol Sulfate Inhal Aero 108 MCG/ACT (90MCG Base Equiv): RESPIRATORY_TRACT | 25 days supply | Qty: 8.5 | Fill #1 | Status: AC

## 2020-09-07 MED FILL — Allopurinol Tab 100 MG: ORAL | 90 days supply | Qty: 90 | Fill #0 | Status: AC

## 2020-09-08 DIAGNOSIS — J449 Chronic obstructive pulmonary disease, unspecified: Secondary | ICD-10-CM | POA: Diagnosis not present

## 2020-10-01 ENCOUNTER — Other Ambulatory Visit: Payer: Self-pay

## 2020-10-01 MED FILL — Tiotropium Bromide Monohydrate Inhal Cap 18 MCG (Base Equiv): RESPIRATORY_TRACT | 30 days supply | Qty: 30 | Fill #2 | Status: AC

## 2020-10-01 MED FILL — Metoprolol Succinate Tab ER 24HR 50 MG (Tartrate Equiv): ORAL | 90 days supply | Qty: 90 | Fill #0 | Status: AC

## 2020-10-01 MED FILL — Amlodipine Besylate Tab 10 MG (Base Equivalent): ORAL | 90 days supply | Qty: 90 | Fill #0 | Status: AC

## 2020-10-01 MED FILL — Budesonide-Formoterol Fumarate Dihyd Aerosol 160-4.5 MCG/ACT: RESPIRATORY_TRACT | 30 days supply | Qty: 10.2 | Fill #2 | Status: AC

## 2020-10-01 MED FILL — Ezetimibe Tab 10 MG: ORAL | 90 days supply | Qty: 90 | Fill #0 | Status: AC

## 2020-10-01 MED FILL — Losartan Potassium Tab 50 MG: ORAL | 90 days supply | Qty: 90 | Fill #0 | Status: AC

## 2020-10-05 ENCOUNTER — Other Ambulatory Visit: Payer: Self-pay

## 2020-10-05 ENCOUNTER — Telehealth: Payer: Self-pay | Admitting: Pulmonary Disease

## 2020-10-05 NOTE — Telephone Encounter (Signed)
Called and spoke with patient. He is ok with doing a video visit. He does not have MyChart and feels Mychart is not for him. I advised him that we can send a link to him the day of his visit for the video visit. He verified his mobile number.   Will change appt type.   Nothing further needed at time of call.

## 2020-10-05 NOTE — Telephone Encounter (Signed)
Spoke with pt who is requesting OV on 11/05/20 be changed to televisit. Pt states he perfers for visit to be televist because to does not want to be exposed to any illness r/t his hx of breathing problems. Dr. Vaughan Browner please advise on the change of visit type.

## 2020-10-05 NOTE — Telephone Encounter (Signed)
Yes we can do a MyChart video visit.  Please ensure that he has a MyChart account

## 2020-11-05 ENCOUNTER — Other Ambulatory Visit: Payer: Self-pay

## 2020-11-05 ENCOUNTER — Telehealth (INDEPENDENT_AMBULATORY_CARE_PROVIDER_SITE_OTHER): Payer: Medicaid Other | Admitting: Pulmonary Disease

## 2020-11-05 ENCOUNTER — Other Ambulatory Visit: Payer: Self-pay | Admitting: Family Medicine

## 2020-11-05 DIAGNOSIS — J449 Chronic obstructive pulmonary disease, unspecified: Secondary | ICD-10-CM

## 2020-11-05 NOTE — Patient Instructions (Addendum)
Compared to doing well with the breathing Follow-up in 1 year while he had COVID direct

## 2020-11-05 NOTE — Progress Notes (Signed)
[name] [name]    [id number]    September 27, 1960  Primary Care Physician:Newlin, Charlane Ferretti, MD  Referring Physician: Charlott Rakes, MD [place],  [place] [address]  Virtual Visit via Telephone Note  I connected with Devanta Juby on 10/22/19 at 10:30 AM EDT by telephone and verified that I am speaking with the correct person using two identifiers.  Location: Patient: Home Provider: [facility], [place], [place]   I discussed the limitations, risks, security and privacy concerns of performing an evaluation and management service by telephone and the availability of in person appointments. I also discussed with the patient that there may be a patient responsible charge related to this service. The patient expressed understanding and agreed to proceed.  Chief complaint:   Follow up for COPD GOLD B (Cat score 17, 0 exacerbations) Pulmonary HTN  HPI: [name] [name] is a 60 year old with extensive smoking history. His main complaint is dyspnea on exertion for the past several years. He has chronic cough with mucus production. Denies any wheezing, hemoptysis. He was maintained initially on just albuterol rescue inhaler. He is maintained on spiriva. He feels very currently. He has dyspnea on exertion which is not different from baseline. Denies any cough, sputum production, hemoptysis, fever, weight loss.  He has over 25 years pack year smoking history. Quit in 2020   Interim history: Continues on Symbicort and Spiriva On supplemental oxygen States that breathing is doing well with no issues  Outpatient Encounter Medications as of 04/17/2019  Medication Sig   albuterol (PROVENTIL) (2.5 MG/3ML) 0.083% nebulizer solution INHALE 3 MLS (1 VIAL) EVERY 6 HOURS VIA NEBULIAZATION ROUTE AS NEEDED FOR SHORTNESS OF BREATH (Patient taking differently: Take 2.5 mg by nebulization every 6 (six) hours as needed for shortness of breath. )   albuterol (VENTOLIN HFA) 108  (90 Base) MCG/ACT inhaler INHALE 2 PUFFS INTO THE LUNGS EVERY 4 HOURS AS NEEDED FOR WHEEZING OR SHORTNESS OF BREATH.   allopurinol (ZYLOPRIM) 100 MG tablet Take 1 tablet (100 mg total) by mouth daily.   amLODipine (NORVASC) 10 MG tablet Take 1 tablet (10 mg total) by mouth daily.   ammonium lactate (LAC-HYDRIN) 12 % lotion Apply 1 application topically daily.   AZO-CRANBERRY PO Take 2 tablets by mouth every 30 (thirty) days.   budesonide-formoterol (SYMBICORT) 160-4.5 MCG/ACT inhaler INAHLE 2 PUFFS INTO THE LUNGS TWICE DAILY.   Cyanocobalamin (VITAMIN B-12) 1000 MCG SUBL Place 1 tablet (1,000 mcg total) under the tongue daily.   cyclobenzaprine (FLEXERIL) 5 MG tablet Take 1 tablet (5 mg total) by mouth 2 (two) times daily as needed for muscle spasms.   diphenhydrAMINE (BENADRYL ALLERGY) 25 MG tablet Take 50 mg by mouth daily as needed (allergies).    ezetimibe (ZETIA) 10 MG tablet Take 1 tablet (10 mg total) by mouth daily.   gabapentin (NEURONTIN) 300 MG capsule Take 1 capsule (300 mg total) by mouth at bedtime.   hydrocortisone 1 % ointment Apply 1 application topically 2 (two) times daily.   losartan (COZAAR) 50 MG tablet Take 1 tablet (50 mg total) by mouth daily. Needs PCP appointment.   metoprolol succinate (TOPROL-XL) 50 MG 24 hr tablet Take 1 tablet (50 mg total) by mouth daily.   tiotropium (SPIRIVA HANDIHALER) 18 MCG inhalation capsule Place 1 capsule (18 mcg total) into inhaler and inhale daily.   triamcinolone ointment (KENALOG) 0.1 % APPLY 1 APPLICATION TOPICALLY 2 TIMES DAILY. (Patient taking differently: Apply 1  application topically 2 (two) times daily. )   [DISCONTINUED] ALBUTEROL IN Inhale into the lungs.   No facility-administered encounter medications on file as of 04/17/2019.   Physical Exam: tele  Data Reviewed: Imaging CTA scan 12/13/13-no PE, mild airway thickening, atelectasis, cardiomegaly   CT chest 10/30/2018-emphysema in the upper lobes.  Mild lower lobe  atelectasis I have reviewed the images personally.  PFTs. 07/22/15 2.19 [60%], FEV1 1.07 [37%],/49, DLCO 10.99 [38%] Severe obstruction with severe diffusion impairment   Cardiac Echo 10/27/14 Normal LV function; mild LVH and LVE; grade 1 diastolic dysfunction; moderate LAE; mild RAE; trace TR; severely elevated pulmonary pressure. PAP 72  Echo 07/21/2016 LVEF 123456, grade 1 diastolic dysfunction, PAP 42  Sleep study 06/15/15 No significant obstructive sleep apnea, moderate O2 desaturation  Labs Alpha-1 antitrypsin 06/16/2016-137, PI MM HIV 07/06/2016-negative SSB 7.9, double-stranded DNA, SSA, rheumatoid factor-negative Hepatic function panel 07/17/2017-negative  Assessment:  Severe COPD Continue Symbicort, Spiriva, rescue inhaler  Pulmonary hypertension. 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  Continue supplemental oxygen Repeat echo in 2018 shows improvement in PA pressures Work-up for alternative PAH etiologies including HIV, LFTs, ANA, RF, CCP is negative.  He has mild elevation in SSB which is likely nonspecific. We will continue to monitor  Heavy ex-smoker. We had referred for low-dose screening CT of the chest but he does not want to leave home due to fear of COVID-19 infection He is aware of the risk of undetected malignancy.  Health maintenance Prevnar 12/22/2013 Pneumovax 07/30/2012 He does not want to get the flu vaccine.  Plan/Recommendations: - Continue spiriva, symbicort - Continue supplemental O2  I discussed the assessment and treatment plan with the patient. The patient was provided an opportunity to ask questions and all were answered. The patient agreed with the plan and demonstrated an understanding of the instructions.   The patient was advised to call back or seek an in-person evaluation if the symptoms worsen or if the condition fails to improve as anticipated.  I provided 15 minutes of non-face-to-face  time during this encounter.  [name] MD [facility] 11/05/2020, 11:20 AM  CC: Charlott Rakes, MD

## 2020-11-08 ENCOUNTER — Other Ambulatory Visit: Payer: Self-pay | Admitting: Family Medicine

## 2020-11-08 ENCOUNTER — Telehealth: Payer: Self-pay | Admitting: Pulmonary Disease

## 2020-11-08 ENCOUNTER — Other Ambulatory Visit: Payer: Self-pay

## 2020-11-08 ENCOUNTER — Other Ambulatory Visit: Payer: Self-pay | Admitting: Pulmonary Disease

## 2020-11-08 DIAGNOSIS — J449 Chronic obstructive pulmonary disease, unspecified: Secondary | ICD-10-CM

## 2020-11-08 MED ORDER — ALBUTEROL SULFATE HFA 108 (90 BASE) MCG/ACT IN AERS
2.0000 | INHALATION_SPRAY | Freq: Four times a day (QID) | RESPIRATORY_TRACT | 11 refills | Status: DC | PRN
  Filled 2020-11-08: qty 8.5, 25d supply, fill #0
  Filled 2020-12-20: qty 8.5, 25d supply, fill #1
  Filled 2021-01-17: qty 8.5, 25d supply, fill #2

## 2020-11-08 MED ORDER — SPIRIVA HANDIHALER 18 MCG IN CAPS
1.0000 | ORAL_CAPSULE | Freq: Every day | RESPIRATORY_TRACT | 11 refills | Status: DC
Start: 2020-11-08 — End: 2021-11-22
  Filled 2020-11-08: qty 30, 30d supply, fill #0
  Filled 2020-12-20: qty 30, 30d supply, fill #1
  Filled 2021-01-17: qty 30, 30d supply, fill #2
  Filled 2021-02-18: qty 30, 30d supply, fill #3
  Filled 2021-03-21: qty 30, 30d supply, fill #4
  Filled 2021-04-20: qty 30, 30d supply, fill #0
  Filled 2021-05-23: qty 30, 30d supply, fill #1
  Filled 2021-06-21: qty 30, 30d supply, fill #2
  Filled 2021-07-20: qty 30, 30d supply, fill #3
  Filled 2021-08-22: qty 30, 30d supply, fill #4
  Filled 2021-09-21: qty 30, 30d supply, fill #5
  Filled 2021-10-17: qty 30, 30d supply, fill #6

## 2020-11-08 MED ORDER — ALBUTEROL SULFATE (2.5 MG/3ML) 0.083% IN NEBU
INHALATION_SOLUTION | RESPIRATORY_TRACT | 11 refills | Status: DC
  Filled 2020-11-08: qty 360, 30d supply, fill #0
  Filled 2021-03-21: qty 360, 30d supply, fill #1

## 2020-11-08 MED ORDER — BUDESONIDE-FORMOTEROL FUMARATE 160-4.5 MCG/ACT IN AERO
INHALATION_SPRAY | RESPIRATORY_TRACT | 11 refills | Status: DC
  Filled 2020-11-08: qty 10.2, 30d supply, fill #0
  Filled 2020-12-20: qty 10.2, 30d supply, fill #1
  Filled 2021-01-17: qty 10.2, 30d supply, fill #2
  Filled 2021-02-18: qty 10.2, 30d supply, fill #3
  Filled 2021-03-21: qty 10.2, 30d supply, fill #4
  Filled 2021-04-11: qty 10.2, 30d supply, fill #5
  Filled 2021-04-20: qty 10.2, 30d supply, fill #0
  Filled 2021-05-03 – 2021-05-23 (×2): qty 10.2, 30d supply, fill #1
  Filled 2021-06-21: qty 10.2, 30d supply, fill #2
  Filled 2021-07-20: qty 10.2, 30d supply, fill #3
  Filled 2021-08-22: qty 10.2, 30d supply, fill #4
  Filled 2021-09-21: qty 10.2, 30d supply, fill #5
  Filled 2021-10-17: qty 10.2, 30d supply, fill #6

## 2020-11-08 NOTE — Telephone Encounter (Signed)
Called and spoke with patient who is requesting refill on his inhalers. Patient did video visit on 11/05/20 so is good to refill medications. Refills have been sent to preferred pharmacy. Nothing further needed at this time.

## 2020-11-09 ENCOUNTER — Other Ambulatory Visit: Payer: Self-pay

## 2020-11-10 ENCOUNTER — Other Ambulatory Visit: Payer: Self-pay

## 2020-11-30 ENCOUNTER — Encounter (HOSPITAL_COMMUNITY): Payer: Self-pay

## 2020-11-30 ENCOUNTER — Telehealth (HOSPITAL_COMMUNITY): Payer: Self-pay | Admitting: Emergency Medicine

## 2020-11-30 ENCOUNTER — Other Ambulatory Visit (HOSPITAL_BASED_OUTPATIENT_CLINIC_OR_DEPARTMENT_OTHER): Payer: Self-pay

## 2020-11-30 ENCOUNTER — Other Ambulatory Visit: Payer: Self-pay

## 2020-11-30 ENCOUNTER — Ambulatory Visit (HOSPITAL_COMMUNITY)
Admission: EM | Admit: 2020-11-30 | Discharge: 2020-11-30 | Disposition: A | Discharge status: Home / Self Care | Payer: Medicaid Other | Attending: Physician Assistant | Admitting: Physician Assistant

## 2020-11-30 DIAGNOSIS — U071 COVID-19: Secondary | ICD-10-CM | POA: Diagnosis not present

## 2020-11-30 DIAGNOSIS — J449 Chronic obstructive pulmonary disease, unspecified: Secondary | ICD-10-CM

## 2020-11-30 DIAGNOSIS — R059 Cough, unspecified: Secondary | ICD-10-CM

## 2020-11-30 MED ORDER — MOLNUPIRAVIR EUA 200MG CAPSULE: 4.0000 | ORAL_CAPSULE | Freq: Two times a day (BID) | ORAL | 0 refills | Status: DC

## 2020-11-30 MED ORDER — MOLNUPIRAVIR EUA 200MG CAPSULE
4.0000 | ORAL_CAPSULE | Freq: Two times a day (BID) | ORAL | 0 refills | Status: DC
  Filled 2020-11-30: qty 40, 5d supply, fill #0

## 2020-11-30 MED ORDER — PREDNISONE 20 MG PO TABS
20.0000 mg | ORAL_TABLET | Freq: Every day | ORAL | 0 refills | Status: DC
Start: 2020-11-30 — End: 2022-05-10

## 2020-11-30 MED ORDER — PREDNISONE 20 MG PO TABS
20.0000 mg | ORAL_TABLET | Freq: Every day | ORAL | 0 refills | Status: DC
  Filled 2020-11-30: qty 4, 4d supply, fill #0

## 2020-11-30 NOTE — Telephone Encounter (Signed)
Received message to sent todays scripts to [facility].  Rerouted 2 scripts to [facility]: prednisone and molnupiravir.

## 2020-11-30 NOTE — ED Triage Notes (Signed)
Pt reports he took an at home COVID test that came out positive. Pt states he does not have sxs. States he has COPD and is often SOB.

## 2020-11-30 NOTE — ED Provider Notes (Signed)
[facility]    CSN: [id number] Arrival date & time: 11/30/20  1138      History   Chief Complaint Chief Complaint  Patient presents with   Covid Positive    HPI Demarion Overbaugh is a 60 y.o. male.   Patient has a past medical history of CKD, oxygen dependent COPD, pulmonary hypertension, hypertension.  Reports that he was exposed to someone who was positive for COVID-19 and took a COVID test at home that was positive prompting evaluation today.  He denies any significant symptoms other than a dry cough and nasal congestion but states these are chronic and unchanged from baseline related to oxygen use.  He has not required an increase use of oxygen.  He has not had any COVID-19 vaccinations.  He is concerned about disease progression given his history and interested in starting antiviral medication.  He has been using medications including inhaler regimen as prescribed.   Past Medical History:  Diagnosis Date   Asthma 1999   CKD (chronic kidney disease), 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   COPD (chronic obstructive pulmonary disease) ([place]) 12/15/2013   Essential hypertension    Gout    Influenza with respiratory manifestations 07/30/2012   Pneumonia    Thrombocytopenia ([facility]) 07/28/2012   Tobacco abuse 07/29/2012    Patient Active Problem List   Diagnosis Date Noted   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   COPD without exacerbation ([place]) 10/30/2016   Chronic respiratory failure with hypoxia ([place]) 10/30/2016   Obesity (BMI 30.0-34.9) 09/17/2015   Bilateral carpal tunnel syndrome 09/09/2015   Ulnar neuropathy of both upper extremities 09/09/2015   Numbness of left hand 01/21/2015   Abnormal EKG 10/27/2014   Moderate to severe pulmonary hypertension ([place]) 12/14/2013   Cocaine abuse in remission ([place]) 12/11/2013   Hypertension 12/11/2013   COPD bronchitis 08/08/2012   Gout 08/08/2012   CKD (chronic kidney disease) stage 3, GFR 30-59 ml/min (HCC) 08/03/2012   Tobacco abuse, in remission  07/29/2012    Past Surgical History:  Procedure Laterality Date   FRACTURE SURGERY  childhood   right arm   KNEE SURGERY     right knee s/p trauma       Home Medications    Prior to Admission medications   Medication Sig Start Date End Date Taking? Authorizing Provider  molnupiravir EUA 200 mg CAPS Take 4 capsules (800 mg total) by mouth 2 (two) times daily for 5 days. 11/30/20 12/05/20 Yes Geryl Dohn, Junie Panning K, PA-C  predniSONE (DELTASONE) 20 MG tablet Take 1 tablet (20 mg total) by mouth daily. 11/30/20  Yes Donavin Audino K, PA-C  albuterol (PROAIR HFA) 108 (90 Base) MCG/ACT inhaler INHALE 2 PUFFS INTO THE LUNGS EVERY 6 (SIX) HOURS AS NEEDED FOR WHEEZING OR SHORTNESS OF BREATH. 11/08/20 11/08/21  Mannam, Hart Robinsons, MD  albuterol (PROVENTIL) (2.5 MG/3ML) 0.083% nebulizer solution TAKE 3 MLS (2.5 MG TOTAL) BY NEBULIZATION EVERY 6 (SIX) HOURS AS NEEDED FOR SHORTNESS OF BREATH. 11/08/20 11/08/21  Mannam, Hart Robinsons, MD  allopurinol (ZYLOPRIM) 100 MG tablet TAKE 1 TABLET (100 MG TOTAL) BY MOUTH DAILY. 05/10/20 05/10/21  Charlott Rakes, MD  amLODipine (NORVASC) 10 MG tablet TAKE 1 TABLET (10 MG TOTAL) BY MOUTH DAILY. 06/08/20 06/08/21  Charlott Rakes, MD  ammonium lactate (LAC-HYDRIN) 12 % lotion Apply 1 application topically daily. 08/28/18   [provider]  budesonide-formoterol (SYMBICORT) 160-4.5 MCG/ACT inhaler INAHLE 2 PUFFS INTO THE LUNGS TWICE DAILY. 11/08/20 11/08/21  [name], MD  Cyanocobalamin (VITAMIN B-12) 1000 MCG SUBL Place 1  tablet (1,000 mcg total) under the tongue daily. 05/13/15   Narda Amber K, DO  cyclobenzaprine (FLEXERIL) 5 MG tablet Take 1 tablet (5 mg total) by mouth 2 (two) times daily as needed for muscle spasms. 10/30/18   Tegeler, Gwenyth Allegra, MD  diphenhydrAMINE (BENADRYL) 25 MG tablet Take 50 mg by mouth daily as needed (allergies).     [provider]  ezetimibe (ZETIA) 10 MG tablet TAKE 1 TABLET (10 MG TOTAL) BY MOUTH DAILY. 06/09/20 06/09/21  Charlott Rakes, MD   gabapentin (NEURONTIN) 300 MG capsule TAKE 1 CAPSULE (300 MG TOTAL) BY MOUTH AT BEDTIME. 08/06/20 08/06/21  Charlott Rakes, MD  hydrocortisone 1 % ointment Apply 1 application topically 2 (two) times daily. 12/30/18   Charlott Rakes, MD  losartan (COZAAR) 50 MG tablet TAKE 1 TABLET (50 MG TOTAL) BY MOUTH DAILY. 06/08/20 06/08/21  Charlott Rakes, MD  metoprolol succinate (TOPROL-XL) 50 MG 24 hr tablet TAKE 1 TABLET (50 MG TOTAL) BY MOUTH DAILY. 06/08/20 06/08/21  Charlott Rakes, MD  tiotropium (SPIRIVA HANDIHALER) 18 MCG inhalation capsule PLACE 1 CAPSULE (18 MCG TOTAL) INTO INHALER AND INHALE DAILY. 11/08/20 11/08/21  Mannam, Hart Robinsons, MD  triamcinolone ointment (KENALOG) 0.1 % APPLY 1 APPLICATION TOPICALLY 2 TIMES DAILY. Patient taking differently: Apply 1 application topically 2 (two) times daily. 04/12/17   Charlott Rakes, MD  ALBUTEROL IN Inhale into the lungs.  07/03/11  [provider]    Family History Family History  Problem Relation Age of Onset   Cancer Mother        colon    Heart disease Father    Colon cancer Other        mother ? age of diagnosis    Healthy Son    Healthy Daughter     Social History Social History   Tobacco Use   Smoking status: Former    Packs/day: 1.00    Years: 45.00    Pack years: 45.00    Types: Cigarettes    Quit date: 12/15/2013    Years since quitting: 6.9   Smokeless tobacco: Never   Tobacco comments:    'Quitting"  Vaping Use   Vaping Use: Never used  Substance Use Topics   Alcohol use: Yes    Alcohol/week: 0.0 standard drinks    Comment: occaisional beer - previously one beer daily, stopped 4 months   Drug use: No    Types: Cocaine    Comment: 10/13/14: last use of cocaine one month ago      Allergies   Atorvastatin, Shellfish allergy, and Colchicine   Review of Systems Review of Systems  Constitutional:  Negative for activity change, appetite change, fatigue and fever.  HENT:  Negative for congestion (chronic), sinus pressure,  sneezing and sore throat.   Respiratory:  Positive for cough (chronic) and shortness of breath (at baseline).   Cardiovascular:  Negative for chest pain.  Gastrointestinal:  Negative for abdominal pain, diarrhea, nausea and vomiting.  Musculoskeletal:  Negative for arthralgias and myalgias.  Neurological:  Negative for dizziness, light-headedness and headaches.    Physical Exam Triage Vital Signs ED Triage Vitals  Enc Vitals Group     BP 11/30/20 1251 (!) 146/93     Pulse Rate 11/30/20 1251 89     Resp 11/30/20 1251 17     Temp 11/30/20 1251 98 F (36.7 C)     Temp Source 11/30/20 1251 Oral     SpO2 11/30/20 1251 94 %     Weight --  Height --      Head Circumference --      Peak Flow --      Pain Score 11/30/20 1250 0     Pain Loc --      Pain Edu? --      Excl. in [place]? --    No data found.  Updated Vital Signs BP 133/90 (BP Location: Right Arm)   Pulse 89   Temp 98 F (36.7 C) (Oral)   Resp 17   SpO2 94%   Visual Acuity Right Eye Distance:   Left Eye Distance:   Bilateral Distance:    Right Eye Near:   Left Eye Near:    Bilateral Near:     Physical Exam Vitals reviewed.  Constitutional:      General: He is awake.     Appearance: Normal appearance. He is normal weight. He is not ill-appearing.  HENT:     Head: Normocephalic and atraumatic.     Right Ear: Tympanic membrane, ear canal and external ear normal. Tympanic membrane is not erythematous or bulging.     Left Ear: Tympanic membrane, ear canal and external ear normal. Tympanic membrane is not erythematous or bulging.     Nose: Nose normal.     Mouth/Throat:     Pharynx: Uvula midline. No oropharyngeal exudate or posterior oropharyngeal erythema.  Cardiovascular:     Rate and Rhythm: Normal rate and regular rhythm.     Heart sounds: Normal heart sounds, S1 normal and S2 normal. No murmur heard. Pulmonary:     Effort: Pulmonary effort is normal. No accessory muscle usage or respiratory distress.      Breath sounds: Normal breath sounds. No stridor. No wheezing, rhonchi or rales.     Comments: Decreased aeration to bases but no adventitious sounds on exam. Abdominal:     Palpations: Abdomen is soft.     Tenderness: There is no abdominal tenderness.  Lymphadenopathy:     Head:     Right side of head: No submental, submandibular or tonsillar adenopathy.     Left side of head: No submental, submandibular or tonsillar adenopathy.     Cervical: No cervical adenopathy.  Neurological:     Mental Status: He is alert.  Psychiatric:        Behavior: Behavior is cooperative.     UC Treatments / Results  Labs (all labs ordered are listed, but only abnormal results are displayed) Labs Reviewed - No data to display  EKG   Radiology No results found.  Procedures Procedures (including critical care time)  Medications Ordered in UC Medications - No data to display  Initial Impression / Assessment and Plan / UC Course  I have reviewed the triage vital signs and the nursing notes.  Pertinent labs & imaging results that were available during my care of the patient were reviewed by me and considered in my medical decision making (see chart for details).      Vital signs are reassuring today patient appears well.  He is not requiring increased use of oxygen at this time.  Discussed that he is a candidate for oral antivirals given past medical history.  He is currently taking a LABA as well as amlodipine we discussed that he would have to change his medication significantly if he were to take oral antivirals.  Given COPD I do not feel it is reasonable to discontinue maintenance medications at this time so will prescribe molnupiravir.  Called and discussed this with Paxlovid  outpatient pharmacist at [facility] who agreed that he would be a better candidate for molnupiravir compared to Paxlovid particularly if interested in taking prednisone.  We will start prednisone 20  mg for 4 days to help with additional symptom relief.  He was encouraged to continue medication as prescribed including maintenance medication.  Recommended that he follow-up with his pulmonologist or primary care provider within 24 to 48 hours to ensure symptom improvement.  Recommended he obtain pulse oximeter and monitor his oxygenation and if this is under 93% he needs to be evaluated quickly but if under 90% needs to go to the emergency room.  We discussed at length alarm symptoms that warrant emergent evaluation.  Strict return precautions given.  Patient expressed understanding.  Final Clinical Impressions(s) / UC Diagnoses   Final diagnoses:  COVID-19  Chronic obstructive pulmonary disease, unspecified COPD type ([place])  Cough     Discharge Instructions      We are going to start you on one of the antiviral medications to help decrease the risk of severe infection from COVID-19.  I have also called in some prednisone 20 mg there would like you to take daily for 4 days.  This will help with your COPD.  Please get a pulse oximeter from the pharmacy as we discussed and monitor your oxygenation.  If you are going under 93% you need to be contacting her primary care provider/pulmonologist/coming back to see [place].  If you are under 90% you need to go to the emergency room.  You are at high risk for disease progression and if anything is worsening you must go to the ER.  Please follow-up with your primary care provider or pulmonologist within 24 to 48 hours.     ED Prescriptions     Medication Sig Dispense Auth. Provider   molnupiravir EUA 200 mg CAPS Take 4 capsules (800 mg total) by mouth 2 (two) times daily for 5 days. 40 capsule Shadana Pry K, PA-C   predniSONE (DELTASONE) 20 MG tablet Take 1 tablet (20 mg total) by mouth daily. 4 tablet Kimberle Stanfill, Derry Skill, PA-C      PDMP not reviewed this encounter.   Terrilee Croak, PA-C 11/30/20 1426

## 2020-11-30 NOTE — Discharge Instructions (Addendum)
We are going to start you on one of the antiviral medications to help decrease the risk of severe infection from COVID-19.  I have also called in some prednisone 20 mg there would like you to take daily for 4 days.  This will help with your COPD.  Please get a pulse oximeter from the pharmacy as we discussed and monitor your oxygenation.  If you are going under 93% you need to be contacting her primary care provider/pulmonologist/coming back to see [place].  If you are under 90% you need to go to the emergency room.  You are at high risk for disease progression and if anything is worsening you must go to the ER.  Please follow-up with your primary care provider or pulmonologist within 24 to 48 hours.

## 2020-12-01 ENCOUNTER — Other Ambulatory Visit: Payer: Self-pay

## 2020-12-04 ENCOUNTER — Emergency Department (HOSPITAL_COMMUNITY): Payer: Medicaid Other

## 2020-12-04 ENCOUNTER — Other Ambulatory Visit: Payer: Self-pay

## 2020-12-04 ENCOUNTER — Encounter (HOSPITAL_COMMUNITY): Payer: Self-pay

## 2020-12-04 ENCOUNTER — Inpatient Hospital Stay (HOSPITAL_COMMUNITY)
Admission: EM | Admit: 2020-12-04 | Discharge: 2020-12-14 | DRG: 177 | Disposition: A | Discharge status: Home / Self Care | Payer: Medicaid Other | Attending: Internal Medicine | Admitting: Internal Medicine

## 2020-12-04 DIAGNOSIS — Z91013 Allergy to seafood: Secondary | ICD-10-CM | POA: Diagnosis not present

## 2020-12-04 DIAGNOSIS — U071 COVID-19: Secondary | ICD-10-CM | POA: Diagnosis not present

## 2020-12-04 DIAGNOSIS — F17201 Nicotine dependence, unspecified, in remission: Secondary | ICD-10-CM

## 2020-12-04 DIAGNOSIS — Z888 Allergy status to other drugs, medicaments and biological substances status: Secondary | ICD-10-CM

## 2020-12-04 DIAGNOSIS — R0602 Shortness of breath: Secondary | ICD-10-CM | POA: Diagnosis not present

## 2020-12-04 DIAGNOSIS — N182 Chronic kidney disease, stage 2 (mild): Secondary | ICD-10-CM | POA: Diagnosis present

## 2020-12-04 DIAGNOSIS — M109 Gout, unspecified: Secondary | ICD-10-CM | POA: Diagnosis not present

## 2020-12-04 DIAGNOSIS — Z6837 Body mass index (BMI) 37.0-37.9, adult: Secondary | ICD-10-CM

## 2020-12-04 DIAGNOSIS — Z800 Family history of malignant neoplasm of digestive organs: Secondary | ICD-10-CM | POA: Diagnosis not present

## 2020-12-04 DIAGNOSIS — J440 Chronic obstructive pulmonary disease with acute lower respiratory infection: Secondary | ICD-10-CM | POA: Diagnosis present

## 2020-12-04 DIAGNOSIS — R0689 Other abnormalities of breathing: Secondary | ICD-10-CM | POA: Diagnosis not present

## 2020-12-04 DIAGNOSIS — Z79899 Other long term (current) drug therapy: Secondary | ICD-10-CM | POA: Diagnosis not present

## 2020-12-04 DIAGNOSIS — R069 Unspecified abnormalities of breathing: Secondary | ICD-10-CM | POA: Diagnosis not present

## 2020-12-04 DIAGNOSIS — J9621 Acute and chronic respiratory failure with hypoxia: Secondary | ICD-10-CM | POA: Diagnosis not present

## 2020-12-04 DIAGNOSIS — I10 Essential (primary) hypertension: Secondary | ICD-10-CM | POA: Diagnosis present

## 2020-12-04 DIAGNOSIS — Z8249 Family history of ischemic heart disease and other diseases of the circulatory system: Secondary | ICD-10-CM

## 2020-12-04 DIAGNOSIS — Z87891 Personal history of nicotine dependence: Secondary | ICD-10-CM

## 2020-12-04 DIAGNOSIS — E785 Hyperlipidemia, unspecified: Secondary | ICD-10-CM | POA: Diagnosis present

## 2020-12-04 DIAGNOSIS — E669 Obesity, unspecified: Secondary | ICD-10-CM | POA: Diagnosis present

## 2020-12-04 DIAGNOSIS — Z9981 Dependence on supplemental oxygen: Secondary | ICD-10-CM

## 2020-12-04 DIAGNOSIS — J1282 Pneumonia due to coronavirus disease 2019: Secondary | ICD-10-CM | POA: Diagnosis present

## 2020-12-04 DIAGNOSIS — Z7952 Long term (current) use of systemic steroids: Secondary | ICD-10-CM | POA: Diagnosis not present

## 2020-12-04 DIAGNOSIS — I129 Hypertensive chronic kidney disease with stage 1 through stage 4 chronic kidney disease, or unspecified chronic kidney disease: Secondary | ICD-10-CM | POA: Diagnosis not present

## 2020-12-04 DIAGNOSIS — J9601 Acute respiratory failure with hypoxia: Secondary | ICD-10-CM | POA: Diagnosis not present

## 2020-12-04 DIAGNOSIS — R9431 Abnormal electrocardiogram [ECG] [EKG]: Secondary | ICD-10-CM | POA: Diagnosis not present

## 2020-12-04 DIAGNOSIS — J449 Chronic obstructive pulmonary disease, unspecified: Secondary | ICD-10-CM | POA: Diagnosis present

## 2020-12-04 DIAGNOSIS — Z7951 Long term (current) use of inhaled steroids: Secondary | ICD-10-CM | POA: Diagnosis not present

## 2020-12-04 LAB — HEPATIC FUNCTION PANEL
ALT: 161 U/L — ABNORMAL HIGH (ref 0–44)
AST: 125 U/L — ABNORMAL HIGH (ref 15–41)
Albumin: 2.8 g/dL — ABNORMAL LOW (ref 3.5–5.0)
Alkaline Phosphatase: 64 U/L (ref 38–126)
Bilirubin, Direct: 0.3 mg/dL — ABNORMAL HIGH (ref 0.0–0.2)
Indirect Bilirubin: 0.8 mg/dL (ref 0.3–0.9)
Total Bilirubin: 1.1 mg/dL (ref 0.3–1.2)
Total Protein: 6.8 g/dL (ref 6.5–8.1)

## 2020-12-04 LAB — CBC
HCT: 46 % (ref 39.0–52.0)
Hemoglobin: 13.7 g/dL (ref 13.0–17.0)
MCH: 23.7 pg — ABNORMAL LOW (ref 26.0–34.0)
MCHC: 29.8 g/dL — ABNORMAL LOW (ref 30.0–36.0)
MCV: 79.4 fL — ABNORMAL LOW (ref 80.0–100.0)
Platelets: 166 10*3/uL (ref 150–400)
RBC: 5.79 MIL/uL (ref 4.22–5.81)
RDW: 13 % (ref 11.5–15.5)
WBC: 8 10*3/uL (ref 4.0–10.5)
nRBC: 0 % (ref 0.0–0.2)

## 2020-12-04 LAB — RESP PANEL BY RT-PCR (FLU A&B, COVID) ARPGX2
Influenza A by PCR: NEGATIVE
Influenza B by PCR: NEGATIVE
SARS Coronavirus 2 by RT PCR: POSITIVE — AB

## 2020-12-04 LAB — BASIC METABOLIC PANEL
Anion gap: 8 (ref 5–15)
BUN: 10 mg/dL (ref 8–23)
CO2: 35 mmol/L — ABNORMAL HIGH (ref 22–32)
Calcium: 8.4 mg/dL — ABNORMAL LOW (ref 8.9–10.3)
Chloride: 92 mmol/L — ABNORMAL LOW (ref 98–111)
Creatinine, Ser: 1.17 mg/dL (ref 0.61–1.24)
GFR, Estimated: >60 mL/min (ref >60)
Glucose, Bld: 118 mg/dL — ABNORMAL HIGH (ref 70–99)
Potassium: 3.5 mmol/L (ref 3.5–5.1)
Sodium: 135 mmol/L (ref 135–145)

## 2020-12-04 LAB — RAPID URINE DRUG SCREEN, HOSP PERFORMED
Amphetamines: NOT DETECTED
Barbiturates: NOT DETECTED
Benzodiazepines: NOT DETECTED
Cocaine: NOT DETECTED
Opiates: NOT DETECTED
Tetrahydrocannabinol: NOT DETECTED

## 2020-12-04 LAB — LACTATE DEHYDROGENASE: LDH: 271 U/L — ABNORMAL HIGH (ref 98–192)

## 2020-12-04 LAB — TROPONIN I (HIGH SENSITIVITY)
Troponin I (High Sensitivity): 22 ng/L — ABNORMAL HIGH (ref <18)
Troponin I (High Sensitivity): 22 ng/L — ABNORMAL HIGH (ref <18)

## 2020-12-04 LAB — C-REACTIVE PROTEIN: CRP: 1.1 mg/dL — ABNORMAL HIGH (ref <1.0)

## 2020-12-04 LAB — FERRITIN: Ferritin: 1110 ng/mL — ABNORMAL HIGH (ref 24–336)

## 2020-12-04 LAB — D-DIMER, QUANTITATIVE: D-Dimer, Quant: 1.02 ug/mL-FEU — ABNORMAL HIGH (ref 0.00–0.50)

## 2020-12-04 LAB — HIV ANTIBODY (ROUTINE TESTING W REFLEX): HIV Screen 4th Generation wRfx: NONREACTIVE

## 2020-12-04 LAB — PROCALCITONIN: Procalcitonin: 0.19 ng/mL

## 2020-12-04 LAB — FIBRINOGEN: Fibrinogen: 361 mg/dL (ref 210–475)

## 2020-12-04 MED ORDER — SODIUM CHLORIDE 0.9% FLUSH
3.0000 mL | Freq: Two times a day (BID) | INTRAVENOUS | Status: DC
  Administered 2020-12-04 – 2020-12-14 (×19): 3 mL via INTRAVENOUS

## 2020-12-04 MED ORDER — PREDNISONE 50 MG PO TABS: 50.0000 mg | ORAL_TABLET | Freq: Every day | ORAL | Status: DC

## 2020-12-04 MED ORDER — LOSARTAN POTASSIUM 50 MG PO TABS
50.0000 mg | ORAL_TABLET | Freq: Every day | ORAL | Status: DC
  Administered 2020-12-05 – 2020-12-14 (×10): 50 mg via ORAL
  Filled 2020-12-04 (×10): qty 1

## 2020-12-04 MED ORDER — TOLNAFTATE 1 % EX POWD
Freq: Two times a day (BID) | CUTANEOUS | Status: DC
  Filled 2020-12-04: qty 45

## 2020-12-04 MED ORDER — GUAIFENESIN-DM 100-10 MG/5ML PO SYRP
10.0000 mL | ORAL_SOLUTION | ORAL | Status: DC | PRN
  Administered 2020-12-05 – 2020-12-14 (×22): 10 mL via ORAL
  Filled 2020-12-04 (×22): qty 10

## 2020-12-04 MED ORDER — ACETAMINOPHEN 325 MG PO TABS: 650.0000 mg | ORAL_TABLET | Freq: Four times a day (QID) | ORAL | Status: DC | PRN

## 2020-12-04 MED ORDER — ALBUTEROL SULFATE HFA 108 (90 BASE) MCG/ACT IN AERS
2.0000 | INHALATION_SPRAY | RESPIRATORY_TRACT | Status: DC | PRN
  Administered 2020-12-04: 2 via RESPIRATORY_TRACT
  Filled 2020-12-04: qty 6.7

## 2020-12-04 MED ORDER — SODIUM CHLORIDE 0.9 % IV SOLN
250.0000 mL | INTRAVENOUS | Status: DC | PRN
  Administered 2020-12-05: 250 mL via INTRAVENOUS

## 2020-12-04 MED ORDER — ASCORBIC ACID 500 MG PO TABS
500.0000 mg | ORAL_TABLET | Freq: Every day | ORAL | Status: DC
  Administered 2020-12-04 – 2020-12-14 (×11): 500 mg via ORAL
  Filled 2020-12-04 (×11): qty 1

## 2020-12-04 MED ORDER — ONDANSETRON HCL 4 MG PO TABS: 4.0000 mg | ORAL_TABLET | Freq: Four times a day (QID) | ORAL | Status: DC | PRN

## 2020-12-04 MED ORDER — SODIUM CHLORIDE 0.9% FLUSH
3.0000 mL | Freq: Two times a day (BID) | INTRAVENOUS | Status: DC
  Administered 2020-12-04 – 2020-12-14 (×12): 3 mL via INTRAVENOUS

## 2020-12-04 MED ORDER — METOPROLOL SUCCINATE ER 50 MG PO TB24
50.0000 mg | ORAL_TABLET | Freq: Every day | ORAL | Status: DC
  Administered 2020-12-05 – 2020-12-14 (×10): 50 mg via ORAL
  Filled 2020-12-04 (×8): qty 1
  Filled 2020-12-04: qty 2
  Filled 2020-12-04 (×2): qty 1

## 2020-12-04 MED ORDER — ZINC SULFATE 220 (50 ZN) MG PO CAPS
220.0000 mg | ORAL_CAPSULE | Freq: Every day | ORAL | Status: DC
  Administered 2020-12-04 – 2020-12-14 (×11): 220 mg via ORAL
  Filled 2020-12-04 (×11): qty 1

## 2020-12-04 MED ORDER — METHYLPREDNISOLONE SODIUM SUCC 125 MG IJ SOLR
50.0000 mg | Freq: Two times a day (BID) | INTRAMUSCULAR | Status: DC
Start: 2020-12-04 — End: 2020-12-06
  Administered 2020-12-04 – 2020-12-06 (×4): 50 mg via INTRAVENOUS
  Filled 2020-12-04 (×4): qty 2

## 2020-12-04 MED ORDER — OXYCODONE HCL 5 MG PO TABS
5.0000 mg | ORAL_TABLET | ORAL | Status: DC | PRN
  Filled 2020-12-04: qty 1

## 2020-12-04 MED ORDER — DEXAMETHASONE 4 MG PO TABS
6.0000 mg | ORAL_TABLET | Freq: Every day | ORAL | Status: DC
  Administered 2020-12-04: 6 mg via ORAL
  Filled 2020-12-04: qty 2

## 2020-12-04 MED ORDER — DOCUSATE SODIUM 100 MG PO CAPS
100.0000 mg | ORAL_CAPSULE | Freq: Two times a day (BID) | ORAL | Status: DC
  Administered 2020-12-04 – 2020-12-14 (×19): 100 mg via ORAL
  Filled 2020-12-04 (×20): qty 1

## 2020-12-04 MED ORDER — SODIUM CHLORIDE 0.9 % IV SOLN
100.0000 mg | Freq: Every day | INTRAVENOUS | Status: AC
  Administered 2020-12-05 – 2020-12-08 (×4): 100 mg via INTRAVENOUS
  Filled 2020-12-04: qty 100
  Filled 2020-12-04 (×3): qty 20

## 2020-12-04 MED ORDER — ENOXAPARIN SODIUM 40 MG/0.4ML IJ SOSY
40.0000 mg | PREFILLED_SYRINGE | INTRAMUSCULAR | Status: DC
  Administered 2020-12-04 – 2020-12-13 (×10): 40 mg via SUBCUTANEOUS
  Filled 2020-12-04 (×10): qty 0.4

## 2020-12-04 MED ORDER — ACETAMINOPHEN 325 MG PO TABS: 650.0000 mg | ORAL_TABLET | Freq: Once | ORAL | Status: DC | PRN

## 2020-12-04 MED ORDER — TERBINAFINE HCL 1 % EX CREA
TOPICAL_CREAM | Freq: Every day | CUTANEOUS | Status: DC
  Filled 2020-12-04 (×3): qty 12

## 2020-12-04 MED ORDER — TIOTROPIUM BROMIDE MONOHYDRATE 18 MCG IN CAPS: 1.0000 | ORAL_CAPSULE | Freq: Every day | RESPIRATORY_TRACT | Status: DC

## 2020-12-04 MED ORDER — POLYETHYLENE GLYCOL 3350 17 G PO PACK: 17.0000 g | PACK | Freq: Every day | ORAL | Status: DC | PRN

## 2020-12-04 MED ORDER — ONDANSETRON HCL 4 MG/2ML IJ SOLN: 4.0000 mg | Freq: Four times a day (QID) | INTRAMUSCULAR | Status: DC | PRN

## 2020-12-04 MED ORDER — SODIUM CHLORIDE 0.9% FLUSH
3.0000 mL | INTRAVENOUS | Status: DC | PRN
  Administered 2020-12-11: 3 mL via INTRAVENOUS

## 2020-12-04 MED ORDER — UMECLIDINIUM BROMIDE 62.5 MCG/INH IN AEPB
1.0000 | INHALATION_SPRAY | Freq: Every day | RESPIRATORY_TRACT | Status: DC
  Administered 2020-12-04 – 2020-12-14 (×11): 1 via RESPIRATORY_TRACT
  Filled 2020-12-04 (×2): qty 7

## 2020-12-04 MED ORDER — BISACODYL 5 MG PO TBEC: 5.0000 mg | DELAYED_RELEASE_TABLET | Freq: Every day | ORAL | Status: DC | PRN

## 2020-12-04 MED ORDER — GABAPENTIN 300 MG PO CAPS
300.0000 mg | ORAL_CAPSULE | Freq: Every day | ORAL | Status: DC
  Administered 2020-12-04 – 2020-12-13 (×10): 300 mg via ORAL
  Filled 2020-12-04 (×10): qty 1

## 2020-12-04 MED ORDER — HYDROCOD POLST-CPM POLST ER 10-8 MG/5ML PO SUER
5.0000 mL | Freq: Two times a day (BID) | ORAL | Status: DC | PRN
  Administered 2020-12-06 – 2020-12-12 (×7): 5 mL via ORAL
  Filled 2020-12-04 (×8): qty 5

## 2020-12-04 MED ORDER — ALBUTEROL SULFATE HFA 108 (90 BASE) MCG/ACT IN AERS
2.0000 | INHALATION_SPRAY | RESPIRATORY_TRACT | Status: DC | PRN
  Administered 2020-12-05 – 2020-12-12 (×10): 2 via RESPIRATORY_TRACT
  Filled 2020-12-04 (×2): qty 6.7

## 2020-12-04 MED ORDER — AMLODIPINE BESYLATE 10 MG PO TABS
10.0000 mg | ORAL_TABLET | Freq: Every day | ORAL | Status: DC
  Administered 2020-12-05 – 2020-12-14 (×10): 10 mg via ORAL
  Filled 2020-12-04 (×8): qty 1
  Filled 2020-12-04: qty 2
  Filled 2020-12-04 (×2): qty 1

## 2020-12-04 MED ORDER — TRIAMCINOLONE ACETONIDE 0.1 % EX OINT
1.0000 "application " | TOPICAL_OINTMENT | Freq: Two times a day (BID) | CUTANEOUS | Status: DC
  Administered 2020-12-05 – 2020-12-14 (×15): 1 via TOPICAL
  Filled 2020-12-04 (×2): qty 15

## 2020-12-04 MED ORDER — SODIUM CHLORIDE 0.9 % IV SOLN
200.0000 mg | Freq: Once | INTRAVENOUS | Status: AC
  Administered 2020-12-04: 200 mg via INTRAVENOUS
  Filled 2020-12-04: qty 40

## 2020-12-04 MED ORDER — DEXAMETHASONE 6 MG PO TABS: 6.0000 mg | ORAL_TABLET | Freq: Every day | ORAL | 0 refills | Status: DC

## 2020-12-04 MED ORDER — ALLOPURINOL 100 MG PO TABS
100.0000 mg | ORAL_TABLET | Freq: Every day | ORAL | Status: DC
  Administered 2020-12-05 – 2020-12-14 (×10): 100 mg via ORAL
  Filled 2020-12-04 (×10): qty 1

## 2020-12-04 MED ORDER — MOMETASONE FURO-FORMOTEROL FUM 200-5 MCG/ACT IN AERO
2.0000 | INHALATION_SPRAY | Freq: Two times a day (BID) | RESPIRATORY_TRACT | Status: DC
  Administered 2020-12-04 – 2020-12-14 (×20): 2 via RESPIRATORY_TRACT
  Filled 2020-12-04: qty 8.8

## 2020-12-04 MED ORDER — FLEET ENEMA 7-19 GM/118ML RE ENEM
1.0000 | ENEMA | Freq: Once | RECTAL | Status: DC | PRN
  Filled 2020-12-04: qty 1

## 2020-12-04 NOTE — H&P (Signed)
History and Physical    [name] [name] [name] DOB: 1960/07/20 DOA: 12/04/2020  PCP: Charlott Rakes, MD Consultants:  Vaughan Browner - pulmonology Patient coming from:  Home - lives with daughter and family and also his sister; NOK: Daughter, Tamiko Ellington, [phone]  Chief Complaint: Worsening COVID  HPI: [name] [name] is a 60 y.o. male with medical history significant of COPD; and HTN presenting with worsening COVID symptoms. He reports that he was exposed to his grandson, who came home from school with [place].  He developed symptoms on Wednesday or Thursday and called his PCP for Paxlovid.  However, his symptoms worsened this AM and he was hypoxic on his home O2 (4L).  He is feeling SOB with wheezing and cough.  No known fever.  No GI symptoms.  He is currently on his home 4L [place] O2 and with increased WOB but sats only as low as 92% at rest.   ED Course: Diagnosed on Wednesday after exposure, symptoms started the next day.  Has 1 day left of Paxlovid but worsening SOB.  Hypoxic on usual 4L home O2.  Given Decadron, currently on 5L and stable.  Review of Systems: As per HPI; otherwise review of systems reviewed and negative.   Ambulatory Status:  Ambulates without assistance  COVID Vaccine Status:  None  Past Medical History:  Diagnosis Date   Asthma 04/10/1997   CKD (chronic kidney disease), 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   COPD (chronic obstructive pulmonary disease) (HCC)    on 4L home O2   Essential hypertension    Gout    Thrombocytopenia ([place]) 07/28/2012    Past Surgical History:  Procedure Laterality Date   FRACTURE SURGERY  childhood   right arm   KNEE SURGERY     right knee s/p trauma    Social History   Socioeconomic History   Marital status: Legally Separated    Spouse name: Not on file   Number of children: 5    Years of education: 11   Highest education level: Not on file  Occupational History   Occupation: Unemployed   Tobacco Use   Smoking status: Former     Packs/day: 1.00    Years: 45.00    Pack years: 45.00    Types: Cigarettes    Quit date: 12/15/2013    Years since quitting: 6.9   Smokeless tobacco: Never   Tobacco comments:    'Quitting"  Vaping Use   Vaping Use: Never used  Substance and Sexual Activity   Alcohol use: Yes    Alcohol/week: 0.0 standard drinks    Comment: occaisional beer - previously one beer daily, stopped 4 months   Drug use: No    Types: Cocaine    Comment: 10/13/14: last use of cocaine one month ago    Sexual activity: Yes    Birth control/protection: Condom  Other Topics Concern   Not on file  Social History Narrative   Lives with daughter and grandkids x 2. 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    Smokes 1ppd since age 58 y.o    Drinks 2 times per week 40 oz beer. Denies liquor, wine. History of cocaine use. Denies IVDU   Has an orange card    Sexually active with 1 partner uses protection             Social Determinants of Health   Financial Resource Strain: Not on file  Food Insecurity: Not on file  Transportation Needs: Not on file  Physical Activity: Not on file  Stress: Not on file  Social Connections: Not on file  Intimate Partner Violence: Not on file    Allergies  Allergen Reactions   Atorvastatin Itching and Swelling    Facial, tongue swelling   Shellfish Allergy Anaphylaxis   Colchicine Hives    Family History  Problem Relation Age of Onset   Cancer Mother        colon    Heart disease Father    Colon cancer Other        mother ? age of diagnosis    Healthy Son    Healthy Daughter     Prior to Admission medications   Medication Sig Start Date End Date Taking? Authorizing Provider  albuterol (PROAIR HFA) 108 (90 Base) MCG/ACT inhaler INHALE 2 PUFFS INTO THE LUNGS EVERY 6 (SIX) HOURS AS NEEDED FOR WHEEZING OR SHORTNESS OF BREATH. 11/08/20 11/08/21  Mannam, Hart Robinsons, MD  albuterol (PROVENTIL) (2.5 MG/3ML)  0.083% nebulizer solution TAKE 3 MLS (2.5 MG TOTAL) BY NEBULIZATION EVERY 6 (SIX) HOURS AS NEEDED FOR SHORTNESS OF BREATH. 11/08/20 11/08/21  Mannam, Hart Robinsons, MD  allopurinol (ZYLOPRIM) 100 MG tablet TAKE 1 TABLET (100 MG TOTAL) BY MOUTH DAILY. 05/10/20 05/10/21  Charlott Rakes, MD  amLODipine (NORVASC) 10 MG tablet TAKE 1 TABLET (10 MG TOTAL) BY MOUTH DAILY. 06/08/20 06/08/21  Charlott Rakes, MD  ammonium lactate (LAC-HYDRIN) 12 % lotion Apply 1 application topically daily. 08/28/18   [provider]  budesonide-formoterol (SYMBICORT) 160-4.5 MCG/ACT inhaler INAHLE 2 PUFFS INTO THE LUNGS TWICE DAILY. 11/08/20 11/08/21  [name], MD  Cyanocobalamin (VITAMIN B-12) 1000 MCG SUBL Place 1 tablet (1,000 mcg total) under the tongue daily. 05/13/15   Narda Amber K, DO  cyclobenzaprine (FLEXERIL) 5 MG tablet Take 1 tablet (5 mg total) by mouth 2 (two) times daily as needed for muscle spasms. 10/30/18   Tegeler, Gwenyth Allegra, MD  diphenhydrAMINE (BENADRYL) 25 MG tablet Take 50 mg by mouth daily as needed (allergies).     [provider]  ezetimibe (ZETIA) 10 MG tablet TAKE 1 TABLET (10 MG TOTAL) BY MOUTH DAILY. 06/09/20 06/09/21  Charlott Rakes, MD  gabapentin (NEURONTIN) 300 MG capsule TAKE 1 CAPSULE (300 MG TOTAL) BY MOUTH AT BEDTIME. 08/06/20 08/06/21  Charlott Rakes, MD  hydrocortisone 1 % ointment Apply 1 application topically 2 (two) times daily. 12/30/18   Charlott Rakes, MD  losartan (COZAAR) 50 MG tablet TAKE 1 TABLET (50 MG TOTAL) BY MOUTH DAILY. 06/08/20 06/08/21  Charlott Rakes, MD  metoprolol succinate (TOPROL-XL) 50 MG 24 hr tablet TAKE 1 TABLET (50 MG TOTAL) BY MOUTH DAILY. 06/08/20 06/08/21  Charlott Rakes, MD  molnupiravir EUA 200 mg CAPS Take 4 capsules (800 mg total) by mouth 2 (two) times daily for 5 days. 11/30/20 12/05/20  Raspet, Derry Skill, PA-C  predniSONE (DELTASONE) 20 MG tablet Take 1 tablet (20 mg total) by mouth daily. 11/30/20   Raspet, Erin K, PA-C  tiotropium (SPIRIVA HANDIHALER) 18  MCG inhalation capsule PLACE 1 CAPSULE (18 MCG TOTAL) INTO INHALER AND INHALE DAILY. 11/08/20 11/08/21  Mannam, Hart Robinsons, MD  triamcinolone ointment (KENALOG) 0.1 % APPLY 1 APPLICATION TOPICALLY 2 TIMES DAILY. Patient taking differently: Apply 1 application topically 2 (two) times daily. 04/12/17   Charlott Rakes, MD  ALBUTEROL IN Inhale into the lungs.  07/03/11  [provider]    Physical Exam: Vitals:   12/04/20 1430 12/04/20 1500 12/04/20  1530 12/04/20 1600  BP: (!) 136/91 (!) 144/98 (!) 147/96 (!) 148/93  Pulse: 100 (!) 103 99 93  Resp: (!) 21 (!) 21 20 (!) 22  Temp:      TempSrc:      SpO2: 94% 95% (!) 89% 93%     General:  Appears calm and comfortable and is in NAD on 4L [place] O2 Eyes:  PERRL, EOMI, normal lids, iris ENT:  grossly normal hearing, lips & tongue, mmm; poor dentition Neck:  no LAD, masses or thyromegaly Cardiovascular:  RR with mild tachycardia, no m/r/g. No LE edema.  Respiratory:   Scattered rhonchi with end-respiratory wheezing.  Increased respiratory effort. Abdomen:  soft, NT, ND Back:   normal alignment, no CVAT Skin:  L > R hands with erythema and scaling c/w dyshidrotic eczema Musculoskeletal:  grossly normal tone BUE/BLE, good ROM, no bony abnormality Lower extremity:  No LE edema.  Limited foot exam with no ulcerations but significant scaling and sloughing highly suggestive of tinea.  2+ distal pulses. Psychiatric:  grossly normal mood and affect, speech fluent and appropriate, AOx3 Neurologic:  CN 2-12 grossly intact, moves all extremities in coordinated fashion    Radiological Exams on Admission: Independently reviewed - see discussion in A/P where applicable  DG Chest Port 1 View  Result Date: 12/04/2020 CLINICAL DATA:  Shortness of breath. EXAM: PORTABLE CHEST 1 VIEW COMPARISON:  CT chest 10/30/2018. FINDINGS: Normal heart size. Tortuous aorta. Diffuse chronic interstitial changes of COPD/emphysema noted. Increased interstitial opacities noted  within both lung bases, left greater than right. Upper lobes appear clear. Visualized osseous structures are unremarkable. IMPRESSION: 1. Increased interstitial opacities within both lung bases, left greater than right. Findings may represent atypical infection versus edema. 2. Emphysema. Electronically Signed   By: Kerby Moors M.D.   On: 12/04/2020 08:02    EKG: Independently reviewed.  Sinus tachycardia with rate 116; artifact  with no evidence of acute ischemia   Labs on Admission: I have personally reviewed the available labs and imaging studies at the time of the admission.  Pertinent labs:   CO2 35 Glucose 118 Albumin 2.8 AST 125/ALT 161 HS troponin 22, 22 Unremarkable CBC COVID POSITIVE LDH 271 Ferritin 1110 CRP 1.1 Procalcitonin 0.19 D-dimer 1.02 Fibrinogen 361   Assessment/Plan Principal Problem:   Acute hypoxemic respiratory failure due to COVID-19 [facility]) Active Problems:   Tobacco abuse, in remission   Hypertension   COPD without exacerbation (HCC)   Acute on chronic respiratory failure with hypoxia due to COVID-19 PNA -Patient with presenting with SOB and wheezing/cough in the setting of known COVID-19 infection -No GI symptoms -He is currently on his home 4L [place] O2 but is hypoxic with exertion and is tachypneic at rest -COVID POSITIVE -The patient has comorbidities which may increase the risk for ARDS/MODS including:  HTN, COPD -Pertinent labs concerning for COVID include normal WBC count; increased LFTs; increased LDH; markedly elevated D-dimer (>1); markedly increased ferritin; low procalcitonin; mildly elevated CRP -CXR with multifocal opacities which may be c/w COVID vs. Multifocal PNA -Will not treat with broad-spectrum antibiotics given procalcitonin <0.5 -Will admit for further evaluation, close monitoring, and treatment -Monitor on telemetry x at least 24 hours -At this time, will attempt to avoid use of aerosolized medications and use HFAs  instead -Will check daily labs including BMP with Mag, Phos; LFTs; CBC with differential; CRP; ferritin; fibrinogen; D-dimer -Will order steroids and Remdesivir (pharmacy consult) given +COVID test, +CXR, and hypoxia <94% on room air -  If the patient shows clinical deterioration, consider transfer to ICU with PCCM consultation -Consider IL-6 agonist (Actemra) and/or JAK inhibitor (baricitinib) if the patient does not stabilize on current treatment or if the patient has marked clinical decompensation; the patient does not appear to require this treatment at this time but has been consented and agrees to receive treatment if there is evidence of clinical worsening despite current treatments. -Will attempt to maintain euvolemia to a net negative fluid status -Will ask the patient to maintain an awake prone position as much as possible -PT/OT consults -Encourage mobilization/ambulation as much as possible -Patient was seen wearing full PPE including: gown, gloves, head cover, N95, and face shield; donning and doffing was in compliance with current standards.  COPD -Patient with fairly advanced COPD at baseline, on 4L [place] O2 -Continue home Symbicort (formulary substitution for Dulera), Spiriva -Supplemental O2 as needed for COVID + COPD (4L at home usually) -Patient stopped smoking about 1 year ago  Gout -Continue allopurinol  HTN -Continue Norvasc, Cozaar, Toprol XL  HLD -Hold Zetia due to limited inpatient utility -Has statin intolerance  Obesity -BMI is 37 -Weight loss should be encouraged -Outpatient PCP/bariatric medicine f/u encouraged    Level of care: Telemetry Medical DVT prophylaxis:  Lovenox  Code Status:  Full - confirmed with patient Family Communication: None present; I spoke with the patient's daughter by telephone. Disposition Plan:  The patient is from: home  Anticipated d/c is to: home without [facility] services once her respiratory issues have been resolved.  She may  require home O2 at the time of discharge.  Anticipated d/c date will depend on clinical response to treatment, likely between 3 days (with completion of outpatient Remdesivir treatment) and 5 days  Patient is currently: acutely ill Consults called: PT/OT  Admission status: Admit - It is my clinical opinion that admission to [facility] is reasonable and necessary because of the expectation that this patient will require hospital care that crosses at least 2 midnights to treat this condition based on the medical complexity of the problems presented.  Given the aforementioned information, the predictability of an adverse outcome is felt to be significant.      Karmen Bongo MD [facility]   How to contact the [facility] Attending or Consulting provider [place] or covering provider during after hours [place], for this patient?  Check the care team in [facility] and look for a) attending/consulting [facility] provider listed and b) the [facility] team listed Log into [email or web address] and use [place]'s universal password to access. If you do not have the password, please contact the hospital operator. Locate the [facility] provider you are looking for under [facility] and page to a number that you can be directly reached. If you still have difficulty reaching the provider, please page the [facility] (Director on Call) for the Hospitalists listed on amion for assistance.   12/04/2020, 5:49 PM

## 2020-12-04 NOTE — ED Notes (Signed)
Pt's walking Sa02 was 88% on 4L 02, but there wasn't a good pleth. Once pt got back into the bed the Sa02 was 86% with a good pleth.

## 2020-12-04 NOTE — Plan of Care (Signed)
  Problem: Education: Goal: Knowledge of risk factors and measures for prevention of condition will improve Outcome: Progressing   Problem: Coping: Goal: Psychosocial and spiritual needs will be supported Outcome: Progressing   Problem: Respiratory: Goal: Will maintain a patent airway Outcome: Progressing Goal: Complications related to the disease process, condition or treatment will be avoided or minimized Outcome: Progressing   Problem: Education: Goal: Knowledge of disease or condition will improve Outcome: Progressing Goal: Knowledge of the prescribed therapeutic regimen will improve Outcome: Progressing

## 2020-12-04 NOTE — ED Notes (Signed)
Pt keeps saying that his IVs are falling out, but pt is pulling them out, because both were taped down very well.

## 2020-12-04 NOTE — ED Triage Notes (Signed)
Patient complains of SOB since Thursday. On home oxygen all the time at 2L for his COPD. Patient received duoneb prior to arrival. Non-vaccinated and tested positive for Covid on Monday. Alert and oriented but appears anxious

## 2020-12-04 NOTE — Plan of Care (Signed)
  Problem: Education: Goal: Knowledge of risk factors and measures for prevention of condition will improve 12/04/2020 2240 by Maryan Puls, RN Outcome: Progressing 12/04/2020 2240 by Maryan Puls, RN Outcome: Progressing   Problem: Respiratory: Goal: Will maintain a patent airway 12/04/2020 2240 by Maryan Puls, RN Outcome: Progressing 12/04/2020 2240 by Maryan Puls, RN Outcome: Progressing Goal: Complications related to the disease process, condition or treatment will be avoided or minimized 12/04/2020 2240 by Maryan Puls, RN Outcome: Progressing 12/04/2020 2240 by Maryan Puls, RN Outcome: Progressing   Problem: Education: Goal: Knowledge of disease or condition will improve Outcome: Progressing Goal: Knowledge of the prescribed therapeutic regimen will improve Outcome: Progressing

## 2020-12-04 NOTE — ED Provider Notes (Signed)
[facility] EMERGENCY DEPARTMENT Provider Note   CSN: [id number] Arrival date & time: 12/04/20  [id number]     History No chief complaint on file.   [name] [name] is a 60 y.o. male.  Patient is a 60 yo male presenting for sob. Patient states his grandson tested positive for covid 19 virus last weekend prompting him to get tested without symptoms. Pt tested positive on Wednesday 12/01/2020, developed symptoms of cough and sob on Thursday 12/02/2020, and followed with pcp and started on Paxlovid. Patient states he only has one more day/dose left. Admits to worsening sob, wheezing, and cough. Denies fevers, chills, nausea, vomiting, or diarrhea. On 4 L [place] at baseline. Hx of COPD.   The history is provided by the patient. No language interpreter was used.  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   COPD (chronic obstructive pulmonary disease) ([place]) 12/15/2013   Essential hypertension    Gout    Influenza with respiratory manifestations 07/30/2012   Pneumonia    Thrombocytopenia ([place]) 07/28/2012   Tobacco abuse 07/29/2012    Patient Active Problem List   Diagnosis Date Noted   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   COPD without exacerbation ([place]) 10/30/2016   Chronic respiratory failure with hypoxia ([place]) 10/30/2016   Obesity (BMI 30.0-34.9) 09/17/2015   Bilateral carpal tunnel syndrome 09/09/2015   Ulnar neuropathy of both upper extremities 09/09/2015   Numbness of left hand 01/21/2015   Abnormal EKG 10/27/2014   Moderate to severe pulmonary hypertension ([place]) 12/14/2013   Cocaine abuse in remission ([place]) 12/11/2013    Hypertension 12/11/2013   COPD bronchitis 08/08/2012   Gout 08/08/2012   CKD (chronic kidney disease) stage 3, GFR 30-59 ml/min ([facility]) 08/03/2012   Tobacco abuse, in remission 07/29/2012    Past Surgical History:  Procedure Laterality Date   FRACTURE SURGERY  childhood   right arm   KNEE SURGERY     right knee s/p trauma       Family History  Problem Relation Age of Onset   Cancer Mother        colon    Heart disease Father    Colon cancer Other        mother ? age of diagnosis    Healthy Son    Healthy Daughter     Social History   Tobacco Use   Smoking status: Former    Packs/day: 1.00    Years: 45.00    Pack years: 45.00    Types: Cigarettes    Quit date: 12/15/2013    Years since quitting: 6.9   Smokeless tobacco: Never   Tobacco comments:    'Quitting"  Vaping Use   Vaping Use: Never used  Substance Use Topics   Alcohol use: Yes    Alcohol/week: 0.0 standard drinks    Comment: occaisional beer - previously one beer daily, stopped 4 months   Drug use: No    Types: Cocaine    Comment: 10/13/14: last use of cocaine one month ago     Home Medications Prior to Admission medications   Medication Sig Start Date End Date Taking?  Authorizing Provider  albuterol (PROAIR HFA) 108 (90 Base) MCG/ACT inhaler INHALE 2 PUFFS INTO THE LUNGS EVERY 6 (SIX) HOURS AS NEEDED FOR WHEEZING OR SHORTNESS OF BREATH. 11/08/20 11/08/21  Mannam, Hart Robinsons, MD  albuterol (PROVENTIL) (2.5 MG/3ML) 0.083% nebulizer solution TAKE 3 MLS (2.5 MG TOTAL) BY NEBULIZATION EVERY 6 (SIX) HOURS AS NEEDED FOR SHORTNESS OF BREATH. 11/08/20 11/08/21  Mannam, Hart Robinsons, MD  allopurinol (ZYLOPRIM) 100 MG tablet TAKE 1 TABLET (100 MG TOTAL) BY MOUTH DAILY. 05/10/20 05/10/21  Charlott Rakes, MD  amLODipine (NORVASC) 10 MG tablet TAKE 1 TABLET (10 MG TOTAL) BY MOUTH DAILY. 06/08/20 06/08/21  Charlott Rakes, MD  ammonium lactate (LAC-HYDRIN) 12 % lotion Apply 1 application topically daily. 08/28/18   [provider]   budesonide-formoterol (SYMBICORT) 160-4.5 MCG/ACT inhaler INAHLE 2 PUFFS INTO THE LUNGS TWICE DAILY. 11/08/20 11/08/21  [name], MD  Cyanocobalamin (VITAMIN B-12) 1000 MCG SUBL Place 1 tablet (1,000 mcg total) under the tongue daily. 05/13/15   Narda Amber K, DO  cyclobenzaprine (FLEXERIL) 5 MG tablet Take 1 tablet (5 mg total) by mouth 2 (two) times daily as needed for muscle spasms. 10/30/18   Tegeler, Gwenyth Allegra, MD  diphenhydrAMINE (BENADRYL) 25 MG tablet Take 50 mg by mouth daily as needed (allergies).     [provider]  ezetimibe (ZETIA) 10 MG tablet TAKE 1 TABLET (10 MG TOTAL) BY MOUTH DAILY. 06/09/20 06/09/21  Charlott Rakes, MD  gabapentin (NEURONTIN) 300 MG capsule TAKE 1 CAPSULE (300 MG TOTAL) BY MOUTH AT BEDTIME. 08/06/20 08/06/21  Charlott Rakes, MD  hydrocortisone 1 % ointment Apply 1 application topically 2 (two) times daily. 12/30/18   Charlott Rakes, MD  losartan (COZAAR) 50 MG tablet TAKE 1 TABLET (50 MG TOTAL) BY MOUTH DAILY. 06/08/20 06/08/21  Charlott Rakes, MD  metoprolol succinate (TOPROL-XL) 50 MG 24 hr tablet TAKE 1 TABLET (50 MG TOTAL) BY MOUTH DAILY. 06/08/20 06/08/21  Charlott Rakes, MD  molnupiravir EUA 200 mg CAPS Take 4 capsules (800 mg total) by mouth 2 (two) times daily for 5 days. 11/30/20 12/05/20  Raspet, Derry Skill, PA-C  predniSONE (DELTASONE) 20 MG tablet Take 1 tablet (20 mg total) by mouth daily. 11/30/20   Raspet, Erin K, PA-C  tiotropium (SPIRIVA HANDIHALER) 18 MCG inhalation capsule PLACE 1 CAPSULE (18 MCG TOTAL) INTO INHALER AND INHALE DAILY. 11/08/20 11/08/21  Mannam, Hart Robinsons, MD  triamcinolone ointment (KENALOG) 0.1 % APPLY 1 APPLICATION TOPICALLY 2 TIMES DAILY. Patient taking differently: Apply 1 application topically 2 (two) times daily. 04/12/17   Charlott Rakes, MD  ALBUTEROL IN Inhale into the lungs.  07/03/11  [provider]    Allergies    Atorvastatin, Shellfish allergy, and Colchicine  Review of Systems   Review of Systems   Constitutional:  Negative for chills and fever.  HENT:  Negative for ear pain and sore throat.   Eyes:  Negative for pain and visual disturbance.  Respiratory:  Positive for cough, shortness of breath and wheezing.   Cardiovascular:  Negative for chest pain and palpitations.  Gastrointestinal:  Negative for abdominal pain and vomiting.  Genitourinary:  Negative for dysuria and hematuria.  Musculoskeletal:  Negative for arthralgias and back pain.  Skin:  Negative for color change and rash.  Neurological:  Negative for seizures and syncope.  All other systems reviewed and are negative.  Physical Exam Updated Vital Signs BP (!) 146/87   Pulse (!) 111   Temp 100.3 F (37.9 C) (Oral)   Resp 15   SpO2  95%   Physical Exam Vitals and nursing note reviewed.  Constitutional:      Appearance: He is well-developed.  HENT:     Head: Normocephalic and atraumatic.  Eyes:     Conjunctiva/sclera: Conjunctivae normal.  Cardiovascular:     Rate and Rhythm: Normal rate and regular rhythm.     Heart sounds: No murmur heard. Pulmonary:     Effort: Respiratory distress present.     Breath sounds: Normal breath sounds.  Abdominal:     Palpations: Abdomen is soft.     Tenderness: There is no abdominal tenderness.  Musculoskeletal:     Cervical back: Neck supple.  Skin:    General: Skin is warm and dry.  Neurological:     Mental Status: He is alert.    ED Results / Procedures / Treatments   Labs (all labs ordered are listed, but only abnormal results are displayed) Labs Reviewed  CBC - Abnormal; Notable for the following components:      Result Value   MCV 79.4 (*)    MCH 23.7 (*)    MCHC 29.8 (*)    All other components within normal limits  RESP PANEL BY RT-PCR (FLU A&B, COVID) [id number]  BASIC METABOLIC PANEL  TROPONIN I (HIGH SENSITIVITY)    EKG None  Radiology DG Chest Port 1 View  Result Date: 12/04/2020 CLINICAL DATA:  Shortness of breath. EXAM: PORTABLE CHEST 1 VIEW  COMPARISON:  CT chest 10/30/2018. FINDINGS: Normal heart size. Tortuous aorta. Diffuse chronic interstitial changes of COPD/emphysema noted. Increased interstitial opacities noted within both lung bases, left greater than right. Upper lobes appear clear. Visualized osseous structures are unremarkable. IMPRESSION: 1. Increased interstitial opacities within both lung bases, left greater than right. Findings may represent atypical infection versus edema. 2. Emphysema. Electronically Signed   By: Kerby Moors M.D.   On: 12/04/2020 08:02    Procedures .Critical Care  Date/Time: 12/05/2020 3:29 PM Performed by: Lianne Cure, DO Authorized by: Lianne Cure, DO   Critical care provider statement:    Critical care time (minutes):  40   Critical care was necessary to treat or prevent imminent or life-threatening deterioration of the following conditions:  Respiratory failure   Critical care was time spent personally by me on the following activities:  Development of treatment plan with patient or surrogate, discussions with primary provider, examination of patient, ordering and performing treatments and interventions, ordering and review of laboratory studies, ordering and review of radiographic studies, pulse oximetry and re-evaluation of patient's condition   I assumed direction of critical care for this patient from another provider in my specialty: yes   Comments:     Respiratory failure with hypoxia   Medications Ordered in ED Medications  acetaminophen (TYLENOL) tablet 650 mg (has no administration in time range)    ED Course  I have reviewed the triage vital signs and the nursing notes.  Pertinent labs & imaging results that were available during my care of the patient were reviewed by me and considered in my medical decision making (see chart for details).    MDM Rules/Calculators/A&P                           10:44 AM 60 yo male presenting for sob and cough post dx of covid 19  virus. Please see HPI for details.    -Ambulatory pulse ox 86% on home 4 L [place] and 89% while resting. Albuterol  and dexamethasone given.  -Stable EKG with no ST segment elevation or depression. Stable rate and intervals. Troponin elevated likely secondary to ischemic demand. Repeat troponin ordered and pending.  -CXR demonstrates: 1. Increased interstitial opacities within both lung bases, left greater than right. Findings may represent atypical infection versus edema. 2. Emphysema.   Recommending admission for hypoxia and pneumonia secondary to covid-19 virus. Patient agreeable to plan. I spoke with hospitalist who agrees to accept patient.    Final Clinical Impression(s) / ED Diagnoses Final diagnoses:  SOB (shortness of breath)  COVID  Pneumonia due to COVID-19 virus  Acute respiratory failure with hypoxia [facility])    Rx / DC Orders ED Discharge Orders     None        Lianne Cure, DO [id number]

## 2020-12-05 LAB — FERRITIN: Ferritin: 1360 ng/mL — ABNORMAL HIGH (ref 24–336)

## 2020-12-05 LAB — CBC WITH DIFFERENTIAL/PLATELET
Abs Immature Granulocytes: 0.02 10*3/uL (ref 0.00–0.07)
Basophils Absolute: 0 10*3/uL (ref 0.0–0.1)
Basophils Relative: 0 %
Eosinophils Absolute: 0 10*3/uL (ref 0.0–0.5)
Eosinophils Relative: 0 %
HCT: 45.1 % (ref 39.0–52.0)
Hemoglobin: 13.3 g/dL (ref 13.0–17.0)
Immature Granulocytes: 0 %
Lymphocytes Relative: 25 %
Lymphs Abs: 1.2 10*3/uL (ref 0.7–4.0)
MCH: 23.5 pg — ABNORMAL LOW (ref 26.0–34.0)
MCHC: 29.5 g/dL — ABNORMAL LOW (ref 30.0–36.0)
MCV: 79.5 fL — ABNORMAL LOW (ref 80.0–100.0)
Monocytes Absolute: 0.7 10*3/uL (ref 0.1–1.0)
Monocytes Relative: 16 %
Neutro Abs: 2.8 10*3/uL (ref 1.7–7.7)
Neutrophils Relative %: 59 %
Platelets: 165 10*3/uL (ref 150–400)
RBC: 5.67 MIL/uL (ref 4.22–5.81)
RDW: 13 % (ref 11.5–15.5)
WBC: 4.7 10*3/uL (ref 4.0–10.5)
nRBC: 0 % (ref 0.0–0.2)

## 2020-12-05 LAB — D-DIMER, QUANTITATIVE: D-Dimer, Quant: 0.69 ug/mL-FEU — ABNORMAL HIGH (ref 0.00–0.50)

## 2020-12-05 LAB — COMPREHENSIVE METABOLIC PANEL
ALT: 141 U/L — ABNORMAL HIGH (ref 0–44)
AST: 103 U/L — ABNORMAL HIGH (ref 15–41)
Albumin: 2.8 g/dL — ABNORMAL LOW (ref 3.5–5.0)
Alkaline Phosphatase: 64 U/L (ref 38–126)
Anion gap: 8 (ref 5–15)
BUN: 15 mg/dL (ref 8–23)
CO2: 34 mmol/L — ABNORMAL HIGH (ref 22–32)
Calcium: 8.6 mg/dL — ABNORMAL LOW (ref 8.9–10.3)
Chloride: 93 mmol/L — ABNORMAL LOW (ref 98–111)
Creatinine, Ser: 1.16 mg/dL (ref 0.61–1.24)
GFR, Estimated: >60 mL/min (ref >60)
Glucose, Bld: 116 mg/dL — ABNORMAL HIGH (ref 70–99)
Potassium: 4.3 mmol/L (ref 3.5–5.1)
Sodium: 135 mmol/L (ref 135–145)
Total Bilirubin: 1 mg/dL (ref 0.3–1.2)
Total Protein: 6.9 g/dL (ref 6.5–8.1)

## 2020-12-05 LAB — PHOSPHORUS: Phosphorus: 3.4 mg/dL (ref 2.5–4.6)

## 2020-12-05 LAB — C-REACTIVE PROTEIN: CRP: 1.5 mg/dL — ABNORMAL HIGH (ref <1.0)

## 2020-12-05 LAB — MAGNESIUM: Magnesium: 1.9 mg/dL (ref 1.7–2.4)

## 2020-12-05 NOTE — Evaluation (Signed)
Physical Therapy Evaluation Patient Details Name: Gardner Hebel MRN: [id number] DOB: 1961/02/16 Today's Date: 12/05/2020   History of Present Illness  60 y.o. male presents to [facility] ED on 12/04/2020 with worsening COVID symptoms, including hypoxia, SOB, and cough. PMH includes COPD and HTN.  Clinical Impression  Pt presents to PT with deficits in activity tolerance, strength, power, and cardiopulmonary function. Pt with one instance of knee buckling when ambulating, otherwise no balance or gait deviations observed. Pt's activity tolerance is greatly limited, with increased supplemental oxygen needs and significant DOE. Pt will benefit from continued acute PT services to improve activity tolerance and restore independence. PT recommends no PT services at the time of discharge pending continued progress in the acute setting.    Follow Up Recommendations No PT follow up    Equipment Recommendations   (TBD)    Recommendations for Other Services       Precautions / Restrictions Precautions Precautions: Fall;Other (comment) Precaution Comments: pt wears 4L O2 at baseline Restrictions Weight Bearing Restrictions: No      Mobility  Bed Mobility Overal bed mobility: Needs Assistance Bed Mobility: Sidelying to Sit   Sidelying to sit: Supervision       General bed mobility comments: use of rails    Transfers Overall transfer level: Independent Equipment used: Rolling walker (2 wheeled) Transfers: Sit to/from Stand Sit to Stand: Min guard            Ambulation/Gait Ambulation/Gait assistance: Scientist, forensic (Feet): 70 Feet Assistive device: None Gait Pattern/deviations: Step-through pattern Gait velocity: reduced Gait velocity interpretation: 1.31 - 2.62 ft/sec, indicative of limited community ambulator General Gait Details: pt with one instance of mild knee buckling, able to self-correct  Stairs            Wheelchair Mobility    Modified Rankin (Stroke  Patients Only)       Balance Overall balance assessment: Needs assistance Sitting-balance support: No upper extremity supported;Feet supported Sitting balance-Leahy Scale: Good     Standing balance support: No upper extremity supported Standing balance-Leahy Scale: Good Standing balance comment: poor activity tolerance; leaning over at sink                             Pertinent Vitals/Pain Pain Assessment: No/denies pain Pain Score: 2  Pain Location: BLEs Pain Descriptors / Indicators: Other (Comment) (stiff) Pain Intervention(s): Monitored during session;Repositioned    Home Living Family/patient expects to be discharged to:: Private residence Living Arrangements: Other relatives Available Help at Discharge: Family;Friend(s);Available 24 hours/day Type of Home: House Home Access: Level entry     Home Layout: One level Home Equipment: None      Prior Function Level of Independence: Independent         Comments: use of O2 at baseline; pt reports performing own ADL and mobility without physical assist.     Hand Dominance   Dominant Hand: Right    Extremity/Trunk Assessment   Upper Extremity Assessment Upper Extremity Assessment: Overall WFL for tasks assessed    Lower Extremity Assessment Lower Extremity Assessment: Generalized weakness    Cervical / Trunk Assessment Cervical / Trunk Assessment: Normal  Communication   Communication: No difficulties  Cognition Arousal/Alertness: Awake/alert Behavior During Therapy: WFL for tasks assessed/performed Overall Cognitive Status: Within Functional Limits for tasks assessed  General Comments General comments (skin integrity, edema, etc.): pt on 8LNC upon arrival, sats ranging from 84-93% during session. Work of breathing does increase with mobility, pt reporting DOE.    Exercises     Assessment/Plan    PT Assessment Patient needs  continued PT services  PT Problem List Decreased activity tolerance;Decreased balance;Cardiopulmonary status limiting activity;Decreased strength       PT Treatment Interventions Gait training;Stair training;Functional mobility training;Therapeutic activities;Therapeutic exercise;Balance training;Patient/family education    PT Goals (Current goals can be found in the Care Plan section)  Acute Rehab PT Goals Patient Stated Goal: to return to independence and prior level of activity tolerance PT Goal Formulation: With patient Time For Goal Achievement: 12/19/20 Potential to Achieve Goals: Good Additional Goals Additional Goal #1: Pt will ambulate for >100' with DOE of less than 3/10 to demonstrate improvements in activity tolerance    Frequency Min 3X/week   Barriers to discharge        Co-evaluation               AM-PAC PT "6 Clicks" Mobility  Outcome Measure Help needed turning from your back to your side while in a flat bed without using bedrails?: None Help needed moving from lying on your back to sitting on the side of a flat bed without using bedrails?: None Help needed moving to and from a bed to a chair (including a wheelchair)?: None Help needed standing up from a chair using your arms (e.g., wheelchair or bedside chair)?: None Help needed to walk in hospital room?: A Little Help needed climbing 3-5 steps with a railing? : A Little 6 Click Score: 22    End of Session Equipment Utilized During Treatment: Oxygen Activity Tolerance: Patient limited by fatigue Patient left: in bed;with call bell/phone within reach Nurse Communication: Mobility status PT Visit Diagnosis: Other abnormalities of gait and mobility (R26.89)    Time: [id number] PT Time Calculation (min) (ACUTE ONLY): 16 min   Charges:   PT Evaluation $PT Eval Low Complexity: 1 Low          Zenaida Niece, PT, DPT Acute Rehabilitation Pager: [place] 12/05/2020, 3:40 PM

## 2020-12-05 NOTE — Progress Notes (Addendum)
PROGRESS NOTE    [name] [name]  [id number] DOB: [birth year] DOA: 12/04/2020 PCP: Charlott Rakes, MD   Chief Complain: Worsening COVID   Brief Narrative:   Patient is a 60 year old male with history of COPD on oxygen at home at 4 L/min on, hypertension who presented with worsening COVID symptoms.  He was recently exposed to his grandson who had [place].  He called his PCP and was taking Paxilovid but that did not help.  So he presented to the emergency department.  Assessment & Plan:   Principal Problem:   Acute hypoxemic respiratory failure due to COVID-19 [facility]) Active Problems:   Tobacco abuse, in remission   Hypertension   COPD without exacerbation (HCC)   Acute on chronic hypoxic respiratory failure due to COVID pneumonia: Presented with shortness of breath, wheezing, cough.  Recently exposed to [place], taking Paxilovid.  Patient labs showed elevated LFTs, elevated LDH, elevated D-dimer, ferritin.  CXR  showed multiple opacities consistent with COVID-pneumonia.  Procalcitonin is nonreassuring.  Started on steroids, remdesivir. We  Will consider Actemra if no improvement.PT/OT consulted He is saturating around 90% on 7 L of oxygen per minute. We recommend frequent pronation as tolerated.  History of COPD: Fairly advanced.  On 4 L of oxygen at home.  Continue home Symbicort, Spiriva.  Continue incentive spirometer, flutter valve.  Patient is a past smoker, quit about a year ago  Gout: On allopurinol  Hypertension: On amlodipine, losartan, Toprol  Hyperlipidemia: On Zetia at home  Obesity: BMI of  37         DVT prophylaxis:Lovenox Code Status: Full Family Communication: None at bedside Status is: Inpatient  Remains inpatient appropriate because:Inpatient level of care appropriate due to severity of illness  Dispo: The patient is from: Home              Anticipated d/c is to: Home              Patient currently is not medically stable to d/c.   Difficult to place  patient No      Consultants: None  Procedures:None  Antimicrobials:  Anti-infectives (From admission, onward)    Start     Dose/Rate Route Frequency Ordered Stop   12/05/20 1000  remdesivir 100 mg in sodium chloride 0.9 % 100 mL IVPB       See Hyperspace for full Linked Orders Report.   100 mg 200 mL/hr over 30 Minutes Intravenous Daily 12/04/20 1248 12/09/20 0959   12/04/20 1500  remdesivir 200 mg in sodium chloride 0.9% 250 mL IVPB       See Hyperspace for full Linked Orders Report.   200 mg 580 mL/hr over 30 Minutes Intravenous Once 12/04/20 1248 12/04/20 1556       Subjective:  Patient seen and examined the bedside this morning.  Lying on the bed.  Saturation was in the range of 87% on 4 L.  Oxygen delivery increased to 7 L, he then maintaining saturation around 92%.  Complains of some cough, very anxious.  Looks overall comfortable.  Denies any other problems   Objective: Vitals:   12/04/20 1953 12/04/20 2047 12/04/20 2322 12/05/20 0403  BP: (!) 147/93  (!) 136/93 (!) 170/103  Pulse: (!) 101     Resp: 20   (!) 22  Temp: 98.6 F (37 C)  98.6 F (37 C) 98.5 F (36.9 C)  TempSrc: Oral  Oral Oral  SpO2: 97%  (!) 88% (!) 88%  Weight: 104.3 kg 105.5 kg  Height: '5\' 7"'$  (1.702 m) '5\' 7"'$  (1.702 m)      Intake/Output Summary (Last 24 hours) at 12/05/2020 0751 Last data filed at 12/05/2020 [id number] Gross per 24 hour  Intake --  Output 1700 ml  Net -1700 ml   Filed Weights   12/04/20 1953 12/04/20 2047  Weight: 104.3 kg 105.5 kg    Examination:  General exam:  not in distress, overly obese HEENT: PERRL Respiratory system: Bilateral scattered rhonchi, scattered wheezing Cardiovascular system: S1 & S2 heard, RRR.  Gastrointestinal system: Abdomen is nondistended, soft and nontender. Central nervous system: Alert and oriented Extremities: No edema, no clubbing ,no cyanosis Skin: No rashes, no ulcers,no icterus      Data Reviewed: I have personally reviewed  following labs and imaging studies  CBC: Recent Labs  Lab 12/04/20 0731 12/05/20 0405  WBC 8.0 4.7  NEUTROABS  --  2.8  HGB 13.7 13.3  HCT 46.0 45.1  MCV 79.4* 79.5*  PLT 166 [id number]   Basic Metabolic Panel: Recent Labs  Lab 12/04/20 0731 12/05/20 0405  NA 135 135  K 3.5 4.3  CL 92* 93*  CO2 35* 34*  GLUCOSE 118* 116*  BUN 10 15  CREATININE 1.17 1.16  CALCIUM 8.4* 8.6*  MG  --  1.9  PHOS  --  3.4   GFR: Estimated Creatinine Clearance: 77.5 mL/min (by C-G formula based on SCr of 1.16 mg/dL). Liver Function Tests: Recent Labs  Lab 12/04/20 1402 12/05/20 0405  AST 125* 103*  ALT 161* 141*  ALKPHOS 64 64  BILITOT 1.1 1.0  PROT 6.8 6.9  ALBUMIN 2.8* 2.8*   No results for input(s): LIPASE, AMYLASE in the last 168 hours. No results for input(s): AMMONIA in the last 168 hours. Coagulation Profile: No results for input(s): INR, PROTIME in the last 168 hours. Cardiac Enzymes: No results for input(s): CKTOTAL, CKMB, CKMBINDEX, TROPONINI in the last 168 hours. BNP (last 3 results) No results for input(s): PROBNP in the last 8760 hours. HbA1C: No results for input(s): HGBA1C in the last 72 hours. CBG: No results for input(s): GLUCAP in the last 168 hours. Lipid Profile: No results for input(s): CHOL, HDL, LDLCALC, TRIG, CHOLHDL, LDLDIRECT in the last 72 hours. Thyroid Function Tests: No results for input(s): TSH, T4TOTAL, FREET4, T3FREE, THYROIDAB in the last 72 hours. Anemia Panel: Recent Labs    12/04/20 1402 12/05/20 0405  FERRITIN 1,110* 1,360*   Sepsis Labs: Recent Labs  Lab 12/04/20 1402  PROCALCITON 0.19    Recent Results (from the past 240 hour(s))  Resp Panel by RT-PCR (Flu A&B, Covid) Nasopharyngeal Swab     Status: Abnormal   Collection Time: 12/04/20  7:55 AM   Specimen: Nasopharyngeal Swab; Nasopharyngeal(NP) swabs in vial transport medium  Result Value Ref Range Status   SARS Coronavirus 2 by RT PCR POSITIVE (A) NEGATIVE Final    Comment:  RESULT CALLED TO, READ BACK BY AND VERIFIED WITH: RN E.BANKS ON [id number] AT 1045 BY E.PARRISH (NOTE) SARS-CoV-2 target nucleic acids are DETECTED.  The SARS-CoV-2 RNA is generally detectable in upper respiratory specimens during the acute phase of infection. Positive results are indicative of the presence of the identified virus, but do not rule out bacterial infection or co-infection with other pathogens not detected by the test. Clinical correlation with patient history and other diagnostic information is necessary to determine patient infection status. The expected result is Negative.  Fact Sheet for Patients: EntrepreneurPulse.com.au  Fact Sheet for Healthcare Providers: IncredibleEmployment.be  This  test is not yet approved or cleared by the [place] and  has been authorized for detection and/or diagnosis of SARS-CoV-2 by FDA under an Emergency Use Authorization (EUA).  This EUA will remain in effect (meaning this t est can be used) for the duration of  the COVID-19 declaration under Section 564(b)(1) of the Act, 21 U.S.C. section 360bbb-3(b)(1), unless the authorization is terminated or revoked sooner.     Influenza A by PCR NEGATIVE NEGATIVE Final   Influenza B by PCR NEGATIVE NEGATIVE Final    Comment: (NOTE) The Xpert Xpress SARS-CoV-2/FLU/RSV plus assay is intended as an aid in the diagnosis of influenza from Nasopharyngeal swab specimens and should not be used as a sole basis for treatment. Nasal washings and aspirates are unacceptable for Xpert Xpress SARS-CoV-2/FLU/RSV testing.  Fact Sheet for Patients: EntrepreneurPulse.com.au  Fact Sheet for Healthcare Providers: IncredibleEmployment.be  This test is not yet approved or cleared by the [place] FDA and has been authorized for detection and/or diagnosis of SARS-CoV-2 by FDA under an Emergency Use Authorization (EUA). This EUA  will remain in effect (meaning this test can be used) for the duration of the COVID-19 declaration under Section 564(b)(1) of the Act, 21 U.S.C. section 360bbb-3(b)(1), unless the authorization is terminated or revoked.  Performed at [facility], [place] [address] [place], [place] [address]          Radiology Studies: DG Chest Port 1 View  Result Date: 12/04/2020 CLINICAL DATA:  Shortness of breath. EXAM: PORTABLE CHEST 1 VIEW COMPARISON:  CT chest 10/30/2018. FINDINGS: Normal heart size. Tortuous aorta. Diffuse chronic interstitial changes of COPD/emphysema noted. Increased interstitial opacities noted within both lung bases, left greater than right. Upper lobes appear clear. Visualized osseous structures are unremarkable. IMPRESSION: 1. Increased interstitial opacities within both lung bases, left greater than right. Findings may represent atypical infection versus edema. 2. Emphysema. Electronically Signed   By: Kerby Moors M.D.   On: 12/04/2020 08:02        Scheduled Meds:  allopurinol  100 mg Oral Daily   amLODipine  10 mg Oral Daily   vitamin C  500 mg Oral Daily   docusate sodium  100 mg Oral BID   enoxaparin (LOVENOX) injection  40 mg Subcutaneous Q24H   gabapentin  300 mg Oral QHS   losartan  50 mg Oral Daily   methylPREDNISolone (SOLU-MEDROL) injection  50 mg Intravenous Q12H   Followed by   Derrill Memo ON 12/08/2020] predniSONE  50 mg Oral Daily   metoprolol succinate  50 mg Oral Daily   mometasone-formoterol  2 puff Inhalation BID   sodium chloride flush  3 mL Intravenous Q12H   sodium chloride flush  3 mL Intravenous Q12H   terbinafine   Topical Daily   triamcinolone ointment  1 application Topical BID   umeclidinium bromide  1 puff Inhalation Daily   zinc sulfate  220 mg Oral Daily   Continuous Infusions:  sodium chloride     remdesivir 100 mg in NS 100 mL       LOS: 1 day    Time spent: 35 mins,More than 50% of that time was spent in counseling  and/or coordination of care.      Shelly Coss, MD [facility] P8/28/2022, 7:51 AM

## 2020-12-05 NOTE — Evaluation (Signed)
Occupational Therapy Evaluation Patient Details Name: [name] [name] MRN: [id number] DOB: 1961-03-03 Today's Date: 12/05/2020    History of Present Illness 60 y.o. male presents to [facility] ED on 12/04/2020 with worsening COVID symptoms, including hypoxia, SOB, and cough. PMH includes COPD and HTN.   Clinical Impression   Pt PTA: Pt living with relatives and reports 24/7  supervision at home. Pt was performing own ADL until SOB increased. Pt currently, O2 sats desat to 86% when propped on elbows leaning over counter for ADL; cues to stand upright or to sit down and pt staying in place like so. Pt education for energy conservation techniques and reports to be using some strategies at home. Pt minA overall for ADL and minguardA for mobility a short distance taking a few steps from bed to side of counter. Pt would benefit from continued OT skilled services. OT following acutely.    Follow Up Recommendations  Home health OT;Supervision - Intermittent    Equipment Recommendations  None recommended by OT    Recommendations for Other Services       Precautions / Restrictions Precautions Precautions: Fall;Other (comment) Precaution Comments: chronic use of O2 Restrictions Weight Bearing Restrictions: No      Mobility Bed Mobility Overal bed mobility: Needs Assistance Bed Mobility: Sidelying to Sit   Sidelying to sit: Supervision       General bed mobility comments: use of rails    Transfers Overall transfer level: Needs assistance Equipment used: Rolling walker (2 wheeled) Transfers: Sit to/from Stand Sit to Stand: Min guard              Balance Overall balance assessment: Needs assistance Sitting-balance support: Feet supported;No upper extremity supported Sitting balance-Leahy Scale: Fair       Standing balance-Leahy Scale: Poor Standing balance comment: poor activity tolerance; leaning over at sink                           ADL either performed or assessed  with clinical judgement   ADL Overall ADL's : Needs assistance/impaired Eating/Feeding: Set up;Sitting   Grooming: Supervision/safety;Standing Grooming Details (indicate cue type and reason): leaning on elbows on counter. "this is how I usually do it." Upper Body Bathing: Set up;Sitting   Lower Body Bathing: Moderate assistance;Sitting/lateral leans;Sit to/from stand;Cueing for safety   Upper Body Dressing : Set up;Sitting   Lower Body Dressing: Moderate assistance;Sitting/lateral leans;Sit to/from stand;Cueing for safety   Toilet Transfer: Min guard;RW   Toileting- Clothing Manipulation and Hygiene: Moderate assistance;Sitting/lateral lean;Sit to/from stand       Functional mobility during ADLs: Min guard;Rolling walker General ADL Comments: Pt taking a few steps from bed to sink and leaning on sink counter for light ADL tasks. O2 ranging  >86%-93% on 8L O2. Pt reports "sometimes I hold my breath." Pt encouraged to perform pursed lip breathing tasks. Pt education provided for energy conservation tasks.     Vision Baseline Vision/History: 0 No visual deficits Ability to See in Adequate Light: 0 Adequate Patient Visual Report: No change from baseline Vision Assessment?: No apparent visual deficits     Perception     Praxis      Pertinent Vitals/Pain Pain Assessment: 0-10 Pain Score: 2  Pain Location: BLEs Pain Descriptors / Indicators: Other (Comment) (stiff) Pain Intervention(s): Monitored during session;Repositioned     Hand Dominance Right   Extremity/Trunk Assessment Upper Extremity Assessment Upper Extremity Assessment: Overall WFL for tasks assessed   Lower Extremity Assessment  Lower Extremity Assessment: Generalized weakness   Cervical / Trunk Assessment Cervical / Trunk Assessment: Normal   Communication Communication Communication: No difficulties   Cognition Arousal/Alertness: Awake/alert Behavior During Therapy: WFL for tasks  assessed/performed Overall Cognitive Status: Within Functional Limits for tasks assessed                                     General Comments  O2 on 7-8L O2 >86%-95% with exertion at EOB and in standing while propped on elbows.    Exercises     Shoulder Instructions      Home Living Family/patient expects to be discharged to:: Private residence Living Arrangements: Other relatives Available Help at Discharge: Family;Friend(s);Available 24 hours/day               Bathroom Shower/Tub: Teacher, early years/pre: Standard                Prior Functioning/Environment Level of Independence: Independent        Comments: use of O2 at baseline; pt reports performing own ADL and mobility without physical assist.        OT Problem List: Decreased activity tolerance;Decreased safety awareness;Cardiopulmonary status limiting activity      OT Treatment/Interventions: Self-care/ADL training;Therapeutic activities;Patient/family education;DME and/or AE instruction;Energy conservation    OT Goals(Current goals can be found in the care plan section) Acute Rehab OT Goals Patient Stated Goal: to go home OT Goal Formulation: With patient Time For Goal Achievement: 12/19/20 Potential to Achieve Goals: Good ADL Goals Additional ADL Goal #1: pt will perform x6 mins of OOB ADL with encouragement to stand upright or sit for tasks with O2 sats  >90%, Additional ADL Goal #2: Pt will state 4 energy conservation strategies in order to increase ability activity tolerance.  OT Frequency: Min 2X/week   Barriers to D/C:            Co-evaluation              AM-PAC OT "6 Clicks" Daily Activity     Outcome Measure Help from another person eating meals?: None Help from another person taking care of personal grooming?: None Help from another person toileting, which includes using toliet, bedpan, or urinal?: None Help from another person bathing (including  washing, rinsing, drying)?: A Little Help from another person to put on and taking off regular upper body clothing?: None Help from another person to put on and taking off regular lower body clothing?: A Little 6 Click Score: 22   End of Session Equipment Utilized During Treatment: Rolling walker;Oxygen Nurse Communication: Mobility status;Other (comment) (pt neeedd medication for mucus, RN notified)  Activity Tolerance: Patient limited by fatigue Patient left: in bed;with call bell/phone within reach;with bed alarm set  OT Visit Diagnosis: Unsteadiness on feet (R26.81);Muscle weakness (generalized) (M62.81)                Time: [id number] OT Time Calculation (min): 24 min Charges:  OT General Charges $OT Visit: 1 Visit OT Evaluation $OT Eval Moderate Complexity: 1 Mod OT Treatments $Self Care/Home Management : 8-22 mins  Jefferey Pica, OTR/L Acute Rehabilitation Services Pager: [phone] Office: [place] 12/05/2020, 2:51 PM

## 2020-12-05 NOTE — Progress Notes (Signed)
Patient could really benefit from expectorant. He indicates that he was taking muncinex at home. He has a congested non-productive cough. He also states that he is unrelieved with the albuterol inhaler compared to the nebulizer.

## 2020-12-06 LAB — COMPREHENSIVE METABOLIC PANEL
ALT: 113 U/L — ABNORMAL HIGH (ref 0–44)
AST: 76 U/L — ABNORMAL HIGH (ref 15–41)
Albumin: 2.5 g/dL — ABNORMAL LOW (ref 3.5–5.0)
Alkaline Phosphatase: 57 U/L (ref 38–126)
Anion gap: 6 (ref 5–15)
BUN: 20 mg/dL (ref 8–23)
CO2: 36 mmol/L — ABNORMAL HIGH (ref 22–32)
Calcium: 8.4 mg/dL — ABNORMAL LOW (ref 8.9–10.3)
Chloride: 94 mmol/L — ABNORMAL LOW (ref 98–111)
Creatinine, Ser: 1.17 mg/dL (ref 0.61–1.24)
GFR, Estimated: >60 mL/min (ref >60)
Glucose, Bld: 117 mg/dL — ABNORMAL HIGH (ref 70–99)
Potassium: 4.3 mmol/L (ref 3.5–5.1)
Sodium: 136 mmol/L (ref 135–145)
Total Bilirubin: 1 mg/dL (ref 0.3–1.2)
Total Protein: 6.2 g/dL — ABNORMAL LOW (ref 6.5–8.1)

## 2020-12-06 LAB — CBC WITH DIFFERENTIAL/PLATELET
Abs Immature Granulocytes: 0.03 10*3/uL (ref 0.00–0.07)
Basophils Absolute: 0 10*3/uL (ref 0.0–0.1)
Basophils Relative: 0 %
Eosinophils Absolute: 0 10*3/uL (ref 0.0–0.5)
Eosinophils Relative: 0 %
HCT: 41.7 % (ref 39.0–52.0)
Hemoglobin: 12.7 g/dL — ABNORMAL LOW (ref 13.0–17.0)
Immature Granulocytes: 0 %
Lymphocytes Relative: 12 %
Lymphs Abs: 1 10*3/uL (ref 0.7–4.0)
MCH: 23.9 pg — ABNORMAL LOW (ref 26.0–34.0)
MCHC: 30.5 g/dL (ref 30.0–36.0)
MCV: 78.4 fL — ABNORMAL LOW (ref 80.0–100.0)
Monocytes Absolute: 1 10*3/uL (ref 0.1–1.0)
Monocytes Relative: 12 %
Neutro Abs: 6.1 10*3/uL (ref 1.7–7.7)
Neutrophils Relative %: 76 %
Platelets: 159 10*3/uL (ref 150–400)
RBC: 5.32 MIL/uL (ref 4.22–5.81)
RDW: 12.8 % (ref 11.5–15.5)
WBC: 8.1 10*3/uL (ref 4.0–10.5)
nRBC: 0 % (ref 0.0–0.2)

## 2020-12-06 LAB — D-DIMER, QUANTITATIVE: D-Dimer, Quant: 0.47 ug/mL-FEU (ref 0.00–0.50)

## 2020-12-06 LAB — MAGNESIUM: Magnesium: 2.2 mg/dL (ref 1.7–2.4)

## 2020-12-06 LAB — FERRITIN: Ferritin: 819 ng/mL — ABNORMAL HIGH (ref 24–336)

## 2020-12-06 LAB — C-REACTIVE PROTEIN: CRP: 0.6 mg/dL (ref <1.0)

## 2020-12-06 LAB — PHOSPHORUS: Phosphorus: 3.5 mg/dL (ref 2.5–4.6)

## 2020-12-06 MED ORDER — ALPRAZOLAM 0.5 MG PO TABS
0.5000 mg | ORAL_TABLET | Freq: Three times a day (TID) | ORAL | Status: DC | PRN
  Administered 2020-12-06 – 2020-12-10 (×5): 0.5 mg via ORAL
  Filled 2020-12-06 (×5): qty 1

## 2020-12-06 MED ORDER — PREDNISONE 50 MG PO TABS
50.0000 mg | ORAL_TABLET | Freq: Every day | ORAL | Status: DC
  Administered 2020-12-07 – 2020-12-14 (×8): 50 mg via ORAL
  Filled 2020-12-06 (×8): qty 1

## 2020-12-06 MED ORDER — METHYLPREDNISOLONE SODIUM SUCC 125 MG IJ SOLR
120.0000 mg | INTRAMUSCULAR | Status: AC
  Administered 2020-12-06: 120 mg via INTRAVENOUS
  Filled 2020-12-06: qty 2

## 2020-12-06 NOTE — Progress Notes (Signed)
PROGRESS NOTE    [name] [name]  [id number] DOB: 12/22/1960 DOA: 12/04/2020 PCP: Charlott Rakes, MD   Chief Complain: Worsening COVID   Brief Narrative:   Patient is a 60 year old male with history of COPD on oxygen at home at 4 L/min on, hypertension who presented with worsening COVID symptoms.  He was recently exposed to his grandson who had [place].  He called his PCP and was taking Paxilovid but that did not help.  So he presented to the emergency department.  Admitted for COVID-pneumonia.  Assessment & Plan:   Principal Problem:   Acute hypoxemic respiratory failure due to COVID-19 [facility]) Active Problems:   Tobacco abuse, in remission   Hypertension   COPD without exacerbation (HCC)   Acute on chronic hypoxic respiratory failure due to COVID pneumonia: Presented with shortness of breath, wheezing, cough.  Recently exposed to [place], taking Paxilovid.  Patient labs showed elevated LFTs, elevated LDH, elevated D-dimer, ferritin.  CXR  showed multiple opacities consistent with COVID-pneumonia.  Procalcitonin is nonreassuring.  Started on steroids, remdesivir. We  Will consider Actemra if no improvement.PT/OT consulted He is saturating around 94% on 8 L of high flow oxygen  We recommend frequent pronation as tolerated. Continue incentive spirometer as needed.  History of COPD: Fairly advanced.  On 4 L of oxygen at home.  Continue home Symbicort, Spiriva.  Continue incentive spirometer, flutter valve.  Patient is a past smoker, quit about a year ago  Gout: On allopurinol  Hypertension: On amlodipine, losartan, Toprol  Hyperlipidemia: On Zetia at home  Obesity: BMI of  37         DVT prophylaxis:Lovenox Code Status: Full Family Communication:Called and discussed with the daughter on phone on 8/29 Status is: Inpatient  Remains inpatient appropriate because:Inpatient level of care appropriate due to severity of illness  Dispo: The patient is from: Home               Anticipated d/c is to: Home              Patient currently is not medically stable to d/c.   Difficult to place patient No      Consultants: None  Procedures:None  Antimicrobials:  Anti-infectives (From admission, onward)    Start     Dose/Rate Route Frequency Ordered Stop   12/05/20 1000  remdesivir 100 mg in sodium chloride 0.9 % 100 mL IVPB       See Hyperspace for full Linked Orders Report.   100 mg 200 mL/hr over 30 Minutes Intravenous Daily 12/04/20 1248 12/09/20 0959   12/04/20 1500  remdesivir 200 mg in sodium chloride 0.9% 250 mL IVPB       See Hyperspace for full Linked Orders Report.   200 mg 580 mL/hr over 30 Minutes Intravenous Once 12/04/20 1248 12/04/20 1556       Subjective:  Patient seen and examined at the bedside this morning.  He was saturating around 94% to 95% on 8 L of oxygen.  He says he feels little better today.  He was using his incentive spirometer by sitting at the bedside.  Looks little tachypneic , anxious.  Objective: Vitals:   12/05/20 1402 12/05/20 2236 12/06/20 0000 12/06/20 0600  BP:  (!) 135/98 (!) 137/99 (!) 136/98  Pulse:      Resp: (!) 25 (!) '24 15 17  '$ Temp:  98.3 F (36.8 C)  98.5 F (36.9 C)  TempSrc:  Oral  Oral  SpO2: (!) 89% 98% 99%  Weight:    101.7 kg  Height:        Intake/Output Summary (Last 24 hours) at 12/06/2020 0809 Last data filed at 12/06/2020 [id number] Gross per 24 hour  Intake 701.4 ml  Output 1980 ml  Net -1278.6 ml   Filed Weights   12/04/20 1953 12/04/20 2047 12/06/20 0600  Weight: 104.3 kg 105.5 kg 101.7 kg    Examination:  General exam: In mild respiratory distress, obese HEENT: PERRL Respiratory system: Diminished air sounds bilaterally, bilateral expiratory wheezes Cardiovascular system: S1 & S2 heard, RRR.  Gastrointestinal system: Abdomen is nondistended, soft and nontender. Central nervous system: Alert and oriented Extremities: No edema, no clubbing ,no cyanosis Skin: No rashes, no  ulcers,no icterus       Data Reviewed: I have personally reviewed following labs and imaging studies  CBC: Recent Labs  Lab 12/04/20 0731 12/05/20 0405 12/06/20 0325  WBC 8.0 4.7 8.1  NEUTROABS  --  2.8 6.1  HGB 13.7 13.3 12.7*  HCT 46.0 45.1 41.7  MCV 79.4* 79.5* 78.4*  PLT 166 165 [id number]   Basic Metabolic Panel: Recent Labs  Lab 12/04/20 0731 12/05/20 0405 12/06/20 0325  NA 135 135 136  K 3.5 4.3 4.3  CL 92* 93* 94*  CO2 35* 34* 36*  GLUCOSE 118* 116* 117*  BUN '10 15 20  '$ CREATININE 1.17 1.16 1.17  CALCIUM 8.4* 8.6* 8.4*  MG  --  1.9 2.2  PHOS  --  3.4 3.5   GFR: Estimated Creatinine Clearance: 75.3 mL/min (by C-G formula based on SCr of 1.17 mg/dL). Liver Function Tests: Recent Labs  Lab 12/04/20 1402 12/05/20 0405 12/06/20 0325  AST 125* 103* 76*  ALT 161* 141* 113*  ALKPHOS 64 64 57  BILITOT 1.1 1.0 1.0  PROT 6.8 6.9 6.2*  ALBUMIN 2.8* 2.8* 2.5*   No results for input(s): LIPASE, AMYLASE in the last 168 hours. No results for input(s): AMMONIA in the last 168 hours. Coagulation Profile: No results for input(s): INR, PROTIME in the last 168 hours. Cardiac Enzymes: No results for input(s): CKTOTAL, CKMB, CKMBINDEX, TROPONINI in the last 168 hours. BNP (last 3 results) No results for input(s): PROBNP in the last 8760 hours. HbA1C: No results for input(s): HGBA1C in the last 72 hours. CBG: No results for input(s): GLUCAP in the last 168 hours. Lipid Profile: No results for input(s): CHOL, HDL, LDLCALC, TRIG, CHOLHDL, LDLDIRECT in the last 72 hours. Thyroid Function Tests: No results for input(s): TSH, T4TOTAL, FREET4, T3FREE, THYROIDAB in the last 72 hours. Anemia Panel: Recent Labs    12/05/20 0405 12/06/20 0325  FERRITIN 1,360* 819*   Sepsis Labs: Recent Labs  Lab 12/04/20 1402  PROCALCITON 0.19    Recent Results (from the past 240 hour(s))  Resp Panel by RT-PCR (Flu A&B, Covid) Nasopharyngeal Swab     Status: Abnormal   Collection  Time: 12/04/20  7:55 AM   Specimen: Nasopharyngeal Swab; Nasopharyngeal(NP) swabs in vial transport medium  Result Value Ref Range Status   SARS Coronavirus 2 by RT PCR POSITIVE (A) NEGATIVE Final    Comment: RESULT CALLED TO, READ BACK BY AND VERIFIED WITH: RN E.BANKS ON [id number] AT 1045 BY E.PARRISH (NOTE) SARS-CoV-2 target nucleic acids are DETECTED.  The SARS-CoV-2 RNA is generally detectable in upper respiratory specimens during the acute phase of infection. Positive results are indicative of the presence of the identified virus, but do not rule out bacterial infection or co-infection with other pathogens not detected by the test.  Clinical correlation with patient history and other diagnostic information is necessary to determine patient infection status. The expected result is Negative.  Fact Sheet for Patients: EntrepreneurPulse.com.au  Fact Sheet for Healthcare Providers: IncredibleEmployment.be  This test is not yet approved or cleared by the [place] FDA and  has been authorized for detection and/or diagnosis of SARS-CoV-2 by FDA under an Emergency Use Authorization (EUA).  This EUA will remain in effect (meaning this t est can be used) for the duration of  the COVID-19 declaration under Section 564(b)(1) of the Act, 21 U.S.C. section 360bbb-3(b)(1), unless the authorization is terminated or revoked sooner.     Influenza A by PCR NEGATIVE NEGATIVE Final   Influenza B by PCR NEGATIVE NEGATIVE Final    Comment: (NOTE) The Xpert Xpress SARS-CoV-2/FLU/RSV plus assay is intended as an aid in the diagnosis of influenza from Nasopharyngeal swab specimens and should not be used as a sole basis for treatment. Nasal washings and aspirates are unacceptable for Xpert Xpress SARS-CoV-2/FLU/RSV testing.  Fact Sheet for Patients: EntrepreneurPulse.com.au  Fact Sheet for Healthcare  Providers: IncredibleEmployment.be  This test is not yet approved or cleared by the [place] FDA and has been authorized for detection and/or diagnosis of SARS-CoV-2 by FDA under an Emergency Use Authorization (EUA). This EUA will remain in effect (meaning this test can be used) for the duration of the COVID-19 declaration under Section 564(b)(1) of the Act, 21 U.S.C. section 360bbb-3(b)(1), unless the authorization is terminated or revoked.  Performed at [facility], [place] [address] [place], [place] [address]          Radiology Studies: No results found.      Scheduled Meds:  allopurinol  100 mg Oral Daily   amLODipine  10 mg Oral Daily   vitamin C  500 mg Oral Daily   docusate sodium  100 mg Oral BID   enoxaparin (LOVENOX) injection  40 mg Subcutaneous Q24H   gabapentin  300 mg Oral QHS   losartan  50 mg Oral Daily   methylPREDNISolone (SOLU-MEDROL) injection  50 mg Intravenous Q12H   Followed by   Derrill Memo ON 12/08/2020] predniSONE  50 mg Oral Daily   metoprolol succinate  50 mg Oral Daily   mometasone-formoterol  2 puff Inhalation BID   sodium chloride flush  3 mL Intravenous Q12H   sodium chloride flush  3 mL Intravenous Q12H   terbinafine   Topical Daily   triamcinolone ointment  1 application Topical BID   umeclidinium bromide  1 puff Inhalation Daily   zinc sulfate  220 mg Oral Daily   Continuous Infusions:  sodium chloride Stopped (12/05/20 1200)   remdesivir 100 mg in NS 100 mL Stopped (12/05/20 1200)     LOS: 2 days    Time spent: 35 mins,More than 50% of that time was spent in counseling and/or coordination of care.      Shelly Coss, MD [facility] P8/29/2022, 8:09 AM

## 2020-12-07 ENCOUNTER — Inpatient Hospital Stay (HOSPITAL_COMMUNITY): Payer: Medicaid Other

## 2020-12-07 DIAGNOSIS — J439 Emphysema, unspecified: Secondary | ICD-10-CM | POA: Diagnosis not present

## 2020-12-07 DIAGNOSIS — J9690 Respiratory failure, unspecified, unspecified whether with hypoxia or hypercapnia: Secondary | ICD-10-CM | POA: Diagnosis not present

## 2020-12-07 LAB — C-REACTIVE PROTEIN: CRP: <0.5 mg/dL (ref <1.0)

## 2020-12-07 LAB — FERRITIN: Ferritin: 601 ng/mL — ABNORMAL HIGH (ref 24–336)

## 2020-12-07 LAB — CBC WITH DIFFERENTIAL/PLATELET
Abs Immature Granulocytes: 0.07 10*3/uL (ref 0.00–0.07)
Basophils Absolute: 0 10*3/uL (ref 0.0–0.1)
Basophils Relative: 0 %
Eosinophils Absolute: 0 10*3/uL (ref 0.0–0.5)
Eosinophils Relative: 0 %
HCT: 44.2 % (ref 39.0–52.0)
Hemoglobin: 13.4 g/dL (ref 13.0–17.0)
Immature Granulocytes: 1 %
Lymphocytes Relative: 9 %
Lymphs Abs: 0.9 10*3/uL (ref 0.7–4.0)
MCH: 23.6 pg — ABNORMAL LOW (ref 26.0–34.0)
MCHC: 30.3 g/dL (ref 30.0–36.0)
MCV: 77.7 fL — ABNORMAL LOW (ref 80.0–100.0)
Monocytes Absolute: 0.8 10*3/uL (ref 0.1–1.0)
Monocytes Relative: 7 %
Neutro Abs: 8.8 10*3/uL — ABNORMAL HIGH (ref 1.7–7.7)
Neutrophils Relative %: 83 %
Platelets: 208 10*3/uL (ref 150–400)
RBC: 5.69 MIL/uL (ref 4.22–5.81)
RDW: 12.9 % (ref 11.5–15.5)
WBC: 10.6 10*3/uL — ABNORMAL HIGH (ref 4.0–10.5)
nRBC: 0 % (ref 0.0–0.2)

## 2020-12-07 LAB — COMPREHENSIVE METABOLIC PANEL
ALT: 109 U/L — ABNORMAL HIGH (ref 0–44)
AST: 60 U/L — ABNORMAL HIGH (ref 15–41)
Albumin: 2.5 g/dL — ABNORMAL LOW (ref 3.5–5.0)
Alkaline Phosphatase: 53 U/L (ref 38–126)
Anion gap: 6 (ref 5–15)
BUN: 23 mg/dL (ref 8–23)
CO2: 36 mmol/L — ABNORMAL HIGH (ref 22–32)
Calcium: 8.2 mg/dL — ABNORMAL LOW (ref 8.9–10.3)
Chloride: 95 mmol/L — ABNORMAL LOW (ref 98–111)
Creatinine, Ser: 1.12 mg/dL (ref 0.61–1.24)
GFR, Estimated: >60 mL/min (ref >60)
Glucose, Bld: 125 mg/dL — ABNORMAL HIGH (ref 70–99)
Potassium: 4.4 mmol/L (ref 3.5–5.1)
Sodium: 137 mmol/L (ref 135–145)
Total Bilirubin: 0.8 mg/dL (ref 0.3–1.2)
Total Protein: 6.3 g/dL — ABNORMAL LOW (ref 6.5–8.1)

## 2020-12-07 LAB — MAGNESIUM: Magnesium: 2.2 mg/dL (ref 1.7–2.4)

## 2020-12-07 LAB — PHOSPHORUS: Phosphorus: 3.6 mg/dL (ref 2.5–4.6)

## 2020-12-07 LAB — D-DIMER, QUANTITATIVE: D-Dimer, Quant: 0.46 ug/mL-FEU (ref 0.00–0.50)

## 2020-12-07 NOTE — Progress Notes (Signed)
PROGRESS NOTE    [name] [name]  [id number] DOB: 1961-03-05 DOA: 12/04/2020 PCP: Charlott Rakes, MD   Chief Complain: Worsening COVID   Brief Narrative:  Patient is a 60 year old male with history of COPD on oxygen at home at 4 L/min on, hypertension who presented with worsening COVID symptoms.  He was recently exposed to his grandson who had [place].  He called his PCP and was taking Paxilovid but that did not help.  So he presented to the emergency department.  Currently being managed for COVID-pneumonia.  Assessment & Plan:   Principal Problem:   Acute hypoxemic respiratory failure due to COVID-19 [facility]) Active Problems:   Tobacco abuse, in remission   Hypertension   COPD without exacerbation (HCC)   Acute on chronic hypoxic respiratory failure due to COVID pneumonia: Presented with shortness of breath, wheezing, cough.  Recently exposed to [place], taking Paxilovid.  Patient labs showed elevated LFTs, elevated LDH, elevated D-dimer, ferritin.  CXR  showed multiple opacities consistent with COVID-pneumonia.  Procalcitonin was nonreassuring.  Started on steroids, remdesivir. We will consider Actemra if no improvement but patient is slowly improving. He is saturating around 94% on 8 L of high flow oxygen  We recommend frequent pronation as tolerated. Continue incentive spirometer as needed.  Inflammatory markers are improving.  Checking follow-up chest x-ray today.  History of COPD: Fairly advanced.  On 4 L of oxygen at home.  Continue home Symbicort, Spiriva.  Continue incentive spirometer, flutter valve.  Patient is a past smoker, quit about a year ago  Gout: On allopurinol  Hypertension: On amlodipine, losartan, Toprol  Hyperlipidemia: On Zetia at home  Obesity: BMI of  37  Debility/deconditioning: Secondary to COVID.  PT/OT recommended home health on discharge.         DVT prophylaxis:Lovenox Code Status: Full Family Communication:Called and discussed with the  daughter on phone on 8/29 Status is: Inpatient  Remains inpatient appropriate because:Inpatient level of care appropriate due to severity of illness  Dispo: The patient is from: Home              Anticipated d/c is to: Home              Patient currently is not medically stable to d/c.   Difficult to place patient No      Consultants: None  Procedures:None  Antimicrobials:  Anti-infectives (From admission, onward)    Start     Dose/Rate Route Frequency Ordered Stop   12/05/20 1000  remdesivir 100 mg in sodium chloride 0.9 % 100 mL IVPB       See Hyperspace for full Linked Orders Report.   100 mg 200 mL/hr over 30 Minutes Intravenous Daily 12/04/20 1248 12/09/20 0959   12/04/20 1500  remdesivir 200 mg in sodium chloride 0.9% 250 mL IVPB       See Hyperspace for full Linked Orders Report.   200 mg 580 mL/hr over 30 Minutes Intravenous Once 12/04/20 1248 12/04/20 1556       Subjective:  Patient seen and examined at the bedside this morning.  On 8 L of high flow oxygen.  Feels little better than yesterday.  He was working with occupational therapist.  Denies any worsening cough or shortness of breath.  Objective: Vitals:   12/06/20 1239 12/06/20 2259 12/07/20 0000 12/07/20 0400  BP:    (!) 138/116  Pulse:      Resp: 20     Temp:  98.5 F (36.9 C) 98.4 F (36.9 C) 98.4  F (36.9 C)  TempSrc:  Oral Oral Oral  SpO2:    95%  Weight:    101.9 kg  Height:        Intake/Output Summary (Last 24 hours) at 12/07/2020 0811 Last data filed at 12/07/2020 0400 Gross per 24 hour  Intake 820 ml  Output 1850 ml  Net -1030 ml   Filed Weights   12/04/20 2047 12/06/20 0600 12/07/20 0400  Weight: 105.5 kg 101.7 kg 101.9 kg    Examination:  General exam:  not in distress,obese HEENT: PERRL Respiratory system: Diminished air sounds bilaterally, no wheezes or crackles  Cardiovascular system: S1 & S2 heard, RRR.  Gastrointestinal system: Abdomen is nondistended, soft and  nontender. Central nervous system: Alert and oriented Extremities: No edema, no clubbing ,no cyanosis Skin: No rashes, no ulcers,no icterus       Data Reviewed: I have personally reviewed following labs and imaging studies  CBC: Recent Labs  Lab 12/04/20 0731 12/05/20 0405 12/06/20 0325 12/07/20 0402  WBC 8.0 4.7 8.1 10.6*  NEUTROABS  --  2.8 6.1 8.8*  HGB 13.7 13.3 12.7* 13.4  HCT 46.0 45.1 41.7 44.2  MCV 79.4* 79.5* 78.4* 77.7*  PLT 166 165 159 [id number]   Basic Metabolic Panel: Recent Labs  Lab 12/04/20 0731 12/05/20 0405 12/06/20 0325 12/07/20 0402  NA 135 135 136 137  K 3.5 4.3 4.3 4.4  CL 92* 93* 94* 95*  CO2 35* 34* 36* 36*  GLUCOSE 118* 116* 117* 125*  BUN '10 15 20 23  '$ CREATININE 1.17 1.16 1.17 1.12  CALCIUM 8.4* 8.6* 8.4* 8.2*  MG  --  1.9 2.2 2.2  PHOS  --  3.4 3.5 3.6   GFR: Estimated Creatinine Clearance: 78.8 mL/min (by C-G formula based on SCr of 1.12 mg/dL). Liver Function Tests: Recent Labs  Lab 12/04/20 1402 12/05/20 0405 12/06/20 0325 12/07/20 0402  AST 125* 103* 76* 60*  ALT 161* 141* 113* 109*  ALKPHOS 64 64 57 53  BILITOT 1.1 1.0 1.0 0.8  PROT 6.8 6.9 6.2* 6.3*  ALBUMIN 2.8* 2.8* 2.5* 2.5*   No results for input(s): LIPASE, AMYLASE in the last 168 hours. No results for input(s): AMMONIA in the last 168 hours. Coagulation Profile: No results for input(s): INR, PROTIME in the last 168 hours. Cardiac Enzymes: No results for input(s): CKTOTAL, CKMB, CKMBINDEX, TROPONINI in the last 168 hours. BNP (last 3 results) No results for input(s): PROBNP in the last 8760 hours. HbA1C: No results for input(s): HGBA1C in the last 72 hours. CBG: No results for input(s): GLUCAP in the last 168 hours. Lipid Profile: No results for input(s): CHOL, HDL, LDLCALC, TRIG, CHOLHDL, LDLDIRECT in the last 72 hours. Thyroid Function Tests: No results for input(s): TSH, T4TOTAL, FREET4, T3FREE, THYROIDAB in the last 72 hours. Anemia Panel: Recent Labs     12/06/20 0325 12/07/20 0402  FERRITIN 819* 601*   Sepsis Labs: Recent Labs  Lab 12/04/20 1402  PROCALCITON 0.19    Recent Results (from the past 240 hour(s))  Resp Panel by RT-PCR (Flu A&B, Covid) Nasopharyngeal Swab     Status: Abnormal   Collection Time: 12/04/20  7:55 AM   Specimen: Nasopharyngeal Swab; Nasopharyngeal(NP) swabs in vial transport medium  Result Value Ref Range Status   SARS Coronavirus 2 by RT PCR POSITIVE (A) NEGATIVE Final    Comment: RESULT CALLED TO, READ BACK BY AND VERIFIED WITH: RN E.BANKS ON [id number] AT 1045 BY E.PARRISH (NOTE) SARS-CoV-2 target nucleic acids are DETECTED.  The SARS-CoV-2 RNA is generally detectable in upper respiratory specimens during the acute phase of infection. Positive results are indicative of the presence of the identified virus, but do not rule out bacterial infection or co-infection with other pathogens not detected by the test. Clinical correlation with patient history and other diagnostic information is necessary to determine patient infection status. The expected result is Negative.  Fact Sheet for Patients: EntrepreneurPulse.com.au  Fact Sheet for Healthcare Providers: IncredibleEmployment.be  This test is not yet approved or cleared by the [place] FDA and  has been authorized for detection and/or diagnosis of SARS-CoV-2 by FDA under an Emergency Use Authorization (EUA).  This EUA will remain in effect (meaning this t est can be used) for the duration of  the COVID-19 declaration under Section 564(b)(1) of the Act, 21 U.S.C. section 360bbb-3(b)(1), unless the authorization is terminated or revoked sooner.     Influenza A by PCR NEGATIVE NEGATIVE Final   Influenza B by PCR NEGATIVE NEGATIVE Final    Comment: (NOTE) The Xpert Xpress SARS-CoV-2/FLU/RSV plus assay is intended as an aid in the diagnosis of influenza from Nasopharyngeal swab specimens and should not be used  as a sole basis for treatment. Nasal washings and aspirates are unacceptable for Xpert Xpress SARS-CoV-2/FLU/RSV testing.  Fact Sheet for Patients: EntrepreneurPulse.com.au  Fact Sheet for Healthcare Providers: IncredibleEmployment.be  This test is not yet approved or cleared by the [place] FDA and has been authorized for detection and/or diagnosis of SARS-CoV-2 by FDA under an Emergency Use Authorization (EUA). This EUA will remain in effect (meaning this test can be used) for the duration of the COVID-19 declaration under Section 564(b)(1) of the Act, 21 U.S.C. section 360bbb-3(b)(1), unless the authorization is terminated or revoked.  Performed at [facility], [place] [address] [place], [place] [address]          Radiology Studies: No results found.      Scheduled Meds:  allopurinol  100 mg Oral Daily   amLODipine  10 mg Oral Daily   vitamin C  500 mg Oral Daily   docusate sodium  100 mg Oral BID   enoxaparin (LOVENOX) injection  40 mg Subcutaneous Q24H   gabapentin  300 mg Oral QHS   losartan  50 mg Oral Daily   metoprolol succinate  50 mg Oral Daily   mometasone-formoterol  2 puff Inhalation BID   predniSONE  50 mg Oral Daily   sodium chloride flush  3 mL Intravenous Q12H   sodium chloride flush  3 mL Intravenous Q12H   terbinafine   Topical Daily   triamcinolone ointment  1 application Topical BID   umeclidinium bromide  1 puff Inhalation Daily   zinc sulfate  220 mg Oral Daily   Continuous Infusions:  sodium chloride Stopped (12/05/20 1200)   remdesivir 100 mg in NS 100 mL Stopped (12/06/20 0930)     LOS: 3 days    Time spent: 35 mins,More than 50% of that time was spent in counseling and/or coordination of care.      Shelly Coss, MD [facility] P8/30/2022, 8:11 AM

## 2020-12-07 NOTE — Progress Notes (Signed)
Physical Therapy Treatment Patient Details Name: [name] [name] MRN: [id number] DOB: 09-12-1960 Today's Date: 12/07/2020    History of Present Illness Pt is a 60 y.o. male admitted 12/04/20 with SOB, cough, hypoxia. Workup for acute hypoxic respiratory failure secondary to COVID-19 PNA. PMH includes COPD (4L O2 baseline), HTN.   PT Comments    Pt limited by fatigue this session having recently been OOB for seated/standing activity; he reports doing this regularly throughout day. Reviewed educ re: therex (HEP handout provided), activity recommendations, incentive spirometer use (pulling </ 1214m), importance of mobility. Pt remains on 8L O2 HFNC. Will continue to follow acutely.    Follow Up Recommendations  No PT follow up     Equipment Recommendations   (TBD)    Recommendations for Other Services       Precautions / Restrictions Precautions Precautions: Fall;Other (comment) Precaution Comments: Watch SpO2 (wears 4L O2 baseline) Restrictions Weight Bearing Restrictions: No    Mobility  Bed Mobility Overal bed mobility: Modified Independent Bed Mobility: Supine to Sit;Sit to Supine           General bed mobility comments: HOB elevated, use of bed rail    Transfers                 General transfer comment: pt declined, reports he just got back to bed from doing seated/standing activity  Ambulation/Gait                 Stairs             Wheelchair Mobility    Modified Rankin (Stroke Patients Only)       Balance Overall balance assessment: Needs assistance Sitting-balance support: No upper extremity supported;Feet supported Sitting balance-Leahy Scale: Good                                      Cognition Arousal/Alertness: Awake/alert Behavior During Therapy: WFL for tasks assessed/performed Overall Cognitive Status: Within Functional Limits for tasks assessed                                         Exercises Other Exercises Other Exercises: [employer] HEP handout (Access Code [id number] provided - repeated sit<>stands, partial squats w/ UE support, standing calf raises w/ UE support, standing march    General Comments General comments (skin integrity, edema, etc.): Reviewed educ re: activity recommendations, therex (handout provided), incentive spirometer use (pt pulling </ 12537m, pursed lip breathing. SpO2 94% on 8L O2 HFNC while seated EOB      Pertinent Vitals/Pain Pain Assessment: No/denies pain Pain Intervention(s): Monitored during session    Home Living                      Prior Function            PT Goals (current goals can now be found in the care plan section) Progress towards PT goals: Progressing toward goals    Frequency    Min 3X/week      PT Plan Current plan remains appropriate    Co-evaluation              AM-PAC PT "6 Clicks" Mobility   Outcome Measure  Help needed turning from your back to your side while in a flat bed without using bedrails?:  None Help needed moving from lying on your back to sitting on the side of a flat bed without using bedrails?: None Help needed moving to and from a bed to a chair (including a wheelchair)?: None Help needed standing up from a chair using your arms (e.g., wheelchair or bedside chair)?: None Help needed to walk in hospital room?: A Little Help needed climbing 3-5 steps with a railing? : A Little 6 Click Score: 22    End of Session Equipment Utilized During Treatment: Oxygen Activity Tolerance: Patient limited by fatigue Patient left: in bed;with call bell/phone within reach Nurse Communication: Mobility status PT Visit Diagnosis: Other abnormalities of gait and mobility (R26.89)     Time: [id number] PT Time Calculation (min) (ACUTE ONLY): 17 min  Charges:  $Self Care/Home Management: 8-22                     Mabeline Caras, PT, DPT Acute Rehabilitation Services  Pager  [phone] Office [place] 12/07/2020, 4:13 PM

## 2020-12-07 NOTE — Progress Notes (Signed)
Occupational Therapy Treatment Patient Details Name: [name] [name] MRN: [id number] DOB: [birth year] Today's Date: 12/07/2020    History of present illness 60 y.o. male presents to [facility] ED on 12/04/2020 with worsening COVID symptoms, including hypoxia, SOB, and cough. PMH includes COPD and HTN.   OT comments  Pt remains motivated to return to PLOF and work with therapy. On 8L O2 via HFNC throughout session and with activity drops down as low as 82% requires about 2 min seated rest break and PLB. Pt gets DOE 3/4 with activity. Pt also declining use of RW during session today preferring to "furniture surf" despite education for RW as tool for EC and balance. Pt was able to perform standing grooming at sink (1 activity) before seated rest break, toilet transfer, standing peri care (with warm wash cloth) which required seated rest break afterwards, and requested to return back to bed in lieu of chair. Pt does have IS in room but MD in room so did not review/observe use. OT will continue following acutely to maximize safety and independence in ADL and functional transfers as well as continue energy conservation strategies with special focus on ADL/IADL.    Follow Up Recommendations  Home health OT;Supervision - Intermittent    Equipment Recommendations  None recommended by OT    Recommendations for Other Services      Precautions / Restrictions Precautions Precautions: Fall;Other (comment) Precaution Comments: pt wears 4L O2 at baseline Restrictions Weight Bearing Restrictions: No       Mobility Bed Mobility Overal bed mobility: Needs Assistance Bed Mobility: Sidelying to Sit   Sidelying to sit: Supervision       General bed mobility comments: use of rails    Transfers Overall transfer level: Needs assistance Equipment used: None (declined - would benefit) Transfers: Sit to/from Stand Sit to Stand: Min guard         General transfer comment: pushes off furniture for boost     Balance Overall balance assessment: Needs assistance Sitting-balance support: No upper extremity supported;Feet supported Sitting balance-Leahy Scale: Good     Standing balance support: Bilateral upper extremity supported;During functional activity Standing balance-Leahy Scale: Poor Standing balance comment: requires at least one UE support during both dynamic and static standing activities                           ADL either performed or assessed with clinical judgement   ADL Overall ADL's : Needs assistance/impaired     Grooming: Supervision/safety;Standing;Sitting;Wash/dry face;Oral care Grooming Details (indicate cue type and reason): leaning on elbows on counter. "this is how I usually do it." DOE 2/4 with standing, required seated rest break after one ADL     Lower Body Bathing: Sitting/lateral leans;Min guard Lower Body Bathing Details (indicate cue type and reason): from seated position Upper Body Dressing : Set up;Sitting Upper Body Dressing Details (indicate cue type and reason): extra gown Lower Body Dressing: Sitting/lateral leans;Sit to/from stand;Cueing for safety;Minimal assistance Lower Body Dressing Details (indicate cue type and reason): able to don underwear from seated position and pull up in standing with min guard for safety Toilet Transfer: Min guard Toilet Transfer Details (indicate cue type and reason): furniture surfs - educated on use of RW as tool until he is feeling stronger and better Toileting- Water quality scientist and Hygiene: Sit to/from stand;Min guard Toileting - Clothing Manipulation Details (indicate cue type and reason): able to manage gown, underwear, and get himself clean  Functional mobility during ADLs: Minimal assistance;Min guard;Cueing for safety (furniture surfing) General ADL Comments: reinforced energy conservation strategies for ADL and to maximize safety and independence during recovery.     Vision   Vision  Assessment?: No apparent visual deficits   Perception     Praxis      Cognition Arousal/Alertness: Awake/alert Behavior During Therapy: WFL for tasks assessed/performed Overall Cognitive Status: Within Functional Limits for tasks assessed                                 General Comments: very motivated, wants to be independent        Exercises     Shoulder Instructions       General Comments DOE 3/4 with activity requiring frequent rest breaks (after oral care standing at sink, after standing for peri care) SpO2 dropped to 82% on 8L HFNC, returned to >90% with seated rest and PLB    Pertinent Vitals/ Pain       Pain Assessment: No/denies pain  Home Living                                          Prior Functioning/Environment              Frequency  Min 2X/week        Progress Toward Goals  OT Goals(current goals can now be found in the care plan section)  Progress towards OT goals: Progressing toward goals  Acute Rehab OT Goals Patient Stated Goal: to return to independence and prior level of activity tolerance OT Goal Formulation: With patient Time For Goal Achievement: 12/19/20 Potential to Achieve Goals: Good ADL Goals Additional ADL Goal #1: pt will perform x6 mins of OOB ADL with encouragement to stand upright or sit for tasks with O2 sats  >90%, Additional ADL Goal #2: Pt will state 4 energy conservation strategies in order to increase ability activity tolerance.  Plan Discharge plan remains appropriate    Co-evaluation                 AM-PAC OT "6 Clicks" Daily Activity     Outcome Measure   Help from another person eating meals?: None Help from another person taking care of personal grooming?: A Little Help from another person toileting, which includes using toliet, bedpan, or urinal?: A Little Help from another person bathing (including washing, rinsing, drying)?: A Little Help from another person to  put on and taking off regular upper body clothing?: None Help from another person to put on and taking off regular lower body clothing?: A Little 6 Click Score: 20    End of Session Equipment Utilized During Treatment: Oxygen (8L HFNC)  OT Visit Diagnosis: Unsteadiness on feet (R26.81);Muscle weakness (generalized) (M62.81)   Activity Tolerance Patient limited by fatigue   Patient Left in bed;with call bell/phone within reach;with bed alarm set (MD in room)   Nurse Communication Mobility status        Time: [id number] OT Time Calculation (min): 39 min  Charges: OT General Charges $OT Visit: 1 Visit OT Treatments $Self Care/Home Management : 23-37 mins $Therapeutic Activity: 8-22 mins  Jesse Sans OTR/L Acute Rehabilitation Services Pager: [phone] Office: [place] 12/07/2020, 10:27 AM

## 2020-12-08 LAB — CBC WITH DIFFERENTIAL/PLATELET
Abs Immature Granulocytes: 0.11 10*3/uL — ABNORMAL HIGH (ref 0.00–0.07)
Basophils Absolute: 0 10*3/uL (ref 0.0–0.1)
Basophils Relative: 0 %
Eosinophils Absolute: 0 10*3/uL (ref 0.0–0.5)
Eosinophils Relative: 0 %
HCT: 45 % (ref 39.0–52.0)
Hemoglobin: 13.9 g/dL (ref 13.0–17.0)
Immature Granulocytes: 1 %
Lymphocytes Relative: 11 %
Lymphs Abs: 1.6 10*3/uL (ref 0.7–4.0)
MCH: 24 pg — ABNORMAL LOW (ref 26.0–34.0)
MCHC: 30.9 g/dL (ref 30.0–36.0)
MCV: 77.9 fL — ABNORMAL LOW (ref 80.0–100.0)
Monocytes Absolute: 1.6 10*3/uL — ABNORMAL HIGH (ref 0.1–1.0)
Monocytes Relative: 11 %
Neutro Abs: 10.7 10*3/uL — ABNORMAL HIGH (ref 1.7–7.7)
Neutrophils Relative %: 77 %
Platelets: 233 10*3/uL (ref 150–400)
RBC: 5.78 MIL/uL (ref 4.22–5.81)
RDW: 13 % (ref 11.5–15.5)
WBC: 14.1 10*3/uL — ABNORMAL HIGH (ref 4.0–10.5)
nRBC: 0 % (ref 0.0–0.2)

## 2020-12-08 LAB — FERRITIN: Ferritin: 626 ng/mL — ABNORMAL HIGH (ref 24–336)

## 2020-12-08 LAB — MAGNESIUM: Magnesium: 2.2 mg/dL (ref 1.7–2.4)

## 2020-12-08 LAB — COMPREHENSIVE METABOLIC PANEL
ALT: 135 U/L — ABNORMAL HIGH (ref 0–44)
AST: 64 U/L — ABNORMAL HIGH (ref 15–41)
Albumin: 2.6 g/dL — ABNORMAL LOW (ref 3.5–5.0)
Alkaline Phosphatase: 58 U/L (ref 38–126)
Anion gap: 5 (ref 5–15)
BUN: 23 mg/dL (ref 8–23)
CO2: 36 mmol/L — ABNORMAL HIGH (ref 22–32)
Calcium: 8.6 mg/dL — ABNORMAL LOW (ref 8.9–10.3)
Chloride: 96 mmol/L — ABNORMAL LOW (ref 98–111)
Creatinine, Ser: 1.15 mg/dL (ref 0.61–1.24)
GFR, Estimated: >60 mL/min (ref >60)
Glucose, Bld: 117 mg/dL — ABNORMAL HIGH (ref 70–99)
Potassium: 4.2 mmol/L (ref 3.5–5.1)
Sodium: 137 mmol/L (ref 135–145)
Total Bilirubin: 0.9 mg/dL (ref 0.3–1.2)
Total Protein: 6.4 g/dL — ABNORMAL LOW (ref 6.5–8.1)

## 2020-12-08 LAB — PHOSPHORUS: Phosphorus: 4 mg/dL (ref 2.5–4.6)

## 2020-12-08 LAB — D-DIMER, QUANTITATIVE: D-Dimer, Quant: 0.45 ug/mL-FEU (ref 0.00–0.50)

## 2020-12-08 LAB — C-REACTIVE PROTEIN: CRP: 0.8 mg/dL (ref <1.0)

## 2020-12-08 MED ORDER — TOCILIZUMAB 400 MG/20ML IV SOLN
800.0000 mg | Freq: Once | INTRAVENOUS | Status: AC
  Administered 2020-12-08: 800 mg via INTRAVENOUS
  Filled 2020-12-08 (×2): qty 40

## 2020-12-08 NOTE — Progress Notes (Signed)
PROGRESS NOTE    [name] [name]  [id number] DOB: 03/22/1961 DOA: 12/04/2020 PCP: Charlott Rakes, MD   Chief Complain: Worsening COVID   Brief Narrative:  Patient is a 60 year old male with history of COPD on oxygen at home at 4 L/min on, hypertension who presented with worsening COVID symptoms.  He was recently exposed to his grandson who had [place].  He called his PCP and was taking Paxilovid but that did not help.  So he presented to the emergency department.  Currently being managed for COVID-pneumonia.  Assessment & Plan:   Principal Problem:   Acute hypoxemic respiratory failure due to COVID-19 [facility]) Active Problems:   Tobacco abuse, in remission   Hypertension   COPD without exacerbation (HCC)   Acute on chronic hypoxic respiratory failure due to COVID pneumonia: Presented with shortness of breath, wheezing, cough.  Recently exposed to [place], taking Paxilovid.  Patient labs showed elevated LFTs, elevated LDH, elevated D-dimer, ferritin.  CXR  showed multiple opacities consistent with COVID-pneumonia.  Procalcitonin was nonreassuring.  Started on steroids, remdesivir.  Chest x-ray done on 12/07/2020 showed persistent pneumonia/possible worsening .Given a dose of Actemra on 12/08/20. He is saturating around 94% on 8 L of high flow oxygen  We recommend frequent pronation as tolerated. Continue incentive spirometer as needed.  Inflammatory markers are improving.  Checking follow-up chest x-ray today.  History of COPD: Fairly advanced.  On 4 L of oxygen at home.  Continue home Symbicort, Spiriva.  Continue incentive spirometer, flutter valve.  Patient is a past smoker, quit about a year ago  Leukocytosis: Most likely from steroids.  Continue to monitor  Gout: On allopurinol  Hypertension: On amlodipine, losartan, Toprol  Hyperlipidemia: On Zetia at home  Obesity: BMI of  37  Debility/deconditioning: Secondary to COVID.  PT/OT did not  recommended a follow up          DVT prophylaxis:Lovenox Code Status: Full Family Communication:Called and discussed with the daughter on phone on 8/29.  Patient said no need to call her daughter today Status is: Inpatient  Remains inpatient appropriate because:Inpatient level of care appropriate due to severity of illness  Dispo: The patient is from: Home              Anticipated d/c is to: Home              Patient currently is not medically stable to d/c.   Difficult to place patient No      Consultants: None  Procedures:None  Antimicrobials:  Anti-infectives (From admission, onward)    Start     Dose/Rate Route Frequency Ordered Stop   12/05/20 1000  remdesivir 100 mg in sodium chloride 0.9 % 100 mL IVPB       See Hyperspace for full Linked Orders Report.   100 mg 200 mL/hr over 30 Minutes Intravenous Daily 12/04/20 1248 12/09/20 0959   12/04/20 1500  remdesivir 200 mg in sodium chloride 0.9% 250 mL IVPB       See Hyperspace for full Linked Orders Report.   200 mg 580 mL/hr over 30 Minutes Intravenous Once 12/04/20 1248 12/04/20 1556       Subjective:  Patient seen and examined the bedside this morning.  Hemodynamically stable.  On 93 to 94% of oxygen on 8 L of high flow but he desaturates on minimal ambulation/exertion.  Denies any worsening cough or shortness of breath at rest  Objective: Vitals:   12/07/20 2235 12/08/20 0100 12/08/20 0300 12/08/20 0325  BP: Marland Kitchen)  128/98 117/86 121/85   Pulse: 77 70 75   Resp: '18 17 19   '$ Temp: 98.9 F (37.2 C) 98.5 F (36.9 C) 97.7 F (36.5 C)   TempSrc: Oral Oral Oral   SpO2: 93% 95% 94%   Weight:    101.2 kg  Height:        Intake/Output Summary (Last 24 hours) at 12/08/2020 0811 Last data filed at 12/08/2020 0314 Gross per 24 hour  Intake 840 ml  Output 1300 ml  Net -460 ml   Filed Weights   12/06/20 0600 12/07/20 0400 12/08/20 0325  Weight: 101.7 kg 101.9 kg 101.2 kg    Examination:  General exam: Overall comfortable, not in  distress,obese HEENT: PERRL Respiratory system:  diminished air sounds bilaterally Cardiovascular system: S1 & S2 heard, RRR.  Gastrointestinal system: Abdomen is nondistended, soft and nontender. Central nervous system: Alert and oriented Extremities: No edema, no clubbing ,no cyanosis Skin: No rashes, no ulcers,no icterus    Data Reviewed: I have personally reviewed following labs and imaging studies  CBC: Recent Labs  Lab 12/04/20 0731 12/05/20 0405 12/06/20 0325 12/07/20 0402 12/08/20 0344  WBC 8.0 4.7 8.1 10.6* 14.1*  NEUTROABS  --  2.8 6.1 8.8* 10.7*  HGB 13.7 13.3 12.7* 13.4 13.9  HCT 46.0 45.1 41.7 44.2 45.0  MCV 79.4* 79.5* 78.4* 77.7* 77.9*  PLT 166 165 159 208 0000000   Basic Metabolic Panel: Recent Labs  Lab 12/04/20 0731 12/05/20 0405 12/06/20 0325 12/07/20 0402 12/08/20 0344  NA 135 135 136 137 137  K 3.5 4.3 4.3 4.4 4.2  CL 92* 93* 94* 95* 96*  CO2 35* 34* 36* 36* 36*  GLUCOSE 118* 116* 117* 125* 117*  BUN '10 15 20 23 23  '$ CREATININE 1.17 1.16 1.17 1.12 1.15  CALCIUM 8.4* 8.6* 8.4* 8.2* 8.6*  MG  --  1.9 2.2 2.2 2.2  PHOS  --  3.4 3.5 3.6 4.0   GFR: Estimated Creatinine Clearance: 76.4 mL/min (by C-G formula based on SCr of 1.15 mg/dL). Liver Function Tests: Recent Labs  Lab 12/04/20 1402 12/05/20 0405 12/06/20 0325 12/07/20 0402 12/08/20 0344  AST 125* 103* 76* 60* 64*  ALT 161* 141* 113* 109* 135*  ALKPHOS 64 64 57 53 58  BILITOT 1.1 1.0 1.0 0.8 0.9  PROT 6.8 6.9 6.2* 6.3* 6.4*  ALBUMIN 2.8* 2.8* 2.5* 2.5* 2.6*   No results for input(s): LIPASE, AMYLASE in the last 168 hours. No results for input(s): AMMONIA in the last 168 hours. Coagulation Profile: No results for input(s): INR, PROTIME in the last 168 hours. Cardiac Enzymes: No results for input(s): CKTOTAL, CKMB, CKMBINDEX, TROPONINI in the last 168 hours. BNP (last 3 results) No results for input(s): PROBNP in the last 8760 hours. HbA1C: No results for input(s): HGBA1C in the  last 72 hours. CBG: No results for input(s): GLUCAP in the last 168 hours. Lipid Profile: No results for input(s): CHOL, HDL, LDLCALC, TRIG, CHOLHDL, LDLDIRECT in the last 72 hours. Thyroid Function Tests: No results for input(s): TSH, T4TOTAL, FREET4, T3FREE, THYROIDAB in the last 72 hours. Anemia Panel: Recent Labs    12/07/20 0402 12/08/20 0344  FERRITIN 601* 626*   Sepsis Labs: Recent Labs  Lab 12/04/20 1402  PROCALCITON 0.19    Recent Results (from the past 240 hour(s))  Resp Panel by RT-PCR (Flu A&B, Covid) Nasopharyngeal Swab     Status: Abnormal   Collection Time: 12/04/20  7:55 AM   Specimen: Nasopharyngeal Swab; Nasopharyngeal(NP) swabs  in vial transport medium  Result Value Ref Range Status   SARS Coronavirus 2 by RT PCR POSITIVE (A) NEGATIVE Final    Comment: RESULT CALLED TO, READ BACK BY AND VERIFIED WITH: RN E.BANKS ON [id number] AT 1045 BY E.PARRISH (NOTE) SARS-CoV-2 target nucleic acids are DETECTED.  The SARS-CoV-2 RNA is generally detectable in upper respiratory specimens during the acute phase of infection. Positive results are indicative of the presence of the identified virus, but do not rule out bacterial infection or co-infection with other pathogens not detected by the test. Clinical correlation with patient history and other diagnostic information is necessary to determine patient infection status. The expected result is Negative.  Fact Sheet for Patients: EntrepreneurPulse.com.au  Fact Sheet for Healthcare Providers: IncredibleEmployment.be  This test is not yet approved or cleared by the [place] FDA and  has been authorized for detection and/or diagnosis of SARS-CoV-2 by FDA under an Emergency Use Authorization (EUA).  This EUA will remain in effect (meaning this t est can be used) for the duration of  the COVID-19 declaration under Section 564(b)(1) of the Act, 21 U.S.C. section 360bbb-3(b)(1),  unless the authorization is terminated or revoked sooner.     Influenza A by PCR NEGATIVE NEGATIVE Final   Influenza B by PCR NEGATIVE NEGATIVE Final    Comment: (NOTE) The Xpert Xpress SARS-CoV-2/FLU/RSV plus assay is intended as an aid in the diagnosis of influenza from Nasopharyngeal swab specimens and should not be used as a sole basis for treatment. Nasal washings and aspirates are unacceptable for Xpert Xpress SARS-CoV-2/FLU/RSV testing.  Fact Sheet for Patients: EntrepreneurPulse.com.au  Fact Sheet for Healthcare Providers: IncredibleEmployment.be  This test is not yet approved or cleared by the [place] FDA and has been authorized for detection and/or diagnosis of SARS-CoV-2 by FDA under an Emergency Use Authorization (EUA). This EUA will remain in effect (meaning this test can be used) for the duration of the COVID-19 declaration under Section 564(b)(1) of the Act, 21 U.S.C. section 360bbb-3(b)(1), unless the authorization is terminated or revoked.  Performed at [facility], [place] [address] [place], [place] [address]          Radiology Studies: DG CHEST PORT 1 VIEW  Result Date: 12/07/2020 CLINICAL DATA:  Acute hypoxemic respiratory failure in the setting of COVID-7 in a 60 year old male. EXAM: PORTABLE CHEST 1 VIEW COMPARISON:  December 04, 2020. FINDINGS: Findings image rotated to the RIGHT. Accounting for rotation cardiomediastinal contours and hilar structures are stable. Lucency throughout the chest compatible with background pulmonary emphysema with increasing density at the RIGHT lung base compared to the prior study. Also with slight increased density at the LEFT lung base. No sign of pneumothorax. On limited assessment no acute skeletal process. IMPRESSION: Increasing density at the RIGHT lung base compared to the prior study and slight increased density at the LEFT lung base. Findings suspicious for worsening  multifocal pneumonia at the RIGHT lung base greater than LEFT. Pulmonary emphysema. Electronically Signed   By: Zetta Bills M.D.   On: 12/07/2020 13:26        Scheduled Meds:  allopurinol  100 mg Oral Daily   amLODipine  10 mg Oral Daily   vitamin C  500 mg Oral Daily   docusate sodium  100 mg Oral BID   enoxaparin (LOVENOX) injection  40 mg Subcutaneous Q24H   gabapentin  300 mg Oral QHS   losartan  50 mg Oral Daily   metoprolol succinate  50 mg Oral  Daily   mometasone-formoterol  2 puff Inhalation BID   predniSONE  50 mg Oral Daily   sodium chloride flush  3 mL Intravenous Q12H   sodium chloride flush  3 mL Intravenous Q12H   terbinafine   Topical Daily   triamcinolone ointment  1 application Topical BID   umeclidinium bromide  1 puff Inhalation Daily   zinc sulfate  220 mg Oral Daily   Continuous Infusions:  sodium chloride Stopped (12/05/20 1200)   remdesivir 100 mg in NS 100 mL Stopped (12/07/20 0935)     LOS: 4 days    Time spent: 35 mins,More than 50% of that time was spent in counseling and/or coordination of care.      Shelly Coss, MD [facility] P8/31/2022, 8:11 AM

## 2020-12-08 NOTE — Progress Notes (Signed)
Physical Therapy Treatment Patient Details Name: [name] [name] MRN: [id number] DOB: Aug 15, 1960 Today's Date: 12/08/2020    History of Present Illness Pt is a 60 y.o. male admitted 12/04/20 with SOB, cough, hypoxia. Workup for acute hypoxic respiratory failure secondary to COVID-19 PNA. PMH includes COPD (4L O2 baseline), HTN.    PT Comments    Pt supine in bed on entry with SaO2 81%O2, on 8L O2 via HFNC. Pt unable to recover oxygen saturation with purse lip breathing, came to sitting EoB and pt noted that the 30 foot line for going to the bathroom had been left on and he was not getting correct level of supplemental O2. With return to direct line to wall oxygen, and purse lip breathing SaO2 recovered however unable to maintain with activity. Limited to marching EoB and work on Chiropodist. Education on need to get up for meals. Pt reports he is moving as much as he can. D/c plan remains appropriate. PT will continue to follow acutely.     Follow Up Recommendations  No PT follow up     Equipment Recommendations   (TBD)       Precautions / Restrictions Precautions Precautions: Fall;Other (comment) Precaution Comments: Watch SpO2 (wears 4L O2 baseline) Restrictions Weight Bearing Restrictions: No    Mobility  Bed Mobility Overal bed mobility: Modified Independent Bed Mobility: Supine to Sit;Sit to Supine   Sidelying to sit: Supervision       General bed mobility comments: HOB elevated, use of bed rail    Transfers Overall transfer level: Needs assistance Equipment used: None (declined - would benefit) Transfers: Sit to/from Stand Sit to Stand: Min guard         General transfer comment: min guard for safety.  Ambulation/Gait             General Gait Details: deferred due to frequent IV occlusion with movement, and increased O2 demand today         Balance Overall balance assessment: Needs assistance Sitting-balance support: No upper extremity  supported;Feet supported Sitting balance-Leahy Scale: Good     Standing balance support: Bilateral upper extremity supported;During functional activity Standing balance-Leahy Scale: Poor Standing balance comment: requires at least one UE support during both dynamic and static standing activities                            Cognition Arousal/Alertness: Awake/alert Behavior During Therapy: WFL for tasks assessed/performed Overall Cognitive Status: Within Functional Limits for tasks assessed                                 General Comments: very motivated, wants to be independent         General Comments General comments (skin integrity, edema, etc.): On entry SaO2 81%O2, despite increased pursed lipped breathing, 30 ft line for mobility to bathroom still in place, when line shortened able to recover to 88%O2, however dropped to 83%O2 with marching in place for 45 sec, takes >5 min to recover to 89%O2,able to Worked on incentive spirometer, improved technique and maximum inhalation 1375. practiced one more bout of marching with similar results. Pt sitting EoB able to maintain SaO2 89%O2 at end of session      Pertinent Vitals/Pain Pain Assessment: No/denies pain     PT Goals (current goals can now be found in the care plan section) Acute Rehab PT Goals Patient  Stated Goal: to return to independence and prior level of activity tolerance PT Goal Formulation: With patient Time For Goal Achievement: 12/19/20 Potential to Achieve Goals: Good Progress towards PT goals: Not progressing toward goals - comment (limited by O2 desaturation)    Frequency    Min 3X/week      PT Plan Current plan remains appropriate       AM-PAC PT "6 Clicks" Mobility   Outcome Measure  Help needed turning from your back to your side while in a flat bed without using bedrails?: None Help needed moving from lying on your back to sitting on the side of a flat bed without using  bedrails?: None Help needed moving to and from a bed to a chair (including a wheelchair)?: None Help needed standing up from a chair using your arms (e.g., wheelchair or bedside chair)?: None Help needed to walk in hospital room?: A Little Help needed climbing 3-5 steps with a railing? : A Little 6 Click Score: 22    End of Session Equipment Utilized During Treatment: Oxygen Activity Tolerance: Patient limited by fatigue Patient left: in bed;with call bell/phone within reach Nurse Communication: Mobility status PT Visit Diagnosis: Other abnormalities of gait and mobility (R26.89)     Time: [id number] PT Time Calculation (min) (ACUTE ONLY): 29 min  Charges:  $Therapeutic Exercise: 8-22 mins $Therapeutic Activity: 8-22 mins                     [name] [name] B. Migdalia Dk PT, DPT Acute Rehabilitation Services Pager [phone] Office [phone]    [place] 12/08/2020, 3:07 PM

## 2020-12-09 ENCOUNTER — Inpatient Hospital Stay (HOSPITAL_COMMUNITY): Payer: Medicaid Other

## 2020-12-09 DIAGNOSIS — J439 Emphysema, unspecified: Secondary | ICD-10-CM | POA: Diagnosis not present

## 2020-12-09 LAB — CBC WITH DIFFERENTIAL/PLATELET
Abs Immature Granulocytes: 0.16 10*3/uL — ABNORMAL HIGH (ref 0.00–0.07)
Basophils Absolute: 0 10*3/uL (ref 0.0–0.1)
Basophils Relative: 0 %
Eosinophils Absolute: 0 10*3/uL (ref 0.0–0.5)
Eosinophils Relative: 0 %
HCT: 42.9 % (ref 39.0–52.0)
Hemoglobin: 12.9 g/dL — ABNORMAL LOW (ref 13.0–17.0)
Immature Granulocytes: 1 %
Lymphocytes Relative: 17 %
Lymphs Abs: 1.9 10*3/uL (ref 0.7–4.0)
MCH: 23.9 pg — ABNORMAL LOW (ref 26.0–34.0)
MCHC: 30.1 g/dL (ref 30.0–36.0)
MCV: 79.4 fL — ABNORMAL LOW (ref 80.0–100.0)
Monocytes Absolute: 1.7 10*3/uL — ABNORMAL HIGH (ref 0.1–1.0)
Monocytes Relative: 16 %
Neutro Abs: 7.3 10*3/uL (ref 1.7–7.7)
Neutrophils Relative %: 66 %
Platelets: 238 10*3/uL (ref 150–400)
RBC: 5.4 MIL/uL (ref 4.22–5.81)
RDW: 13.1 % (ref 11.5–15.5)
WBC: 11.1 10*3/uL — ABNORMAL HIGH (ref 4.0–10.5)
nRBC: 0 % (ref 0.0–0.2)

## 2020-12-09 LAB — FERRITIN: Ferritin: 445 ng/mL — ABNORMAL HIGH (ref 24–336)

## 2020-12-09 LAB — MAGNESIUM: Magnesium: 2.3 mg/dL (ref 1.7–2.4)

## 2020-12-09 LAB — COMPREHENSIVE METABOLIC PANEL
ALT: 103 U/L — ABNORMAL HIGH (ref 0–44)
AST: 36 U/L (ref 15–41)
Albumin: 2.3 g/dL — ABNORMAL LOW (ref 3.5–5.0)
Alkaline Phosphatase: 52 U/L (ref 38–126)
Anion gap: 6 (ref 5–15)
BUN: 22 mg/dL (ref 8–23)
CO2: 35 mmol/L — ABNORMAL HIGH (ref 22–32)
Calcium: 8 mg/dL — ABNORMAL LOW (ref 8.9–10.3)
Chloride: 97 mmol/L — ABNORMAL LOW (ref 98–111)
Creatinine, Ser: 1.06 mg/dL (ref 0.61–1.24)
GFR, Estimated: >60 mL/min (ref >60)
Glucose, Bld: 91 mg/dL (ref 70–99)
Potassium: 3.8 mmol/L (ref 3.5–5.1)
Sodium: 138 mmol/L (ref 135–145)
Total Bilirubin: 0.9 mg/dL (ref 0.3–1.2)
Total Protein: 5.8 g/dL — ABNORMAL LOW (ref 6.5–8.1)

## 2020-12-09 LAB — D-DIMER, QUANTITATIVE: D-Dimer, Quant: 0.4 ug/mL-FEU (ref 0.00–0.50)

## 2020-12-09 LAB — C-REACTIVE PROTEIN: CRP: 4 mg/dL — ABNORMAL HIGH (ref <1.0)

## 2020-12-09 LAB — PHOSPHORUS: Phosphorus: 2.7 mg/dL (ref 2.5–4.6)

## 2020-12-09 NOTE — Progress Notes (Signed)
PROGRESS NOTE    [name] [name]  [id number] DOB: Aug 29, 1960 DOA: 12/04/2020 PCP: Charlott Rakes, MD   Chief Complain: Worsening COVID   Brief Narrative:  Patient is a 60 year old male with history of COPD on oxygen at home at 4 L/min on, hypertension who presented with worsening COVID symptoms.  He was recently exposed to his grandson who had [place].  He called his PCP and was taking Paxilovid but that did not help.  So he presented to the emergency department.  Currently being managed for COVID-pneumonia.On 8 L high flow  Assessment & Plan:   Principal Problem:   Acute hypoxemic respiratory failure due to COVID-19 [facility]) Active Problems:   Tobacco abuse, in remission   Hypertension   COPD without exacerbation (HCC)   Acute on chronic hypoxic respiratory failure due to COVID pneumonia: Presented with shortness of breath, wheezing, cough.  Recently exposed to [place], taking Paxilovid.  Patient labs showed elevated LFTs, elevated LDH, elevated D-dimer, ferritin.  CXR  showed multiple opacities consistent with COVID-pneumonia.  Procalcitonin was nonreassuring.  Started on steroids, remdesivir( completed course).  Chest x-ray done on 12/07/2020 showed persistent pneumonia/possible worsening .Given a dose of Actemra on 12/08/20. He is saturating around 92-94% on 8 L of high flow oxygen , currently we are unable to lower the oxygen requirement.  We recommend frequent pronation as tolerated. Continue incentive spirometer as needed.  Inflammatory markers being monitored.  Checking follow-up chest x-ray today.  History of COPD: Fairly advanced.  On 4 L of oxygen at home.  Continue home Symbicort, Spiriva.  Continue incentive spirometer, flutter valve.  Patient is a past smoker, quit about a year ago  Leukocytosis: Most likely from steroids.  Continue to monitor  Gout: On allopurinol  Hypertension: On amlodipine, losartan, Toprol  Hyperlipidemia: On Zetia at home  Obesity: BMI of   37  Debility/deconditioning: Secondary to COVID.  PT/OT did not  recommended a follow up         DVT prophylaxis:Lovenox Code Status: Full Family Communication:Called and discussed with the daughter on phone on 8/29.  Patient said no need to call her daughter today Status is: Inpatient  Remains inpatient appropriate because:Inpatient level of care appropriate due to severity of illness  Dispo: The patient is from: Home              Anticipated d/c is to: Home              Patient currently is not medically stable to d/c.   Difficult to place patient No      Consultants: None  Procedures:None  Antimicrobials:  Anti-infectives (From admission, onward)    Start     Dose/Rate Route Frequency Ordered Stop   12/05/20 1000  remdesivir 100 mg in sodium chloride 0.9 % 100 mL IVPB       See Hyperspace for full Linked Orders Report.   100 mg 200 mL/hr over 30 Minutes Intravenous Daily 12/04/20 1248 12/08/20 1017   12/04/20 1500  remdesivir 200 mg in sodium chloride 0.9% 250 mL IVPB       See Hyperspace for full Linked Orders Report.   200 mg 580 mL/hr over 30 Minutes Intravenous Once 12/04/20 1248 12/04/20 1556       Subjective:  Patient seen and examined the bedside this morning.  On 8 L of high flow nasal cannula today also.  Unable to be weaned.  Patient feels better than yesterday.  Denies any worsening cough or shortness of breath.  He is moving around the room.  Denies any new complaints  Objective: Vitals:   12/08/20 1722 12/08/20 2105 12/09/20 0100 12/09/20 0601  BP: 117/82 137/89 129/71 (!) 135/95  Pulse: 84 92 65 78  Resp: 20 (!) 24 (!) 21 20  Temp: 98.7 F (37.1 C) 98.3 F (36.8 C) 98.3 F (36.8 C) 98.9 F (37.2 C)  TempSrc: Oral Oral Oral Oral  SpO2:  (!) 88% 94% 91%  Weight:    101.5 kg  Height:        Intake/Output Summary (Last 24 hours) at 12/09/2020 0800 Last data filed at 12/09/2020 0601 Gross per 24 hour  Intake 720 ml  Output 1300 ml  Net  -580 ml   Filed Weights   12/07/20 0400 12/08/20 0325 12/09/20 0601  Weight: 101.9 kg 101.2 kg 101.5 kg    Examination:  General exam: Overall comfortable, not in distress,obese HEENT: PERRL Respiratory system: Diminished air sounds bilaterally, no wheezes or crackles  Cardiovascular system: S1 & S2 heard, RRR.  Gastrointestinal system: Abdomen is nondistended, soft and nontender. Central nervous system: Alert and oriented Extremities: No edema, no clubbing ,no cyanosis Skin: No rashes, no ulcers,no icterus    Data Reviewed: I have personally reviewed following labs and imaging studies  CBC: Recent Labs  Lab 12/05/20 0405 12/06/20 0325 12/07/20 0402 12/08/20 0344 12/09/20 0357  WBC 4.7 8.1 10.6* 14.1* 11.1*  NEUTROABS 2.8 6.1 8.8* 10.7* 7.3  HGB 13.3 12.7* 13.4 13.9 12.9*  HCT 45.1 41.7 44.2 45.0 42.9  MCV 79.5* 78.4* 77.7* 77.9* 79.4*  PLT 165 159 208 233 [id number]   Basic Metabolic Panel: Recent Labs  Lab 12/05/20 0405 12/06/20 0325 12/07/20 0402 12/08/20 0344 12/09/20 0357  NA 135 136 137 137 138  K 4.3 4.3 4.4 4.2 3.8  CL 93* 94* 95* 96* 97*  CO2 34* 36* 36* 36* 35*  GLUCOSE 116* 117* 125* 117* 91  BUN '15 20 23 23 22  '$ CREATININE 1.16 1.17 1.12 1.15 1.06  CALCIUM 8.6* 8.4* 8.2* 8.6* 8.0*  MG 1.9 2.2 2.2 2.2 2.3  PHOS 3.4 3.5 3.6 4.0 2.7   GFR: Estimated Creatinine Clearance: 83.1 mL/min (by C-G formula based on SCr of 1.06 mg/dL). Liver Function Tests: Recent Labs  Lab 12/05/20 0405 12/06/20 0325 12/07/20 0402 12/08/20 0344 12/09/20 0357  AST 103* 76* 60* 64* 36  ALT 141* 113* 109* 135* 103*  ALKPHOS 64 57 53 58 52  BILITOT 1.0 1.0 0.8 0.9 0.9  PROT 6.9 6.2* 6.3* 6.4* 5.8*  ALBUMIN 2.8* 2.5* 2.5* 2.6* 2.3*   No results for input(s): LIPASE, AMYLASE in the last 168 hours. No results for input(s): AMMONIA in the last 168 hours. Coagulation Profile: No results for input(s): INR, PROTIME in the last 168 hours. Cardiac Enzymes: No results for  input(s): CKTOTAL, CKMB, CKMBINDEX, TROPONINI in the last 168 hours. BNP (last 3 results) No results for input(s): PROBNP in the last 8760 hours. HbA1C: No results for input(s): HGBA1C in the last 72 hours. CBG: No results for input(s): GLUCAP in the last 168 hours. Lipid Profile: No results for input(s): CHOL, HDL, LDLCALC, TRIG, CHOLHDL, LDLDIRECT in the last 72 hours. Thyroid Function Tests: No results for input(s): TSH, T4TOTAL, FREET4, T3FREE, THYROIDAB in the last 72 hours. Anemia Panel: Recent Labs    12/08/20 0344 12/09/20 0357  FERRITIN 626* 445*   Sepsis Labs: Recent Labs  Lab 12/04/20 1402  PROCALCITON 0.19    Recent Results (from the past 240 hour(s))  Resp Panel by RT-PCR (Flu A&B, Covid) Nasopharyngeal Swab     Status: Abnormal   Collection Time: 12/04/20  7:55 AM   Specimen: Nasopharyngeal Swab; Nasopharyngeal(NP) swabs in vial transport medium  Result Value Ref Range Status   SARS Coronavirus 2 by RT PCR POSITIVE (A) NEGATIVE Final    Comment: RESULT CALLED TO, READ BACK BY AND VERIFIED WITH: RN E.BANKS ON [id number] AT 1045 BY E.PARRISH (NOTE) SARS-CoV-2 target nucleic acids are DETECTED.  The SARS-CoV-2 RNA is generally detectable in upper respiratory specimens during the acute phase of infection. Positive results are indicative of the presence of the identified virus, but do not rule out bacterial infection or co-infection with other pathogens not detected by the test. Clinical correlation with patient history and other diagnostic information is necessary to determine patient infection status. The expected result is Negative.  Fact Sheet for Patients: EntrepreneurPulse.com.au  Fact Sheet for Healthcare Providers: IncredibleEmployment.be  This test is not yet approved or cleared by the [place] FDA and  has been authorized for detection and/or diagnosis of SARS-CoV-2 by FDA under an Emergency Use Authorization  (EUA).  This EUA will remain in effect (meaning this t est can be used) for the duration of  the COVID-19 declaration under Section 564(b)(1) of the Act, 21 U.S.C. section 360bbb-3(b)(1), unless the authorization is terminated or revoked sooner.     Influenza A by PCR NEGATIVE NEGATIVE Final   Influenza B by PCR NEGATIVE NEGATIVE Final    Comment: (NOTE) The Xpert Xpress SARS-CoV-2/FLU/RSV plus assay is intended as an aid in the diagnosis of influenza from Nasopharyngeal swab specimens and should not be used as a sole basis for treatment. Nasal washings and aspirates are unacceptable for Xpert Xpress SARS-CoV-2/FLU/RSV testing.  Fact Sheet for Patients: EntrepreneurPulse.com.au  Fact Sheet for Healthcare Providers: IncredibleEmployment.be  This test is not yet approved or cleared by the [place] FDA and has been authorized for detection and/or diagnosis of SARS-CoV-2 by FDA under an Emergency Use Authorization (EUA). This EUA will remain in effect (meaning this test can be used) for the duration of the COVID-19 declaration under Section 564(b)(1) of the Act, 21 U.S.C. section 360bbb-3(b)(1), unless the authorization is terminated or revoked.  Performed at [facility], [place] [address] [place], [place] [address]          Radiology Studies: DG CHEST PORT 1 VIEW  Result Date: 12/07/2020 CLINICAL DATA:  Acute hypoxemic respiratory failure in the setting of COVID-21 in a 60 year old male. EXAM: PORTABLE CHEST 1 VIEW COMPARISON:  December 04, 2020. FINDINGS: Findings image rotated to the RIGHT. Accounting for rotation cardiomediastinal contours and hilar structures are stable. Lucency throughout the chest compatible with background pulmonary emphysema with increasing density at the RIGHT lung base compared to the prior study. Also with slight increased density at the LEFT lung base. No sign of pneumothorax. On limited assessment no acute  skeletal process. IMPRESSION: Increasing density at the RIGHT lung base compared to the prior study and slight increased density at the LEFT lung base. Findings suspicious for worsening multifocal pneumonia at the RIGHT lung base greater than LEFT. Pulmonary emphysema. Electronically Signed   By: Zetta Bills M.D.   On: 12/07/2020 13:26        Scheduled Meds:  allopurinol  100 mg Oral Daily   amLODipine  10 mg Oral Daily   vitamin C  500 mg Oral Daily   docusate sodium  100 mg Oral BID   enoxaparin (LOVENOX)  injection  40 mg Subcutaneous Q24H   gabapentin  300 mg Oral QHS   losartan  50 mg Oral Daily   metoprolol succinate  50 mg Oral Daily   mometasone-formoterol  2 puff Inhalation BID   predniSONE  50 mg Oral Daily   sodium chloride flush  3 mL Intravenous Q12H   sodium chloride flush  3 mL Intravenous Q12H   terbinafine   Topical Daily   triamcinolone ointment  1 application Topical BID   umeclidinium bromide  1 puff Inhalation Daily   zinc sulfate  220 mg Oral Daily   Continuous Infusions:  sodium chloride Stopped (12/05/20 1200)     LOS: 5 days    Time spent: 35 mins,More than 50% of that time was spent in counseling and/or coordination of care.      Shelly Coss, MD [facility] P9/04/2020, 8:00 AM

## 2020-12-09 NOTE — Progress Notes (Signed)
Occupational Therapy Treatment Patient Details Name: [name] [name] MRN: [id number] DOB: [birth year] Today's Date: 12/09/2020    History of present illness Pt is a 60 y.o. male admitted 12/04/20 with SOB, cough, hypoxia. Workup for acute hypoxic respiratory failure secondary to COVID-19 PNA. PMH includes COPD (4L O2 baseline), HTN.   OT comments  Pt progressing towards OT goals this session. Focused on standing grooming/ADL task at sink. Pt able to maintain standing for approx 15 min (leaning forearms on sink) with active grooming tasks required increase from 8 to 10 L O2 via HFNC. With static standing able to maintain 90% on 8L. Pt very eager to continue working towards independence. Next session take theraband for Pt use in between therapy sessions.    Follow Up Recommendations  No OT follow up    Equipment Recommendations  None recommended by OT    Recommendations for Other Services      Precautions / Restrictions Precautions Precautions: Fall;Other (comment) Precaution Comments: Watch SpO2 (wears 4L O2 baseline)       Mobility Bed Mobility Overal bed mobility: Modified Independent Bed Mobility: Supine to Sit;Sit to Supine   Sidelying to sit: Supervision       General bed mobility comments: HOB elevated, use of bed rail    Transfers Overall transfer level: Needs assistance Equipment used: None Transfers: Sit to/from Stand Sit to Stand: Min guard         General transfer comment: min guard for safety.    Balance Overall balance assessment: Needs assistance Sitting-balance support: No upper extremity supported;Feet supported Sitting balance-Leahy Scale: Good     Standing balance support: Bilateral upper extremity supported;During functional activity Standing balance-Leahy Scale: Poor Standing balance comment: improved dyanamic balance today from previous session, continues to require BUE in static standing                           ADL either  performed or assessed with clinical judgement   ADL Overall ADL's : Needs assistance/impaired     Grooming: Supervision/safety;Standing;Wash/dry face;Oral care;Wash/dry hands Grooming Details (indicate cue type and reason): able to stand the entire time today leaninbg fwd with forearms on sink surface             Lower Body Dressing: Sit to/from stand;Min guard Lower Body Dressing Details (indicate cue type and reason): donned new socks Toilet Transfer: Min guard;Ambulation   Toileting- Clothing Manipulation and Hygiene: Sit to/from stand;Min guard Toileting - Clothing Manipulation Details (indicate cue type and reason): able to manage gown, underwear, and get himself clean     Functional mobility during ADLs: Min guard General ADL Comments: reinforced energy conservation strategies for ADL and to maximize safety and independence during recovery.     Vision   Vision Assessment?: No apparent visual deficits   Perception     Praxis      Cognition Arousal/Alertness: Awake/alert Behavior During Therapy: WFL for tasks assessed/performed Overall Cognitive Status: Within Functional Limits for tasks assessed                                 General Comments: very motivated, wants to be independent        Exercises Other Exercises Other Exercises: [employer] HEP handout (Access Code [id number]) provided - repeated sit<>stands, partial squats w/ UE support, standing calf raises w/ UE support, standing march   Shoulder Instructions  General Comments Pt initially on side with SpO2 90% on 8L via HFNC. With activity required 10L O2 via HFNC due to saturations in the 70's - with standing rest break of approx 10 min (leaning against sink) able to bring SpO2 to 92% on 8L. after return supine in bed, Pt left at 89% on 8L via HFNC    Pertinent Vitals/ Pain       Pain Assessment: No/denies pain  Home Living                                           Prior Functioning/Environment              Frequency  Min 2X/week        Progress Toward Goals  OT Goals(current goals can now be found in the care plan section)  Progress towards OT goals: Progressing toward goals  Acute Rehab OT Goals Patient Stated Goal: to return to independence and prior level of activity tolerance OT Goal Formulation: With patient Time For Goal Achievement: 12/19/20 Potential to Achieve Goals: Good ADL Goals Additional ADL Goal #1: pt will perform x6 mins of OOB ADL with encouragement to stand upright or sit for tasks with O2 sats  >90%, Additional ADL Goal #2: Pt will state 4 energy conservation strategies in order to increase ability activity tolerance.  Plan Discharge plan needs to be updated    Co-evaluation                 AM-PAC OT "6 Clicks" Daily Activity     Outcome Measure   Help from another person eating meals?: None Help from another person taking care of personal grooming?: A Little Help from another person toileting, which includes using toliet, bedpan, or urinal?: A Little Help from another person bathing (including washing, rinsing, drying)?: A Little Help from another person to put on and taking off regular upper body clothing?: None Help from another person to put on and taking off regular lower body clothing?: A Little 6 Click Score: 20    End of Session Equipment Utilized During Treatment: Oxygen (8L HFNC)  OT Visit Diagnosis: Unsteadiness on feet (R26.81);Muscle weakness (generalized) (M62.81)   Activity Tolerance Patient tolerated treatment well   Patient Left in bed;with call bell/phone within reach   Nurse Communication Mobility status        Time: [id number] OT Time Calculation (min): 29 min  Charges: OT General Charges $OT Visit: 1 Visit OT Treatments $Self Care/Home Management : 8-22 mins $Therapeutic Activity: 8-22 mins  Jesse Sans OTR/L Acute Rehabilitation Services Pager:  [phone] Office: [place] 12/09/2020, 1:05 PM

## 2020-12-09 NOTE — Plan of Care (Signed)
  Problem: Education: Goal: Knowledge of risk factors and measures for prevention of condition will improve Outcome: Progressing   Problem: Coping: Goal: Psychosocial and spiritual needs will be supported Outcome: Progressing   Problem: Respiratory: Goal: Will maintain a patent airway Outcome: Progressing Goal: Complications related to the disease process, condition or treatment will be avoided or minimized Outcome: Progressing   Problem: Education: Goal: Knowledge of disease or condition will improve Outcome: Progressing Goal: Knowledge of the prescribed therapeutic regimen will improve Outcome: Progressing Goal: Individualized Educational Video(s) Outcome: Progressing   Problem: Activity: Goal: Ability to tolerate increased activity will improve Outcome: Progressing Goal: Will verbalize the importance of balancing activity with adequate rest periods Outcome: Progressing   Problem: Respiratory: Goal: Ability to maintain a clear airway will improve Outcome: Progressing Goal: Levels of oxygenation will improve Outcome: Progressing Goal: Ability to maintain adequate ventilation will improve Outcome: Progressing

## 2020-12-10 LAB — COMPREHENSIVE METABOLIC PANEL
ALT: 114 U/L — ABNORMAL HIGH (ref 0–44)
AST: 44 U/L — ABNORMAL HIGH (ref 15–41)
Albumin: 2.3 g/dL — ABNORMAL LOW (ref 3.5–5.0)
Alkaline Phosphatase: 53 U/L (ref 38–126)
Anion gap: 3 — ABNORMAL LOW (ref 5–15)
BUN: 19 mg/dL (ref 8–23)
CO2: 36 mmol/L — ABNORMAL HIGH (ref 22–32)
Calcium: 8.3 mg/dL — ABNORMAL LOW (ref 8.9–10.3)
Chloride: 97 mmol/L — ABNORMAL LOW (ref 98–111)
Creatinine, Ser: 1.09 mg/dL (ref 0.61–1.24)
GFR, Estimated: >60 mL/min (ref >60)
Glucose, Bld: 102 mg/dL — ABNORMAL HIGH (ref 70–99)
Potassium: 4.1 mmol/L (ref 3.5–5.1)
Sodium: 136 mmol/L (ref 135–145)
Total Bilirubin: 0.8 mg/dL (ref 0.3–1.2)
Total Protein: 5.9 g/dL — ABNORMAL LOW (ref 6.5–8.1)

## 2020-12-10 LAB — CBC WITH DIFFERENTIAL/PLATELET
Abs Immature Granulocytes: 0.12 10*3/uL — ABNORMAL HIGH (ref 0.00–0.07)
Basophils Absolute: 0 10*3/uL (ref 0.0–0.1)
Basophils Relative: 0 %
Eosinophils Absolute: 0 10*3/uL (ref 0.0–0.5)
Eosinophils Relative: 0 %
HCT: 44.3 % (ref 39.0–52.0)
Hemoglobin: 13.5 g/dL (ref 13.0–17.0)
Immature Granulocytes: 1 %
Lymphocytes Relative: 16 %
Lymphs Abs: 1.5 10*3/uL (ref 0.7–4.0)
MCH: 24 pg — ABNORMAL LOW (ref 26.0–34.0)
MCHC: 30.5 g/dL (ref 30.0–36.0)
MCV: 78.7 fL — ABNORMAL LOW (ref 80.0–100.0)
Monocytes Absolute: 1.6 10*3/uL — ABNORMAL HIGH (ref 0.1–1.0)
Monocytes Relative: 17 %
Neutro Abs: 6 10*3/uL (ref 1.7–7.7)
Neutrophils Relative %: 66 %
Platelets: 268 10*3/uL (ref 150–400)
RBC: 5.63 MIL/uL (ref 4.22–5.81)
RDW: 13.1 % (ref 11.5–15.5)
WBC: 9.3 10*3/uL (ref 4.0–10.5)
nRBC: 0 % (ref 0.0–0.2)

## 2020-12-10 LAB — C-REACTIVE PROTEIN: CRP: 1.6 mg/dL — ABNORMAL HIGH (ref <1.0)

## 2020-12-10 NOTE — Progress Notes (Signed)
PROGRESS NOTE  [name] [name] [name] DOB: September 16, 1960 DOA: 12/04/2020 PCP: Charlott Rakes, MD  HPI/Recap of past 24 hours: This is a 60 year old male with history of COPD on chronic home O2 at 4 L/min and hypertension who presented with COVID symptoms he was recently exposed to [place] by his grandson.  He was admitted for COVID-pneumonia currently on 8 L/min oxygen  Subjective: December 10, 2020: Patient seen and examined at bedside he is sitting on the side of the bed comfortable no distress  Assessment/Plan: Principal Problem:   Acute hypoxemic respiratory failure due to COVID-19 [facility]) Active Problems:   Tobacco abuse, in remission   Hypertension   COPD without exacerbation ([place])  #1 acute on chronic hypoxic respiratory failure due to COVID-19 pneumonia He was given a dose of Actemra on December 08, 2020 Continue incentive spirometry Continue prednisone Still requiring high flow oxygen currently at 8 L failed attempt at lowering it.  He is chronically on 4 L at home  2.  History of COPD on 4 L home O2 Continue incentive spirometry and other management per home  3.  Obesity with BMI of 37  Hyperlipidemia on Zetia at home Hypertension on amlodipine and losartan and Toprol Gout on allopurinol Leukocytosis has resolved Code Status: Full  Severity of Illness: The appropriate patient status for this patient is INPATIENT. Inpatient status is judged to be reasonable and necessary in order to provide the required intensity of service to ensure the patient's safety. The patient's presenting symptoms, physical exam findings, and initial radiographic and laboratory data in the context of their chronic comorbidities is felt to place them at high risk for further clinical deterioration. Furthermore, it is not anticipated that the patient will be medically stable for discharge from the hospital within 2 midnights of admission. The following factors support the patient status of inpatient.    "  COVID-19 still requiring high flow oxygen   * I certify that at the point of admission it is my clinical judgment that the patient will require inpatient hospital care spanning beyond 2 midnights from the point of admission due to high intensity of service, high risk for further deterioration and high frequency of surveillance required.*   Family Communication: None at bedside  Disposition Plan: Stable Status is: Inpatient   Dispo: The patient is from: Home              Anticipated d/c is to:               Anticipated d/c date is:               Patient currently not medically stable for discharge  Consultants: None  Procedures: None  Antimicrobials: None  DVT prophylaxis: Lovenox   Objective: Vitals:   12/10/20 0800 12/10/20 1000 12/10/20 1013 12/10/20 1100  BP: 124/79     Pulse: 87     Resp: 18 20  (!) 22  Temp: 98.6 F (37 C)     TempSrc: Oral     SpO2: 92% 91% 92% 94%  Weight:      Height:        Intake/Output Summary (Last 24 hours) at 12/10/2020 2146 Last data filed at 12/10/2020 1716 Gross per 24 hour  Intake 1143 ml  Output 1695 ml  Net -552 ml   Filed Weights   12/08/20 0325 12/09/20 0601 12/10/20 0551  Weight: 101.2 kg 101.5 kg 100.9 kg   Body mass index is 34.83 kg/m.  Exam:  General:  60 y.o. year-old male well developed well nourished in no acute distress.  Alert and oriented x3.  Obese no distress Cardiovascular: Regular rate and rhythm with no rubs or gallops.  No thyromegaly or JVD noted.   Respiratory: Clear to auscultation with no wheezes or rales. Good inspiratory effort. Abdomen: Soft nontender nondistended with normal bowel sounds x4 quadrants. Musculoskeletal: No lower extremity edema. 2/4 pulses in all 4 extremities. Skin: No ulcerative lesions noted or rashes, Psychiatry: Mood is appropriate for condition and setting Neurology:    Data Reviewed: CBC: Recent Labs  Lab 12/06/20 0325 12/07/20 0402 12/08/20 0344  12/09/20 0357 12/10/20 0351  WBC 8.1 10.6* 14.1* 11.1* 9.3  NEUTROABS 6.1 8.8* 10.7* 7.3 6.0  HGB 12.7* 13.4 13.9 12.9* 13.5  HCT 41.7 44.2 45.0 42.9 44.3  MCV 78.4* 77.7* 77.9* 79.4* 78.7*  PLT 159 208 233 238 [id number]   Basic Metabolic Panel: Recent Labs  Lab 12/05/20 0405 12/06/20 0325 12/07/20 0402 12/08/20 0344 12/09/20 0357 12/10/20 0351  NA 135 136 137 137 138 136  K 4.3 4.3 4.4 4.2 3.8 4.1  CL 93* 94* 95* 96* 97* 97*  CO2 34* 36* 36* 36* 35* 36*  GLUCOSE 116* 117* 125* 117* 91 102*  BUN '15 20 23 23 22 19  '$ CREATININE 1.16 1.17 1.12 1.15 1.06 1.09  CALCIUM 8.6* 8.4* 8.2* 8.6* 8.0* 8.3*  MG 1.9 2.2 2.2 2.2 2.3  --   PHOS 3.4 3.5 3.6 4.0 2.7  --    GFR: Estimated Creatinine Clearance: 80.5 mL/min (by C-G formula based on SCr of 1.09 mg/dL). Liver Function Tests: Recent Labs  Lab 12/06/20 0325 12/07/20 0402 12/08/20 0344 12/09/20 0357 12/10/20 0351  AST 76* 60* 64* 36 44*  ALT 113* 109* 135* 103* 114*  ALKPHOS 57 53 58 52 53  BILITOT 1.0 0.8 0.9 0.9 0.8  PROT 6.2* 6.3* 6.4* 5.8* 5.9*  ALBUMIN 2.5* 2.5* 2.6* 2.3* 2.3*   No results for input(s): LIPASE, AMYLASE in the last 168 hours. No results for input(s): AMMONIA in the last 168 hours. Coagulation Profile: No results for input(s): INR, PROTIME in the last 168 hours. Cardiac Enzymes: No results for input(s): CKTOTAL, CKMB, CKMBINDEX, TROPONINI in the last 168 hours. BNP (last 3 results) No results for input(s): PROBNP in the last 8760 hours. HbA1C: No results for input(s): HGBA1C in the last 72 hours. CBG: No results for input(s): GLUCAP in the last 168 hours. Lipid Profile: No results for input(s): CHOL, HDL, LDLCALC, TRIG, CHOLHDL, LDLDIRECT in the last 72 hours. Thyroid Function Tests: No results for input(s): TSH, T4TOTAL, FREET4, T3FREE, THYROIDAB in the last 72 hours. Anemia Panel: Recent Labs    12/08/20 0344 12/09/20 0357  FERRITIN 626* 445*   Urine analysis:    Component Value Date/Time    COLORURINE YELLOW 10/30/2018 0730   APPEARANCEUR CLEAR 10/30/2018 0730   LABSPEC 1.013 10/30/2018 0730   PHURINE 6.0 10/30/2018 0730   GLUCOSEU NEGATIVE 10/30/2018 0730   HGBUR NEGATIVE 10/30/2018 0730   BILIRUBINUR NEGATIVE 10/30/2018 0730   KETONESUR NEGATIVE 10/30/2018 0730   PROTEINUR 30 (A) 10/30/2018 0730   UROBILINOGEN 0.2 12/11/2013 0951   NITRITE NEGATIVE 10/30/2018 0730   LEUKOCYTESUR NEGATIVE 10/30/2018 0730   Sepsis Labs: '@LABRCNTIP'$ (procalcitonin:4,lacticidven:4)  ) Recent Results (from the past 240 hour(s))  Resp Panel by RT-PCR (Flu A&B, Covid) Nasopharyngeal Swab     Status: Abnormal   Collection Time: 12/04/20  7:55 AM   Specimen: Nasopharyngeal Swab; Nasopharyngeal(NP) swabs in vial transport  medium  Result Value Ref Range Status   SARS Coronavirus 2 by RT PCR POSITIVE (A) NEGATIVE Final    Comment: RESULT CALLED TO, READ BACK BY AND VERIFIED WITH: RN E.BANKS ON [id number] AT 1045 BY E.PARRISH (NOTE) SARS-CoV-2 target nucleic acids are DETECTED.  The SARS-CoV-2 RNA is generally detectable in upper respiratory specimens during the acute phase of infection. Positive results are indicative of the presence of the identified virus, but do not rule out bacterial infection or co-infection with other pathogens not detected by the test. Clinical correlation with patient history and other diagnostic information is necessary to determine patient infection status. The expected result is Negative.  Fact Sheet for Patients: EntrepreneurPulse.com.au  Fact Sheet for Healthcare Providers: IncredibleEmployment.be  This test is not yet approved or cleared by the [place] FDA and  has been authorized for detection and/or diagnosis of SARS-CoV-2 by FDA under an Emergency Use Authorization (EUA).  This EUA will remain in effect (meaning this t est can be used) for the duration of  the COVID-19 declaration under Section 564(b)(1) of the  Act, 21 U.S.C. section 360bbb-3(b)(1), unless the authorization is terminated or revoked sooner.     Influenza A by PCR NEGATIVE NEGATIVE Final   Influenza B by PCR NEGATIVE NEGATIVE Final    Comment: (NOTE) The Xpert Xpress SARS-CoV-2/FLU/RSV plus assay is intended as an aid in the diagnosis of influenza from Nasopharyngeal swab specimens and should not be used as a sole basis for treatment. Nasal washings and aspirates are unacceptable for Xpert Xpress SARS-CoV-2/FLU/RSV testing.  Fact Sheet for Patients: EntrepreneurPulse.com.au  Fact Sheet for Healthcare Providers: IncredibleEmployment.be  This test is not yet approved or cleared by the [place] FDA and has been authorized for detection and/or diagnosis of SARS-CoV-2 by FDA under an Emergency Use Authorization (EUA). This EUA will remain in effect (meaning this test can be used) for the duration of the COVID-19 declaration under Section 564(b)(1) of the Act, 21 U.S.C. section 360bbb-3(b)(1), unless the authorization is terminated or revoked.  Performed at [facility], [place] [address] [place], [place] [address]       Studies: No results found.  Scheduled Meds:  allopurinol  100 mg Oral Daily   amLODipine  10 mg Oral Daily   vitamin C  500 mg Oral Daily   docusate sodium  100 mg Oral BID   enoxaparin (LOVENOX) injection  40 mg Subcutaneous Q24H   gabapentin  300 mg Oral QHS   losartan  50 mg Oral Daily   metoprolol succinate  50 mg Oral Daily   mometasone-formoterol  2 puff Inhalation BID   predniSONE  50 mg Oral Daily   sodium chloride flush  3 mL Intravenous Q12H   sodium chloride flush  3 mL Intravenous Q12H   terbinafine   Topical Daily   triamcinolone ointment  1 application Topical BID   umeclidinium bromide  1 puff Inhalation Daily   zinc sulfate  220 mg Oral Daily    Continuous Infusions:  sodium chloride Stopped (12/05/20 1200)     LOS: 6 days      Cristal Deer, MD [facility]  To reach me or the doctor on call, go to: [email or web address] Password [email or web address]  12/10/2020, 9:46 PM

## 2020-12-10 NOTE — Progress Notes (Signed)
Physical Therapy Treatment Patient Details Name: [name] [name] MRN: [id number] DOB: October 05, 1960 Today's Date: 12/10/2020    History of Present Illness Pt is a 60 y.o. male admitted 12/04/20 with SOB, cough, hypoxia. Workup for acute hypoxic respiratory failure secondary to COVID-19 PNA. PMH includes COPD (4L O2 baseline), HTN.    PT Comments    Pt remains very motivated to participate and improve. Provided pt with green and blue therabands and HEP for exercises using therabands for shoulder flexion and abduction, elbow flexion and extension, and hip abduction. Reviewed exercises. Pt performed standing exercises with standing rest breaks with SpO2 decreasing to as low as 86% but with pursed lip breathing and resting in standing with UE support it quickly rebounded to >/= 91% while on 6L O2 throughout the session. Will continue to follow acutely. Pt may benefit from a rollator initially upon d/c to allow him UE support and a place to sit when he fatigues as he tends to rely on resting flexed on his forearms when he becomes SOB. Current recommendations remain appropriate.     Follow Up Recommendations  No PT follow up     Equipment Recommendations   (TBD need for rollator)    Recommendations for Other Services       Precautions / Restrictions Precautions Precautions: Fall;Other (comment) Precaution Comments: Watch SpO2 (wears 4L O2 baseline) Restrictions Weight Bearing Restrictions: No    Mobility  Bed Mobility               General bed mobility comments: Pt sitting up EOB upon arrival.    Transfers Overall transfer level: Needs assistance Equipment used: None Transfers: Sit to/from Stand Sit to Stand: Supervision         General transfer comment: Supervision for safety, no LOB but extra time and effort to power up to stand.  Ambulation/Gait Ambulation/Gait assistance: Supervision Gait Distance (Feet): 5 Feet Assistive device: None Gait Pattern/deviations:  Step-through pattern Gait velocity: reduced Gait velocity interpretation: 1.31 - 2.62 ft/sec, indicative of limited community ambulator General Gait Details: Ambulated in small space in front of bed to perform exercises without LOB. Supervision for safety.   Stairs             Wheelchair Mobility    Modified Rankin (Stroke Patients Only)       Balance Overall balance assessment: Needs assistance Sitting-balance support: No upper extremity supported;Feet supported Sitting balance-Leahy Scale: Good     Standing balance support: Bilateral upper extremity supported;During functional activity;No upper extremity supported Standing balance-Leahy Scale: Fair Standing balance comment: Able to stand without UE support but prefers UE support for dynamic standing tasks, like exercises.                            Cognition Arousal/Alertness: Awake/alert Behavior During Therapy: WFL for tasks assessed/performed Overall Cognitive Status: Within Functional Limits for tasks assessed                                 General Comments: very motivated, wants to be independent      Exercises General Exercises - Lower Extremity Gluteal Sets: Strengthening;Both;20 reps;Standing (with UE support) Hip ABduction/ADduction: Strengthening;Both;10 reps;Standing (with UE support) Hip Flexion/Marching: AROM;Both;20 reps;Standing (with UE support) Other Exercises Other Exercises: [employer] HEP handout provided with exercises on using theraband for bil hip abduction, shoulder flexion, elbow flexion, elbow extension, and shoulder abduction Other Exercises:  Educated pt with him performing several reps of each of the following using the therabands, anchored by self: shoulder flexion, elbow flexion, elbow extension    General Comments General comments (skin integrity, edema, etc.): SpO2 as low as 86% on 6L with activity, but poor wave form. Quick rebound with pursed lip breathing  and standing or sitting resting on 6L up to >/= 91% with good waveform      Pertinent Vitals/Pain Pain Assessment: Faces Faces Pain Scale: No hurt Pain Intervention(s): Monitored during session    Home Living                      Prior Function            PT Goals (current goals can now be found in the care plan section) Acute Rehab PT Goals Patient Stated Goal: to return to independence and prior level of activity tolerance PT Goal Formulation: With patient Time For Goal Achievement: 12/19/20 Potential to Achieve Goals: Good Progress towards PT goals: Progressing toward goals    Frequency    Min 3X/week      PT Plan Current plan remains appropriate    Co-evaluation              AM-PAC PT "6 Clicks" Mobility   Outcome Measure  Help needed turning from your back to your side while in a flat bed without using bedrails?: None Help needed moving from lying on your back to sitting on the side of a flat bed without using bedrails?: None Help needed moving to and from a bed to a chair (including a wheelchair)?: A Little Help needed standing up from a chair using your arms (e.g., wheelchair or bedside chair)?: A Little Help needed to walk in hospital room?: A Little Help needed climbing 3-5 steps with a railing? : A Little 6 Click Score: 20    End of Session Equipment Utilized During Treatment: Oxygen Activity Tolerance: Patient limited by fatigue Patient left: Other (comment) (standing at sink, RN reported no need for alarms as pt is independent with mobility in room)   PT Visit Diagnosis: Other abnormalities of gait and mobility (R26.89);Unsteadiness on feet (R26.81);Muscle weakness (generalized) (M62.81);Difficulty in walking, not elsewhere classified (R26.2)     Time: [id number] PT Time Calculation (min) (ACUTE ONLY): 29 min  Charges:  $Therapeutic Exercise: 23-37 mins                     Moishe Spice, PT, DPT Acute Rehabilitation  Services  Pager: [phone] Office: [place] 12/10/2020, 5:52 PM

## 2020-12-11 LAB — CBC WITH DIFFERENTIAL/PLATELET
Abs Immature Granulocytes: 0.15 10*3/uL — ABNORMAL HIGH (ref 0.00–0.07)
Basophils Absolute: 0 10*3/uL (ref 0.0–0.1)
Basophils Relative: 0 %
Eosinophils Absolute: 0 10*3/uL (ref 0.0–0.5)
Eosinophils Relative: 0 %
HCT: 43.2 % (ref 39.0–52.0)
Hemoglobin: 13.3 g/dL (ref 13.0–17.0)
Immature Granulocytes: 2 %
Lymphocytes Relative: 17 %
Lymphs Abs: 1.5 10*3/uL (ref 0.7–4.0)
MCH: 24 pg — ABNORMAL LOW (ref 26.0–34.0)
MCHC: 30.8 g/dL (ref 30.0–36.0)
MCV: 78 fL — ABNORMAL LOW (ref 80.0–100.0)
Monocytes Absolute: 1.8 10*3/uL — ABNORMAL HIGH (ref 0.1–1.0)
Monocytes Relative: 21 %
Neutro Abs: 5.4 10*3/uL (ref 1.7–7.7)
Neutrophils Relative %: 60 %
Platelets: 294 10*3/uL (ref 150–400)
RBC: 5.54 MIL/uL (ref 4.22–5.81)
RDW: 13.2 % (ref 11.5–15.5)
WBC: 8.9 10*3/uL (ref 4.0–10.5)
nRBC: 0 % (ref 0.0–0.2)

## 2020-12-11 LAB — COMPREHENSIVE METABOLIC PANEL
ALT: 117 U/L — ABNORMAL HIGH (ref 0–44)
AST: 44 U/L — ABNORMAL HIGH (ref 15–41)
Albumin: 2.4 g/dL — ABNORMAL LOW (ref 3.5–5.0)
Alkaline Phosphatase: 52 U/L (ref 38–126)
Anion gap: 5 (ref 5–15)
BUN: 17 mg/dL (ref 8–23)
CO2: 37 mmol/L — ABNORMAL HIGH (ref 22–32)
Calcium: 8.5 mg/dL — ABNORMAL LOW (ref 8.9–10.3)
Chloride: 95 mmol/L — ABNORMAL LOW (ref 98–111)
Creatinine, Ser: 1.09 mg/dL (ref 0.61–1.24)
GFR, Estimated: >60 mL/min (ref >60)
Glucose, Bld: 97 mg/dL (ref 70–99)
Potassium: 4.3 mmol/L (ref 3.5–5.1)
Sodium: 137 mmol/L (ref 135–145)
Total Bilirubin: 0.6 mg/dL (ref 0.3–1.2)
Total Protein: 5.7 g/dL — ABNORMAL LOW (ref 6.5–8.1)

## 2020-12-11 LAB — C-REACTIVE PROTEIN: CRP: 0.7 mg/dL (ref <1.0)

## 2020-12-11 NOTE — Progress Notes (Signed)
PROGRESS NOTE  [name] [name] [id number] DOB: Aug 18, 1960 DOA: 12/04/2020 PCP: Charlott Rakes, MD  HPI/Recap of past 24 hours: This is a 60 year old male with history of COPD on chronic home O2 at 4 L/min and hypertension who presented with COVID symptoms he was recently exposed to [place] by his grandson.  He was admitted for COVID-pneumonia currently on 8 L/min oxygen  Subjective: December 10, 2020: Patient seen and examined at bedside, he is sitting on the side of the bed comfortable no distress   December 11, 2020 Patient seen and examined at bedside Is sitting on the side of the bed he still has some coughing He was weaned down to 6 L/min of oxygen  Assessment/Plan: Principal Problem:   Acute hypoxemic respiratory failure due to COVID-19 [facility]) Active Problems:   Tobacco abuse, in remission   Hypertension   COPD without exacerbation ([place])  #1 acute on chronic hypoxic respiratory failure due to COVID-19 pneumonia He was given a dose of Actemra on December 08, 2020 Continue incentive spirometry Continue prednisone Still requiring high flow oxygen currently at 8 L failed attempt at lowering it.  He is chronically on 4 L at home  2.  History of COPD on 4 L home O2 Continue incentive spirometry and other management per home  3.  Obesity with BMI of 37  Hyperlipidemia on Zetia at home Hypertension on amlodipine and losartan and Toprol Gout on allopurinol Leukocytosis has resolved Code Status: Full  Severity of Illness: The appropriate patient status for this patient is INPATIENT. Inpatient status is judged to be reasonable and necessary in order to provide the required intensity of service to ensure the patient's safety. The patient's presenting symptoms, physical exam findings, and initial radiographic and laboratory data in the context of their chronic comorbidities is felt to place them at high risk for further clinical deterioration. Furthermore, it is not anticipated that  the patient will be medically stable for discharge from the hospital within 2 midnights of admission. The following factors support the patient status of inpatient.   "  COVID-19 still requiring high flow oxygen   * I certify that at the point of admission it is my clinical judgment that the patient will require inpatient hospital care spanning beyond 2 midnights from the point of admission due to high intensity of service, high risk for further deterioration and high frequency of surveillance required.*   Family Communication: None at bedside  Disposition Plan: Stable Status is: Inpatient   Dispo: The patient is from: Home              Anticipated d/c is to:               Anticipated d/c date is:               Patient currently not medically stable for discharge  Consultants: None  Procedures: None  Antimicrobials: None  DVT prophylaxis: Lovenox   Objective: Vitals:   12/11/20 0700 12/11/20 0800 12/11/20 0900 12/11/20 1200  BP: 115/74 109/67 112/61 130/82  Pulse:      Resp:      Temp: 98.6 F (37 C)     TempSrc: Oral     SpO2: 92%   96%  Weight:      Height:        Intake/Output Summary (Last 24 hours) at 12/11/2020 1814 Last data filed at 12/11/2020 1300 Gross per 24 hour  Intake 480 ml  Output 1500 ml  Net -1020 ml  Filed Weights   12/09/20 0601 12/10/20 0551 12/11/20 0450  Weight: 101.5 kg 100.9 kg 100.8 kg   Body mass index is 34.82 kg/m.  Exam:  General: 60 y.o. year-old male well developed well nourished in no acute distress.  Alert and oriented x3.  Obese no distress Cardiovascular: Regular rate and rhythm with no rubs or gallops.  No thyromegaly or JVD noted.   Respiratory: Decreased air entry bilaterally good inspiratory effort. Abdomen: Soft nontender nondistended with normal bowel sounds x4 quadrants. Musculoskeletal: No lower extremity edema. 2/4 pulses in all 4 extremities. Skin: No ulcerative lesions noted or rashes, Psychiatry: Mood is  appropriate for condition and setting Neurology:    Data Reviewed: CBC: Recent Labs  Lab 12/07/20 0402 12/08/20 0344 12/09/20 0357 12/10/20 0351 12/11/20 0207  WBC 10.6* 14.1* 11.1* 9.3 8.9  NEUTROABS 8.8* 10.7* 7.3 6.0 5.4  HGB 13.4 13.9 12.9* 13.5 13.3  HCT 44.2 45.0 42.9 44.3 43.2  MCV 77.7* 77.9* 79.4* 78.7* 78.0*  PLT 208 233 238 268 [id number]    Basic Metabolic Panel: Recent Labs  Lab 12/05/20 0405 12/06/20 0325 12/07/20 0402 12/08/20 0344 12/09/20 0357 12/10/20 0351 12/11/20 0207  NA 135 136 137 137 138 136 137  K 4.3 4.3 4.4 4.2 3.8 4.1 4.3  CL 93* 94* 95* 96* 97* 97* 95*  CO2 34* 36* 36* 36* 35* 36* 37*  GLUCOSE 116* 117* 125* 117* 91 102* 97  BUN '15 20 23 23 22 19 17  '$ CREATININE 1.16 1.17 1.12 1.15 1.06 1.09 1.09  CALCIUM 8.6* 8.4* 8.2* 8.6* 8.0* 8.3* 8.5*  MG 1.9 2.2 2.2 2.2 2.3  --   --   PHOS 3.4 3.5 3.6 4.0 2.7  --   --     GFR: Estimated Creatinine Clearance: 80.5 mL/min (by C-G formula based on SCr of 1.09 mg/dL). Liver Function Tests: Recent Labs  Lab 12/07/20 0402 12/08/20 0344 12/09/20 0357 12/10/20 0351 12/11/20 0207  AST 60* 64* 36 44* 44*  ALT 109* 135* 103* 114* 117*  ALKPHOS 53 58 52 53 52  BILITOT 0.8 0.9 0.9 0.8 0.6  PROT 6.3* 6.4* 5.8* 5.9* 5.7*  ALBUMIN 2.5* 2.6* 2.3* 2.3* 2.4*    No results for input(s): LIPASE, AMYLASE in the last 168 hours. No results for input(s): AMMONIA in the last 168 hours. Coagulation Profile: No results for input(s): INR, PROTIME in the last 168 hours. Cardiac Enzymes: No results for input(s): CKTOTAL, CKMB, CKMBINDEX, TROPONINI in the last 168 hours. BNP (last 3 results) No results for input(s): PROBNP in the last 8760 hours. HbA1C: No results for input(s): HGBA1C in the last 72 hours. CBG: No results for input(s): GLUCAP in the last 168 hours. Lipid Profile: No results for input(s): CHOL, HDL, LDLCALC, TRIG, CHOLHDL, LDLDIRECT in the last 72 hours. Thyroid Function Tests: No results for  input(s): TSH, T4TOTAL, FREET4, T3FREE, THYROIDAB in the last 72 hours. Anemia Panel: Recent Labs    12/09/20 0357  FERRITIN 445*    Urine analysis:    Component Value Date/Time   COLORURINE YELLOW 10/30/2018 0730   APPEARANCEUR CLEAR 10/30/2018 0730   LABSPEC 1.013 10/30/2018 0730   PHURINE 6.0 10/30/2018 0730   GLUCOSEU NEGATIVE 10/30/2018 0730   HGBUR NEGATIVE 10/30/2018 0730   BILIRUBINUR NEGATIVE 10/30/2018 0730   KETONESUR NEGATIVE 10/30/2018 0730   PROTEINUR 30 (A) 10/30/2018 0730   UROBILINOGEN 0.2 12/11/2013 0951   NITRITE NEGATIVE 10/30/2018 0730   LEUKOCYTESUR NEGATIVE 10/30/2018 0730   Sepsis Labs: '@LABRCNTIP'$ (procalcitonin:4,lacticidven:4)  )  Recent Results (from the past 240 hour(s))  Resp Panel by RT-PCR (Flu A&B, Covid) Nasopharyngeal Swab     Status: Abnormal   Collection Time: 12/04/20  7:55 AM   Specimen: Nasopharyngeal Swab; Nasopharyngeal(NP) swabs in vial transport medium  Result Value Ref Range Status   SARS Coronavirus 2 by RT PCR POSITIVE (A) NEGATIVE Final    Comment: RESULT CALLED TO, READ BACK BY AND VERIFIED WITH: RN E.BANKS ON [id number] AT 1045 BY E.PARRISH (NOTE) SARS-CoV-2 target nucleic acids are DETECTED.  The SARS-CoV-2 RNA is generally detectable in upper respiratory specimens during the acute phase of infection. Positive results are indicative of the presence of the identified virus, but do not rule out bacterial infection or co-infection with other pathogens not detected by the test. Clinical correlation with patient history and other diagnostic information is necessary to determine patient infection status. The expected result is Negative.  Fact Sheet for Patients: EntrepreneurPulse.com.au  Fact Sheet for Healthcare Providers: IncredibleEmployment.be  This test is not yet approved or cleared by the [place] FDA and  has been authorized for detection and/or diagnosis of SARS-CoV-2 by FDA  under an Emergency Use Authorization (EUA).  This EUA will remain in effect (meaning this t est can be used) for the duration of  the COVID-19 declaration under Section 564(b)(1) of the Act, 21 U.S.C. section 360bbb-3(b)(1), unless the authorization is terminated or revoked sooner.     Influenza A by PCR NEGATIVE NEGATIVE Final   Influenza B by PCR NEGATIVE NEGATIVE Final    Comment: (NOTE) The Xpert Xpress SARS-CoV-2/FLU/RSV plus assay is intended as an aid in the diagnosis of influenza from Nasopharyngeal swab specimens and should not be used as a sole basis for treatment. Nasal washings and aspirates are unacceptable for Xpert Xpress SARS-CoV-2/FLU/RSV testing.  Fact Sheet for Patients: EntrepreneurPulse.com.au  Fact Sheet for Healthcare Providers: IncredibleEmployment.be  This test is not yet approved or cleared by the [place] FDA and has been authorized for detection and/or diagnosis of SARS-CoV-2 by FDA under an Emergency Use Authorization (EUA). This EUA will remain in effect (meaning this test can be used) for the duration of the COVID-19 declaration under Section 564(b)(1) of the Act, 21 U.S.C. section 360bbb-3(b)(1), unless the authorization is terminated or revoked.  Performed at [facility], [place] [address] [place], [place] [address]       Studies: No results found.  Scheduled Meds:  allopurinol  100 mg Oral Daily   amLODipine  10 mg Oral Daily   vitamin C  500 mg Oral Daily   docusate sodium  100 mg Oral BID   enoxaparin (LOVENOX) injection  40 mg Subcutaneous Q24H   gabapentin  300 mg Oral QHS   losartan  50 mg Oral Daily   metoprolol succinate  50 mg Oral Daily   mometasone-formoterol  2 puff Inhalation BID   predniSONE  50 mg Oral Daily   sodium chloride flush  3 mL Intravenous Q12H   sodium chloride flush  3 mL Intravenous Q12H   terbinafine   Topical Daily   triamcinolone ointment  1 application  Topical BID   umeclidinium bromide  1 puff Inhalation Daily   zinc sulfate  220 mg Oral Daily    Continuous Infusions:  sodium chloride Stopped (12/05/20 1200)     LOS: 7 days     Cristal Deer, MD [facility]  To reach me or the doctor on call, go to: [email or web address] Password [facility]  12/11/2020, 6:14 PM

## 2020-12-12 LAB — CBC WITH DIFFERENTIAL/PLATELET
Abs Immature Granulocytes: 0.1 10*3/uL — ABNORMAL HIGH (ref 0.00–0.07)
Basophils Absolute: 0 10*3/uL (ref 0.0–0.1)
Basophils Relative: 0 %
Eosinophils Absolute: 0 10*3/uL (ref 0.0–0.5)
Eosinophils Relative: 0 %
HCT: 43.7 % (ref 39.0–52.0)
Hemoglobin: 13.1 g/dL (ref 13.0–17.0)
Immature Granulocytes: 1 %
Lymphocytes Relative: 21 %
Lymphs Abs: 1.7 10*3/uL (ref 0.7–4.0)
MCH: 23.6 pg — ABNORMAL LOW (ref 26.0–34.0)
MCHC: 30 g/dL (ref 30.0–36.0)
MCV: 78.7 fL — ABNORMAL LOW (ref 80.0–100.0)
Monocytes Absolute: 1.8 10*3/uL — ABNORMAL HIGH (ref 0.1–1.0)
Monocytes Relative: 21 %
Neutro Abs: 4.6 10*3/uL (ref 1.7–7.7)
Neutrophils Relative %: 57 %
Platelets: 316 10*3/uL (ref 150–400)
RBC: 5.55 MIL/uL (ref 4.22–5.81)
RDW: 13.4 % (ref 11.5–15.5)
WBC: 8.2 10*3/uL (ref 4.0–10.5)
nRBC: 0 % (ref 0.0–0.2)

## 2020-12-12 LAB — COMPREHENSIVE METABOLIC PANEL
ALT: 109 U/L — ABNORMAL HIGH (ref 0–44)
AST: 38 U/L (ref 15–41)
Albumin: 2.4 g/dL — ABNORMAL LOW (ref 3.5–5.0)
Alkaline Phosphatase: 50 U/L (ref 38–126)
Anion gap: 9 (ref 5–15)
BUN: 18 mg/dL (ref 8–23)
CO2: 29 mmol/L (ref 22–32)
Calcium: 8.2 mg/dL — ABNORMAL LOW (ref 8.9–10.3)
Chloride: 98 mmol/L (ref 98–111)
Creatinine, Ser: 1.07 mg/dL (ref 0.61–1.24)
GFR, Estimated: >60 mL/min (ref >60)
Glucose, Bld: 109 mg/dL — ABNORMAL HIGH (ref 70–99)
Potassium: 3.8 mmol/L (ref 3.5–5.1)
Sodium: 136 mmol/L (ref 135–145)
Total Bilirubin: 0.9 mg/dL (ref 0.3–1.2)
Total Protein: 5.8 g/dL — ABNORMAL LOW (ref 6.5–8.1)

## 2020-12-12 LAB — C-REACTIVE PROTEIN: CRP: 0.6 mg/dL (ref <1.0)

## 2020-12-12 NOTE — Progress Notes (Signed)
PROGRESS NOTE  [name] [name] [id number] DOB: 06/21/1960 DOA: 12/04/2020 PCP: Charlott Rakes, MD  HPI/Recap of past 24 hours: This is a 60 year old male with history of COPD on chronic home O2 at 4 L/min and hypertension who presented with COVID symptoms he was recently exposed to [place] by his grandson.  He was admitted for COVID-pneumonia currently on 8 L/min oxygen  Subjective: December 10, 2020: Patient seen and examined at bedside, he is sitting on the side of the bed comfortable no distress   December 11, 2020 Patient seen and examined at bedside Is sitting on the side of the bed he still has some coughing He was weaned down to 6 L/min of oxygen  December 12, 2020 Patient seen and examined at bedside he is sitting at the side of the bed with a table leaning over the table.  Denies any shortness of breath.  He still on 6 L/min and coughing intermittently  Assessment/Plan: Principal Problem:   Acute hypoxemic respiratory failure due to COVID-19 [facility]) Active Problems:   Tobacco abuse, in remission   Hypertension   COPD without exacerbation ([place])  #1 acute on chronic hypoxic respiratory failure due to COVID-19 pneumonia He was given a dose of Actemra on December 08, 2020 Continue incentive spirometry Continue prednisone Still requiring high flow oxygen currently at 6 L failed attempt at lowering it.  He is chronically on 4 L at home we will continue to attempt to wean him down. Obtain chest x-ray  2.  History of COPD on 4 L home O2 Continue incentive spirometry and other management per home  3.  Obesity with BMI of 37  Hyperlipidemia on Zetia at home Hypertension on amlodipine and losartan and Toprol Gout on allopurinol Leukocytosis has resolved Code Status: Full  Severity of Illness: The appropriate patient status for this patient is INPATIENT. Inpatient status is judged to be reasonable and necessary in order to provide the required intensity of service to ensure  the patient's safety. The patient's presenting symptoms, physical exam findings, and initial radiographic and laboratory data in the context of their chronic comorbidities is felt to place them at high risk for further clinical deterioration. Furthermore, it is not anticipated that the patient will be medically stable for discharge from the hospital within 2 midnights of admission. The following factors support the patient status of inpatient.   "  COVID-19 still requiring high flow oxygen   * I certify that at the point of admission it is my clinical judgment that the patient will require inpatient hospital care spanning beyond 2 midnights from the point of admission due to high intensity of service, high risk for further deterioration and high frequency of surveillance required.*   Family Communication: None at bedside  Disposition Plan: Stable Status is: Inpatient   Dispo: The patient is from: Home              Anticipated d/c is to:               Anticipated d/c date is:               Patient currently not medically stable for discharge  Consultants: None  Procedures: None  Antimicrobials: None  DVT prophylaxis: Lovenox   Objective: Vitals:   12/12/20 0800 12/12/20 1200 12/12/20 1600 12/12/20 2012  BP: (!) 150/92 131/78 129/84   Pulse:  99  91  Resp:  20  18  Temp:  98.2 F (36.8 C)    TempSrc:  Oral    SpO2: 99% 92%  95%  Weight:      Height:        Intake/Output Summary (Last 24 hours) at 12/12/2020 2046 Last data filed at 12/12/2020 1718 Gross per 24 hour  Intake 960 ml  Output 1710 ml  Net -750 ml    Filed Weights   12/10/20 0551 12/11/20 0450 12/12/20 0212  Weight: 100.9 kg 100.8 kg 100 kg   Body mass index is 34.54 kg/m.  Exam:  General: 60 y.o. year-old male well developed well nourished in no acute distress.  Alert and oriented x3.  Obese no distress Cardiovascular: Regular rate and rhythm with no rubs or gallops.  No thyromegaly or JVD noted.    Respiratory: Improved air entry bilaterally good inspiratory effort. Abdomen: Soft nontender nondistended with normal bowel sounds x4 quadrants. Musculoskeletal: No lower extremity edema. 2/4 pulses in all 4 extremities. Skin: No ulcerative lesions noted or rashes, Psychiatry: Mood is appropriate for condition and setting Neurology:    Data Reviewed: CBC: Recent Labs  Lab 12/08/20 0344 12/09/20 0357 12/10/20 0351 12/11/20 0207 12/12/20 0254  WBC 14.1* 11.1* 9.3 8.9 8.2  NEUTROABS 10.7* 7.3 6.0 5.4 4.6  HGB 13.9 12.9* 13.5 13.3 13.1  HCT 45.0 42.9 44.3 43.2 43.7  MCV 77.9* 79.4* 78.7* 78.0* 78.7*  PLT 233 238 268 294 [id number]    Basic Metabolic Panel: Recent Labs  Lab 12/06/20 0325 12/07/20 0402 12/08/20 0344 12/09/20 0357 12/10/20 0351 12/11/20 0207 12/12/20 0254  NA 136 137 137 138 136 137 136  K 4.3 4.4 4.2 3.8 4.1 4.3 3.8  CL 94* 95* 96* 97* 97* 95* 98  CO2 36* 36* 36* 35* 36* 37* 29  GLUCOSE 117* 125* 117* 91 102* 97 109*  BUN '20 23 23 22 19 17 18  '$ CREATININE 1.17 1.12 1.15 1.06 1.09 1.09 1.07  CALCIUM 8.4* 8.2* 8.6* 8.0* 8.3* 8.5* 8.2*  MG 2.2 2.2 2.2 2.3  --   --   --   PHOS 3.5 3.6 4.0 2.7  --   --   --     GFR: Estimated Creatinine Clearance: 81.7 mL/min (by C-G formula based on SCr of 1.07 mg/dL). Liver Function Tests: Recent Labs  Lab 12/08/20 0344 12/09/20 0357 12/10/20 0351 12/11/20 0207 12/12/20 0254  AST 64* 36 44* 44* 38  ALT 135* 103* 114* 117* 109*  ALKPHOS 58 52 53 52 50  BILITOT 0.9 0.9 0.8 0.6 0.9  PROT 6.4* 5.8* 5.9* 5.7* 5.8*  ALBUMIN 2.6* 2.3* 2.3* 2.4* 2.4*    No results for input(s): LIPASE, AMYLASE in the last 168 hours. No results for input(s): AMMONIA in the last 168 hours. Coagulation Profile: No results for input(s): INR, PROTIME in the last 168 hours. Cardiac Enzymes: No results for input(s): CKTOTAL, CKMB, CKMBINDEX, TROPONINI in the last 168 hours. BNP (last 3 results) No results for input(s): PROBNP in the last  8760 hours. HbA1C: No results for input(s): HGBA1C in the last 72 hours. CBG: No results for input(s): GLUCAP in the last 168 hours. Lipid Profile: No results for input(s): CHOL, HDL, LDLCALC, TRIG, CHOLHDL, LDLDIRECT in the last 72 hours. Thyroid Function Tests: No results for input(s): TSH, T4TOTAL, FREET4, T3FREE, THYROIDAB in the last 72 hours. Anemia Panel: No results for input(s): VITAMINB12, FOLATE, FERRITIN, TIBC, IRON, RETICCTPCT in the last 72 hours.  Urine analysis:    Component Value Date/Time   COLORURINE YELLOW 10/30/2018 0730   APPEARANCEUR CLEAR 10/30/2018 0730  LABSPEC 1.013 10/30/2018 0730   PHURINE 6.0 10/30/2018 0730   GLUCOSEU NEGATIVE 10/30/2018 0730   HGBUR NEGATIVE 10/30/2018 0730   BILIRUBINUR NEGATIVE 10/30/2018 0730   KETONESUR NEGATIVE 10/30/2018 0730   PROTEINUR 30 (A) 10/30/2018 0730   UROBILINOGEN 0.2 12/11/2013 0951   NITRITE NEGATIVE 10/30/2018 0730   LEUKOCYTESUR NEGATIVE 10/30/2018 0730   Sepsis Labs: '@LABRCNTIP'$ (procalcitonin:4,lacticidven:4)  ) Recent Results (from the past 240 hour(s))  Resp Panel by RT-PCR (Flu A&B, Covid) Nasopharyngeal Swab     Status: Abnormal   Collection Time: 12/04/20  7:55 AM   Specimen: Nasopharyngeal Swab; Nasopharyngeal(NP) swabs in vial transport medium  Result Value Ref Range Status   SARS Coronavirus 2 by RT PCR POSITIVE (A) NEGATIVE Final    Comment: RESULT CALLED TO, READ BACK BY AND VERIFIED WITH: RN E.BANKS ON [id number] AT 1045 BY E.PARRISH (NOTE) SARS-CoV-2 target nucleic acids are DETECTED.  The SARS-CoV-2 RNA is generally detectable in upper respiratory specimens during the acute phase of infection. Positive results are indicative of the presence of the identified virus, but do not rule out bacterial infection or co-infection with other pathogens not detected by the test. Clinical correlation with patient history and other diagnostic information is necessary to determine patient infection  status. The expected result is Negative.  Fact Sheet for Patients: EntrepreneurPulse.com.au  Fact Sheet for Healthcare Providers: IncredibleEmployment.be  This test is not yet approved or cleared by the [place] FDA and  has been authorized for detection and/or diagnosis of SARS-CoV-2 by FDA under an Emergency Use Authorization (EUA).  This EUA will remain in effect (meaning this t est can be used) for the duration of  the COVID-19 declaration under Section 564(b)(1) of the Act, 21 U.S.C. section 360bbb-3(b)(1), unless the authorization is terminated or revoked sooner.     Influenza A by PCR NEGATIVE NEGATIVE Final   Influenza B by PCR NEGATIVE NEGATIVE Final    Comment: (NOTE) The Xpert Xpress SARS-CoV-2/FLU/RSV plus assay is intended as an aid in the diagnosis of influenza from Nasopharyngeal swab specimens and should not be used as a sole basis for treatment. Nasal washings and aspirates are unacceptable for Xpert Xpress SARS-CoV-2/FLU/RSV testing.  Fact Sheet for Patients: EntrepreneurPulse.com.au  Fact Sheet for Healthcare Providers: IncredibleEmployment.be  This test is not yet approved or cleared by the [place] FDA and has been authorized for detection and/or diagnosis of SARS-CoV-2 by FDA under an Emergency Use Authorization (EUA). This EUA will remain in effect (meaning this test can be used) for the duration of the COVID-19 declaration under Section 564(b)(1) of the Act, 21 U.S.C. section 360bbb-3(b)(1), unless the authorization is terminated or revoked.  Performed at [facility], [place] [address], [place], [place] [address]       Studies: No results found.  Scheduled Meds:  allopurinol  100 mg Oral Daily   amLODipine  10 mg Oral Daily   vitamin C  500 mg Oral Daily   docusate sodium  100 mg Oral BID   enoxaparin (LOVENOX) injection  40 mg Subcutaneous Q24H    gabapentin  300 mg Oral QHS   losartan  50 mg Oral Daily   metoprolol succinate  50 mg Oral Daily   mometasone-formoterol  2 puff Inhalation BID   predniSONE  50 mg Oral Daily   sodium chloride flush  3 mL Intravenous Q12H   sodium chloride flush  3 mL Intravenous Q12H   terbinafine   Topical Daily   triamcinolone ointment  1  application Topical BID   umeclidinium bromide  1 puff Inhalation Daily   zinc sulfate  220 mg Oral Daily    Continuous Infusions:  sodium chloride Stopped (12/05/20 1200)     LOS: 8 days     Cristal Deer, MD [facility]  To reach me or the doctor on call, go to: [email or web address] Password [facility]  12/12/2020, 8:46 PM

## 2020-12-13 ENCOUNTER — Inpatient Hospital Stay (HOSPITAL_COMMUNITY): Payer: Medicaid Other

## 2020-12-13 DIAGNOSIS — F17201 Nicotine dependence, unspecified, in remission: Secondary | ICD-10-CM

## 2020-12-13 DIAGNOSIS — J1282 Pneumonia due to coronavirus disease 2019: Secondary | ICD-10-CM | POA: Diagnosis not present

## 2020-12-13 DIAGNOSIS — J449 Chronic obstructive pulmonary disease, unspecified: Secondary | ICD-10-CM

## 2020-12-13 DIAGNOSIS — I10 Essential (primary) hypertension: Secondary | ICD-10-CM

## 2020-12-13 DIAGNOSIS — U071 COVID-19: Secondary | ICD-10-CM | POA: Diagnosis not present

## 2020-12-13 DIAGNOSIS — J189 Pneumonia, unspecified organism: Secondary | ICD-10-CM | POA: Diagnosis not present

## 2020-12-13 LAB — CBC WITH DIFFERENTIAL/PLATELET
Abs Immature Granulocytes: 0.15 10*3/uL — ABNORMAL HIGH (ref 0.00–0.07)
Basophils Absolute: 0 10*3/uL (ref 0.0–0.1)
Basophils Relative: 0 %
Eosinophils Absolute: 0 10*3/uL (ref 0.0–0.5)
Eosinophils Relative: 0 %
HCT: 44 % (ref 39.0–52.0)
Hemoglobin: 13.1 g/dL (ref 13.0–17.0)
Immature Granulocytes: 2 %
Lymphocytes Relative: 18 %
Lymphs Abs: 1.6 10*3/uL (ref 0.7–4.0)
MCH: 23.6 pg — ABNORMAL LOW (ref 26.0–34.0)
MCHC: 29.8 g/dL — ABNORMAL LOW (ref 30.0–36.0)
MCV: 79.4 fL — ABNORMAL LOW (ref 80.0–100.0)
Monocytes Absolute: 1.5 10*3/uL — ABNORMAL HIGH (ref 0.1–1.0)
Monocytes Relative: 16 %
Neutro Abs: 5.7 10*3/uL (ref 1.7–7.7)
Neutrophils Relative %: 64 %
Platelets: 312 10*3/uL (ref 150–400)
RBC: 5.54 MIL/uL (ref 4.22–5.81)
RDW: 13.4 % (ref 11.5–15.5)
WBC: 9 10*3/uL (ref 4.0–10.5)
nRBC: 0 % (ref 0.0–0.2)

## 2020-12-13 LAB — C-REACTIVE PROTEIN: CRP: 0.5 mg/dL (ref <1.0)

## 2020-12-13 NOTE — Progress Notes (Signed)
Pt alert and oriented. Able to communicate needs. Coughs periodically, cough medications administered with some relief, not in any acute distress throughout the shift.

## 2020-12-13 NOTE — Progress Notes (Signed)
PROGRESS NOTE  [name] [name] [name] DOB: 05-06-1960 DOA: 12/04/2020 PCP: Charlott Rakes, MD  HPI/Recap of past 24 hours: Patient is a 60 years old male with history of COPD on chronic home oxygen at 4 L/min and hypertension presented to hospital with complaints of worsening shortness of breath wheezing and cough.  Patient was diagnosed of having COVID prior to coming to the hospital but his symptoms had worsened so he decided to come to the hospital.  Had received Paxlovid as outpatient with and was given Decadron on admission.  Assessment/Plan: Principal Problem:   Acute hypoxemic respiratory failure due to COVID-19 [facility]) Active Problems:   Tobacco abuse, in remission   Hypertension   COPD without exacerbation ([place])  Acute on chronic hypoxic respiratory failure due to COVID-19 pneumonia Patient had received Paxlovid with as outpatient and received Actemra on December 08, 2020.  Currently on prednisone.  Continue incentive spirometry.  Requiring 6 L of oxygen by nasal cannula.  Patient will likely need higher level of oxygen on discharge.   History of COPD on 4 L home O2 Continue incentive spirometry, bronchodilators, prednisone.  Obesity with BMI of 37 Patient would benefit from weight loss as outpatient  Hyperlipidemia  on Zetia  Essential hypertension  Continue amlodipine losartan and Toprol.    Gout  Continue allopurinol  Leukocytosis Resolved  Code Status: Full code  Family Communication:  None   Disposition Plan:  Status is: Inpatient   Dispo: The patient is from: Home              Anticipated d/c is to: Home              Anticipated d/c date is: Likely tomorrow with oxygen              Patient currently not medically stable for discharge  Consultants: None  Procedures: None  Antimicrobials: None  DVT prophylaxis: Lovenox  Subjective Today, patient was seen and examined at bedside.  Continues to feel little better but not at his baseline.   Still requiring up to 6 L of oxygen.  Has dyspnea on exertion.  Objective: Vitals:   12/13/20 0007 12/13/20 0400 12/13/20 0741 12/13/20 1051  BP:  130/87  (!) 142/84  Pulse:   99 90  Resp:  19  20  Temp:  98.3 F (36.8 C) 98.3 F (36.8 C) 98.5 F (36.9 C)  TempSrc:  Oral Oral Oral  SpO2:  95% 96% 95%  Weight: 100.2 kg     Height:        Intake/Output Summary (Last 24 hours) at 12/13/2020 1325 Last data filed at 12/13/2020 1200 Gross per 24 hour  Intake 1080 ml  Output 1575 ml  Net -495 ml    Filed Weights   12/11/20 0450 12/12/20 0212 12/13/20 0007  Weight: 100.8 kg 100 kg 100.2 kg   Body mass index is 34.58 kg/m.  Physical exam: General: Obese built, not in obvious distress, on nasal cannula oxygen HENT:   No scleral pallor or icterus noted. Oral mucosa is moist.  Chest:    Diminished breath sounds bilaterally.  CVS: S1 &S2 heard. No murmur.  Regular rate and rhythm. Abdomen: Soft, nontender, nondistended.  Bowel sounds are heard.   Extremities: No cyanosis, clubbing or edema.  Peripheral pulses are palpable. Psych: Alert, awake and oriented, normal mood CNS:  No cranial nerve deficits.  Power equal in all extremities.   Skin: Warm and dry.  No rashes noted.  Data Reviewed:  CBC: Recent Labs  Lab 12/09/20 0357 12/10/20 0351 12/11/20 0207 12/12/20 0254 12/13/20 0148  WBC 11.1* 9.3 8.9 8.2 9.0  NEUTROABS 7.3 6.0 5.4 4.6 5.7  HGB 12.9* 13.5 13.3 13.1 13.1  HCT 42.9 44.3 43.2 43.7 44.0  MCV 79.4* 78.7* 78.0* 78.7* 79.4*  PLT 238 268 294 316 [id number]    Basic Metabolic Panel: Recent Labs  Lab 12/07/20 0402 12/08/20 0344 12/09/20 0357 12/10/20 0351 12/11/20 0207 12/12/20 0254  NA 137 137 138 136 137 136  K 4.4 4.2 3.8 4.1 4.3 3.8  CL 95* 96* 97* 97* 95* 98  CO2 36* 36* 35* 36* 37* 29  GLUCOSE 125* 117* 91 102* 97 109*  BUN '23 23 22 19 17 18  '$ CREATININE 1.12 1.15 1.06 1.09 1.09 1.07  CALCIUM 8.2* 8.6* 8.0* 8.3* 8.5* 8.2*  MG 2.2 2.2 2.3  --   --   --    PHOS 3.6 4.0 2.7  --   --   --     GFR: Estimated Creatinine Clearance: 81.7 mL/min (by C-G formula based on SCr of 1.07 mg/dL). Liver Function Tests: Recent Labs  Lab 12/08/20 0344 12/09/20 0357 12/10/20 0351 12/11/20 0207 12/12/20 0254  AST 64* 36 44* 44* 38  ALT 135* 103* 114* 117* 109*  ALKPHOS 58 52 53 52 50  BILITOT 0.9 0.9 0.8 0.6 0.9  PROT 6.4* 5.8* 5.9* 5.7* 5.8*  ALBUMIN 2.6* 2.3* 2.3* 2.4* 2.4*    No results for input(s): LIPASE, AMYLASE in the last 168 hours. No results for input(s): AMMONIA in the last 168 hours. Coagulation Profile: No results for input(s): INR, PROTIME in the last 168 hours. Cardiac Enzymes: No results for input(s): CKTOTAL, CKMB, CKMBINDEX, TROPONINI in the last 168 hours. BNP (last 3 results) No results for input(s): PROBNP in the last 8760 hours. HbA1C: No results for input(s): HGBA1C in the last 72 hours. CBG: No results for input(s): GLUCAP in the last 168 hours. Lipid Profile: No results for input(s): CHOL, HDL, LDLCALC, TRIG, CHOLHDL, LDLDIRECT in the last 72 hours. Thyroid Function Tests: No results for input(s): TSH, T4TOTAL, FREET4, T3FREE, THYROIDAB in the last 72 hours. Anemia Panel: No results for input(s): VITAMINB12, FOLATE, FERRITIN, TIBC, IRON, RETICCTPCT in the last 72 hours.  Urine analysis:    Component Value Date/Time   COLORURINE YELLOW 10/30/2018 0730   APPEARANCEUR CLEAR 10/30/2018 0730   LABSPEC 1.013 10/30/2018 0730   PHURINE 6.0 10/30/2018 0730   GLUCOSEU NEGATIVE 10/30/2018 0730   HGBUR NEGATIVE 10/30/2018 0730   BILIRUBINUR NEGATIVE 10/30/2018 0730   KETONESUR NEGATIVE 10/30/2018 0730   PROTEINUR 30 (A) 10/30/2018 0730   UROBILINOGEN 0.2 12/11/2013 0951   NITRITE NEGATIVE 10/30/2018 0730   LEUKOCYTESUR NEGATIVE 10/30/2018 0730   Sepsis Labs: '@LABRCNTIP'$ (procalcitonin:4,lacticidven:4)  ) Recent Results (from the past 240 hour(s))  Resp Panel by RT-PCR (Flu A&B, Covid) Nasopharyngeal Swab      Status: Abnormal   Collection Time: 12/04/20  7:55 AM   Specimen: Nasopharyngeal Swab; Nasopharyngeal(NP) swabs in vial transport medium  Result Value Ref Range Status   SARS Coronavirus 2 by RT PCR POSITIVE (A) NEGATIVE Final    Comment: RESULT CALLED TO, READ BACK BY AND VERIFIED WITH: RN E.BANKS ON [id number] AT 1045 BY E.PARRISH (NOTE) SARS-CoV-2 target nucleic acids are DETECTED.  The SARS-CoV-2 RNA is generally detectable in upper respiratory specimens during the acute phase of infection. Positive results are indicative of the presence of the identified virus, but do not rule out bacterial  infection or co-infection with other pathogens not detected by the test. Clinical correlation with patient history and other diagnostic information is necessary to determine patient infection status. The expected result is Negative.  Fact Sheet for Patients: EntrepreneurPulse.com.au  Fact Sheet for Healthcare Providers: IncredibleEmployment.be  This test is not yet approved or cleared by the [place] FDA and  has been authorized for detection and/or diagnosis of SARS-CoV-2 by FDA under an Emergency Use Authorization (EUA).  This EUA will remain in effect (meaning this t est can be used) for the duration of  the COVID-19 declaration under Section 564(b)(1) of the Act, 21 U.S.C. section 360bbb-3(b)(1), unless the authorization is terminated or revoked sooner.     Influenza A by PCR NEGATIVE NEGATIVE Final   Influenza B by PCR NEGATIVE NEGATIVE Final    Comment: (NOTE) The Xpert Xpress SARS-CoV-2/FLU/RSV plus assay is intended as an aid in the diagnosis of influenza from Nasopharyngeal swab specimens and should not be used as a sole basis for treatment. Nasal washings and aspirates are unacceptable for Xpert Xpress SARS-CoV-2/FLU/RSV testing.  Fact Sheet for Patients: EntrepreneurPulse.com.au  Fact Sheet for Healthcare  Providers: IncredibleEmployment.be  This test is not yet approved or cleared by the [place] FDA and has been authorized for detection and/or diagnosis of SARS-CoV-2 by FDA under an Emergency Use Authorization (EUA). This EUA will remain in effect (meaning this test can be used) for the duration of the COVID-19 declaration under Section 564(b)(1) of the Act, 21 U.S.C. section 360bbb-3(b)(1), unless the authorization is terminated or revoked.  Performed at [facility], [place] [address], [place], [place] [address]       Studies: DG Chest Port 1 View  Result Date: 12/13/2020 CLINICAL DATA:  COVID-19 pneumonia. EXAM: PORTABLE CHEST 1 VIEW COMPARISON:  12/09/2020 FINDINGS: Stable heart size and aortic tortuosity. Slight increase in bibasilar opacities which may be a combination of pneumonia and atelectasis. No overt edema, pneumothorax or pleural fluid identified. IMPRESSION: Increase in bibasilar opacities which may be a combination of pneumonia and atelectasis. Electronically Signed   By: Aletta Edouard M.D.   On: 12/13/2020 08:01    Scheduled Meds:  allopurinol  100 mg Oral Daily   amLODipine  10 mg Oral Daily   vitamin C  500 mg Oral Daily   docusate sodium  100 mg Oral BID   enoxaparin (LOVENOX) injection  40 mg Subcutaneous Q24H   gabapentin  300 mg Oral QHS   losartan  50 mg Oral Daily   metoprolol succinate  50 mg Oral Daily   mometasone-formoterol  2 puff Inhalation BID   predniSONE  50 mg Oral Daily   sodium chloride flush  3 mL Intravenous Q12H   sodium chloride flush  3 mL Intravenous Q12H   terbinafine   Topical Daily   triamcinolone ointment  1 application Topical BID   umeclidinium bromide  1 puff Inhalation Daily   zinc sulfate  220 mg Oral Daily    Continuous Infusions:  sodium chloride Stopped (12/05/20 1200)     LOS: 9 days    Flora Lipps, MD [facility] 12/13/2020, 1:25 PM

## 2020-12-14 ENCOUNTER — Other Ambulatory Visit: Payer: Self-pay

## 2020-12-14 LAB — CBC WITH DIFFERENTIAL/PLATELET
Abs Immature Granulocytes: 0.11 10*3/uL — ABNORMAL HIGH (ref 0.00–0.07)
Basophils Absolute: 0 10*3/uL (ref 0.0–0.1)
Basophils Relative: 0 %
Eosinophils Absolute: 0 10*3/uL (ref 0.0–0.5)
Eosinophils Relative: 0 %
HCT: 44.9 % (ref 39.0–52.0)
Hemoglobin: 13.8 g/dL (ref 13.0–17.0)
Immature Granulocytes: 1 %
Lymphocytes Relative: 28 %
Lymphs Abs: 3.1 10*3/uL (ref 0.7–4.0)
MCH: 24.1 pg — ABNORMAL LOW (ref 26.0–34.0)
MCHC: 30.7 g/dL (ref 30.0–36.0)
MCV: 78.5 fL — ABNORMAL LOW (ref 80.0–100.0)
Monocytes Absolute: 1.9 10*3/uL — ABNORMAL HIGH (ref 0.1–1.0)
Monocytes Relative: 17 %
Neutro Abs: 6 10*3/uL (ref 1.7–7.7)
Neutrophils Relative %: 54 %
Platelets: 319 10*3/uL (ref 150–400)
RBC: 5.72 MIL/uL (ref 4.22–5.81)
RDW: 13.6 % (ref 11.5–15.5)
WBC: 11.1 10*3/uL — ABNORMAL HIGH (ref 4.0–10.5)
nRBC: 0 % (ref 0.0–0.2)

## 2020-12-14 LAB — C-REACTIVE PROTEIN: CRP: 0.5 mg/dL (ref <1.0)

## 2020-12-14 MED ORDER — GUAIFENESIN-DM 100-10 MG/5ML PO SYRP: 10.0000 mL | ORAL_SOLUTION | ORAL | 0 refills | Status: DC | PRN

## 2020-12-14 NOTE — Progress Notes (Signed)
Physical Therapy Treatment Patient Details Name: [name] [name] MRN: [id number] DOB: 05-05-1960 Today's Date: 12/14/2020    History of Present Illness Pt is a 60 y.o. male admitted 12/04/20 with SOB, cough, hypoxia. Workup for acute hypoxic respiratory failure secondary to COVID-19 PNA. PMH includes COPD (4L O2 baseline), HTN.    PT Comments    Patient received sitting at EOB, pleasant and cooperative, surprised to see PT as he feels he is doing quite well. Able to mobilize in room really on a mod(I) basis with no device today, good safety awareness altho did need occasional assist navigating O2 line past hospital obstacles. Reports he feels great and back to baseline, very sure his children will take good care of him after DC. Discussed use of pulse ox, energy conservation techniques, and encouraged continuing with Covid HEP moving forward. Left sitting at EOB with all needs met, awaiting DC, RN aware of patient status.     Follow Up Recommendations  No PT follow up     Equipment Recommendations  None recommended by PT    Recommendations for Other Services       Precautions / Restrictions Precautions Precautions: Fall;Other (comment) Precaution Comments: Watch SpO2 (wears 4L O2 baseline) Restrictions Weight Bearing Restrictions: No    Mobility  Bed Mobility               General bed mobility comments: sitting at EOB upon arrival    Transfers Overall transfer level: Needs assistance Equipment used: None Transfers: Sit to/from Stand Sit to Stand: Modified independent (Device/Increase time)         General transfer comment: able to perform on modified independent basis- no LOB or unsteadiness, did need to push up from rail of bed  Ambulation/Gait Ambulation/Gait assistance: Modified independent (Device/Increase time) Gait Distance (Feet): 60 Feet Assistive device: None Gait Pattern/deviations: WFL(Within Functional Limits);Step-through pattern Gait velocity:  reduced   General Gait Details: able to gait train in room with mod(I), occasional assist for managing O2 line without difficulty. VSS on 6LPM O2.   Stairs             Wheelchair Mobility    Modified Rankin (Stroke Patients Only)       Balance Overall balance assessment: Needs assistance Sitting-balance support: No upper extremity supported;Feet supported Sitting balance-Leahy Scale: Good     Standing balance support: No upper extremity supported;During functional activity Standing balance-Leahy Scale: Good                              Cognition Arousal/Alertness: Awake/alert Behavior During Therapy: WFL for tasks assessed/performed Overall Cognitive Status: Within Functional Limits for tasks assessed                                 General Comments: very motivated, wants to be independent      Exercises      General Comments General comments (skin integrity, edema, etc.): VSS on 6LPM but still SOB      Pertinent Vitals/Pain Pain Assessment: Faces Faces Pain Scale: No hurt Pain Intervention(s): Limited activity within patient's tolerance;Monitored during session    Home Living                      Prior Function            PT Goals (current goals can now be found in the  care plan section) Acute Rehab PT Goals Patient Stated Goal: to return to independence and prior level of activity tolerance PT Goal Formulation: With patient Time For Goal Achievement: 12/19/20 Potential to Achieve Goals: Good Additional Goals Additional Goal #1: Pt will ambulate for >100' with DOE of less than 3/10 to demonstrate improvements in activity tolerance Progress towards PT goals: Progressing toward goals    Frequency    Min 3X/week      PT Plan Current plan remains appropriate    Co-evaluation              AM-PAC PT "6 Clicks" Mobility   Outcome Measure  Help needed turning from your back to your side while in a flat  bed without using bedrails?: None Help needed moving from lying on your back to sitting on the side of a flat bed without using bedrails?: None Help needed moving to and from a bed to a chair (including a wheelchair)?: None Help needed standing up from a chair using your arms (e.g., wheelchair or bedside chair)?: None Help needed to walk in hospital room?: None Help needed climbing 3-5 steps with a railing? : A Little 6 Click Score: 23    End of Session Equipment Utilized During Treatment: Oxygen Activity Tolerance: Patient tolerated treatment well Patient left: in bed;Other (comment) (sitting at EOB per his request) Nurse Communication: Mobility status PT Visit Diagnosis: Other abnormalities of gait and mobility (R26.89);Unsteadiness on feet (R26.81);Muscle weakness (generalized) (M62.81);Difficulty in walking, not elsewhere classified (R26.2)     Time: 8264-1583 PT Time Calculation (min) (ACUTE ONLY): 13 min  Charges:  $Gait Training: 8-22 mins                    Windell Norfolk, DPT, PN2   Supplemental Physical Therapist [place]    Pager [phone] Acute Rehab Office [phone]

## 2020-12-14 NOTE — TOC Transition Note (Signed)
Transition of Care [facility]) - CM/SW Discharge Note   Patient Details  Name: [name] [name] MRN: [id number] Date of Birth: 15-Feb-1961  [facility] CM/SW Contact:  [name] Mayo, RN Phone Number: 12/14/2020, 12:54 PM   Clinical Narrative:    Patient is for dc today, he is on 4 liters of oxygen at home and the concentrator will go to 10 liters, he is on  6 liters here in the hospital, has home oxygen with Zach with [employer].  Thedore Mins states he only needs the oxygen order.  MD has put the oxygen order in.     Final next level of care: Home/Self Care Barriers to Discharge: No Barriers Identified   Patient Goals and CMS Choice     Choice offered to / list presented to : NA  Discharge Placement                       Discharge Plan and Services                  DME Agency: NA       HH Arranged: NA          Social Determinants of Health (SDOH) Interventions     Readmission Risk Interventions No flowsheet data found.

## 2020-12-14 NOTE — Plan of Care (Signed)
  Problem: Education: Goal: Knowledge of risk factors and measures for prevention of condition will improve Outcome: Adequate for Discharge   Problem: Coping: Goal: Psychosocial and spiritual needs will be supported Outcome: Adequate for Discharge   Problem: Respiratory: Goal: Will maintain a patent airway Outcome: Adequate for Discharge Goal: Complications related to the disease process, condition or treatment will be avoided or minimized Outcome: Adequate for Discharge   Problem: Education: Goal: Knowledge of disease or condition will improve Outcome: Adequate for Discharge Goal: Knowledge of the prescribed therapeutic regimen will improve Outcome: Adequate for Discharge Goal: Individualized Educational Video(s) Outcome: Adequate for Discharge   Problem: Activity: Goal: Ability to tolerate increased activity will improve Outcome: Adequate for Discharge Goal: Will verbalize the importance of balancing activity with adequate rest periods Outcome: Adequate for Discharge   Problem: Respiratory: Goal: Ability to maintain a clear airway will improve Outcome: Adequate for Discharge Goal: Levels of oxygenation will improve Outcome: Adequate for Discharge Goal: Ability to maintain adequate ventilation will improve Outcome: Adequate for Discharge   Problem: Acute Rehab OT Goals (only OT should resolve) Goal: OT Additional ADL Goal #1 Outcome: Adequate for Discharge Goal: OT Additional ADL Goal #2 Outcome: Adequate for Discharge   Problem: Acute Rehab PT Goals(only PT should resolve) Goal: Pt Will Ambulate Outcome: Adequate for Discharge Goal: Pt Will Go Up/Down Stairs Outcome: Adequate for Discharge Goal: PT Additional Goal #1 Outcome: Adequate for Discharge

## 2020-12-14 NOTE — Discharge Summary (Signed)
Physician Discharge Summary  [name] [name] [name] DOB: 12-13-1960 DOA: 12/04/2020  PCP: Charlott Rakes, MD  Admit date: 12/04/2020 Discharge date: 12/14/2020  Admitted From: Home  Discharge disposition: Home   Recommendations for Outpatient Follow-Up:   Follow up with your primary care provider in one week.  Check CBC, BMP, magnesium in the next visit Patient has been prescribed 6 L of oxygen during rest and 10 L during exertion.  PCP to adjust oxygen needs on the next visit.   Discharge Diagnosis:   Principal Problem:   Acute hypoxemic respiratory failure due to COVID-19 [name]) Active Problems:   Tobacco abuse, in remission   Hypertension   COPD without exacerbation ([place])   Discharge Condition: Improved.  Diet recommendation: Low sodium, heart healthy.    Wound care: None.  Code status: Full.   History of Present Illness:   Patient is a 60 years old male with history of COPD on chronic home oxygen at 4 L/min and hypertension presented to hospital with complaints of worsening shortness of breath, wheezing and cough.  Patient was diagnosed of having COVID prior to coming to the hospital but his symptoms had worsened, so he had decided to come to the hospital.  Had received Paxlovid as outpatient with and was given Decadron on admission.   Hospital Course:   Following conditions were addressed during hospitalization as listed below,  Acute on chronic hypoxic respiratory failure due to COVID-19 pneumonia Patient had received Paxlovid with as outpatient and received Actemra on December 08, 2020.  Initially received IV steroid followed by prednisone p.o.  Patient takes prednisone chronically at home which will be continued on discharge.  Requiring 6 L of oxygen by nasal cannula at this time and a new DME order will be placed in.  History of COPD on 4 L home O2 Currently requiring 6 L of oxygen.  Will need to follow-up with his primary care physician as outpatient to  adjust oxygen.  Continue bronchodilators and prednisone from home.  Obesity with BMI of 37 Patient would benefit from weight loss as outpatient  Hyperlipidemia  on Zetia  Essential hypertension  Continue amlodipine losartan and Toprol.     Gout  Continue allopurinol  Leukocytosis Resolved  Disposition.  At this time, patient is stable for disposition home with outpatient PCP follow-up.  Medical Consultants:   None.  Procedures:    None Subjective:   Today, patient was seen and examined at bedside.  Patient feels at his baseline.  Denies any increasing shortness of breath, cough, chest pain fever or chills.  Discharge Exam:   Vitals:   12/14/20 0900 12/14/20 1000  BP:    Pulse:    Resp: 18 (!) 22  Temp:    SpO2: 95% 90%   Vitals:   12/14/20 0754 12/14/20 0800 12/14/20 0900 12/14/20 1000  BP: (!) 130/94 (!) 134/93    Pulse: 86     Resp: 20 (!) 26 18 (!) 22  Temp: 98.2 F (36.8 C)     TempSrc: Oral     SpO2:  96% 95% 90%  Weight:      Height:        General: Alert awake, not in obvious distress, on nasal cannula oxygen at 6 L/min.  Obese built HENT: pupils equally reacting to light,  No scleral pallor or icterus noted. Oral mucosa is moist.  Chest: .  Diminished breath sounds bilaterally. CVS: S1 &S2 heard. No murmur.  Regular rate and rhythm. Abdomen: Soft, nontender,  nondistended.  Bowel sounds are heard.   Extremities: No cyanosis, clubbing or edema.  Peripheral pulses are palpable. Psych: Alert, awake and oriented, normal mood CNS:  No cranial nerve deficits.  Power equal in all extremities.   Skin: Warm and dry.  No rashes noted.  The results of significant diagnostics from this hospitalization (including imaging, microbiology, ancillary and laboratory) are listed below for reference.     Diagnostic Studies:   DG Chest Port 1 View  Result Date: 12/04/2020 CLINICAL DATA:  Shortness of breath. EXAM: PORTABLE CHEST 1 VIEW COMPARISON:  CT chest  10/30/2018. FINDINGS: Normal heart size. Tortuous aorta. Diffuse chronic interstitial changes of COPD/emphysema noted. Increased interstitial opacities noted within both lung bases, left greater than right. Upper lobes appear clear. Visualized osseous structures are unremarkable. IMPRESSION: 1. Increased interstitial opacities within both lung bases, left greater than right. Findings may represent atypical infection versus edema. 2. Emphysema. Electronically Signed   By: Kerby Moors M.D.   On: 12/04/2020 08:02     Labs:   Basic Metabolic Panel: Recent Labs  Lab 12/08/20 0344 12/09/20 0357 12/10/20 0351 12/11/20 0207 12/12/20 0254  NA 137 138 136 137 136  K 4.2 3.8 4.1 4.3 3.8  CL 96* 97* 97* 95* 98  CO2 36* 35* 36* 37* 29  GLUCOSE 117* 91 102* 97 109*  BUN '23 22 19 17 18  '$ CREATININE 1.15 1.06 1.09 1.09 1.07  CALCIUM 8.6* 8.0* 8.3* 8.5* 8.2*  MG 2.2 2.3  --   --   --   PHOS 4.0 2.7  --   --   --    GFR Estimated Creatinine Clearance: 81.7 mL/min (by C-G formula based on SCr of 1.07 mg/dL). Liver Function Tests: Recent Labs  Lab 12/08/20 0344 12/09/20 0357 12/10/20 0351 12/11/20 0207 12/12/20 0254  AST 64* 36 44* 44* 38  ALT 135* 103* 114* 117* 109*  ALKPHOS 58 52 53 52 50  BILITOT 0.9 0.9 0.8 0.6 0.9  PROT 6.4* 5.8* 5.9* 5.7* 5.8*  ALBUMIN 2.6* 2.3* 2.3* 2.4* 2.4*   No results for input(s): LIPASE, AMYLASE in the last 168 hours. No results for input(s): AMMONIA in the last 168 hours. Coagulation profile No results for input(s): INR, PROTIME in the last 168 hours.  CBC: Recent Labs  Lab 12/10/20 0351 12/11/20 0207 12/12/20 0254 12/13/20 0148 12/14/20 0336  WBC 9.3 8.9 8.2 9.0 11.1*  NEUTROABS 6.0 5.4 4.6 5.7 6.0  HGB 13.5 13.3 13.1 13.1 13.8  HCT 44.3 43.2 43.7 44.0 44.9  MCV 78.7* 78.0* 78.7* 79.4* 78.5*  PLT 268 294 316 312 319   Cardiac Enzymes: No results for input(s): CKTOTAL, CKMB, CKMBINDEX, TROPONINI in the last 168 hours. BNP: Invalid  input(s): POCBNP CBG: No results for input(s): GLUCAP in the last 168 hours. D-Dimer No results for input(s): DDIMER in the last 72 hours. Hgb A1c No results for input(s): HGBA1C in the last 72 hours. Lipid Profile No results for input(s): CHOL, HDL, LDLCALC, TRIG, CHOLHDL, LDLDIRECT in the last 72 hours. Thyroid function studies No results for input(s): TSH, T4TOTAL, T3FREE, THYROIDAB in the last 72 hours.  Invalid input(s): FREET3 Anemia work up No results for input(s): VITAMINB12, FOLATE, FERRITIN, TIBC, IRON, RETICCTPCT in the last 72 hours. Microbiology No results found for this or any previous visit (from the past 240 hour(s)).   Discharge Instructions:   Discharge Instructions     Call MD for:  persistant nausea and vomiting   Complete by: As directed  Call MD for:  severe uncontrolled pain   Complete by: As directed    Call MD for:  temperature >100.4   Complete by: As directed    Diet general   Complete by: As directed    Discharge instructions   Complete by: As directed    Follow-up with your primary care physician in 1 week.  Do not overexert.  Increase oxygen to 6 L during rest and 10 L during exertion for next few days to weeks.  Check with your primary care doctor if you can go back on your regular oxygen in the next visit.   Increase activity slowly   Complete by: As directed       Allergies as of 12/14/2020       Reactions   Atorvastatin Itching, Swelling   Facial, tongue swelling   Shellfish Allergy Anaphylaxis   Colchicine Hives        Medication List     TAKE these medications    albuterol (2.5 MG/3ML) 0.083% nebulizer solution Commonly known as: PROVENTIL TAKE 3 MLS (2.5 MG TOTAL) BY NEBULIZATION EVERY 6 (SIX) HOURS AS NEEDED FOR SHORTNESS OF BREATH. What changed:  how much to take how to take this when to take this reasons to take this   ProAir HFA 108 (90 Base) MCG/ACT inhaler Generic drug: albuterol INHALE 2 PUFFS INTO THE LUNGS  EVERY 6 (SIX) HOURS AS NEEDED FOR WHEEZING OR SHORTNESS OF BREATH. What changed: Another medication with the same name was changed. Make sure you understand how and when to take each.   allopurinol 100 MG tablet Commonly known as: ZYLOPRIM TAKE 1 TABLET (100 MG TOTAL) BY MOUTH DAILY. What changed: how much to take   amLODipine 10 MG tablet Commonly known as: NORVASC TAKE 1 TABLET (10 MG TOTAL) BY MOUTH DAILY. What changed: how much to take   ammonium lactate 12 % lotion Commonly known as: LAC-HYDRIN Apply 1 application topically daily as needed (psoriasis).   cyclobenzaprine 5 MG tablet Commonly known as: FLEXERIL Take 1 tablet (5 mg total) by mouth 2 (two) times daily as needed for muscle spasms.   diphenhydrAMINE 25 MG tablet Commonly known as: BENADRYL Take 50 mg by mouth daily as needed (allergies).   ezetimibe 10 MG tablet Commonly known as: ZETIA TAKE 1 TABLET (10 MG TOTAL) BY MOUTH DAILY. What changed: how much to take   gabapentin 300 MG capsule Commonly known as: NEURONTIN TAKE 1 CAPSULE (300 MG TOTAL) BY MOUTH AT BEDTIME. What changed:  how much to take when to take this reasons to take this   guaiFENesin-dextromethorphan 100-10 MG/5ML syrup Commonly known as: ROBITUSSIN DM Take 10 mLs by mouth every 4 (four) hours as needed for cough.   hydrocortisone 1 % ointment Apply 1 application topically 2 (two) times daily.   ibuprofen 200 MG tablet Commonly known as: ADVIL Take 800 mg by mouth daily as needed (gout swelling).   losartan 50 MG tablet Commonly known as: COZAAR TAKE 1 TABLET (50 MG TOTAL) BY MOUTH DAILY. What changed: how much to take   metoprolol succinate 50 MG 24 hr tablet Commonly known as: TOPROL-XL TAKE 1 TABLET (50 MG TOTAL) BY MOUTH DAILY. What changed: how much to take   molnupiravir EUA 200 mg Caps Take 4 capsules by mouth 2 (two) times daily.   Otezla 30 MG Tabs Generic drug: Apremilast Take 30 mg by mouth 2 (two) times  daily.   predniSONE 20 MG tablet Commonly known as: DELTASONE  Take 1 tablet (20 mg total) by mouth daily.   Spiriva HandiHaler 18 MCG inhalation capsule Generic drug: tiotropium PLACE 1 CAPSULE (18 MCG TOTAL) INTO INHALER AND INHALE DAILY.   Symbicort 160-4.5 MCG/ACT inhaler Generic drug: budesonide-formoterol INAHLE 2 PUFFS INTO THE LUNGS TWICE DAILY. What changed:  how much to take how to take this when to take this   triamcinolone ointment 0.1 % Commonly known as: KENALOG APPLY 1 APPLICATION TOPICALLY 2 TIMES DAILY.   vitamin B-12 500 MCG tablet Commonly known as: CYANOCOBALAMIN Take 500 mcg by mouth daily.   vitamin C 1000 MG tablet Take 1,000 mg by mouth daily.               Durable Medical Equipment  (From admission, onward)           Start     Ordered   12/14/20 1251  For home use only DME oxygen  Once       Question Answer Comment  Length of Need Lifetime   Mode or (Route) Nasal cannula   Liters per Minute 6   Frequency Continuous (stationary and portable oxygen unit needed)   Oxygen conserving device Yes   Oxygen delivery system Gas      12/14/20 1251            Follow-up Information     Charlott Rakes, MD. Go on 01/10/2021.   Specialty: Family Medicine Why: '@9'$ :30am Contact information: [place] [id number] [phone]                  Time coordinating discharge: 39 minutes  Signed:  Oliviya Gilkison  [facility] 12/14/2020, 4:29 PM

## 2020-12-15 ENCOUNTER — Telehealth: Payer: Self-pay

## 2020-12-15 DIAGNOSIS — U071 COVID-19: Secondary | ICD-10-CM | POA: Diagnosis not present

## 2020-12-15 DIAGNOSIS — J449 Chronic obstructive pulmonary disease, unspecified: Secondary | ICD-10-CM | POA: Diagnosis not present

## 2020-12-15 NOTE — Telephone Encounter (Signed)
Transition Care Management Follow-up Telephone Call Date of discharge and from where: 12/14/2020, [facility]  How have you been since you were released from the hospital? He said he's doing pretty good.  Any questions or concerns? No  Items Reviewed: Did the pt receive and understand the discharge instructions provided? Yes  Medications obtained and verified? Yes  - he said he has all medications and did not have any questions about his med regime.  Other? No  Any new allergies since your discharge? No  Do you have support at home? Yes   Home Care and Equipment/Supplies: Were home health services ordered? no If so, what is the name of the agency? N/a  Has the agency set up a time to come to the patient's home? not applicable Were any new equipment or medical supplies ordered?  No What is the name of the medical supply agency? N/a Were you able to get the supplies/equipment? not applicable Do you have any questions related to the use of the equipment or supplies? No  Had been using O2 at home prior to this hospitalization.  He said he has been using it @ 6L when at rest and 10L on exertion.   Has nebulizer.   Functional Questionnaire: (I = Independent and D = Dependent) ADLs: independent   Follow up appointments reviewed:  PCP Hospital f/u appt confirmed? Yes  Scheduled to see Geryl Rankins, NP  on 01/10/2021 @ 0930. [facility] f/u appt confirmed?  None scheduled    Are transportation arrangements needed?  He may need transportation.  He said he was using the county transportation prior to [place]. Informed him that [employer] will provide rides to medical appointments. The phone number for [employer] was text to him as he requested.  If their condition worsens, is the pt aware to call PCP or go to the Emergency Dept.? Yes Was the patient provided with contact information for the PCP's office or ED? Yes Was to pt encouraged to call back with questions or concerns?  Yes

## 2020-12-20 ENCOUNTER — Other Ambulatory Visit: Payer: Self-pay

## 2020-12-21 ENCOUNTER — Other Ambulatory Visit: Payer: Self-pay

## 2021-01-10 ENCOUNTER — Inpatient Hospital Stay: Payer: Medicaid Other | Admitting: Nurse Practitioner

## 2021-01-17 ENCOUNTER — Other Ambulatory Visit: Payer: Self-pay | Admitting: Family Medicine

## 2021-01-17 ENCOUNTER — Other Ambulatory Visit: Payer: Self-pay

## 2021-01-17 DIAGNOSIS — M1A9XX0 Chronic gout, unspecified, without tophus (tophi): Secondary | ICD-10-CM

## 2021-01-17 DIAGNOSIS — I10 Essential (primary) hypertension: Secondary | ICD-10-CM

## 2021-01-17 MED ORDER — ALBUTEROL SULFATE HFA 108 (90 BASE) MCG/ACT IN AERS
INHALATION_SPRAY | RESPIRATORY_TRACT | 9 refills | Status: DC
  Filled 2021-01-17: qty 18, 25d supply, fill #0
  Filled 2021-02-18: qty 18, 25d supply, fill #1
  Filled 2021-03-23: qty 18, 25d supply, fill #2
  Filled 2021-04-20: qty 18, 25d supply, fill #0
  Filled 2021-05-03 – 2021-05-23 (×2): qty 18, 25d supply, fill #1
  Filled 2021-06-21: qty 18, 25d supply, fill #2
  Filled 2021-07-20: qty 18, 25d supply, fill #3
  Filled 2021-08-22: qty 18, 25d supply, fill #4
  Filled 2021-09-21: qty 18, 25d supply, fill #5
  Filled 2021-10-17: qty 18, 25d supply, fill #6

## 2021-01-17 NOTE — Telephone Encounter (Signed)
Requested medications are due for refill today yes  Requested medications are on the active medication list yes  Last refill 10/05/20 for all except Zyloprim which was 09/07/20  Last visit 06/08/20, was asked to return in 6 months, no upcoming appt and NO SHOW 01/10/21  Future visit scheduled No  Notes to clinic uric acid was last performed 2017, was to return in Sept but No show 10/3, please assess.

## 2021-01-18 ENCOUNTER — Other Ambulatory Visit: Payer: Self-pay

## 2021-01-19 ENCOUNTER — Telehealth: Payer: Self-pay | Admitting: *Deleted

## 2021-01-19 ENCOUNTER — Other Ambulatory Visit: Payer: Self-pay

## 2021-01-19 NOTE — Telephone Encounter (Signed)
Copied from [place] [id number]. Topic: General - Other >> Jan 19, 2021  9:57 AM Yvette Rack wrote: Reason for CRM: Pt stated he has not had his medications in 2 days and the pharmacy told him that they are waiting on the approval from the doctor. Pt requests call back at [phone]

## 2021-01-20 ENCOUNTER — Other Ambulatory Visit: Payer: Self-pay

## 2021-01-20 NOTE — Telephone Encounter (Signed)
Pt has appointment with Lazaro Arms tomorrow to discuss medication refills.

## 2021-01-21 ENCOUNTER — Encounter: Payer: Self-pay | Admitting: Nurse Practitioner

## 2021-01-21 ENCOUNTER — Ambulatory Visit (INDEPENDENT_AMBULATORY_CARE_PROVIDER_SITE_OTHER): Payer: Medicaid Other | Admitting: Nurse Practitioner

## 2021-01-21 ENCOUNTER — Other Ambulatory Visit: Payer: Self-pay

## 2021-01-21 DIAGNOSIS — M1A9XX0 Chronic gout, unspecified, without tophus (tophi): Secondary | ICD-10-CM

## 2021-01-21 DIAGNOSIS — I10 Essential (primary) hypertension: Secondary | ICD-10-CM | POA: Diagnosis not present

## 2021-01-21 MED ORDER — ALLOPURINOL 100 MG PO TABS
100.0000 mg | ORAL_TABLET | Freq: Every day | ORAL | 0 refills | Status: DC
  Filled 2021-01-21: qty 30, 30d supply, fill #0

## 2021-01-21 MED ORDER — METOPROLOL SUCCINATE ER 50 MG PO TB24
50.0000 mg | ORAL_TABLET | Freq: Every day | ORAL | 0 refills | Status: DC
  Filled 2021-01-21: qty 30, 30d supply, fill #0

## 2021-01-21 MED ORDER — LOSARTAN POTASSIUM 50 MG PO TABS
50.0000 mg | ORAL_TABLET | Freq: Every day | ORAL | 0 refills | Status: DC
  Filled 2021-01-21: qty 30, 30d supply, fill #0

## 2021-01-21 MED ORDER — AMLODIPINE BESYLATE 10 MG PO TABS
10.0000 mg | ORAL_TABLET | Freq: Every day | ORAL | 0 refills | Status: DC
  Filled 2021-01-21: qty 30, 30d supply, fill #0

## 2021-01-21 MED ORDER — EZETIMIBE 10 MG PO TABS
10.0000 mg | ORAL_TABLET | Freq: Every day | ORAL | 0 refills | Status: DC
  Filled 2021-01-21: qty 30, 30d supply, fill #0

## 2021-01-21 NOTE — Progress Notes (Signed)
Virtual Visit via Telephone Note  I connected with [name] [name] on 01/21/21 at  9:40 AM EDT by telephone and verified that I am speaking with the correct person using two identifiers.  Location: Patient: home Provider: office   I discussed the limitations, risks, security and privacy concerns of performing an evaluation and management service by telephone and the availability of in person appointments. I also discussed with the patient that there may be a patient responsible charge related to this service. The patient expressed understanding and agreed to proceed.   History of Present Illness:  Patient presents today for medication refill through televisit.  Patient states that overall he has been doing well.  No new issues or concerns today.  We have discussed medications he needs refilled and sent to the pharmacy.  Patient does not check blood pressures frequently at home.  He states that he does follow a low-sodium diet.  He states when he does check his blood pressure that it has been within normal range. Denies f/c/s, n/v/d, hemoptysis, PND, chest pain or edema.     Observations/Objective:  Vitals with BMI 12/14/2020 12/14/2020 12/14/2020  Height - - -  Weight - - -  BMI - - -  Systolic [phone]  Diastolic 93 94 75  Pulse - 86 -      Assessment and Plan:  Medication Refills:  Essential hypertension Controlled Continue current regimen Counseled on blood pressure goal of less than 130/80, low-sodium, DASH diet, medication compliance, 150 minutes of moderate intensity exercise per week. Discussed medication compliance, adverse effects. - amLODipine (NORVASC) 10 MG tablet; Take 1 tablet (10 mg total) by mouth daily.  - losartan (COZAAR) 50 MG tablet; Take 1 tablet (50 mg total) by mouth daily.   - metoprolol succinate (TOPROL-XL) 50 MG 24 hr tablet; Take 1 tablet (50 mg total) by mouth daily.   History of Gout:  Will refill allopurinol    Follow up:  Follow up with PCP  in 1-3 months for routine follow up    I discussed the assessment and treatment plan with the patient. The patient was provided an opportunity to ask questions and all were answered. The patient agreed with the plan and demonstrated an understanding of the instructions.   The patient was advised to call back or seek an in-person evaluation if the symptoms worsen or if the condition fails to improve as anticipated.  I provided 23 minutes of non-face-to-face time during this encounter.   Fenton Foy, NP

## 2021-01-21 NOTE — Patient Instructions (Addendum)
Medication Refills:  Essential hypertension Controlled Continue current regimen Counseled on blood pressure goal of less than 130/80, low-sodium, DASH diet, medication compliance, 150 minutes of moderate intensity exercise per week. Discussed medication compliance, adverse effects. - amLODipine (NORVASC) 10 MG tablet; Take 1 tablet (10 mg total) by mouth daily.  - losartan (COZAAR) 50 MG tablet; Take 1 tablet (50 mg total) by mouth daily.   - metoprolol succinate (TOPROL-XL) 50 MG 24 hr tablet; Take 1 tablet (50 mg total) by mouth daily.   History of Gout:  Will refill allopurinol    Follow up:  Follow up with PCP in 1-3 months for routine follow up

## 2021-02-18 ENCOUNTER — Other Ambulatory Visit: Payer: Self-pay | Admitting: Nurse Practitioner

## 2021-02-18 ENCOUNTER — Other Ambulatory Visit: Payer: Self-pay

## 2021-02-18 DIAGNOSIS — M1A9XX0 Chronic gout, unspecified, without tophus (tophi): Secondary | ICD-10-CM

## 2021-02-18 DIAGNOSIS — I10 Essential (primary) hypertension: Secondary | ICD-10-CM

## 2021-02-21 ENCOUNTER — Other Ambulatory Visit: Payer: Self-pay

## 2021-02-21 ENCOUNTER — Ambulatory Visit: Payer: Self-pay

## 2021-02-21 DIAGNOSIS — I10 Essential (primary) hypertension: Secondary | ICD-10-CM

## 2021-02-21 DIAGNOSIS — M1A9XX0 Chronic gout, unspecified, without tophus (tophi): Secondary | ICD-10-CM

## 2021-02-21 MED ORDER — METOPROLOL SUCCINATE ER 50 MG PO TB24
50.0000 mg | ORAL_TABLET | Freq: Every day | ORAL | 0 refills | Status: DC
  Filled 2021-02-21: qty 90, 90d supply, fill #0

## 2021-02-21 MED ORDER — ALLOPURINOL 100 MG PO TABS
100.0000 mg | ORAL_TABLET | Freq: Every day | ORAL | 0 refills | Status: DC
  Filled 2021-02-21: qty 90, 90d supply, fill #0

## 2021-02-21 MED ORDER — LOSARTAN POTASSIUM 50 MG PO TABS
50.0000 mg | ORAL_TABLET | Freq: Every day | ORAL | 0 refills | Status: DC
  Filled 2021-02-21: qty 90, 90d supply, fill #0

## 2021-02-21 MED ORDER — AMLODIPINE BESYLATE 10 MG PO TABS
10.0000 mg | ORAL_TABLET | Freq: Every day | ORAL | 0 refills | Status: DC
  Filled 2021-02-21: qty 90, 90d supply, fill #0

## 2021-02-21 MED ORDER — EZETIMIBE 10 MG PO TABS
10.0000 mg | ORAL_TABLET | Freq: Every day | ORAL | 0 refills | Status: DC
  Filled 2021-02-21: qty 90, 90d supply, fill #0

## 2021-02-21 NOTE — Telephone Encounter (Signed)
Courtesy refill given.   Requested Prescriptions  Pending Prescriptions Disp Refills  . allopurinol (ZYLOPRIM) 100 MG tablet 90 tablet 0    Sig: Take 1 tablet (100 mg total) by mouth daily.     Endocrinology:  Gout Agents Failed - 02/21/2021 11:43 AM      Failed - Uric Acid in normal range and within 360 days    Uric Acid, Serum  Date Value Ref Range Status  09/17/2015 7.2 4.0 - 8.0 mg/dL Final    Comment:    Therapeutic target for gout patients: <6.0 mg/dL         Passed - Cr in normal range and within 360 days    Creat  Date Value Ref Range Status  09/17/2015 1.50 (H) 0.70 - 1.33 mg/dL Final   Creatinine, Ser  Date Value Ref Range Status  12/12/2020 1.07 0.61 - 1.24 mg/dL Final   Creatinine, Urine  Date Value Ref Range Status  10/26/2014 64.52 mg/dL Final         Passed - Valid encounter within last 12 months    Recent Outpatient Visits          8 months ago Influenza vaccine refused   [place], [place], MD   1 year ago Screening for colon cancer   [place], [place]ong, MD   2 years ago [place], Enobong, MD   2 years ago Constipation, unspecified constipation type   [place], [place], [place]   2 years ago History of tobacco abuse   [place], MD      Future Appointments            In 1 month Charlott Rakes, MD [place]           . amLODipine (NORVASC) 10 MG tablet 90 tablet 0    Sig: Take 1 tablet (10 mg total) by mouth daily.     Cardiovascular:  Calcium Channel Blockers Failed - 02/21/2021 11:43 AM      Failed - Last BP in normal range    BP Readings from Last 1 Encounters:  12/14/20 (!) 134/93         Failed - Valid encounter within last 6 months    Recent Outpatient Visits          8 months ago  Influenza vaccine refused   [place], Charlane Ferretti, MD   1 year ago Screening for colon cancer   [place], MD   2 years ago [place], Enobong, MD   2 years ago Constipation, unspecified constipation type   [place], [place], [place]   2 years ago History of tobacco abuse   [place], Enobong, MD      Future Appointments            In 1 month Charlott Rakes, MD [place]           . ezetimibe (ZETIA) 10 MG tablet 90 tablet 0    Sig: Take 1 tablet (10 mg total) by mouth daily.     Cardiovascular:  Antilipid - Sterol Transport Inhibitors Failed - 02/21/2021 11:43 AM  Failed - Total Cholesterol in normal range and within 360 days    Cholesterol, Total  Date Value Ref Range Status  06/08/2020 222 (H) 100 - 199 mg/dL Final         Failed - LDL in normal range and within 360 days    LDL Chol Calc (NIH)  Date Value Ref Range Status  06/08/2020 161 (H) 0 - 99 mg/dL Final         Passed - HDL in normal range and within 360 days    HDL  Date Value Ref Range Status  06/08/2020 48 >39 mg/dL Final         Passed - Triglycerides in normal range and within 360 days    Triglycerides  Date Value Ref Range Status  06/08/2020 76 0 - 149 mg/dL Final         Passed - Valid encounter within last 12 months    Recent Outpatient Visits          8 months ago Influenza vaccine refused   [place], Charlane Ferretti, MD   1 year ago Screening for colon cancer   [place], Enobong, MD   2 years ago [place], Enobong, MD   2 years ago Constipation, unspecified constipation type   [place],  [place], [place]   2 years ago History of tobacco abuse   [place], Enobong, MD      Future Appointments            In 1 month Charlott Rakes, MD [place]           . metoprolol succinate (TOPROL-XL) 50 MG 24 hr tablet 90 tablet 0    Sig: Take 1 tablet (50 mg total) by mouth daily.     Cardiovascular:  Beta Blockers Failed - 02/21/2021 11:43 AM      Failed - Last BP in normal range    BP Readings from Last 1 Encounters:  12/14/20 (!) 134/93         Failed - Valid encounter within last 6 months    Recent Outpatient Visits          8 months ago Influenza vaccine refused   [place], Charlane Ferretti, MD   1 year ago Screening for colon cancer   [place], MD   2 years ago [place], Enobong, MD   2 years ago Constipation, unspecified constipation type   [place], [place], [place]   2 years ago History of tobacco abuse   [place], En[place]ong, MD      Future Appointments            In 1 month Charlott Rakes, MD [place] - Last Heart Rate in normal range    Pulse Readings from Last 1 Encounters:  12/14/20 86         . losartan (COZAAR) 50 MG tablet 90 tablet 0    Sig: Take 1 tablet (50 mg total) by mouth daily.     Cardiovascular:  Angiotensin Receptor Blockers Failed - 02/21/2021 11:43  AM      Failed - Last BP in normal range    BP Readings from Last 1 Encounters:  12/14/20 (!) 134/93         Failed - Valid encounter within last 6 months    Recent Outpatient Visits          8 months ago Influenza vaccine refused   [place], Charlane Ferretti, MD   1 year ago Screening for colon cancer   [place], MD   2 years ago [place], Enobong, MD   2 years ago Constipation, unspecified constipation type   [place], [place], [place]   2 years ago History of tobacco abuse   [place], Charlane Ferretti, MD      Future Appointments            In 1 month Charlott Rakes, MD [place] - Cr in normal range and within 180 days    Creat  Date Value Ref Range Status  09/17/2015 1.50 (H) 0.70 - 1.33 mg/dL Final   Creatinine, Ser  Date Value Ref Range Status  12/12/2020 1.07 0.61 - 1.24 mg/dL Final   Creatinine, Urine  Date Value Ref Range Status  10/26/2014 64.52 mg/dL Final         Passed - K in normal range and within 180 days    Potassium  Date Value Ref Range Status  12/12/2020 3.8 3.5 - 5.1 mmol/L Final         Passed - Patient is not pregnant

## 2021-02-21 NOTE — Telephone Encounter (Signed)
1104- pt called back and asked if he would be able to come into office this week for appt to get meds refilled and pt states that his daughter brings him to appts d/t being on oxygen. Daughter is unable to bring him this week so advised that meds would be refilled but pt needs to make sure he attends appt on 04/19/21. Pt verbalized understanding.   Pt called in stating he needs medications refilled and every time he calls in no one ever calls him back. Pt gave medications that need refill: amlodipine, allopurinol, losartan, metoprolol, and zetia. Pt had TV on 01/21/21 with Nils Pyle, NP who addressed these medications and gave 30 day refill. Pt is to follow up with PCP in 1-3 months. Pt is scheduled for appt on 04/19/21 at 1410 with Dr. Margarita Rana. Advised pt I needed to look into whether meds can be refilled or if OV appt is needed and would call pt back.   Reason for Disposition  [1] Caller requesting a prescription renewal (no refills left), no triage required, AND [2] triager able to renew prescription per department policy  Answer Assessment - Initial Assessment Questions 1. DRUG NAME: "What medicine do you need to have refilled?"     Metoprolol, losartan,  amlodipine, allopurinol, zetia 2. REFILLS REMAINING: "How many refills are remaining?" (Note: The label on the medicine or pill bottle will show how many refills are remaining. If there are no refills remaining, then a renewal may be needed.)     0 refills 3. EXPIRATION DATE: "What is the expiration date?" (Note: The label states when the prescription will expire, and thus can no longer be refilled.)     Hard to see little writing 4. PRESCRIBING HCP: "Who prescribed it?" Reason: If prescribed by specialist, call should be referred to that group.     Zelda Fleming 5. SYMPTOMS: "Do you have any symptoms?"     No 6. PREGNANCY: "Is there any chance that you are pregnant?" "When was your last menstrual period?"     NA  Protocols used: Medication  Refill and Renewal Call-A-AH

## 2021-02-22 ENCOUNTER — Other Ambulatory Visit: Payer: Self-pay

## 2021-02-23 ENCOUNTER — Other Ambulatory Visit: Payer: Self-pay

## 2021-03-21 ENCOUNTER — Other Ambulatory Visit: Payer: Self-pay

## 2021-03-22 ENCOUNTER — Other Ambulatory Visit: Payer: Self-pay

## 2021-03-23 ENCOUNTER — Other Ambulatory Visit: Payer: Self-pay

## 2021-04-11 ENCOUNTER — Other Ambulatory Visit: Payer: Self-pay

## 2021-04-12 ENCOUNTER — Telehealth: Payer: Self-pay | Admitting: Family Medicine

## 2021-04-12 NOTE — Telephone Encounter (Signed)
.. °  Medicaid Managed Care  14 Unsuccessful Outreach Note  04/12/2021 Name: [name] [name] MRN: [id number] DOB: 02-08-1961  Referred by: Charlott Rakes, MD Reason for referral : High Risk Managed Medicaid (I called the patient today to get him scheduled with the [facility] Team. I left my name and number on his VM.)   An unsuccessful telephone outreach was attempted today. The patient was referred to the case management team for assistance with care management and care coordination.   Follow Up Plan: The care management team will reach out to the patient again over the next 14 days.   [place]

## 2021-04-19 ENCOUNTER — Ambulatory Visit: Payer: Medicaid Other | Attending: Family Medicine | Admitting: Family Medicine

## 2021-04-19 ENCOUNTER — Other Ambulatory Visit: Payer: Self-pay

## 2021-04-19 DIAGNOSIS — M1A9XX0 Chronic gout, unspecified, without tophus (tophi): Secondary | ICD-10-CM | POA: Diagnosis not present

## 2021-04-19 DIAGNOSIS — J9611 Chronic respiratory failure with hypoxia: Secondary | ICD-10-CM

## 2021-04-19 DIAGNOSIS — J449 Chronic obstructive pulmonary disease, unspecified: Secondary | ICD-10-CM | POA: Diagnosis not present

## 2021-04-19 DIAGNOSIS — Z789 Other specified health status: Secondary | ICD-10-CM | POA: Diagnosis not present

## 2021-04-19 DIAGNOSIS — E7800 Pure hypercholesterolemia, unspecified: Secondary | ICD-10-CM | POA: Diagnosis not present

## 2021-04-19 DIAGNOSIS — E785 Hyperlipidemia, unspecified: Secondary | ICD-10-CM | POA: Insufficient documentation

## 2021-04-19 DIAGNOSIS — I10 Essential (primary) hypertension: Secondary | ICD-10-CM | POA: Diagnosis not present

## 2021-04-19 MED ORDER — LOSARTAN POTASSIUM 50 MG PO TABS
50.0000 mg | ORAL_TABLET | Freq: Every day | ORAL | 1 refills | Status: DC
  Filled 2021-04-19 – 2021-05-30 (×2): qty 90, 90d supply, fill #0
  Filled 2021-08-22: qty 90, 90d supply, fill #1

## 2021-04-19 MED ORDER — ALLOPURINOL 100 MG PO TABS
100.0000 mg | ORAL_TABLET | Freq: Every day | ORAL | 1 refills | Status: DC
Start: 2021-04-19 — End: 2021-12-05
  Filled 2021-04-19 – 2021-05-30 (×2): qty 90, 90d supply, fill #0
  Filled 2021-08-22: qty 90, 90d supply, fill #1

## 2021-04-19 MED ORDER — EZETIMIBE 10 MG PO TABS
10.0000 mg | ORAL_TABLET | Freq: Every day | ORAL | 1 refills | Status: DC
  Filled 2021-04-19 – 2021-05-30 (×2): qty 90, 90d supply, fill #0
  Filled 2021-08-22: qty 90, 90d supply, fill #1

## 2021-04-19 MED ORDER — AMLODIPINE BESYLATE 10 MG PO TABS
10.0000 mg | ORAL_TABLET | Freq: Every day | ORAL | 1 refills | Status: DC
  Filled 2021-04-19 – 2021-05-30 (×2): qty 90, 90d supply, fill #0
  Filled 2021-08-22: qty 90, 90d supply, fill #1

## 2021-04-19 MED ORDER — METOPROLOL SUCCINATE ER 50 MG PO TB24
50.0000 mg | ORAL_TABLET | Freq: Every day | ORAL | 1 refills | Status: DC
Start: 2021-04-19 — End: 2021-12-05
  Filled 2021-04-19 – 2021-05-30 (×2): qty 90, 90d supply, fill #0
  Filled 2021-08-22: qty 90, 90d supply, fill #1

## 2021-04-19 NOTE — Progress Notes (Signed)
Virtual Visit via Telephone Note  I connected with [name] [name], on 04/19/2021 at 2:35 PM by telephone due to the COVID-19 pandemic and verified that I am speaking with the correct person using two identifiers.   Consent: I discussed the limitations, risks, security and privacy concerns of performing an evaluation and management service by telephone and the availability of in person appointments. I also discussed with the patient that there may be a patient responsible charge related to this service. The patient expressed understanding and agreed to proceed.   Location of Patient: Home  Location of Provider: Clinic   Persons participating in Telemedicine visit: [name] [name] Dr. Margarita Rana     History of Present Illness: [name] [name] is a 61 y.o. year old male with a history of end-stage COPD (on 6 L of oxygen at rest and 8 L with ambulation), gout, ulnar neuropathy, hypertension, Psoriasis.   States he feel pretty good today and has no concerns.  He is has been stable and he is doing well on his antihypertensives. He is not on a statin even though he has an increased 10-year ASCVD risk score of 16.3% due to statin intolerance.  I have discussed the need to commence a PCSK9 inhibitor which he is not open to but promises to think about it. He has had no COPD exacerbations and is compliant with his inhalers which are prescribed by his pulmonologist.  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 (Patient taking differently: Take 2.5 mg by nebulization every 6 (six) hours as needed for wheezing or shortness of breath.) 360 mL 11   albuterol (VENTOLIN HFA) 108 (90 Base) MCG/ACT inhaler inhale 2 pufss every 6 hours as needed for wheezing or shortness of breath 18 g 9   allopurinol (ZYLOPRIM) 100 MG tablet Take 1 tablet (100 mg total) by mouth daily. 90 tablet 0   amLODipine (NORVASC) 10 MG tablet Take 1 tablet (10 mg total) by mouth daily. 90 tablet 0   ammonium lactate (LAC-HYDRIN) 12 % lotion Apply 1 application topically daily as needed (psoriasis).     Apremilast (OTEZLA) 30 MG TABS Take 30 mg by mouth 2 (two) times daily.     Ascorbic Acid (VITAMIN C) 1000 MG tablet Take 1,000 mg by mouth daily.     budesonide-formoterol (SYMBICORT) 160-4.5 MCG/ACT inhaler INHALE 2 PUFFS INTO THE LUNGS TWICE DAILY. 10.2 g 11   cyclobenzaprine (FLEXERIL) 5 MG tablet Take 1 tablet (5 mg total) by mouth 2 (two) times daily as needed for muscle spasms. (Patient not taking: No sig reported) 20 tablet 0   diphenhydrAMINE (BENADRYL) 25 MG tablet Take 50 mg by mouth daily as needed (allergies).      ezetimibe (ZETIA) 10 MG tablet Take 1 tablet (10 mg total) by mouth daily. 90 tablet 0   gabapentin (NEURONTIN) 300 MG capsule TAKE  1 CAPSULE (300 MG TOTAL) BY MOUTH AT BEDTIME. (Patient taking differently: Take 300 mg by mouth at bedtime as needed (pain).) 90 capsule 1   guaiFENesin-dextromethorphan (ROBITUSSIN DM) 100-10 MG/5ML syrup Take 10 mLs by mouth every 4 (four) hours as needed for cough. 118 mL 0   hydrocortisone 1 % ointment Apply 1 application topically 2 (two) times daily. (Patient not taking: Reported on 12/04/2020) 30 g 0   ibuprofen (ADVIL) 200 MG tablet Take 800 mg by mouth daily as needed (gout swelling).     losartan (COZAAR) 50 MG tablet Take 1 tablet (50 mg total) by mouth daily. 90  tablet 0   metoprolol succinate (TOPROL-XL) 50 MG 24 hr tablet Take 1 tablet (50 mg total) by mouth daily. 90 tablet 0   molnupiravir EUA 200 mg CAPS Take 4 capsules by mouth 2 (two) times daily.     predniSONE (DELTASONE) 20 MG tablet Take 1 tablet (20 mg total) by mouth daily. 4 tablet 0   tiotropium (SPIRIVA HANDIHALER) 18 MCG inhalation capsule PLACE 1 CAPSULE (18 MCG TOTAL) INTO INHALER AND INHALE DAILY. 30 capsule 11   triamcinolone ointment (KENALOG) 0.1 % APPLY 1 APPLICATION TOPICALLY 2 TIMES DAILY. (Patient not taking: Reported on 12/04/2020) 80 g 0   vitamin B-12 (CYANOCOBALAMIN) 500 MCG tablet Take 500 mcg by mouth daily.     [DISCONTINUED] ALBUTEROL IN Inhale into the lungs.     No current facility-administered medications on file prior to visit.    ROS: See HPI  Observations/Objective: Awake, alert, oriented x3 Not in acute distress Normal mood   CMP Latest Ref Rng & Units 12/12/2020 12/11/2020 12/10/2020  Glucose 70 - 99 mg/dL 109(H) 97 102(H)  BUN 8 - 23 mg/dL 18 17 19   Creatinine 0.61 - 1.24 mg/dL 1.07 1.09 1.09  Sodium 135 - 145 mmol/L 136 137 136  Potassium 3.5 - 5.1 mmol/L 3.8 4.3 4.1  Chloride 98 - 111 mmol/L 98 95(L) 97(L)  CO2 22 - 32 mmol/L 29 37(H) 36(H)  Calcium 8.9 - 10.3 mg/dL 8.2(L) 8.5(L) 8.3(L)  Total Protein 6.5 - 8.1 g/dL 5.8(L) 5.7(L) 5.9(L)  Total Bilirubin 0.3 - 1.2 mg/dL 0.9 0.6 0.8  Alkaline Phos 38 - 126 U/L 50 52 53  AST 15 - 41 U/L 38 44(H) 44(H)  ALT 0 - 44 U/L 109(H) 117(H) 114(H)    Lipid Panel     Component Value Date/Time   CHOL 222 (H) 06/08/2020 1047   TRIG 76 06/08/2020 1047   HDL 48 06/08/2020 1047   CHOLHDL 4.6 06/08/2020 1047   CHOLHDL 4.1 10/26/2014 0429   VLDL 10 10/26/2014 0429   LDLCALC 161 (H) 06/08/2020 1047   LABVLDL 13 06/08/2020 1047    Lab Results  Component Value Date   HGBA1C 5.2 10/26/2014    The 10-year ASCVD risk score (Arnett DK, et al., 2019) is: 16.3%   Values used to calculate the score:     Age:  31 years     Sex: Male     Is Non-Hispanic African American: Yes     Diabetic: No     Tobacco smoker: No     Systolic Blood Pressure: 182 mmHg     Is BP treated: Yes     HDL Cholesterol: 48 mg/dL     Total Cholesterol: 222 mg/dL  Assessment and Plan: 1. Essential hypertension Controlled Counseled on blood pressure goal of less than 130/80, low-sodium, DASH diet, medication compliance, 150 minutes of moderate intensity exercise per  week. Discussed medication compliance, adverse effects. - amLODipine (NORVASC) 10 MG tablet; Take 1 tablet (10 mg total) by mouth daily.  Dispense: 90 tablet; Refill: 1 - metoprolol succinate (TOPROL-XL) 50 MG 24 hr tablet; Take 1 tablet (50 mg total) by mouth daily.  Dispense: 90 tablet; Refill: 1 - losartan (COZAAR) 50 MG tablet; Take 1 tablet (50 mg total) by mouth daily.  Dispense: 90 tablet; Refill: 1  2. Chronic gout without tophus, unspecified cause, unspecified site Stable - allopurinol (ZYLOPRIM) 100 MG tablet; Take 1 tablet (100 mg total) by mouth daily.  Dispense: 90 tablet; Refill: 1  3. Pure hypercholesterolemia Uncontrolled with 10-year ASCVD risk score of 16.3% He does have statin intolerance He will benefit from a PCSK9 inhibitor or and I have discussed this with him but he declines at this time but promises to think about it Low-cholesterol diet - ezetimibe (ZETIA) 10 MG tablet; Take 1 tablet (10 mg total) by mouth daily.  Dispense: 90 tablet; Refill: 1  4. Chronic respiratory failure with hypoxia (HCC) Currently on chronic oxygen 6 L at rest and 8 L with ambulation  5. COPD without exacerbation ([place]) Stable without exacerbation Continue ICS/LABA, MDI Followed by pulmonary  6. Statin intolerance He is a candidate for PCSK9 inhibitor but he is undecided at this time Will revisit again at next visit   Follow Up Instructions: 3 months for chronic disease management   I discussed the assessment and treatment plan with the patient.  The patient was provided an opportunity to ask questions and all were answered. The patient agreed with the plan and demonstrated an understanding of the instructions.   The patient was advised to call back or seek an in-person evaluation if the symptoms worsen or if the condition fails to improve as anticipated.     I provided 15 minutes total of non-face-to-face time during this encounter.   Charlott Rakes, MD, FAAFP. [facility] and [place], [place]   04/19/2021, 2:35 PM

## 2021-04-20 ENCOUNTER — Other Ambulatory Visit: Payer: Self-pay

## 2021-04-20 ENCOUNTER — Encounter: Payer: Self-pay | Admitting: Family Medicine

## 2021-04-20 ENCOUNTER — Other Ambulatory Visit: Payer: Self-pay | Admitting: *Deleted

## 2021-04-20 DIAGNOSIS — Z789 Other specified health status: Secondary | ICD-10-CM | POA: Insufficient documentation

## 2021-04-20 NOTE — Patient Outreach (Signed)
Care Coordination  04/20/2021  [name] [name] [birth year] [id number]  Allen Egerton was referred to the [facility] Managed Care High Risk team for assistance with care coordination and care management services. Care coordination/care management services as part of the Medicaid benefit was offered to the patient today. The patient declined assistance offered today.   Plan: The Medicaid Managed Care High Risk team is available at any time in the future to assist with care coordination/care management services upon referral.   Lurena Joiner RN, BSN [place] RN Care Coordinator

## 2021-04-20 NOTE — Patient Instructions (Signed)
Dear [name] [name],   Thank you for taking time to speak with me today about care coordination and care management services available to you at no cost as part of your Medicaid benefit. These services are voluntary. Our team is available to provide assistance regarding your health care needs at any time. Please do not hesitate to reach out to me if we can be of service to you at any time in the future. [place], BSN [place] RN Care Coordinator

## 2021-04-21 ENCOUNTER — Other Ambulatory Visit: Payer: Self-pay

## 2021-05-03 ENCOUNTER — Other Ambulatory Visit: Payer: Self-pay

## 2021-05-23 ENCOUNTER — Other Ambulatory Visit: Payer: Self-pay

## 2021-05-30 ENCOUNTER — Other Ambulatory Visit: Payer: Self-pay

## 2021-06-02 ENCOUNTER — Ambulatory Visit: Payer: Self-pay

## 2021-06-02 NOTE — Telephone Encounter (Signed)
° °  Chief Complaint: gout flare up Symptoms: Bilat LE pain and swelling Frequency: couple of days Pertinent Negatives: NA Disposition: [] ED /[] Urgent Care (no appt availability in office) / [x] Appointment(In office/virtual)/ []  [place] [facility]/ [] Home Care/ [] Refused Recommended Disposition /[] [place] [facility]/ []  Follow-up with PCP Additional Notes: Pt states he was told when I asked about taking the allopurinol that he was told when he has a flare up to quit taking the medication. I advised him to take the medication d/t it helping with the gout inflammation and decreasing uric acid build up and to take ibuprofen for pain and if that doesn't improve by Monday to call and we can schedule an appt.    Summary: Gout Advice Swollen Legs   Pt is calling to asking advice about his gout and swelling in both the right & left leg and foot. Pt wants to know does he needs to visit a doctor and patient has COPD. And does not like going out much.      Reason for Disposition  [1] MILD pain (e.g., does not interfere with normal activities) AND [2] present > 7 days  Answer Assessment - Initial Assessment Questions 1. ONSET: "When did the pain start?"      Couple days ago 2. LOCATION: "Where is the pain located?"      Bilat LE and feet 3. PAIN: "How bad is the pain?"    (Scale 1-10; or mild, moderate, severe)  - MILD (1-3): doesn't interfere with normal activities.   - MODERATE (4-7): interferes with normal activities (e.g., work or school) or awakens from sleep, limping.   - SEVERE (8-10): excruciating pain, unable to do any normal activities, unable to walk.      6/7  5. CAUSE: "What do you think is causing the foot pain?"     Gout flare up 6. OTHER SYMPTOMS: "Do you have any other symptoms?" (e.g., leg pain, rash, fever, numbness)     Leg and feet pain  Protocols used: Foot Pain-A-AH

## 2021-06-21 ENCOUNTER — Other Ambulatory Visit: Payer: Self-pay

## 2021-06-22 ENCOUNTER — Other Ambulatory Visit: Payer: Self-pay

## 2021-07-03 IMAGING — CT CT CHEST WITH CONTRAST
2 of 6 series · 14 of 36 positions shown, 17 images · IV contrast (iopamidol)
Comparison: CT scan of December 13, 2013.

CLINICAL DATA: Blunt abdominal trauma.

EXAM:
CT CHEST, ABDOMEN, AND PELVIS WITH CONTRAST
TECHNIQUE: Multidetector CT imaging of the chest, abdomen and pelvis was
performed following the standard protocol during bolus
administration of intravenous contrast.
CONTRAST:  100mL ZXERSX-MCC IOPAMIDOL (ZXERSX-MCC) INJECTION 61%

[Series 5: cap with 3mm st cor · coronal · 0.73mm/px · 3 of 151 slices shown]
[im 31/151  lung]
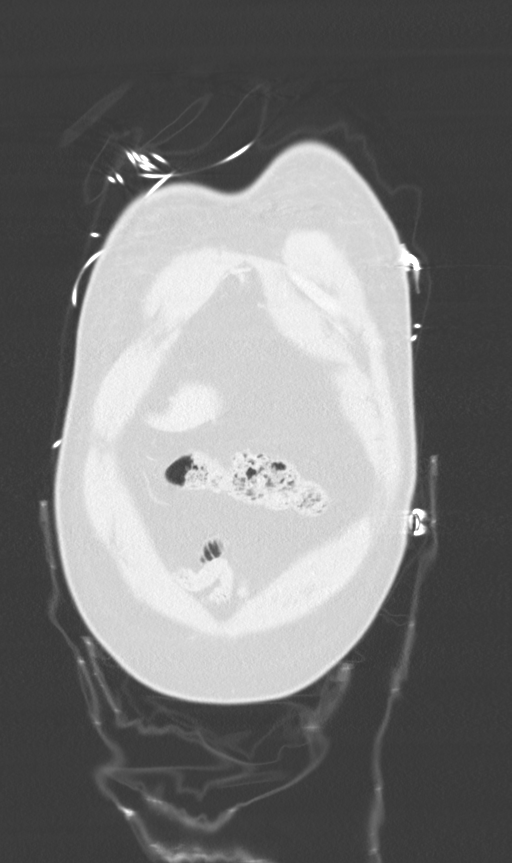
[im 61/151  lung]
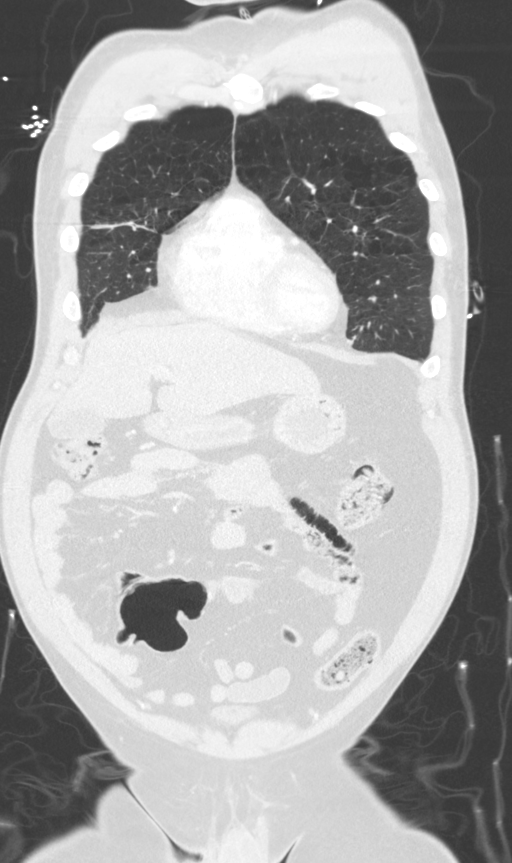
[im 91/151  lung]
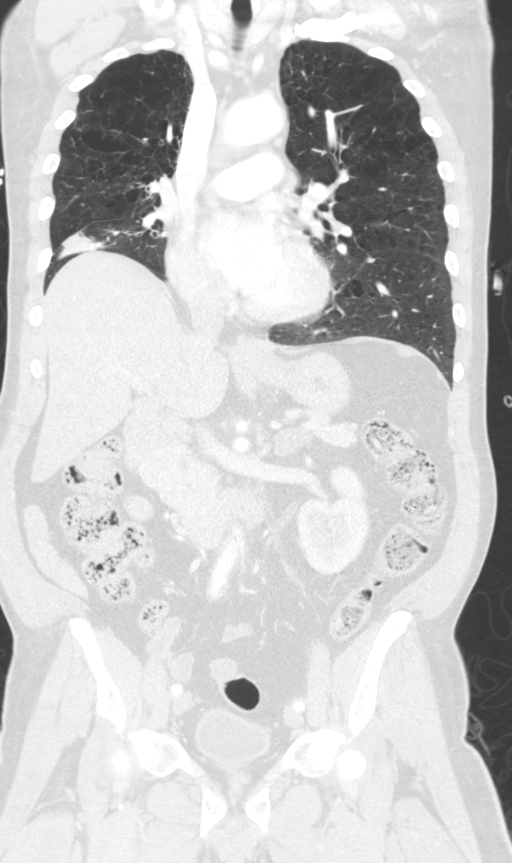

[Series 7: cap 1.0 · axial · 0.89mm/px · z∈[+699,+1325]mm · 11 of 874 slices shown, 14 images]
[im 46/874  mediastinal]
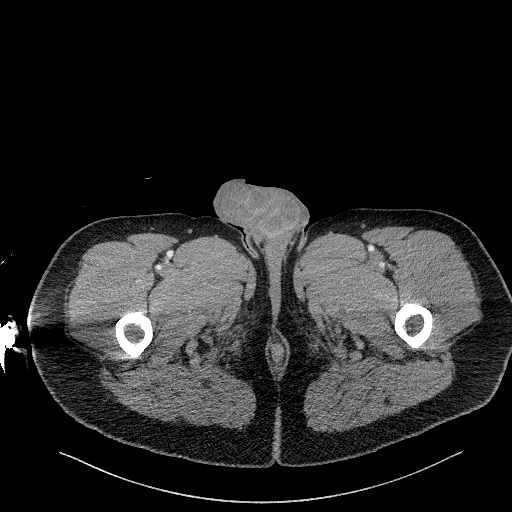
[im 46/874  lung]
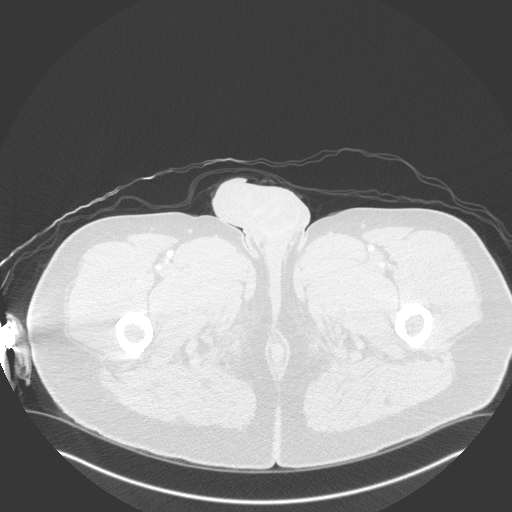
[im 138/874  lung]
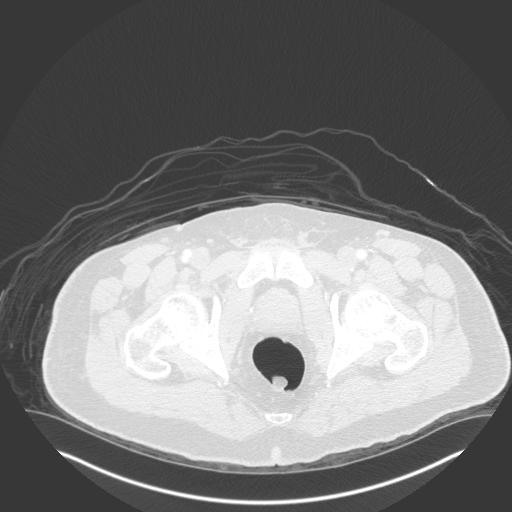
[im 230/874  lung]
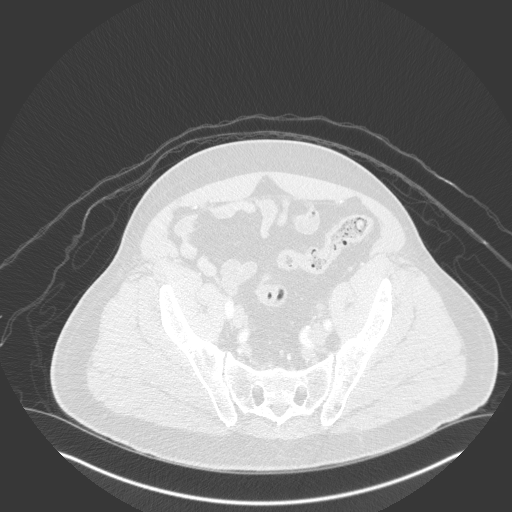
[im 276/874  lung]
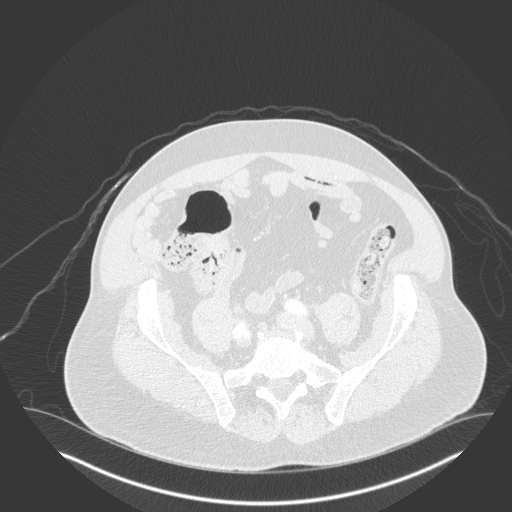
[im 368/874  mediastinal]
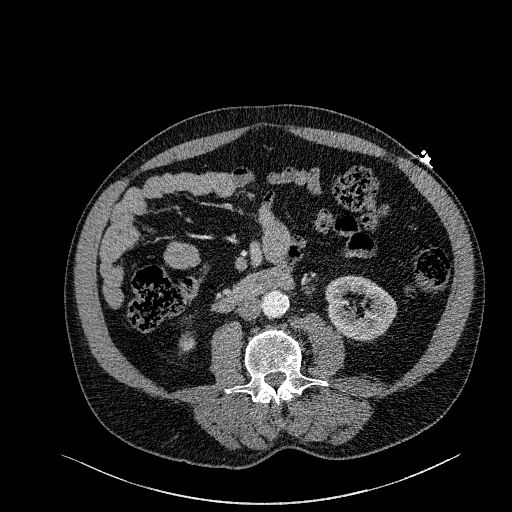
[im 368/874  lung]
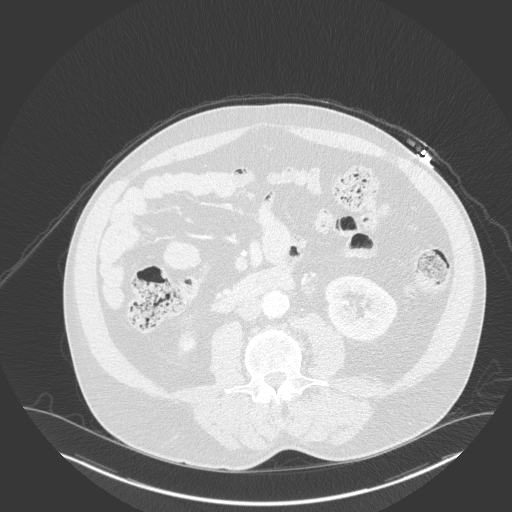
[im 460/874  lung]
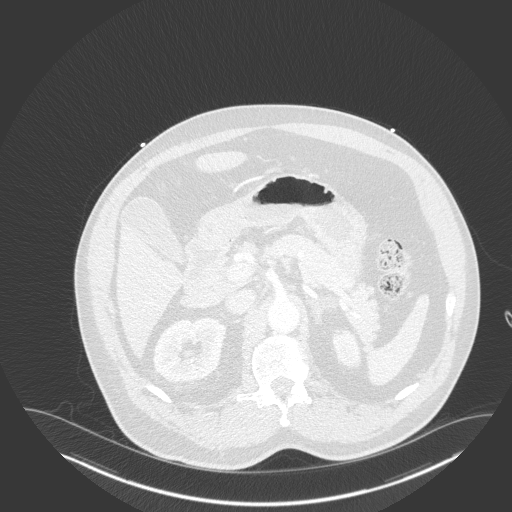
[im 506/874  lung]
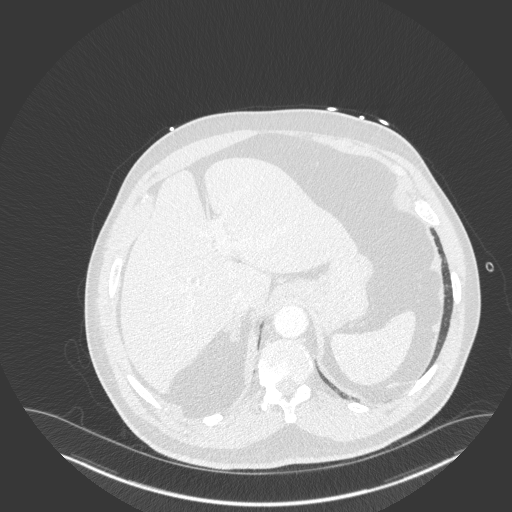
[im 598/874  lung]
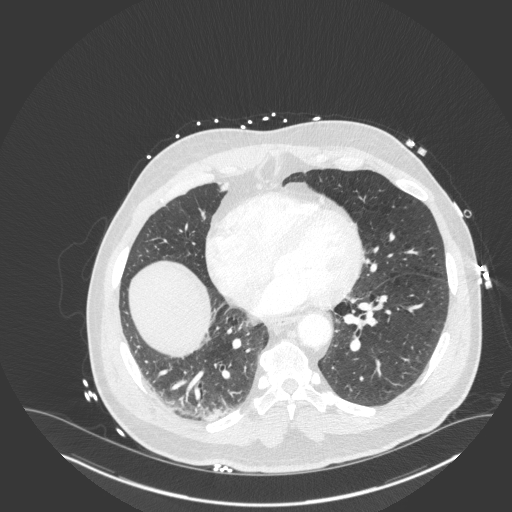
[im 644/874  mediastinal]
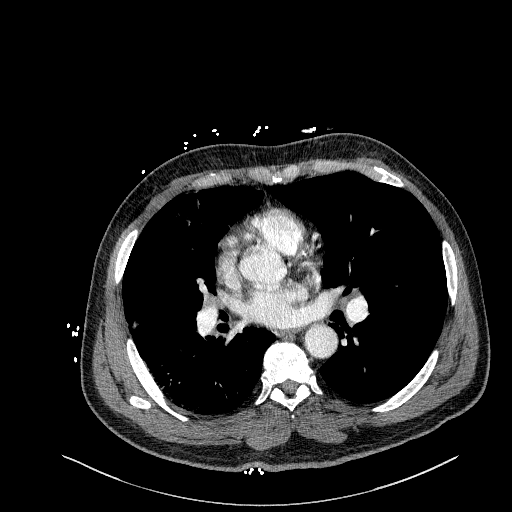
[im 644/874  lung]
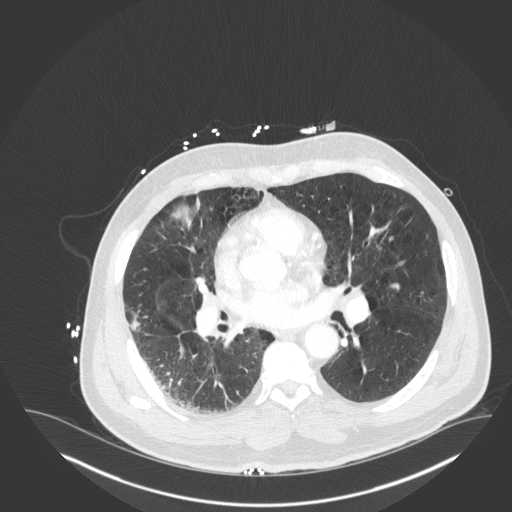
[im 736/874  lung]
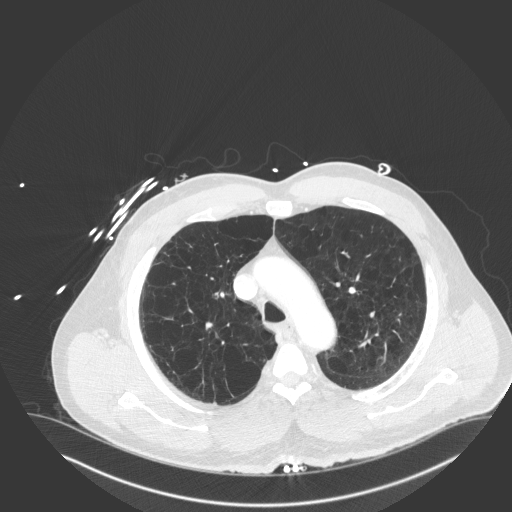
[im 828/874  lung]
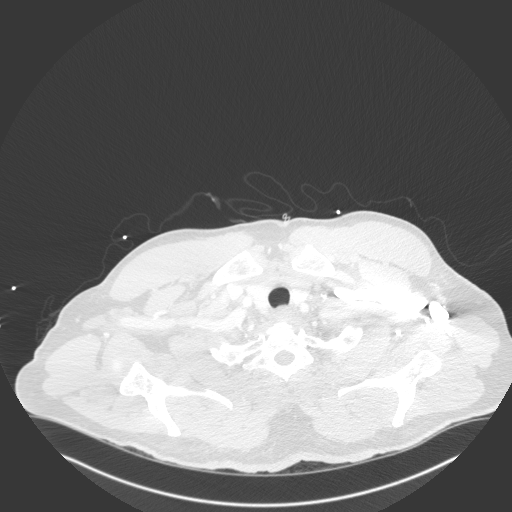

[14 of 36 positions shown; findings below may reference images not displayed]

FINDINGS: CT CHEST FINDINGS

Cardiovascular: No significant vascular findings. Normal heart size.
No pericardial effusion.

Mediastinum/Nodes: No enlarged mediastinal, hilar, or axillary lymph
nodes. Thyroid gland, trachea, and esophagus demonstrate no
significant findings.

Lungs/Pleura: No pneumothorax or pleural effusion is noted.
Emphysematous disease is noted in both upper lobes. Minimal right
posterior basilar subsegmental atelectasis is noted. Mild right
middle lobe subsegmental atelectasis is noted. Minimal left
posterior basilar subsegmental atelectasis is noted.

Musculoskeletal: No chest wall mass or suspicious bone lesions
identified.

CT ABDOMEN PELVIS FINDINGS

Hepatobiliary: No focal liver abnormality is seen. No gallstones,
gallbladder wall thickening, or biliary dilatation.

Pancreas: Unremarkable. No pancreatic ductal dilatation or
surrounding inflammatory changes.

Spleen: Normal in size without focal abnormality.

Adrenals/Urinary Tract: Adrenal glands are unremarkable. Kidneys are
normal, without renal calculi, focal lesion, or hydronephrosis.
Bladder is unremarkable.

Stomach/Bowel: Stomach is within normal limits. Appendix appears
normal. No evidence of bowel wall thickening, distention, or
inflammatory changes.

Vascular/Lymphatic: Aortic atherosclerosis. No enlarged abdominal or
pelvic lymph nodes.

Reproductive: Prostate is unremarkable.

Other: No abdominal wall hernia or abnormality. No abdominopelvic
ascites.

Musculoskeletal: No acute or significant osseous findings.
IMPRESSION: No evidence of traumatic injury seen in the chest, abdomen or
pelvis.

Bilateral subsegmental atelectasis is noted.

Aortic Atherosclerosis (IDETE-JOR.R) and Emphysema (IDETE-3HU.C).

## 2021-07-05 DIAGNOSIS — U071 COVID-19: Secondary | ICD-10-CM | POA: Diagnosis not present

## 2021-07-05 DIAGNOSIS — J449 Chronic obstructive pulmonary disease, unspecified: Secondary | ICD-10-CM | POA: Diagnosis not present

## 2021-07-20 ENCOUNTER — Other Ambulatory Visit: Payer: Self-pay

## 2021-07-22 ENCOUNTER — Other Ambulatory Visit: Payer: Self-pay

## 2021-08-22 ENCOUNTER — Other Ambulatory Visit: Payer: Self-pay

## 2021-08-23 ENCOUNTER — Other Ambulatory Visit: Payer: Self-pay

## 2021-08-25 DIAGNOSIS — J449 Chronic obstructive pulmonary disease, unspecified: Secondary | ICD-10-CM | POA: Diagnosis not present

## 2021-08-25 DIAGNOSIS — U071 COVID-19: Secondary | ICD-10-CM | POA: Diagnosis not present

## 2021-09-21 ENCOUNTER — Other Ambulatory Visit: Payer: Self-pay

## 2021-09-22 ENCOUNTER — Other Ambulatory Visit: Payer: Self-pay

## 2021-10-17 ENCOUNTER — Other Ambulatory Visit: Payer: Self-pay

## 2021-11-08 ENCOUNTER — Ambulatory Visit (INDEPENDENT_AMBULATORY_CARE_PROVIDER_SITE_OTHER): Payer: Medicaid Other

## 2021-11-08 ENCOUNTER — Encounter: Payer: Self-pay | Admitting: Pulmonary Disease

## 2021-11-08 ENCOUNTER — Ambulatory Visit: Payer: Medicaid Other | Admitting: Pulmonary Disease

## 2021-11-08 VITALS — BP 144/78 | HR 79 | Temp 98.2°F | Ht 66.0 in | Wt 227.4 lb

## 2021-11-08 DIAGNOSIS — R0602 Shortness of breath: Secondary | ICD-10-CM | POA: Diagnosis not present

## 2021-11-08 DIAGNOSIS — J449 Chronic obstructive pulmonary disease, unspecified: Secondary | ICD-10-CM

## 2021-11-08 DIAGNOSIS — J439 Emphysema, unspecified: Secondary | ICD-10-CM | POA: Diagnosis not present

## 2021-11-08 NOTE — Patient Instructions (Signed)
I am glad you are stable with your breathing Continue inhalers as prescribed We will get a chest x-ray today Follow-up in 6 months with video visit

## 2021-11-08 NOTE — Addendum Note (Signed)
Addended by: Elton Sin on: 11/08/2021 10:21 AM   Modules accepted: Orders

## 2021-11-08 NOTE — Progress Notes (Signed)
[name] [name]    [id number]    [birth year]  Primary Care Physician:Newlin, Charlane Ferretti, MD  Referring Physician: Charlott Rakes, MD [place],  [place] [address]   Chief complaint:   Follow up for COPD GOLD B (Cat score 17, 0 exacerbations) Pulmonary HTN  HPI: [name] [name] is a 61 y.o.  with extensive smoking history. His main complaint is dyspnea on exertion for the past several years. He has chronic cough with mucus production. Denies any wheezing, hemoptysis. He was maintained initially on just albuterol rescue inhaler. He is maintained on spiriva. He feels very currently. He has dyspnea on exertion which is not different from baseline. Denies any cough, sputum production, hemoptysis, fever, weight loss.  He has over 25 years pack year smoking history. Quit in 2020   Interim history: Continues on Symbicort and Spiriva.  On supplemental oxygen He has been mostly at home over the past few years and hardly ventures out due to fear of contracting COVID-19  Outpatient Encounter Medications as of 04/17/2019  Medication Sig   albuterol (PROVENTIL) (2.5 MG/3ML) 0.083% nebulizer solution INHALE 3 MLS (1 VIAL) EVERY 6 HOURS VIA NEBULIAZATION ROUTE AS NEEDED FOR SHORTNESS OF BREATH (Patient taking differently: Take 2.5 mg by nebulization every 6 (six) hours as needed for shortness of breath. )   albuterol (VENTOLIN HFA) 108 (90 Base) MCG/ACT inhaler INHALE 2 PUFFS INTO THE LUNGS EVERY 4 HOURS AS NEEDED FOR WHEEZING OR SHORTNESS OF BREATH.   allopurinol (ZYLOPRIM) 100 MG tablet Take 1 tablet (100 mg total) by mouth daily.   amLODipine (NORVASC) 10 MG tablet Take 1 tablet (10 mg total) by mouth daily.   ammonium lactate (LAC-HYDRIN) 12 % lotion Apply 1 application topically daily.   AZO-CRANBERRY PO Take 2 tablets by mouth every 30 (thirty) days.   budesonide-formoterol (SYMBICORT) 160-4.5 MCG/ACT inhaler INAHLE 2 PUFFS INTO THE LUNGS TWICE DAILY.   Cyanocobalamin  (VITAMIN B-12) 1000 MCG SUBL Place 1 tablet (1,000 mcg total) under the tongue daily.   cyclobenzaprine (FLEXERIL) 5 MG tablet Take 1 tablet (5 mg total) by mouth 2 (two) times daily as needed for muscle spasms.   diphenhydrAMINE (BENADRYL ALLERGY) 25 MG tablet Take 50 mg by mouth daily as needed (allergies).    ezetimibe (ZETIA) 10 MG tablet Take 1 tablet (10 mg total) by mouth daily.   gabapentin (NEURONTIN) 300 MG capsule Take 1 capsule (300 mg total) by mouth at bedtime.   hydrocortisone 1 % ointment Apply 1 application topically 2 (two) times daily.   losartan (COZAAR) 50 MG tablet Take 1 tablet (50 mg total) by mouth daily. Needs PCP appointment.   metoprolol succinate (TOPROL-XL) 50 MG 24 hr tablet Take 1 tablet (50 mg total) by mouth daily.   tiotropium (SPIRIVA HANDIHALER) 18 MCG inhalation capsule Place 1 capsule (18 mcg total) into inhaler and inhale daily.   triamcinolone ointment (KENALOG) 0.1 % APPLY 1 APPLICATION TOPICALLY 2 TIMES DAILY. (Patient taking differently: Apply 1 application topically 2 (two) times daily. )   [DISCONTINUED] ALBUTEROL IN Inhale into the lungs.   No facility-administered encounter medications on file as of 04/17/2019.   Physical Exam: Blood pressure (!) 144/78, pulse 79, temperature 98.2 F (36.8 C), temperature source Oral, height '5\' 6"'$  (1.676 m), weight 227 lb 6.4 oz (103.1 kg), SpO2 99 %. Gen:      No acute distress HEENT:  EOMI, sclera anicteric Neck:     No masses;  no thyromegaly Lungs:    Clear to auscultation bilaterally; normal respiratory effort CV:         Regular rate and rhythm; no murmurs Abd:      + bowel sounds; soft, non-tender; no palpable masses, no distension Ext:    No edema; adequate peripheral perfusion Skin:      Warm and dry; no rash Neuro: alert and oriented x 3 Psych: normal mood and affect   Data Reviewed: Imaging CTA scan 12/13/13-no PE, mild airway thickening, atelectasis, cardiomegaly   CT chest 10/30/2018-emphysema in  the upper lobes.  Mild lower lobe atelectasis I have reviewed the images personally.  PFTs. 07/22/15 2.19 [60%], FEV1 1.07 [37%],/49, DLCO 10.99 [38%] Severe obstruction with severe diffusion impairment   Cardiac Echo 10/27/14 Normal LV function; mild LVH and LVE; grade 1 diastolic dysfunction; moderate LAE; mild RAE; trace TR; severely elevated pulmonary pressure. PAP 72  Echo 07/21/2016 LVEF 44-81%, grade 1 diastolic dysfunction, PAP 42  Sleep study 06/15/15 No significant obstructive sleep apnea, moderate O2 desaturation  Labs Alpha-1 antitrypsin 06/16/2016-137, PI MM HIV 07/06/2016-negative SSB 7.9, double-stranded DNA, SSA, rheumatoid factor-negative Hepatic function panel 07/17/2017-negative  Assessment:  Severe COPD Continue Symbicort, Spiriva, rescue inhaler Get chest x-ray today  Pulmonary hypertension. 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  Continue supplemental oxygen Repeat echo in 2018 shows improvement in PA pressures Work-up for alternative PAH etiologies including HIV, LFTs, ANA, RF, CCP is negative.  He has mild elevation in SSB which is likely nonspecific. We will continue to monitor  Heavy ex-smoker. We had referred for low-dose screening CT of the chest but he does not want to leave home due to fear of COVID-19 infection.  We had an extensive discussion today and he does not want to know if he has any cancer and does not want to get treatment with radiation or chemotherapy even if malignancy is detected  Health maintenance Prevnar 12/22/2013 Pneumovax 07/30/2012 He does not want to get the flu vaccine.  Plan/Recommendations: - Continue spiriva, symbicort - Continue supplemental O2  [name] Garfinkel MD [facility] 11/08/2021, 10:00 AM  CC: Charlott Rakes, MD

## 2021-11-22 ENCOUNTER — Telehealth: Payer: Self-pay | Admitting: Pulmonary Disease

## 2021-11-22 ENCOUNTER — Other Ambulatory Visit: Payer: Self-pay | Admitting: Pulmonary Disease

## 2021-11-22 ENCOUNTER — Other Ambulatory Visit: Payer: Self-pay | Admitting: Family Medicine

## 2021-11-22 ENCOUNTER — Other Ambulatory Visit: Payer: Self-pay

## 2021-11-22 DIAGNOSIS — J449 Chronic obstructive pulmonary disease, unspecified: Secondary | ICD-10-CM

## 2021-11-22 MED ORDER — SPIRIVA HANDIHALER 18 MCG IN CAPS
1.0000 | ORAL_CAPSULE | Freq: Every day | RESPIRATORY_TRACT | 11 refills | Status: DC
  Filled 2021-11-22: qty 30, 30d supply, fill #0
  Filled 2021-12-26: qty 30, 30d supply, fill #1
  Filled 2022-01-25: qty 30, 30d supply, fill #2
  Filled 2022-02-24: qty 30, 30d supply, fill #3
  Filled 2022-03-27: qty 30, 30d supply, fill #4
  Filled 2022-04-24: qty 30, 30d supply, fill #5
  Filled 2022-05-22: qty 30, 30d supply, fill #6
  Filled 2022-06-22: qty 30, 30d supply, fill #7
  Filled 2022-07-25 (×2): qty 30, 30d supply, fill #8
  Filled 2022-08-25: qty 30, 30d supply, fill #9
  Filled 2022-09-26: qty 30, 30d supply, fill #10
  Filled 2022-10-30: qty 30, 30d supply, fill #11

## 2021-11-22 NOTE — Telephone Encounter (Signed)
Medication Refill - Medication: spiriva albuterol (VENTOLIN HFA) 108 (90 Base) MCG/ACT inhaler/tiotropium (SPIRIVA HANDIHALER) 18 /MCG inhalation capsule /budesonide-formoterol (SYMBICORT) 160-4.5 MCG/ACT inhaler [[id number]] Patient is out of these Has the patient contacted their pharmacy? yes (Agent: If no, request that the patient contact the pharmacy for the refill. If patient does not wish to contact the pharmacy document the reason why and proceed with request.) (Agent: If yes, when and what did the pharmacy advise?)no answer, rang and goes to vm      Preferred Pharmacy (with phone number or street name):  [facility] Phone:  [phone]  Fax:  [phone]     Has the patient been seen for an appointment in the last year OR does the patient have an upcoming appointment? yes  Agent: Please be advised that RX refills may take up to 3 business days. We ask that you follow-up with your pharmacy.

## 2021-11-22 NOTE — Telephone Encounter (Signed)
Requested medication (s) are due for refill today:yes  Requested medication (s) are on the active medication list: yes    Last refill: Albuterol  01/17/21 18G 9 refills     Budesonide  11/08/20 10.2  11 refills    Tiotropium  11/08/20  #30 11 refills  Future visit scheduled no  Notes to clinic:Please review, Historical Provider.  Requested Prescriptions  Pending Prescriptions Disp Refills   albuterol (VENTOLIN HFA) 108 (90 Base) MCG/ACT inhaler 18 g 9    Sig: inhale 2 pufss every 6 hours as needed for wheezing or shortness of breath     Pulmonology:  Beta Agonists 2 Failed - 11/22/2021 12:35 PM      Failed - Last BP in normal range    BP Readings from Last 1 Encounters:  11/08/21 (!) 144/78         Passed - Last Heart Rate in normal range    Pulse Readings from Last 1 Encounters:  11/08/21 79         Passed - Valid encounter within last 12 months    Recent Outpatient Visits           7 months ago Chronic respiratory failure with hypoxia ([place])   [place], Charlane Ferretti, MD   10 months ago Essential hypertension   Primary Care at [facility], Kriste Basque, NP   1 year ago Influenza vaccine refused   [place] Charlott Rakes, MD   2 years ago Screening for colon cancer   [place] [facility], Charlane Ferretti, MD   2 years ago [place], [place], MD               budesonide-formoterol (SYMBICORT) 160-4.5 MCG/ACT inhaler 10.2 g 11    Sig: INHALE 2 PUFFS INTO THE LUNGS TWICE DAILY.     Pulmonology:  Combination Products Passed - 11/22/2021 12:35 PM      Passed - Valid encounter within last 12 months    Recent Outpatient Visits           7 months ago Chronic respiratory failure with hypoxia ([place])   [place] Charlott Rakes, MD   10 months ago Essential hypertension   Primary Care at [facility], Kriste Basque, NP   1 year ago Influenza vaccine refused   [place], Enobong, MD   2 years ago Screening for colon cancer   [place], Enobong, MD   2 years ago [place], Charlane Ferretti, MD               tiotropium (SPIRIVA HANDIHALER) 18 MCG inhalation capsule 30 capsule 11    Sig: PLACE 1 CAPSULE (18 MCG TOTAL) INTO INHALER AND INHALE DAILY.     Pulmonology:  Anticholinergic Agents Passed - 11/22/2021 12:35 PM      Passed - Valid encounter within last 12 months    Recent Outpatient Visits           7 months ago Chronic respiratory failure with hypoxia [facility])   [place], En[place]ong, MD   10 months ago Essential hypertension   Primary Care at [facility], Kriste Basque, NP   1 year ago Influenza vaccine refused   [facility] Charlott Rakes, MD   2 years ago Screening for colon cancer   [place], Enobong, MD   2 years ago [place], Enobong, MD

## 2021-11-22 NOTE — Telephone Encounter (Signed)
Refill addressed earlier today.

## 2021-11-23 ENCOUNTER — Other Ambulatory Visit: Payer: Self-pay

## 2021-12-01 ENCOUNTER — Telehealth: Payer: Self-pay | Admitting: Pulmonary Disease

## 2021-12-01 NOTE — Telephone Encounter (Signed)
I called and reviewed results of chest x-ray 11/08/2021 There are mild bibasilar opacities suggestive atelectasis/infection or scarring.  He does not have any symptoms of pneumonia.  I did recommend that he get CT scan for better evaluation but similar discussions at last clinic visit he does not want to get the CT scan We will repeat a chest x-ray at return visit.

## 2021-12-05 ENCOUNTER — Other Ambulatory Visit: Payer: Self-pay

## 2021-12-05 ENCOUNTER — Other Ambulatory Visit: Payer: Self-pay | Admitting: Pulmonary Disease

## 2021-12-05 ENCOUNTER — Other Ambulatory Visit: Payer: Self-pay | Admitting: Family Medicine

## 2021-12-05 DIAGNOSIS — I10 Essential (primary) hypertension: Secondary | ICD-10-CM

## 2021-12-05 DIAGNOSIS — E7800 Pure hypercholesterolemia, unspecified: Secondary | ICD-10-CM

## 2021-12-05 DIAGNOSIS — J449 Chronic obstructive pulmonary disease, unspecified: Secondary | ICD-10-CM

## 2021-12-05 DIAGNOSIS — M1A9XX0 Chronic gout, unspecified, without tophus (tophi): Secondary | ICD-10-CM

## 2021-12-05 MED ORDER — BUDESONIDE-FORMOTEROL FUMARATE 160-4.5 MCG/ACT IN AERO
INHALATION_SPRAY | RESPIRATORY_TRACT | 5 refills | Status: DC
  Filled 2021-12-05: qty 10.2, 30d supply, fill #0
  Filled 2021-12-26 – 2022-01-25 (×2): qty 10.2, 30d supply, fill #1
  Filled 2022-02-24: qty 10.2, 30d supply, fill #2
  Filled 2022-03-27: qty 10.2, 30d supply, fill #3
  Filled 2022-04-24: qty 10.2, 30d supply, fill #4
  Filled 2022-05-22: qty 10.2, 30d supply, fill #5

## 2021-12-05 MED ORDER — ALBUTEROL SULFATE HFA 108 (90 BASE) MCG/ACT IN AERS
INHALATION_SPRAY | RESPIRATORY_TRACT | 5 refills | Status: DC
  Filled 2021-12-05: qty 18, 25d supply, fill #0
  Filled 2021-12-26: qty 18, 25d supply, fill #1
  Filled 2022-01-25: qty 18, 25d supply, fill #2
  Filled 2022-02-24: qty 18, 25d supply, fill #3
  Filled 2022-03-27: qty 18, 25d supply, fill #4
  Filled 2022-04-24: qty 18, 25d supply, fill #5

## 2021-12-06 ENCOUNTER — Other Ambulatory Visit: Payer: Self-pay

## 2021-12-06 MED ORDER — EZETIMIBE 10 MG PO TABS
10.0000 mg | ORAL_TABLET | Freq: Every day | ORAL | 0 refills | Status: DC
  Filled 2021-12-06: qty 30, 30d supply, fill #0

## 2021-12-06 MED ORDER — METOPROLOL SUCCINATE ER 50 MG PO TB24
50.0000 mg | ORAL_TABLET | Freq: Every day | ORAL | 0 refills | Status: DC
  Filled 2021-12-06: qty 30, 30d supply, fill #0

## 2021-12-06 MED ORDER — AMLODIPINE BESYLATE 10 MG PO TABS
10.0000 mg | ORAL_TABLET | Freq: Every day | ORAL | 0 refills | Status: DC
  Filled 2021-12-06: qty 30, 30d supply, fill #0

## 2021-12-06 MED ORDER — LOSARTAN POTASSIUM 50 MG PO TABS
50.0000 mg | ORAL_TABLET | Freq: Every day | ORAL | 0 refills | Status: DC
  Filled 2021-12-06: qty 30, 30d supply, fill #0

## 2021-12-06 MED ORDER — ALLOPURINOL 100 MG PO TABS
100.0000 mg | ORAL_TABLET | Freq: Every day | ORAL | 0 refills | Status: DC
  Filled 2021-12-06: qty 30, 30d supply, fill #0

## 2021-12-06 NOTE — Telephone Encounter (Signed)
Called pt and made him an appt with Dr. Margarita Rana with [facility] for 03/14/2022 at 1:30 for his 6 month check.  Gave him a 30 day courtesy supply of his medications because the rx runs out before 90 days.

## 2021-12-26 ENCOUNTER — Other Ambulatory Visit: Payer: Self-pay

## 2021-12-27 ENCOUNTER — Other Ambulatory Visit: Payer: Self-pay

## 2022-01-09 ENCOUNTER — Other Ambulatory Visit: Payer: Self-pay

## 2022-01-09 ENCOUNTER — Other Ambulatory Visit: Payer: Self-pay | Admitting: Family Medicine

## 2022-01-09 DIAGNOSIS — M1A9XX0 Chronic gout, unspecified, without tophus (tophi): Secondary | ICD-10-CM

## 2022-01-09 DIAGNOSIS — E7800 Pure hypercholesterolemia, unspecified: Secondary | ICD-10-CM

## 2022-01-09 DIAGNOSIS — I10 Essential (primary) hypertension: Secondary | ICD-10-CM

## 2022-01-09 NOTE — Telephone Encounter (Signed)
Pt called to follow up on med request.  Pt states he is out of these meds and hopes it will not take 3 days.

## 2022-01-10 ENCOUNTER — Other Ambulatory Visit: Payer: Self-pay

## 2022-01-10 MED ORDER — AMLODIPINE BESYLATE 10 MG PO TABS
10.0000 mg | ORAL_TABLET | Freq: Every day | ORAL | 0 refills | Status: DC
  Filled 2022-01-10: qty 30, 30d supply, fill #0

## 2022-01-10 MED ORDER — METOPROLOL SUCCINATE ER 50 MG PO TB24
50.0000 mg | ORAL_TABLET | Freq: Every day | ORAL | 0 refills | Status: DC
  Filled 2022-01-10: qty 30, 30d supply, fill #0

## 2022-01-10 MED ORDER — EZETIMIBE 10 MG PO TABS
10.0000 mg | ORAL_TABLET | Freq: Every day | ORAL | 0 refills | Status: DC
  Filled 2022-01-10: qty 30, 30d supply, fill #0

## 2022-01-10 MED ORDER — LOSARTAN POTASSIUM 50 MG PO TABS
50.0000 mg | ORAL_TABLET | Freq: Every day | ORAL | 1 refills | Status: DC
  Filled 2022-01-10: qty 30, 30d supply, fill #0
  Filled 2022-02-10: qty 30, 30d supply, fill #1

## 2022-01-10 MED ORDER — ALLOPURINOL 100 MG PO TABS
100.0000 mg | ORAL_TABLET | Freq: Every day | ORAL | 0 refills | Status: DC
  Filled 2022-01-10: qty 30, 30d supply, fill #0

## 2022-01-10 NOTE — Telephone Encounter (Signed)
Requested medication (s) are due for refill today:   Yes for all 5  Requested medication (s) are on the active medication list:   Yes for all 5   Future visit scheduled:   Yes 03/14/2022  That was Dr. Smitty Pluck next available   Last ordered: All 5 were given a 30 day courtesy refill on 12/06/2021.   He is out of his medications.  Returned because a 30 day courtesy refill has been given and his appt. Is 2 months away.   Provider to review for refills prior to upcoming appt.    Requested Prescriptions  Pending Prescriptions Disp Refills   amLODipine (NORVASC) 10 MG tablet 30 tablet 0    Sig: Take 1 tablet (10 mg total) by mouth daily.     Cardiovascular: Calcium Channel Blockers 2 Failed - 01/09/2022  9:48 AM      Failed - Last BP in normal range    BP Readings from Last 1 Encounters:  11/08/21 (!) 144/78         Failed - Valid encounter within last 6 months    Recent Outpatient Visits           8 months ago Chronic respiratory failure with hypoxia ([place])   [place] Charlott Rakes, MD   11 months ago Essential hypertension   Primary Care at [facility], Kriste Basque, NP   1 year ago Influenza vaccine refused   [place], Enobong, MD   2 years ago Screening for colon cancer   [place], Enobong, MD   3 years ago [place], Charlane Ferretti, MD       Future Appointments             In 2 months Charlott Rakes, MD [place] - Last Heart Rate in normal range    Pulse Readings from Last 1 Encounters:  11/08/21 79          ezetimibe (ZETIA) 10 MG tablet 30 tablet 0    Sig: Take 1 tablet (10 mg total) by mouth daily.     Cardiovascular:  Antilipid - Sterol Transport Inhibitors Failed - 01/09/2022  9:48 AM      Failed - AST in normal range and within 360 days    AST   Date Value Ref Range Status  12/12/2020 38 15 - 41 U/L Final         Failed - ALT in normal range and within 360 days    ALT  Date Value Ref Range Status  12/12/2020 109 (H) 0 - 44 U/L Final         Failed - Lipid Panel in normal range within the last 12 months    Cholesterol, Total  Date Value Ref Range Status  06/08/2020 222 (H) 100 - 199 mg/dL Final   LDL Chol Calc (NIH)  Date Value Ref Range Status  06/08/2020 161 (H) 0 - 99 mg/dL Final   HDL  Date Value Ref Range Status  06/08/2020 48 >39 mg/dL Final   Triglycerides  Date Value Ref Range Status  06/08/2020 76 0 - 149 mg/dL Final         Passed - Patient is not pregnant      Passed - Valid encounter within last 12 months  Recent Outpatient Visits           8 months ago Chronic respiratory failure with hypoxia [facility])   [place] Charlott Rakes, MD   11 months ago Essential hypertension   Primary Care at [facility], Kriste Basque, NP   1 year ago Influenza vaccine refused   [place], Enobong, MD   2 years ago Screening for colon cancer   [place], Enobong, MD   3 years ago [place], Charlane Ferretti, MD       Future Appointments             In 2 months Charlott Rakes, MD [place]             allopurinol (ZYLOPRIM) 100 MG tablet 30 tablet 0    Sig: Take 1 tablet (100 mg total) by mouth daily.     Endocrinology:  Gout Agents - allopurinol Failed - 01/09/2022  9:48 AM      Failed - Uric Acid in normal range and within 360 days    Uric Acid, Serum  Date Value Ref Range Status  09/17/2015 7.2 4.0 - 8.0 mg/dL Final    Comment:    Therapeutic target for gout patients: <6.0 mg/dL         Failed - Cr in normal range and within 360 days    Creat  Date Value Ref Range Status  09/17/2015 1.50 (H) 0.70 - 1.33 mg/dL  Final   Creatinine, Ser  Date Value Ref Range Status  12/12/2020 1.07 0.61 - 1.24 mg/dL Final   Creatinine, Urine  Date Value Ref Range Status  10/26/2014 64.52 mg/dL Final         Failed - CBC within normal limits and completed in the last 12 months    WBC  Date Value Ref Range Status  12/14/2020 11.1 (H) 4.0 - 10.5 K/uL Final   RBC  Date Value Ref Range Status  12/14/2020 5.72 4.22 - 5.81 MIL/uL Final   Hemoglobin  Date Value Ref Range Status  12/14/2020 13.8 13.0 - 17.0 g/dL Final  06/08/2020 14.7 13.0 - 17.7 g/dL Final   HCT  Date Value Ref Range Status  12/14/2020 44.9 39.0 - 52.0 % Final   Hematocrit  Date Value Ref Range Status  06/08/2020 46.6 37.5 - 51.0 % Final   MCHC  Date Value Ref Range Status  12/14/2020 30.7 30.0 - 36.0 g/dL Final   [facility]  Date Value Ref Range Status  12/14/2020 24.1 (L) 26.0 - 34.0 pg Final   MCV  Date Value Ref Range Status  12/14/2020 78.5 (L) 80.0 - 100.0 fL Final  06/08/2020 77 (L) 79 - 97 fL Final   No results found for: "PLTCOUNTKUC", "LABPLAT", "POCPLA" RDW  Date Value Ref Range Status  12/14/2020 13.6 11.5 - 15.5 % Final  06/08/2020 12.3 11.6 - 15.4 % Final         Passed - Valid encounter within last 12 months    Recent Outpatient Visits           8 months ago Chronic respiratory failure with hypoxia ([place])   [place], En[place]ong, MD   11 months ago Essential hypertension   Primary Care at [facility], Kriste Basque, NP   1 year ago Influenza vaccine refused   [facility]  [place] Charlott Rakes, MD   2 years ago Screening for colon cancer   [place], Charlane Ferretti, MD   3 years ago [place], Charlane Ferretti, MD       Future Appointments             In 2 months Charlott Rakes, MD [place]             metoprolol succinate  (TOPROL-XL) 50 MG 24 hr tablet 30 tablet 0    Sig: Take 1 tablet (50 mg total) by mouth daily.     Cardiovascular:  Beta Blockers Failed - 01/09/2022  9:48 AM      Failed - Last BP in normal range    BP Readings from Last 1 Encounters:  11/08/21 (!) 144/78         Failed - Valid encounter within last 6 months    Recent Outpatient Visits           8 months ago Chronic respiratory failure with hypoxia ([place])   [place] Charlott Rakes, MD   11 months ago Essential hypertension   Primary Care at [facility], Kriste Basque, NP   1 year ago Influenza vaccine refused   [place], Enobong, MD   2 years ago Screening for colon cancer   [place], Enobong, MD   3 years ago [place], Charlane Ferretti, MD       Future Appointments             In 2 months Charlott Rakes, MD [place] - Last Heart Rate in normal range    Pulse Readings from Last 1 Encounters:  11/08/21 79          losartan (COZAAR) 50 MG tablet 30 tablet 0    Sig: Take 1 tablet (50 mg total) by mouth daily.     Cardiovascular:  Angiotensin Receptor Blockers Failed - 01/09/2022  9:48 AM      Failed - Cr in normal range and within 180 days    Creat  Date Value Ref Range Status  09/17/2015 1.50 (H) 0.70 - 1.33 mg/dL Final   Creatinine, Ser  Date Value Ref Range Status  12/12/2020 1.07 0.61 - 1.24 mg/dL Final   Creatinine, Urine  Date Value Ref Range Status  10/26/2014 64.52 mg/dL Final         Failed - K in normal range and within 180 days    Potassium  Date Value Ref Range Status  12/12/2020 3.8 3.5 - 5.1 mmol/L Final         Failed - Last BP in normal range    BP Readings from Last 1 Encounters:  11/08/21 (!) 144/78         Failed - Valid encounter within last 6 months    Recent Outpatient  Visits           8 months ago Chronic respiratory failure with hypoxia ([place])   [place], Enobong, MD   11 months ago Essential hypertension   [facility] at [facility], Kriste Basque, NP   1 year ago Influenza vaccine refused   [place], MD   2 years ago Screening for colon cancer   [place], MD   3 years ago [place], MD       Future Appointments             In 2 months Charlott Rakes, MD [place] - Patient is not pregnant

## 2022-01-11 ENCOUNTER — Other Ambulatory Visit: Payer: Self-pay

## 2022-01-25 ENCOUNTER — Other Ambulatory Visit: Payer: Self-pay

## 2022-01-26 ENCOUNTER — Other Ambulatory Visit: Payer: Self-pay

## 2022-02-10 ENCOUNTER — Other Ambulatory Visit: Payer: Self-pay | Admitting: Family Medicine

## 2022-02-10 ENCOUNTER — Other Ambulatory Visit: Payer: Self-pay

## 2022-02-10 DIAGNOSIS — M1A9XX0 Chronic gout, unspecified, without tophus (tophi): Secondary | ICD-10-CM

## 2022-02-10 DIAGNOSIS — I10 Essential (primary) hypertension: Secondary | ICD-10-CM

## 2022-02-10 DIAGNOSIS — E7800 Pure hypercholesterolemia, unspecified: Secondary | ICD-10-CM

## 2022-02-10 MED ORDER — AMLODIPINE BESYLATE 10 MG PO TABS
10.0000 mg | ORAL_TABLET | Freq: Every day | ORAL | 0 refills | Status: DC
  Filled 2022-02-10: qty 30, 30d supply, fill #0

## 2022-02-10 MED ORDER — EZETIMIBE 10 MG PO TABS
10.0000 mg | ORAL_TABLET | Freq: Every day | ORAL | 0 refills | Status: DC
  Filled 2022-02-10: qty 30, 30d supply, fill #0

## 2022-02-10 MED ORDER — METOPROLOL SUCCINATE ER 50 MG PO TB24
50.0000 mg | ORAL_TABLET | Freq: Every day | ORAL | 0 refills | Status: DC
  Filled 2022-02-10: qty 30, 30d supply, fill #0

## 2022-02-10 MED ORDER — ALLOPURINOL 100 MG PO TABS
100.0000 mg | ORAL_TABLET | Freq: Every day | ORAL | 0 refills | Status: DC
  Filled 2022-02-10: qty 30, 30d supply, fill #0

## 2022-02-10 NOTE — Telephone Encounter (Signed)
Requested medication (s) are due for refill today -yes  Requested medication (s) are on the active medication list -yes  Future visit scheduled -yes  Last refill: 01/10/22 #30-courtesy  Notes to clinic: Patient has been scheduled and given courtesy RF- needs extension for appointment in December- sent to office for review   Requested Prescriptions  Pending Prescriptions Disp Refills   amLODipine (NORVASC) 10 MG tablet 30 tablet 0    Sig: Take 1 tablet (10 mg total) by mouth daily. (Must keep upcoming office visit for refills. )     Cardiovascular: Calcium Channel Blockers 2 Failed - 02/10/2022  9:25 AM      Failed - Last BP in normal range    BP Readings from Last 1 Encounters:  11/08/21 (!) 144/78         Failed - Valid encounter within last 6 months    Recent Outpatient Visits           9 months ago Chronic respiratory failure with hypoxia ([place])   [place] [facility] Charlott Rakes, MD   1 year ago Essential hypertension   Primary Care at [facility], Kriste Basque, NP   1 year ago Influenza vaccine refused   [place], Enobong, MD   2 years ago Screening for colon cancer   [place], Enobong, MD   3 years ago [place], Enobong, MD       Future Appointments             In 1 month Charlott Rakes, MD [place] - Last Heart Rate in normal range    Pulse Readings from Last 1 Encounters:  11/08/21 79          ezetimibe (ZETIA) 10 MG tablet 30 tablet 0    Sig: Take 1 tablet (10 mg total) by mouth daily. (Must keep upcoming office visit for refills. )     Cardiovascular:  Antilipid - Sterol Transport Inhibitors Failed - 02/10/2022  9:25 AM      Failed - AST in normal range and within 360 days    AST  Date Value Ref Range Status  12/12/2020 38 15 - 41 U/L Final          Failed - ALT in normal range and within 360 days    ALT  Date Value Ref Range Status  12/12/2020 109 (H) 0 - 44 U/L Final         Failed - Lipid Panel in normal range within the last 12 months    Cholesterol, Total  Date Value Ref Range Status  06/08/2020 222 (H) 100 - 199 mg/dL Final   LDL Chol Calc (NIH)  Date Value Ref Range Status  06/08/2020 161 (H) 0 - 99 mg/dL Final   HDL  Date Value Ref Range Status  06/08/2020 48 >39 mg/dL Final   Triglycerides  Date Value Ref Range Status  06/08/2020 76 0 - 149 mg/dL Final         Passed - Patient is not pregnant      Passed - Valid encounter within last 12 months    Recent Outpatient Visits           9 months ago Chronic respiratory failure with hypoxia ([place])   [place] [facility] Charlott Rakes, MD   1 year ago Essential hypertension   Primary Care at [facility], Kriste Basque, NP   1 year ago Influenza vaccine refused   [place], Enobong, MD   2 years ago Screening for colon cancer   [place], Enobong, MD   3 years ago [place], Charlane Ferretti, MD       Future Appointments             In 1 month Charlott Rakes, MD [place]             allopurinol (ZYLOPRIM) 100 MG tablet 30 tablet 0    Sig: Take 1 tablet (100 mg total) by mouth daily. (Must keep upcoming office visit for refills.)     Endocrinology:  Gout Agents - allopurinol Failed - 02/10/2022  9:25 AM      Failed - Uric Acid in normal range and within 360 days    Uric Acid, Serum  Date Value Ref Range Status  09/17/2015 7.2 4.0 - 8.0 mg/dL Final    Comment:    Therapeutic target for gout patients: <6.0 mg/dL         Failed - Cr in normal range and within 360 days    Creat  Date Value Ref Range Status  09/17/2015 1.50 (H) 0.70 - 1.33 mg/dL Final   Creatinine,  Ser  Date Value Ref Range Status  12/12/2020 1.07 0.61 - 1.24 mg/dL Final   Creatinine, Urine  Date Value Ref Range Status  10/26/2014 64.52 mg/dL Final         Failed - CBC within normal limits and completed in the last 12 months    WBC  Date Value Ref Range Status  12/14/2020 11.1 (H) 4.0 - 10.5 K/uL Final   RBC  Date Value Ref Range Status  12/14/2020 5.72 4.22 - 5.81 MIL/uL Final   Hemoglobin  Date Value Ref Range Status  12/14/2020 13.8 13.0 - 17.0 g/dL Final  06/08/2020 14.7 13.0 - 17.7 g/dL Final   HCT  Date Value Ref Range Status  12/14/2020 44.9 39.0 - 52.0 % Final   Hematocrit  Date Value Ref Range Status  06/08/2020 46.6 37.5 - 51.0 % Final   MCHC  Date Value Ref Range Status  12/14/2020 30.7 30.0 - 36.0 g/dL Final   [facility]  Date Value Ref Range Status  12/14/2020 24.1 (L) 26.0 - 34.0 pg Final   MCV  Date Value Ref Range Status  12/14/2020 78.5 (L) 80.0 - 100.0 fL Final  06/08/2020 77 (L) 79 - 97 fL Final   No results found for: "PLTCOUNTKUC", "LABPLAT", "POCPLA" RDW  Date Value Ref Range Status  12/14/2020 13.6 11.5 - 15.5 % Final  06/08/2020 12.3 11.6 - 15.4 % Final         Passed - Valid encounter within last 12 months    Recent Outpatient Visits           9 months ago Chronic respiratory failure with hypoxia ([place])   [place], Enobong, MD   1 year ago Essential hypertension   Primary Care at [facility], Kriste Basque, NP   1 year ago Influenza vaccine refused   [place], Enobong, MD   2 years ago Screening for colon cancer   [place], Enobong, MD   3 years ago [place], Charlane Ferretti, MD       Future Appointments             In 1 month Charlott Rakes, MD [place]             metoprolol succinate (TOPROL-XL) 50 MG 24 hr tablet 30  tablet 0    Sig: Take 1 tablet (50 mg total) by mouth daily. (Must keep upcoming office visit for refills. )     Cardiovascular:  Beta Blockers Failed - 02/10/2022  9:25 AM      Failed - Last BP in normal range    BP Readings from Last 1 Encounters:  11/08/21 (!) 144/78         Failed - Valid encounter within last 6 months    Recent Outpatient Visits           9 months ago Chronic respiratory failure with hypoxia ([place])   [place] [facility] Charlott Rakes, MD   1 year ago Essential hypertension   Primary Care at [facility], Kriste Basque, NP   1 year ago Influenza vaccine refused   [place], Enobong, MD   2 years ago Screening for colon cancer   [place], Enobong, MD   3 years ago [place], Enobong, MD       Future Appointments             In 1 month Charlott Rakes, MD [place] - Last Heart Rate in normal range    Pulse Readings from Last 1 Encounters:  11/08/21 79            Requested Prescriptions  Pending Prescriptions Disp Refills   amLODipine (NORVASC) 10 MG tablet 30 tablet 0    Sig: Take 1 tablet (10 mg total) by mouth daily. (Must keep upcoming office visit for refills. )     Cardiovascular: Calcium Channel Blockers 2 Failed - 02/10/2022  9:25 AM      Failed - Last BP in normal range    BP Readings from Last 1 Encounters:  11/08/21 (!) 144/78         Failed - Valid encounter within last 6 months    Recent Outpatient Visits           9 months ago Chronic respiratory failure with hypoxia ([place])    [facility] Charlott Rakes, MD   1 year ago Essential hypertension   Primary Care at [facility], Kriste Basque, NP   1 year ago Influenza vaccine refused   [place], Enobong,  MD   2 years ago Screening for colon cancer   [place], MD   3 years ago [place], Enobong, MD       Future Appointments             In 1 month Charlott Rakes, MD [place] - Last Heart Rate in normal range    Pulse Readings  from Last 1 Encounters:  11/08/21 79          ezetimibe (ZETIA) 10 MG tablet 30 tablet 0    Sig: Take 1 tablet (10 mg total) by mouth daily. (Must keep upcoming office visit for refills. )     Cardiovascular:  Antilipid - Sterol Transport Inhibitors Failed - 02/10/2022  9:25 AM      Failed - AST in normal range and within 360 days    AST  Date Value Ref Range Status  12/12/2020 38 15 - 41 U/L Final         Failed - ALT in normal range and within 360 days    ALT  Date Value Ref Range Status  12/12/2020 109 (H) 0 - 44 U/L Final         Failed - Lipid Panel in normal range within the last 12 months    Cholesterol, Total  Date Value Ref Range Status  06/08/2020 222 (H) 100 - 199 mg/dL Final   LDL Chol Calc (NIH)  Date Value Ref Range Status  06/08/2020 161 (H) 0 - 99 mg/dL Final   HDL  Date Value Ref Range Status  06/08/2020 48 >39 mg/dL Final   Triglycerides  Date Value Ref Range Status  06/08/2020 76 0 - 149 mg/dL Final         Passed - Patient is not pregnant      Passed - Valid encounter within last 12 months    Recent Outpatient Visits           9 months ago Chronic respiratory failure with hypoxia ([place])   [place] [facility] [place], Charlane Ferretti, MD   1 year ago Essential hypertension   Primary Care at [facility], Kriste Basque, NP   1 year ago Influenza vaccine refused   [place], Enobong, MD   2 years ago Screening for colon cancer   [place], Enobong, MD   3 years ago [place], Charlane Ferretti, MD       Future Appointments             In 1 month Charlott Rakes, MD [place]             allopurinol (ZYLOPRIM) 100 MG tablet 30 tablet 0    Sig: Take 1 tablet (100 mg total) by mouth daily. (Must keep upcoming office visit for refills.)     Endocrinology:  Gout Agents - allopurinol Failed - 02/10/2022  9:25 AM      Failed - Uric Acid in normal range and within 360 days    Uric Acid, Serum  Date Value Ref Range Status  09/17/2015 7.2 4.0 - 8.0 mg/dL Final    Comment:    Therapeutic target for gout patients: <6.0 mg/dL         Failed - Cr in normal range and within 360 days    Creat  Date Value Ref Range Status  09/17/2015 1.50 (H) 0.70 - 1.33 mg/dL Final   Creatinine, Ser  Date Value Ref Range Status  12/12/2020 1.07 0.61 - 1.24 mg/dL Final   Creatinine, Urine  Date Value Ref Range Status  10/26/2014 64.52 mg/dL Final         Failed - CBC within normal limits and completed in the last 12 months    WBC  Date Value Ref  Range Status  12/14/2020 11.1 (H) 4.0 - 10.5 K/uL Final   RBC  Date Value Ref Range Status  12/14/2020 5.72 4.22 - 5.81 MIL/uL Final   Hemoglobin  Date Value Ref Range Status  12/14/2020 13.8 13.0 - 17.0 g/dL Final  06/08/2020 14.7 13.0 - 17.7 g/dL Final   HCT  Date Value Ref Range Status  12/14/2020 44.9 39.0 - 52.0 % Final   Hematocrit  Date Value Ref Range Status  06/08/2020 46.6 37.5 - 51.0 % Final   MCHC  Date Value Ref Range Status  12/14/2020 30.7 30.0 - 36.0 g/dL Final   [facility]  Date Value Ref Range Status  12/14/2020 24.1 (L) 26.0 - 34.0 pg Final   MCV  Date Value Ref Range Status  12/14/2020 78.5 (L) 80.0 - 100.0 fL Final  06/08/2020 77 (L) 79 - 97 fL Final   No results found for: "PLTCOUNTKUC", "LABPLAT", "POCPLA" RDW  Date Value Ref Range Status  12/14/2020 13.6 11.5 - 15.5 % Final  06/08/2020 12.3 11.6 - 15.4 % Final          Passed - Valid encounter within last 12 months    Recent Outpatient Visits           9 months ago Chronic respiratory failure with hypoxia ([place])   [place] [facility] Charlott Rakes, MD   1 year ago Essential hypertension   Primary Care at [facility], Kriste Basque, NP   1 year ago Influenza vaccine refused   [place], Enobong, MD   2 years ago Screening for colon cancer   [place], Charlane Ferretti, MD   3 years ago [place], Charlane Ferretti, MD       Future Appointments             In 1 month Charlott Rakes, MD [place]             metoprolol succinate (TOPROL-XL) 50 MG 24 hr tablet 30 tablet 0    Sig: Take 1 tablet (50 mg total) by mouth daily. (Must keep upcoming office visit for refills. )     Cardiovascular:  Beta Blockers Failed - 02/10/2022  9:25 AM      Failed - Last BP in normal range    BP Readings from Last 1 Encounters:  11/08/21 (!) 144/78         Failed - Valid encounter within last 6 months    Recent Outpatient Visits           9 months ago Chronic respiratory failure with hypoxia [facility])   [place] [facility] Charlott Rakes, MD   1 year ago Essential hypertension   Primary Care at [facility], Kriste Basque, NP   1 year ago Influenza vaccine refused   [place], Enobong, MD   2 years ago Screening for colon cancer   [place], MD   3 years ago [place], Enobong, MD       Future Appointments             In 1 month Charlott Rakes, MD [place] - Last Heart Rate in normal range  Pulse Readings from Last 1 Encounters:  11/08/21 79

## 2022-02-14 ENCOUNTER — Other Ambulatory Visit: Payer: Self-pay

## 2022-02-24 ENCOUNTER — Other Ambulatory Visit: Payer: Self-pay

## 2022-03-14 ENCOUNTER — Other Ambulatory Visit: Payer: Self-pay

## 2022-03-14 ENCOUNTER — Encounter: Payer: Self-pay | Admitting: Family Medicine

## 2022-03-14 ENCOUNTER — Ambulatory Visit: Payer: Medicaid Other | Attending: Family Medicine | Admitting: Family Medicine

## 2022-03-14 VITALS — BP 128/75 | HR 81 | Temp 98.2°F | Ht 66.0 in | Wt 220.8 lb

## 2022-03-14 DIAGNOSIS — Z131 Encounter for screening for diabetes mellitus: Secondary | ICD-10-CM

## 2022-03-14 DIAGNOSIS — I10 Essential (primary) hypertension: Secondary | ICD-10-CM | POA: Diagnosis not present

## 2022-03-14 DIAGNOSIS — J9611 Chronic respiratory failure with hypoxia: Secondary | ICD-10-CM

## 2022-03-14 DIAGNOSIS — J449 Chronic obstructive pulmonary disease, unspecified: Secondary | ICD-10-CM | POA: Diagnosis not present

## 2022-03-14 DIAGNOSIS — M1A9XX0 Chronic gout, unspecified, without tophus (tophi): Secondary | ICD-10-CM

## 2022-03-14 MED ORDER — AMLODIPINE BESYLATE 10 MG PO TABS
10.0000 mg | ORAL_TABLET | Freq: Every day | ORAL | 1 refills | Status: DC
  Filled 2022-03-14: qty 90, 90d supply, fill #0
  Filled 2022-06-22: qty 90, 90d supply, fill #1

## 2022-03-14 MED ORDER — METOPROLOL SUCCINATE ER 50 MG PO TB24
50.0000 mg | ORAL_TABLET | Freq: Every day | ORAL | 1 refills | Status: DC
  Filled 2022-03-14 (×2): qty 90, 90d supply, fill #0
  Filled 2022-06-22: qty 90, 90d supply, fill #1

## 2022-03-14 MED ORDER — LOSARTAN POTASSIUM 50 MG PO TABS
50.0000 mg | ORAL_TABLET | Freq: Every day | ORAL | 1 refills | Status: DC
  Filled 2022-03-14: qty 90, 90d supply, fill #0

## 2022-03-14 MED ORDER — ALLOPURINOL 100 MG PO TABS
100.0000 mg | ORAL_TABLET | Freq: Every day | ORAL | 1 refills | Status: DC
  Filled 2022-03-14: qty 90, 90d supply, fill #0
  Filled 2022-06-22: qty 90, 90d supply, fill #1

## 2022-03-14 NOTE — Patient Instructions (Signed)
Exercising to Stay Healthy To become healthy and stay healthy, it is recommended that you do moderate-intensity and vigorous-intensity exercise. You can tell that you are exercising at a moderate intensity if your heart starts beating faster and you start breathing faster but can still hold a conversation. You can tell that you are exercising at a vigorous intensity if you are breathing much harder and faster and cannot hold a conversation while exercising. How can exercise benefit me? Exercising regularly is important. It has many health benefits, such as: Improving overall fitness, flexibility, and endurance. Increasing bone density. Helping with weight control. Decreasing body fat. Increasing muscle strength and endurance. Reducing stress and tension, anxiety, depression, or anger. Improving overall health. What guidelines should I follow while exercising? Before you start a new exercise program, talk with your health care provider. Do not exercise so much that you hurt yourself, feel dizzy, or get very short of breath. Wear comfortable clothes and wear shoes with good support. Drink plenty of water while you exercise to prevent dehydration or heat stroke. Work out until your breathing and your heartbeat get faster (moderate intensity). How often should I exercise? Choose an activity that you enjoy, and set realistic goals. Your health care provider can help you make an activity plan that is individually designed and works best for you. Exercise regularly as told by your health care provider. This may include: Doing strength training two times a week, such as: Lifting weights. Using resistance bands. Push-ups. Sit-ups. Yoga. Doing a certain intensity of exercise for a given amount of time. Choose from these options: A total of 150 minutes of moderate-intensity exercise every week. A total of 75 minutes of vigorous-intensity exercise every week. A mix of moderate-intensity and  vigorous-intensity exercise every week. Children, pregnant women, people who have not exercised regularly, people who are overweight, and older adults may need to talk with a health care provider about what activities are safe to perform. If you have a medical condition, be sure to talk with your health care provider before you start a new exercise program. What are some exercise ideas? Moderate-intensity exercise ideas include: Walking 1 mile (1.6 km) in about 15 minutes. Biking. Hiking. Golfing. Dancing. Water aerobics. Vigorous-intensity exercise ideas include: Walking 4.5 miles (7.2 km) or more in about 1 hour. Jogging or running 5 miles (8 km) in about 1 hour. Biking 10 miles (16.1 km) or more in about 1 hour. Lap swimming. Roller-skating or in-line skating. Cross-country skiing. Vigorous competitive sports, such as football, basketball, and soccer. Jumping rope. Aerobic dancing. What are some everyday activities that can help me get exercise? Yard work, such as: Pushing a lawn mower. Raking and bagging leaves. Washing your car. Pushing a stroller. Shoveling snow. Gardening. Washing windows or floors. How can I be more active in my day-to-day activities? Use stairs instead of an elevator. Take a walk during your lunch break. If you drive, park your car farther away from your work or school. If you take public transportation, get off one stop early and walk the rest of the way. Stand up or walk around during all of your indoor phone calls. Get up, stretch, and walk around every 30 minutes throughout the day. Enjoy exercise with a friend. Support to continue exercising will help you keep a regular routine of activity. Where to find more information You can find more information about exercising to stay healthy from: [place] Department of Health and Human Services: www.hhs.gov Centers for Disease Control and Prevention (  CDC): www.cdc.gov Summary Exercising regularly is  important. It will improve your overall fitness, flexibility, and endurance. Regular exercise will also improve your overall health. It can help you control your weight, reduce stress, and improve your bone density. Do not exercise so much that you hurt yourself, feel dizzy, or get very short of breath. Before you start a new exercise program, talk with your health care provider. This information is not intended to replace advice given to you by your health care provider. Make sure you discuss any questions you have with your health care provider. Document Revised: 07/23/2020 Document Reviewed: 07/23/2020 Elsevier Patient Education  2023 Elsevier Inc.  

## 2022-03-14 NOTE — Progress Notes (Signed)
Subjective:  Patient ID: [name] [name], male    DOB: 06-23-1960  Age: 61 y.o. MRN: [id number]  CC: Hypertension   HPI [name] [name] is a 61 y.o. year old male with a history of end-stage COPD (on 6 L of oxygen at rest and 8 L with ambulation), gout, ulnar neuropathy, hypertension, Psoriasis.   Interval History:  He had a visit with his pulmonologist Dr. Vaughan Browner 4 months ago. He gets dyspneic on exertion.  He drinks cherry juice when he has a gout flare up but overall has been doing good. He is adherent with Allopurinol. Labs from 12/2021 revealed elevated ALT Denies alcohol intake.  He is adherent with his antihypertensive.  He is not able to exercise much besides moving around at home which he states is his major form of exercise. Past Medical History:  Diagnosis Date   Asthma 04/10/1997   CKD (chronic kidney disease), 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   COPD (chronic obstructive pulmonary disease) (HCC)    on 4L home O2   Essential hypertension    Gout    Thrombocytopenia ([place]) 07/28/2012    Past Surgical History:  Procedure Laterality Date   FRACTURE SURGERY  childhood   right arm   KNEE SURGERY     right knee s/p trauma    Family History  Problem Relation Age of Onset   Cancer Mother        colon    Heart disease Father    Colon cancer Other        mother ? age of diagnosis    Healthy Son    Healthy Daughter     Social History   Socioeconomic History   Marital status: Legally Separated    Spouse name: Not on file   Number of children: 5    Years of education: 11   Highest education level: Not on file  Occupational History   Occupation: Unemployed   Tobacco Use   Smoking status: Former    Packs/day: 1.00    Years: 45.00    Total pack years: 45.00    Types: Cigarettes    Quit date: 2021    Years since quitting: 2.9   Smokeless tobacco: Never   Tobacco comments:    'Quitting"  Vaping Use   Vaping Use: Never used  Substance and Sexual Activity   Alcohol use: Yes     Alcohol/week: 0.0 standard drinks of alcohol    Comment: occaisional beer - previously one beer daily, stopped 4 months   Drug use: No    Types: Cocaine    Comment: remote use   Sexual activity: Yes    Birth control/protection: Condom  Other Topics Concern   Not on file  Social History Narrative   Lives with daughter and grandkids x 2. 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    Smokes 1ppd since age 11 y.o    Drinks 2 times per week 40 oz beer. Denies liquor, wine. History of cocaine use. Denies IVDU   Has an orange card    Sexually active with 1 partner uses protection             Social Determinants of Health   Financial Resource Strain: Not on file  Food Insecurity: Not on file  Transportation Needs: Not on file  Physical Activity: Not on file  Stress: Not on file  Social Connections: Not on file  Allergies  Allergen Reactions   Atorvastatin Itching and Swelling    Facial, tongue swelling   Shellfish Allergy Anaphylaxis   Colchicine Hives    Outpatient Medications Prior to Visit  Medication Sig Dispense Refill   albuterol (VENTOLIN HFA) 108 (90 Base) MCG/ACT inhaler inhale 2 pufss every 6 hours as needed for wheezing or shortness of breath 18 g 5   ammonium lactate (LAC-HYDRIN) 12 % lotion Apply 1 application topically daily as needed (psoriasis).     Apremilast (OTEZLA) 30 MG TABS Take 30 mg by mouth 2 (two) times daily.     Ascorbic Acid (VITAMIN C) 1000 MG tablet Take 1,000 mg by mouth daily.     budesonide-formoterol (SYMBICORT) 160-4.5 MCG/ACT inhaler INHALE 2 PUFFS INTO THE LUNGS TWICE DAILY. 10.2 g 5   cyclobenzaprine (FLEXERIL) 5 MG tablet Take 1 tablet (5 mg total) by mouth 2 (two) times daily as needed for muscle spasms. 20 tablet 0   diphenhydrAMINE (BENADRYL) 25 MG tablet Take 50 mg by mouth daily as needed (allergies).      ezetimibe (ZETIA) 10 MG tablet Take 1 tablet (10 mg total)  by mouth daily. (Must keep upcoming office visit for refills. ) 30 tablet 0   guaiFENesin-dextromethorphan (ROBITUSSIN DM) 100-10 MG/5ML syrup Take 10 mLs by mouth every 4 (four) hours as needed for cough. 118 mL 0   hydrocortisone 1 % ointment Apply 1 application topically 2 (two) times daily. 30 g 0   ibuprofen (ADVIL) 200 MG tablet Take 800 mg by mouth daily as needed (gout swelling).     molnupiravir EUA 200 mg CAPS Take 4 capsules by mouth 2 (two) times daily.     predniSONE (DELTASONE) 20 MG tablet Take 1 tablet (20 mg total) by mouth daily. 4 tablet 0   tiotropium (SPIRIVA HANDIHALER) 18 MCG inhalation capsule PLACE 1 CAPSULE (18 MCG TOTAL) INTO INHALER AND INHALE DAILY. 30 capsule 11   triamcinolone ointment (KENALOG) 0.1 % APPLY 1 APPLICATION TOPICALLY 2 TIMES DAILY. 80 g 0   vitamin B-12 (CYANOCOBALAMIN) 500 MCG tablet Take 500 mcg by mouth daily.     allopurinol (ZYLOPRIM) 100 MG tablet Take 1 tablet (100 mg total) by mouth daily. (Must keep upcoming office visit for refills.) 30 tablet 0   amLODipine (NORVASC) 10 MG tablet Take 1 tablet (10 mg total) by mouth daily. (Must keep upcoming office visit for refills. ) 30 tablet 0   losartan (COZAAR) 50 MG tablet Take 1 tablet (50 mg total) by mouth daily. (Must keep upcoming office visit for refills.) 30 tablet 1   metoprolol succinate (TOPROL-XL) 50 MG 24 hr tablet Take 1 tablet (50 mg total) by mouth daily. (Must keep upcoming office visit for refills. ) 30 tablet 0   albuterol (PROVENTIL) (2.5 MG/3ML) 0.083% nebulizer solution TAKE 3 MLS (2.5 MG TOTAL) BY NEBULIZATION EVERY 6 (SIX) HOURS AS NEEDED FOR SHORTNESS OF BREATH. (Patient taking differently: Take 2.5 mg by nebulization every 6 (six) hours as needed for wheezing or shortness of breath.) 360 mL 11   gabapentin (NEURONTIN) 300 MG capsule TAKE 1 CAPSULE (300 MG TOTAL) BY MOUTH AT BEDTIME. (Patient taking differently: Take 300 mg by mouth at bedtime as needed (pain).) 90 capsule 1   No  facility-administered medications prior to visit.     ROS Review of Systems  Constitutional:  Negative for activity change and appetite change.  HENT:  Negative for sinus pressure and sore throat.   Respiratory:  Positive for shortness of  breath. Negative for chest tightness and wheezing.   Cardiovascular:  Negative for chest pain and palpitations.  Gastrointestinal:  Negative for abdominal distention, abdominal pain and constipation.  Genitourinary: Negative.   Musculoskeletal: Negative.   Psychiatric/Behavioral:  Negative for behavioral problems and dysphoric mood.     Objective:  BP 128/75   Pulse 81   Temp 98.2 F (36.8 C) (Oral)   Ht _0  (1.676 m)   Wt 220 lb 12.8 oz (100.2 kg)   SpO2 97%   BMI 35.64 kg/m      03/14/2022    1:43 PM 11/08/2021    9:53 AM 12/14/2020    8:00 AM  BP/Weight  Systolic BP 431 540 086  Diastolic BP 75 78 93  Wt. (Lbs) 220.8 227.4   BMI 35.64 kg/m2 36.7 kg/m2       Physical Exam Constitutional:      Appearance: He is well-developed.  HENT:     Nose:     Comments: 6 L oxygen via nasal cannula Cardiovascular:     Rate and Rhythm: Normal rate.     Heart sounds: Normal heart sounds. No murmur heard. Pulmonary:     Effort: Pulmonary effort is normal.     Breath sounds: Normal breath sounds. No wheezing or rales.  Chest:     Chest wall: No tenderness.  Abdominal:     General: Bowel sounds are normal. There is no distension.     Palpations: Abdomen is soft. There is no mass.     Tenderness: There is no abdominal tenderness.  Musculoskeletal:        General: Normal range of motion.     Right lower leg: No edema.     Left lower leg: No edema.  Neurological:     Mental Status: He is alert and oriented to person, place, and time.  Psychiatric:        Mood and Affect: Mood normal.        Latest Ref Rng & Units 12/12/2020    2:54 AM 12/11/2020    2:07 AM 12/10/2020    3:51 AM  CMP  Glucose 70 - 99 mg/dL 109  97  102   BUN 8 - 23  mg/dL _1 Creatinine 0.61 - 1.24 mg/dL 1.07  1.09  1.09   Sodium 135 - 145 mmol/L 136  137  136   Potassium 3.5 - 5.1 mmol/L 3.8  4.3  4.1   Chloride 98 - 111 mmol/L 98  95  97   CO2 22 - 32 mmol/L 29  37  36   Calcium 8.9 - 10.3 mg/dL 8.2  8.5  8.3   Total Protein 6.5 - 8.1 g/dL 5.8  5.7  5.9   Total Bilirubin 0.3 - 1.2 mg/dL 0.9  0.6  0.8   Alkaline Phos 38 - 126 U/L 50  52  53   AST 15 - 41 U/L 38  44  44   ALT 0 - 44 U/L 109  117  114     Lipid Panel     Component Value Date/Time   CHOL 222 (H) 06/08/2020 1047   TRIG 76 06/08/2020 1047   HDL 48 06/08/2020 1047   CHOLHDL 4.6 06/08/2020 1047   CHOLHDL 4.1 10/26/2014 0429   VLDL 10 10/26/2014 0429   LDLCALC 161 (H) 06/08/2020 1047    CBC    Component Value Date/Time   WBC 11.1 (H) 12/14/2020 0336   RBC  5.72 12/14/2020 0336   HGB 13.8 12/14/2020 0336   HGB 14.7 06/08/2020 1047   HCT 44.9 12/14/2020 0336   HCT 46.6 06/08/2020 1047   PLT 319 12/14/2020 0336   PLT 249 06/08/2020 1047   MCV 78.5 (L) 12/14/2020 0336   MCV 77 (L) 06/08/2020 1047   MCH 24.1 (L) 12/14/2020 0336   MCHC 30.7 12/14/2020 0336   RDW 13.6 12/14/2020 0336   RDW 12.3 06/08/2020 1047   LYMPHSABS 3.1 12/14/2020 0336   LYMPHSABS 2.4 06/08/2020 1047   MONOABS 1.9 (H) 12/14/2020 0336   EOSABS 0.0 12/14/2020 0336   EOSABS 0.2 06/08/2020 1047   BASOSABS 0.0 12/14/2020 0336   BASOSABS 0.0 06/08/2020 1047    Lab Results  Component Value Date   HGBA1C 5.2 10/26/2014    Assessment & Plan:  1. Chronic gout without tophus, unspecified cause, unspecified site Stable with infrequent flares Discussed low purine eating plan which she has been adherent with - allopurinol (ZYLOPRIM) 100 MG tablet; Take 1 tablet (100 mg total) by mouth daily.  Dispense: 90 tablet; Refill: 1  2. Essential hypertension Controlled Counseled on blood pressure goal of less than 130/80, low-sodium, DASH diet, medication compliance, 150 minutes of moderate intensity  exercise per week. Discussed medication compliance, adverse effects. - amLODipine (NORVASC) 10 MG tablet; Take 1 tablet (10 mg total) by mouth daily.  Dispense: 90 tablet; Refill: 1 - losartan (COZAAR) 50 MG tablet; Take 1 tablet (50 mg total) by mouth daily.  Dispense: 90 tablet; Refill: 1 - metoprolol succinate (TOPROL-XL) 50 MG 24 hr tablet; Take 1 tablet (50 mg total) by mouth daily.  Dispense: 90 tablet; Refill: 1 - CMP14+EGFR  3. Screening for diabetes mellitus - Hemoglobin A1c  4. Chronic respiratory failure with hypoxia (HCC) Secondary to COPD Currently on 6 L of oxygen at rest, 8 L on exertion  5. COPD without exacerbation ([place]) Advanced COPD Currently on SAMA, SABA, ICS/LABA Continue to follow-up with pulmonary  Health Care Maintenance: Counseled on need for pneumonia vaccine, flu vaccine but he declines.  He also declines lung cancer screening, colorectal cancer screening as he does not want to be made aware of any malignancy.  Meds ordered this encounter  Medications   allopurinol (ZYLOPRIM) 100 MG tablet    Sig: Take 1 tablet (100 mg total) by mouth daily.    Dispense:  90 tablet    Refill:  1   amLODipine (NORVASC) 10 MG tablet    Sig: Take 1 tablet (10 mg total) by mouth daily.    Dispense:  90 tablet    Refill:  1   losartan (COZAAR) 50 MG tablet    Sig: Take 1 tablet (50 mg total) by mouth daily.    Dispense:  90 tablet    Refill:  1   metoprolol succinate (TOPROL-XL) 50 MG 24 hr tablet    Sig: Take 1 tablet (50 mg total) by mouth daily.    Dispense:  90 tablet    Refill:  1    Follow-up: Return in about 6 months (around 09/13/2022).       Charlott Rakes, MD, FAAFP. [facility] and [facility], [place]   03/14/2022, 4:54 PM

## 2022-03-15 ENCOUNTER — Other Ambulatory Visit: Payer: Self-pay

## 2022-03-15 ENCOUNTER — Other Ambulatory Visit: Payer: Self-pay | Admitting: Family Medicine

## 2022-03-15 DIAGNOSIS — E7800 Pure hypercholesterolemia, unspecified: Secondary | ICD-10-CM

## 2022-03-15 LAB — CMP14+EGFR
ALT: 12 IU/L (ref 0–44)
AST: 13 IU/L (ref 0–40)
Albumin/Globulin Ratio: 1.5 (ref 1.2–2.2)
Albumin: 4.4 g/dL (ref 3.9–4.9)
Alkaline Phosphatase: 88 IU/L (ref 44–121)
BUN/Creatinine Ratio: 7 — ABNORMAL LOW (ref 10–24)
BUN: 8 mg/dL (ref 8–27)
Bilirubin Total: 0.5 mg/dL (ref 0.0–1.2)
CO2: 33 mmol/L — ABNORMAL HIGH (ref 20–29)
Calcium: 10.1 mg/dL (ref 8.6–10.2)
Chloride: 93 mmol/L — ABNORMAL LOW (ref 96–106)
Creatinine, Ser: 1.18 mg/dL (ref 0.76–1.27)
Globulin, Total: 2.9 g/dL (ref 1.5–4.5)
Glucose: 108 mg/dL — ABNORMAL HIGH (ref 70–99)
Potassium: 4.1 mmol/L (ref 3.5–5.2)
Sodium: 143 mmol/L (ref 134–144)
Total Protein: 7.3 g/dL (ref 6.0–8.5)
eGFR: 69 mL/min/{1.73_m2} (ref >59)

## 2022-03-15 LAB — HEMOGLOBIN A1C
Est. average glucose Bld gHb Est-mCnc: 97 mg/dL
Hgb A1c MFr Bld: 5 % (ref 4.8–5.6)

## 2022-03-15 MED ORDER — EZETIMIBE 10 MG PO TABS
10.0000 mg | ORAL_TABLET | Freq: Every day | ORAL | 3 refills | Status: DC
  Filled 2022-03-15: qty 30, 30d supply, fill #0
  Filled 2022-04-24: qty 30, 30d supply, fill #1
  Filled 2022-05-22: qty 30, 30d supply, fill #2
  Filled 2022-06-22: qty 30, 30d supply, fill #3

## 2022-03-20 ENCOUNTER — Other Ambulatory Visit: Payer: Self-pay

## 2022-03-27 ENCOUNTER — Other Ambulatory Visit: Payer: Self-pay | Admitting: Family Medicine

## 2022-03-27 ENCOUNTER — Other Ambulatory Visit: Payer: Self-pay

## 2022-03-27 DIAGNOSIS — J449 Chronic obstructive pulmonary disease, unspecified: Secondary | ICD-10-CM

## 2022-03-27 MED ORDER — ALBUTEROL SULFATE (2.5 MG/3ML) 0.083% IN NEBU
INHALATION_SOLUTION | RESPIRATORY_TRACT | 1 refills | Status: DC
  Filled 2022-03-27: qty 360, 30d supply, fill #0
  Filled 2022-07-25 (×2): qty 360, 30d supply, fill #1

## 2022-03-28 ENCOUNTER — Other Ambulatory Visit: Payer: Self-pay

## 2022-04-24 ENCOUNTER — Other Ambulatory Visit: Payer: Self-pay

## 2022-04-26 ENCOUNTER — Other Ambulatory Visit: Payer: Self-pay

## 2022-05-10 ENCOUNTER — Other Ambulatory Visit: Payer: Self-pay

## 2022-05-10 ENCOUNTER — Emergency Department (HOSPITAL_COMMUNITY): Payer: Medicaid Other

## 2022-05-10 ENCOUNTER — Inpatient Hospital Stay (HOSPITAL_COMMUNITY)
Admission: EM | Admit: 2022-05-10 | Discharge: 2022-05-14 | DRG: 177 | Disposition: A | Discharge status: Home / Self Care | Payer: Medicaid Other | Attending: Internal Medicine | Admitting: Internal Medicine

## 2022-05-10 ENCOUNTER — Encounter (HOSPITAL_COMMUNITY): Payer: Self-pay | Admitting: Emergency Medicine

## 2022-05-10 DIAGNOSIS — I129 Hypertensive chronic kidney disease with stage 1 through stage 4 chronic kidney disease, or unspecified chronic kidney disease: Secondary | ICD-10-CM | POA: Diagnosis present

## 2022-05-10 DIAGNOSIS — Z87891 Personal history of nicotine dependence: Secondary | ICD-10-CM | POA: Diagnosis not present

## 2022-05-10 DIAGNOSIS — R000 Tachycardia, unspecified: Secondary | ICD-10-CM | POA: Diagnosis not present

## 2022-05-10 DIAGNOSIS — E66812 Obesity, class 2: Secondary | ICD-10-CM | POA: Diagnosis present

## 2022-05-10 DIAGNOSIS — T380X5A Adverse effect of glucocorticoids and synthetic analogues, initial encounter: Secondary | ICD-10-CM | POA: Diagnosis not present

## 2022-05-10 DIAGNOSIS — J441 Chronic obstructive pulmonary disease with (acute) exacerbation: Secondary | ICD-10-CM | POA: Diagnosis not present

## 2022-05-10 DIAGNOSIS — N182 Chronic kidney disease, stage 2 (mild): Secondary | ICD-10-CM | POA: Diagnosis present

## 2022-05-10 DIAGNOSIS — D72828 Other elevated white blood cell count: Secondary | ICD-10-CM | POA: Diagnosis not present

## 2022-05-10 DIAGNOSIS — E7800 Pure hypercholesterolemia, unspecified: Secondary | ICD-10-CM

## 2022-05-10 DIAGNOSIS — J439 Emphysema, unspecified: Secondary | ICD-10-CM | POA: Diagnosis not present

## 2022-05-10 DIAGNOSIS — R0602 Shortness of breath: Secondary | ICD-10-CM | POA: Diagnosis not present

## 2022-05-10 DIAGNOSIS — N179 Acute kidney failure, unspecified: Secondary | ICD-10-CM | POA: Diagnosis not present

## 2022-05-10 DIAGNOSIS — I10 Essential (primary) hypertension: Secondary | ICD-10-CM | POA: Diagnosis present

## 2022-05-10 DIAGNOSIS — Z91013 Allergy to seafood: Secondary | ICD-10-CM

## 2022-05-10 DIAGNOSIS — I272 Pulmonary hypertension, unspecified: Secondary | ICD-10-CM | POA: Diagnosis not present

## 2022-05-10 DIAGNOSIS — Z8249 Family history of ischemic heart disease and other diseases of the circulatory system: Secondary | ICD-10-CM

## 2022-05-10 DIAGNOSIS — M109 Gout, unspecified: Secondary | ICD-10-CM | POA: Diagnosis present

## 2022-05-10 DIAGNOSIS — N1831 Chronic kidney disease, stage 3a: Secondary | ICD-10-CM | POA: Diagnosis present

## 2022-05-10 DIAGNOSIS — R0603 Acute respiratory distress: Secondary | ICD-10-CM | POA: Diagnosis not present

## 2022-05-10 DIAGNOSIS — M1A9XX0 Chronic gout, unspecified, without tophus (tophi): Secondary | ICD-10-CM | POA: Diagnosis not present

## 2022-05-10 DIAGNOSIS — Z9981 Dependence on supplemental oxygen: Secondary | ICD-10-CM | POA: Diagnosis not present

## 2022-05-10 DIAGNOSIS — Z6834 Body mass index (BMI) 34.0-34.9, adult: Secondary | ICD-10-CM

## 2022-05-10 DIAGNOSIS — E669 Obesity, unspecified: Secondary | ICD-10-CM | POA: Diagnosis present

## 2022-05-10 DIAGNOSIS — E785 Hyperlipidemia, unspecified: Secondary | ICD-10-CM | POA: Diagnosis not present

## 2022-05-10 DIAGNOSIS — U071 COVID-19: Secondary | ICD-10-CM | POA: Diagnosis not present

## 2022-05-10 DIAGNOSIS — J440 Chronic obstructive pulmonary disease with acute lower respiratory infection: Secondary | ICD-10-CM | POA: Diagnosis not present

## 2022-05-10 DIAGNOSIS — R059 Cough, unspecified: Secondary | ICD-10-CM | POA: Diagnosis not present

## 2022-05-10 DIAGNOSIS — J1282 Pneumonia due to coronavirus disease 2019: Secondary | ICD-10-CM | POA: Diagnosis present

## 2022-05-10 DIAGNOSIS — J9621 Acute and chronic respiratory failure with hypoxia: Secondary | ICD-10-CM | POA: Diagnosis present

## 2022-05-10 DIAGNOSIS — Z800 Family history of malignant neoplasm of digestive organs: Secondary | ICD-10-CM | POA: Diagnosis not present

## 2022-05-10 LAB — COMPREHENSIVE METABOLIC PANEL
ALT: 14 U/L (ref 0–44)
AST: 21 U/L (ref 15–41)
Albumin: 3.4 g/dL — ABNORMAL LOW (ref 3.5–5.0)
Alkaline Phosphatase: 72 U/L (ref 38–126)
Anion gap: 8 (ref 5–15)
BUN: 8 mg/dL (ref 8–23)
CO2: 34 mmol/L — ABNORMAL HIGH (ref 22–32)
Calcium: 9.3 mg/dL (ref 8.9–10.3)
Chloride: 93 mmol/L — ABNORMAL LOW (ref 98–111)
Creatinine, Ser: 1.11 mg/dL (ref 0.61–1.24)
GFR, Estimated: >60 mL/min (ref >60)
Glucose, Bld: 100 mg/dL — ABNORMAL HIGH (ref 70–99)
Potassium: 4.9 mmol/L (ref 3.5–5.1)
Sodium: 135 mmol/L (ref 135–145)
Total Bilirubin: 1.3 mg/dL — ABNORMAL HIGH (ref 0.3–1.2)
Total Protein: 7.4 g/dL (ref 6.5–8.1)

## 2022-05-10 LAB — CBC WITH DIFFERENTIAL/PLATELET
Abs Immature Granulocytes: 0.04 10*3/uL (ref 0.00–0.07)
Basophils Absolute: 0.1 10*3/uL (ref 0.0–0.1)
Basophils Relative: 1 %
Eosinophils Absolute: 0.1 10*3/uL (ref 0.0–0.5)
Eosinophils Relative: 1 %
HCT: 43.7 % (ref 39.0–52.0)
Hemoglobin: 12.8 g/dL — ABNORMAL LOW (ref 13.0–17.0)
Immature Granulocytes: 0 %
Lymphocytes Relative: 8 %
Lymphs Abs: 0.8 10*3/uL (ref 0.7–4.0)
MCH: 24.1 pg — ABNORMAL LOW (ref 26.0–34.0)
MCHC: 29.3 g/dL — ABNORMAL LOW (ref 30.0–36.0)
MCV: 82.3 fL (ref 80.0–100.0)
Monocytes Absolute: 1.8 10*3/uL — ABNORMAL HIGH (ref 0.1–1.0)
Monocytes Relative: 17 %
Neutro Abs: 7.5 10*3/uL (ref 1.7–7.7)
Neutrophils Relative %: 73 %
Platelets: 215 10*3/uL (ref 150–400)
RBC: 5.31 MIL/uL (ref 4.22–5.81)
RDW: 12.6 % (ref 11.5–15.5)
WBC: 10.2 10*3/uL (ref 4.0–10.5)
nRBC: 0 % (ref 0.0–0.2)

## 2022-05-10 LAB — RESP PANEL BY RT-PCR (RSV, FLU A&B, COVID)  RVPGX2
Influenza A by PCR: NEGATIVE
Influenza B by PCR: NEGATIVE
Resp Syncytial Virus by PCR: NEGATIVE
SARS Coronavirus 2 by RT PCR: POSITIVE — AB

## 2022-05-10 LAB — FERRITIN: Ferritin: 128 ng/mL (ref 24–336)

## 2022-05-10 LAB — C-REACTIVE PROTEIN: CRP: 3.3 mg/dL — ABNORMAL HIGH (ref <1.0)

## 2022-05-10 LAB — BRAIN NATRIURETIC PEPTIDE: B Natriuretic Peptide: 31.5 pg/mL (ref 0.0–100.0)

## 2022-05-10 MED ORDER — HYDROCOD POLI-CHLORPHE POLI ER 10-8 MG/5ML PO SUER
5.0000 mL | Freq: Two times a day (BID) | ORAL | Status: DC | PRN
  Administered 2022-05-11 – 2022-05-13 (×3): 5 mL via ORAL
  Filled 2022-05-10 (×3): qty 5

## 2022-05-10 MED ORDER — POLYETHYLENE GLYCOL 3350 17 G PO PACK
17.0000 g | PACK | Freq: Every day | ORAL | Status: DC | PRN
  Administered 2022-05-13: 17 g via ORAL
  Filled 2022-05-10: qty 1

## 2022-05-10 MED ORDER — GUAIFENESIN-DM 100-10 MG/5ML PO SYRP
10.0000 mL | ORAL_SOLUTION | ORAL | Status: DC | PRN
  Administered 2022-05-10 – 2022-05-14 (×8): 10 mL via ORAL
  Filled 2022-05-10 (×8): qty 10

## 2022-05-10 MED ORDER — MOMETASONE FURO-FORMOTEROL FUM 200-5 MCG/ACT IN AERO
2.0000 | INHALATION_SPRAY | Freq: Two times a day (BID) | RESPIRATORY_TRACT | Status: DC
  Administered 2022-05-10 – 2022-05-11 (×2): 2 via RESPIRATORY_TRACT
  Filled 2022-05-10: qty 8.8

## 2022-05-10 MED ORDER — ALLOPURINOL 100 MG PO TABS
100.0000 mg | ORAL_TABLET | Freq: Every day | ORAL | Status: DC
  Administered 2022-05-11 – 2022-05-14 (×4): 100 mg via ORAL
  Filled 2022-05-10 (×4): qty 1

## 2022-05-10 MED ORDER — LOSARTAN POTASSIUM 50 MG PO TABS
50.0000 mg | ORAL_TABLET | Freq: Every day | ORAL | Status: DC
  Administered 2022-05-11 – 2022-05-13 (×3): 50 mg via ORAL
  Filled 2022-05-10 (×3): qty 1

## 2022-05-10 MED ORDER — METHYLPREDNISOLONE SODIUM SUCC 125 MG IJ SOLR
125.0000 mg | Freq: Once | INTRAMUSCULAR | Status: AC
  Administered 2022-05-10: 125 mg via INTRAVENOUS
  Filled 2022-05-10: qty 2

## 2022-05-10 MED ORDER — TOCILIZUMAB 400 MG/20ML IV SOLN
800.0000 mg | Freq: Once | INTRAVENOUS | Status: AC
  Administered 2022-05-10: 800 mg via INTRAVENOUS
  Filled 2022-05-10: qty 40

## 2022-05-10 MED ORDER — AMLODIPINE BESYLATE 10 MG PO TABS
10.0000 mg | ORAL_TABLET | Freq: Every day | ORAL | Status: DC
  Administered 2022-05-11 – 2022-05-14 (×4): 10 mg via ORAL
  Filled 2022-05-10 (×4): qty 1

## 2022-05-10 MED ORDER — UMECLIDINIUM BROMIDE 62.5 MCG/ACT IN AEPB
1.0000 | INHALATION_SPRAY | Freq: Every day | RESPIRATORY_TRACT | Status: DC
  Administered 2022-05-10 – 2022-05-14 (×4): 1 via RESPIRATORY_TRACT
  Filled 2022-05-10: qty 7

## 2022-05-10 MED ORDER — ACETAMINOPHEN 325 MG PO TABS: 650.0000 mg | ORAL_TABLET | Freq: Four times a day (QID) | ORAL | Status: DC | PRN

## 2022-05-10 MED ORDER — EZETIMIBE 10 MG PO TABS
10.0000 mg | ORAL_TABLET | Freq: Every day | ORAL | Status: DC
  Administered 2022-05-11 – 2022-05-14 (×4): 10 mg via ORAL
  Filled 2022-05-10 (×4): qty 1

## 2022-05-10 MED ORDER — ZINC SULFATE 220 (50 ZN) MG PO CAPS
220.0000 mg | ORAL_CAPSULE | Freq: Every day | ORAL | Status: DC
  Administered 2022-05-10 – 2022-05-14 (×5): 220 mg via ORAL
  Filled 2022-05-10 (×5): qty 1

## 2022-05-10 MED ORDER — ALBUTEROL SULFATE (2.5 MG/3ML) 0.083% IN NEBU
INHALATION_SOLUTION | RESPIRATORY_TRACT | Status: AC
  Administered 2022-05-10: 10 mg
  Filled 2022-05-10: qty 12

## 2022-05-10 MED ORDER — ENOXAPARIN SODIUM 40 MG/0.4ML IJ SOSY
40.0000 mg | PREFILLED_SYRINGE | INTRAMUSCULAR | Status: DC
  Administered 2022-05-10 – 2022-05-12 (×3): 40 mg via SUBCUTANEOUS
  Filled 2022-05-10 (×3): qty 0.4

## 2022-05-10 MED ORDER — VITAMIN C 500 MG PO TABS
500.0000 mg | ORAL_TABLET | Freq: Every day | ORAL | Status: DC
  Administered 2022-05-10 – 2022-05-14 (×5): 500 mg via ORAL
  Filled 2022-05-10 (×5): qty 1

## 2022-05-10 MED ORDER — METOPROLOL SUCCINATE ER 50 MG PO TB24
50.0000 mg | ORAL_TABLET | Freq: Every day | ORAL | Status: DC
  Administered 2022-05-11 – 2022-05-14 (×4): 50 mg via ORAL
  Filled 2022-05-10 (×4): qty 1

## 2022-05-10 MED ORDER — ALBUTEROL (5 MG/ML) CONTINUOUS INHALATION SOLN: 10.0000 mg/h | INHALATION_SOLUTION | Freq: Once | RESPIRATORY_TRACT | Status: DC

## 2022-05-10 MED ORDER — ALBUTEROL SULFATE (2.5 MG/3ML) 0.083% IN NEBU
2.5000 mg | INHALATION_SOLUTION | RESPIRATORY_TRACT | Status: DC | PRN
  Administered 2022-05-10: 2.5 mg via RESPIRATORY_TRACT

## 2022-05-10 MED ORDER — DEXAMETHASONE SODIUM PHOSPHATE 10 MG/ML IJ SOLN
6.0000 mg | INTRAMUSCULAR | Status: DC
  Administered 2022-05-11: 6 mg via INTRAVENOUS
  Filled 2022-05-10: qty 0.6

## 2022-05-10 MED ORDER — SODIUM CHLORIDE 0.9% FLUSH
3.0000 mL | Freq: Two times a day (BID) | INTRAVENOUS | Status: DC
  Administered 2022-05-10 – 2022-05-13 (×8): 3 mL via INTRAVENOUS

## 2022-05-10 MED ORDER — IPRATROPIUM BROMIDE 0.02 % IN SOLN
0.5000 mg | Freq: Four times a day (QID) | RESPIRATORY_TRACT | Status: DC
  Administered 2022-05-10 – 2022-05-11 (×4): 0.5 mg via RESPIRATORY_TRACT
  Filled 2022-05-10 (×6): qty 2.5

## 2022-05-10 MED ORDER — IPRATROPIUM-ALBUTEROL 0.5-2.5 (3) MG/3ML IN SOLN
3.0000 mL | Freq: Once | RESPIRATORY_TRACT | Status: AC
  Administered 2022-05-10: 3 mL via RESPIRATORY_TRACT
  Filled 2022-05-10: qty 3

## 2022-05-10 NOTE — H&P (Addendum)
History and Physical   [name] [name] NLZ:[id number] DOB: 1960/09/21 DOA: 05/10/2022  PCP: Charlott Rakes, MD   Patient coming from: Home  Chief Complaint: Shortness of breath  HPI: [name] [name] is a 62 y.o. male with medical history significant of COPD, pulmonary hypertension, chronic respiratory failure with hypoxia, obesity, hypertension, hyperlipidemia, gout, CKD 3 presenting with worsening shortness of breath.  Patient reports worsening shortness of breath starting yesterday.  Has had gradual worsening in his shortness of breath since that time.  Has tried home breathing treatments without improvement.  Reports he uses 4 to 6 L home oxygen at baseline and has had to increase this to at least 6+ L and in the ED has required 6 to 8 L.  Denies known sick contact, bur states his grandchildren could have picked it up somewhere.  Denies fevers, chills, chest pain, abdominal pain, constipation, diarrhea, nausea, vomiting.  ED Course: Vital signs in the ED significant for blood pressure in the 90W to 409B systolic, heart rate in the 90s to 110s, respiratory rate in the teens to 20s, requiring 60 L to maintain saturations.  Lab workup included CMP with chloride 93, bicarb near baseline 34, albumin 3.4, T. bili 1.3.  CBC with stable hemoglobin at 12.8.  BNP normal.  Respiratory panel for flu COVID and RSV positive for COVID.  Chest x-ray showing bibasilar airspace opacities possibly representing atelectasis versus scarring versus pneumonia, emphysema noted.  Patient received Solu-Medrol, albuterol, DuoNeb in the ED.  Review of Systems: As per HPI otherwise all other systems reviewed and are negative.  Past Medical History:  Diagnosis Date   Asthma 04/10/1997   CKD (chronic kidney disease), 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   COPD (chronic obstructive pulmonary disease) (HCC)    on 4L home O2   Essential hypertension    Gout    Thrombocytopenia ([place]) 07/28/2012    Past Surgical History:  Procedure  Laterality Date   FRACTURE SURGERY  childhood   right arm   KNEE SURGERY     right knee s/p trauma    Social History  reports that he quit smoking about 3 years ago. His smoking use included cigarettes. He has a 45.00 pack-year smoking history. He has never used smokeless tobacco. He reports current alcohol use. He reports that he does not use drugs.  Allergies  Allergen Reactions   Atorvastatin Itching and Swelling    Facial, tongue swelling   Shellfish Allergy Anaphylaxis   Colchicine Hives    Family History  Problem Relation Age of Onset   Cancer Mother        colon    Heart disease Father    Colon cancer Other        mother ? age of diagnosis    Healthy Son    Healthy Daughter   Reviewed on admission  Prior to Admission medications   Medication Sig Start Date End Date Taking? Authorizing Provider  albuterol (PROVENTIL) (2.5 MG/3ML) 0.083% nebulizer solution TAKE 3 MLS (2.5 MG TOTAL) BY NEBULIZATION EVERY 6 (SIX) HOURS AS NEEDED FOR SHORTNESS OF BREATH. 03/27/22 03/27/23  Charlott Rakes, MD  albuterol (VENTOLIN HFA) 108 (90 Base) MCG/ACT inhaler inhale 2 pufss every 6 hours as needed for wheezing or shortness of breath 12/05/21   Mannam, Praveen, MD  allopurinol (ZYLOPRIM) 100 MG tablet Take 1 tablet (100 mg total) by mouth daily. 03/14/22   Charlott Rakes, MD  amLODipine (NORVASC) 10 MG tablet Take 1 tablet (10 mg total) by mouth daily. 03/14/22  Charlott Rakes, MD  ammonium lactate (LAC-HYDRIN) 12 % lotion Apply 1 application topically daily as needed (psoriasis). 08/28/18   [provider]  Apremilast (OTEZLA) 30 MG TABS Take 30 mg by mouth 2 (two) times daily.    [provider]  Ascorbic Acid (VITAMIN C) 1000 MG tablet Take 1,000 mg by mouth daily.    [provider]  budesonide-formoterol (SYMBICORT) 160-4.5 MCG/ACT inhaler INHALE 2 PUFFS INTO THE LUNGS TWICE DAILY. 12/05/21 12/05/22  [name], MD  cyclobenzaprine (FLEXERIL) 5 MG tablet  Take 1 tablet (5 mg total) by mouth 2 (two) times daily as needed for muscle spasms. 10/30/18   Tegeler, Gwenyth Allegra, MD  diphenhydrAMINE (BENADRYL) 25 MG tablet Take 50 mg by mouth daily as needed (allergies).     [provider]  ezetimibe (ZETIA) 10 MG tablet Take 1 tablet (10 mg total) by mouth daily. 03/15/22   Charlott Rakes, MD  gabapentin (NEURONTIN) 300 MG capsule TAKE 1 CAPSULE (300 MG TOTAL) BY MOUTH AT BEDTIME. Patient taking differently: Take 300 mg by mouth at bedtime as needed (pain). 08/06/20 08/06/21  Charlott Rakes, MD  guaiFENesin-dextromethorphan (ROBITUSSIN DM) 100-10 MG/5ML syrup Take 10 mLs by mouth every 4 (four) hours as needed for cough. 12/14/20   Pokhrel, Corrie Mckusick, MD  hydrocortisone 1 % ointment Apply 1 application topically 2 (two) times daily. 12/30/18   Charlott Rakes, MD  ibuprofen (ADVIL) 200 MG tablet Take 800 mg by mouth daily as needed (gout swelling).    [provider]  losartan (COZAAR) 50 MG tablet Take 1 tablet (50 mg total) by mouth daily. 03/14/22   Charlott Rakes, MD  metoprolol succinate (TOPROL-XL) 50 MG 24 hr tablet Take 1 tablet (50 mg total) by mouth daily. 03/14/22   Charlott Rakes, MD  tiotropium (SPIRIVA HANDIHALER) 18 MCG inhalation capsule PLACE 1 CAPSULE (18 MCG TOTAL) INTO INHALER AND INHALE DAILY. 11/22/21 11/22/22  Mannam, Hart Robinsons, MD  triamcinolone ointment (KENALOG) 0.1 % APPLY 1 APPLICATION TOPICALLY 2 TIMES DAILY. 04/12/17   Charlott Rakes, MD  vitamin B-12 (CYANOCOBALAMIN) 500 MCG tablet Take 500 mcg by mouth daily.    [provider]  ALBUTEROL IN Inhale into the lungs.  07/03/11  [provider]    Physical Exam: Vitals:   05/10/22 1138 05/10/22 1140 05/10/22 1200 05/10/22 1220  BP:  139/83 128/85 136/86  Pulse:  97 91 93  Resp:  19 (!) 21 (!) 21  Temp:      TempSrc:      SpO2: 98% 100% 100% 97%  Weight:      Height:        Physical Exam Constitutional:      General: He is not in acute  distress.    Appearance: Normal appearance.  HENT:     Head: Normocephalic and atraumatic.     Mouth/Throat:     Mouth: Mucous membranes are moist.     Pharynx: Oropharynx is clear.  Eyes:     Extraocular Movements: Extraocular movements intact.     Pupils: Pupils are equal, round, and reactive to light.  Cardiovascular:     Rate and Rhythm: Normal rate and regular rhythm.     Pulses: Normal pulses.     Heart sounds: Normal heart sounds.  Pulmonary:     Effort: Pulmonary effort is normal. No respiratory distress.     Breath sounds: Wheezing present.  Abdominal:     General: Bowel sounds are normal. There is no distension.  Palpations: Abdomen is soft.     Tenderness: There is no abdominal tenderness.  Musculoskeletal:        General: No swelling or deformity.  Skin:    General: Skin is warm and dry.  Neurological:     General: No focal deficit present.     Mental Status: Mental status is at baseline.    Labs on Admission: I have personally reviewed following labs and imaging studies  CBC: Recent Labs  Lab 05/10/22 1044  WBC 10.2  NEUTROABS 7.5  HGB 12.8*  HCT 43.7  MCV 82.3  PLT 734    Basic Metabolic Panel: Recent Labs  Lab 05/10/22 1202  NA 135  K 4.9  CL 93*  CO2 34*  GLUCOSE 100*  BUN 8  CREATININE 1.11  CALCIUM 9.3    GFR: Estimated Creatinine Clearance: 75.3 mL/min (by C-G formula based on SCr of 1.11 mg/dL).  Liver Function Tests: Recent Labs  Lab 05/10/22 1202  AST 21  ALT 14  ALKPHOS 72  BILITOT 1.3*  PROT 7.4  ALBUMIN 3.4*    Urine analysis:    Component Value Date/Time   COLORURINE YELLOW 10/30/2018 0730   APPEARANCEUR CLEAR 10/30/2018 0730   LABSPEC 1.013 10/30/2018 0730   PHURINE 6.0 10/30/2018 0730   GLUCOSEU NEGATIVE 10/30/2018 0730   HGBUR NEGATIVE 10/30/2018 0730   BILIRUBINUR NEGATIVE 10/30/2018 0730   KETONESUR NEGATIVE 10/30/2018 0730   PROTEINUR 30 (A) 10/30/2018 0730   UROBILINOGEN 0.2 12/11/2013 0951    NITRITE NEGATIVE 10/30/2018 0730   LEUKOCYTESUR NEGATIVE 10/30/2018 0730    Radiological Exams on Admission: DG Chest Port 1 View  Result Date: 05/10/2022 CLINICAL DATA:  Shortness of breath. EXAM: PORTABLE CHEST 1 VIEW COMPARISON:  11/08/2021 and CT chest 10/30/2018. FINDINGS: Trachea is midline. Heart size stable. Lungs are emphysematous. Mild bibasilar ill-defined airspace opacification. No definite pleural fluid. IMPRESSION: 1. Bibasilar airspace opacification may be due to atelectasis superimposed on scarring. Difficult to exclude pneumonia. 2.  Emphysema (ICD10-J43.9). Electronically Signed   By: Lorin Picket M.D.   On: 05/10/2022 10:36    EKG: Independently reviewed.  Sinus tachycardia at 101 bpm.  Baseline artifact noted.  Low voltage multiple leads.  Assessment/Plan Principal Problem:   Acute on chronic respiratory failure with hypoxia (HCC) Active Problems:   CKD (chronic kidney disease) stage 2, GFR 60-89 ml/min   Gout   COPD with acute exacerbation (HCC)   Hypertension   Moderate to severe pulmonary hypertension (HCC)   Class 2 obesity   Hyperlipidemia   COVID-19 virus infection   Acute on chronic respiratory failure with hypoxia COVID-19 infection, ?Developing pneumonia COPD exacerbation Pulmonary hypertension > Reports about 1 day of worsening shortness of breath not responding to home breathing treatments. > Has had to increase home oxygen from baseline of 4-6 now at 6 to 8 L. > Possible opacities on chest x-ray that could be consistent with developing COVID-pneumonia.  No leukocytosis. > Received Solu-Medrol, DuoNeb, albuterol in the ED - Start IV Decadron tomorrow - Given increased oxygen requirement to 8 L from baseline of 4-6, and history of previously requiring/receiving Actemra during admission in 2022 for the same, will proceed with Actemra, discussed with patient. - Pulmonary hygiene - As needed cough suppressant - Zinc, vitamin C - Scheduled  ipratropium, as needed albuterol - Replace home Symbicort formulary Dulera as needed inhaler - Daily CBC, CMP, CRP, ferritin, mag, Phos  Hypertension - Continue home amlodipine, losartan, metoprolol  Hyperlipidemia Statin intolerance - Continue  home Zetia  Gout - Continue home allopurinol  CKD 3A > Creatinine stable in the ED and appears to be CKD 2 with calculated creatinine clearance of 75. - Continue to trend renal function and electrolytes  Obesity - Noted   DVT prophylaxis: Lovenox Code Status:   Full,Discussed at bedside Family Communication:  None on admission, patient state family is aware of admission  Disposition Plan:   Patient is from:  Home  Anticipated DC to:  Home  Anticipated DC date:  2 to 5 days  Anticipated DC barriers: None  Consults called:  None Admission status:  Inpatient, progressive  Severity of Illness: The appropriate patient status for this patient is INPATIENT. Inpatient status is judged to be reasonable and necessary in order to provide the required intensity of service to ensure the patient's safety. The patient's presenting symptoms, physical exam findings, and initial radiographic and laboratory data in the context of their chronic comorbidities is felt to place them at high risk for further clinical deterioration. Furthermore, it is not anticipated that the patient will be medically stable for discharge from the hospital within 2 midnights of admission.   * I certify that at the point of admission it is my clinical judgment that the patient will require inpatient hospital care spanning beyond 2 midnights from the point of admission due to high intensity of service, high risk for further deterioration and high frequency of surveillance required.Marcelyn Bruins MD [facility]  How to contact the [facility] Attending or Consulting provider [place] or covering provider during after hours [place], for this patient?   Check the care team  in [facility] and look for a) attending/consulting [facility] provider listed and b) the [facility] team listed Log into [email or web address] and use [place]'s universal password to access. If you do not have the password, please contact the hospital operator. Locate the [facility] provider you are looking for under [facility] and page to a number that you can be directly reached. If you still have difficulty reaching the provider, please page the [facility] (Director on Call) for the Hospitalists listed on amion for assistance.  05/10/2022, 2:43 PM

## 2022-05-10 NOTE — ED Provider Notes (Signed)
[facility] Provider Note   CSN: [id number] Arrival date & time: 05/10/22  8676     History  Chief Complaint  Patient presents with   Shortness of Breath    [name] [name] is a 62 y.o. male.  HPI Patient multiple medical issues including COPD, home oxygen dependency presents with dyspnea, fatigue.  Onset was yesterday.  Patient is a former smoker, previously used cocaine.  He notes that since yesterday has had increasing dyspnea and spite using multiple breathing treatments, and increasing his home oxygen.  He notes a particular with exertion he is dyspneic.  No actual chest pain, no fever.  Patient had [place] 2 years ago.    Home Medications Prior to Admission medications   Medication Sig Start Date End Date Taking? Authorizing Provider  albuterol (PROVENTIL) (2.5 MG/3ML) 0.083% nebulizer solution TAKE 3 MLS (2.5 MG TOTAL) BY NEBULIZATION EVERY 6 (SIX) HOURS AS NEEDED FOR SHORTNESS OF BREATH. 03/27/22 03/27/23  Charlott Rakes, MD  albuterol (VENTOLIN HFA) 108 (90 Base) MCG/ACT inhaler inhale 2 pufss every 6 hours as needed for wheezing or shortness of breath 12/05/21   Mannam, Praveen, MD  allopurinol (ZYLOPRIM) 100 MG tablet Take 1 tablet (100 mg total) by mouth daily. 03/14/22   Charlott Rakes, MD  amLODipine (NORVASC) 10 MG tablet Take 1 tablet (10 mg total) by mouth daily. 03/14/22   Charlott Rakes, MD  ammonium lactate (LAC-HYDRIN) 12 % lotion Apply 1 application topically daily as needed (psoriasis). 08/28/18   [provider]  Apremilast (OTEZLA) 30 MG TABS Take 30 mg by mouth 2 (two) times daily.    [provider]  Ascorbic Acid (VITAMIN C) 1000 MG tablet Take 1,000 mg by mouth daily.    [provider]  budesonide-formoterol (SYMBICORT) 160-4.5 MCG/ACT inhaler INHALE 2 PUFFS INTO THE LUNGS TWICE DAILY. 12/05/21 12/05/22  [name], MD  cyclobenzaprine (FLEXERIL) 5 MG tablet Take 1 tablet (5 mg total) by  mouth 2 (two) times daily as needed for muscle spasms. 10/30/18   Tegeler, Gwenyth Allegra, MD  diphenhydrAMINE (BENADRYL) 25 MG tablet Take 50 mg by mouth daily as needed (allergies).     [provider]  ezetimibe (ZETIA) 10 MG tablet Take 1 tablet (10 mg total) by mouth daily. 03/15/22   Charlott Rakes, MD  gabapentin (NEURONTIN) 300 MG capsule TAKE 1 CAPSULE (300 MG TOTAL) BY MOUTH AT BEDTIME. Patient taking differently: Take 300 mg by mouth at bedtime as needed (pain). 08/06/20 08/06/21  Charlott Rakes, MD  guaiFENesin-dextromethorphan (ROBITUSSIN DM) 100-10 MG/5ML syrup Take 10 mLs by mouth every 4 (four) hours as needed for cough. 12/14/20   Pokhrel, Corrie Mckusick, MD  hydrocortisone 1 % ointment Apply 1 application topically 2 (two) times daily. 12/30/18   Charlott Rakes, MD  ibuprofen (ADVIL) 200 MG tablet Take 800 mg by mouth daily as needed (gout swelling).    [provider]  losartan (COZAAR) 50 MG tablet Take 1 tablet (50 mg total) by mouth daily. 03/14/22   Charlott Rakes, MD  metoprolol succinate (TOPROL-XL) 50 MG 24 hr tablet Take 1 tablet (50 mg total) by mouth daily. 03/14/22   Charlott Rakes, MD  molnupiravir EUA 200 mg CAPS Take 4 capsules by mouth 2 (two) times daily.    [provider]  predniSONE (DELTASONE) 20 MG tablet Take 1 tablet (20 mg total) by mouth daily. 11/30/20   Raspet, Erin K, PA-C  tiotropium (SPIRIVA HANDIHALER) 18 MCG inhalation capsule PLACE 1  CAPSULE (18 MCG TOTAL) INTO INHALER AND INHALE DAILY. 11/22/21 11/22/22  Mannam, Hart Robinsons, MD  triamcinolone ointment (KENALOG) 0.1 % APPLY 1 APPLICATION TOPICALLY 2 TIMES DAILY. 04/12/17   Charlott Rakes, MD  vitamin B-12 (CYANOCOBALAMIN) 500 MCG tablet Take 500 mcg by mouth daily.    [provider]  ALBUTEROL IN Inhale into the lungs.  07/03/11  [provider]      Allergies    Atorvastatin, Shellfish allergy, and Colchicine    Review of Systems   Review of Systems  All other systems  reviewed and are negative.   Physical Exam Updated Vital Signs BP 136/86   Pulse 93   Temp 98.5 F (36.9 C) (Oral)   Resp (!) 21   Ht '5\' 6"'$  (1.676 m)   Wt 99.8 kg   SpO2 97%   BMI 35.51 kg/m  Physical Exam Vitals and nursing note reviewed.  Constitutional:      General: He is not in acute distress.    Appearance: He is well-developed.  HENT:     Head: Normocephalic and atraumatic.  Eyes:     Conjunctiva/sclera: Conjunctivae normal.  Cardiovascular:     Rate and Rhythm: Regular rhythm. Tachycardia present.  Pulmonary:     Effort: Tachypnea and accessory muscle usage present.     Breath sounds: Decreased breath sounds and wheezing present.  Abdominal:     General: There is no distension.  Skin:    General: Skin is warm and dry.  Neurological:     Mental Status: He is alert and oriented to person, place, and time.     ED Results / Procedures / Treatments   Labs (all labs ordered are listed, but only abnormal results are displayed) Labs Reviewed  RESP PANEL BY RT-PCR (RSV, FLU A&B, COVID)  [id number] - Abnormal; Notable for the following components:      Result Value   SARS Coronavirus 2 by RT PCR POSITIVE (*)    All other components within normal limits  CBC WITH DIFFERENTIAL/PLATELET - Abnormal; Notable for the following components:   Hemoglobin 12.8 (*)    MCH 24.1 (*)    MCHC 29.3 (*)    Monocytes Absolute 1.8 (*)    All other components within normal limits  COMPREHENSIVE METABOLIC PANEL - Abnormal; Notable for the following components:   Chloride 93 (*)    CO2 34 (*)    Glucose, Bld 100 (*)    Albumin 3.4 (*)    Total Bilirubin 1.3 (*)    All other components within normal limits  BRAIN NATRIURETIC PEPTIDE    EKG EKG Interpretation  Date/Time:  Wednesday May 10 2022 09:05:25 EST Ventricular Rate:  101 PR Interval:  174 QRS Duration: 98 QT Interval:  353 QTC Calculation: 458 R Axis:   78 Text Interpretation: Sinus tachycardia Borderline T wave  abnormalities Artifact in lead(s) I III aVL V1 V2 Abnormal ECG Confirmed by Carmin Muskrat [phone]) on 05/10/2022 10:00:40 AM  Radiology DG Chest Port 1 View  Result Date: 05/10/2022 CLINICAL DATA:  Shortness of breath. EXAM: PORTABLE CHEST 1 VIEW COMPARISON:  11/08/2021 and CT chest 10/30/2018. FINDINGS: Trachea is midline. Heart size stable. Lungs are emphysematous. Mild bibasilar ill-defined airspace opacification. No definite pleural fluid. IMPRESSION: 1. Bibasilar airspace opacification may be due to atelectasis superimposed on scarring. Difficult to exclude pneumonia. 2.  Emphysema (ICD10-J43.9). Electronically Signed   By: Lorin Picket M.D.   On: 05/10/2022 10:36    Procedures Procedures    Medications  Ordered in ED Medications  albuterol (PROVENTIL,VENTOLIN) solution continuous neb (10 mg/hr Nebulization Not Given 05/10/22 1137)  ipratropium-albuterol (DUONEB) 0.5-2.5 (3) MG/3ML nebulizer solution 3 mL (has no administration in time range)  methylPREDNISolone sodium succinate (SOLU-MEDROL) 125 mg/2 mL injection 125 mg (125 mg Intravenous Given 05/10/22 1101)  albuterol (PROVENTIL) (2.5 MG/3ML) 0.083% nebulizer solution (10 mg  Given 05/10/22 1137)    ED Course/ Medical Decision Making/ A&P                             Medical Decision Making Patient with history of tobacco abuse in the past, cocaine abuse in the past, COPD with home oxygen dependency presents with dyspnea, fatigue, concern for pneumonia, bacteremia, sepsis, viral syndrome, less likely ACS though this is a consideration. After arrival the patient received continuous albuterol for 1 hour, was placed on monitoring,  Oxygen saturation 97% with 6 L via nasal cannula abnormal Cardiac 95 sinus normal   Amount and/or Complexity of Data Reviewed External Data Reviewed: notes. Labs: ordered. Decision-making details documented in ED Course. Radiology: ordered and independent interpretation performed. Decision-making  details documented in ED Course. ECG/medicine tests: ordered and independent interpretation performed. Decision-making details documented in ED Course.  Risk Prescription drug management.   1:26 PM Patient remains in similar condition, sitting upright, hunched over his desk.  He has received continuous treatment, feels somewhat better, though he continues to have diminished breath sounds bilaterally. I reviewed findings, x-ray, labs with him.  Findings are concerning for COVID-pneumonia, and given his substantial risk profile with COPD, home oxygen dependency, and new increased work of breathing, patient has started steroids, bronchodilators, will require admission with ongoing therapy.         Final Clinical Impression(s) / ED Diagnoses Final diagnoses:  Pneumonia due to COVID-19 virus     Carmin Muskrat, MD 05/10/22 1327

## 2022-05-10 NOTE — ED Triage Notes (Addendum)
Pt BIB [facility] form home d/t waking up & pt stating his COPD worsened & his O2 with exertion was in the 60's & he was SOB. EMS arrived & stated pt showed increased lethargy with ambulation & his O2 was 97% on 6L, 110 bpm, 150/67, CBG 193, A/Ox4.

## 2022-05-10 NOTE — Progress Notes (Signed)
RT removed 6L [place] and place pt on '10mg'$  Albuterol CAT. Pt tolerating well at this time. RT will monitor as needed.

## 2022-05-11 ENCOUNTER — Inpatient Hospital Stay (HOSPITAL_COMMUNITY): Payer: Medicaid Other

## 2022-05-11 DIAGNOSIS — R0603 Acute respiratory distress: Secondary | ICD-10-CM | POA: Diagnosis not present

## 2022-05-11 DIAGNOSIS — J9621 Acute and chronic respiratory failure with hypoxia: Secondary | ICD-10-CM | POA: Diagnosis not present

## 2022-05-11 DIAGNOSIS — I10 Essential (primary) hypertension: Secondary | ICD-10-CM | POA: Diagnosis not present

## 2022-05-11 DIAGNOSIS — J441 Chronic obstructive pulmonary disease with (acute) exacerbation: Secondary | ICD-10-CM | POA: Diagnosis not present

## 2022-05-11 DIAGNOSIS — U071 COVID-19: Secondary | ICD-10-CM | POA: Diagnosis not present

## 2022-05-11 LAB — COMPREHENSIVE METABOLIC PANEL
ALT: 12 U/L (ref 0–44)
AST: 23 U/L (ref 15–41)
Albumin: 3.2 g/dL — ABNORMAL LOW (ref 3.5–5.0)
Alkaline Phosphatase: 66 U/L (ref 38–126)
Anion gap: 10 (ref 5–15)
BUN: 12 mg/dL (ref 8–23)
CO2: 33 mmol/L — ABNORMAL HIGH (ref 22–32)
Calcium: 9.3 mg/dL (ref 8.9–10.3)
Chloride: 94 mmol/L — ABNORMAL LOW (ref 98–111)
Creatinine, Ser: 1.12 mg/dL (ref 0.61–1.24)
GFR, Estimated: >60 mL/min (ref >60)
Glucose, Bld: 104 mg/dL — ABNORMAL HIGH (ref 70–99)
Potassium: 3.8 mmol/L (ref 3.5–5.1)
Sodium: 137 mmol/L (ref 135–145)
Total Bilirubin: 1 mg/dL (ref 0.3–1.2)
Total Protein: 7.3 g/dL (ref 6.5–8.1)

## 2022-05-11 LAB — CBC WITH DIFFERENTIAL/PLATELET
Abs Immature Granulocytes: 0.04 10*3/uL (ref 0.00–0.07)
Basophils Absolute: 0 10*3/uL (ref 0.0–0.1)
Basophils Relative: 0 %
Eosinophils Absolute: 0 10*3/uL (ref 0.0–0.5)
Eosinophils Relative: 0 %
HCT: 39.7 % (ref 39.0–52.0)
Hemoglobin: 12.4 g/dL — ABNORMAL LOW (ref 13.0–17.0)
Immature Granulocytes: 0 %
Lymphocytes Relative: 13 %
Lymphs Abs: 1.3 10*3/uL (ref 0.7–4.0)
MCH: 24.5 pg — ABNORMAL LOW (ref 26.0–34.0)
MCHC: 31.2 g/dL (ref 30.0–36.0)
MCV: 78.5 fL — ABNORMAL LOW (ref 80.0–100.0)
Monocytes Absolute: 1.2 10*3/uL — ABNORMAL HIGH (ref 0.1–1.0)
Monocytes Relative: 12 %
Neutro Abs: 7.3 10*3/uL (ref 1.7–7.7)
Neutrophils Relative %: 75 %
Platelets: 212 10*3/uL (ref 150–400)
RBC: 5.06 MIL/uL (ref 4.22–5.81)
RDW: 12.5 % (ref 11.5–15.5)
WBC: 9.9 10*3/uL (ref 4.0–10.5)
nRBC: 0 % (ref 0.0–0.2)

## 2022-05-11 LAB — ECHOCARDIOGRAM COMPLETE
AV Mean grad: 4 mmHg
AV Peak grad: 8.3 mmHg
Ao pk vel: 1.44 m/s
Height: 66 in
Weight: 3412.8 [oz_av]

## 2022-05-11 LAB — FERRITIN: Ferritin: 133 ng/mL (ref 24–336)

## 2022-05-11 LAB — MAGNESIUM: Magnesium: 1.5 mg/dL — ABNORMAL LOW (ref 1.7–2.4)

## 2022-05-11 LAB — PHOSPHORUS: Phosphorus: 2.9 mg/dL (ref 2.5–4.6)

## 2022-05-11 LAB — HIV ANTIBODY (ROUTINE TESTING W REFLEX): HIV Screen 4th Generation wRfx: NONREACTIVE

## 2022-05-11 LAB — C-REACTIVE PROTEIN: CRP: 2.7 mg/dL — ABNORMAL HIGH (ref <1.0)

## 2022-05-11 MED ORDER — MAGNESIUM SULFATE 4 GM/100ML IV SOLN
4.0000 g | Freq: Once | INTRAVENOUS | Status: AC
  Administered 2022-05-11: 4 g via INTRAVENOUS
  Filled 2022-05-11: qty 100

## 2022-05-11 MED ORDER — ALBUTEROL SULFATE (2.5 MG/3ML) 0.083% IN NEBU: 2.5000 mg | INHALATION_SOLUTION | Freq: Four times a day (QID) | RESPIRATORY_TRACT | Status: DC | PRN

## 2022-05-11 MED ORDER — ARFORMOTEROL TARTRATE 15 MCG/2ML IN NEBU
15.0000 ug | INHALATION_SOLUTION | Freq: Two times a day (BID) | RESPIRATORY_TRACT | Status: DC
  Administered 2022-05-11 – 2022-05-14 (×7): 15 ug via RESPIRATORY_TRACT
  Filled 2022-05-11 (×7): qty 2

## 2022-05-11 MED ORDER — METHYLPREDNISOLONE SODIUM SUCC 125 MG IJ SOLR
60.0000 mg | Freq: Two times a day (BID) | INTRAMUSCULAR | Status: DC
  Administered 2022-05-11 – 2022-05-13 (×5): 60 mg via INTRAVENOUS
  Filled 2022-05-11 (×5): qty 2

## 2022-05-11 MED ORDER — IPRATROPIUM-ALBUTEROL 0.5-2.5 (3) MG/3ML IN SOLN: 3.0000 mL | Freq: Three times a day (TID) | RESPIRATORY_TRACT | Status: DC

## 2022-05-11 MED ORDER — IPRATROPIUM-ALBUTEROL 0.5-2.5 (3) MG/3ML IN SOLN
3.0000 mL | Freq: Two times a day (BID) | RESPIRATORY_TRACT | Status: DC
  Administered 2022-05-11 – 2022-05-14 (×6): 3 mL via RESPIRATORY_TRACT
  Filled 2022-05-11 (×6): qty 3

## 2022-05-11 MED ORDER — BUDESONIDE 0.25 MG/2ML IN SUSP
0.2500 mg | Freq: Two times a day (BID) | RESPIRATORY_TRACT | Status: DC
  Administered 2022-05-11 – 2022-05-14 (×7): 0.25 mg via RESPIRATORY_TRACT
  Filled 2022-05-11 (×7): qty 2

## 2022-05-11 NOTE — ED Notes (Signed)
Pt O2 at 90% on 6L on call notified

## 2022-05-11 NOTE — ED Notes (Signed)
ED TO INPATIENT HANDOFF REPORT  ED Nurse Name and Phone #: [phone]  S Name/Age/Gender [name] [name] 62 y.o. male Room/Bed: 033C/033C  Code Status   Code Status: Full Code  Home/SNF/Other Home Patient oriented to: self, place, time, and situation Is this baseline? Yes   Triage Complete: Triage complete  Chief Complaint Acute on chronic respiratory failure with hypoxia ([place]) [J96.21]  Triage Note Pt BIB [facility] form home d/t waking up & pt stating his COPD worsened & his O2 with exertion was in the 60's & he was SOB. EMS arrived & stated pt showed increased lethargy with ambulation & his O2 was 97% on 6L, 110 bpm, 150/67, CBG 193, A/Ox4.    Allergies Allergies  Allergen Reactions   Atorvastatin Itching and Swelling    Facial, tongue swelling   Shellfish Allergy Anaphylaxis   Colchicine Hives    Level of Care/Admitting Diagnosis ED Disposition     ED Disposition  Admit   Condition  --   Comment  Hospital Area: [facility] [[id number]]  Level of Care: Progressive [102]  Admit to Progressive based on following criteria: RESPIRATORY PROBLEMS hypoxemic/hypercapnic respiratory failure that is responsive to NIPPV (BiPAP) or High Flow Nasal Cannula (6-80 lpm). Frequent assessment/intervention, no > Q2 hrs < Q4 hrs, to maintain oxygenation and pulmonary hygiene.  May admit patient to [facility] or [facility] if equivalent level of care is available:: No  Covid Evaluation: Confirmed COVID Positive  Diagnosis: Acute on chronic respiratory failure with hypoxia [facility]) [[id number]]  Admitting Physician: Marcelyn Bruins [[id number]]  Attending Physician: Marcelyn Bruins [[id number]]  Certification:: I certify this patient will need inpatient services for at least 2 midnights  Estimated Length of Stay: 3          B Medical/Surgery History Past Medical History:  Diagnosis Date   Asthma 04/10/1997   CKD (chronic kidney disease), 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   COPD (chronic  obstructive pulmonary disease) ([place])    on 4L home O2   Essential hypertension    Gout    Thrombocytopenia ([place]) 07/28/2012   Past Surgical History:  Procedure Laterality Date   FRACTURE SURGERY  childhood   right arm   KNEE SURGERY     right knee s/p trauma     A IV Location/Drains/Wounds Patient Lines/Drains/Airways Status     Active Line/Drains/Airways     Name Placement date Placement time Site Days   Peripheral IV 05/10/22 20 G Left Antecubital 05/10/22  1053  Antecubital  1            Intake/Output Last 24 hours  Intake/Output Summary (Last 24 hours) at 05/11/2022 0536 Last data filed at 05/10/2022 1720 Gross per 24 hour  Intake --  Output 700 ml  Net -700 ml    Labs/Imaging Results for orders placed or performed during the hospital encounter of 05/10/22 (from the past 48 hour(s))  Brain natriuretic peptide     Status: None   Collection Time: 05/10/22 10:21 AM  Result Value Ref Range   B Natriuretic Peptide 31.5 0.0 - 100.0 pg/mL    Comment: Performed at [facility], [address] [place], [place] [address]  CBC with Differential/Platelet     Status: Abnormal   Collection Time: 05/10/22 10:44 AM  Result Value Ref Range   WBC 10.2 4.0 - 10.5 K/uL   RBC 5.31 4.22 - 5.81 MIL/uL   Hemoglobin 12.8 (L) 13.0 - 17.0 g/dL   HCT 43.7 39.0 -  52.0 %   MCV 82.3 80.0 - 100.0 fL   MCH 24.1 (L) 26.0 - 34.0 pg   MCHC 29.3 (L) 30.0 - 36.0 g/dL   RDW 12.6 11.5 - 15.5 %   Platelets 215 150 - 400 K/uL   nRBC 0.0 0.0 - 0.2 %   Neutrophils Relative % 73 %   Neutro Abs 7.5 1.7 - 7.7 K/uL   Lymphocytes Relative 8 %   Lymphs Abs 0.8 0.7 - 4.0 K/uL   Monocytes Relative 17 %   Monocytes Absolute 1.8 (H) 0.1 - 1.0 K/uL   Eosinophils Relative 1 %   Eosinophils Absolute 0.1 0.0 - 0.5 K/uL   Basophils Relative 1 %   Basophils Absolute 0.1 0.0 - 0.1 K/uL   Immature Granulocytes 0 %   Abs Immature Granulocytes 0.04 0.00 - 0.07 K/uL    Comment: Performed at [facility] [address], [place], [place] [address]  Resp panel by RT-PCR (RSV, Flu A&B, Covid) Anterior Nasal Swab     Status: Abnormal   Collection Time: 05/10/22 10:46 AM   Specimen: Anterior Nasal Swab  Result Value Ref Range   SARS Coronavirus 2 by RT PCR POSITIVE (A) NEGATIVE   Influenza A by PCR NEGATIVE NEGATIVE   Influenza B by PCR NEGATIVE NEGATIVE    Comment: (NOTE) The Xpert Xpress SARS-CoV-2/FLU/RSV plus assay is intended as an aid in the diagnosis of influenza from Nasopharyngeal swab specimens and should not be used as a sole basis for treatment. Nasal washings and aspirates are unacceptable for Xpert Xpress SARS-CoV-2/FLU/RSV testing.  Fact Sheet for Patients: EntrepreneurPulse.com.au  Fact Sheet for Healthcare Providers: IncredibleEmployment.be  This test is not yet approved or cleared by the [place] FDA and has been authorized for detection and/or diagnosis of SARS-CoV-2 by FDA under an Emergency Use Authorization (EUA). This EUA will remain in effect (meaning this test can be used) for the duration of the COVID-19 declaration under Section 564(b)(1) of the Act, 21 U.S.C. section 360bbb-3(b)(1), unless the authorization is terminated or revoked.     Resp Syncytial Virus by PCR NEGATIVE NEGATIVE    Comment: (NOTE) Fact Sheet for Patients: EntrepreneurPulse.com.au  Fact Sheet for Healthcare Providers: IncredibleEmployment.be  This test is not yet approved or cleared by the [place] FDA and has been authorized for detection and/or diagnosis of SARS-CoV-2 by FDA under an Emergency Use Authorization (EUA). This EUA will remain in effect (meaning this test can be used) for the duration of the COVID-19 declaration under Section 564(b)(1) of the Act, 21 U.S.C. section 360bbb-3(b)(1), unless the authorization is terminated or revoked.  Performed at [facility], [place]  [address], [place], [place] [address]   Comprehensive metabolic panel     Status: Abnormal   Collection Time: 05/10/22 12:02 PM  Result Value Ref Range   Sodium 135 135 - 145 mmol/L   Potassium 4.9 3.5 - 5.1 mmol/L    Comment: HEMOLYSIS AT THIS LEVEL MAY AFFECT RESULT   Chloride 93 (L) 98 - 111 mmol/L   CO2 34 (H) 22 - 32 mmol/L   Glucose, Bld 100 (H) 70 - 99 mg/dL    Comment: Glucose reference range applies only to samples taken after fasting for at least 8 hours.   BUN 8 8 - 23 mg/dL   Creatinine, Ser 1.11 0.61 - 1.24 mg/dL   Calcium 9.3 8.9 - 10.3 mg/dL   Total Protein 7.4 6.5 - 8.1 g/dL   Albumin 3.4 (  L) 3.5 - 5.0 g/dL   AST 21 15 - 41 U/L    Comment: HEMOLYSIS AT THIS LEVEL MAY AFFECT RESULT   ALT 14 0 - 44 U/L    Comment: HEMOLYSIS AT THIS LEVEL MAY AFFECT RESULT   Alkaline Phosphatase 72 38 - 126 U/L   Total Bilirubin 1.3 (H) 0.3 - 1.2 mg/dL    Comment: HEMOLYSIS AT THIS LEVEL MAY AFFECT RESULT   GFR, Estimated >60 >60 mL/min    Comment: (NOTE) Calculated using the CKD-EPI Creatinine Equation (2021)    Anion gap 8 5 - 15    Comment: Performed at [facility] [address] [place], [place] [address]  HIV Antibody (routine testing w rflx)     Status: None   Collection Time: 05/10/22 10:52 PM  Result Value Ref Range   HIV Screen 4th Generation wRfx Non Reactive Non Reactive    Comment: Performed at [facility], [place] [address], [place], [place] [address]  C-reactive protein     Status: Abnormal   Collection Time: 05/10/22 10:52 PM  Result Value Ref Range   CRP 3.3 (H) <1.0 mg/dL    Comment: Performed at [facility] [address], [place], [place] [address]  Ferritin     Status: None   Collection Time: 05/10/22 10:52 PM  Result Value Ref Range   Ferritin 128 24 - 336 ng/mL    Comment: Performed at [facility], [place] [address] [place], [place] [address]   DG Chest Port 1 View  Result Date: 05/10/2022 CLINICAL DATA:  Shortness of breath. EXAM:  PORTABLE CHEST 1 VIEW COMPARISON:  11/08/2021 and CT chest 10/30/2018. FINDINGS: Trachea is midline. Heart size stable. Lungs are emphysematous. Mild bibasilar ill-defined airspace opacification. No definite pleural fluid. IMPRESSION: 1. Bibasilar airspace opacification may be due to atelectasis superimposed on scarring. Difficult to exclude pneumonia. 2.  Emphysema (ICD10-J43.9). Electronically Signed   By: Lorin Picket M.D.   On: 05/10/2022 10:36    Pending Labs Unresulted Labs (From admission, onward)     Start     Ordered   05/17/22 0500  Creatinine, serum  (enoxaparin (LOVENOX)    CrCl >/= 30 ml/min)  Weekly,   R     Comments: while on enoxaparin therapy    05/10/22 1357   05/11/22 0500  Comprehensive metabolic panel  Daily,   R      05/10/22 1357   05/11/22 0500  CBC with Differential/Platelet  Daily,   R      05/10/22 1357   05/11/22 0500  C-reactive protein  Daily,   R      05/10/22 1357   05/11/22 0500  Ferritin  Daily,   R      05/10/22 1357   05/11/22 0500  Magnesium  Daily,   R      05/10/22 1357   05/11/22 0500  Phosphorus  Daily,   R      05/10/22 1357            Vitals/Pain Today's Vitals   05/10/22 2215 05/11/22 0100 05/11/22 0217 05/11/22 0500  BP: 136/82 (!) 153/77  (!) 143/101  Pulse: (!) 101 100  90  Resp: (!) 23 (!) 24  (!) 23  Temp:   98.4 F (36.9 C)   TempSrc:   Oral   SpO2: 99% 96%  (!) 86%  Weight:      Height:      PainSc:  Isolation Precautions Airborne and Contact precautions  Medications Medications  albuterol (PROVENTIL,VENTOLIN) solution continuous neb (10 mg/hr Nebulization Not Given 05/10/22 1137)  allopurinol (ZYLOPRIM) tablet 100 mg (has no administration in time range)  ezetimibe (ZETIA) tablet 10 mg (has no administration in time range)  mometasone-formoterol (DULERA) 200-5 MCG/ACT inhaler 2 puff (2 puffs Inhalation Given 05/10/22 2012)  enoxaparin (LOVENOX) injection 40 mg (40 mg Subcutaneous Given 05/10/22 1516)   sodium chloride flush (NS) 0.9 % injection 3 mL (3 mLs Intravenous Given 05/10/22 2206)  ipratropium (ATROVENT) nebulizer solution 0.5 mg (0.5 mg Inhalation Given 05/11/22 0209)  dexamethasone (DECADRON) injection 6 mg (has no administration in time range)  guaiFENesin-dextromethorphan (ROBITUSSIN DM) 100-10 MG/5ML syrup 10 mL (10 mLs Oral Given 05/10/22 2019)  chlorpheniramine-HYDROcodone (TUSSIONEX) 10-8 MG/5ML suspension 5 mL (has no administration in time range)  ascorbic acid (VITAMIN C) tablet 500 mg (500 mg Oral Given 05/10/22 1516)  zinc sulfate capsule 220 mg (220 mg Oral Given 05/10/22 1515)  acetaminophen (TYLENOL) tablet 650 mg (has no administration in time range)  polyethylene glycol (MIRALAX / GLYCOLAX) packet 17 g (has no administration in time range)  albuterol (PROVENTIL) (2.5 MG/3ML) 0.083% nebulizer solution 2.5 mg (2.5 mg Inhalation Given 05/10/22 2020)  amLODipine (NORVASC) tablet 10 mg (has no administration in time range)  metoprolol succinate (TOPROL-XL) 24 hr tablet 50 mg (has no administration in time range)  losartan (COZAAR) tablet 50 mg (has no administration in time range)  umeclidinium bromide (INCRUSE ELLIPTA) 62.5 MCG/ACT 1 puff (1 puff Inhalation Given 05/10/22 2341)  methylPREDNISolone sodium succinate (SOLU-MEDROL) 125 mg/2 mL injection 125 mg (125 mg Intravenous Given 05/10/22 1101)  ipratropium-albuterol (DUONEB) 0.5-2.5 (3) MG/3ML nebulizer solution 3 mL (3 mLs Nebulization Given 05/10/22 1608)  tocilizumab (ACTEMRA) 800 mg in sodium chloride 0.9 % 100 mL infusion (0 mg Intravenous Stopped 05/10/22 1712)    Mobility walks with device       Focused Assessments Pulmonary Assessment Handoff:  Lung sounds: Bilateral Breath Sounds: Diminished L Breath Sounds: Diminished R Breath Sounds: Diminished O2 Device: Aerosol Mask (CAT) O2 Flow Rate (L/min): 8 L/min    R Recommendations: See Admitting Provider Note  Report given to:   Additional Notes:

## 2022-05-11 NOTE — Progress Notes (Addendum)
PROGRESS NOTE        PATIENT DETAILS Name: [name] [name] Age: 62 y.o. Sex: male Date of Birth: 1960/05/09 Admit Date: 05/10/2022 Admitting Physician Marcelyn Bruins, MD ZOX:[id number], Charlane Ferretti, MD  Brief Summary: Patient is a 62 y.o.  male with history of COPD, chronic hypoxic respiratory failure on 6 L of oxygen at home-who presented with worsening shortness of breath-found to have acute on chronic hypoxic respiratory failure in setting of COVID-19 pneumonia/COPD exacerbation.  Significant events: 1/31>> admitted [facility]-worsening SOB-acute on chronic hypoxia-COPD exacerbation/COVID infection.  Significant studies: 1/31>> CXR: Bibasilar PNA  Significant microbiology data: 1/31>> COVID PCR: Positive 1/31 >>influenza/RSV PCR: Negative  Procedures: None  Consults: None  Subjective: Feels better-but still complains of exertional dyspnea (more than baseline)  Objective: Vitals: Blood pressure (!) 147/95, pulse 84, temperature 98.4 F (36.9 C), temperature source Oral, resp. rate 20, height '5\' 6"'$  (1.676 m), weight 96.8 kg, SpO2 99 %.   Exam: Gen Exam:Alert awake-not in any distress HEENT:atraumatic, normocephalic Chest: Scattered rhonchi all over. CVS:S1S2 regular Abdomen:soft non tender, non distended Extremities:no edema Neurology: Non focal Skin: no rash  Pertinent Labs/Radiology:    Latest Ref Rng & Units 05/11/2022    5:09 AM 05/10/2022   10:44 AM 12/14/2020    3:36 AM  CBC  WBC 4.0 - 10.5 K/uL 9.9  10.2  11.1   Hemoglobin 13.0 - 17.0 g/dL 12.4  12.8  13.8   Hematocrit 39.0 - 52.0 % 39.7  43.7  44.9   Platelets 150 - 400 K/uL 212  215  319     Lab Results  Component Value Date   NA 137 05/11/2022   K 3.8 05/11/2022   CL 94 (L) 05/11/2022   CO2 33 (H) 05/11/2022      Assessment/Plan: Acute on chronic hypoxic respiratory failure (on 6 L of oxygen at home) COVID-19 PNA COPD exacerbation Likely has COVID-19 PNA causing COPD  exacerbation and worsening of his hypoxemia Overall improved continue steroids were changed to Solu-Medrol, continue bronchodilators S/p Actemra on 1/31 Check echo to assess for pulmonary hypertension given severe COPD  CKD stage IIIa At baseline  HTN BP stable Continue amlodipine/losartan/metoprolol  HLD Statin intolerance Continue Zetia  Hypomagnesemia Replete/recheck  Gout Allopurinol  Obesity: Estimated body mass index is 34.43 kg/m as calculated from the following:   Height as of this encounter: '5\' 6"'$  (1.676 m).   Weight as of this encounter: 96.8 kg.   Code status:   Code Status: Full Code   DVT Prophylaxis: enoxaparin (LOVENOX) injection 40 mg Start: 05/10/22 1400   Family Communication: None at bedside   Disposition Plan: Status is: Inpatient Remains inpatient appropriate because: Severity of illness   Planned Discharge Destination:Home   Diet: Diet Order             Diet regular Room service appropriate? Yes; Fluid consistency: Thin  Diet effective now                     Antimicrobial agents: Anti-infectives (From admission, onward)    None        MEDICATIONS: Scheduled Meds:  albuterol  10 mg/hr Nebulization Once   allopurinol  100 mg Oral Daily   amLODipine  10 mg Oral Daily   vitamin C  500 mg Oral Daily   dexamethasone (DECADRON) injection  6 mg Intravenous  Q24H   enoxaparin (LOVENOX) injection  40 mg Subcutaneous Q24H   ezetimibe  10 mg Oral Daily   ipratropium  0.5 mg Inhalation Q6H   losartan  50 mg Oral Daily   metoprolol succinate  50 mg Oral Daily   mometasone-formoterol  2 puff Inhalation BID   sodium chloride flush  3 mL Intravenous Q12H   umeclidinium bromide  1 puff Inhalation Daily   zinc sulfate  220 mg Oral Daily   Continuous Infusions: PRN Meds:.acetaminophen, albuterol, chlorpheniramine-HYDROcodone, guaiFENesin-dextromethorphan, polyethylene glycol   I have personally reviewed following labs and  imaging studies  LABORATORY DATA: CBC: Recent Labs  Lab 05/10/22 1044 05/11/22 0509  WBC 10.2 9.9  NEUTROABS 7.5 7.3  HGB 12.8* 12.4*  HCT 43.7 39.7  MCV 82.3 78.5*  PLT 215 973    Basic Metabolic Panel: Recent Labs  Lab 05/10/22 1202 05/11/22 0509  NA 135 137  K 4.9 3.8  CL 93* 94*  CO2 34* 33*  GLUCOSE 100* 104*  BUN 8 12  CREATININE 1.11 1.12  CALCIUM 9.3 9.3  MG  --  1.5*  PHOS  --  2.9    GFR: Estimated Creatinine Clearance: 73.5 mL/min (by C-G formula based on SCr of 1.12 mg/dL).  Liver Function Tests: Recent Labs  Lab 05/10/22 1202 05/11/22 0509  AST 21 23  ALT 14 12  ALKPHOS 72 66  BILITOT 1.3* 1.0  PROT 7.4 7.3  ALBUMIN 3.4* 3.2*   No results for input(s): "LIPASE", "AMYLASE" in the last 168 hours. No results for input(s): "AMMONIA" in the last 168 hours.  Coagulation Profile: No results for input(s): "INR", "PROTIME" in the last 168 hours.  Cardiac Enzymes: No results for input(s): "CKTOTAL", "CKMB", "CKMBINDEX", "TROPONINI" in the last 168 hours.  BNP (last 3 results) No results for input(s): "PROBNP" in the last 8760 hours.  Lipid Profile: No results for input(s): "CHOL", "HDL", "LDLCALC", "TRIG", "CHOLHDL", "LDLDIRECT" in the last 72 hours.  Thyroid Function Tests: No results for input(s): "TSH", "T4TOTAL", "FREET4", "T3FREE", "THYROIDAB" in the last 72 hours.  Anemia Panel: Recent Labs    05/10/22 2252 05/11/22 0509  FERRITIN 128 133    Urine analysis:    Component Value Date/Time   COLORURINE YELLOW 10/30/2018 0730   APPEARANCEUR CLEAR 10/30/2018 0730   LABSPEC 1.013 10/30/2018 0730   PHURINE 6.0 10/30/2018 0730   GLUCOSEU NEGATIVE 10/30/2018 0730   HGBUR NEGATIVE 10/30/2018 0730   BILIRUBINUR NEGATIVE 10/30/2018 0730   KETONESUR NEGATIVE 10/30/2018 0730   PROTEINUR 30 (A) 10/30/2018 0730   UROBILINOGEN 0.2 12/11/2013 0951   NITRITE NEGATIVE 10/30/2018 0730   LEUKOCYTESUR NEGATIVE 10/30/2018 0730    Sepsis  Labs: Lactic Acid, Venous    Component Value Date/Time   LATICACIDVEN 0.8 10/30/2018 5329    MICROBIOLOGY: Recent Results (from the past 240 hour(s))  Resp panel by RT-PCR (RSV, Flu A&B, Covid) Anterior Nasal Swab     Status: Abnormal   Collection Time: 05/10/22 10:46 AM   Specimen: Anterior Nasal Swab  Result Value Ref Range Status   SARS Coronavirus 2 by RT PCR POSITIVE (A) NEGATIVE Final   Influenza A by PCR NEGATIVE NEGATIVE Final   Influenza B by PCR NEGATIVE NEGATIVE Final    Comment: (NOTE) The Xpert Xpress SARS-CoV-2/FLU/RSV plus assay is intended as an aid in the diagnosis of influenza from Nasopharyngeal swab specimens and should not be used as a sole basis for treatment. Nasal washings and aspirates are unacceptable for Xpert Xpress SARS-CoV-2/FLU/RSV  testing.  Fact Sheet for Patients: EntrepreneurPulse.com.au  Fact Sheet for Healthcare Providers: IncredibleEmployment.be  This test is not yet approved or cleared by the [place] FDA and has been authorized for detection and/or diagnosis of SARS-CoV-2 by FDA under an Emergency Use Authorization (EUA). This EUA will remain in effect (meaning this test can be used) for the duration of the COVID-19 declaration under Section 564(b)(1) of the Act, 21 U.S.C. section 360bbb-3(b)(1), unless the authorization is terminated or revoked.     Resp Syncytial Virus by PCR NEGATIVE NEGATIVE Final    Comment: (NOTE) Fact Sheet for Patients: EntrepreneurPulse.com.au  Fact Sheet for Healthcare Providers: IncredibleEmployment.be  This test is not yet approved or cleared by the [place] FDA and has been authorized for detection and/or diagnosis of SARS-CoV-2 by FDA under an Emergency Use Authorization (EUA). This EUA will remain in effect (meaning this test can be used) for the duration of the COVID-19 declaration under Section 564(b)(1) of the Act,  21 U.S.C. section 360bbb-3(b)(1), unless the authorization is terminated or revoked.  Performed at [facility], [place] [address], [place], [place] [address]     RADIOLOGY STUDIES/RESULTS: DG Chest Port 1 View  Result Date: 05/10/2022 CLINICAL DATA:  Shortness of breath. EXAM: PORTABLE CHEST 1 VIEW COMPARISON:  11/08/2021 and CT chest 10/30/2018. FINDINGS: Trachea is midline. Heart size stable. Lungs are emphysematous. Mild bibasilar ill-defined airspace opacification. No definite pleural fluid. IMPRESSION: 1. Bibasilar airspace opacification may be due to atelectasis superimposed on scarring. Difficult to exclude pneumonia. 2.  Emphysema (ICD10-J43.9). Electronically Signed   By: Lorin Picket M.D.   On: 05/10/2022 10:36     LOS: 1 day   Oren Binet, MD  [facility]    To contact the attending provider between 7A-7P or the covering provider during after hours 7P-7A, please log into the web site [email or web address] and access using universal [place] password for that web site. If you do not have the password, please call the hospital operator.  05/11/2022, 9:48 AM

## 2022-05-11 NOTE — Progress Notes (Signed)
Echocardiogram 2D Echocardiogram has been performed.  Ronny Flurry 05/11/2022, 12:50 PM

## 2022-05-11 NOTE — Evaluation (Signed)
Physical Therapy Evaluation Patient Details Name: [name] [name] MRN: [id number] DOB: [birth year] Today's Date: 05/11/2022  History of Present Illness  62 y.o. male admitted 1/31 for acute on chronic hypoxic respiratory failure in setting of COVID-19 pneumonia/COPD exacerbation. PMHx: COPD, chronic hypoxic respiratory failure on 6 L of oxygen at home-who presented with worsening shortness of breath-found to have  Clinical Impression  Pt admitted with above diagnosis. Resting sats in supine and seated EOB 94% on 8L supplemental HHFNC. Ambulates short distances in room before fatiguing with SpO2 quickly dropping to 84% (Still on 8L); sats rapidly improve <1 min with seated rest. Pt reports excellent family support at home. Pt currently with functional limitations due to the deficits listed below (see PT Problem List). Pt will benefit from skilled PT to increase their independence and safety with mobility to allow discharge to the venue listed below.          Recommendations for follow up therapy are one component of a multi-disciplinary discharge planning process, led by the attending physician.  Recommendations may be updated based on patient status, additional functional criteria and insurance authorization.  Follow Up Recommendations  (Pending progress- likely OPPT)      Assistance Recommended at Discharge Intermittent Supervision/Assistance  Patient can return home with the following  A little help with walking and/or transfers;A little help with bathing/dressing/bathroom;Assist for transportation    Equipment Recommendations Other (comment) (Pending - possibly RW)  Recommendations for Other Services       Functional Status Assessment Patient has had a recent decline in their functional status and demonstrates the ability to make significant improvements in function in a reasonable and predictable amount of time.     Precautions / Restrictions Precautions Precautions: None Precaution  Comments: Monitor O2 Restrictions Weight Bearing Restrictions: No      Mobility  Bed Mobility Overal bed mobility: Modified Independent             General bed mobility comments: No assist needed    Transfers Overall transfer level: Needs assistance Equipment used: Rolling walker (2 wheels), None Transfers: Sit to/from Stand Sit to Stand: Supervision           General transfer comment: Supervision for safety, initially stood when PT entered room but holding onto furniture. Educated on safety and use of RW for support to stand. Good stability once upright with RW. No physical assist needed.    Ambulation/Gait Ambulation/Gait assistance: Supervision Gait Distance (Feet): 20 Feet Assistive device: Rolling walker (2 wheels) Gait Pattern/deviations: Step-through pattern, Decreased step length - right, Decreased step length - left, Trunk flexed Gait velocity: decr Gait velocity interpretation: <1.8 ft/sec, indicate of risk for recurrent falls   General Gait Details: Educated on appropriate AD use with RW. Cues for upright posture prior to walking, pt leaning on elbows. Sats down to 84 from 91% on 8L supplemental O2. No buckling noted, mild balance deficits but controls RW reasonably well. Very fatigued with one trip to the door and back to the bed.  Stairs            Wheelchair Mobility    Modified Rankin (Stroke Patients Only)       Balance Overall balance assessment: Mild deficits observed, not formally tested                                           Pertinent Vitals/Pain Pain  Assessment Pain Assessment: No/denies pain    Home Living Family/patient expects to be discharged to:: Private residence Living Arrangements: Children;Other relatives Available Help at Discharge: Family;Available 24 hours/day Type of Home: House Home Access: Level entry       Home Layout: One level Home Equipment: None      Prior Function Prior Level of  Function : Independent/Modified Independent             Mobility Comments: Denies falls, ambulates without AD, limited by COPD       Hand Dominance        Extremity/Trunk Assessment   Upper Extremity Assessment Upper Extremity Assessment: Defer to OT evaluation    Lower Extremity Assessment Lower Extremity Assessment: Generalized weakness       Communication   Communication: No difficulties  Cognition Arousal/Alertness: Awake/alert Behavior During Therapy: WFL for tasks assessed/performed Overall Cognitive Status: Within Functional Limits for tasks assessed                                          General Comments General comments (skin integrity, edema, etc.): Rest SpO2 92-94% on 8L HHFNC lying down and sitting EOB for >5 minutes.    Exercises     Assessment/Plan    PT Assessment Patient needs continued PT services  PT Problem List Decreased strength;Decreased activity tolerance;Decreased balance;Decreased mobility;Decreased knowledge of use of DME;Cardiopulmonary status limiting activity;Obesity       PT Treatment Interventions DME instruction;Gait training;Functional mobility training;Therapeutic activities;Therapeutic exercise;Neuromuscular re-education;Balance training;Patient/family education    PT Goals (Current goals can be found in the Care Plan section)  Acute Rehab PT Goals Patient Stated Goal: Get well PT Goal Formulation: With patient Time For Goal Achievement: 05/25/22 Potential to Achieve Goals: Good    Frequency Min 3X/week     Co-evaluation               AM-PAC PT "6 Clicks" Mobility  Outcome Measure Help needed turning from your back to your side while in a flat bed without using bedrails?: None Help needed moving from lying on your back to sitting on the side of a flat bed without using bedrails?: None Help needed moving to and from a bed to a chair (including a wheelchair)?: A Little Help needed standing up  from a chair using your arms (e.g., wheelchair or bedside chair)?: A Little Help needed to walk in hospital room?: A Little Help needed climbing 3-5 steps with a railing? : A Little 6 Click Score: 20    End of Session Equipment Utilized During Treatment: Gait belt;Oxygen Activity Tolerance: Patient tolerated treatment well;Patient limited by fatigue Patient left: in bed;with call bell/phone within reach;with bed alarm set Nurse Communication: Mobility status PT Visit Diagnosis: Unsteadiness on feet (R26.81);Other abnormalities of gait and mobility (R26.89);Muscle weakness (generalized) (M62.81);Difficulty in walking, not elsewhere classified (R26.2)    Time: 2947-6546 PT Time Calculation (min) (ACUTE ONLY): 29 min   Charges:   PT Evaluation $PT Eval Low Complexity: 1 Low PT Treatments $Therapeutic Activity: 8-22 mins        Candie Mile, PT, DPT Physical Therapist [facility] [place]   Ellouise Newer 05/11/2022, 2:24 PM

## 2022-05-12 DIAGNOSIS — I10 Essential (primary) hypertension: Secondary | ICD-10-CM | POA: Diagnosis not present

## 2022-05-12 DIAGNOSIS — U071 COVID-19: Secondary | ICD-10-CM | POA: Diagnosis not present

## 2022-05-12 DIAGNOSIS — J441 Chronic obstructive pulmonary disease with (acute) exacerbation: Secondary | ICD-10-CM | POA: Diagnosis not present

## 2022-05-12 DIAGNOSIS — J9621 Acute and chronic respiratory failure with hypoxia: Secondary | ICD-10-CM | POA: Diagnosis not present

## 2022-05-12 LAB — COMPREHENSIVE METABOLIC PANEL
ALT: 15 U/L (ref 0–44)
AST: 34 U/L (ref 15–41)
Albumin: 3 g/dL — ABNORMAL LOW (ref 3.5–5.0)
Alkaline Phosphatase: 67 U/L (ref 38–126)
Anion gap: 9 (ref 5–15)
BUN: 15 mg/dL (ref 8–23)
CO2: 33 mmol/L — ABNORMAL HIGH (ref 22–32)
Calcium: 9.1 mg/dL (ref 8.9–10.3)
Chloride: 93 mmol/L — ABNORMAL LOW (ref 98–111)
Creatinine, Ser: 1.13 mg/dL (ref 0.61–1.24)
GFR, Estimated: >60 mL/min (ref >60)
Glucose, Bld: 171 mg/dL — ABNORMAL HIGH (ref 70–99)
Potassium: 3.9 mmol/L (ref 3.5–5.1)
Sodium: 135 mmol/L (ref 135–145)
Total Bilirubin: 0.7 mg/dL (ref 0.3–1.2)
Total Protein: 7 g/dL (ref 6.5–8.1)

## 2022-05-12 LAB — CBC WITH DIFFERENTIAL/PLATELET
Abs Immature Granulocytes: 0.1 10*3/uL — ABNORMAL HIGH (ref 0.00–0.07)
Basophils Absolute: 0 10*3/uL (ref 0.0–0.1)
Basophils Relative: 0 %
Eosinophils Absolute: 0 10*3/uL (ref 0.0–0.5)
Eosinophils Relative: 0 %
HCT: 41.3 % (ref 39.0–52.0)
Hemoglobin: 12.6 g/dL — ABNORMAL LOW (ref 13.0–17.0)
Immature Granulocytes: 1 %
Lymphocytes Relative: 7 %
Lymphs Abs: 1.1 10*3/uL (ref 0.7–4.0)
MCH: 24.1 pg — ABNORMAL LOW (ref 26.0–34.0)
MCHC: 30.5 g/dL (ref 30.0–36.0)
MCV: 79.1 fL — ABNORMAL LOW (ref 80.0–100.0)
Monocytes Absolute: 0.7 10*3/uL (ref 0.1–1.0)
Monocytes Relative: 4 %
Neutro Abs: 13.9 10*3/uL — ABNORMAL HIGH (ref 1.7–7.7)
Neutrophils Relative %: 88 %
Platelets: 225 10*3/uL (ref 150–400)
RBC: 5.22 MIL/uL (ref 4.22–5.81)
RDW: 12.7 % (ref 11.5–15.5)
WBC: 15.8 10*3/uL — ABNORMAL HIGH (ref 4.0–10.5)
nRBC: 0 % (ref 0.0–0.2)

## 2022-05-12 LAB — C-REACTIVE PROTEIN: CRP: 0.9 mg/dL (ref <1.0)

## 2022-05-12 LAB — MAGNESIUM: Magnesium: 2.4 mg/dL (ref 1.7–2.4)

## 2022-05-12 LAB — D-DIMER, QUANTITATIVE: D-Dimer, Quant: <0.27 ug/mL-FEU (ref 0.00–0.50)

## 2022-05-12 MED ORDER — FUROSEMIDE 10 MG/ML IJ SOLN
40.0000 mg | Freq: Once | INTRAMUSCULAR | Status: AC
  Administered 2022-05-12: 40 mg via INTRAVENOUS
  Filled 2022-05-12: qty 4

## 2022-05-12 NOTE — Plan of Care (Signed)
  Problem: Clinical Measurements: Goal: Ability to maintain clinical measurements within normal limits will improve Outcome: Progressing Goal: Diagnostic test results will improve Outcome: Progressing Goal: Respiratory complications will improve Outcome: Progressing Goal: Cardiovascular complication will be avoided Outcome: Progressing   

## 2022-05-12 NOTE — Progress Notes (Signed)
PT Cancellation Note  Patient Details Name: [name] [name] MRN: [id number] DOB: 05-10-1960   Cancelled Treatment:    Reason Eval/Treat Not Completed: Patient declined, no reason specified patient politely declines any activity with PT today, unable to even convince him to participate in bed exercise or getting up to chair, tells me "I already did a lot of activity today!". Will attempt to return if time/schedule allow.   Deniece Ree PT DPT PN2

## 2022-05-12 NOTE — Progress Notes (Signed)
PROGRESS NOTE        PATIENT DETAILS Name: [name] [name] Age: 62 y.o. Sex: male Date of Birth: 09-16-1960 Admit Date: 05/10/2022 Admitting Physician Marcelyn Bruins, MD WJX:BJYNWG, Charlane Ferretti, MD  Brief Summary: Patient is a 62 y.o.  male with history of COPD, chronic hypoxic respiratory failure on 6 L of oxygen at home-who presented with worsening shortness of breath-found to have acute on chronic hypoxic respiratory failure in setting of COVID-19 pneumonia/COPD exacerbation.  Significant events: 1/31>> admitted [facility]-worsening SOB-acute on chronic hypoxia-COPD exacerbation/COVID infection.  Significant studies: 1/31>> CXR: Bibasilar PNA 2/01>> echo: EF 95-62%, RV systolic function normal.  Mildly elevated pulmonary systolic artery pressure.  Significant microbiology data: 1/31>> COVID PCR: Positive 1/31 >>influenza/RSV PCR: Negative  Procedures: None  Consults: None  Subjective: No major issues overnight-on around 7 L of oxygen this morning.  Continues to complain of exertional dyspnea that is somewhat more than his usual baseline.  Is anxious-he did not get vaccinated and is afraid that he may die like his friend.  Objective: Vitals: Blood pressure (!) 141/85, pulse 92, temperature 99.1 F (37.3 C), temperature source Oral, resp. rate 17, height '5\' 6"'$  (1.676 m), weight 96.8 kg, SpO2 92 %.   Exam: Gen Exam:Alert awake-not in any distress HEENT:atraumatic, normocephalic Chest: B/L clear to auscultation anteriorly CVS:S1S2 regular Abdomen:soft non tender, non distended Extremities:no edema Neurology: Non focal Skin: no rash  Pertinent Labs/Radiology:    Latest Ref Rng & Units 05/12/2022    3:51 AM 05/11/2022    5:09 AM 05/10/2022   10:44 AM  CBC  WBC 4.0 - 10.5 K/uL 15.8  9.9  10.2   Hemoglobin 13.0 - 17.0 g/dL 12.6  12.4  12.8   Hematocrit 39.0 - 52.0 % 41.3  39.7  43.7   Platelets 150 - 400 K/uL 225  212  215     Lab Results  Component  Value Date   NA 135 05/12/2022   K 3.9 05/12/2022   CL 93 (L) 05/12/2022   CO2 33 (H) 05/12/2022      Assessment/Plan: Acute on chronic hypoxic respiratory failure (on 6 L of oxygen at home) COVID-19 PNA COPD exacerbation Likely has COVID-19 PNA causing COPD exacerbation and worsening of his hypoxemia Slowly improving-continue Solu-Medrol/bronchodilators S/p Actemra on 1/31 1 dose of IV Lasix to see if this helps with exertional dyspnea-although no signs of volume overload Attempt to slowly titrate down FiO2-mobilize with nursing staff/PT.  CKD stage IIIa At baseline  HTN BP stable Continue amlodipine/losartan/metoprolol  HLD Statin intolerance Continue Zetia  Hypomagnesemia Repleted  Gout Allopurinol  Obesity: Estimated body mass index is 34.43 kg/m as calculated from the following:   Height as of this encounter: '5\' 6"'$  (1.676 m).   Weight as of this encounter: 96.8 kg.   Code status:   Code Status: Full Code   DVT Prophylaxis: enoxaparin (LOVENOX) injection 40 mg Start: 05/10/22 1400   Family Communication: None at bedside   Disposition Plan: Status is: Inpatient Remains inpatient appropriate because: Severity of illness   Planned Discharge Destination:Home hopefully over the weekend.   Diet: Diet Order             Diet regular Room service appropriate? Yes; Fluid consistency: Thin  Diet effective now  Antimicrobial agents: Anti-infectives (From admission, onward)    None        MEDICATIONS: Scheduled Meds:  allopurinol  100 mg Oral Daily   amLODipine  10 mg Oral Daily   arformoterol  15 mcg Nebulization BID   vitamin C  500 mg Oral Daily   budesonide (PULMICORT) nebulizer solution  0.25 mg Nebulization BID   enoxaparin (LOVENOX) injection  40 mg Subcutaneous Q24H   ezetimibe  10 mg Oral Daily   ipratropium-albuterol  3 mL Nebulization BID   losartan  50 mg Oral Daily   methylPREDNISolone (SOLU-MEDROL)  injection  60 mg Intravenous Q12H   metoprolol succinate  50 mg Oral Daily   sodium chloride flush  3 mL Intravenous Q12H   umeclidinium bromide  1 puff Inhalation Daily   zinc sulfate  220 mg Oral Daily   Continuous Infusions: PRN Meds:.acetaminophen, albuterol, chlorpheniramine-HYDROcodone, guaiFENesin-dextromethorphan, polyethylene glycol   I have personally reviewed following labs and imaging studies  LABORATORY DATA: CBC: Recent Labs  Lab 05/10/22 1044 05/11/22 0509 05/12/22 0351  WBC 10.2 9.9 15.8*  NEUTROABS 7.5 7.3 13.9*  HGB 12.8* 12.4* 12.6*  HCT 43.7 39.7 41.3  MCV 82.3 78.5* 79.1*  PLT 215 212 225     Basic Metabolic Panel: Recent Labs  Lab 05/10/22 1202 05/11/22 0509 05/12/22 0351  NA 135 137 135  K 4.9 3.8 3.9  CL 93* 94* 93*  CO2 34* 33* 33*  GLUCOSE 100* 104* 171*  BUN '8 12 15  '$ CREATININE 1.11 1.12 1.13  CALCIUM 9.3 9.3 9.1  MG  --  1.5* 2.4  PHOS  --  2.9  --      GFR: Estimated Creatinine Clearance: 72.9 mL/min (by C-G formula based on SCr of 1.13 mg/dL).  Liver Function Tests: Recent Labs  Lab 05/10/22 1202 05/11/22 0509 05/12/22 0351  AST 21 23 34  ALT '14 12 15  '$ ALKPHOS 72 66 67  BILITOT 1.3* 1.0 0.7  PROT 7.4 7.3 7.0  ALBUMIN 3.4* 3.2* 3.0*    No results for input(s): "LIPASE", "AMYLASE" in the last 168 hours. No results for input(s): "AMMONIA" in the last 168 hours.  Coagulation Profile: No results for input(s): "INR", "PROTIME" in the last 168 hours.  Cardiac Enzymes: No results for input(s): "CKTOTAL", "CKMB", "CKMBINDEX", "TROPONINI" in the last 168 hours.  BNP (last 3 results) No results for input(s): "PROBNP" in the last 8760 hours.  Lipid Profile: No results for input(s): "CHOL", "HDL", "LDLCALC", "TRIG", "CHOLHDL", "LDLDIRECT" in the last 72 hours.  Thyroid Function Tests: No results for input(s): "TSH", "T4TOTAL", "FREET4", "T3FREE", "THYROIDAB" in the last 72 hours.  Anemia Panel: Recent Labs     05/10/22 2252 05/11/22 0509  FERRITIN 128 133     Urine analysis:    Component Value Date/Time   COLORURINE YELLOW 10/30/2018 0730   APPEARANCEUR CLEAR 10/30/2018 0730   LABSPEC 1.013 10/30/2018 0730   PHURINE 6.0 10/30/2018 0730   GLUCOSEU NEGATIVE 10/30/2018 0730   HGBUR NEGATIVE 10/30/2018 0730   BILIRUBINUR NEGATIVE 10/30/2018 0730   KETONESUR NEGATIVE 10/30/2018 0730   PROTEINUR 30 (A) 10/30/2018 0730   UROBILINOGEN 0.2 12/11/2013 0951   NITRITE NEGATIVE 10/30/2018 0730   LEUKOCYTESUR NEGATIVE 10/30/2018 0730    Sepsis Labs: Lactic Acid, Venous    Component Value Date/Time   LATICACIDVEN 0.8 10/30/2018 0742    MICROBIOLOGY: Recent Results (from the past 240 hour(s))  Resp panel by RT-PCR (RSV, Flu A&B, Covid) Anterior Nasal Swab  Status: Abnormal   Collection Time: 05/10/22 10:46 AM   Specimen: Anterior Nasal Swab  Result Value Ref Range Status   SARS Coronavirus 2 by RT PCR POSITIVE (A) NEGATIVE Final   Influenza A by PCR NEGATIVE NEGATIVE Final   Influenza B by PCR NEGATIVE NEGATIVE Final    Comment: (NOTE) The Xpert Xpress SARS-CoV-2/FLU/RSV plus assay is intended as an aid in the diagnosis of influenza from Nasopharyngeal swab specimens and should not be used as a sole basis for treatment. Nasal washings and aspirates are unacceptable for Xpert Xpress SARS-CoV-2/FLU/RSV testing.  Fact Sheet for Patients: EntrepreneurPulse.com.au  Fact Sheet for Healthcare Providers: IncredibleEmployment.be  This test is not yet approved or cleared by the [place] FDA and has been authorized for detection and/or diagnosis of SARS-CoV-2 by FDA under an Emergency Use Authorization (EUA). This EUA will remain in effect (meaning this test can be used) for the duration of the COVID-19 declaration under Section 564(b)(1) of the Act, 21 U.S.C. section 360bbb-3(b)(1), unless the authorization is terminated or revoked.     Resp  Syncytial Virus by PCR NEGATIVE NEGATIVE Final    Comment: (NOTE) Fact Sheet for Patients: EntrepreneurPulse.com.au  Fact Sheet for Healthcare Providers: IncredibleEmployment.be  This test is not yet approved or cleared by the [place] FDA and has been authorized for detection and/or diagnosis of SARS-CoV-2 by FDA under an Emergency Use Authorization (EUA). This EUA will remain in effect (meaning this test can be used) for the duration of the COVID-19 declaration under Section 564(b)(1) of the Act, 21 U.S.C. section 360bbb-3(b)(1), unless the authorization is terminated or revoked.  Performed at [facility], [place] [address], [place], [place] [address]     RADIOLOGY STUDIES/RESULTS: ECHOCARDIOGRAM COMPLETE  Result Date: 05/11/2022    ECHOCARDIOGRAM REPORT   Patient Name:   ARIEZ NEILAN Date of Exam: 05/11/2022 Medical Rec #:  [id number]   Height:       66.0 in Accession #:    [id number]  Weight:       213.3 lb Date of Birth:  1960/10/24   BSA:          2.055 m Patient Age:    23 years    BP:           132/90 mmHg Patient Gender: M           HR:           87 bpm. Exam Location:  Inpatient Procedure: 2D Echo, Cardiac Doppler and Color Doppler Indications:    Acute Respiratory Distress  History:        Patient has prior history of Echocardiogram examinations, most                 recent 07/21/2016. Pulmonary HTN and COPD; Risk                 Factors:Hypertension, Current Smoker and Dyslipidemia. CKD,                 stage II.  Sonographer:    Ronny Flurry Referring Phys: [place]  Sonographer Comments: No parasternal window and suboptimal apical window. Image acquisition challenging due to uncooperative patient and Image acquisition challenging due to respiratory motion. IMPRESSIONS  1. Left ventricular ejection fraction, by estimation, is 60 to 65%. The left ventricle has normal function. The left ventricle has no regional wall motion  abnormalities. Left ventricular diastolic function could not be evaluated.  2. Right ventricular systolic function  is normal. The right ventricular size is normal. There is mildly elevated pulmonary artery systolic pressure.  3. The mitral valve is normal in structure. No evidence of mitral valve regurgitation.  4. The aortic valve is tricuspid. Aortic valve regurgitation is not visualized.  5. The inferior vena cava is normal in size with greater than 50% respiratory variability, suggesting right atrial pressure of 3 mmHg. FINDINGS  Left Ventricle: Left ventricular ejection fraction, by estimation, is 60 to 65%. The left ventricle has normal function. The left ventricle has no regional wall motion abnormalities. The left ventricular internal cavity size was normal in size. Suboptimal image quality limits for assessment of left ventricular hypertrophy. Left ventricular diastolic function could not be evaluated. Right Ventricle: The right ventricular size is normal. No increase in right ventricular wall thickness. Right ventricular systolic function is normal. There is mildly elevated pulmonary artery systolic pressure. The tricuspid regurgitant velocity is 2.97  m/s, and with an assumed right atrial pressure of 5 mmHg, the estimated right ventricular systolic pressure is 10.3 mmHg. Left Atrium: Left atrial size was normal in size. Right Atrium: Right atrial size was normal in size. Pericardium: There is no evidence of pericardial effusion. Mitral Valve: The mitral valve is normal in structure. No evidence of mitral valve regurgitation. Tricuspid Valve: The tricuspid valve is normal in structure. Tricuspid valve regurgitation is trivial. Aortic Valve: The aortic valve is tricuspid. Aortic valve regurgitation is not visualized. Aortic valve mean gradient measures 4.0 mmHg. Aortic valve peak gradient measures 8.3 mmHg. Pulmonic Valve: The pulmonic valve was not well visualized. Pulmonic valve regurgitation is not  visualized. Aorta: The aortic root was not well visualized and the ascending aorta was not well visualized. Venous: The inferior vena cava is normal in size with greater than 50% respiratory variability, suggesting right atrial pressure of 3 mmHg. IAS/Shunts: The interatrial septum was not assessed.  AORTIC VALVE AV Vmax:           144.00 cm/s AV Vmean:          94.100 cm/s AV VTI:            0.237 m AV Peak Grad:      8.3 mmHg AV Mean Grad:      4.0 mmHg LVOT Vmax:         126.00 cm/s LVOT Vmean:        76.600 cm/s LVOT VTI:          0.190 m LVOT/AV VTI ratio: 0.80 TRICUSPID VALVE TR Peak grad:   35.3 mmHg TR Vmax:        297.00 cm/s  SHUNTS Systemic VTI: 0.19 m Glori Bickers MD Electronically signed by Glori Bickers MD Signature Date/Time: 05/11/2022/6:16:52 PM    Final      LOS: 2 days   Oren Binet, MD  [facility]    To contact the attending provider between 7A-7P or the covering provider during after hours 7P-7A, please log into the web site [email or web address] and access using universal  password for that web site. If you do not have the password, please call the hospital operator.  05/12/2022, 12:10 PM

## 2022-05-12 NOTE — Evaluation (Signed)
Occupational Therapy Evaluation Patient Details Name: [name] [name] MRN: [id number] DOB: 1960-09-15 Today's Date: 05/12/2022   History of Present Illness 62 y.o. male admitted 1/31 for acute on chronic hypoxic respiratory failure in setting of COVID-19 pneumonia/COPD exacerbation. PMHx: COPD, chronic hypoxic respiratory failure on 6 L of oxygen at home-who presented with worsening shortness of breath-found to have   Clinical Impression   Pt currently at supervision level for simulated selfcare and transfers without an assistive device in the room.  Oxygen sats still decreasing down to 80% with short distance mobility on 8Ls nasal cannula taking greater than 2 mins during this session to increase above 90%.  HR elevating up to 120 BPM as well with mobility.  Feel he will benefit from acute care OT to help with education on energy conservation techniques and help with increasing endurance and completion of selfcare tasks sit to stand.        Recommendations for follow up therapy are one component of a multi-disciplinary discharge planning process, led by the attending physician.  Recommendations may be updated based on patient status, additional functional criteria and insurance authorization.   Follow Up Recommendations  No OT follow up     Assistance Recommended at Discharge PRN  Patient can return home with the following Assistance with cooking/housework    Functional Status Assessment  Patient has had a recent decline in their functional status and demonstrates the ability to make significant improvements in function in a reasonable and predictable amount of time.  Equipment Recommendations  None recommended by OT (pt declines shower seat)       Precautions / Restrictions Precautions Precautions: None Precaution Comments: Monitor O2 Restrictions Weight Bearing Restrictions: No      Mobility Bed Mobility                    Transfers   Equipment used: None Transfers:  Sit to/from Stand Sit to Stand: Supervision           General transfer comment: Supervision without use of an assistive device in the room.      Balance Overall balance assessment: Mild deficits observed, not formally tested                                         ADL either performed or assessed with clinical judgement   ADL Overall ADL's : Needs assistance/impaired Eating/Feeding: Independent;Sitting   Grooming: Wash/dry hands;Wash/dry face;Sitting Grooming Details (indicate cue type and reason): setup simulated Upper Body Bathing: Set up;Sitting   Lower Body Bathing: Sit to/from stand;Supervison/ safety Lower Body Bathing Details (indicate cue type and reason): simulated Upper Body Dressing : Set up;Sitting Upper Body Dressing Details (indicate cue type and reason): simulated Lower Body Dressing: Sit to/from stand;Supervision/safety Lower Body Dressing Details (indicate cue type and reason): simulated Toilet Transfer: Supervision/safety;Ambulation Toilet Transfer Details (indicate cue type and reason): no assistive device Toileting- Clothing Manipulation and Hygiene: Supervision/safety;Sit to/from stand       Functional mobility during ADLs: Supervision/safety General ADL Comments: HR increasing from 100 to 120 with mobility without an assistive device in the room.  Oxygen sats 85-92% on 6 Ls at rest decreasing down to 80% on 8Ls with mobility.  Oxygen still fluctuated with rest increasing up to 88% on Ls and then down to 84.  Eventually after greater than 2 mins, he was back up above 90%.  No  significant dyspnea noted greater than 2/4.  He reported feeling like he needed a breathing treatment, so respiratory was made aware.     Vision Baseline Vision/History: 1 Wears glasses Ability to See in Adequate Light: 0 Adequate Patient Visual Report: No change from baseline Vision Assessment?: No apparent visual deficits     Perception  Not tested   Praxis   Not tested    Pertinent Vitals/Pain Pain Assessment Pain Assessment: No/denies pain     Hand Dominance Right   Extremity/Trunk Assessment Upper Extremity Assessment Upper Extremity Assessment: Overall WFL for tasks assessed   Lower Extremity Assessment Lower Extremity Assessment: Defer to PT evaluation   Cervical / Trunk Assessment Cervical / Trunk Assessment: Normal   Communication Communication Communication: No difficulties   Cognition Arousal/Alertness: Awake/alert Behavior During Therapy: WFL for tasks assessed/performed Overall Cognitive Status: Within Functional Limits for tasks assessed                                                  Home Living Family/patient expects to be discharged to:: Private residence Living Arrangements: Children;Other relatives Available Help at Discharge: Family;Available 24 hours/day Type of Home: House Home Access: Level entry     Home Layout: One level     Bathroom Shower/Tub: Teacher, early years/pre: Standard     Home Equipment: None;Other (comment) (home O2)          Prior Functioning/Environment Prior Level of Function : Independent/Modified Independent             Mobility Comments: Denies falls, ambulates without AD, limited by COPD          OT Problem List: Decreased activity tolerance;Cardiopulmonary status limiting activity;Decreased knowledge of use of DME or AE;Impaired balance (sitting and/or standing)      OT Treatment/Interventions: Energy conservation;Therapeutic activities;Self-care/ADL training;Patient/family education;Balance training;DME and/or AE instruction    OT Goals(Current goals can be found in the care plan section) Acute Rehab OT Goals Patient Stated Goal: Pt wants to get better with breathing before he goes home. OT Goal Formulation: With patient Time For Goal Achievement: 05/26/22 Potential to Achieve Goals: Good  OT Frequency: Min 2X/week        AM-PAC OT "6 Clicks" Daily Activity     Outcome Measure Help from another person eating meals?: None Help from another person taking care of personal grooming?: A Little Help from another person toileting, which includes using toliet, bedpan, or urinal?: A Little Help from another person bathing (including washing, rinsing, drying)?: A Little Help from another person to put on and taking off regular upper body clothing?: None Help from another person to put on and taking off regular lower body clothing?: A Little 6 Click Score: 20   End of Session Equipment Utilized During Treatment: Gait belt;Oxygen  Activity Tolerance: Patient limited by fatigue Patient left: in bed;with call bell/phone within reach;Other (comment) (respiratory present)  OT Visit Diagnosis: Unsteadiness on feet (R26.81);Muscle weakness (generalized) (M62.81)                Time: 4098-1191 OT Time Calculation (min): 40 min Charges:  OT General Charges $OT Visit: 1 Visit OT Evaluation $OT Eval Moderate Complexity: 1 Mod OT Treatments $Self Care/Home Management : 23-37 mins  Letishia Elliott OTR/L 05/12/2022, 1:29 PM

## 2022-05-13 DIAGNOSIS — J441 Chronic obstructive pulmonary disease with (acute) exacerbation: Secondary | ICD-10-CM | POA: Diagnosis not present

## 2022-05-13 DIAGNOSIS — J9621 Acute and chronic respiratory failure with hypoxia: Secondary | ICD-10-CM | POA: Diagnosis not present

## 2022-05-13 DIAGNOSIS — E669 Obesity, unspecified: Secondary | ICD-10-CM | POA: Diagnosis not present

## 2022-05-13 DIAGNOSIS — N182 Chronic kidney disease, stage 2 (mild): Secondary | ICD-10-CM | POA: Diagnosis not present

## 2022-05-13 DIAGNOSIS — U071 COVID-19: Secondary | ICD-10-CM | POA: Diagnosis not present

## 2022-05-13 LAB — CBC WITH DIFFERENTIAL/PLATELET
Abs Immature Granulocytes: 0.19 10*3/uL — ABNORMAL HIGH (ref 0.00–0.07)
Basophils Absolute: 0 10*3/uL (ref 0.0–0.1)
Basophils Relative: 0 %
Eosinophils Absolute: 0 10*3/uL (ref 0.0–0.5)
Eosinophils Relative: 0 %
HCT: 43.4 % (ref 39.0–52.0)
Hemoglobin: 13.5 g/dL (ref 13.0–17.0)
Immature Granulocytes: 1 %
Lymphocytes Relative: 7 %
Lymphs Abs: 1.5 10*3/uL (ref 0.7–4.0)
MCH: 24.2 pg — ABNORMAL LOW (ref 26.0–34.0)
MCHC: 31.1 g/dL (ref 30.0–36.0)
MCV: 77.8 fL — ABNORMAL LOW (ref 80.0–100.0)
Monocytes Absolute: 0.9 10*3/uL (ref 0.1–1.0)
Monocytes Relative: 4 %
Neutro Abs: 18.8 10*3/uL — ABNORMAL HIGH (ref 1.7–7.7)
Neutrophils Relative %: 88 %
Platelets: 274 10*3/uL (ref 150–400)
RBC: 5.58 MIL/uL (ref 4.22–5.81)
RDW: 12.7 % (ref 11.5–15.5)
WBC: 21.4 10*3/uL — ABNORMAL HIGH (ref 4.0–10.5)
nRBC: 0 % (ref 0.0–0.2)

## 2022-05-13 LAB — COMPREHENSIVE METABOLIC PANEL
ALT: 25 U/L (ref 0–44)
AST: 29 U/L (ref 15–41)
Albumin: 3.2 g/dL — ABNORMAL LOW (ref 3.5–5.0)
Alkaline Phosphatase: 65 U/L (ref 38–126)
Anion gap: 9 (ref 5–15)
BUN: 23 mg/dL (ref 8–23)
CO2: 32 mmol/L (ref 22–32)
Calcium: 9 mg/dL (ref 8.9–10.3)
Chloride: 95 mmol/L — ABNORMAL LOW (ref 98–111)
Creatinine, Ser: 1.26 mg/dL — ABNORMAL HIGH (ref 0.61–1.24)
GFR, Estimated: >60 mL/min (ref >60)
Glucose, Bld: 112 mg/dL — ABNORMAL HIGH (ref 70–99)
Potassium: 4.6 mmol/L (ref 3.5–5.1)
Sodium: 136 mmol/L (ref 135–145)
Total Bilirubin: 0.6 mg/dL (ref 0.3–1.2)
Total Protein: 6.9 g/dL (ref 6.5–8.1)

## 2022-05-13 MED ORDER — DOCUSATE SODIUM 100 MG PO CAPS
100.0000 mg | ORAL_CAPSULE | Freq: Two times a day (BID) | ORAL | Status: DC
  Administered 2022-05-13 – 2022-05-14 (×2): 100 mg via ORAL
  Filled 2022-05-13 (×2): qty 1

## 2022-05-13 MED ORDER — PREDNISONE 20 MG PO TABS
40.0000 mg | ORAL_TABLET | Freq: Every day | ORAL | Status: DC
  Administered 2022-05-14: 40 mg via ORAL
  Filled 2022-05-13: qty 2

## 2022-05-13 NOTE — Progress Notes (Signed)
PROGRESS NOTE        PATIENT DETAILS Name: [name] [name] Age: 62 y.o. Sex: male Date of Birth: November 27, 1960 Admit Date: 05/10/2022 Admitting Physician Marcelyn Bruins, MD QPR:[id number], Charlane Ferretti, MD  Brief Summary: Patient is a 62 y.o.  male with history of COPD, chronic hypoxic respiratory failure on 6 L of oxygen at home-who presented with worsening shortness of breath-found to have acute on chronic hypoxic respiratory failure in setting of COVID-19 pneumonia/COPD exacerbation.  Significant events: 1/31>> admitted [facility]-worsening SOB-acute on chronic hypoxia-COPD exacerbation/COVID infection.  Significant studies: 1/31>> CXR: Bibasilar PNA 2/01>> echo: EF 66-59%, RV systolic function normal.  Mildly elevated pulmonary systolic artery pressure.  Significant microbiology data: 1/31>> COVID PCR: Positive 1/31 >>influenza/RSV PCR: Negative  Procedures: None  Consults: None  Subjective: Lying comfortably in bed-feels better-spoke with nursing staff-still requiring HFNC-not able to go back to his usual regimen of simple nasal cannula desaturation.  Objective: Vitals: Blood pressure (!) 148/96, pulse 92, temperature 99.8 F (37.7 C), temperature source Oral, resp. rate 20, height '5\' 6"'$  (1.676 m), weight 96.8 kg, SpO2 90 %.   Exam: Gen Exam:Alert awake-not in any distress HEENT:atraumatic, normocephalic Chest: B/L clear to auscultation anteriorly CVS:S1S2 regular Abdomen:soft non tender, non distended Extremities:no edema Neurology: Non focal Skin: no rash  Pertinent Labs/Radiology:    Latest Ref Rng & Units 05/13/2022    1:43 AM 05/12/2022    3:51 AM 05/11/2022    5:09 AM  CBC  WBC 4.0 - 10.5 K/uL 21.4  15.8  9.9   Hemoglobin 13.0 - 17.0 g/dL 13.5  12.6  12.4   Hematocrit 39.0 - 52.0 % 43.4  41.3  39.7   Platelets 150 - 400 K/uL 274  225  212     Lab Results  Component Value Date   NA 136 05/13/2022   K 4.6 05/13/2022   CL 95 (L) 05/13/2022    CO2 32 05/13/2022      Assessment/Plan: Acute on chronic hypoxic respiratory failure (on 6 L of oxygen at home) COVID-19 PNA COPD exacerbation Likely has COVID-19 PNA causing COPD exacerbation and worsening of his hypoxemia Slowly improving-continue steroids/bronchodilators S/p Actemra 1/31 No evidence of volume overload-s/p 1 dose of IV Lasix yesterday Continue to attempt to slowly titrate down FiO2.    CKD stage IIIa At baseline  HTN BP stable Continue amlodipine/losartan/metoprolol  HLD Statin intolerance Continue Zetia  Hypomagnesemia Repleted  Gout Allopurinol  Obesity: Estimated body mass index is 34.43 kg/m as calculated from the following:   Height as of this encounter: '5\' 6"'$  (1.676 m).   Weight as of this encounter: 96.8 kg.   Code status:   Code Status: Full Code   DVT Prophylaxis: enoxaparin (LOVENOX) injection 40 mg Start: 05/10/22 1400   Family Communication: None at bedside   Disposition Plan: Status is: Inpatient Remains inpatient appropriate because: Severity of illness   Planned Discharge Destination:Home hopefully over the weekend.   Diet: Diet Order             Diet regular Room service appropriate? Yes; Fluid consistency: Thin  Diet effective now                     Antimicrobial agents: Anti-infectives (From admission, onward)    None        MEDICATIONS: Scheduled Meds:  allopurinol  100 mg  Oral Daily   amLODipine  10 mg Oral Daily   arformoterol  15 mcg Nebulization BID   vitamin C  500 mg Oral Daily   budesonide (PULMICORT) nebulizer solution  0.25 mg Nebulization BID   enoxaparin (LOVENOX) injection  40 mg Subcutaneous Q24H   ezetimibe  10 mg Oral Daily   ipratropium-albuterol  3 mL Nebulization BID   losartan  50 mg Oral Daily   methylPREDNISolone (SOLU-MEDROL) injection  60 mg Intravenous Q12H   metoprolol succinate  50 mg Oral Daily   sodium chloride flush  3 mL Intravenous Q12H   umeclidinium bromide   1 puff Inhalation Daily   zinc sulfate  220 mg Oral Daily   Continuous Infusions: PRN Meds:.acetaminophen, albuterol, chlorpheniramine-HYDROcodone, guaiFENesin-dextromethorphan, polyethylene glycol   I have personally reviewed following labs and imaging studies  LABORATORY DATA: CBC: Recent Labs  Lab 05/10/22 1044 05/11/22 0509 05/12/22 0351 05/13/22 0143  WBC 10.2 9.9 15.8* 21.4*  NEUTROABS 7.5 7.3 13.9* 18.8*  HGB 12.8* 12.4* 12.6* 13.5  HCT 43.7 39.7 41.3 43.4  MCV 82.3 78.5* 79.1* 77.8*  PLT 215 212 225 274     Basic Metabolic Panel: Recent Labs  Lab 05/10/22 1202 05/11/22 0509 05/12/22 0351 05/13/22 0143  NA 135 137 135 136  K 4.9 3.8 3.9 4.6  CL 93* 94* 93* 95*  CO2 34* 33* 33* 32  GLUCOSE 100* 104* 171* 112*  BUN '8 12 15 23  '$ CREATININE 1.11 1.12 1.13 1.26*  CALCIUM 9.3 9.3 9.1 9.0  MG  --  1.5* 2.4  --   PHOS  --  2.9  --   --      GFR: Estimated Creatinine Clearance: 65.4 mL/min (A) (by C-G formula based on SCr of 1.26 mg/dL (H)).  Liver Function Tests: Recent Labs  Lab 05/10/22 1202 05/11/22 0509 05/12/22 0351 05/13/22 0143  AST 21 23 34 29  ALT '14 12 15 25  '$ ALKPHOS 72 66 67 65  BILITOT 1.3* 1.0 0.7 0.6  PROT 7.4 7.3 7.0 6.9  ALBUMIN 3.4* 3.2* 3.0* 3.2*    No results for input(s): "LIPASE", "AMYLASE" in the last 168 hours. No results for input(s): "AMMONIA" in the last 168 hours.  Coagulation Profile: No results for input(s): "INR", "PROTIME" in the last 168 hours.  Cardiac Enzymes: No results for input(s): "CKTOTAL", "CKMB", "CKMBINDEX", "TROPONINI" in the last 168 hours.  BNP (last 3 results) No results for input(s): "PROBNP" in the last 8760 hours.  Lipid Profile: No results for input(s): "CHOL", "HDL", "LDLCALC", "TRIG", "CHOLHDL", "LDLDIRECT" in the last 72 hours.  Thyroid Function Tests: No results for input(s): "TSH", "T4TOTAL", "FREET4", "T3FREE", "THYROIDAB" in the last 72 hours.  Anemia Panel: Recent Labs     05/10/22 2252 05/11/22 0509  FERRITIN 128 133     Urine analysis:    Component Value Date/Time   COLORURINE YELLOW 10/30/2018 0730   APPEARANCEUR CLEAR 10/30/2018 0730   LABSPEC 1.013 10/30/2018 0730   PHURINE 6.0 10/30/2018 0730   GLUCOSEU NEGATIVE 10/30/2018 0730   HGBUR NEGATIVE 10/30/2018 0730   BILIRUBINUR NEGATIVE 10/30/2018 0730   KETONESUR NEGATIVE 10/30/2018 0730   PROTEINUR 30 (A) 10/30/2018 0730   UROBILINOGEN 0.2 12/11/2013 0951   NITRITE NEGATIVE 10/30/2018 0730   LEUKOCYTESUR NEGATIVE 10/30/2018 0730    Sepsis Labs: Lactic Acid, Venous    Component Value Date/Time   LATICACIDVEN 0.8 10/30/2018 0742    MICROBIOLOGY: Recent Results (from the past 240 hour(s))  Resp panel by RT-PCR (RSV,  Flu A&B, Covid) Anterior Nasal Swab     Status: Abnormal   Collection Time: 05/10/22 10:46 AM   Specimen: Anterior Nasal Swab  Result Value Ref Range Status   SARS Coronavirus 2 by RT PCR POSITIVE (A) NEGATIVE Final   Influenza A by PCR NEGATIVE NEGATIVE Final   Influenza B by PCR NEGATIVE NEGATIVE Final    Comment: (NOTE) The Xpert Xpress SARS-CoV-2/FLU/RSV plus assay is intended as an aid in the diagnosis of influenza from Nasopharyngeal swab specimens and should not be used as a sole basis for treatment. Nasal washings and aspirates are unacceptable for Xpert Xpress SARS-CoV-2/FLU/RSV testing.  Fact Sheet for Patients: EntrepreneurPulse.com.au  Fact Sheet for Healthcare Providers: IncredibleEmployment.be  This test is not yet approved or cleared by the [place] FDA and has been authorized for detection and/or diagnosis of SARS-CoV-2 by FDA under an Emergency Use Authorization (EUA). This EUA will remain in effect (meaning this test can be used) for the duration of the COVID-19 declaration under Section 564(b)(1) of the Act, 21 U.S.C. section 360bbb-3(b)(1), unless the authorization is terminated or revoked.     Resp  Syncytial Virus by PCR NEGATIVE NEGATIVE Final    Comment: (NOTE) Fact Sheet for Patients: EntrepreneurPulse.com.au  Fact Sheet for Healthcare Providers: IncredibleEmployment.be  This test is not yet approved or cleared by the [place] FDA and has been authorized for detection and/or diagnosis of SARS-CoV-2 by FDA under an Emergency Use Authorization (EUA). This EUA will remain in effect (meaning this test can be used) for the duration of the COVID-19 declaration under Section 564(b)(1) of the Act, 21 U.S.C. section 360bbb-3(b)(1), unless the authorization is terminated or revoked.  Performed at [facility], [place] [address] [place], [place] [address]     RADIOLOGY STUDIES/RESULTS: ECHOCARDIOGRAM COMPLETE  Result Date: 05/11/2022    ECHOCARDIOGRAM REPORT   Patient Name:   [name] [name] Date of Exam: 05/11/2022 Medical Rec #:  [id number]   Height:       66.0 in Accession #:    [id number]  Weight:       213.3 lb Date of Birth:  08/15/1960   BSA:          2.055 m Patient Age:    42 years    BP:           132/90 mmHg Patient Gender: M           HR:           87 bpm. Exam Location:  Inpatient Procedure: 2D Echo, Cardiac Doppler and Color Doppler Indications:    Acute Respiratory Distress  History:        Patient has prior history of Echocardiogram examinations, most                 recent 07/21/2016. Pulmonary HTN and COPD; Risk                 Factors:Hypertension, Current Smoker and Dyslipidemia. CKD,                 stage II.  Sonographer:    Ronny Flurry Referring Phys: [place]  Sonographer Comments: No parasternal window and suboptimal apical window. Image acquisition challenging due to uncooperative patient and Image acquisition challenging due to respiratory motion. IMPRESSIONS  1. Left ventricular ejection fraction, by estimation, is 60 to 65%. The left ventricle has normal function. The left ventricle has no regional wall motion  abnormalities. Left ventricular diastolic function  could not be evaluated.  2. Right ventricular systolic function is normal. The right ventricular size is normal. There is mildly elevated pulmonary artery systolic pressure.  3. The mitral valve is normal in structure. No evidence of mitral valve regurgitation.  4. The aortic valve is tricuspid. Aortic valve regurgitation is not visualized.  5. The inferior vena cava is normal in size with greater than 50% respiratory variability, suggesting right atrial pressure of 3 mmHg. FINDINGS  Left Ventricle: Left ventricular ejection fraction, by estimation, is 60 to 65%. The left ventricle has normal function. The left ventricle has no regional wall motion abnormalities. The left ventricular internal cavity size was normal in size. Suboptimal image quality limits for assessment of left ventricular hypertrophy. Left ventricular diastolic function could not be evaluated. Right Ventricle: The right ventricular size is normal. No increase in right ventricular wall thickness. Right ventricular systolic function is normal. There is mildly elevated pulmonary artery systolic pressure. The tricuspid regurgitant velocity is 2.97  m/s, and with an assumed right atrial pressure of 5 mmHg, the estimated right ventricular systolic pressure is 85.8 mmHg. Left Atrium: Left atrial size was normal in size. Right Atrium: Right atrial size was normal in size. Pericardium: There is no evidence of pericardial effusion. Mitral Valve: The mitral valve is normal in structure. No evidence of mitral valve regurgitation. Tricuspid Valve: The tricuspid valve is normal in structure. Tricuspid valve regurgitation is trivial. Aortic Valve: The aortic valve is tricuspid. Aortic valve regurgitation is not visualized. Aortic valve mean gradient measures 4.0 mmHg. Aortic valve peak gradient measures 8.3 mmHg. Pulmonic Valve: The pulmonic valve was not well visualized. Pulmonic valve regurgitation is not  visualized. Aorta: The aortic root was not well visualized and the ascending aorta was not well visualized. Venous: The inferior vena cava is normal in size with greater than 50% respiratory variability, suggesting right atrial pressure of 3 mmHg. IAS/Shunts: The interatrial septum was not assessed.  AORTIC VALVE AV Vmax:           144.00 cm/s AV Vmean:          94.100 cm/s AV VTI:            0.237 m AV Peak Grad:      8.3 mmHg AV Mean Grad:      4.0 mmHg LVOT Vmax:         126.00 cm/s LVOT Vmean:        76.600 cm/s LVOT VTI:          0.190 m LVOT/AV VTI ratio: 0.80 TRICUSPID VALVE TR Peak grad:   35.3 mmHg TR Vmax:        297.00 cm/s  SHUNTS Systemic VTI: 0.19 m Glori Bickers MD Electronically signed by Glori Bickers MD Signature Date/Time: 05/11/2022/6:16:52 PM    Final      LOS: 3 days   Oren Binet, MD  [facility]    To contact the attending provider between 7A-7P or the covering provider during after hours 7P-7A, please log into the web site [email or web address] and access using universal [place] password for that web site. If you do not have the password, please call the hospital operator.  05/13/2022, 12:23 PM

## 2022-05-14 DIAGNOSIS — E669 Obesity, unspecified: Secondary | ICD-10-CM | POA: Diagnosis not present

## 2022-05-14 DIAGNOSIS — U071 COVID-19: Secondary | ICD-10-CM | POA: Diagnosis not present

## 2022-05-14 DIAGNOSIS — J441 Chronic obstructive pulmonary disease with (acute) exacerbation: Secondary | ICD-10-CM | POA: Diagnosis not present

## 2022-05-14 DIAGNOSIS — J9621 Acute and chronic respiratory failure with hypoxia: Secondary | ICD-10-CM | POA: Diagnosis not present

## 2022-05-14 DIAGNOSIS — N182 Chronic kidney disease, stage 2 (mild): Secondary | ICD-10-CM | POA: Diagnosis not present

## 2022-05-14 LAB — BASIC METABOLIC PANEL
Anion gap: 10 (ref 5–15)
BUN: 33 mg/dL — ABNORMAL HIGH (ref 8–23)
CO2: 30 mmol/L (ref 22–32)
Calcium: 8.7 mg/dL — ABNORMAL LOW (ref 8.9–10.3)
Chloride: 92 mmol/L — ABNORMAL LOW (ref 98–111)
Creatinine, Ser: 1.41 mg/dL — ABNORMAL HIGH (ref 0.61–1.24)
GFR, Estimated: 56 mL/min — ABNORMAL LOW (ref >60)
Glucose, Bld: 99 mg/dL (ref 70–99)
Potassium: 3.6 mmol/L (ref 3.5–5.1)
Sodium: 132 mmol/L — ABNORMAL LOW (ref 135–145)

## 2022-05-14 LAB — CBC
HCT: 44.8 % (ref 39.0–52.0)
Hemoglobin: 13.7 g/dL (ref 13.0–17.0)
MCH: 24.1 pg — ABNORMAL LOW (ref 26.0–34.0)
MCHC: 30.6 g/dL (ref 30.0–36.0)
MCV: 78.7 fL — ABNORMAL LOW (ref 80.0–100.0)
Platelets: 262 10*3/uL (ref 150–400)
RBC: 5.69 MIL/uL (ref 4.22–5.81)
RDW: 12.7 % (ref 11.5–15.5)
WBC: 24.7 10*3/uL — ABNORMAL HIGH (ref 4.0–10.5)
nRBC: 0 % (ref 0.0–0.2)

## 2022-05-14 MED ORDER — LOSARTAN POTASSIUM 50 MG PO TABS
50.0000 mg | ORAL_TABLET | Freq: Every day | ORAL | Status: DC
  Filled 2022-05-14: qty 1

## 2022-05-14 MED ORDER — BENZONATATE 100 MG PO CAPS: 100.0000 mg | ORAL_CAPSULE | Freq: Three times a day (TID) | ORAL | 0 refills | Status: AC | PRN

## 2022-05-14 MED ORDER — PREDNISONE 10 MG PO TABS: ORAL_TABLET | ORAL | 0 refills | Status: DC

## 2022-05-14 NOTE — Discharge Summary (Signed)
PATIENT DETAILS Name: [name] [name] Age: 62 y.o. Sex: male Date of Birth: [birth year] MRN: [id number]. Admitting Physician: Marcelyn Bruins, MD OYD:XAJOIN, Charlane Ferretti, MD  Admit Date: 05/10/2022 Discharge date: 05/14/2022  Recommendations for Outpatient Follow-up:  Follow up with PCP in 1-2 weeks Please obtain CMP/CBC in one week  Admitted From:  Home  Disposition: Home   Discharge Condition: fair  CODE STATUS:   Code Status: Full Code   Diet recommendation:  Diet Order             Diet - low sodium heart healthy           Diet regular Room service appropriate? Yes; Fluid consistency: Thin  Diet effective now                    Brief Summary: Patient is a 62 y.o.  male with history of COPD, chronic hypoxic respiratory failure on 6 L of oxygen at home-who presented with worsening shortness of breath-found to have acute on chronic hypoxic respiratory failure in setting of COVID-19 pneumonia/COPD exacerbation.   Significant events: 1/31>> admitted [facility]-worsening SOB-acute on chronic hypoxia-COPD exacerbation/COVID infection.   Significant studies: 1/31>> CXR: Bibasilar PNA 2/01>> echo: EF 86-76%, RV systolic function normal.  Mildly elevated pulmonary systolic artery pressure.   Significant microbiology data: 1/31>> COVID PCR: Positive 1/31 >>influenza/RSV PCR: Negative   Procedures: None   Consults: None  Brief Hospital Course: Acute on chronic hypoxic respiratory failure (on 6 L of oxygen at home) COVID-19 PNA COPD exacerbation Likely has COVID-19 PNA causing COPD exacerbation and worsening of his hypoxemia Much better with steroids/bronchodilators-received 1 dose of Actemra 1/31. Initially on HFNC but has been transitioned to his usual regimen of 6 L of oxygen via [place] today. Overall he feels better-and claims that he is almost back to his baseline.   AKI on CKD stage IIIa Mild AKI-likely due to COVID-19 infection-use of losartan-recent use of Lasix  to ensure he was in negative balance Hold losartan for a few days-repeat electrolytes at PCPs office.  Leukocytosis Due to steroid use-no indication of infection-steroid being tapered down-should improve over the next few days-repeat CBC in 1 week   HTN BP stable Continue amlodipine/metoprolol Resume losartan when kidney function improves further.   HLD Statin intolerance Continue Zetia   Hypomagnesemia Repleted   Gout Allopurinol   Obesity: Estimated body mass index is 34.43 kg/m as calculated from the following:   Height as of this encounter: '5\' 6"'$  (1.676 m).   Weight as of this encounter: 96.8 kg.    Discharge Diagnoses:  Principal Problem:   Acute on chronic respiratory failure with hypoxia (HCC) Active Problems:   CKD (chronic kidney disease) stage 2, GFR 60-89 ml/min   Gout   COPD with acute exacerbation (HCC)   Hypertension   Moderate to severe pulmonary hypertension (HCC)   Class 2 obesity   Hyperlipidemia   COVID-19 virus infection   Discharge Instructions:  Activity:  As tolerated with Full fall precautions use walker/cane & assistance as needed  Discharge Instructions     Call MD for:  difficulty breathing, headache or visual disturbances   Complete by: As directed    Call MD for:  persistant nausea and vomiting   Complete by: As directed    Call MD for:  severe uncontrolled pain   Complete by: As directed    Diet - low sodium heart healthy   Complete by: As directed    Discharge instructions  Complete by: As directed    Follow with Primary MD  Charlott Rakes, MD in 1-2 weeks  Please get a complete blood count and chemistry panel checked by your Primary MD at your next visit, and again as instructed by your Primary MD.  Losartan on hold for now-resume after only talking with your primary care practitioner.  You had very mild renal insufficiency in the setting of COVID-19 infection-hence holding losartan for now. Get Medicines reviewed and  adjusted: Please take all your medications with you for your next visit with your Primary MD  Laboratory/radiological data: Please request your Primary MD to go over all hospital tests and procedure/radiological results at the follow up, please ask your Primary MD to get all Hospital records sent to his/her office.  In some cases, they will be blood work, cultures and biopsy results pending at the time of your discharge. Please request that your primary care M.D. follows up on these results.  Also Note the following: If you experience worsening of your admission symptoms, develop shortness of breath, life threatening emergency, suicidal or homicidal thoughts you must seek medical attention immediately by calling 911 or calling your MD immediately  if symptoms less severe.  You must read complete instructions/literature along with all the possible adverse reactions/side effects for all the Medicines you take and that have been prescribed to you. Take any new Medicines after you have completely understood and accpet all the possible adverse reactions/side effects.   Do not drive when taking Pain medications or sleeping medications (Benzodaizepines)  Do not take more than prescribed Pain, Sleep and Anxiety Medications. It is not advisable to combine anxiety,sleep and pain medications without talking with your primary care practitioner  Special Instructions: If you have smoked or chewed Tobacco  in the last 2 yrs please stop smoking, stop any regular Alcohol  and or any Recreational drug use.  Wear Seat belts while driving.  Please note: You were cared for by a hospitalist during your hospital stay. Once you are discharged, your primary care physician will handle any further medical issues. Please note that NO REFILLS for any discharge medications will be authorized once you are discharged, as it is imperative that you return to your primary care physician (or establish a relationship with a  primary care physician if you do not have one) for your post hospital discharge needs so that they can reassess your need for medications and monitor your lab values.   Increase activity slowly   Complete by: As directed       Allergies as of 05/14/2022       Reactions   Atorvastatin Itching, Swelling   Facial, tongue swelling   Shellfish Allergy Anaphylaxis   Colchicine Hives        Medication List     STOP taking these medications    ammonium lactate 12 % lotion Commonly known as: LAC-HYDRIN   cyclobenzaprine 5 MG tablet Commonly known as: FLEXERIL   gabapentin 300 MG capsule Commonly known as: NEURONTIN   losartan 50 MG tablet Commonly known as: COZAAR   triamcinolone ointment 0.1 % Commonly known as: KENALOG       TAKE these medications    allopurinol 100 MG tablet Commonly known as: ZYLOPRIM Take 1 tablet (100 mg total) by mouth daily.   amLODipine 10 MG tablet Commonly known as: NORVASC Take 1 tablet (10 mg total) by mouth daily.   benzonatate 100 MG capsule Commonly known as: Tessalon Perles Take 1 capsule (100  mg total) by mouth 3 (three) times daily as needed for cough.   cyanocobalamin 500 MCG tablet Commonly known as: VITAMIN B12 Take 500 mcg by mouth daily.   diphenhydrAMINE 25 MG tablet Commonly known as: BENADRYL Take 50 mg by mouth daily as needed (allergies).   ezetimibe 10 MG tablet Commonly known as: ZETIA Take 1 tablet (10 mg total) by mouth daily.   guaiFENesin-dextromethorphan 100-10 MG/5ML syrup Commonly known as: ROBITUSSIN DM Take 10 mLs by mouth every 4 (four) hours as needed for cough.   hydrocortisone 1 % ointment Apply 1 application topically 2 (two) times daily.   ibuprofen 200 MG tablet Commonly known as: ADVIL Take 800 mg by mouth daily as needed (gout swelling).   metoprolol succinate 50 MG 24 hr tablet Commonly known as: TOPROL-XL Take 1 tablet (50 mg total) by mouth daily.   Otezla 30 MG Tabs Generic  drug: Apremilast Take 30 mg by mouth 2 (two) times daily.   predniSONE 10 MG tablet Commonly known as: DELTASONE Take 40 mg daily for 1 day, 30 mg daily for 1 day, 20 mg daily for 1 days,10 mg daily for 1 day, then stop   Spiriva HandiHaler 18 MCG inhalation capsule Generic drug: tiotropium PLACE 1 CAPSULE (18 MCG TOTAL) INTO INHALER AND INHALE DAILY.   Symbicort 160-4.5 MCG/ACT inhaler Generic drug: budesonide-formoterol INHALE 2 PUFFS INTO THE LUNGS TWICE DAILY.   Ventolin HFA 108 (90 Base) MCG/ACT inhaler Generic drug: albuterol inhale 2 pufss every 6 hours as needed for wheezing or shortness of breath   albuterol (2.5 MG/3ML) 0.083% nebulizer solution Commonly known as: PROVENTIL TAKE 3 MLS (2.5 MG TOTAL) BY NEBULIZATION EVERY 6 (SIX) HOURS AS NEEDED FOR SHORTNESS OF BREATH.   vitamin C 1000 MG tablet Take 1,000 mg by mouth daily.        Allergies  Allergen Reactions   Atorvastatin Itching and Swelling    Facial, tongue swelling   Shellfish Allergy Anaphylaxis   Colchicine Hives     Other Procedures/Studies: ECHOCARDIOGRAM COMPLETE  Result Date: 05/11/2022    ECHOCARDIOGRAM REPORT   Patient Name:   MADHAV MOHON Date of Exam: 05/11/2022 Medical Rec #:  [id number]   Height:       66.0 in Accession #:    [id number]  Weight:       213.3 lb Date of Birth:  [birth year]   BSA:          2.055 m Patient Age:    78 years    BP:           132/90 mmHg Patient Gender: M           HR:           87 bpm. Exam Location:  Inpatient Procedure: 2D Echo, Cardiac Doppler and Color Doppler Indications:    Acute Respiratory Distress  History:        Patient has prior history of Echocardiogram examinations, most                 recent 07/21/2016. Pulmonary HTN and COPD; Risk                 Factors:Hypertension, Current Smoker and Dyslipidemia. CKD,                 stage II.  Sonographer:    Ronny Flurry Referring Phys: [place]  Sonographer Comments: No parasternal window and  suboptimal apical window. Image acquisition challenging due to uncooperative  patient and Image acquisition challenging due to respiratory motion. IMPRESSIONS  1. Left ventricular ejection fraction, by estimation, is 60 to 65%. The left ventricle has normal function. The left ventricle has no regional wall motion abnormalities. Left ventricular diastolic function could not be evaluated.  2. Right ventricular systolic function is normal. The right ventricular size is normal. There is mildly elevated pulmonary artery systolic pressure.  3. The mitral valve is normal in structure. No evidence of mitral valve regurgitation.  4. The aortic valve is tricuspid. Aortic valve regurgitation is not visualized.  5. The inferior vena cava is normal in size with greater than 50% respiratory variability, suggesting right atrial pressure of 3 mmHg. FINDINGS  Left Ventricle: Left ventricular ejection fraction, by estimation, is 60 to 65%. The left ventricle has normal function. The left ventricle has no regional wall motion abnormalities. The left ventricular internal cavity size was normal in size. Suboptimal image quality limits for assessment of left ventricular hypertrophy. Left ventricular diastolic function could not be evaluated. Right Ventricle: The right ventricular size is normal. No increase in right ventricular wall thickness. Right ventricular systolic function is normal. There is mildly elevated pulmonary artery systolic pressure. The tricuspid regurgitant velocity is 2.97  m/s, and with an assumed right atrial pressure of 5 mmHg, the estimated right ventricular systolic pressure is 03.5 mmHg. Left Atrium: Left atrial size was normal in size. Right Atrium: Right atrial size was normal in size. Pericardium: There is no evidence of pericardial effusion. Mitral Valve: The mitral valve is normal in structure. No evidence of mitral valve regurgitation. Tricuspid Valve: The tricuspid valve is normal in structure. Tricuspid  valve regurgitation is trivial. Aortic Valve: The aortic valve is tricuspid. Aortic valve regurgitation is not visualized. Aortic valve mean gradient measures 4.0 mmHg. Aortic valve peak gradient measures 8.3 mmHg. Pulmonic Valve: The pulmonic valve was not well visualized. Pulmonic valve regurgitation is not visualized. Aorta: The aortic root was not well visualized and the ascending aorta was not well visualized. Venous: The inferior vena cava is normal in size with greater than 50% respiratory variability, suggesting right atrial pressure of 3 mmHg. IAS/Shunts: The interatrial septum was not assessed.  AORTIC VALVE AV Vmax:           144.00 cm/s AV Vmean:          94.100 cm/s AV VTI:            0.237 m AV Peak Grad:      8.3 mmHg AV Mean Grad:      4.0 mmHg LVOT Vmax:         126.00 cm/s LVOT Vmean:        76.600 cm/s LVOT VTI:          0.190 m LVOT/AV VTI ratio: 0.80 TRICUSPID VALVE TR Peak grad:   35.3 mmHg TR Vmax:        297.00 cm/s  SHUNTS Systemic VTI: 0.19 m Glori Bickers MD Electronically signed by Glori Bickers MD Signature Date/Time: 05/11/2022/6:16:52 PM    Final    DG Chest Port 1 View  Result Date: 05/10/2022 CLINICAL DATA:  Shortness of breath. EXAM: PORTABLE CHEST 1 VIEW COMPARISON:  11/08/2021 and CT chest 10/30/2018. FINDINGS: Trachea is midline. Heart size stable. Lungs are emphysematous. Mild bibasilar ill-defined airspace opacification. No definite pleural fluid. IMPRESSION: 1. Bibasilar airspace opacification may be due to atelectasis superimposed on scarring. Difficult to exclude pneumonia. 2.  Emphysema (ICD10-J43.9). Electronically Signed   By: Lorin Picket  M.D.   On: 05/10/2022 10:36     TODAY-DAY OF DISCHARGE:  Subjective:   [name] today has no headache,no chest abdominal pain,no new weakness tingling or numbness, feels much better wants to go home today.  Objective:   Blood pressure (!) 135/94, pulse (!) 101, temperature 99 F (37.2 C), temperature source  Oral, resp. rate 20, height '5\' 6"'$  (1.676 m), weight 96.8 kg, SpO2 99 %.  Intake/Output Summary (Last 24 hours) at 05/14/2022 0920 Last data filed at 05/14/2022 1062 Gross per 24 hour  Intake 716 ml  Output 1400 ml  Net -684 ml   Filed Weights   05/10/22 0939 05/11/22 0622 05/14/22 0618  Weight: 99.8 kg 96.8 kg 96.8 kg    Exam: Awake Alert, Oriented *3, No new F.N deficits, Normal affect [place].AT,PERRAL Supple Neck,No JVD, No cervical lymphadenopathy appriciated.  Symmetrical Chest wall movement, Good air movement bilaterally, CTAB RRR,No Gallops,Rubs or new Murmurs, No Parasternal Heave +ve B.Sounds, Abd Soft, Non tender, No organomegaly appriciated, No rebound -guarding or rigidity. No Cyanosis, Clubbing or edema, No new Rash or bruise   PERTINENT RADIOLOGIC STUDIES: No results found.   PERTINENT LAB RESULTS: CBC: Recent Labs    05/13/22 0143 05/14/22 0136  WBC 21.4* 24.7*  HGB 13.5 13.7  HCT 43.4 44.8  PLT 274 262   CMET CMP     Component Value Date/Time   NA 132 (L) 05/14/2022 0136   NA 143 03/14/2022 1420   K 3.6 05/14/2022 0136   CL 92 (L) 05/14/2022 0136   CO2 30 05/14/2022 0136   GLUCOSE 99 05/14/2022 0136   BUN 33 (H) 05/14/2022 0136   BUN 8 03/14/2022 1420   CREATININE 1.41 (H) 05/14/2022 0136   CREATININE 1.50 (H) 09/17/2015 0955   CALCIUM 8.7 (L) 05/14/2022 0136   PROT 6.9 05/13/2022 0143   PROT 7.3 03/14/2022 1420   ALBUMIN 3.2 (L) 05/13/2022 0143   ALBUMIN 4.4 03/14/2022 1420   AST 29 05/13/2022 0143   ALT 25 05/13/2022 0143   ALKPHOS 65 05/13/2022 0143   BILITOT 0.6 05/13/2022 0143   BILITOT 0.5 03/14/2022 1420   GFRNONAA 56 (L) 05/14/2022 0136   GFRNONAA 51 (L) 09/17/2015 0955   GFRAA >60 10/30/2018 0703   GFRAA 59 (L) 09/17/2015 0955    GFR Estimated Creatinine Clearance: 58.4 mL/min (A) (by C-G formula based on SCr of 1.41 mg/dL (H)). No results for input(s): "LIPASE", "AMYLASE" in the last 72 hours. No results for input(s): "CKTOTAL",  "CKMB", "CKMBINDEX", "TROPONINI" in the last 72 hours. Invalid input(s): "POCBNP" Recent Labs    05/12/22 0351  DDIMER <0.27   No results for input(s): "HGBA1C" in the last 72 hours. No results for input(s): "CHOL", "HDL", "LDLCALC", "TRIG", "CHOLHDL", "LDLDIRECT" in the last 72 hours. No results for input(s): "TSH", "T4TOTAL", "T3FREE", "THYROIDAB" in the last 72 hours.  Invalid input(s): "FREET3" No results for input(s): "VITAMINB12", "FOLATE", "FERRITIN", "TIBC", "IRON", "RETICCTPCT" in the last 72 hours. Coags: No results for input(s): "INR" in the last 72 hours.  Invalid input(s): "PT" Microbiology: Recent Results (from the past 240 hour(s))  Resp panel by RT-PCR (RSV, Flu A&B, Covid) Anterior Nasal Swab     Status: Abnormal   Collection Time: 05/10/22 10:46 AM   Specimen: Anterior Nasal Swab  Result Value Ref Range Status   SARS Coronavirus 2 by RT PCR POSITIVE (A) NEGATIVE Final   Influenza A by PCR NEGATIVE NEGATIVE Final   Influenza B by PCR NEGATIVE NEGATIVE Final  Comment: (NOTE) The Xpert Xpress SARS-CoV-2/FLU/RSV plus assay is intended as an aid in the diagnosis of influenza from Nasopharyngeal swab specimens and should not be used as a sole basis for treatment. Nasal washings and aspirates are unacceptable for Xpert Xpress SARS-CoV-2/FLU/RSV testing.  Fact Sheet for Patients: EntrepreneurPulse.com.au  Fact Sheet for Healthcare Providers: IncredibleEmployment.be  This test is not yet approved or cleared by the [place] FDA and has been authorized for detection and/or diagnosis of SARS-CoV-2 by FDA under an Emergency Use Authorization (EUA). This EUA will remain in effect (meaning this test can be used) for the duration of the COVID-19 declaration under Section 564(b)(1) of the Act, 21 U.S.C. section 360bbb-3(b)(1), unless the authorization is terminated or revoked.     Resp Syncytial Virus by PCR NEGATIVE NEGATIVE  Final    Comment: (NOTE) Fact Sheet for Patients: EntrepreneurPulse.com.au  Fact Sheet for Healthcare Providers: IncredibleEmployment.be  This test is not yet approved or cleared by the [place] FDA and has been authorized for detection and/or diagnosis of SARS-CoV-2 by FDA under an Emergency Use Authorization (EUA). This EUA will remain in effect (meaning this test can be used) for the duration of the COVID-19 declaration under Section 564(b)(1) of the Act, 21 U.S.C. section 360bbb-3(b)(1), unless the authorization is terminated or revoked.  Performed at [facility], [place] [address] [place], [place] [address]     FURTHER DISCHARGE INSTRUCTIONS:  Get Medicines reviewed and adjusted: Please take all your medications with you for your next visit with your Primary MD  Laboratory/radiological data: Please request your Primary MD to go over all hospital tests and procedure/radiological results at the follow up, please ask your Primary MD to get all Hospital records sent to his/her office.  In some cases, they will be blood work, cultures and biopsy results pending at the time of your discharge. Please request that your primary care M.D. goes through all the records of your hospital data and follows up on these results.  Also Note the following: If you experience worsening of your admission symptoms, develop shortness of breath, life threatening emergency, suicidal or homicidal thoughts you must seek medical attention immediately by calling 911 or calling your MD immediately  if symptoms less severe.  You must read complete instructions/literature along with all the possible adverse reactions/side effects for all the Medicines you take and that have been prescribed to you. Take any new Medicines after you have completely understood and accpet all the possible adverse reactions/side effects.   Do not drive when taking Pain medications or  sleeping medications (Benzodaizepines)  Do not take more than prescribed Pain, Sleep and Anxiety Medications. It is not advisable to combine anxiety,sleep and pain medications without talking with your primary care practitioner  Special Instructions: If you have smoked or chewed Tobacco  in the last 2 yrs please stop smoking, stop any regular Alcohol  and or any Recreational drug use.  Wear Seat belts while driving.  Please note: You were cared for by a hospitalist during your hospital stay. Once you are discharged, your primary care physician will handle any further medical issues. Please note that NO REFILLS for any discharge medications will be authorized once you are discharged, as it is imperative that you return to your primary care physician (or establish a relationship with a primary care physician if you do not have one) for your post hospital discharge needs so that they can reassess your need for medications and monitor your lab values.  Total Time spent coordinating discharge including counseling, education and face to face time equals greater than 30 minutes.  Signed: Oren Binet 05/14/2022 9:20 AM

## 2022-05-14 NOTE — Plan of Care (Signed)

## 2022-05-15 ENCOUNTER — Telehealth: Payer: Self-pay

## 2022-05-15 NOTE — Telephone Encounter (Signed)
From the discharge call:  He stated he is doing a lot better than he was. He is wearing his mask when around family members. He also said that he understands how important it is to wash his hands frequently.   he said he has all of his medications and is taking them as ordered. He did not have any questions about the med regime. He has a working nebulizer.  He ambulates with a RW.and is independent with personal care, ALDs.  He also stated that he manages his own medications but his family will check to make sure he takes them.  He uses O2 @ 6 L continuously but will increase to 8L when he is ambulating.   Yes  Scheduled to see [name] [name] 05/25/2022. I gave him the address and phone number for [facility] and instructed him to call if he needs to change the appointment.  [name] [name] did not have any appointments available until March.

## 2022-05-15 NOTE — Telephone Encounter (Signed)
Transition Care Management Unsuccessful Follow-up Telephone Call  Date of discharge and from where:   05/14/2022, [facility]   Attempts:  1st Attempt  Reason for unsuccessful TCM follow-up call:  Left voice message- [phone] , call back requested  Need to schedule a hospital follow up appointment.

## 2022-05-15 NOTE — Telephone Encounter (Signed)
Transition Care Management Follow-up Telephone Call  The patient returned my call Date of discharge and from where: 05/14/2022, [facility]  How have you been since you were released from the hospital? He stated he is doing a lot better than he was. He is wearing his mask when around family members. He also said that he understands how important it is to wash his hands frequently.  Any questions or concerns? No  Items Reviewed: Did the pt receive and understand the discharge instructions provided? Yes  Medications obtained and verified? Yes - he said he has all of his medications and is taking them as ordered. He did not have any questions about the med regime. He has a working nebulizer. Other? No  Any new allergies since your discharge? No  Dietary orders reviewed? No Do you have support at home? Yes - family  Home Care and Equipment/Supplies: Were home health services ordered? no If so, what is the name of the agency? N/a  Has the agency set up a time to come to the patient's home? not applicable Were any new equipment or medical supplies ordered?  No What is the name of the medical supply agency? N/a Were you able to get the supplies/equipment? not applicable Do you have any questions related to the use of the equipment or supplies? No  Functional Questionnaire: (I = Independent and D = Dependent) ADLs: He ambulates with a RW.and is independent with personal care, ALDs.  He also stated that he manages his own medications but his family will check to make sure he takes them.  He uses O2 @ 6 L continuously but will increase to 8L when he is ambulating.   Follow up appointments reviewed:  PCP Hospital f/u appt confirmed? Yes  Scheduled to see Genice Rouge 05/25/2022. I gave him the address and phone number for [facility] and instructed him to call if he needs to change the appointment.  Dr Margarita Rana did not have any appointments available until March.  [facility] f/u appt  confirmed?  None scheduled at this time    Are transportation arrangements needed? No - he relies on his daughter for transportation.  He does not want to use transportation provided by his insurance company.  If their condition worsens, is the pt aware to call PCP or go to the Emergency Dept.? Yes Was the patient provided with contact information for the PCP's office or ED? Yes Was to pt encouraged to call back with questions or concerns? Yes

## 2022-05-22 ENCOUNTER — Other Ambulatory Visit: Payer: Self-pay

## 2022-05-22 ENCOUNTER — Other Ambulatory Visit: Payer: Self-pay | Admitting: Family Medicine

## 2022-05-23 ENCOUNTER — Other Ambulatory Visit: Payer: Self-pay

## 2022-05-23 MED ORDER — ALBUTEROL SULFATE HFA 108 (90 BASE) MCG/ACT IN AERS
2.0000 | INHALATION_SPRAY | Freq: Four times a day (QID) | RESPIRATORY_TRACT | 5 refills | Status: DC | PRN
  Filled 2022-05-23: qty 18, 25d supply, fill #0
  Filled 2022-06-22: qty 18, 25d supply, fill #1
  Filled 2022-07-25 (×2): qty 18, 25d supply, fill #2
  Filled 2022-08-25: qty 18, 25d supply, fill #3
  Filled 2022-09-29 – 2022-10-30 (×2): qty 18, 25d supply, fill #4
  Filled 2022-12-05: qty 18, 25d supply, fill #5

## 2022-05-23 NOTE — Telephone Encounter (Signed)
Requested medication (s) are due for refill today: yes  Requested medication (s) are on the active medication list: yes  Last refill:  12/05/21  Future visit scheduled: yes  Notes to clinic:  Unable to refill per protocol, last refill by another provider.      Requested Prescriptions  Pending Prescriptions Disp Refills   albuterol (VENTOLIN HFA) 108 (90 Base) MCG/ACT inhaler 18 g 5    Sig: inhale 2 pufss every 6 hours as needed for wheezing or shortness of breath     There is no refill protocol information for this order

## 2022-05-24 ENCOUNTER — Other Ambulatory Visit: Payer: Self-pay

## 2022-05-25 ENCOUNTER — Ambulatory Visit (INDEPENDENT_AMBULATORY_CARE_PROVIDER_SITE_OTHER): Payer: Medicaid Other | Admitting: Primary Care

## 2022-05-25 ENCOUNTER — Encounter (INDEPENDENT_AMBULATORY_CARE_PROVIDER_SITE_OTHER): Payer: Self-pay | Admitting: Primary Care

## 2022-05-25 VITALS — BP 122/78 | HR 80 | Resp 16 | Wt 218.8 lb

## 2022-05-25 DIAGNOSIS — I10 Essential (primary) hypertension: Secondary | ICD-10-CM

## 2022-05-25 DIAGNOSIS — Z09 Encounter for follow-up examination after completed treatment for conditions other than malignant neoplasm: Secondary | ICD-10-CM

## 2022-05-25 DIAGNOSIS — J9611 Chronic respiratory failure with hypoxia: Secondary | ICD-10-CM | POA: Diagnosis not present

## 2022-05-25 NOTE — Progress Notes (Signed)
[facility]   Subjective:   [name] is a 62 y.o. male presents for hospital follow up . Primary care provider Dr. Margarita [name]- no hospital f/u avail. 05/10/22 presented to ED via ambulance with  reports worsening shortness of breath which was  gradual worsening . Admits to trying home breathing treatments without improvement. He uses 6 liters of O2 at home followed by pulmonary.  Respiratory panel for flu COVID and RSV positive for COVID.  Chest x-ray  representing atelectasis versus scarring versus pneumonia, emphysema noted.  Patient received Solu-Medrol, albuterol, DuoNeb in the ED. , patient was discharged from the hospital on 05/14/22, patient was admitted for:   Acute on chronic respiratory failure with hypoxia ,CKD (chronic kidney disease) stage 2, GFR 60-89 ml/min, Gout, COPD with acute exacerbation, Hypertension, Moderate to severe pulmonary hypertension ,Class 2 obesity,  Hyperlipidemia and COVID-19 virus infection. Today , he is praising God for the healing hand he placed on him. Patient has No headache, No chest pain, No abdominal pain - No Nausea, No new weakness tingling or numbness, No Cough. Chronic shortness of breath on continuous oxygen and COPD. No c/o  problems or concerns. Past Medical History:  Diagnosis Date   Asthma 04/10/1997   CKD (chronic kidney disease), 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   COPD (chronic obstructive pulmonary disease) (HCC)    on 4L home O2   Essential hypertension    Gout    Thrombocytopenia (HCC) 07/28/2012     Allergies  Allergen Reactions   Atorvastatin Itching and Swelling    Facial, tongue swelling   Shellfish Allergy Anaphylaxis   Colchicine Hives      Current Outpatient Medications on File Prior to Visit  Medication Sig Dispense Refill   albuterol (PROVENTIL) (2.5 MG/3ML) 0.083% nebulizer solution TAKE 3 MLS (2.5 MG TOTAL) BY NEBULIZATION EVERY 6 (SIX) HOURS AS NEEDED FOR SHORTNESS OF BREATH. 360 mL 1   albuterol (VENTOLIN HFA) 108  (90 Base) MCG/ACT inhaler Inhale 2 puffs into the lungs every 6 (six) hours as needed for wheezing or shortness of breath 18 g 5   allopurinol (ZYLOPRIM) 100 MG tablet Take 1 tablet (100 mg total) by mouth daily. 90 tablet 1   amLODipine (NORVASC) 10 MG tablet Take 1 tablet (10 mg total) by mouth daily. 90 tablet 1   Apremilast (OTEZLA) 30 MG TABS Take 30 mg by mouth 2 (two) times daily. (Patient not taking: Reported on 05/10/2022)     Ascorbic Acid (VITAMIN C) 1000 MG tablet Take 1,000 mg by mouth daily.     benzonatate (TESSALON PERLES) 100 MG capsule Take 1 capsule (100 mg total) by mouth 3 (three) times daily as needed for cough. 30 capsule 0   budesonide-formoterol (SYMBICORT) 160-4.5 MCG/ACT inhaler INHALE 2 PUFFS INTO THE LUNGS TWICE DAILY. 10.2 g 5   diphenhydrAMINE (BENADRYL) 25 MG tablet Take 50 mg by mouth daily as needed (allergies).      ezetimibe (ZETIA) 10 MG tablet Take 1 tablet (10 mg total) by mouth daily. 30 tablet 3   guaiFENesin-dextromethorphan (ROBITUSSIN DM) 100-10 MG/5ML syrup Take 10 mLs by mouth every 4 (four) hours as needed for cough. 118 mL 0   hydrocortisone 1 % ointment Apply 1 application topically 2 (two) times daily. 30 g 0   ibuprofen (ADVIL) 200 MG tablet Take 800 mg by mouth daily as needed (gout swelling).     metoprolol succinate (TOPROL-XL) 50 MG 24 hr tablet Take 1 tablet (50 mg total) by mouth daily.  90 tablet 1   predniSONE (DELTASONE) 10 MG tablet Take 40 mg daily for 1 day, 30 mg daily for 1 day, 20 mg daily for 1 days,10 mg daily for 1 day, then stop 10 tablet 0   tiotropium (SPIRIVA HANDIHALER) 18 MCG inhalation capsule PLACE 1 CAPSULE (18 MCG TOTAL) INTO INHALER AND INHALE DAILY. 30 capsule 11   vitamin B-12 (CYANOCOBALAMIN) 500 MCG tablet Take 500 mcg by mouth daily.     [DISCONTINUED] ALBUTEROL IN Inhale into the lungs.     No current facility-administered medications on file prior to visit.     Review of System: Comprehensive ROS Pertinent  positive and negative noted in HPI    Objective:  Blood Pressure 122/78   Pulse 80   Respiration 16   Weight 218 lb 12.8 oz (99.2 kg)   Oxygen Saturation 99%   Body Mass Index 35.32 kg/m   Filed Weights   05/25/22 0939  Weight: 218 lb 12.8 oz (99.2 kg)    Physical Exam: General Appearance: Well nourished, in no apparent distress. Eyes:EOMs, conjunctiva no swelling  Sinuses: No Frontal/maxillary tenderness ENT/Mouth: Ext aud canals clear, TMs without erythema, bulging.  Hearing normal.  Neck: Supple, thyroid normal.  Respiratory: Respiratory effort normal, BS equal bilaterally without rales, rhonchi, wheezing or stridor.  Cardio: RRR with no MRGs.Bilateral LE edema +2 Abdomen: Soft, + BS.  Non tender, no guarding, rebound, hernias, masses. Lymphatics: Non tender without lymphadenopathy.  Musculoskeletal: Full ROM, 5/5 strength, normal gait.  Skin: Warm, dry without rashes, lesions, ecchymosis.  Neuro: Cranial nerves intact. Normal muscle tone, no cerebellar symptoms. Sensation intact.  Psych: Awake and oriented X 3, normal affect, Insight and Judgment appropriate.    Assessment:  [name] [name] was seen today for hospitalization follow-up.  Diagnoses and all orders for this visit:  Hospital discharge follow-up Requested orders and scheduled for f/u with pulmonologist  -     CBC with Differential -     CMP14+EGFR  Essential hypertension BP goal - < 140/90 met Explained that having normal blood pressure is the goal and medications are helping to get to goal and maintain normal blood pressure. DIET: Limit salt intake, read nutrition labels to check salt content, limit fried and high fatty foods  Avoid using multisymptom OTC cold preparations that generally contain sudafed which can rise BP. Consult with pharmacist on best cold relief products to use for persons with HTN EXERCISE Discussed incorporating exercise such as walking - 30 minutes most days of the week and can do in 10  minute intervals     Chronic respiratory failure with hypoxia ([place]) On O2 24/7 followed by [name] Garfinkel, MD     This note has been created with Dragon speech recognition software and Engineer, materials. Any transcriptional errors are unintentional.   Kerin Perna, NP 05/25/2022, 10:04 AM

## 2022-05-26 LAB — CMP14+EGFR
ALT: 32 IU/L (ref 0–44)
AST: 19 IU/L (ref 0–40)
Albumin/Globulin Ratio: 1.4 (ref 1.2–2.2)
Albumin: 3.7 g/dL — ABNORMAL LOW (ref 3.9–4.9)
Alkaline Phosphatase: 69 IU/L (ref 44–121)
BUN/Creatinine Ratio: 7 — ABNORMAL LOW (ref 10–24)
BUN: 8 mg/dL (ref 8–27)
Bilirubin Total: 0.9 mg/dL (ref 0.0–1.2)
CO2: 34 mmol/L — ABNORMAL HIGH (ref 20–29)
Calcium: 9.5 mg/dL (ref 8.6–10.2)
Chloride: 97 mmol/L (ref 96–106)
Creatinine, Ser: 1.12 mg/dL (ref 0.76–1.27)
Globulin, Total: 2.7 g/dL (ref 1.5–4.5)
Glucose: 75 mg/dL (ref 70–99)
Potassium: 4.6 mmol/L (ref 3.5–5.2)
Sodium: 141 mmol/L (ref 134–144)
Total Protein: 6.4 g/dL (ref 6.0–8.5)
eGFR: 74 mL/min/{1.73_m2} (ref >59)

## 2022-05-26 LAB — CBC WITH DIFFERENTIAL/PLATELET
Basophils Absolute: 0 10*3/uL (ref 0.0–0.2)
Basos: 0 %
EOS (ABSOLUTE): 0.3 10*3/uL (ref 0.0–0.4)
Eos: 2 %
Hematocrit: 40.5 % (ref 37.5–51.0)
Hemoglobin: 12.6 g/dL — ABNORMAL LOW (ref 13.0–17.7)
Immature Grans (Abs): 0.1 10*3/uL (ref 0.0–0.1)
Immature Granulocytes: 1 %
Lymphocytes Absolute: 2.2 10*3/uL (ref 0.7–3.1)
Lymphs: 21 %
MCH: 24.3 pg — ABNORMAL LOW (ref 26.6–33.0)
MCHC: 31.1 g/dL — ABNORMAL LOW (ref 31.5–35.7)
MCV: 78 fL — ABNORMAL LOW (ref 79–97)
Monocytes Absolute: 0.9 10*3/uL (ref 0.1–0.9)
Monocytes: 8 %
Neutrophils Absolute: 7.5 10*3/uL — ABNORMAL HIGH (ref 1.4–7.0)
Neutrophils: 68 %
Platelets: 235 10*3/uL (ref 150–450)
RBC: 5.18 x10E6/uL (ref 4.14–5.80)
RDW: 12.2 % (ref 11.6–15.4)
WBC: 11 10*3/uL — ABNORMAL HIGH (ref 3.4–10.8)

## 2022-05-31 ENCOUNTER — Encounter: Payer: Self-pay | Admitting: Pulmonary Disease

## 2022-05-31 ENCOUNTER — Telehealth (INDEPENDENT_AMBULATORY_CARE_PROVIDER_SITE_OTHER): Payer: Medicaid Other | Admitting: Pulmonary Disease

## 2022-05-31 DIAGNOSIS — J441 Chronic obstructive pulmonary disease with (acute) exacerbation: Secondary | ICD-10-CM

## 2022-05-31 DIAGNOSIS — U099 Post covid-19 condition, unspecified: Secondary | ICD-10-CM | POA: Diagnosis not present

## 2022-05-31 DIAGNOSIS — I272 Pulmonary hypertension, unspecified: Secondary | ICD-10-CM

## 2022-05-31 NOTE — Progress Notes (Signed)
[name] [name]    BJ:[id number]    1960-09-28  Primary Care Physician:Newlin, Charlane Ferretti, MD  Referring Physician: Charlott Rakes, MD [address] [place],   [id number]  Virtual Visit via Video Note  I connected with [name] [name] on 05/31/22 at  2:30 PM EST by a video enabled telemedicine application and verified that I am speaking with the correct person using two identifiers.  Location: Patient: Home Provider: [address]   I discussed the limitations of evaluation and management by telemedicine and the availability of in person appointments. The patient expressed understanding and agreed to proceed.  Chief complaint:   Follow up for COPD GOLD B (Cat score 17, 0 exacerbations) Pulmonary HTN  HPI: [name] [name] is a 62 y.o.  with extensive smoking history. His main complaint is dyspnea on exertion for the past several years. He has chronic cough with mucus production. Denies any wheezing, hemoptysis. He was maintained initially on just albuterol rescue inhaler. He is maintained on spiriva. He feels very currently. He has dyspnea on exertion which is not different from baseline. Denies any cough, sputum production, hemoptysis, fever, weight loss.  He has over 25 years pack year smoking history. Quit in 2020   Interim history: Continues on Symbicort and Spiriva.  On supplemental oxygen Admitted to hospital from 1/31 to 05/14/2022 with COVID-19 pneumonia, COPD exacerbation, acute on chronic hypoxic respiratory failure.  Treated with steroids, bronchodilators.  He received 1 dose of Actemra on 05/10/2022.  Initially on high flow nasal cannula which was weaned to his usual home regimen of 6 L oxygen.  Also noted to have mild AKI, leukocytosis.  At home he feels improved and almost back to baseline He is finished his prednisone taper and is taking Best boy.  Continues on Symbicort and Spiriva.   Outpatient Encounter Medications as of 04/17/2019   Medication Sig   albuterol (PROVENTIL) (2.5 MG/3ML) 0.083% nebulizer solution INHALE 3 MLS (1 VIAL) EVERY 6 HOURS VIA NEBULIAZATION ROUTE AS NEEDED FOR SHORTNESS OF BREATH (Patient taking differently: Take 2.5 mg by nebulization every 6 (six) hours as needed for shortness of breath. )   albuterol (VENTOLIN HFA) 108 (90 Base) MCG/ACT inhaler INHALE 2 PUFFS INTO THE LUNGS EVERY 4 HOURS AS NEEDED FOR WHEEZING OR SHORTNESS OF BREATH.   allopurinol (ZYLOPRIM) 100 MG tablet Take 1 tablet (100 mg total) by mouth daily.   amLODipine (NORVASC) 10 MG tablet Take 1 tablet (10 mg total) by mouth daily.   ammonium lactate (LAC-HYDRIN) 12 % lotion Apply 1 application topically daily.   AZO-CRANBERRY PO Take 2 tablets by mouth every 30 (thirty) days.   budesonide-formoterol (SYMBICORT) 160-4.5 MCG/ACT inhaler INAHLE 2 PUFFS INTO THE LUNGS TWICE DAILY.   Cyanocobalamin (VITAMIN B-12) 1000 MCG SUBL Place 1 tablet (1,000 mcg total) under the tongue daily.   cyclobenzaprine (FLEXERIL) 5 MG tablet Take 1 tablet (5 mg total) by mouth 2 (two) times daily as needed for muscle spasms.   diphenhydrAMINE (BENADRYL ALLERGY) 25 MG tablet Take 50 mg by mouth daily as needed (allergies).    ezetimibe (ZETIA) 10 MG tablet Take 1 tablet (10 mg total) by mouth daily.   gabapentin (NEURONTIN) 300 MG capsule Take 1 capsule (300 mg total) by mouth at bedtime.   hydrocortisone 1 % ointment Apply 1 application topically 2 (two) times daily.   losartan (COZAAR) 50 MG tablet Take 1 tablet (50 mg total) by mouth daily. Needs PCP  appointment.   metoprolol succinate (TOPROL-XL) 50 MG 24 hr tablet Take 1 tablet (50 mg total) by mouth daily.   tiotropium (SPIRIVA HANDIHALER) 18 MCG inhalation capsule Place 1 capsule (18 mcg total) into inhaler and inhale daily.   triamcinolone ointment (KENALOG) 0.1 % APPLY 1 APPLICATION TOPICALLY 2 TIMES DAILY. (Patient taking differently: Apply 1 application topically 2 (two) times daily. )    [DISCONTINUED] ALBUTEROL IN Inhale into the lungs.   No facility-administered encounter medications on file as of 04/17/2019.   Physical Exam: Video visitg  Data Reviewed: Imaging CTA scan 12/13/13-no PE, mild airway thickening, atelectasis, cardiomegaly   CT chest 10/30/2018-emphysema in the upper lobes.  Mild lower lobe atelectasis.  Chest x-ray 05/10/2022-bibasilar opacification, atelectasis, emphysema I have reviewed the images personally.  PFTs. 07/22/15 2.19 [60%], FEV1 1.07 [37%],/49, DLCO 10.99 [38%] Severe obstruction with severe diffusion impairment   Cardiac Echo 10/27/14 Normal LV function; mild LVH and LVE; grade 1 diastolic dysfunction; moderate LAE; mild RAE; trace TR; severely elevated pulmonary pressure. PAP 72  Echo 07/21/2016 LVEF 123456, grade 1 diastolic dysfunction, PAP 42  Sleep study 06/15/15 No significant obstructive sleep apnea, moderate O2 desaturation  Labs Alpha-1 antitrypsin 06/16/2016-137, PI MM HIV 07/06/2016-negative SSB 7.9, double-stranded DNA, SSA, rheumatoid factor-negative Hepatic function panel 07/17/2017-negative  Assessment:  Recent admission for COVID-19 pneumonia, COPD exacerbation Appears to be improving back to baseline  Severe COPD Continue Symbicort, Spiriva, rescue inhaler  Pulmonary hypertension. 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  Continue supplemental oxygen Repeat echo in 2018 shows improvement in PA pressures Work-up for alternative PAH etiologies including HIV, LFTs, ANA, RF, CCP is negative.  He has mild elevation in SSB which is likely nonspecific. We will continue to monitor  Heavy ex-smoker. We had referred for low-dose screening CT of the chest but he does not want to leave home due to fear of COVID-19 infection.  We had an extensive discussion today and he does not want to know if he has any cancer and does not want to get treatment with radiation or chemotherapy even if  malignancy is detected  Health maintenance Prevnar 12/22/2013 Pneumovax 07/30/2012 He does not want to get the flu vaccine.  Plan/Recommendations: - Continue spiriva, symbicort - Continue supplemental O2  I discussed the assessment and treatment plan with the patient. The patient was provided an opportunity to ask questions and all were answered. The patient agreed with the plan and demonstrated an understanding of the instructions.   The patient was advised to call back or seek an in-person evaluation if the symptoms worsen or if the condition fails to improve as anticipated.  I provided 35 minutes of non-face-to-face time during this encounter.  [name] MD [facility] 05/31/2022, 2:06 PM  CC: [name] Rakes, MD

## 2022-06-13 ENCOUNTER — Telehealth: Payer: Self-pay | Admitting: *Deleted

## 2022-06-13 NOTE — Telephone Encounter (Addendum)
Pt contacted by phone today, 06/13/22, offering to pt to participate in a colon cancer screening test that could actually prevent him from getting colon cancer. Dr. Smitty Pluck office notes from  03/14/2022 noted pt "also declines lung cancer screening, colorectal cancer screening as he does not want to be made aware of any malignancy. " This caller explained to pt that just collecting a specimen could help him discover potential issues and then prevent them from becoming worse, including preventing them from becoming cancer if he would be interested in doing a specimen test kit that Dr. Smitty Pluck office could give him, covered by his insurance. Pt states "Alll right then, I will think about it before I go back to Dr. Smitty Pluck office." Letter sent to pt confirming his consent to consider doing a stool test for colon cancer before his next appt with Dr. Margarita Rana

## 2022-06-22 ENCOUNTER — Other Ambulatory Visit: Payer: Self-pay

## 2022-06-22 ENCOUNTER — Other Ambulatory Visit: Payer: Self-pay | Admitting: Pulmonary Disease

## 2022-06-22 ENCOUNTER — Other Ambulatory Visit: Payer: Self-pay | Admitting: Family Medicine

## 2022-06-22 DIAGNOSIS — J449 Chronic obstructive pulmonary disease, unspecified: Secondary | ICD-10-CM

## 2022-06-22 DIAGNOSIS — I10 Essential (primary) hypertension: Secondary | ICD-10-CM

## 2022-06-22 MED ORDER — BUDESONIDE-FORMOTEROL FUMARATE 160-4.5 MCG/ACT IN AERO
INHALATION_SPRAY | RESPIRATORY_TRACT | 5 refills | Status: DC
  Filled 2022-06-22: qty 10.2, 30d supply, fill #0
  Filled 2022-07-25 (×2): qty 10.2, 30d supply, fill #1
  Filled 2022-08-25: qty 10.2, 30d supply, fill #2
  Filled 2022-09-26 (×2): qty 10.2, 30d supply, fill #3
  Filled 2022-10-30: qty 10.2, 30d supply, fill #4
  Filled 2022-12-05: qty 10.2, 30d supply, fill #5

## 2022-06-26 ENCOUNTER — Other Ambulatory Visit: Payer: Self-pay

## 2022-07-25 ENCOUNTER — Other Ambulatory Visit: Payer: Self-pay | Admitting: Family Medicine

## 2022-07-25 ENCOUNTER — Other Ambulatory Visit: Payer: Self-pay

## 2022-07-25 DIAGNOSIS — E7800 Pure hypercholesterolemia, unspecified: Secondary | ICD-10-CM

## 2022-07-25 MED ORDER — EZETIMIBE 10 MG PO TABS
10.0000 mg | ORAL_TABLET | Freq: Every day | ORAL | 0 refills | Status: DC
  Filled 2022-07-25: qty 90, 90d supply, fill #0

## 2022-08-25 ENCOUNTER — Other Ambulatory Visit: Payer: Self-pay

## 2022-08-30 ENCOUNTER — Other Ambulatory Visit: Payer: Self-pay

## 2022-09-13 ENCOUNTER — Ambulatory Visit: Payer: Medicaid Other | Admitting: Family Medicine

## 2022-09-26 ENCOUNTER — Other Ambulatory Visit: Payer: Self-pay

## 2022-09-26 ENCOUNTER — Other Ambulatory Visit: Payer: Self-pay | Admitting: Family Medicine

## 2022-09-26 DIAGNOSIS — J449 Chronic obstructive pulmonary disease, unspecified: Secondary | ICD-10-CM

## 2022-09-26 MED ORDER — ALBUTEROL SULFATE (2.5 MG/3ML) 0.083% IN NEBU
INHALATION_SOLUTION | RESPIRATORY_TRACT | 0 refills | Status: DC
Start: 2022-09-26 — End: 2023-07-11
  Filled 2022-09-26: qty 360, 30d supply, fill #0

## 2022-09-26 NOTE — Telephone Encounter (Signed)
Requested medication (s) are due for refill today:yes  Requested medication (s) are on the active medication list:yes  Last refill:  03/27/22 360 ml 1 RF  Future visit scheduled:yes  Notes to clinic:  med not assigned to a protocol   Requested Prescriptions  Pending Prescriptions Disp Refills   albuterol (PROVENTIL) (2.5 MG/3ML) 0.083% nebulizer solution 360 mL 1    Sig: TAKE 3 MLS (2.5 MG TOTAL) BY NEBULIZATION EVERY 6 (SIX) HOURS AS NEEDED FOR SHORTNESS OF BREATH.     There is no refill protocol information for this order

## 2022-09-27 ENCOUNTER — Other Ambulatory Visit: Payer: Self-pay

## 2022-09-28 ENCOUNTER — Other Ambulatory Visit: Payer: Self-pay

## 2022-09-29 ENCOUNTER — Other Ambulatory Visit: Payer: Self-pay

## 2022-10-05 ENCOUNTER — Other Ambulatory Visit: Payer: Medicaid Other | Admitting: Obstetrics and Gynecology

## 2022-10-05 NOTE — Patient Instructions (Signed)
Hi Mr. Mousel, I am sorry we missed you today - as a part of your Medicaid benefit, you are eligible for care management and care coordination services at no cost or copay. I was unable to reach you by phone today but would be happy to help you with your health related needs. Please feel free to call me at [phone].  A member of the Managed Medicaid care management team will reach out to you again over the next 30 business  days.   Kathi Der RN, BSN [place]  [employer] - Managed Medicaid High Risk [phone]

## 2022-10-05 NOTE — Patient Outreach (Signed)
  Medicaid Managed Care   Unsuccessful Attempt Note   10/05/2022 Name: [name] [name] MRN: [id number] DOB: 12-Jul-1960  Referred by: Hoy Register, MD Reason for referral : High Risk Managed Medicaid (Unsuccessful telephone outreach)  An unsuccessful telephone outreach was attempted today. The patient was referred to the case management team for assistance with care management and care coordination.    Follow Up Plan: The Managed Medicaid care management team will reach out to the patient again over the next 30 business  days. and The  Patient has been provided with contact information for the Managed Medicaid care management team and has been advised to call with any health related questions or concerns.   Kathi Der RN, BSN [place]  [employer] - Managed Medicaid High Risk [phone]

## 2022-10-17 ENCOUNTER — Other Ambulatory Visit: Payer: Self-pay

## 2022-10-17 ENCOUNTER — Other Ambulatory Visit: Payer: Self-pay | Admitting: Family Medicine

## 2022-10-17 DIAGNOSIS — I10 Essential (primary) hypertension: Secondary | ICD-10-CM

## 2022-10-17 DIAGNOSIS — M1A9XX0 Chronic gout, unspecified, without tophus (tophi): Secondary | ICD-10-CM

## 2022-10-17 DIAGNOSIS — E7800 Pure hypercholesterolemia, unspecified: Secondary | ICD-10-CM

## 2022-10-17 MED ORDER — AMLODIPINE BESYLATE 10 MG PO TABS
10.0000 mg | ORAL_TABLET | Freq: Every day | ORAL | 0 refills | Status: DC
Start: 2022-10-17 — End: 2023-01-15
  Filled 2022-10-17: qty 90, 90d supply, fill #0

## 2022-10-17 MED ORDER — EZETIMIBE 10 MG PO TABS
10.0000 mg | ORAL_TABLET | Freq: Every day | ORAL | 0 refills | Status: DC
Start: 2022-10-17 — End: 2023-01-15
  Filled 2022-10-17: qty 90, 90d supply, fill #0

## 2022-10-17 MED ORDER — ALLOPURINOL 100 MG PO TABS
100.0000 mg | ORAL_TABLET | Freq: Every day | ORAL | 0 refills | Status: DC
Start: 2022-10-17 — End: 2023-01-15
  Filled 2022-10-17: qty 90, 90d supply, fill #0

## 2022-10-17 MED ORDER — METOPROLOL SUCCINATE ER 50 MG PO TB24
50.0000 mg | ORAL_TABLET | Freq: Every day | ORAL | 0 refills | Status: DC
Start: 2022-10-17 — End: 2023-01-15
  Filled 2022-10-17: qty 90, 90d supply, fill #0

## 2022-10-20 ENCOUNTER — Other Ambulatory Visit: Payer: Self-pay

## 2022-10-30 ENCOUNTER — Other Ambulatory Visit: Payer: Self-pay

## 2022-10-30 ENCOUNTER — Other Ambulatory Visit (HOSPITAL_COMMUNITY): Payer: Self-pay

## 2022-11-13 ENCOUNTER — Telehealth: Payer: Self-pay

## 2022-11-13 NOTE — Telephone Encounter (Signed)
..   Medicaid Managed Care   Unsuccessful Outreach Note  11/13/2022 Name: [name] [name] MRN: [id number] DOB: June 28, 1960  Referred by: Hoy Register, MD Reason for referral : Appointment   A second unsuccessful telephone outreach was attempted today. The patient was referred to the case management team for assistance with care management and care coordination.   Follow Up Plan: A HIPAA compliant phone message was left for the patient providing contact information and requesting a return call.  The care management team will reach out to the patient again over the next 7 days.   Weston Settle Care Guide  [facility] Managed  Care Guide [facility]  [phone]

## 2022-12-05 ENCOUNTER — Other Ambulatory Visit: Payer: Self-pay

## 2022-12-05 ENCOUNTER — Ambulatory Visit: Payer: Medicaid Other | Admitting: Family Medicine

## 2022-12-05 ENCOUNTER — Other Ambulatory Visit: Payer: Self-pay | Admitting: Pulmonary Disease

## 2022-12-05 ENCOUNTER — Telehealth: Payer: Self-pay | Admitting: Family Medicine

## 2022-12-05 DIAGNOSIS — J449 Chronic obstructive pulmonary disease, unspecified: Secondary | ICD-10-CM

## 2022-12-05 NOTE — Telephone Encounter (Signed)
Copied from CRM [id number]. Topic: Appointment Scheduling - Scheduling Inquiry for Clinic >> Dec 05, 2022 11:04 AM Lennox Pippins wrote: Patient has called to reschedule appointmemt and stated he needs a Monday or a Tuesday to reschedule this appointment. No appointments coming up for those dates until November. Please advise if patient can be worked in.  I have cancelled patients appointment today per patient request, please call back to reschedule @ #[phone]    Also patient stated he is waiting on an authorization to have his Symbicort filled, the pharmacy needs Dr Alvis Lemmings to authorize this. Patient is completely out and requesting to pick it up today. Patient advised it might not be refilled today.

## 2022-12-05 NOTE — Telephone Encounter (Signed)
Pt has been called and informed to reach out to his pulmonologist for refill.

## 2022-12-06 ENCOUNTER — Other Ambulatory Visit: Payer: Self-pay | Admitting: Pulmonary Disease

## 2022-12-06 ENCOUNTER — Other Ambulatory Visit: Payer: Self-pay

## 2022-12-06 DIAGNOSIS — J449 Chronic obstructive pulmonary disease, unspecified: Secondary | ICD-10-CM

## 2022-12-08 ENCOUNTER — Other Ambulatory Visit: Payer: Self-pay | Admitting: Family Medicine

## 2022-12-08 ENCOUNTER — Telehealth: Payer: Self-pay | Admitting: Pulmonary Disease

## 2022-12-08 ENCOUNTER — Other Ambulatory Visit: Payer: Self-pay

## 2022-12-08 ENCOUNTER — Other Ambulatory Visit: Payer: Self-pay | Admitting: Pulmonary Disease

## 2022-12-08 DIAGNOSIS — J449 Chronic obstructive pulmonary disease, unspecified: Secondary | ICD-10-CM

## 2022-12-08 MED ORDER — TIOTROPIUM BROMIDE MONOHYDRATE 18 MCG IN CAPS
1.0000 | ORAL_CAPSULE | Freq: Every day | RESPIRATORY_TRACT | 0 refills | Status: DC
Start: 2022-12-08 — End: 2022-12-08
  Filled 2022-12-08: qty 30, 30d supply, fill #0

## 2022-12-08 MED ORDER — TIOTROPIUM BROMIDE MONOHYDRATE 18 MCG IN CAPS
1.0000 | ORAL_CAPSULE | Freq: Every day | RESPIRATORY_TRACT | 0 refills | Status: DC
Start: 2022-12-08 — End: 2022-12-26
  Filled 2022-12-08: qty 30, 30d supply, fill #0

## 2022-12-08 NOTE — Telephone Encounter (Signed)
Patient needs a refill of his spiriva sent to his pharmacy. He's out  Pharmacy: [facility].

## 2022-12-08 NOTE — Telephone Encounter (Signed)
Spiriva refill sent in and patient informed over the telephone. Nothing further needed

## 2022-12-08 NOTE — Telephone Encounter (Signed)
Requested medications are due for refill today.  yes  Requested medications are on the active medications list.  yes  Last refill. 11/22/2021 #30 1 rf  Future visit scheduled.   yes  Notes to clinic.  Rx written to expire 11/30/2022 - rx is expired. Rx signed by Chilton Greathouse    Requested Prescriptions  Pending Prescriptions Disp Refills   tiotropium (SPIRIVA HANDIHALER) 18 MCG inhalation capsule 30 capsule 11    Sig: PLACE 1 CAPSULE (18 MCG TOTAL) INTO INHALER AND INHALE DAILY.     There is no refill protocol information for this order

## 2022-12-20 ENCOUNTER — Other Ambulatory Visit: Payer: Self-pay | Admitting: Family Medicine

## 2022-12-20 ENCOUNTER — Other Ambulatory Visit: Payer: Self-pay

## 2022-12-20 DIAGNOSIS — G5623 Lesion of ulnar nerve, bilateral upper limbs: Secondary | ICD-10-CM

## 2022-12-21 DIAGNOSIS — J449 Chronic obstructive pulmonary disease, unspecified: Secondary | ICD-10-CM | POA: Diagnosis not present

## 2022-12-22 ENCOUNTER — Other Ambulatory Visit: Payer: Self-pay

## 2022-12-26 ENCOUNTER — Other Ambulatory Visit: Payer: Self-pay

## 2022-12-26 ENCOUNTER — Ambulatory Visit: Payer: Medicaid Other | Admitting: Family Medicine

## 2022-12-26 ENCOUNTER — Other Ambulatory Visit: Payer: Self-pay | Admitting: Pulmonary Disease

## 2022-12-26 ENCOUNTER — Other Ambulatory Visit: Payer: Self-pay | Admitting: Family Medicine

## 2022-12-26 DIAGNOSIS — J449 Chronic obstructive pulmonary disease, unspecified: Secondary | ICD-10-CM

## 2022-12-26 MED ORDER — TIOTROPIUM BROMIDE MONOHYDRATE 18 MCG IN CAPS
1.0000 | ORAL_CAPSULE | Freq: Every day | RESPIRATORY_TRACT | 0 refills | Status: DC
  Filled 2022-12-26 – 2023-01-01 (×3): qty 30, 30d supply, fill #0

## 2022-12-26 MED ORDER — BUDESONIDE-FORMOTEROL FUMARATE 160-4.5 MCG/ACT IN AERO
2.0000 | INHALATION_SPRAY | Freq: Two times a day (BID) | RESPIRATORY_TRACT | 5 refills | Status: DC
  Filled 2022-12-26: qty 10.2, 30d supply, fill #0
  Filled 2022-12-26: qty 10.2, fill #0
  Filled 2022-12-28: qty 10.2, 30d supply, fill #0
  Filled 2023-01-24: qty 10.2, 30d supply, fill #1
  Filled 2023-02-26: qty 10.2, 30d supply, fill #2
  Filled 2023-03-26: qty 10.2, 30d supply, fill #3
  Filled 2023-04-23: qty 10.2, 30d supply, fill #4
  Filled 2023-05-22: qty 10.2, 30d supply, fill #5

## 2022-12-27 ENCOUNTER — Other Ambulatory Visit: Payer: Self-pay

## 2022-12-27 MED ORDER — ALBUTEROL SULFATE HFA 108 (90 BASE) MCG/ACT IN AERS
2.0000 | INHALATION_SPRAY | Freq: Four times a day (QID) | RESPIRATORY_TRACT | 2 refills | Status: DC | PRN
  Filled 2022-12-27: qty 18, 25d supply, fill #0
  Filled 2023-01-24: qty 18, 25d supply, fill #1
  Filled 2023-02-26: qty 18, 25d supply, fill #2

## 2022-12-27 NOTE — Telephone Encounter (Signed)
Requested Prescriptions  Pending Prescriptions Disp Refills   albuterol (VENTOLIN HFA) 108 (90 Base) MCG/ACT inhaler 18 g 2    Sig: Inhale 2 puffs into the lungs every 6 (six) hours as needed for wheezing or shortness of breath     There is no refill protocol information for this order

## 2022-12-28 ENCOUNTER — Other Ambulatory Visit: Payer: Self-pay

## 2023-01-01 ENCOUNTER — Other Ambulatory Visit: Payer: Self-pay

## 2023-01-01 ENCOUNTER — Ambulatory Visit: Payer: Medicaid Other | Admitting: Pulmonary Disease

## 2023-01-01 ENCOUNTER — Telehealth: Payer: Medicaid Other | Admitting: Pulmonary Disease

## 2023-01-02 ENCOUNTER — Other Ambulatory Visit: Payer: Self-pay

## 2023-01-10 ENCOUNTER — Other Ambulatory Visit: Payer: Medicaid Other | Admitting: *Deleted

## 2023-01-10 NOTE — Patient Instructions (Signed)
Dear [name] [name],  Thank you for taking time to speak with me today about care coordination and care management services available to you at no cost as part of your Medicaid benefit. These services are voluntary. Our team is available to provide assistance regarding your health care needs at any time. Please do not hesitate to reach out to me if we can be of service to you at any time in the future.   Estanislado Emms RN, BSN [place]  [facility] RN Care Coordinator [phone]

## 2023-01-10 NOTE — Patient Outreach (Signed)
[name] [name] was referred to the [facility] Managed Care High Risk team for assistance with care coordination and care management services. Care coordination/care management services as part of the Medicaid benefit was offered to the patient today. The patient declined assistance offered today.   [name] [name] reports that he feels good and gets around good. He denies any needs and has assistance from his daughter with managing his health  Plan: The Medicaid Managed Care High Risk team is available at any time in the future to assist with care coordination/care management services upon referral.   Estanislado Emms RN, BSN [place]  [facility] RN Care Coordinator [phone]

## 2023-01-15 ENCOUNTER — Other Ambulatory Visit: Payer: Self-pay | Admitting: Family Medicine

## 2023-01-15 ENCOUNTER — Other Ambulatory Visit: Payer: Self-pay

## 2023-01-15 DIAGNOSIS — M1A9XX0 Chronic gout, unspecified, without tophus (tophi): Secondary | ICD-10-CM

## 2023-01-15 DIAGNOSIS — I10 Essential (primary) hypertension: Secondary | ICD-10-CM

## 2023-01-15 DIAGNOSIS — E7800 Pure hypercholesterolemia, unspecified: Secondary | ICD-10-CM

## 2023-01-15 MED ORDER — ALLOPURINOL 100 MG PO TABS
100.0000 mg | ORAL_TABLET | Freq: Every day | ORAL | 0 refills | Status: DC
  Filled 2023-01-15 – 2023-01-26 (×2): qty 90, 90d supply, fill #0

## 2023-01-15 MED ORDER — METOPROLOL SUCCINATE ER 50 MG PO TB24
50.0000 mg | ORAL_TABLET | Freq: Every day | ORAL | 0 refills | Status: DC
  Filled 2023-01-15 – 2023-01-26 (×2): qty 90, 90d supply, fill #0

## 2023-01-15 MED ORDER — AMLODIPINE BESYLATE 10 MG PO TABS
10.0000 mg | ORAL_TABLET | Freq: Every day | ORAL | 0 refills | Status: DC
  Filled 2023-01-15 – 2023-01-26 (×2): qty 90, 90d supply, fill #0

## 2023-01-15 MED ORDER — EZETIMIBE 10 MG PO TABS
10.0000 mg | ORAL_TABLET | Freq: Every day | ORAL | 0 refills | Status: DC
Start: 2023-01-15 — End: 2023-05-22
  Filled 2023-01-15: qty 90, 90d supply, fill #0

## 2023-01-16 ENCOUNTER — Other Ambulatory Visit: Payer: Self-pay

## 2023-01-20 DIAGNOSIS — J449 Chronic obstructive pulmonary disease, unspecified: Secondary | ICD-10-CM | POA: Diagnosis not present

## 2023-01-24 ENCOUNTER — Other Ambulatory Visit: Payer: Self-pay | Admitting: Pulmonary Disease

## 2023-01-24 ENCOUNTER — Other Ambulatory Visit: Payer: Self-pay

## 2023-01-24 DIAGNOSIS — J449 Chronic obstructive pulmonary disease, unspecified: Secondary | ICD-10-CM

## 2023-01-25 ENCOUNTER — Other Ambulatory Visit: Payer: Self-pay

## 2023-01-25 MED ORDER — TIOTROPIUM BROMIDE MONOHYDRATE 18 MCG IN CAPS
1.0000 | ORAL_CAPSULE | Freq: Every day | RESPIRATORY_TRACT | 1 refills | Status: DC
  Filled 2023-01-25: qty 30, 30d supply, fill #0
  Filled 2023-02-26: qty 30, 30d supply, fill #1

## 2023-01-25 NOTE — Telephone Encounter (Signed)
Refill request for Spiriva handihaler received from pharmacy.  PA will be required for handihaler, but insurance will approve for $4 Spiriva Resprimat 2.5. Is it ok to switch patient?  Message routed to Dr. Isaiah Serge to advise

## 2023-01-26 ENCOUNTER — Other Ambulatory Visit: Payer: Self-pay | Admitting: Family Medicine

## 2023-01-26 ENCOUNTER — Other Ambulatory Visit: Payer: Self-pay

## 2023-01-26 DIAGNOSIS — G5623 Lesion of ulnar nerve, bilateral upper limbs: Secondary | ICD-10-CM

## 2023-01-26 NOTE — Telephone Encounter (Signed)
Requested Prescriptions  Pending Prescriptions Disp Refills   gabapentin (NEURONTIN) 300 MG capsule 90 capsule 1    Sig: TAKE 1 CAPSULE (300 MG TOTAL) BY MOUTH AT BEDTIME.     Neurology: Anticonvulsants - gabapentin Passed - 01/26/2023  4:53 PM      Passed - Cr in normal range and within 360 days    Creat  Date Value Ref Range Status  09/17/2015 1.50 (H) 0.70 - 1.33 mg/dL Final   Creatinine, Ser  Date Value Ref Range Status  05/25/2022 1.12 0.76 - 1.27 mg/dL Final   Creatinine, Urine  Date Value Ref Range Status  10/26/2014 64.52 mg/dL Final         Passed - Completed PHQ-2 or PHQ-9 in the last 360 days      Passed - Valid encounter within last 12 months    Recent Outpatient Visits           8 months ago Hospital discharge follow-up   [place] [facility] Grayce Sessions, NP   10 months ago Screening for diabetes mellitus   [place] [facility] Hoy Register, MD   1 year ago Chronic respiratory failure with hypoxia [facility])   [place] [facility] Hoy Register, MD   2 years ago Essential hypertension   [place] [facility], Gildardo Pounds, NP   2 years ago Influenza vaccine refused   [place] [facility] Hoy Register, MD       Future Appointments             In 1 month Chilton Greathouse, MD [facility] [place] [facility] [place]   In 1 month Hoy Register, MD [facility]

## 2023-01-29 ENCOUNTER — Other Ambulatory Visit: Payer: Self-pay

## 2023-02-20 DIAGNOSIS — J449 Chronic obstructive pulmonary disease, unspecified: Secondary | ICD-10-CM | POA: Diagnosis not present

## 2023-02-26 ENCOUNTER — Other Ambulatory Visit: Payer: Self-pay

## 2023-02-28 ENCOUNTER — Other Ambulatory Visit: Payer: Self-pay

## 2023-02-28 ENCOUNTER — Ambulatory Visit: Payer: Medicaid Other | Admitting: Pulmonary Disease

## 2023-02-28 ENCOUNTER — Encounter: Payer: Self-pay | Admitting: Pulmonary Disease

## 2023-02-28 VITALS — BP 126/80 | HR 81 | Temp 97.6°F | Ht 66.0 in | Wt 204.2 lb

## 2023-02-28 DIAGNOSIS — J449 Chronic obstructive pulmonary disease, unspecified: Secondary | ICD-10-CM | POA: Diagnosis not present

## 2023-02-28 DIAGNOSIS — I272 Pulmonary hypertension, unspecified: Secondary | ICD-10-CM | POA: Diagnosis not present

## 2023-02-28 NOTE — Patient Instructions (Signed)
VISIT SUMMARY:  During your follow-up visit, we discussed your current management of COPD and reviewed your overall health. You reported that your breathing is stable with the use of Symbicort and Spiriva, although you find it difficult to rush activities. We also talked about lung cancer screening and vaccinations.  YOUR PLAN:  -CHRONIC OBSTRUCTIVE PULMONARY DISEASE (COPD): COPD is a chronic lung condition that makes it hard to breathe. Your symptoms are stable, and you should continue using Symbicort and Spiriva as prescribed. Keep up with your breathing treatments as needed.  -LUNG CANCER SCREENING: Given your history of smoking, we discussed the benefits and risks of annual lung cancer screening. You have chosen not to undergo screening at this time due to anxiety about potential findings.  -VACCINATIONS: We talked about the importance of getting vaccinated for flu, RSV, and COVID-19, especially because of your COPD. You declined the flu vaccine due to past experiences but are encouraged to consider RSV and COVID-19 vaccinations at a local pharmacy.  INSTRUCTIONS:  Your condition is stable. Please schedule a video visit in six months.

## 2023-02-28 NOTE — Progress Notes (Signed)
[name] [name]    [id number]    11-23-1960  Primary Care Physician:[name] [name], [name] Horns, MD  Referring Physician: Hoy Register, MD [address] [place],  [place] [address]  Chief complaint:   Follow up for COPD Pulmonary HTN  HPI: [name] is a 62 y.o.  with extensive smoking history. His main complaint is dyspnea on exertion for the past several years. He has chronic cough with mucus production. Denies any wheezing, hemoptysis. He was maintained initially on just albuterol rescue inhaler. He is maintained on spiriva. He feels very currently. He has dyspnea on exertion which is not different from baseline. Denies any cough, sputum production, hemoptysis, fever, weight loss.  Admitted to hospital from 1/31 to 05/14/2022 with COVID-19 pneumonia, COPD exacerbation, acute on chronic hypoxic respiratory failure.  Treated with steroids, bronchodilators.  He received 1 dose of Actemra on 05/10/2022.  Initially on high flow nasal cannula which was weaned to his usual home regimen of 6 L oxygen.  He has over 25 years pack year smoking history. Quit in 2020   Interim history: Discussed the use of AI scribe software for clinical note transcription with the patient, who gave verbal consent to proceed.  The patient, with a history of COPD, presents for a follow-up visit. He reports that his breathing is not good, but he is managing with Symbicort and Spiriva. He notes that he can't rush to do anything anymore due to his breathing. He was hospitalized in February, but has not had any anxiety since then. He quit smoking after 2020. He has declined annual scans, expressing a preference not to worry about potential health issues until he becomes symptomatic. He is very cautious about infection control, using Lysol and sanitizer regularly, and wearing a mask even at home.   Outpatient Encounter Medications as of 04/17/2019  Medication Sig   albuterol (PROVENTIL) (2.5 MG/3ML) 0.083%  nebulizer solution INHALE 3 MLS (1 VIAL) EVERY 6 HOURS VIA NEBULIAZATION ROUTE AS NEEDED FOR SHORTNESS OF BREATH (Patient taking differently: Take 2.5 mg by nebulization every 6 (six) hours as needed for shortness of breath. )   albuterol (VENTOLIN HFA) 108 (90 Base) MCG/ACT inhaler INHALE 2 PUFFS INTO THE LUNGS EVERY 4 HOURS AS NEEDED FOR WHEEZING OR SHORTNESS OF BREATH.   allopurinol (ZYLOPRIM) 100 MG tablet Take 1 tablet (100 mg total) by mouth daily.   amLODipine (NORVASC) 10 MG tablet Take 1 tablet (10 mg total) by mouth daily.   ammonium lactate (LAC-HYDRIN) 12 % lotion Apply 1 application topically daily.   AZO-CRANBERRY PO Take 2 tablets by mouth every 30 (thirty) days.   budesonide-formoterol (SYMBICORT) 160-4.5 MCG/ACT inhaler INAHLE 2 PUFFS INTO THE LUNGS TWICE DAILY.   Cyanocobalamin (VITAMIN B-12) 1000 MCG SUBL Place 1 tablet (1,000 mcg total) under the tongue daily.   cyclobenzaprine (FLEXERIL) 5 MG tablet Take 1 tablet (5 mg total) by mouth 2 (two) times daily as needed for muscle spasms.   diphenhydrAMINE (BENADRYL ALLERGY) 25 MG tablet Take 50 mg by mouth daily as needed (allergies).    ezetimibe (ZETIA) 10 MG tablet Take 1 tablet (10 mg total) by mouth daily.   gabapentin (NEURONTIN) 300 MG capsule Take 1 capsule (300 mg total) by mouth at bedtime.   hydrocortisone 1 % ointment Apply 1 application topically 2 (two) times daily.   losartan (COZAAR) 50 MG tablet Take 1 tablet (50 mg total) by mouth daily. Needs PCP appointment.   metoprolol succinate (TOPROL-XL)  50 MG 24 hr tablet Take 1 tablet (50 mg total) by mouth daily.   tiotropium (SPIRIVA HANDIHALER) 18 MCG inhalation capsule Place 1 capsule (18 mcg total) into inhaler and inhale daily.   triamcinolone ointment (KENALOG) 0.1 % APPLY 1 APPLICATION TOPICALLY 2 TIMES DAILY. (Patient taking differently: Apply 1 application topically 2 (two) times daily. )   [DISCONTINUED] ALBUTEROL IN Inhale into the lungs.   No  facility-administered encounter medications on file as of 04/17/2019.   Physical Exam: Blood pressure 126/80, pulse 81, temperature 97.6 F (36.4 C), temperature source Oral, height 5\' 6"  (1.676 m), weight 204 lb 3.2 oz (92.6 kg), SpO2 93%. Gen:      No acute distress HEENT:  EOMI, sclera anicteric Neck:     No masses; no thyromegaly Lungs:    Clear to auscultation bilaterally; normal respiratory effort CV:         Regular rate and rhythm; no murmurs Abd:      + bowel sounds; soft, non-tender; no palpable masses, no distension Ext:    No edema; adequate peripheral perfusion Skin:      Warm and dry; no rash Neuro: alert and oriented x 3 Psych: normal mood and affect   Data Reviewed: Imaging CTA scan 12/13/13-no PE, mild airway thickening, atelectasis, cardiomegaly   CT chest 10/30/2018-emphysema in the upper lobes.  Mild lower lobe atelectasis.  Chest x-ray 05/10/2022-bibasilar opacification, atelectasis, emphysema I have reviewed the images personally.  PFTs. 07/22/15 2.19 [60%], FEV1 1.07 [37%],/49, DLCO 10.99 [38%] Severe obstruction with severe diffusion impairment   Cardiac Echo 10/27/14 Normal LV function; mild LVH and LVE; grade 1 diastolic dysfunction; moderate LAE; mild RAE; trace TR; severely elevated pulmonary pressure. PAP 72  Echo 07/21/2016 LVEF 60-65%, grade 1 diastolic dysfunction, PAP 42  Sleep study 06/15/15 No significant obstructive sleep apnea, moderate O2 desaturation  Labs Alpha-1 antitrypsin 06/16/2016-137, PI MM HIV 07/06/2016-negative SSB 7.9, double-stranded DNA, SSA, rheumatoid factor-negative Hepatic function panel 07/17/2017-negative  Assessment:  Severe Chronic Obstructive Pulmonary Disease (COPD) Stable symptoms, no acute exacerbations. On Symbicort and Spiriva. Lung sounds clear on examination. -Continue Symbicort and Spiriva. -Continue breathing treatments as needed.  Lung Cancer Screening Patient has a history of smoking but quit in 2020.  Discussed the benefits and risks of annual lung cancer screening. Patient declined due to anxiety about potential findings. -No lung cancer screening at this time per patient's preference.  Pulmonary hypertension. 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  Continue supplemental oxygen Repeat echo in 2018 shows improvement in PA pressures Work-up for alternative PAH etiologies including HIV, LFTs, ANA, RF, CCP is negative.  He has mild elevation in SSB which is likely nonspecific. We will continue to monitor  Vaccinations Prevnar 12/22/2013 Pneumovax 07/30/2012 Discussed the importance of flu, RSV, and COVID-19 vaccinations, especially in the context of COPD. Patient declined flu vaccine due to past experiences of feeling unwell post-vaccination. -Encouraged patient to consider RSV and COVID-19 vaccinations at a local pharmacy.  Follow-up Patient's condition is stable. -Schedule a video visit in six months.   Plan/Recommendations: - Continue spiriva, symbicort - Continue supplemental O2  Chilton Greathouse MD [facility] 02/28/2023, 10:14 AM  CC: [name] Register, MD

## 2023-03-12 ENCOUNTER — Ambulatory Visit: Payer: Medicaid Other | Admitting: Family Medicine

## 2023-03-22 DIAGNOSIS — J449 Chronic obstructive pulmonary disease, unspecified: Secondary | ICD-10-CM | POA: Diagnosis not present

## 2023-03-26 ENCOUNTER — Other Ambulatory Visit: Payer: Self-pay | Admitting: Family Medicine

## 2023-03-26 ENCOUNTER — Other Ambulatory Visit: Payer: Self-pay | Admitting: Pulmonary Disease

## 2023-03-26 ENCOUNTER — Other Ambulatory Visit: Payer: Self-pay

## 2023-03-26 DIAGNOSIS — J449 Chronic obstructive pulmonary disease, unspecified: Secondary | ICD-10-CM

## 2023-03-27 ENCOUNTER — Other Ambulatory Visit: Payer: Self-pay

## 2023-03-27 MED ORDER — TIOTROPIUM BROMIDE MONOHYDRATE 18 MCG IN CAPS
1.0000 | ORAL_CAPSULE | Freq: Every day | RESPIRATORY_TRACT | 1 refills | Status: DC
  Filled 2023-03-27: qty 30, 30d supply, fill #0
  Filled 2023-04-23: qty 30, 30d supply, fill #1

## 2023-03-27 MED ORDER — ALBUTEROL SULFATE HFA 108 (90 BASE) MCG/ACT IN AERS
2.0000 | INHALATION_SPRAY | Freq: Four times a day (QID) | RESPIRATORY_TRACT | 2 refills | Status: DC | PRN
  Filled 2023-03-27: qty 18, 25d supply, fill #0
  Filled 2023-04-23: qty 18, 25d supply, fill #1
  Filled 2023-05-22: qty 18, 25d supply, fill #2

## 2023-03-28 ENCOUNTER — Other Ambulatory Visit: Payer: Self-pay

## 2023-03-29 ENCOUNTER — Telehealth: Payer: Self-pay | Admitting: *Deleted

## 2023-03-29 ENCOUNTER — Other Ambulatory Visit: Payer: Self-pay

## 2023-03-29 NOTE — Telephone Encounter (Addendum)
Chart reviewed and [employer] verification completed.    Chart review highlights and potential barriers to colorectal cancer screening (CRC) screening: Per patient's health maintenance, patient has never had colonoscopy.  Colonoscopy discussed during Health Equity CRC Screening patient outreach on 06/13/22 and patient stated he would think about getting colonoscopy and discussed with Dr. Alvis Lemmings during next office visit on 05/21/23.  Patient has also had a Cologuard kit ordered in the past on 11/04/22 but did not complete.  Cologuard order expired on 11/08/20.    Telephone call to patient, no answer, left voicemail message, advised calling from Dr. Baxter Flattery office, and requested call back at my direct extension [phone]).    Kijuana Ruppel H. Docia Furl, Charity fundraiser, [facility] [phone]

## 2023-04-03 DIAGNOSIS — U071 COVID-19: Secondary | ICD-10-CM | POA: Diagnosis not present

## 2023-04-03 DIAGNOSIS — J449 Chronic obstructive pulmonary disease, unspecified: Secondary | ICD-10-CM | POA: Diagnosis not present

## 2023-04-06 ENCOUNTER — Telehealth: Payer: Self-pay | Admitting: *Deleted

## 2023-04-06 NOTE — Telephone Encounter (Signed)
 Chart reviewed and [employer] verification completed.    Chart review highlights and potential barriers to colorectal cancer screening (CRC) screening: Per patient's health maintenance, patient has never had colonoscopy.  Colonoscopy discussed during Health Equity CRC Screening patient outreach on 06/13/22 and patient stated he would think about getting colonoscopy and discussed with Dr. Alvis Lemmings during next office visit on 05/21/23.  Patient has also had a Cologuard kit ordered in the past on 11/04/22 but did not complete.  Cologuard order expired on 11/08/20.    Telephone call to patient, no answer, left voicemail message, advised calling from Dr. Baxter Flattery office, and requested call back at my direct extension [phone]).    Kijuana Ruppel H. Docia Furl, Charity fundraiser, [facility] [phone]

## 2023-04-10 ENCOUNTER — Telehealth: Payer: Self-pay | Admitting: *Deleted

## 2023-04-10 NOTE — Telephone Encounter (Addendum)
 Dr. Millard team follow up required: Patient would like to discuss possible referral for colonoscopy with Dr. Newlin at next office visit on 05/21/23. May also contact Monta Fell BSN, RN,CCM, Health Equity Program Manager (HEPM) at [phone]  and/ or review HEPM outreach notes, if additional information needed.  Chart reviewed and [employer] verification completed.    Chart review highlights and potential barriers to colorectal cancer screening (CRC) screening: Per patient's health maintenance, patient has never had colonoscopy.  Colonoscopy discussed during Health Equity CRC Screening patient outreach on 06/13/22 and patient stated he would think about getting colonoscopy and discussed with Dr. Newlin during next office visit on 05/21/23.  Patient has also had a Cologuard kit ordered in the past on 11/04/22 but did not complete (See below Addendum).  Cologuard order expired on 11/08/20.     Received voicemail from patient, states he is returning HEPM's [facility]) call, and requested call back at home number.   Telephone call to patient, verify patient's name, date of birth, and address. Patient in agreement to outreach and colon cancer screening (CRC) follow up. Discussed patient's colon cancer screening history per chart review.  Discussed CRC screening overview for colonoscopy, importance of CRC screening, transportation requirements, 24 hour caregiver requirements, patient voices understanding, and is in agreement to discussing with Dr. Newlin during next office visit. Patient states he is very appreciative of HEPM's call and is not ready to schedule colonoscopy at this time. States he will discuss with Dr. Newlin during his February appointment. No further HEPM interventions needed at this time.   Creedon Danielski H. Fell BSN, RN, [facility] Health Equity Program Manager [phone]   Addendum: Patient has also had a Cologuard kit ordered in the past on 11/04/19 but did not  complete.

## 2023-04-12 NOTE — Telephone Encounter (Signed)
 Noted.

## 2023-04-17 ENCOUNTER — Telehealth: Payer: Self-pay | Admitting: *Deleted

## 2023-04-17 NOTE — Telephone Encounter (Signed)
 Chart reviewed and [employer] verification completed.    Chart review highlights and potential barriers to colorectal cancer screening (CRC) screening: Per patient's health maintenance, patient has never had colonoscopy.  Colonoscopy discussed during Health Equity CRC Screening patient outreach on 06/13/22 and patient stated he would think about getting colonoscopy and discussed with Dr. Newlin during next office visit on 05/21/23.  Patient has also had a Cologuard kit ordered in the past on 11/04/22 but did not complete (See below Addendum).  Cologuard order expired on 11/08/20.      Received voicemail from patient times 2, states he is returning HEPM's [facility]) call, and requested call back at home number.    Telephone call from patient, verify patient's name, date of birth, and address. Patient in agreement to outreach and colon cancer screening (CRC) follow up. Discussed patient's colon cancer screening history per chart review.  HEPM advised patient of our conversation on 04/10/23. Discussed CRC screening overview for colonoscopy, importance of CRC screening, transportation requirements, 24 hour caregiver requirements, patient voices understanding, and is in agreement to discussing with Dr. Newlin during next office visit. Patient states he is very appreciative of HEPM's call and is still not ready to schedule colonoscopy at this time. States he will discuss with Dr. Newlin during his February appointment. States he will call HEPM or Dr. Newlin if he has any additional questions prior to follow up appointment. No further HEPM interventions needed at this time.    Ariel Dimitri H. Wonda BURKITT, CHARITY FUNDRAISER, [facility] [phone]

## 2023-04-22 DIAGNOSIS — J449 Chronic obstructive pulmonary disease, unspecified: Secondary | ICD-10-CM | POA: Diagnosis not present

## 2023-04-23 ENCOUNTER — Other Ambulatory Visit: Payer: Self-pay

## 2023-04-24 ENCOUNTER — Other Ambulatory Visit: Payer: Self-pay

## 2023-05-21 ENCOUNTER — Ambulatory Visit: Payer: Medicaid Other | Admitting: Family Medicine

## 2023-05-22 ENCOUNTER — Other Ambulatory Visit: Payer: Self-pay

## 2023-05-22 ENCOUNTER — Other Ambulatory Visit: Payer: Self-pay | Admitting: Family Medicine

## 2023-05-22 DIAGNOSIS — M1A9XX0 Chronic gout, unspecified, without tophus (tophi): Secondary | ICD-10-CM

## 2023-05-22 DIAGNOSIS — J449 Chronic obstructive pulmonary disease, unspecified: Secondary | ICD-10-CM

## 2023-05-22 DIAGNOSIS — E7800 Pure hypercholesterolemia, unspecified: Secondary | ICD-10-CM

## 2023-05-22 DIAGNOSIS — I10 Essential (primary) hypertension: Secondary | ICD-10-CM

## 2023-05-22 MED ORDER — TIOTROPIUM BROMIDE MONOHYDRATE 18 MCG IN CAPS
1.0000 | ORAL_CAPSULE | Freq: Every day | RESPIRATORY_TRACT | 0 refills | Status: DC
  Filled 2023-05-22: qty 30, 30d supply, fill #0

## 2023-05-22 NOTE — Telephone Encounter (Signed)
Requested by interface surescripts. Future visit in 4 weeks.  No refills remain. Requested Prescriptions  Pending Prescriptions Disp Refills   tiotropium (SPIRIVA HANDIHALER) 18 MCG inhalation capsule 30 capsule 0    Sig: PLACE 1 CAPSULE (18 MCG TOTAL) INTO INHALER AND INHALE DAILY.     Pulmonology:  Anticholinergic Agents Failed - 05/22/2023  1:44 PM      Failed - Valid encounter within last 12 months    Recent Outpatient Visits           12 months ago Hospital discharge follow-up   [place] [facility] Grayce Sessions, NP   1 year ago Screening for diabetes mellitus   [place] [facility] - A Dept Of [place]. [facility] Hoy Register, MD   2 years ago Chronic respiratory failure with hypoxia [facility])   [facility] [facility] - A Dept Of [place]. [facility] Hoy Register, MD   2 years ago Essential hypertension   [place] [facility], Gildardo Pounds, NP   2 years ago Influenza vaccine refused   [place] [facility] [place] - A Dept Of [place]. [facility] Hoy Register, MD       Future Appointments             In 4 weeks Hoy Register, MD [facility] - A Dept Of Eligha Bridegroom. [facility]

## 2023-05-22 NOTE — Telephone Encounter (Unsigned)
Copied from CRM [id number]. Topic: Clinical - Medication Refill >> May 22, 2023  3:32 PM Payton Doughty wrote: Most Recent Primary Care Visit:  Provider: Grayce Sessions  [facility]  Visit Type: OFFICE VISIT  Date: 05/25/2022  Medication:  amLODipine (NORVASC) 10 MG tablet ezetimibe (ZETIA) 10 MG tablet metoprolol succinate (TOPROL-XL) 50 MG 24 hr tablet allopurinol (ZYLOPRIM) 100 MG tablet Has the patient contacted their pharmacy? Yes Pt just called in today.  I did advise him it can take up to 72 hrs for a refill.  Is this the correct pharmacy for this prescription? Yes If no, delete pharmacy and type the correct one.  This is the patient's preferred pharmacy:  [facility] [address] [place] [address] Phone: [phone] Fax: [phone]  [facility] [address] [place]), [place] - [address] [place] [address] Phone: [phone] Fax: [phone]   Has the prescription been filled recently? No  Is the patient out of the medication? Yes  Has the patient been seen for an appointment in the last year OR does the patient have an upcoming appointment? Yes  Can we respond through MyChart? Yes  Agent: Please be advised that Rx refills may take up to 3 business days. We ask that you follow-up with your pharmacy.

## 2023-05-22 NOTE — Telephone Encounter (Signed)
Requested medication (s) are due for refill today: yes  Requested medication (s) are on the active medication list: yes  Last refill:  01/15/23 #90 0 refills  Future visit scheduled: yes in 4 weeks   Notes to clinic:  protocol failed last labs 05/25/22 no refills remain. Do you want to refill Rx?     Requested Prescriptions  Pending Prescriptions Disp Refills   allopurinol (ZYLOPRIM) 100 MG tablet 90 tablet 0    Sig: Take 1 tablet (100 mg total) by mouth daily.     Endocrinology:  Gout Agents - allopurinol Failed - 05/22/2023  1:36 PM      Failed - Uric Acid in normal range and within 360 days    Uric Acid, Serum  Date Value Ref Range Status  09/17/2015 7.2 4.0 - 8.0 mg/dL Final    Comment:    Therapeutic target for gout patients: <6.0 mg/dL         Failed - Cr in normal range and within 360 days    Creat  Date Value Ref Range Status  09/17/2015 1.50 (H) 0.70 - 1.33 mg/dL Final   Creatinine, Ser  Date Value Ref Range Status  05/25/2022 1.12 0.76 - 1.27 mg/dL Final   Creatinine, Urine  Date Value Ref Range Status  10/26/2014 64.52 mg/dL Final         Failed - Valid encounter within last 12 months    Recent Outpatient Visits           12 months ago Hospital discharge follow-up   [place] [facility] Grayce Sessions, NP   1 year ago Screening for diabetes mellitus   [place] [facility] [place] - A Dept Of [place]. [facility] Hoy Register, MD   2 years ago Chronic respiratory failure with hypoxia [facility])   [place] [facility] - A Dept Of [place]. [facility] Hoy Register, MD   2 years ago Essential hypertension   [place] [facility], Gildardo Pounds, NP   2 years ago Influenza vaccine refused    [facility] [place] - A Dept Of . [facility] Hoy Register, MD       Future Appointments             In 4 weeks Hoy Register, MD [facility] - A Dept Of [facility]igha Bridegroom. [facility]            Failed - CBC within normal limits and completed in the last 12 months    WBC  Date Value Ref Range Status  05/25/2022 11.0 (H) 3.4 - 10.8 x10E3/uL Final  05/14/2022 24.7 (H) 4.0 - 10.5 K/uL Final   RBC  Date Value Ref Range Status  05/25/2022 5.18 4.14 - 5.80 x10E6/uL Final  05/14/2022 5.69 4.22 - 5.81 MIL/uL Final   Hemoglobin  Date Value Ref Range Status  05/25/2022 12.6 (L) 13.0 - 17.7 g/dL Final   Hematocrit  Date Value Ref Range Status  05/25/2022 40.5 37.5 - 51.0 % Final   MCHC  Date Value Ref Range Status  05/25/2022 31.1 (L) 31.5 - 35.7 g/dL Final  82/95/6213 08.6 30.0 - 36.0 g/dL Final   [facility]  Date Value Ref Range Status  05/25/2022 24.3 (L) 26.6 - 33.0 pg Final  05/14/2022 24.1 (L) 26.0 - 34.0 pg Final   MCV  Date Value Ref Range Status  05/25/2022 78 (L) 79 -  97 fL Final   No results found for: "PLTCOUNTKUC", "LABPLAT", "POCPLA" RDW  Date Value Ref Range Status  05/25/2022 12.2 11.6 - 15.4 % Final          amLODipine (NORVASC) 10 MG tablet 90 tablet 0    Sig: Take 1 tablet (10 mg total) by mouth daily.     Cardiovascular: Calcium Channel Blockers 2 Failed - 05/22/2023  1:36 PM      Failed - Valid encounter within last 6 months    Recent Outpatient Visits           12 months ago Hospital discharge follow-up   [place] [facility] Gray[facility] Sessions, NP   1 year ago Screening for diabetes mellitus   [facility] [facility] - A Dept Of [place]. [facility] Hoy Register, MD   2 years ago Chronic respiratory failure with hypoxia [facility])   [place] [facility] - A Dept Of [place]. [facility] Hoy Register, MD   2 years ago Essential hypertension   [place] [facility], Gildardo Pounds, NP   2 years ago Influenza vaccine refused   [place] [facility] [place] - A Dept Of [place]. [facility] Hoy Register, MD       Future Appointments             In 4 weeks Hoy Register, MD [facility] - A Dept Of Eligha Bridegroom. [facility]            Passed - Last BP in normal range    BP Readings from Last 1 Encounters:  02/28/23 126/80         Passed - Last Heart Rate in normal range    Pulse Readings from Last 1 Encounters:  02/28/23 81          metoprolol succinate (TOPROL-XL) 50 MG 24 hr tablet 90 tablet 0    Sig: Take 1 tablet (50 mg total) by mouth daily.     Cardiovascular:  Beta Blockers Failed - 05/22/2023  1:36 PM      Failed - Valid encounter within last 6 months    Recent Outpatient Visits           12 months ago Hospital discharge follow-up   [place] [facility] Grayce Sessions, NP   1 year ago Screening for diabetes mellitus   [place] [facility] - A Dept Of [place]. [facility] Hoy Register, MD   2 years ago Chronic respiratory failure with hypoxia [facility])   [place] [facility] - A Dept Of [place]. [facility] Hoy Register, MD   2 years ago Essential hypertension   [place] [facility], Gildardo Pounds, NP   2 years ago Influenza vaccine refused    [facility] [place] - A Dept Of [place]. [facility] Hoy Register, MD       Future Appointments             In 4 weeks Hoy Register, MD [facility] - A Dept Of Eligha Bridegroom. [facility]            Passed - Last BP in normal range    BP Readings from Last 1 Encounters:  02/28/23 126/80         Passed -  Last Heart Rate in normal range    Pulse Readings from Last 1 Encounters:  02/28/23 81          ezetimibe (ZETIA) 10 MG tablet 90 tablet 0    Sig: Take 1 tablet (10 mg total) by mouth daily.     Cardiovascular:  Antilipid - Sterol Transport Inhibitors Failed - 05/22/2023  1:36 PM      Failed - AST in normal  range and within 360 days    AST  Date Value Ref Range Status  05/25/2022 19 0 - 40 IU/L Final         Failed - ALT in normal range and within 360 days    ALT  Date Value Ref Range Status  05/25/2022 32 0 - 44 IU/L Final         Failed - Valid encounter within last 12 months    Recent Outpatient Visits           12 months ago Hospital discharge follow-up   [place] [facility] Gray[facility] Sessions, NP   1 year ago Screening for diabetes mellitus   [place] [facility] - A Dept Of [place]. [facility] Hoy Register, MD   2 years ago Chronic respiratory failure with hypoxia [facility])   [place] [facility] - A Dept Of [place]. [facility] Hoy Register, MD   2 years ago Essential hypertension   [place] [facility], Gildardo Pounds, NP   2 years ago Influenza vaccine refused   [place] [facility] [place] - A Dept Of [place]. [facility] Hoy Register, MD       Future Appointments             In 4 weeks Hoy Register, MD [facility] - A Dept Of Eligha Bridegroom. [facility]            Failed - Lipid Panel in normal range within the last 12 months    Cholesterol, Total  Date Value Ref Range Status  06/08/2020 222 (H) 100 - 199 mg/dL Final   LDL Chol Calc (NIH)  Date Value Ref Range Status  06/08/2020 161 (H) 0 - 99 mg/dL Final   HDL  Date Value Ref Range Status  06/08/2020 48 >39 mg/dL Final   Triglycerides  Date Value Ref Range Status  06/08/2020 76 0 - 149 mg/dL Final         Passed - Patient is not pregnant

## 2023-05-23 ENCOUNTER — Other Ambulatory Visit (HOSPITAL_BASED_OUTPATIENT_CLINIC_OR_DEPARTMENT_OTHER): Payer: Self-pay

## 2023-05-23 ENCOUNTER — Other Ambulatory Visit: Payer: Self-pay

## 2023-05-23 DIAGNOSIS — J449 Chronic obstructive pulmonary disease, unspecified: Secondary | ICD-10-CM | POA: Diagnosis not present

## 2023-05-23 MED ORDER — ALLOPURINOL 100 MG PO TABS
100.0000 mg | ORAL_TABLET | Freq: Every day | ORAL | 0 refills | Status: DC
  Filled 2023-05-23: qty 90, 90d supply, fill #0

## 2023-05-23 MED ORDER — AMLODIPINE BESYLATE 10 MG PO TABS
10.0000 mg | ORAL_TABLET | Freq: Every day | ORAL | 0 refills | Status: DC
  Filled 2023-05-23: qty 90, 90d supply, fill #0

## 2023-05-23 MED ORDER — METOPROLOL SUCCINATE ER 50 MG PO TB24
50.0000 mg | ORAL_TABLET | Freq: Every day | ORAL | 0 refills | Status: DC
Start: 2023-05-23 — End: 2023-10-10
  Filled 2023-05-23: qty 90, 90d supply, fill #0

## 2023-05-23 MED ORDER — EZETIMIBE 10 MG PO TABS
10.0000 mg | ORAL_TABLET | Freq: Every day | ORAL | 0 refills | Status: DC
  Filled 2023-05-23: qty 90, 90d supply, fill #0

## 2023-05-24 ENCOUNTER — Other Ambulatory Visit: Payer: Self-pay

## 2023-05-25 ENCOUNTER — Other Ambulatory Visit: Payer: Self-pay | Admitting: Family Medicine

## 2023-05-25 ENCOUNTER — Other Ambulatory Visit: Payer: Self-pay

## 2023-05-25 DIAGNOSIS — G5623 Lesion of ulnar nerve, bilateral upper limbs: Secondary | ICD-10-CM

## 2023-05-30 ENCOUNTER — Other Ambulatory Visit: Payer: Self-pay

## 2023-05-31 ENCOUNTER — Other Ambulatory Visit: Payer: Self-pay

## 2023-06-12 ENCOUNTER — Other Ambulatory Visit: Payer: Self-pay | Admitting: Pulmonary Disease

## 2023-06-12 ENCOUNTER — Other Ambulatory Visit: Payer: Self-pay

## 2023-06-12 DIAGNOSIS — J449 Chronic obstructive pulmonary disease, unspecified: Secondary | ICD-10-CM

## 2023-06-12 MED ORDER — BUDESONIDE-FORMOTEROL FUMARATE 160-4.5 MCG/ACT IN AERO
2.0000 | INHALATION_SPRAY | Freq: Two times a day (BID) | RESPIRATORY_TRACT | 5 refills | Status: DC
  Filled 2023-06-12 – 2023-06-21 (×3): qty 10.2, 30d supply, fill #0
  Filled 2023-07-20: qty 10.2, 30d supply, fill #1
  Filled 2023-08-22: qty 10.2, 30d supply, fill #2
  Filled 2023-09-18: qty 10.2, 30d supply, fill #3
  Filled 2023-11-12 (×2): qty 10.2, 30d supply, fill #4
  Filled 2023-12-19: qty 10.2, 30d supply, fill #5

## 2023-06-13 ENCOUNTER — Encounter: Payer: Self-pay | Admitting: Nurse Practitioner

## 2023-06-13 ENCOUNTER — Ambulatory Visit: Payer: Self-pay | Admitting: Pulmonary Disease

## 2023-06-13 ENCOUNTER — Telehealth: Payer: Self-pay | Admitting: *Deleted

## 2023-06-13 ENCOUNTER — Inpatient Hospital Stay (HOSPITAL_COMMUNITY)
Admission: EM | Admit: 2023-06-13 | Discharge: 2023-06-20 | DRG: 189 | Disposition: A | Discharge status: Home Health | Attending: Internal Medicine | Admitting: Internal Medicine

## 2023-06-13 ENCOUNTER — Encounter (HOSPITAL_COMMUNITY): Payer: Self-pay | Admitting: Internal Medicine

## 2023-06-13 ENCOUNTER — Other Ambulatory Visit: Payer: Self-pay

## 2023-06-13 ENCOUNTER — Telehealth: Admitting: Nurse Practitioner

## 2023-06-13 ENCOUNTER — Emergency Department (HOSPITAL_COMMUNITY)

## 2023-06-13 DIAGNOSIS — I272 Pulmonary hypertension, unspecified: Secondary | ICD-10-CM | POA: Diagnosis present

## 2023-06-13 DIAGNOSIS — Z8616 Personal history of COVID-19: Secondary | ICD-10-CM | POA: Diagnosis not present

## 2023-06-13 DIAGNOSIS — Z87891 Personal history of nicotine dependence: Secondary | ICD-10-CM

## 2023-06-13 DIAGNOSIS — E871 Hypo-osmolality and hyponatremia: Secondary | ICD-10-CM | POA: Diagnosis not present

## 2023-06-13 DIAGNOSIS — Z91014 Allergy to mammalian meats: Secondary | ICD-10-CM

## 2023-06-13 DIAGNOSIS — J440 Chronic obstructive pulmonary disease with acute lower respiratory infection: Secondary | ICD-10-CM | POA: Diagnosis not present

## 2023-06-13 DIAGNOSIS — J9621 Acute and chronic respiratory failure with hypoxia: Secondary | ICD-10-CM

## 2023-06-13 DIAGNOSIS — M109 Gout, unspecified: Secondary | ICD-10-CM | POA: Diagnosis not present

## 2023-06-13 DIAGNOSIS — J9622 Acute and chronic respiratory failure with hypercapnia: Secondary | ICD-10-CM | POA: Diagnosis not present

## 2023-06-13 DIAGNOSIS — R0902 Hypoxemia: Secondary | ICD-10-CM | POA: Diagnosis not present

## 2023-06-13 DIAGNOSIS — Z7951 Long term (current) use of inhaled steroids: Secondary | ICD-10-CM | POA: Diagnosis not present

## 2023-06-13 DIAGNOSIS — E785 Hyperlipidemia, unspecified: Secondary | ICD-10-CM | POA: Diagnosis not present

## 2023-06-13 DIAGNOSIS — J441 Chronic obstructive pulmonary disease with (acute) exacerbation: Secondary | ICD-10-CM | POA: Diagnosis not present

## 2023-06-13 DIAGNOSIS — N1831 Chronic kidney disease, stage 3a: Secondary | ICD-10-CM | POA: Diagnosis present

## 2023-06-13 DIAGNOSIS — D509 Iron deficiency anemia, unspecified: Secondary | ICD-10-CM | POA: Diagnosis not present

## 2023-06-13 DIAGNOSIS — J449 Chronic obstructive pulmonary disease, unspecified: Secondary | ICD-10-CM | POA: Diagnosis not present

## 2023-06-13 DIAGNOSIS — R0609 Other forms of dyspnea: Secondary | ICD-10-CM | POA: Diagnosis not present

## 2023-06-13 DIAGNOSIS — I129 Hypertensive chronic kidney disease with stage 1 through stage 4 chronic kidney disease, or unspecified chronic kidney disease: Secondary | ICD-10-CM | POA: Diagnosis not present

## 2023-06-13 DIAGNOSIS — Z515 Encounter for palliative care: Secondary | ICD-10-CM | POA: Diagnosis not present

## 2023-06-13 DIAGNOSIS — Z8249 Family history of ischemic heart disease and other diseases of the circulatory system: Secondary | ICD-10-CM | POA: Diagnosis not present

## 2023-06-13 DIAGNOSIS — J988 Other specified respiratory disorders: Secondary | ICD-10-CM | POA: Diagnosis not present

## 2023-06-13 DIAGNOSIS — B9789 Other viral agents as the cause of diseases classified elsewhere: Secondary | ICD-10-CM | POA: Diagnosis not present

## 2023-06-13 DIAGNOSIS — M1A9XX0 Chronic gout, unspecified, without tophus (tophi): Secondary | ICD-10-CM | POA: Diagnosis not present

## 2023-06-13 DIAGNOSIS — Z7189 Other specified counseling: Secondary | ICD-10-CM | POA: Diagnosis not present

## 2023-06-13 DIAGNOSIS — Z800 Family history of malignant neoplasm of digestive organs: Secondary | ICD-10-CM

## 2023-06-13 DIAGNOSIS — I071 Rheumatic tricuspid insufficiency: Secondary | ICD-10-CM | POA: Diagnosis not present

## 2023-06-13 DIAGNOSIS — I10 Essential (primary) hypertension: Secondary | ICD-10-CM | POA: Diagnosis not present

## 2023-06-13 DIAGNOSIS — Z1152 Encounter for screening for COVID-19: Secondary | ICD-10-CM

## 2023-06-13 DIAGNOSIS — U071 COVID-19: Secondary | ICD-10-CM | POA: Diagnosis not present

## 2023-06-13 DIAGNOSIS — J439 Emphysema, unspecified: Secondary | ICD-10-CM | POA: Diagnosis not present

## 2023-06-13 DIAGNOSIS — Z888 Allergy status to other drugs, medicaments and biological substances status: Secondary | ICD-10-CM

## 2023-06-13 DIAGNOSIS — Z79899 Other long term (current) drug therapy: Secondary | ICD-10-CM

## 2023-06-13 DIAGNOSIS — Z8701 Personal history of pneumonia (recurrent): Secondary | ICD-10-CM

## 2023-06-13 DIAGNOSIS — B342 Coronavirus infection, unspecified: Secondary | ICD-10-CM | POA: Diagnosis not present

## 2023-06-13 DIAGNOSIS — Z66 Do not resuscitate: Secondary | ICD-10-CM | POA: Diagnosis not present

## 2023-06-13 DIAGNOSIS — Z9981 Dependence on supplemental oxygen: Secondary | ICD-10-CM | POA: Diagnosis not present

## 2023-06-13 DIAGNOSIS — R0602 Shortness of breath: Secondary | ICD-10-CM | POA: Diagnosis not present

## 2023-06-13 DIAGNOSIS — J9601 Acute respiratory failure with hypoxia: Secondary | ICD-10-CM | POA: Diagnosis not present

## 2023-06-13 DIAGNOSIS — Z91013 Allergy to seafood: Secondary | ICD-10-CM

## 2023-06-13 LAB — RESP PANEL BY RT-PCR (RSV, FLU A&B, COVID)  RVPGX2
Influenza A by PCR: NEGATIVE
Influenza B by PCR: NEGATIVE
Resp Syncytial Virus by PCR: NEGATIVE
SARS Coronavirus 2 by RT PCR: NEGATIVE

## 2023-06-13 LAB — RESPIRATORY PANEL BY PCR

## 2023-06-13 LAB — CBC WITH DIFFERENTIAL/PLATELET
Abs Immature Granulocytes: 0.06 10*3/uL (ref 0.00–0.07)
Basophils Absolute: 0.1 10*3/uL (ref 0.0–0.1)
Basophils Relative: 1 %
Eosinophils Absolute: 0.7 10*3/uL — ABNORMAL HIGH (ref 0.0–0.5)
Eosinophils Relative: 7 %
HCT: 41.4 % (ref 39.0–52.0)
Hemoglobin: 12.4 g/dL — ABNORMAL LOW (ref 13.0–17.0)
Immature Granulocytes: 1 %
Lymphocytes Relative: 27 %
Lymphs Abs: 2.5 10*3/uL (ref 0.7–4.0)
MCH: 23.2 pg — ABNORMAL LOW (ref 26.0–34.0)
MCHC: 30 g/dL (ref 30.0–36.0)
MCV: 77.5 fL — ABNORMAL LOW (ref 80.0–100.0)
Monocytes Absolute: 1.6 10*3/uL — ABNORMAL HIGH (ref 0.1–1.0)
Monocytes Relative: 17 %
Neutro Abs: 4.6 10*3/uL (ref 1.7–7.7)
Neutrophils Relative %: 47 %
Platelets: 271 10*3/uL (ref 150–400)
RBC: 5.34 MIL/uL (ref 4.22–5.81)
RDW: 13.2 % (ref 11.5–15.5)
WBC: 9.4 10*3/uL (ref 4.0–10.5)
nRBC: 0 % (ref 0.0–0.2)

## 2023-06-13 LAB — BASIC METABOLIC PANEL
Anion gap: 13 (ref 5–15)
BUN: 7 mg/dL — ABNORMAL LOW (ref 8–23)
CO2: 28 mmol/L (ref 22–32)
Calcium: 9.2 mg/dL (ref 8.9–10.3)
Chloride: 92 mmol/L — ABNORMAL LOW (ref 98–111)
Creatinine, Ser: 1.09 mg/dL (ref 0.61–1.24)
GFR, Estimated: >60 mL/min (ref >60)
Glucose, Bld: 117 mg/dL — ABNORMAL HIGH (ref 70–99)
Potassium: 4 mmol/L (ref 3.5–5.1)
Sodium: 133 mmol/L — ABNORMAL LOW (ref 135–145)

## 2023-06-13 LAB — I-STAT VENOUS BLOOD GAS, ED
Acid-Base Excess: 7 mmol/L — ABNORMAL HIGH (ref 0.0–2.0)
Bicarbonate: 33.7 mmol/L — ABNORMAL HIGH (ref 20.0–28.0)
Calcium, Ion: 1.12 mmol/L — ABNORMAL LOW (ref 1.15–1.40)
HCT: 43 % (ref 39.0–52.0)
Hemoglobin: 14.6 g/dL (ref 13.0–17.0)
O2 Saturation: 46 %
Potassium: 3.9 mmol/L (ref 3.5–5.1)
Sodium: 135 mmol/L (ref 135–145)
TCO2: 35 mmol/L — ABNORMAL HIGH (ref 22–32)
pCO2, Ven: 52.1 mmHg (ref 44–60)
pH, Ven: 7.419 (ref 7.25–7.43)
pO2, Ven: 25 mmHg — CL (ref 32–45)

## 2023-06-13 LAB — BRAIN NATRIURETIC PEPTIDE: B Natriuretic Peptide: 49.1 pg/mL (ref 0.0–100.0)

## 2023-06-13 LAB — TROPONIN I (HIGH SENSITIVITY)
Troponin I (High Sensitivity): 6 ng/L (ref <18)
Troponin I (High Sensitivity): 5 ng/L (ref <18)

## 2023-06-13 LAB — I-STAT CG4 LACTIC ACID, ED: Lactic Acid, Venous: 1.6 mmol/L (ref 0.5–1.9)

## 2023-06-13 MED ORDER — IPRATROPIUM BROMIDE 0.02 % IN SOLN
0.5000 mg | RESPIRATORY_TRACT | Status: DC
  Administered 2023-06-13 (×2): 0.5 mg via RESPIRATORY_TRACT
  Filled 2023-06-13 (×2): qty 2.5

## 2023-06-13 MED ORDER — AZITHROMYCIN 250 MG PO TABS: 500.0000 mg | ORAL_TABLET | Freq: Every day | ORAL | Status: DC

## 2023-06-13 MED ORDER — SODIUM CHLORIDE 0.9 % IV SOLN
500.0000 mg | INTRAVENOUS | Status: DC
  Administered 2023-06-13: 500 mg via INTRAVENOUS
  Filled 2023-06-13: qty 5

## 2023-06-13 MED ORDER — ENOXAPARIN SODIUM 40 MG/0.4ML IJ SOSY
40.0000 mg | PREFILLED_SYRINGE | INTRAMUSCULAR | Status: DC
  Administered 2023-06-13: 40 mg via SUBCUTANEOUS
  Filled 2023-06-13: qty 0.4

## 2023-06-13 MED ORDER — PREDNISONE 20 MG PO TABS
40.0000 mg | ORAL_TABLET | Freq: Every day | ORAL | Status: DC
Start: 2023-06-14 — End: 2023-06-17
  Administered 2023-06-14 – 2023-06-17 (×4): 40 mg via ORAL
  Filled 2023-06-13 (×4): qty 2

## 2023-06-13 MED ORDER — ARFORMOTEROL TARTRATE 15 MCG/2ML IN NEBU
15.0000 ug | INHALATION_SOLUTION | Freq: Two times a day (BID) | RESPIRATORY_TRACT | Status: DC
  Administered 2023-06-13 – 2023-06-18 (×9): 15 ug via RESPIRATORY_TRACT
  Filled 2023-06-13 (×9): qty 2

## 2023-06-13 MED ORDER — SODIUM CHLORIDE 0.9 % IV SOLN
1.0000 g | INTRAVENOUS | Status: DC
  Administered 2023-06-13: 1 g via INTRAVENOUS
  Filled 2023-06-13: qty 10

## 2023-06-13 MED ORDER — IPRATROPIUM-ALBUTEROL 0.5-2.5 (3) MG/3ML IN SOLN
3.0000 mL | RESPIRATORY_TRACT | Status: DC | PRN
  Administered 2023-06-15 (×2): 3 mL via RESPIRATORY_TRACT
  Filled 2023-06-13 (×2): qty 3

## 2023-06-13 MED ORDER — BUDESONIDE 0.25 MG/2ML IN SUSP
0.2500 mg | Freq: Two times a day (BID) | RESPIRATORY_TRACT | Status: DC
  Administered 2023-06-13 – 2023-06-20 (×13): 0.25 mg via RESPIRATORY_TRACT
  Filled 2023-06-13 (×12): qty 2

## 2023-06-13 MED ORDER — ALBUTEROL SULFATE (2.5 MG/3ML) 0.083% IN NEBU
10.0000 mg/h | INHALATION_SOLUTION | Freq: Once | RESPIRATORY_TRACT | Status: AC
  Administered 2023-06-13: 10 mg/h via RESPIRATORY_TRACT
  Filled 2023-06-13: qty 12

## 2023-06-13 MED ORDER — REVEFENACIN 175 MCG/3ML IN SOLN
175.0000 ug | Freq: Every day | RESPIRATORY_TRACT | Status: DC
  Administered 2023-06-14 – 2023-06-15 (×2): 175 ug via RESPIRATORY_TRACT
  Filled 2023-06-13 (×2): qty 3

## 2023-06-13 MED ORDER — REVEFENACIN 175 MCG/3ML IN SOLN: 175.0000 ug | Freq: Every day | RESPIRATORY_TRACT | Status: DC

## 2023-06-13 NOTE — Telephone Encounter (Signed)
 The patient reported increased shortness of breath with exertion.  His oxygen saturation went down to 84% after moving around in his house.  He said he can't walk 10-20 ft without stopping to catch his breath.  H has to turn up his oxygen to 10 L with walking.  At rest, he has been on 8 L.  He reported that he informed his pulmonologist of this after the last time he had Covid.  He called to request a prescription for two oxygen tanks to be combined since he needs such a high flow.  He has an intermittent productive cough with clear phlegm.  He has been taking Robitussin every 4-6 hours.  He takes his Albuterol nebulizer treatment once in the morning and once in the afternoon.  He uses his Albuterol inhaler 1 puff twice daily and he uses his Symbicort inhaler 1 puff twice daily.  He takes his Spiriva daily but stated that it does not seem to be helping as much anymore.  His baseline oxygen saturation is 92-93%.  When he exerts himself it goes down to the 80's.  It was 84% at the beginning of the call as he was up moving about.  It came up to 92% after resting a few minutes.  He was scheduled for a same day virtual appointment for further evaluation as he does not have transportation to come to the office.   Reason for Disposition  [1] Monitoring oxygen level (e.g., pulse oximetry) AND [2] fall in oxygen level 4% or more (below known patient baseline, while awake and resting)  Answer Assessment - Initial Assessment Questions 1. MAIN CONCERN OR SYMPTOM: "What's your main concern?" (e.g., low oxygen level, breathing difficulty) "What question do you have?"     Shortness of breath  2.  OXYGEN EQUIPMENT:  Are you having trouble with your oxygen equipment?  (e.g., cannula, mask, tubing, tank, concentrator)     tank 3. ONSET: "When did the  shortness of breath  start?"      Worsening over the past 2 weeks  4. OXYGEN THERAPY:    - "Do you currently use home oxygen?" (e.g., yes, no).    - If Yes, ask: "What is  your oxygen source?" (e.g., O2 tank, O2 concentrator).    - If Yes, ask: "How do you get the oxygen?" (e.g., nasal prongs, face mask).    - If Yes, ask: "How much oxygen are you supposed to use?" (e.g., 1-2 L [place])     4 L but at 6 L he almost passes out, using  5. PULSE OXIMETER:    - "Do you have a pulse oximeter (pulse ox)?"  (e.g., yes, no)    - If Yes, ask: "Where do you place the probe?" (e.g., fingertip, ear lobe)     fingertip 6. O2 MONITORING: "What is the oxygen level (pulse ox reading)?" (e.g., 70-100%)     84% on 8 L after moving around Normally 92-93% 7. VSS MONITORING: "Do you monitor/measure your oxygen level or vital signs (e.g., yes, no, measurements are automatically sent to provider/call center). Document CURRENT and NORMAL BASELINE values if available.        HR 84 8. BREATHING DIFFICULTY: "Are you having any difficulty breathing?" If Yes, ask: "How bad is it?"  (e.g., none, mild, moderate, severe)   - MILD: No SOB at rest, mild SOB with walking, speaks normally in sentences, able to lie down, no retractions, pulse < 100.   - MODERATE: SOB at rest,  SOB with minimal exertion and prefers to sit, cannot lie down flat, speaks in phrases, mild retractions, audible wheezing, pulse 100-120.   - SEVERE: Very SOB at rest, speaks in single words, struggling to breathe, sitting hunched forward, retractions, pulse > 120      Increased shortness of breath 9. OTHER SYMPTOMS: "Do you have any other symptoms?" (e.g., fever, change in sputum)     Intermittent productive cough - clear  Protocols used: COPD Oxygen Monitoring and Hypoxia-A-AH

## 2023-06-13 NOTE — Hospital Course (Addendum)
 [name] is a 63 year old man with a past medical history of COPD on 8L oxygen, CKD stage II, hypertension, pulmonary hypertension, and gout who presented to [facility] ED via EMS on 06/13/23 due to respiratory distress and was admitted for acute on Chronic Hypoxic Respiratory Failure.   Acute on Chronic Hypoxic Respiratory Failure 2/2 Non-Covid coronavirus infection COPD on 8L baseline oxygen Pulmonary Hypertension   This patient with end-stage COPD presented with 1 week of progressive dyspnea culminating in respiratory distress with hypoxia.  Workup revealed positive non-covid coronavirus infection and although antibiotics were started at admission, they were discontinued on his first day without evidence of a bacterial pneumonia.   In the ER he was stabilized on Bipap which was successfully weaned after two attempts during his first evening.   Thereafter, he was stable albeit with fluctuating oxygen requirements on HFNC. He was treated for a COPD exacerbation and received sytemic steroids and nebulized LAMA/LABA/ICS.   He demonstrated very poor ability to tolerate activity early in his hospital course. Although often stable near his home oxygen levels at rest, he demonstrated large desaturations and poor tolerance of physical activity. ***   Gout Home Allopurinol   Hypertension His home Amlodipine and Metoprolol were held as not needed   Hyperlipidemia Home Zetia

## 2023-06-13 NOTE — Telephone Encounter (Signed)
 Called 911 for patient and gave them necessary information for transport.  Advised to he will need cardial monitoring and high flow oxygen with non-rebreathing mask.  Nothing further needed.

## 2023-06-13 NOTE — ED Notes (Signed)
 Pt transitioned to high flow cannula per MD. Pt resting in bed tolerating well at this time. With oxygen saturation 91-93%. Call bell in reach and pt educated on when to call for assistance.

## 2023-06-13 NOTE — H&P (Cosign Needed)
 Date: 06/14/2023               Patient Name:  [name] [name] MRN: [id number]  DOB: 1960/09/19 Age / Sex: 63 y.o., male   PCP: Hoy Register, MD         Medical Service: Internal Medicine Teaching Service         Attending Physician: Dr. Mayford Knife, Dorene Ar, MD      First Contact: Dr. Katheran James, DO Pager [phone]    Second Contact: Dr. Rudene Christians, DO Pager [phone]         After Hours (After 5p/  First Contact Pager: [phone]  weekends / holidays): Second Contact Pager: [phone]   SUBJECTIVE   Chief Complaint: Dyspnea, Respiratory Distress  History of Present Illness:  [name] is a 64 year old man with a past medical history of COPD on oxygen, CKD stage II, hypertension, gout who presented to [facility], ED via EMS due to respiratory distress.  He states that over the past week he has been more short of breath and this is slowly progressing.  He has been quite short of breath over the last 3 days.  Today he called his pulmonologist who advised that he go to the ER, so he called EMS.  When EMS found him at his home he was in the tripod position in acute respiratory distress.  His oxygen saturations were in the low 80s on 10 L of home oxygen.  He was placed on BiPAP and given Solu-Medrol.  At his baseline he is on 6 L of oxygen at rest and 8 with ambulation.  Recently he has required 8 at rest and 10 with movement.  Today even with 10 L he was hypoxic to the low 80s.  He does not feel that he is sick, he notes a cough only once per day over the last few days and he is only made sputum once, today, a very small amount of white phlegm.  He does not feel feverish or chills.  He has possible sick contacts with his infant great-grandchildren.  He is taking all of his medicines, including albuterol, Symbicort, Spiriva and does not miss doses.  He is a former smoker who quit 9 months ago.  ED Course: EMS noted patient to be in tripod position at his home, desaturating into the  low 80s on 10 L of home oxygen.  They provided Atrovent, Solu-Medrol, mag sulfate, and placed the patient on BiPAP.  Oxygen saturations improved. In ED, BMP nonrevealing.  CBC does not show leukocytosis.  BNP is not elevated. Troponin not elevated. Respiratory panel negative for Flu, Covid, RSV. VBG unrevealing. No lactic acid elevation. Blood cultures collected. Pt remained on BIPAP and medicine consulted for admission.  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   COPD (chronic obstructive pulmonary disease) (HCC)    on 4L home O2   Essential hypertension    Gout    Thrombocytopenia (HCC) 07/28/2012     Meds:  Current Meds  Medication Sig   albuterol (PROVENTIL) (2.5 MG/3ML) 0.083% nebulizer solution TAKE 3 MLS (2.5 MG TOTAL) BY NEBULIZATION EVERY 6 (SIX) HOURS AS NEEDED FOR SHORTNESS OF BREATH.   albuterol (VENTOLIN HFA) 108 (90 Base) MCG/ACT inhaler Inhale 2 puffs into the lungs every 6 (six) hours as needed for wheezing or shortness of breath   allopurinol (ZYLOPRIM) 100 MG tablet Take 1 tablet (100 mg total)  by mouth daily.   amLODipine (NORVASC) 10 MG tablet Take 1 tablet (10 mg total) by mouth daily.   Ascorbic Acid (VITAMIN C) 1000 MG tablet Take 1,000 mg by mouth daily.   budesonide-formoterol (SYMBICORT) 160-4.5 MCG/ACT inhaler Inhale 2 puffs into the lungs 2 (two) times daily.   ezetimibe (ZETIA) 10 MG tablet Take 1 tablet (10 mg total) by mouth daily.   ibuprofen (ADVIL) 200 MG tablet Take 800 mg by mouth daily as needed (gout swelling).   metoprolol succinate (TOPROL-XL) 50 MG 24 hr tablet Take 1 tablet (50 mg total) by mouth daily.   tiotropium (SPIRIVA HANDIHALER) 18 MCG inhalation capsule PLACE 1 CAPSULE (18 MCG TOTAL) INTO INHALER AND INHALE DAILY.   vitamin B-12 (CYANOCOBALAMIN) 500 MCG tablet Take 500 mcg by mouth daily.   VITAMIN D PO Take 1 tablet by mouth daily.    Past  Surgical History Past Surgical History:  Procedure Laterality Date   FRACTURE SURGERY  childhood   right arm   KNEE SURGERY     right knee s/p trauma    Social:  Lives in: [place] with his daughter and Art gallery manager. 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 Support: Local family. Level of Function: Does not drive, able to groom and do basic tasks at home, family tends to cook for him, limited in ambulation due to his oxygen needs.  He is otherwise independent in ADLs, and IADLs. PCP: Hoy Register, MD Substances: 50-pack-year smoker who quit 9 months ago.  Occasional alcohol, none in a month.  Distant use of substances, over 15 years ago.  Family History:  Noncontributory.  Allergies: Allergies as of 06/13/2023 - Review Complete 06/13/2023  Allergen Reaction Noted   Atorvastatin Itching and Swelling 10/30/2014   Shellfish allergy Anaphylaxis 07/28/2012   Colchicine Hives 10/13/2014    Review of Systems: A complete ROS was negative except as per HPI.   OBJECTIVE:   Physical Exam: Blood pressure 117/78, pulse (!) 123, temperature (!) 97.3 F (36.3 C), temperature source Axillary, resp. rate 20, height 5\' 6"  (1.676 m), weight 92.6 kg, SpO2 96%.  Constitutional: Ill appearing man on Bipap. Conversational. In no acute distress. HENT: Normocephalic, atraumatic,  Eyes: Sclera non-icteric, PERRL, EOM intact Neck:no jvd Cardio:Regular rate and rhythm. No murmurs, rubs, or gallops. 2+ bilateral radial and dorsalis pedis  pulses. Pulm: Lung sounds are distant. No wheezes, rales, ronchi. Increased work of breathing on Bipap. Abdomen: Soft, non-tender, non-distended, positive bowel sounds. NWG:NFAOZ LE edema bilaterally, worse on R. Skin:Warm and dry. Neuro:Alert and oriented x3. No focal deficit noted. Psych:Pleasant mood and affect.  Labs: CBC    Component Value Date/Time   WBC 7.6 06/14/2023 0324   RBC 4.84 06/14/2023 0324   HGB 11.4 (L)  06/14/2023 0324   HGB 12.6 (L) 05/25/2022 1043   HCT 37.2 (L) 06/14/2023 0324   HCT 40.5 05/25/2022 1043   PLT 249 06/14/2023 0324   PLT 235 05/25/2022 1043   MCV 76.9 (L) 06/14/2023 0324   MCV 78 (L) 05/25/2022 1043   MCH 23.6 (L) 06/14/2023 0324   MCHC 30.6 06/14/2023 0324   RDW 13.1 06/14/2023 0324   RDW 12.2 05/25/2022 1043   LYMPHSABS 2.5 06/13/2023 1150   LYMPHSABS 2.2 05/25/2022 1043   MONOABS 1.6 (H) 06/13/2023 1150   EOSABS 0.7 (H) 06/13/2023 1150   EOSABS 0.3 05/25/2022 1043   BASOSABS 0.1 06/13/2023 1150   BASOSABS 0.0 05/25/2022 1043     CMP  Component Value Date/Time   NA 129 (L) 06/14/2023 0324   NA 141 05/25/2022 1043   K 3.9 06/14/2023 0324   CL 93 (L) 06/14/2023 0324   CO2 30 06/14/2023 0324   GLUCOSE 123 (H) 06/14/2023 0324   BUN 11 06/14/2023 0324   BUN 8 05/25/2022 1043   CREATININE 1.14 06/14/2023 0324   CREATININE 1.50 (H) 09/17/2015 0955   CALCIUM 8.7 (L) 06/14/2023 0324   PROT 6.4 05/25/2022 1043   ALBUMIN 3.7 (L) 05/25/2022 1043   AST 19 05/25/2022 1043   ALT 32 05/25/2022 1043   ALKPHOS 69 05/25/2022 1043   BILITOT 0.9 05/25/2022 1043   GFRNONAA >60 06/14/2023 0324   GFRNONAA 51 (L) 09/17/2015 0955   GFRAA >60 10/30/2018 0703   GFRAA 59 (L) 09/17/2015 0955   BNP 49 Troponin 6 Resp Panel negative for Covid, Flu, RSV Lactic Acid 1.6  Imaging: CXR with emphysematous disease, bibasilar subsegmental atelectasis, possible R lobe infiltrate.  EKG: personally reviewed my interpretation is sinus rhythm with PACs consistent with prior EKG.  ASSESSMENT & PLAN:   Assessment & Plan by Problem: Principal Problem:   Acute on chronic hypoxic respiratory failure (HCC) Active Problems:   COPD with acute exacerbation (HCC)   Viral respiratory infection   Mr. Hager Compston is a 63 year old man with a past medical history of COPD on oxygen, CKD stage II, hypertension, gout who presented to [facility], ED via EMS due to respiratory distress and  admitted for acute on Chronic Hypoxic Respiratory Failure. Today is hospital Day 0.  Acute on Chronic Hypoxic Respiratory Failure COPD Exacerbation 6L baseline oxygen requirement This patient is presenting with 1 week of progressive dyspnea culminating in respiratory distress today.  Notably he was found in the tripod position at his home desaturating into the low 80s with 10 L of oxygen, an increase from a baseline of 6 L.  He has an extensive smoking history, over 50 pack years, and he quit about 9 months ago.  He does have home inhalers that he has not missed.     He denies other symptoms of acute respiratory infection such as fevers, chills, and he only reports a very mild cough, reportedly once per day with only 1 episode of white phlegm today.  He does not have wheezes on his exam but his lungs are extremely distant.  On his x-ray is possible developing right lower lobe infiltrate.  He notes that his infant great granddaughters have been sick.  We did try to take him off BiPAP shortly after admission high flow nasal cannula.  Unfortunately with increased work of breathing he needed return to BiPAP.   We had an extensive discussion regarding his CODE STATUS.  Patient was unsure about this discussion at first and asked to include his daughter and the discussion by phone.  In the end it was decided that he would be full code and would pursue intubation if necessary.   For now, we will treat for CAP and COPD exacerbation.  Respiratory status is tenuous at this moment necessitating return to BiPAP after failing to wean to heated high flow.  It is possible that he will require intubation. -Received Solu-Medrol, magnesium per EMS -Start prednisone 40 daily, Atrovent nebulizers every 4 hours, arformoterol nebs twice daily -Treatment with ceftriaxone and azithromycin -Full respiratory viral panel -Follow-up blood cultures -Attempt to wean off BiPAP as able  Gout Resume Allopurinol 100 daily when off  Bipap  Hypertension Hold home Amlodipine 10 daily,  Metoprolol succinate 50 daily, resume when appropriate and off Bipap  Hyperlipidemia Resume Zetia 10 daily when off Bipap   Diet: NPO VTE: Enoxaparin IVF: None,None Code: Full  Dispo: Admit patient to Inpatient with expected length of stay greater than 2 midnights.  Signed: Dr. Katheran James, DO Pager [phone]  After hours please call [phone] Internal Medicine Resident PGY-1 06/14/2023, 4:17 PM

## 2023-06-13 NOTE — Progress Notes (Signed)
 8PM: Examined patient at beside for re-evaluation after second 4-HR period on BiPAP. RN at bedside, patient was on toilet; attempted transitioning off to HF [place] but patient desaturated <89 with 15L with increased WOB.   9:30 PM Re-evaluated patient after nebulized treatments while on BiPAP. He was resting in bed comfortably and breathing without accessory muscle use. No wheezing on auscultation. Discussed attempt to wean to HFNC and he agreed.  10:24 PM: Patient on 12L HFNC 92% saturation, resting comfortable in bed. Added ICS inhaler as a nebulized treatment and changed duonebs to PRN. Discussed with nurse for continuity of care  Morene Crocker, MD

## 2023-06-13 NOTE — ED Triage Notes (Signed)
 BIB [facility] from home for [facility] that started about two weeks ago. Patient has a history of COPD, emphysema. Patient wears 8L [place] baseline at home. Patient was at 84% on 8L [place] patient was put on CPAP with peak flow of 5 and that brought him up to 91%.    1 neb, 125mg  solumedrol and magnesium given en route.   Per [facility] patient lung sounds were diminished in all lobes after treatment patient right lung sounds heard.

## 2023-06-13 NOTE — Progress Notes (Signed)
 Patient ID: [name] [name], male     DOB: 1960/12/09, 63 y.o.      MRN: [id number]  No chief complaint on file.   Virtual Visit via Video Note  I connected with [name] [name] on 06/13/23 at 10:30 AM EST by a video enabled telemedicine application and verified that I am speaking with the correct person using two identifiers.  Location: Patient: Home Provider: Office    I discussed the limitations of evaluation and management by telemedicine and the availability of in person appointments. The patient expressed understanding and agreed to proceed.  History of Present Illness: 63 year old male, former smoker followed for COPD, PH and chronic respiratory failure. He is a patient of Dr. Isaiah Serge and last seen in office 02/28/2023. Past medical history significant for HTN, CKD, hx of cocaine abuse, HLD, gout.   TEST/EVENTS: 07/22/2015 PFT: FVC 57, FEV1 36, ratio 49, DLCOcor 37 10/30/2018 CT chest w contrast: emphysematous disease. Minimal right posterior basilar subsegmental atelectasis. RML atelectasis. Minimal left basilar atelectasis.  05/10/2022 CXR: bibasilar airspace opacities; emphysema  05/11/2022 echo: EF 60-65%. RV size and function nl. Mildly elevated PASP.   02/28/2023: OV with Dr. Isaiah Serge. Admitted in January 2024 for COVID pna. Initially on high flow  but weaned to 6 lpm at discharge. Follow up today. Breathing is not good but managing with Symbicort and Spiriva. Quit smoking 2020. Very cautions about infection control, using Lysol and sanitizer regularly. Lung exam clear. Declined LDCT LCS program. PH Group 2/3. Workup for alternative etiologies negative. Required 6 lpm supplemental O2 at rest and 8 lpm with exertion, 97% on this.  06/13/2023: Today - acute Patient presents today for acute visit, scheduled by [facility] triage group after calling in with reports of increased DOE and hypoxia. He has been unable to walk 10-20 ft without having to stop to catch his breath. He's had to turn his oxygen  up to 10 lpm with exertion. Intermittent productive cough with clear sputum. He tells me that the issues with his breathing started last week. He's not had any known sick exposures. He's concentrator only goes up to 10 lpm and O2 levels are still dropping into the 80's on this. He denies any hemoptysis, calf pain, chest pain, lower extremity swelling, fevers.     Allergies  Allergen Reactions   Atorvastatin Itching and Swelling    Facial, tongue swelling   Shellfish Allergy Anaphylaxis   Colchicine Hives   Immunization History  Administered Date(s) Administered   Influenza,inj,Quad PF,6+ Mos 12/22/2013   Pneumococcal Conjugate-13 12/22/2013   Pneumococcal Polysaccharide-23 07/30/2012   Past Medical History:  Diagnosis Date   Asthma 04/10/1997   CKD (chronic kidney disease), 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   COPD (chronic obstructive pulmonary disease) (HCC)    on 4L home O2   Essential hypertension    Gout    Thrombocytopenia (HCC) 07/28/2012    Tobacco History: Social History   Tobacco Use  Smoking Status Former   Current packs/day: 0.00   Average packs/day: 1 pack/day for 45.0 years (45.0 ttl pk-yrs)   Types: Cigarettes   Start date: 19   Quit date: 2021   Years since quitting: 4.1  Smokeless Tobacco Never  Tobacco Comments   'Quitting"   Counseling given: Not Answered Tobacco comments: 'Quitting"   Outpatient Medications Prior to Visit  Medication Sig Dispense Refill   albuterol (PROVENTIL) (2.5 MG/3ML) 0.083% nebulizer solution TAKE 3 MLS (2.5 MG TOTAL) BY NEBULIZATION EVERY 6 (SIX) HOURS AS NEEDED FOR SHORTNESS  OF BREATH. 360 mL 0   albuterol (VENTOLIN HFA) 108 (90 Base) MCG/ACT inhaler Inhale 2 puffs into the lungs every 6 (six) hours as needed for wheezing or shortness of breath 18 g 2   allopurinol (ZYLOPRIM) 100 MG tablet Take 1 tablet (100 mg total) by mouth daily. 90 tablet 0   amLODipine (NORVASC) 10 MG tablet Take 1 tablet (10 mg total) by mouth daily. 90 tablet  0   Apremilast (OTEZLA) 30 MG TABS Take 30 mg by mouth 2 (two) times daily.     Ascorbic Acid (VITAMIN C) 1000 MG tablet Take 1,000 mg by mouth daily.     budesonide-formoterol (SYMBICORT) 160-4.5 MCG/ACT inhaler Inhale 2 puffs into the lungs 2 (two) times daily. 10.2 g 5   diphenhydrAMINE (BENADRYL) 25 MG tablet Take 50 mg by mouth daily as needed (allergies).     ezetimibe (ZETIA) 10 MG tablet Take 1 tablet (10 mg total) by mouth daily. 90 tablet 0   guaiFENesin-dextromethorphan (ROBITUSSIN DM) 100-10 MG/5ML syrup Take 10 mLs by mouth every 4 (four) hours as needed for cough. (Patient not taking: Reported on 02/28/2023) 118 mL 0   hydrocortisone 1 % ointment Apply 1 application topically 2 (two) times daily. 30 g 0   ibuprofen (ADVIL) 200 MG tablet Take 800 mg by mouth daily as needed (gout swelling).     metoprolol succinate (TOPROL-XL) 50 MG 24 hr tablet Take 1 tablet (50 mg total) by mouth daily. 90 tablet 0   tiotropium (SPIRIVA HANDIHALER) 18 MCG inhalation capsule PLACE 1 CAPSULE (18 MCG TOTAL) INTO INHALER AND INHALE DAILY. 30 capsule 0   vitamin B-12 (CYANOCOBALAMIN) 500 MCG tablet Take 500 mcg by mouth daily.     No facility-administered medications prior to visit.     Review of Systems:   Constitutional: No weight loss or gain, night sweats, fevers, chills +fatigue, lassitude. HEENT: No headaches, difficulty swallowing, tooth/dental problems, or sore throat. No sneezing, itching, ear ache, nasal congestion, or post nasal drip CV:  No chest pain, orthopnea, PND, swelling in lower extremities, anasarca, dizziness, palpitations, syncope Resp: +shortness of breath with exertion; productive cough. No hemoptysis. No wheezing.  No chest wall deformity GI:  No heartburn, indigestion, abdominal pain, nausea, vomiting, diarrhea, change in bowel habits, loss of appetite, bloody stools.  Neuro: No gait change or memory impairment  Psych: No depression. +anxiety (stable). Mood stable.    Observations/Objective: Patient is well-developed, well-nourished in mild respiratory distress, improves with rest. Able to speak in complete sentences. Speech is clear and coherent with logical content. Patient is alert and oriented at baseline.  O2 saturations on 10 lpm 84-86%, recovered to 91% with rest  Assessment and Plan: Acute on chronic respiratory failure with hypoxia (HCC) Acute on chronic hypoxemic respiratory failure. Underlying etiology unclear. Possibly AECOPD but unable to assess via virtual visit. He's unable to maintain oxygen levels on 10 lpm even with minimal activity such as moving from one side of the room to the other. Given the severity of his lung disease, worsening hypoxia, and high risk for decompensation, needs further evaluation in the ED and will likely need admission to the hospital. Pt verbalized understanding. EMS was contacted by Joline Maxcy, RN in office (see telephone encounter) and directed to pt's home.   I discussed the assessment and treatment plan with the patient. The patient was provided an opportunity to ask questions and all were answered. The patient agreed with the plan and demonstrated an understanding of the  instructions.   The patient was advised to call back or seek an in-person evaluation if the symptoms worsen or if the condition fails to improve as anticipated.  I provided 35 minutes of non-face-to-face time during this encounter.   Noemi Chapel, NP

## 2023-06-13 NOTE — ED Notes (Signed)
 Attempted to place pt on 15L high flow  per MD Felipa Furnace. Pt able to tolerate approx five minutes and then became short of breath and oxygen sat dropped to 80%. Pt placed back on bi-pap. Pt improved.

## 2023-06-13 NOTE — Assessment & Plan Note (Signed)
 Acute on chronic hypoxemic respiratory failure. Underlying etiology unclear. Possibly AECOPD but unable to assess via virtual visit. He's unable to maintain oxygen levels on 10 lpm even with minimal activity such as moving from one side of the room to the other. Given the severity of his lung disease, worsening hypoxia, and high risk for decompensation, needs further evaluation in the ED and will likely need admission to the hospital. Pt verbalized understanding. EMS was contacted by Joline Maxcy, RN in office (see telephone encounter) and directed to pt's home.

## 2023-06-13 NOTE — ED Provider Notes (Signed)
 [place] EMERGENCY DEPARTMENT AT [facility] Provider Note   CSN: [id number] Arrival date & time: 06/13/23  1143     History  Chief Complaint  Patient presents with   Shortness of Breath    [name] [name] is a 63 y.o. male.  He is brought in by EMS after respiratory distress at home.  He has a history of COPD on 8 L nasal cannula 24/7.  He has been more short of breath for the last 2 weeks.  EMS found his pulse ox in the 80s, placed him on CPAP.  Also received Solu-Medrol magnesium and breathing treatments.  He arrives with continued dyspnea.  The history is provided by the patient and the EMS personnel.  Shortness of Breath Severity:  Severe Onset quality:  Gradual Duration:  2 weeks Timing:  Constant Progression:  Worsening Chronicity:  Recurrent Relieved by:  Nothing Associated symptoms: cough        Home Medications Prior to Admission medications   Medication Sig Start Date End Date Taking? Authorizing Provider  albuterol (PROVENTIL) (2.5 MG/3ML) 0.083% nebulizer solution TAKE 3 MLS (2.5 MG TOTAL) BY NEBULIZATION EVERY 6 (SIX) HOURS AS NEEDED FOR SHORTNESS OF BREATH. 09/26/22   Hoy Register, MD  albuterol (VENTOLIN HFA) 108 (90 Base) MCG/ACT inhaler Inhale 2 puffs into the lungs every 6 (six) hours as needed for wheezing or shortness of breath 03/27/23   Hoy Register, MD  allopurinol (ZYLOPRIM) 100 MG tablet Take 1 tablet (100 mg total) by mouth daily. 05/23/23   Hoy Register, MD  amLODipine (NORVASC) 10 MG tablet Take 1 tablet (10 mg total) by mouth daily. 05/23/23   Hoy Register, MD  Apremilast (OTEZLA) 30 MG TABS Take 30 mg by mouth 2 (two) times daily.    [provider]  Ascorbic Acid (VITAMIN C) 1000 MG tablet Take 1,000 mg by mouth daily.    [provider]  budesonide-formoterol (SYMBICORT) 160-4.5 MCG/ACT inhaler Inhale 2 puffs into the lungs 2 (two) times daily. 06/12/23   Mannam, Colbert Coyer, MD  diphenhydrAMINE (BENADRYL) 25 MG  tablet Take 50 mg by mouth daily as needed (allergies).    [provider]  ezetimibe (ZETIA) 10 MG tablet Take 1 tablet (10 mg total) by mouth daily. 05/23/23   Hoy Register, MD  guaiFENesin-dextromethorphan (ROBITUSSIN DM) 100-10 MG/5ML syrup Take 10 mLs by mouth every 4 (four) hours as needed for cough. Patient not taking: Reported on 02/28/2023 12/14/20   Joycelyn Das, MD  hydrocortisone 1 % ointment Apply 1 application topically 2 (two) times daily. 12/30/18   Hoy Register, MD  ibuprofen (ADVIL) 200 MG tablet Take 800 mg by mouth daily as needed (gout swelling).    [provider]  metoprolol succinate (TOPROL-XL) 50 MG 24 hr tablet Take 1 tablet (50 mg total) by mouth daily. 05/23/23   Hoy Register, MD  tiotropium (SPIRIVA HANDIHALER) 18 MCG inhalation capsule PLACE 1 CAPSULE (18 MCG TOTAL) INTO INHALER AND INHALE DAILY. 05/22/23   Hoy Register, MD  vitamin B-12 (CYANOCOBALAMIN) 500 MCG tablet Take 500 mcg by mouth daily.    [provider]  ALBUTEROL IN Inhale into the lungs.  07/03/11  [provider]      Allergies    Atorvastatin, Shellfish allergy, and Colchicine    Review of Systems   Review of Systems  Respiratory:  Positive for cough and shortness of breath.     Physical Exam Updated Vital Signs BP (!) 153/87   Pulse (!) 110  Temp (!) 97.3 F (36.3 C) (Axillary)   Resp (!) 23   Ht 5\' 6"  (1.676 m)   Wt 92.6 kg   SpO2 100%   BMI 32.95 kg/m  Physical Exam Vitals and nursing note reviewed.  Constitutional:      General: He is in acute distress.     Appearance: He is well-developed.  HENT:     Head: Normocephalic and atraumatic.  Eyes:     Conjunctiva/sclera: Conjunctivae normal.  Cardiovascular:     Rate and Rhythm: Normal rate and regular rhythm.     Heart sounds: No murmur heard. Pulmonary:     Effort: Tachypnea, accessory muscle usage and respiratory distress present.     Breath sounds: Decreased breath sounds  present.  Abdominal:     Palpations: Abdomen is soft.     Tenderness: There is no abdominal tenderness.  Musculoskeletal:        General: No swelling. Normal range of motion.     Cervical back: Neck supple.     Right lower leg: No edema.     Left lower leg: No edema.  Skin:    General: Skin is warm and dry.     Capillary Refill: Capillary refill takes less than 2 seconds.  Neurological:     General: No focal deficit present.     Mental Status: He is alert.     ED Results / Procedures / Treatments   Labs (all labs ordered are listed, but only abnormal results are displayed) Labs Reviewed  BASIC METABOLIC PANEL - Abnormal; Notable for the following components:      Result Value   Sodium 133 (*)    Chloride 92 (*)    Glucose, Bld 117 (*)    BUN 7 (*)    All other components within normal limits  CBC WITH DIFFERENTIAL/PLATELET - Abnormal; Notable for the following components:   Hemoglobin 12.4 (*)    MCV 77.5 (*)    MCH 23.2 (*)    Monocytes Absolute 1.6 (*)    Eosinophils Absolute 0.7 (*)    All other components within normal limits  I-STAT VENOUS BLOOD GAS, ED - Abnormal; Notable for the following components:   pO2, Ven 25 (*)    Bicarbonate 33.7 (*)    TCO2 35 (*)    Acid-Base Excess 7.0 (*)    Calcium, Ion 1.12 (*)    All other components within normal limits  RESP PANEL BY RT-PCR (RSV, FLU A&B, COVID)  [id number]  CULTURE, BLOOD (ROUTINE X 2)  CULTURE, BLOOD (ROUTINE X 2)  RESPIRATORY PANEL BY PCR  BRAIN NATRIURETIC PEPTIDE  I-STAT CG4 LACTIC ACID, ED  TROPONIN I (HIGH SENSITIVITY)  TROPONIN I (HIGH SENSITIVITY)    EKG EKG Interpretation Date/Time:  Wednesday June 13 2023 11:50:43 EST Ventricular Rate:  90 PR Interval:  158 QRS Duration:  102 QT Interval:  345 QTC Calculation: 423 R Axis:   53  Text Interpretation: Sinus rhythm Atrial premature complexes Consider left atrial enlargement Borderline repolarization abnormality Artifact in lead(s) I II aVR aVL  ST depressions nonspecific compared with 1/24 Confirmed by Meridee Score [phone]) on 06/13/2023 11:53:11 AM  Radiology DG Chest Port 1 View Result Date: 06/13/2023 CLINICAL DATA:  Shortness of breath. EXAM: PORTABLE CHEST 1 VIEW COMPARISON:  May 10, 2022. FINDINGS: The heart size and mediastinal contours are within normal limits. Emphysematous disease is noted bilaterally. Bibasilar opacities are noted concerning for subsegmental atelectasis or possibly scarring. The visualized skeletal structures are unremarkable.  IMPRESSION: Emphysematous disease is again noted. Bibasilar subsegmental atelectasis or scarring is again noted. Emphysema (ICD10-J43.9). Electronically Signed   By: Lupita Raider M.D.   On: 06/13/2023 15:01    Procedures .Critical Care  Performed by: Terrilee Files, MD Authorized by: Terrilee Files, MD   Critical care provider statement:    Critical care time (minutes):  45   Critical care time was exclusive of:  Separately billable procedures and treating other patients   Critical care was necessary to treat or prevent imminent or life-threatening deterioration of the following conditions:  Respiratory failure   Critical care was time spent personally by me on the following activities:  Development of treatment plan with patient or surrogate, discussions with consultants, evaluation of patient's response to treatment, examination of patient, obtaining history from patient or surrogate, ordering and performing treatments and interventions, ordering and review of laboratory studies, ordering and review of radiographic studies, pulse oximetry, re-evaluation of patient's condition and review of old charts   I assumed direction of critical care for this patient from another provider in my specialty: no       Medications Ordered in ED Medications  enoxaparin (LOVENOX) injection 40 mg (40 mg Subcutaneous Given 06/13/23 1521)  ipratropium (ATROVENT) nebulizer solution 0.5 mg (0.5  mg Nebulization Given 06/13/23 1521)  arformoterol (BROVANA) nebulizer solution 15 mcg (has no administration in time range)  cefTRIAXone (ROCEPHIN) 1 g in sodium chloride 0.9 % 100 mL IVPB (1 g Intravenous New Bag/Given 06/13/23 1611)  predniSONE (DELTASONE) tablet 40 mg (has no administration in time range)  azithromycin (ZITHROMAX) 500 mg in sodium chloride 0.9 % 250 mL IVPB (500 mg Intravenous New Bag/Given 06/13/23 1612)  albuterol (PROVENTIL) (2.5 MG/3ML) 0.083% nebulizer solution (10 mg/hr Nebulization Given 06/13/23 1204)    ED Course/ Medical Decision Making/ A&P Clinical Course as of 06/13/23 1642  Wed Jun 13, 2023  1203 Patient placed on BiPAP on arrival.  Seems to be a little bit more comfortable without.  Sats in the mid 90s.  Chest x-ray interpreted by me as possible right lower lobe infiltrate.  Awaiting radiology reading. [MB]  1328 Discussed with internal medicine teaching service who will evaluate patient for admission. [MB]    Clinical Course User Index [MB] Terrilee Files, MD                                 Medical Decision Making Amount and/or Complexity of Data Reviewed Labs: ordered. Radiology: ordered.  Risk Prescription drug management. Decision regarding hospitalization.   This patient complains of shortness of breath; this involves an extensive number of treatment Options and is a complaint that carries with it a high risk of complications and morbidity. The differential includes COPD, CHF, pneumothorax, PE, COVID, flu, pneumonia  I ordered, reviewed and interpreted labs, which included CBC with normal white count, low stable hemoglobin, chemistries unremarkable, VBG without significant acidosis, COVID and flu negative, lactate normal, blood culture sent I ordered medication BiPAP and inhalational treatments and reviewed PMP when indicated. I ordered imaging studies which included chest x-ray and I independently    visualized and interpreted imaging which  showed possible right lower lobe Additional history obtained from EMS Previous records obtained and reviewed in epic including previous ED visits I consulted internal medicine teaching service and discussed lab and imaging findings and discussed disposition.  Cardiac monitoring reviewed, sinus tachycardia Social determinants considered, no significant  barriers Critical Interventions: Initiation and management of BiPAP and workup for hypoxia  After the interventions stated above, I reevaluated the patient and found patient to be breathing more comfortably and appropriate for admission Admission and further testing considered, patient will be admitted to the hospital for further respiratory management.  He is agreeable plan for admission.         Final Clinical Impression(s) / ED Diagnoses Final diagnoses:  Acute respiratory failure with hypoxia (HCC)  COPD exacerbation (HCC)    Rx / DC Orders ED Discharge Orders     None         Terrilee Files, MD 06/13/23 1645

## 2023-06-14 ENCOUNTER — Inpatient Hospital Stay (HOSPITAL_COMMUNITY)

## 2023-06-14 DIAGNOSIS — B9789 Other viral agents as the cause of diseases classified elsewhere: Secondary | ICD-10-CM | POA: Diagnosis present

## 2023-06-14 DIAGNOSIS — E871 Hypo-osmolality and hyponatremia: Secondary | ICD-10-CM | POA: Diagnosis not present

## 2023-06-14 DIAGNOSIS — R0609 Other forms of dyspnea: Secondary | ICD-10-CM | POA: Diagnosis not present

## 2023-06-14 DIAGNOSIS — J449 Chronic obstructive pulmonary disease, unspecified: Secondary | ICD-10-CM | POA: Diagnosis not present

## 2023-06-14 DIAGNOSIS — I272 Pulmonary hypertension, unspecified: Secondary | ICD-10-CM | POA: Diagnosis not present

## 2023-06-14 DIAGNOSIS — J9621 Acute and chronic respiratory failure with hypoxia: Secondary | ICD-10-CM | POA: Diagnosis not present

## 2023-06-14 LAB — CBC
HCT: 37.2 % — ABNORMAL LOW (ref 39.0–52.0)
Hemoglobin: 11.4 g/dL — ABNORMAL LOW (ref 13.0–17.0)
MCH: 23.6 pg — ABNORMAL LOW (ref 26.0–34.0)
MCHC: 30.6 g/dL (ref 30.0–36.0)
MCV: 76.9 fL — ABNORMAL LOW (ref 80.0–100.0)
Platelets: 249 10*3/uL (ref 150–400)
RBC: 4.84 MIL/uL (ref 4.22–5.81)
RDW: 13.1 % (ref 11.5–15.5)
WBC: 7.6 10*3/uL (ref 4.0–10.5)
nRBC: 0 % (ref 0.0–0.2)

## 2023-06-14 LAB — BLOOD CULTURE ID PANEL (REFLEXED) - BCID2

## 2023-06-14 LAB — BASIC METABOLIC PANEL
Anion gap: 6 (ref 5–15)
BUN: 11 mg/dL (ref 8–23)
CO2: 30 mmol/L (ref 22–32)
Calcium: 8.7 mg/dL — ABNORMAL LOW (ref 8.9–10.3)
Chloride: 93 mmol/L — ABNORMAL LOW (ref 98–111)
Creatinine, Ser: 1.14 mg/dL (ref 0.61–1.24)
GFR, Estimated: >60 mL/min (ref >60)
Glucose, Bld: 123 mg/dL — ABNORMAL HIGH (ref 70–99)
Potassium: 3.9 mmol/L (ref 3.5–5.1)
Sodium: 129 mmol/L — ABNORMAL LOW (ref 135–145)

## 2023-06-14 LAB — ECHOCARDIOGRAM COMPLETE
Area-P 1/2: 3.99 cm2
Height: 66 in
S' Lateral: 2.5 cm
Weight: 3266.34 [oz_av]

## 2023-06-14 LAB — HIV ANTIBODY (ROUTINE TESTING W REFLEX): HIV Screen 4th Generation wRfx: NONREACTIVE

## 2023-06-14 MED ORDER — ALUM & MAG HYDROXIDE-SIMETH 200-200-20 MG/5ML PO SUSP
30.0000 mL | Freq: Once | ORAL | Status: AC
  Administered 2023-06-14: 30 mL via ORAL
  Filled 2023-06-14: qty 30

## 2023-06-14 MED ORDER — SENNOSIDES-DOCUSATE SODIUM 8.6-50 MG PO TABS
2.0000 | ORAL_TABLET | Freq: Once | ORAL | Status: AC
  Administered 2023-06-14: 2 via ORAL
  Filled 2023-06-14: qty 2

## 2023-06-14 MED ORDER — GUAIFENESIN-DM 100-10 MG/5ML PO SYRP
5.0000 mL | ORAL_SOLUTION | ORAL | Status: DC | PRN
  Administered 2023-06-14 – 2023-06-20 (×13): 5 mL via ORAL
  Filled 2023-06-14 (×14): qty 5

## 2023-06-14 MED ORDER — RIVAROXABAN 10 MG PO TABS
10.0000 mg | ORAL_TABLET | Freq: Every day | ORAL | Status: DC
  Administered 2023-06-14 – 2023-06-20 (×7): 10 mg via ORAL
  Filled 2023-06-14 (×7): qty 1

## 2023-06-14 MED ORDER — POLYETHYLENE GLYCOL 3350 17 G PO PACK
17.0000 g | PACK | Freq: Every day | ORAL | Status: DC
Start: 2023-06-14 — End: 2023-06-20
  Administered 2023-06-15 – 2023-06-16 (×2): 17 g via ORAL
  Filled 2023-06-14 (×6): qty 1

## 2023-06-14 MED ORDER — SENNOSIDES-DOCUSATE SODIUM 8.6-50 MG PO TABS: 1.0000 | ORAL_TABLET | Freq: Every day | ORAL | Status: DC

## 2023-06-14 MED ORDER — ALLOPURINOL 100 MG PO TABS
100.0000 mg | ORAL_TABLET | Freq: Every day | ORAL | Status: DC
  Administered 2023-06-14 – 2023-06-20 (×7): 100 mg via ORAL
  Filled 2023-06-14 (×7): qty 1

## 2023-06-14 MED ORDER — SENNOSIDES-DOCUSATE SODIUM 8.6-50 MG PO TABS
1.0000 | ORAL_TABLET | Freq: Every day | ORAL | Status: DC
  Administered 2023-06-15: 1 via ORAL
  Filled 2023-06-14: qty 1

## 2023-06-14 MED ORDER — ACETAMINOPHEN 325 MG PO TABS
650.0000 mg | ORAL_TABLET | Freq: Four times a day (QID) | ORAL | Status: DC | PRN
Start: 2023-06-14 — End: 2023-06-20

## 2023-06-14 NOTE — Progress Notes (Addendum)
 HD#1 SUBJECTIVE:  Patient Summary: [name] is a 63 year old man with a past medical history of COPD on oxygen, CKD stage II, hypertension, gout who presented to [facility] ED via EMS due to respiratory distress and admitted for acute on Chronic Hypoxic Respiratory Failure 2/2 non-Covid coronavirus infection. Today is hospital Day 1.  Overnight Events and Interim History: Weaned off Bipap overnight. Presently 12L HFNC. Pt states that he is feeling well. He remains short of breath with movement but feels well at rest. He does not have respiratory distress or discomfort. He is in good spirits. He has no fevers, chills, cough.  Significant Hospital Events:  3/5 Admission for AoC Hypoxic RF. Placed then weaned from Bipap to HFNC.  OBJECTIVE:  Vital Signs: Vitals:   06/14/23 1146 06/14/23 1200 06/14/23 1333 06/14/23 1508  BP:  122/77 131/71 (!) 135/91  Pulse:  82 (!) 101 93  Resp:  (!) 23 16 19   Temp: 98.7 F (37.1 C)  98.2 F (36.8 C) 98.1 F (36.7 C)  TempSrc: Oral  Oral Oral  SpO2:  100% 91% 96%  Weight:      Height:       Supplemental O2: HFNC SpO2: 96 % O2 Flow Rate (L/min): 6 L/min FiO2 (%): 80 %  Filed Weights   06/13/23 1156  Weight: 92.6 kg     Intake/Output Summary (Last 24 hours) at 06/14/2023 1631 Last data filed at 06/14/2023 1356 Gross per 24 hour  Intake 240 ml  Output 400 ml  Net -160 ml   Net IO Since Admission: -160 mL [06/14/23 1631]  Physical Exam: Physical Exam Constitutional:      General: He is not in acute distress.    Appearance: He is well-developed. He is not ill-appearing.     Comments: Wearing HFNC.  Cardiovascular:     Rate and Rhythm: Normal rate and regular rhythm.  Pulmonary:     Effort: Pulmonary effort is normal. No tachypnea, accessory muscle usage or respiratory distress.     Breath sounds: Examination of the right-upper field reveals decreased breath sounds. Examination of the left-upper field reveals decreased breath  sounds. Examination of the right-middle field reveals decreased breath sounds. Examination of the left-middle field reveals decreased breath sounds. Decreased breath sounds present. No wheezing.     Comments: Clear lungs sounds in the bilateral bases. Decreased sounds in mid/upper lungs. Skin:    General: Skin is warm and dry.  Neurological:     General: No focal deficit present.     Mental Status: He is alert and oriented to person, place, and time.  Psychiatric:        Mood and Affect: Mood normal.        Behavior: Behavior normal.     Patient Lines/Drains/Airways Status     Active Line/Drains/Airways     Name Placement date Placement time Site Days   Peripheral IV 06/13/23 18 G Left Antecubital 06/13/23  --  Antecubital  1   Peripheral IV 06/13/23 18 G Right Forearm 06/13/23  --  Forearm  1             ASSESSMENT/PLAN:  Assessment: Principal Problem:   Acute on chronic hypoxic respiratory failure (HCC) Active Problems:   COPD with acute exacerbation (HCC)   Viral respiratory infection  [name] is a 63 year old man with a past medical history of COPD on oxygen, CKD stage II, hypertension, gout who presented to [facility] ED via EMS due to respiratory  distress and admitted for acute on Chronic Hypoxic Respiratory Failure 2/2 non-Covid coronavirus infection. Today is hospital Day 1.  Plan: Acute on Chronic Hypoxic Respiratory Failure 2/2 Non-Covid Coronavirus infection COPD on 6L baseline oxygen requirement This patient has end stage COPD presenting with 1 week of progressive dyspnea culminating in respiratory distress. Cause appears to be from non-Covid coronavirus infection, will stop ABx. Note: baseline oxygen is 6L at rest, 8L with activity, presently he requires 12L at rest and desaturates with movement. Overnight he was able to transition from Bipap to HFNC, appreciate his night doctors. Pt is in favor of intubation should his respiratory status worsen. -  Echo today - Stop CAP abx, pt has non-covid coronavirus infection - Prednisone 40 every day, day 1/5 - Scheduled LAMA/LABA/ICS inhalers and Duonebs prn - Follow-up blood cultures - Wean O2 as able, goal O2 88-93% - PT/OT   Hyponatremia 129 this morning which is down from admission. Will monitor for now. Asymptomatic.  ~  Chronic Issues Gout Resume Allopurinol 100 daily Hypertension Hold home Amlodipine 10 daily, Metoprolol succinate 50 daily, resume when appropriate and off Bipap. Bps fine right now. Hyperlipidemia Resume Zetia 10 daily when off Bipap   Best Practice: Diet: Regular diet IVF: None VTE: rivaroxaban (XARELTO) tablet 10 mg Start: 06/14/23 1045 Code: Full AB: None Pain Medicine: Tylenol prn Bowel Regimen: Miralax qd Therapy Recs: pending DISPO: Anticipated discharge in 1-3 days to  TBD  pending  treatment and eval .  Signature: Katheran James, D.O.  Internal Medicine Resident, PGY-1 [facility]  Pager: [phone] 4:31 PM, 06/14/2023   Please contact the on call pager after 5 pm and on weekends at [phone].

## 2023-06-14 NOTE — Progress Notes (Signed)
 Pt arrived from ..ED..., A/ox .4.Marland Kitchenpt denies any pain, MD aware,CCMD called. CHG bath given,no further needs at this time

## 2023-06-14 NOTE — Progress Notes (Signed)
 Echocardiogram 2D Echocardiogram has been performed.  [name] [name] 06/14/2023, 11:28 AM

## 2023-06-14 NOTE — Progress Notes (Signed)
 PHARMACY - PHYSICIAN COMMUNICATION CRITICAL VALUE ALERT - BLOOD CULTURE IDENTIFICATION (BCID)  [name] [name] is an 63 y.o. male who presented to [facility] on 06/13/2023 with a chief complaint of acute on chronic SOB.  Assessment:  Initially started on ABX for CAP but d/c'd on admission given viral source; blood cx growing Staph epi in 1 of 3 bottles, likely contaminant.  Name of physician (or Provider) Contacted: MGomex-Carabello MD and JKoomson MD  Current antibiotics: N/A  Changes to prescribed antibiotics recommended:  Recommendations accepted by provider -- no need to treat for now as patient improving on current course but if this represents true infection would recommend Ancef.  Results for orders placed or performed during the hospital encounter of 06/13/23  Blood Culture ID Panel (Reflexed) (Collected: 06/13/2023 12:08 PM)  Result Value Ref Range   Enterococcus faecalis NOT DETECTED NOT DETECTED   Enterococcus Faecium NOT DETECTED NOT DETECTED   Listeria monocytogenes NOT DETECTED NOT DETECTED   Staphylococcus species DETECTED (A) NOT DETECTED   Staphylococcus aureus (BCID) NOT DETECTED NOT DETECTED   Staphylococcus epidermidis DETECTED (A) NOT DETECTED   Staphylococcus lugdunensis NOT DETECTED NOT DETECTED   Streptococcus species NOT DETECTED NOT DETECTED   Streptococcus agalactiae NOT DETECTED NOT DETECTED   Streptococcus pneumoniae NOT DETECTED NOT DETECTED   Streptococcus pyogenes NOT DETECTED NOT DETECTED   A.calcoaceticus-baumannii NOT DETECTED NOT DETECTED   Bacteroides fragilis NOT DETECTED NOT DETECTED   Enterobacterales NOT DETECTED NOT DETECTED   Enterobacter cloacae complex NOT DETECTED NOT DETECTED   Escherichia coli NOT DETECTED NOT DETECTED   Klebsiella aerogenes NOT DETECTED NOT DETECTED   Klebsiella oxytoca NOT DETECTED NOT DETECTED   Klebsiella pneumoniae NOT DETECTED NOT DETECTED   Proteus species NOT DETECTED NOT DETECTED   Salmonella species NOT DETECTED  NOT DETECTED   Serratia marcescens NOT DETECTED NOT DETECTED   Haemophilus influenzae NOT DETECTED NOT DETECTED   Neisseria meningitidis NOT DETECTED NOT DETECTED   Pseudomonas aeruginosa NOT DETECTED NOT DETECTED   Stenotrophomonas maltophilia NOT DETECTED NOT DETECTED   Candida albicans NOT DETECTED NOT DETECTED   Candida auris NOT DETECTED NOT DETECTED   Candida glabrata NOT DETECTED NOT DETECTED   Candida krusei NOT DETECTED NOT DETECTED   Candida parapsilosis NOT DETECTED NOT DETECTED   Candida tropicalis NOT DETECTED NOT DETECTED   Cryptococcus neoformans/gattii NOT DETECTED NOT DETECTED   Methicillin resistance mecA/C NOT DETECTED NOT DETECTED    Vernard Gambles, PharmD, BCPS  06/14/2023  11:53 PM

## 2023-06-15 DIAGNOSIS — E871 Hypo-osmolality and hyponatremia: Secondary | ICD-10-CM | POA: Diagnosis not present

## 2023-06-15 DIAGNOSIS — J449 Chronic obstructive pulmonary disease, unspecified: Secondary | ICD-10-CM | POA: Diagnosis not present

## 2023-06-15 DIAGNOSIS — J9621 Acute and chronic respiratory failure with hypoxia: Secondary | ICD-10-CM | POA: Diagnosis not present

## 2023-06-15 DIAGNOSIS — I272 Pulmonary hypertension, unspecified: Secondary | ICD-10-CM | POA: Diagnosis not present

## 2023-06-15 LAB — RENAL FUNCTION PANEL
Albumin: 3 g/dL — ABNORMAL LOW (ref 3.5–5.0)
Anion gap: 7 (ref 5–15)
BUN: 14 mg/dL (ref 8–23)
CO2: 32 mmol/L (ref 22–32)
Calcium: 8.8 mg/dL — ABNORMAL LOW (ref 8.9–10.3)
Chloride: 95 mmol/L — ABNORMAL LOW (ref 98–111)
Creatinine, Ser: 1.09 mg/dL (ref 0.61–1.24)
GFR, Estimated: >60 mL/min (ref >60)
Glucose, Bld: 96 mg/dL (ref 70–99)
Phosphorus: 3.5 mg/dL (ref 2.5–4.6)
Potassium: 4.1 mmol/L (ref 3.5–5.1)
Sodium: 134 mmol/L — ABNORMAL LOW (ref 135–145)

## 2023-06-15 LAB — CBC
HCT: 35.9 % — ABNORMAL LOW (ref 39.0–52.0)
Hemoglobin: 11 g/dL — ABNORMAL LOW (ref 13.0–17.0)
MCH: 23.4 pg — ABNORMAL LOW (ref 26.0–34.0)
MCHC: 30.6 g/dL (ref 30.0–36.0)
MCV: 76.4 fL — ABNORMAL LOW (ref 80.0–100.0)
Platelets: 260 10*3/uL (ref 150–400)
RBC: 4.7 MIL/uL (ref 4.22–5.81)
RDW: 13 % (ref 11.5–15.5)
WBC: 16.3 10*3/uL — ABNORMAL HIGH (ref 4.0–10.5)
nRBC: 0 % (ref 0.0–0.2)

## 2023-06-15 MED ORDER — ALBUTEROL SULFATE (2.5 MG/3ML) 0.083% IN NEBU: 2.5000 mg | INHALATION_SOLUTION | Freq: Two times a day (BID) | RESPIRATORY_TRACT | Status: DC

## 2023-06-15 MED ORDER — ALBUTEROL SULFATE (2.5 MG/3ML) 0.083% IN NEBU
2.5000 mg | INHALATION_SOLUTION | Freq: Two times a day (BID) | RESPIRATORY_TRACT | Status: DC
  Administered 2023-06-15: 2.5 mg via RESPIRATORY_TRACT
  Filled 2023-06-15: qty 3

## 2023-06-15 MED ORDER — SALINE SPRAY 0.65 % NA SOLN
1.0000 | NASAL | Status: DC | PRN
Start: 2023-06-15 — End: 2023-06-20
  Administered 2023-06-16: 1 via NASAL
  Filled 2023-06-15: qty 44

## 2023-06-15 MED ORDER — EZETIMIBE 10 MG PO TABS
10.0000 mg | ORAL_TABLET | Freq: Every day | ORAL | Status: DC
  Administered 2023-06-15 – 2023-06-20 (×6): 10 mg via ORAL
  Filled 2023-06-15 (×6): qty 1

## 2023-06-15 NOTE — Evaluation (Signed)
 Occupational Therapy Evaluation Patient Details Name: [name] [name] MRN: [id number] DOB: 05/03/1960 Today's Date: 06/15/2023   History of Present Illness   Pt is a 63 yo male presenting to  [facility] ED on 06/13/23 via EMS for acute on chronic hypoxic respiratory failure. PMH of COPD, CKD III, HTN, gout.     Clinical Impressions Pt admitted for above, PTA pt reports being Ind no AD without any falls but traveling short distances due to SOB, ind for ADLs. Pt currently limited by DOE, desats to 70s-80s with brief period of standing. Pt still able to complete seated ADLs with setup but due to strong DOE he is not able to perform much standing activity, stands with supervision no AD. Pt would need the recommended equipment to DC home safely, he remains determined to get better. OT to continue following pt acutely to address listed deficits and promote activity tolerance. Patient would benefit from post acute Home OT services to help maximize functional independence in natural environment      If plan is discharge home, recommend the following:   Assistance with cooking/housework;Assist for transportation;A little help with bathing/dressing/bathroom     Functional Status Assessment   Patient has had a recent decline in their functional status and/or demonstrates limited ability to make significant improvements in function in a reasonable and predictable amount of time     Equipment Recommendations   Wheelchair (measurements OT);Wheelchair cushion (measurements OT);BSC/3in1;Other (comment) (Rollator)     Recommendations for Other Services         Precautions/Restrictions   Precautions Precautions: Fall Recall of Precautions/Restrictions: Intact Precaution/Restrictions Comments: watch 02 Restrictions Weight Bearing Restrictions Per Provider Order: No     Mobility Bed Mobility                    Transfers                          Balance Overall balance  assessment: Needs assistance Sitting-balance support: Bilateral upper extremity supported Sitting balance-Leahy Scale: Good       Standing balance-Leahy Scale: Fair                             ADL either performed or assessed with clinical judgement   ADL Overall ADL's : Needs assistance/impaired Eating/Feeding: Independent;Sitting   Grooming: Sitting;Set up   Upper Body Bathing: Sitting;Set up   Lower Body Bathing: Set up;Sitting/lateral leans   Upper Body Dressing : Set up;Sitting   Lower Body Dressing: Set up;Sitting/lateral leans   Toilet Transfer: Supervision/safety;Stand-pivot;BSC/3in1   Toileting- Clothing Manipulation and Hygiene: Supervision/safety;Sitting/lateral lean       Functional mobility during ADLs: Supervision/safety (stood only) General ADL Comments: limited in standing tolerance due to DOE. educated pt on 5'ps of energy conservation strategies     Vision Baseline Vision/History:  (reports low vision) Ability to See in Adequate Light: 1 Impaired Patient Visual Report: No change from baseline       Perception         Praxis         Pertinent Vitals/Pain Pain Assessment Pain Assessment: Faces Faces Pain Scale: Hurts little more Pain Location: chest Pain Descriptors / Indicators: Tightness Pain Intervention(s): Monitored during session, Repositioned, Limited activity within patient's tolerance     Extremity/Trunk Assessment Upper Extremity Assessment Upper Extremity Assessment: Overall WFL for tasks assessed   Lower Extremity Assessment Lower Extremity Assessment: Defer to PT  evaluation       Communication Communication Communication: No apparent difficulties   Cognition Arousal: Alert Behavior During Therapy: WFL for tasks assessed/performed Cognition: No apparent impairments                               Following commands: Intact       Cueing  General Comments   Cueing Techniques: Verbal  cues  Pt desat to 72% not long after standing, with time and pursed lip breathing his Sp02 increased back to 92%   Exercises     Shoulder Instructions      Home Living Family/patient expects to be discharged to:: Private residence Living Arrangements: Children (lives daughter) Available Help at Discharge: Family;Available 24 hours/day Type of Home: House Home Access: Level entry (1/2 inch threshold)     Home Layout: One level     Bathroom Shower/Tub: Sponge bathes at baseline (washes up in sink)   Bathroom Toilet: Standard Bathroom Accessibility: Yes How Accessible: Accessible via walker (may be able to fit w/c per pt orders) Home Equipment: None   Additional Comments: home 02 concentrator      Prior Functioning/Environment Prior Level of Function : Independent/Modified Independent             Mobility Comments: Ind no AD, denies any falls, limited by COPD ADLs Comments: Ind    OT Problem List: Cardiopulmonary status limiting activity;Decreased activity tolerance   OT Treatment/Interventions: Self-care/ADL training;Balance training;Therapeutic exercise;Therapeutic activities;Energy conservation;DME and/or AE instruction;Patient/family education      OT Goals(Current goals can be found in the care plan section)   Acute Rehab OT Goals Patient Stated Goal: To do what I can to get better OT Goal Formulation: With patient Time For Goal Achievement: 06/29/23 Potential to Achieve Goals: Good ADL Goals Pt Will Perform Grooming: with modified independence;sitting Pt Will Perform Lower Body Bathing: with modified independence;sitting/lateral leans;with adaptive equipment Pt Will Perform Lower Body Dressing: with modified independence;sit to/from stand Pt Will Transfer to Toilet: with modified independence;bedside commode;stand pivot transfer Additional ADL Goal #1: Pt will demonstrate independent use of energy conservation strategies to complete functional activities    OT Frequency:  Min 2X/week    Co-evaluation              AM-PAC OT "6 Clicks" Daily Activity     Outcome Measure Help from another person eating meals?: None Help from another person taking care of personal grooming?: A Little Help from another person toileting, which includes using toliet, bedpan, or urinal?: A Little Help from another person bathing (including washing, rinsing, drying)?: A Little Help from another person to put on and taking off regular upper body clothing?: A Little Help from another person to put on and taking off regular lower body clothing?: A Little 6 Click Score: 19   End of Session Equipment Utilized During Treatment: Gait belt;Oxygen (9L HFNC) Nurse Communication: Mobility status  Activity Tolerance: Patient tolerated treatment well Patient left: in bed;with call bell/phone within reach  OT Visit Diagnosis: Other abnormalities of gait and mobility (R26.89)                Time: 4782-9562 OT Time Calculation (min): 31 min Charges:  OT General Charges $OT Visit: 1 Visit OT Evaluation $OT Eval Low Complexity: 1 Low OT Treatments $Therapeutic Activity: 8-22 mins  06/15/2023  AB, OTR/L  Acute Rehabilitation Services  Office: [phone]   [name] [name] 06/15/2023, 11:39 AM

## 2023-06-15 NOTE — Progress Notes (Addendum)
 HD#2 SUBJECTIVE:  Patient Summary:[name] [name] is a 63 year old man with a past medical history of COPD on oxygen, CKD stage II, hypertension, gout who presented to [facility] ED via EMS due to respiratory distress and admitted for acute on Chronic Hypoxic Respiratory Failure 2/2 non-Covid coronavirus infection. Today is hospital Day 2.  Overnight Events and Interim History: Doing well this morning on 9L [place], near his baseline of 8L. He still experiences dyspnea with movement and is quite anxious about that. He is worried about going home and not being set up well. He wonders how much time he has left. He thinks a wheelchair would help him at home.  Significant Hospital Events:  3/5 Admission for AoC Hypoxic RF. Placed then weaned from Bipap to HFNC.   OBJECTIVE:  Vital Signs: Vitals:   06/14/23 2010 06/15/23 0310 06/15/23 0759 06/15/23 0850  BP: (!) 133/95 124/85 130/74   Pulse: 91 87 94   Resp: 19 20 19    Temp: 98.1 F (36.7 C) 98 F (36.7 C) 97.9 F (36.6 C)   TempSrc: Oral Oral Oral   SpO2: 95% 98% 97% 95%  Weight:      Height:       Supplemental O2: HFNC SpO2: 95 % O2 Flow Rate (L/min): 9 L/min FiO2 (%): 80 %  Filed Weights   06/13/23 1156  Weight: 92.6 kg     Intake/Output Summary (Last 24 hours) at 06/15/2023 1046 Last data filed at 06/15/2023 1610 Gross per 24 hour  Intake 480 ml  Output 1950 ml  Net -1470 ml   Net IO Since Admission: -1,870 mL [06/15/23 1046]  Physical Exam: Physical Exam Constitutional:      General: He is not in acute distress. Conversational and somewhat anxious.    Appearance: He is well-developed. He is not ill-appearing.     Comments: Wearing HFNC.  Cardiovascular:     Rate and Rhythm: Normal rate and regular rhythm.  Pulmonary:     Effort: Pulmonary effort is normal. No tachypnea, accessory muscle usage or respiratory distress. There are mild wheezes bilaterally.    Breath sounds: Examination of the right-upper field reveals  decreased breath sounds. Examination of the left-upper field reveals decreased breath sounds. Examination of the right-middle field reveals decreased breath sounds. Examination of the left-middle field reveals decreased breath sounds. Decreased breath sounds present.  Skin:    General: Skin is warm and dry.  Neurological:     General: No focal deficit present.     Mental Status: He is alert and oriented to person, place, and time.  Psychiatric:        Mood and Affect: Mood normal.        Behavior: Behavior normal.   Patient Lines/Drains/Airways Status     Active Line/Drains/Airways     Name Placement date Placement time Site Days   Peripheral IV 06/13/23 18 G Left Antecubital 06/13/23  --  Antecubital  2   Peripheral IV 06/13/23 18 G Right Forearm 06/13/23  --  Forearm  2             ASSESSMENT/PLAN:  Assessment: Principal Problem:   Acute on chronic hypoxic respiratory failure (HCC) Active Problems:   COPD with acute exacerbation (HCC)   Viral respiratory infection  [name] [name] is a 63 year old man with a past medical history of COPD on oxygen, CKD stage II, hypertension, gout who presented to [facility] ED via EMS due to respiratory distress and admitted for acute on  Chronic Hypoxic Respiratory Failure 2/2 non-Covid coronavirus infection. Today is hospital Day 2.  Plan: Acute on Chronic Hypoxic Respiratory Failure 2/2 Non-Covid Coronavirus infection COPD on 8L baseline oxygen requirement Pulmonary Hypertension Improving. This patient has end stage COPD presenting with 1 week of progressive dyspnea culminating in respiratory distress. Cause appears to be from non-Covid coronavirus infection, no abx indicated. Pt is in favor of intubation should his respiratory status worsen. - Prednisone 40 every day, day 2/5 - Scheduled LAMA/LABA/ICS inhalers and Duonebs prn. Expect today's leukocytosis is from prednisone given clinical improvement. - Follow-up blood cultures - Wean  O2 as able, goal O2 88-93% on 8L, his baseline. - PT/OT, pt would like a wheelchair - Echo showed some increased pressures and tricuspid regurgitation from previous study. Recommend close follow-up with his outpatient pulmonologist. - Discussed his disease and prognosis in detail. He is quite anxious about his disease but acknowledges the severity. In past per chart review, was not interested in pulmonary rehab. This is changed and I wonder if it is an option for him now.  Note: baseline oxygen is 8L at rest (innacurately reported 6L earlier) and 10L with activity, presently he requires 9L at rest and desaturates with movement.    Hyponatremia - resolved Improved to 134 from 129.   ~   Chronic Issues Gout Resume Allopurinol 100 daily Hypertension Hold home Amlodipine 10 daily, Metoprolol succinate 50 daily, do not need to resume, as Bps fine right now. Hyperlipidemia Zetia 10 daily   Best Practice: Diet: Regular diet IVF: None VTE: rivaroxaban (XARELTO) tablet 10 mg Start: 06/14/23 1045 Code: Full AB: None Pain Medicine: Tylenol prn Bowel Regimen: Miralax qd Therapy Recs: pending DISPO: Anticipated discharge in 0-3 days to  TBD  pending  treatment and eval .  Signature: Katheran James, D.O.  Internal Medicine Resident, PGY-1 [facility]  Pager: [phone] 10:46 AM, 06/15/2023   Please contact the on call pager after 5 pm and on weekends at [phone].

## 2023-06-15 NOTE — Progress Notes (Signed)
 SATURATION QUALIFICATIONS: (This note is used to comply with regulatory documentation for home oxygen)  Patient Saturations on 9L HFNC at Rest = 93%  Patient Saturations on 9L while Standing/ambulating = 72%  Please briefly explain why patient needs home oxygen: Pt needing supplemental 02 at rest to maintain stable 02 sats, pt not able to stand longer than 1 minute before 02 levels begin to drop to low 70s. Pt needing 9L of HFNC, seated rest, and pursed lip breathing strategies to recover.   06/15/2023  AB, OTR/L  Acute Rehabilitation Services  Office: [phone]

## 2023-06-15 NOTE — Evaluation (Addendum)
 Physical Therapy Evaluation Patient Details Name: [name] [name] MRN: [id number] DOB: 06/05/1960 Today's Date: 06/15/2023  History of Present Illness  Pt is a 63 yo male presenting to [facility] ED on 06/13/23 via EMS for acute on chronic hypoxic respiratory failure. PMH of COPD, CKD III, HTN, gout.  Clinical Impression  Pt in bed upon arrival and agreeable to PT eval. PTA, pt was independent with no AD, however, pt was becoming limited in gait distance due to increased WOB. In today's session, pt was ModI to stand and required supervision to ambulate x24 ft with a RW. Pt was able to maintain >90% on 10L while ambulating but would desat to 80% while resting. Pt would stand in tri-pod recovery position while performing PLB. Pt currently with functional limitations due to the deficits listed below (see PT Problem List). Pt would benefit from acute skilled PT to address functional impairments. Pt would benefit from HHPT for a home evaluation for safety and to work on activity tolerance. Acute PT to follow.         If plan is discharge home, recommend the following: Help with stairs or ramp for entrance;Assist for transportation   Can travel by private vehicle    Yes    Equipment Recommendations Rollator (4 wheels);BSC/3in1;Wheelchair (measurements PT);Wheelchair cushion (measurements PT)     Functional Status Assessment Patient has had a recent decline in their functional status and demonstrates the ability to make significant improvements in function in a reasonable and predictable amount of time.     Precautions / Restrictions Precautions Precautions: Fall Recall of Precautions/Restrictions: Intact Precaution/Restrictions Comments: watch 02 Restrictions Weight Bearing Restrictions Per Provider Order: No      Mobility  Bed Mobility  General bed mobility comments: on EOB upon arrival    Transfers Overall transfer level: Modified independent Equipment used: Rolling walker (2 wheels), None      Ambulation/Gait Ambulation/Gait assistance: Supervision Gait Distance (Feet): 24 Feet (x24, x24) Assistive device: Rolling walker (2 wheels) Gait Pattern/deviations: Step-through pattern, Trunk flexed Gait velocity: decr     General Gait Details: short and steady steps with cues for RW proximity. >90% on 10L, with rest pt would desat to 80%. Needed at least 1 minute for sats to raise to >90%     Balance Overall balance assessment: Needs assistance Sitting-balance support: Bilateral upper extremity supported, Feet supported Sitting balance-Leahy Scale: Good     Standing balance support: Bilateral upper extremity supported, During functional activity, Reliant on assistive device for balance Standing balance-Leahy Scale: Fair Standing balance comment: able to stand statically        Pertinent Vitals/Pain Pain Assessment Pain Assessment: Faces Faces Pain Scale: Hurts little more Pain Location: chest Pain Descriptors / Indicators: Tightness Pain Intervention(s): Limited activity within patient's tolerance, Monitored during session, Repositioned    Home Living Family/patient expects to be discharged to:: Private residence Living Arrangements: Children (lives daughter) Available Help at Discharge: Family;Available 24 hours/day Type of Home: House Home Access: Level entry (1/2 inch threshold)       Home Layout: One level Home Equipment: None Additional Comments: home 02 concentrator    Prior Function Prior Level of Function : Independent/Modified Independent    Mobility Comments: Ind no AD, denies any falls, limited by COPD ADLs Comments: Ind     Extremity/Trunk Assessment   Upper Extremity Assessment Upper Extremity Assessment: Overall WFL for tasks assessed    Lower Extremity Assessment Lower Extremity Assessment: Overall WFL for tasks assessed    Cervical / Trunk  Assessment Cervical / Trunk Assessment: Normal  Communication   Communication Communication:  No apparent difficulties    Cognition Arousal: Alert Behavior During Therapy: WFL for tasks assessed/performed   PT - Cognitive impairments: No apparent impairments    Following commands: Intact       Cueing Cueing Techniques: Verbal cues     General Comments General comments (skin integrity, edema, etc.): Discussed safety with rollator with using breaks and pushing rollator against a wall prior to sitting. Educated on energy conservation and monitoring O2 sats.    Exercises General Exercises - Lower Extremity Long Arc Quad: AROM, Both, 5 reps, Seated Hip Flexion/Marching: AROM, Both, 5 reps, Seated Heel Raises: AROM, Both, 5 reps, Seated   Assessment/Plan    PT Assessment Patient needs continued PT services  PT Problem List Decreased activity tolerance;Decreased balance;Decreased mobility;Decreased knowledge of use of DME;Cardiopulmonary status limiting activity       PT Treatment Interventions DME instruction;Gait training;Functional mobility training;Therapeutic activities;Therapeutic exercise;Stair training;Balance training;Neuromuscular re-education;Patient/family education    PT Goals (Current goals can be found in the Care Plan section)  Acute Rehab PT Goals Patient Stated Goal: to be able to walk further PT Goal Formulation: With patient Time For Goal Achievement: 06/29/23 Potential to Achieve Goals: Good    Frequency Min 3X/week        AM-PAC PT "6 Clicks" Mobility  Outcome Measure Help needed turning from your back to your side while in a flat bed without using bedrails?: None Help needed moving from lying on your back to sitting on the side of a flat bed without using bedrails?: None Help needed moving to and from a bed to a chair (including a wheelchair)?: None Help needed standing up from a chair using your arms (e.g., wheelchair or bedside chair)?: None Help needed to walk in hospital room?: None Help needed climbing 3-5 steps with a railing? : A  Little 6 Click Score: 23    End of Session Equipment Utilized During Treatment: Oxygen Activity Tolerance: Patient tolerated treatment well Patient left: in bed;with call bell/phone within reach Nurse Communication: Mobility status PT Visit Diagnosis: Other abnormalities of gait and mobility (R26.89)    Time: 1140-1206 PT Time Calculation (min) (ACUTE ONLY): 26 min   Charges:   PT Evaluation $PT Eval Low Complexity: 1 Low PT Treatments $Therapeutic Exercise: 8-22 mins PT General Charges $$ ACUTE PT VISIT: 1 Visit       Hilton Cork, PT, DPT Secure Chat Preferred  Rehab Office [phone]   Arturo Morton Brion Aliment 06/15/2023, 12:28 PM

## 2023-06-16 DIAGNOSIS — J9621 Acute and chronic respiratory failure with hypoxia: Secondary | ICD-10-CM | POA: Diagnosis not present

## 2023-06-16 DIAGNOSIS — J441 Chronic obstructive pulmonary disease with (acute) exacerbation: Secondary | ICD-10-CM | POA: Diagnosis not present

## 2023-06-16 LAB — CBC
HCT: 37.2 % — ABNORMAL LOW (ref 39.0–52.0)
Hemoglobin: 11.3 g/dL — ABNORMAL LOW (ref 13.0–17.0)
MCH: 23.2 pg — ABNORMAL LOW (ref 26.0–34.0)
MCHC: 30.4 g/dL (ref 30.0–36.0)
MCV: 76.4 fL — ABNORMAL LOW (ref 80.0–100.0)
Platelets: 261 10*3/uL (ref 150–400)
RBC: 4.87 MIL/uL (ref 4.22–5.81)
RDW: 13.1 % (ref 11.5–15.5)
WBC: 15 10*3/uL — ABNORMAL HIGH (ref 4.0–10.5)
nRBC: 0 % (ref 0.0–0.2)

## 2023-06-16 LAB — CULTURE, BLOOD (ROUTINE X 2): Special Requests: ADEQUATE

## 2023-06-16 MED ORDER — DICLOFENAC SODIUM 1 % EX GEL: 2.0000 g | CUTANEOUS | Status: DC | PRN

## 2023-06-16 MED ORDER — IPRATROPIUM-ALBUTEROL 0.5-2.5 (3) MG/3ML IN SOLN
3.0000 mL | RESPIRATORY_TRACT | Status: AC
  Administered 2023-06-16 (×4): 3 mL via RESPIRATORY_TRACT
  Filled 2023-06-16 (×4): qty 3

## 2023-06-16 MED ORDER — LIDOCAINE 5 % EX PTCH
1.0000 | MEDICATED_PATCH | Freq: Every day | CUTANEOUS | Status: DC
  Administered 2023-06-16 – 2023-06-20 (×5): 1 via TRANSDERMAL
  Filled 2023-06-16 (×5): qty 1

## 2023-06-16 NOTE — Progress Notes (Signed)
 HD#3 Subjective:  Patient Summary: [name] [name] is a 63 y.o. with a pertinent PMH of COPD on 6 to 8 L at home, CKD 3a, hypertension, gout, who presented with dyspnea and hypoxia and admitted on 3/5 for acute hypoxic respiratory failure from COPD exacerbation secondary to coronavirus on HD#3.   Overnight Events: Desaturated into the 70s when walking with therapy yesterday on 9 L nasal cannula. He remained on 9 L nasal cannula overnight.  His wheezing feels a little bit better yesterday.  He reports that working with the therapist was hard and he could not ambulate more than a few steps due to his breathing.  He is interested talking with the palliative team while here.  We reviewed what palliative medicine is and why team thinks that he would benefit from this in the setting of his end-stage COPD with pulmonary hypertension.  Pt is updated on the plan for today, and all questions and concerns are addressed.   Objective:  Vital signs in last 24 hours: Vitals:   06/15/23 1954 06/15/23 1955 06/15/23 2329 06/16/23 0518  BP: (!) 157/78  (!) 157/90 125/78  Pulse: 95  85 89  Resp: (!) 21  (!) 23 20  Temp: 98.3 F (36.8 C)  98.3 F (36.8 C) 98.6 F (37 C)  TempSrc: Oral  Oral Oral  SpO2: 94% 90% 96% 90%  Weight:      Height:       Supplemental O2: HFNC SpO2: 90 % O2 Flow Rate (L/min): 9 L/min FiO2 (%): 80 %   Physical Exam:  Constitutional: Chronically ill-appearing, in no acute distress, sitting up at bedside with arms on table Cardiovascular: regular rate and rhythm, no m/r/g Pulmonary/Chest: normal work of breathing on 9 L nasal cannula, lungs clear to auscultation bilaterally MSK: No lower extremity edema Neurological: Alert and oriented x 3 Skin: warm and dry  Filed Weights   06/13/23 1156  Weight: 92.6 kg     Intake/Output Summary (Last 24 hours) at 06/16/2023 0555 Last data filed at 06/16/2023 0445 Gross per 24 hour  Intake 480 ml  Output 2975 ml  Net -2495 ml    Net IO Since Admission: -3,565 mL [06/16/23 0555]  Pertinent Labs:    Latest Ref Rng & Units 06/16/2023    3:27 AM 06/15/2023    3:24 AM 06/14/2023    3:24 AM  CBC  WBC 4.0 - 10.5 K/uL 15.0  16.3  7.6   Hemoglobin 13.0 - 17.0 g/dL 62.1  30.8  65.7   Hematocrit 39.0 - 52.0 % 37.2  35.9  37.2   Platelets 150 - 400 K/uL 261  260  249        Latest Ref Rng & Units 06/15/2023    3:24 AM 06/14/2023    3:24 AM 06/13/2023   11:59 AM  CMP  Glucose 70 - 99 mg/dL 96  846    BUN 8 - 23 mg/dL 14  11    Creatinine 9.62 - 1.24 mg/dL 9.52  8.41    Sodium 324 - 145 mmol/L 134  129  135   Potassium 3.5 - 5.1 mmol/L 4.1  3.9  3.9   Chloride 98 - 111 mmol/L 95  93    CO2 22 - 32 mmol/L 32  30    Calcium 8.9 - 10.3 mg/dL 8.8  8.7     Assessment/Plan:   Principal Problem:   Acute on chronic hypoxic respiratory failure (HCC) Active Problems:   COPD with  acute exacerbation (HCC)   Viral respiratory infection   Patient Summary: [name] [name] is a 63 y.o. with a pertinent PMH of COPD on 6 to 8 L at home, CKD 3a, hypertension, gout, who presented with dyspnea and hypoxia and admitted on 3/5 for acute hypoxic respiratory failure from COPD exacerbation secondary to coronavirus on HD#3.   Acute on chronic hypoxic respiratory failure COPD exacerbation, is 8 L baseline oxygen Coronavirus He is slowly improving.  He desaturated significantly when working with therapy yesterday into the 70s on 9 L nasal cannula. Leukocytosis improving in setting of steroid administration.  For today we will continue scheduled nebulizers and have asked patient to work with RN on transitioning out of bed to chair and walking as able  -Continue droplet precaution -Continue prednisone 40 mg, day 3 of 5 -Continue scheduled LABA/ICS inhalers.  Will schedule DuoNebs today every 4 hours.  Yupelri discontinued in setting of scheduled DuoNebs -PT/OT with DME equipment recs -Incentive spirometer, flutter valve  History of severe  pulmonary hypertension Severe tricuspid regurgitation He follows with Dr. Isaiah Serge as outpatient.  He has significant anxiety and is symptomatic at home.  He is interested in outpatient palliative referral at discharge.  He talked about palliative medicine on rounds today.  He would like to talk with them while he is admitted to the hospital.  -Consulted palliative medicine for symptom management  Hypertension Blood pressures tensive after not receiving blood pressures last 2 days.  Medications include amlodipine 10 mg, metoprolol succinate 50 mg  -Holding home medications in setting of acute illness. -Would plan to restart amlodipine if needed.  Question if beta-blocker is a good choice in this patient with severe lung disease.  Chronic stable conditions: Gout-allopurinol 100 mg daily resumed Lipidemia-Zetia resumed CKD 3A-GFR actually greater than 60 here  Diet: Normal IVF: None,None VTE: NOAC Code: Full PT/OT recs: Home Health, rollator; bedside commode; wheelchair; wheelchair cushion.  Dispo: Anticipated discharge to Home in 1 to 2 days pending improvement in wheezing and oxygen saturation with ambulation  Kentrell Guettler M. Glennon Kopko, D.O.  Internal Medicine Resident, PGY-3 [facility]  Pager: [phone] 5:55 AM, 06/16/2023   **Please contact the on call pager after 5 pm and on weekends at [phone].**

## 2023-06-17 DIAGNOSIS — J9621 Acute and chronic respiratory failure with hypoxia: Secondary | ICD-10-CM | POA: Diagnosis not present

## 2023-06-17 DIAGNOSIS — J441 Chronic obstructive pulmonary disease with (acute) exacerbation: Secondary | ICD-10-CM | POA: Diagnosis not present

## 2023-06-17 DIAGNOSIS — U071 COVID-19: Secondary | ICD-10-CM

## 2023-06-17 LAB — BASIC METABOLIC PANEL
Anion gap: 5 (ref 5–15)
BUN: 11 mg/dL (ref 8–23)
CO2: 37 mmol/L — ABNORMAL HIGH (ref 22–32)
Calcium: 8.9 mg/dL (ref 8.9–10.3)
Chloride: 94 mmol/L — ABNORMAL LOW (ref 98–111)
Creatinine, Ser: 1.06 mg/dL (ref 0.61–1.24)
GFR, Estimated: >60 mL/min (ref >60)
Glucose, Bld: 110 mg/dL — ABNORMAL HIGH (ref 70–99)
Potassium: 3.7 mmol/L (ref 3.5–5.1)
Sodium: 136 mmol/L (ref 135–145)

## 2023-06-17 LAB — CBC
HCT: 36.5 % — ABNORMAL LOW (ref 39.0–52.0)
Hemoglobin: 10.9 g/dL — ABNORMAL LOW (ref 13.0–17.0)
MCH: 23 pg — ABNORMAL LOW (ref 26.0–34.0)
MCHC: 29.9 g/dL — ABNORMAL LOW (ref 30.0–36.0)
MCV: 77 fL — ABNORMAL LOW (ref 80.0–100.0)
Platelets: 268 10*3/uL (ref 150–400)
RBC: 4.74 MIL/uL (ref 4.22–5.81)
RDW: 13.1 % (ref 11.5–15.5)
WBC: 15.9 10*3/uL — ABNORMAL HIGH (ref 4.0–10.5)
nRBC: 0 % (ref 0.0–0.2)

## 2023-06-17 MED ORDER — PREDNISONE 20 MG PO TABS
60.0000 mg | ORAL_TABLET | Freq: Every day | ORAL | Status: DC
  Administered 2023-06-18 – 2023-06-19 (×2): 60 mg via ORAL
  Filled 2023-06-17 (×2): qty 3

## 2023-06-17 MED ORDER — IPRATROPIUM-ALBUTEROL 0.5-2.5 (3) MG/3ML IN SOLN
3.0000 mL | Freq: Four times a day (QID) | RESPIRATORY_TRACT | Status: DC
  Administered 2023-06-17 – 2023-06-19 (×6): 3 mL via RESPIRATORY_TRACT
  Filled 2023-06-17 (×6): qty 3

## 2023-06-17 MED ORDER — AMLODIPINE BESYLATE 10 MG PO TABS
10.0000 mg | ORAL_TABLET | Freq: Every day | ORAL | Status: DC
  Administered 2023-06-17 – 2023-06-20 (×4): 10 mg via ORAL
  Filled 2023-06-17 (×4): qty 1

## 2023-06-17 MED ORDER — PREDNISONE 20 MG PO TABS: 60.0000 mg | ORAL_TABLET | Freq: Every day | ORAL | Status: DC

## 2023-06-17 MED ORDER — METHYLPREDNISOLONE SODIUM SUCC 125 MG IJ SOLR
80.0000 mg | Freq: Once | INTRAMUSCULAR | Status: AC
  Administered 2023-06-17: 80 mg via INTRAVENOUS
  Filled 2023-06-17: qty 2

## 2023-06-17 NOTE — TOC Progression Note (Signed)
 Transition of Care [facility]) - Progression Note    Patient Details  Name: [name] [name] MRN: [id number] Date of Birth: March 08, 1961  [facility]) CM/SW Contact  Ronny Bacon, RN Phone Number: 06/17/2023, 12:57 PM  Clinical Narrative:  Patient requiring oxygen at 10L. Will Defer ordering DME until closer to discharge.         Expected Discharge Plan and Services                                               Social Determinants of Health (SDOH) Interventions SDOH Screenings   Food Insecurity: No Food Insecurity (06/13/2023)  Housing: Low Risk  (06/13/2023)  Transportation Needs: Unmet Transportation Needs (06/13/2023)  Utilities: Not At Risk (06/13/2023)  Depression (PHQ2-9): Low Risk  (05/25/2022)  Tobacco Use: Medium Risk (06/13/2023)    Readmission Risk Interventions     No data to display

## 2023-06-17 NOTE — Consult Note (Signed)
 NAME:  [name] [name], MRN:  [id number], DOB:  1960-12-21, LOS: 4 ADMISSION DATE:  06/13/2023, CONSULTATION DATE:  06/17/23 REFERRING MD:  Rudene Christians, DO CHIEF COMPLAINT:  End stage COPD   History of Present Illness:  63 year old male with COPD on 6-8L O2 at home, CKD IIIA, HTN, gout admitted to IMTS on 3/5 for acute hypoxemic respiratory failure secondary to coronavirus after presenting with 1 week of worsening shortness of breath and desaturations on 10L O2. PCCM consulted for high O2 requirements.  While in the hospital at rest, he is able to tolerate ~9L O2 via [place] however with ambulation he requires up to 15L O2 associated with dyspnea with short distances. Able to speak at rest with no desaturations but does still have shortness of breath with conversation. He is frustrated at his condition and states he has never considered future steps if his COPD were to worsen. He is currently followed by Dr. Isaiah Serge in clinic for COPD and on Symbicort and Spiriva and chronic O2 use ~6L. Prior PFTs with FEV1 37% in 2017 and DLCO 38%. Last COPD exacerbation includes hospitalization in Feb 2024 secondary to COVID pna.  Pertinent  Medical History   COPD on 6-8L O2 at home, CKD IIIA, HTN, gout  Significant Hospital Events: Including procedures, antibiotic start and stop dates in addition to other pertinent events     Interim History / Subjective:  As above  Objective   Blood pressure 127/87, pulse (!) 102, temperature 97.9 F (36.6 C), temperature source Oral, resp. rate 20, height 5\' 6"  (1.676 m), weight 92.6 kg, SpO2 98%.        Intake/Output Summary (Last 24 hours) at 06/17/2023 1502 Last data filed at 06/17/2023 1156 Gross per 24 hour  Intake --  Output 2550 ml  Net -2550 ml   Filed Weights   06/13/23 1156  Weight: 92.6 kg   Physical Exam: General: Chronically ill-appearing, no acute distress, dyspneic with conversation HENT: [place], AT, OP clear, MMM Eyes: EOMI, no scleral icterus Respiratory:  Diminished to auscultation bilaterally.  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 Will extend steroid course. His PH is likely contributing however limited options as this is likely secondary to his chronic lung disease. If qualifies he would benefit from NIV therapy to help with oxygen requirement so will check for chronic hypercarbia. Adding ohtuvayre would also be a reasonable option for him (PDE3 and PDE4 blocker) however unable to obtain inpatient.  --Give IV solumedrol now. Increase prednisone to 60 mg daily --On triple therapy bronchodilators at home: Continue brovana/pulmicort. Change duonebs to scheduled --Pulmonary hygiene including nebulizers above, IS, PT/OOB as tolerated --Obtain ABG if chronic hypercarbia present to qualify for home NIV for COPD --Agree with palliative care consult. Currently full code --When ready for discharge, patient will need pulmonary follow-up to consider addition of ohtuvayre nebs and referral to pulmonary rehab  Best Practice (right click and "Reselect all SmartList Selections" daily)   Per primary  Labs   CBC: Recent Labs  Lab 06/13/23 1150 06/13/23 1159  06/14/23 0324 06/15/23 0324 06/16/23 0327 06/17/23 0439  WBC 9.4  --  7.6 16.3* 15.0* 15.9*  NEUTROABS 4.6  --   --   --   --   --   HGB 12.4* 14.6 11.4* 11.0* 11.3* 10.9*  HCT 41.4 43.0 37.2* 35.9* 37.2* 36.5*  MCV 77.5*  --  76.9* 76.4* 76.4* 77.0*  PLT 271  --  249 260 261 268     Basic Metabolic Panel: Recent Labs  Lab 06/13/23 1150 06/13/23 1159 06/14/23 0324 06/15/23 0324 06/17/23 0439  NA 133* 135 129* 134* 136  K 4.0 3.9 3.9 4.1 3.7  CL 92*  --  93* 95* 94*  CO2 28  --  30 32 37*  GLUCOSE 117*  --  123* 96 110*  BUN 7*  --  11 14 11   CREATININE 1.09  --  1.14 1.09 1.06  CALCIUM 9.2  --  8.7* 8.8* 8.9  PHOS  --   --   --  3.5  --    GFR: Estimated Creatinine Clearance: 75 mL/min (by C-G formula based on SCr of 1.06 mg/dL). Recent Labs  Lab 06/13/23 1212 06/14/23 0324 06/15/23 0324 06/16/23 0327 06/17/23 0439  WBC  --  7.6 16.3* 15.0* 15.9*  LATICACIDVEN 1.6  --   --   --   --     Liver Function Tests: Recent Labs  Lab 06/15/23 0324  ALBUMIN 3.0*   No results for input(s): "LIPASE", "AMYLASE" in the last 168 hours. No results for input(s): "AMMONIA" in the last 168 hours.  ABG    Component Value Date/Time   PHART 7.317 (L) 12/12/2013 0944   PCO2ART 89.4 (HH) 12/12/2013 0944   PO2ART 76.0 (L) 12/12/2013 0944   HCO3 33.7 (H) 06/13/2023 1159   TCO2 35 (H) 06/13/2023 1159   O2SAT 46 06/13/2023 1159     Coagulation Profile: No results for input(s): "INR", "PROTIME" in the last 168 hours.  Cardiac Enzymes: No results for input(s): "CKTOTAL", "CKMB", "CKMBINDEX", "TROPONINI" in the last 168 hours.  HbA1C: Hgb A1c MFr Bld  Date/Time Value Ref Range Status  03/14/2022 02:20 PM 5.0 4.8 - 5.6 % Final    Comment:             Prediabetes: 5.7 - 6.4          Diabetes: >6.4          Glycemic control for adults with diabetes: <7.0   10/26/2014 04:29 AM 5.2 4.8 - 5.6 % Final    Comment:    (NOTE)         Pre-diabetes: 5.7 - 6.4         Diabetes: >6.4         Glycemic control for adults with diabetes: <7.0     CBG: No results for input(s): "GLUCAP" in the last 168 hours.  Review of Systems:   Review of Systems  Constitutional:  Negative for chills, diaphoresis, fever, malaise/fatigue and weight loss.  HENT:  Negative for  congestion.   Respiratory:  Positive for cough and shortness of breath. Negative for hemoptysis, sputum production and wheezing.   Cardiovascular:  Negative for chest pain, palpitations and leg swelling.     Past Medical History:  He,  has a past medical history of Asthma (04/10/1997), CKD (chronic kidney disease), stage II (08/03/2012), COPD (chronic obstructive pulmonary disease) (HCC), Essential hypertension, Gout, and Thrombocytopenia (HCC) (07/28/2012).   Surgical History:   Past Surgical History:  Procedure Laterality Date  FRACTURE SURGERY  childhood   right arm   KNEE SURGERY     right knee s/p trauma     Social History:   reports that he quit smoking about 4 years ago. His smoking use included cigarettes. He started smoking about 49 years ago. He has a 45 pack-year smoking history. He has never used smokeless tobacco. He reports current alcohol use. He reports that he does not use drugs.   Family History:  His family history includes Cancer in his mother; Colon cancer in an other family member; Healthy in his daughter and son; Heart disease in his father.   Allergies Allergies  Allergen Reactions   Atorvastatin Itching and Swelling    Facial, tongue swelling   Shellfish Allergy Anaphylaxis   Beef-Derived Drug Products     Intolerance due to gout   Colchicine Hives     Home Medications  Prior to Admission medications   Medication Sig Start Date End Date Taking? Authorizing Provider  albuterol (PROVENTIL) (2.5 MG/3ML) 0.083% nebulizer solution TAKE 3 MLS (2.5 MG TOTAL) BY NEBULIZATION EVERY 6 (SIX) HOURS AS NEEDED FOR SHORTNESS OF BREATH. 09/26/22  Yes Hoy Register, MD  albuterol (VENTOLIN HFA) 108 (90 Base) MCG/ACT inhaler Inhale 2 puffs into the lungs every 6 (six) hours as needed for wheezing or shortness of breath 03/27/23  Yes Newlin, Odette Horns, MD  allopurinol (ZYLOPRIM) 100 MG tablet Take 1 tablet (100 mg total) by mouth daily. 05/23/23  Yes Hoy Register, MD   amLODipine (NORVASC) 10 MG tablet Take 1 tablet (10 mg total) by mouth daily. 05/23/23  Yes Hoy Register, MD  Ascorbic Acid (VITAMIN C) 1000 MG tablet Take 1,000 mg by mouth daily.   Yes [provider]  budesonide-formoterol (SYMBICORT) 160-4.5 MCG/ACT inhaler Inhale 2 puffs into the lungs 2 (two) times daily. 06/12/23  Yes Mannam, Praveen, MD  ezetimibe (ZETIA) 10 MG tablet Take 1 tablet (10 mg total) by mouth daily. 05/23/23  Yes Hoy Register, MD  ibuprofen (ADVIL) 200 MG tablet Take 800 mg by mouth daily as needed (gout swelling).   Yes [provider]  metoprolol succinate (TOPROL-XL) 50 MG 24 hr tablet Take 1 tablet (50 mg total) by mouth daily. 05/23/23  Yes Newlin, Enobong, MD  tiotropium (SPIRIVA HANDIHALER) 18 MCG inhalation capsule PLACE 1 CAPSULE (18 MCG TOTAL) INTO INHALER AND INHALE DAILY. 05/22/23  Yes Hoy Register, MD  vitamin B-12 (CYANOCOBALAMIN) 500 MCG tablet Take 500 mcg by mouth daily.   Yes [provider]  VITAMIN D PO Take 1 tablet by mouth daily.   Yes [provider]  ALBUTEROL IN Inhale into the lungs.  07/03/11  [provider]     Critical care time: n/a    Care Time: MDM Moderate  Mechele Collin, M.D. [facility] Pulmonary/Critical Care Medicine 06/17/2023 3:02 PM   See Amion for personal pager For hours between 7 PM to 7 AM, please call Elink for urgent questions

## 2023-06-17 NOTE — Progress Notes (Addendum)
 HD#4 Subjective:  Patient Summary: [name] [name] is a 63 y.o. with a pertinent PMH of COPD on 6 to 8 L at home, CKD 3a, hypertension, gout, who presented with dyspnea and hypoxia and admitted on 3/5 for acute hypoxic respiratory failure from COPD exacerbation secondary to coronavirus on HD#4.   Overnight Events: Tachycardic overnight to low 100s.  On 11 L high flow nasal cannula at rest.  Patient seen at bedside.  He reports that his breathing feels okay but as soon as he moves the oxygen levels drop.  He thinks that his oxygen supply company has told him that there is a way for him to get higher levels of oxygen at home with a different concentrator.  He is frustrated at having to remain in the hospital.  Pt is updated on the plan for today, and all questions and concerns are addressed.   Objective:  Vital signs in last 24 hours: Vitals:   06/16/23 0518 06/16/23 1216 06/16/23 1711 06/16/23 2136  BP: 125/78 134/88 (!) 148/94 (!) 144/89  Pulse: 89 100 95 (!) 107  Resp: 20 20 17 17   Temp: 98.6 F (37 C) 98.3 F (36.8 C) 97.7 F (36.5 C)   TempSrc: Oral Oral Oral   SpO2: 90% 96% 97% 98%  Weight:      Height:       Supplemental O2: Nasal Cannula SpO2: 98 % O2 Flow Rate (L/min): 11 L/min FiO2 (%): 80 %   Physical Exam:  Constitutional: well-appearing, in no acute distress, sitting up on edge of bed, leaning against table Cardiovascular: regular rate and rhythm, no m/r/g Pulmonary/Chest: normal work of breathing on 10L [place], lungs sounds are distant, minimal wheezing present MSK: no lower extremity edema, appears euvolemic Neurological: alert and oriented x3 Skin: warm and dry  Filed Weights   06/13/23 1156  Weight: 92.6 kg     Intake/Output Summary (Last 24 hours) at 06/17/2023 0705 Last data filed at 06/17/2023 1610 Gross per 24 hour  Intake --  Output 2175 ml  Net -2175 ml   Net IO Since Admission: -5,885 mL [06/17/23 0705]  Pertinent Labs:    Latest Ref Rng &  Units 06/17/2023    4:39 AM 06/16/2023    3:27 AM 06/15/2023    3:24 AM  CBC  WBC 4.0 - 10.5 K/uL 15.9  15.0  16.3   Hemoglobin 13.0 - 17.0 g/dL 96.0  45.4  09.8   Hematocrit 39.0 - 52.0 % 36.5  37.2  35.9   Platelets 150 - 400 K/uL 268  261  260        Latest Ref Rng & Units 06/17/2023    4:39 AM 06/15/2023    3:24 AM 06/14/2023    3:24 AM  CMP  Glucose 70 - 99 mg/dL 119  96  147   BUN 8 - 23 mg/dL 11  14  11    Creatinine 0.61 - 1.24 mg/dL 8.29  5.62  1.30   Sodium 135 - 145 mmol/L 136  134  129   Potassium 3.5 - 5.1 mmol/L 3.7  4.1  3.9   Chloride 98 - 111 mmol/L 94  95  93   CO2 22 - 32 mmol/L 37  32  30   Calcium 8.9 - 10.3 mg/dL 8.9  8.8  8.7     Assessment/Plan:   Principal Problem:   Acute on chronic hypoxic respiratory failure (HCC) Active Problems:   COPD exacerbation (HCC)   Viral respiratory infection  Patient Summary: [name] [name] is a 63 y.o. with a pertinent PMH of COPD on 6 to 8 L at home, CKD 3a, hypertension, gout, who presented with dyspnea and hypoxia and admitted on 3/5 for acute hypoxic respiratory failure from COPD exacerbation secondary to coronavirus on HD#4.   Acute on chronic hypoxic respiratory failure COPD exacerbation, on 8 L baseline oxygen Coronavirus His lung exam with improved wheezing on scheduled nebulizers. He continues to have desaturations with ambulating.  This morning he desaturated into the 70s on 10 L and required 15 L to recover.  He is requiring increasing amounts of oxygen despite nebulized treatments and improvement in lung exam.  Echo was completed several days that showed worsened tricuspid regurg.  Right atrial pressure was not significantly elevated on echo.  My main concern is if this is progression of his chronic lung disease versus pulmonary hypertension.  He appears euvolemic on exam and admission BNP was less than 100 so I am not convinced hypoxia is related to volume.  -pulmonology consulted -Continue droplet  precaution -Continue prednisone 40 mg, day 4/ 5 -Continue scheduled LABA inhaler.  Will continue DuoNebs every 4 hours  History of severe pulmonary hypertension Severe tricuspid regurg Echo with moderate to severe tricuspid regurg.  He follows with Dr. Isaiah Serge and is thought to have group 2 versus 3 pulmonary hypertension.  -Appreciate pulmonology recommendations  Hypertension Blood pressures elevated to 140s 150s overnight.  Will restart home amlodipine 10 mg.  Holding metoprolol succinate 50 mg.  -Restart amlodipine 10 mg -Would consider discontinuing beta-blocker at discharge in setting of severe lung disease  Microcytic anemia No signs of acute bleed.  Likely in setting of acute illness.  Will need to follow-up with PCP as outpatient to repeat CBC. -Trend CBC  Chronic stable conditions: Gout-allopurinol 100 mg daily resumed Lipidemia-Zetia resumed CKD 3A-GFR actually greater than 60 here  Diet: Normal IVF: None,None VTE: NOAC Code: Full PT/OT recs: Home health PT/OT, Wheelchair, wheelchair cushion, bedside commode, rollator.  DME and home health ordered  Dispo: Anticipated discharge to Home improvement in oxygen requirements  Tyren Dugar M. Baldo Hufnagle, D.O.  Internal Medicine Resident, PGY-3 [facility]  Pager: [phone] 7:05 AM, 06/17/2023   **Please contact the on call pager after 5 pm and on weekends at [phone].**

## 2023-06-17 NOTE — Progress Notes (Addendum)
   06/17/23 0925  Mobility  Activity Ambulated with assistance in hallway  Level of Assistance Standby assist, set-up cues, supervision of patient - no hands on  Assistive Device Front wheel walker  Distance Ambulated (ft) 40 ft  Activity Response Tolerated fair  Mobility Referral Yes  Mobility visit 1 Mobility  Mobility Specialist Start Time (ACUTE ONLY) 0905  Mobility Specialist Stop Time (ACUTE ONLY) 0925  Mobility Specialist Time Calculation (min) (ACUTE ONLY) 20 min   Mobility Specialist: Progress Note  Pt agreeable to mobility session - received in bed. C/o SOB with exertion and back aches. Returned to [facility] with all needs met - call bell within reach.   Nurse requested Mobility Specialist to perform oxygen saturation test with pt which includes removing pt from oxygen both at rest and while ambulating.  Below are the results from that testing.     Patient Saturations on Room Air at Rest = spO2 78%  Patient Saturations on 15 Liters of oxygen while Ambulating = sp02 92%  At end of testing pt left in room on 10 Liters of oxygen. (Pt requesting titration d/t significant drop in SPO2 once sitting on BSC, pt with SOB.)  Reported results to nurse.   **Required O2 for ambulation may be between 10-15 LO2. Portable O2 cannot be titrated at a level between 10LO2 and 15LO2.    Barnie Mort, BS Mobility Specialist Please contact via SecureChat or  Rehab office at [phone].

## 2023-06-18 ENCOUNTER — Telehealth: Payer: Self-pay | Admitting: Pulmonary Disease

## 2023-06-18 DIAGNOSIS — J441 Chronic obstructive pulmonary disease with (acute) exacerbation: Secondary | ICD-10-CM | POA: Diagnosis not present

## 2023-06-18 DIAGNOSIS — J9621 Acute and chronic respiratory failure with hypoxia: Secondary | ICD-10-CM | POA: Diagnosis not present

## 2023-06-18 DIAGNOSIS — U071 COVID-19: Secondary | ICD-10-CM | POA: Diagnosis not present

## 2023-06-18 LAB — BASIC METABOLIC PANEL
Anion gap: 7 (ref 5–15)
BUN: 10 mg/dL (ref 8–23)
CO2: 35 mmol/L — ABNORMAL HIGH (ref 22–32)
Calcium: 9.2 mg/dL (ref 8.9–10.3)
Chloride: 93 mmol/L — ABNORMAL LOW (ref 98–111)
Creatinine, Ser: 1.05 mg/dL (ref 0.61–1.24)
GFR, Estimated: >60 mL/min (ref >60)
Glucose, Bld: 132 mg/dL — ABNORMAL HIGH (ref 70–99)
Potassium: 4.3 mmol/L (ref 3.5–5.1)
Sodium: 135 mmol/L (ref 135–145)

## 2023-06-18 LAB — BLOOD GAS, ARTERIAL
Acid-Base Excess: 9.9 mmol/L — ABNORMAL HIGH (ref 0.0–2.0)
Bicarbonate: 36.8 mmol/L — ABNORMAL HIGH (ref 20.0–28.0)
O2 Saturation: 96.3 %
Patient temperature: 37
pCO2 arterial: 58 mmHg — ABNORMAL HIGH (ref 32–48)
pH, Arterial: 7.41 (ref 7.35–7.45)
pO2, Arterial: 73 mmHg — ABNORMAL LOW (ref 83–108)

## 2023-06-18 LAB — CULTURE, BLOOD (ROUTINE X 2)
Culture: NO GROWTH
Special Requests: ADEQUATE

## 2023-06-18 LAB — CBC
HCT: 40.8 % (ref 39.0–52.0)
Hemoglobin: 12.3 g/dL — ABNORMAL LOW (ref 13.0–17.0)
MCH: 23.5 pg — ABNORMAL LOW (ref 26.0–34.0)
MCHC: 30.1 g/dL (ref 30.0–36.0)
MCV: 78 fL — ABNORMAL LOW (ref 80.0–100.0)
Platelets: 299 10*3/uL (ref 150–400)
RBC: 5.23 MIL/uL (ref 4.22–5.81)
RDW: 13.1 % (ref 11.5–15.5)
WBC: 14.4 10*3/uL — ABNORMAL HIGH (ref 4.0–10.5)
nRBC: 0 % (ref 0.0–0.2)

## 2023-06-18 NOTE — Progress Notes (Signed)
 Mobility Specialist Progress Note:   06/18/23 1133  Mobility  Activity Transferred to/from [facility]  Level of Assistance Standby assist, set-up cues, supervision of patient - no hands on  Assistive Device BSC  Distance Ambulated (ft) 2 ft  Activity Response Tolerated well  Mobility Referral Yes  Mobility visit 1 Mobility  Mobility Specialist Start Time (ACUTE ONLY) 1127  Mobility Specialist Stop Time (ACUTE ONLY) 1133  Mobility Specialist Time Calculation (min) (ACUTE ONLY) 6 min   Pt received needing assistance to transfer to and from [facility]. SBA. Tolerated well, asx throughout. Able to perform peri care independently. SpO2 84% on 10L while standing. SpO2 94% on 10L while sitting on BSC and EOB. Left pt sitting EOB with all needs met, call bell in reach.  Feliciana Rossetti Mobility Specialist Please contact via Special educational needs teacher or  Rehab office at [phone]

## 2023-06-18 NOTE — Progress Notes (Signed)
 Placed patient on bipap for the night

## 2023-06-18 NOTE — Telephone Encounter (Signed)
 PT was seen in hospital by Dr. Isaiah Serge today. He states before he goes home he needs Dr. Isaiah Serge to send in an order for a regulator for his POC. By having this he can get his 02 to 15 when mobil. They also need another concentrator. His # is [phone] if this is not clear.

## 2023-06-18 NOTE — Progress Notes (Signed)
 Occupational Therapy Treatment Patient Details Name: [name] [name] MRN: [id number] DOB: 1960-06-11 Today's Date: 06/18/2023   History of present illness Pt is a 63 yo male presenting to [facility] ED on 06/13/23 via EMS for acute on chronic hypoxic respiratory failure. PMH of COPD, CKD III, HTN, gout.   OT comments  Patient seated on BSC upon entry. Patient able to stand with supervision and used raised bed for support while pulling up clothing. Patient remained standing for gown change.  Energy conservation strategies handout provided to patient and reviewed with patient while standing from EOB. Patient left seated on EOB at end of session. Acute OT to continue to follow to address established goals to facilitate DC to next venue of care.       If plan is discharge home, recommend the following:  Assistance with cooking/housework;Assist for transportation;A little help with bathing/dressing/bathroom   Equipment Recommendations  Wheelchair (measurements OT);Wheelchair cushion (measurements OT);BSC/3in1;Other (comment) (rollator)    Recommendations for Other Services      Precautions / Restrictions Precautions Precautions: Fall Recall of Precautions/Restrictions: Intact Precaution/Restrictions Comments: watch 02 Restrictions Weight Bearing Restrictions Per Provider Order: No       Mobility Bed Mobility Overal bed mobility: Needs Assistance             General bed mobility comments: OOB on BSC upon entry and left seated on EOB    Transfers Overall transfer level: Modified independent Equipment used: None               General transfer comment: stood from [facility] and transferred to EOB without assistance     Balance Overall balance assessment: Needs assistance Sitting-balance support: Bilateral upper extremity supported, Feet supported Sitting balance-Leahy Scale: Good Sitting balance - Comments: EOB   Standing balance support: Single extremity supported, Bilateral upper  extremity supported, During functional activity Standing balance-Leahy Scale: Fair Standing balance comment: stood with raised bed for support                           ADL either performed or assessed with clinical judgement   ADL Overall ADL's : Needs assistance/impaired                 Upper Body Dressing : Minimal assistance;Standing Upper Body Dressing Details (indicate cue type and reason): using raised bed as support Lower Body Dressing: Minimal assistance;Sit to/from stand Lower Body Dressing Details (indicate cue type and reason): required assistance for balance while pulling up pants Toilet Transfer: Supervision/safety;Stand-pivot;BSC/3in1                  Extremity/Trunk Assessment              Vision       Perception     Praxis     Communication Communication Communication: No apparent difficulties   Cognition Arousal: Alert Behavior During Therapy: WFL for tasks assessed/performed Cognition: No apparent impairments                               Following commands: Intact        Cueing   Cueing Techniques: Verbal cues  Exercises      Shoulder Instructions       General Comments 10 liters O2 with SpO2 at 94% Patient given handout on energy conservation and reviewed with patient    Pertinent Vitals/ Pain       Pain  Assessment Pain Assessment: Faces Faces Pain Scale: No hurt Pain Intervention(s): Monitored during session  Home Living                                          Prior Functioning/Environment              Frequency  Min 2X/week        Progress Toward Goals  OT Goals(current goals can now be found in the care plan section)  Progress towards OT goals: Progressing toward goals  Acute Rehab OT Goals Patient Stated Goal: get better OT Goal Formulation: With patient Time For Goal Achievement: 06/29/23 Potential to Achieve Goals: Good ADL Goals Pt Will Perform  Grooming: with modified independence;sitting Pt Will Perform Lower Body Bathing: with modified independence;sitting/lateral leans;with adaptive equipment Pt Will Perform Lower Body Dressing: with modified independence;sit to/from stand Pt Will Transfer to Toilet: with modified independence;bedside commode;stand pivot transfer Additional ADL Goal #1: Pt will demonstrate independent use of energy conservation strategies to complete functional activities  Plan      Co-evaluation                 AM-PAC OT "6 Clicks" Daily Activity     Outcome Measure   Help from another person eating meals?: None Help from another person taking care of personal grooming?: A Little Help from another person toileting, which includes using toliet, bedpan, or urinal?: A Little Help from another person bathing (including washing, rinsing, drying)?: A Little Help from another person to put on and taking off regular upper body clothing?: A Little Help from another person to put on and taking off regular lower body clothing?: A Little 6 Click Score: 19    End of Session Equipment Utilized During Treatment: Oxygen (10 liters HFNC)  OT Visit Diagnosis: Other abnormalities of gait and mobility (R26.89)   Activity Tolerance Patient tolerated treatment well   Patient Left in bed;with call bell/phone within reach (seated on EOB)   Nurse Communication Mobility status        Time: 1610-9604 OT Time Calculation (min): 28 min  Charges: OT General Charges $OT Visit: 1 Visit OT Treatments $Self Care/Home Management : 8-22 mins $Therapeutic Activity: 8-22 mins  Alfonse Flavors, OTA Acute Rehabilitation Services  Office [phone]   Dewain Penning 06/18/2023, 10:33 AM

## 2023-06-18 NOTE — Progress Notes (Signed)
 Pt refused the use of BIPAP for the evening. RT explained why the MD ordered the BIPAP and what it is used for. Pt requested to speak with a doctor prior to BIPAP use, and stated he was afraid to use it.  BIPAP will be held at this time, pt will sleep on the salter this evening

## 2023-06-18 NOTE — Progress Notes (Signed)
 Physical Therapy Treatment Patient Details Name: [name] [name] MRN: [id number] DOB: October 03, 1960 Today's Date: 06/18/2023   History of Present Illness Pt is a 63 yo male presenting to [facility] ED on 06/13/23 via EMS for acute on chronic hypoxic respiratory failure. PMH of COPD, CKD III, HTN, gout.    PT Comments  Pt seated up EOB on arrival and agreeable to session with continues progress towards acute goals. Pt continues to be limited by decreased cardiopulmonary endurance with increased O2 demands with mobility. Physically pt requiring grossly supervision for all mobility with light cues for posture. Pt with preference for standing rest in tripod position with desat to low 80s% on 10L needing increase to 15L to recover >90%, however pt able to maintain >91% during gait on 10L and with cues for seated and standing rest with upright posture and able to maintain SpO2 88-95% on 10L during rest with upright posture. Pt continues to benefit from skilled PT services to progress toward functional mobility goals.      If plan is discharge home, recommend the following: Help with stairs or ramp for entrance;Assist for transportation   Can travel by private vehicle        Equipment Recommendations  Rollator (4 wheels);BSC/3in1;Wheelchair (measurements PT);Wheelchair cushion (measurements PT)    Recommendations for Other Services       Precautions / Restrictions Precautions Precautions: Fall Recall of Precautions/Restrictions: Intact Precaution/Restrictions Comments: watch 02 Restrictions Weight Bearing Restrictions Per Provider Order: No     Mobility  Bed Mobility Overal bed mobility: Needs Assistance             General bed mobility comments: seated up EOB    Transfers Overall transfer level: Modified independent Equipment used: Rolling walker (2 wheels)               General transfer comment: no assist needed. good hand placement    Ambulation/Gait Ambulation/Gait assistance:  Supervision Gait Distance (Feet): 60 Feet (x2) Assistive device: Rolling walker (2 wheels) Gait Pattern/deviations: Step-through pattern, Trunk flexed Gait velocity: decr     General Gait Details: cues for upright posture and closer RW proximity, SpO2 droppping intial 60' 86%on 10L increased to 15L with good recovery to 100%, dcreased back to 10L and pt able to maintain last 60' during ambulation >91%. pt sats noted to drop <85% when resting in tripod position, encouraged tahnding upright resting or seated rest with pt able increase >90% with improved posture   Stairs             Wheelchair Mobility     Tilt Bed    Modified Rankin (Stroke Patients Only)       Balance Overall balance assessment: Needs assistance Sitting-balance support: Bilateral upper extremity supported, Feet supported Sitting balance-Leahy Scale: Good Sitting balance - Comments: EOB   Standing balance support: Single extremity supported, Bilateral upper extremity supported, During functional activity Standing balance-Leahy Scale: Fair Standing balance comment: stood with raised bed for support                            Communication Communication Communication: No apparent difficulties  Cognition Arousal: Alert Behavior During Therapy: WFL for tasks assessed/performed   PT - Cognitive impairments: No apparent impairments                         Following commands: Intact      Cueing Cueing Techniques: Verbal cues  Exercises  General Comments        Pertinent Vitals/Pain Pain Assessment Pain Assessment: No/denies pain Pain Intervention(s): Monitored during session    Home Living                          Prior Function            PT Goals (current goals can now be found in the care plan section) Acute Rehab PT Goals PT Goal Formulation: With patient Time For Goal Achievement: 06/29/23 Progress towards PT goals: Progressing toward goals     Frequency    Min 3X/week      PT Plan      Co-evaluation              AM-PAC PT "6 Clicks" Mobility   Outcome Measure  Help needed turning from your back to your side while in a flat bed without using bedrails?: None Help needed moving from lying on your back to sitting on the side of a flat bed without using bedrails?: None Help needed moving to and from a bed to a chair (including a wheelchair)?: None Help needed standing up from a chair using your arms (e.g., wheelchair or bedside chair)?: None Help needed to walk in hospital room?: None Help needed climbing 3-5 steps with a railing? : A Little 6 Click Score: 23    End of Session Equipment Utilized During Treatment: Oxygen Activity Tolerance: Patient tolerated treatment well Patient left: in bed;with call bell/phone within reach;Other (comment) (seated EOB) Nurse Communication: Mobility status PT Visit Diagnosis: Other abnormalities of gait and mobility (R26.89)     Time: 1352-1416 PT Time Calculation (min) (ACUTE ONLY): 24 min  Charges:    $Therapeutic Exercise: 23-37 mins PT General Charges $$ ACUTE PT VISIT: 1 Visit                     Gustav Knueppel R. PTA Acute Rehabilitation Services Office: [phone]   Catalina Antigua 06/18/2023, 4:26 PM

## 2023-06-18 NOTE — Progress Notes (Addendum)
 Subjective SOB has improved but he is still unable to tolerate even minimal exertion.   Physical exam Blood pressure (!) 143/93, pulse 99, temperature 98.4 F (36.9 C), temperature source Oral, resp. rate 18, height 5\' 6"  (1.676 m), weight 92.6 kg, SpO2 95%.  Physical Exam: Constitutional: sitting up in bed, in no acute distress Cardiovascular: regular rate and rhythm, no m/r/g, no LEE Pulmonary/Chest: on 10 L [place], decreased air movement bilaterally, no wheezing Abdominal: soft, non-tender, non-distended Skin: warm and dry Psych: normal mood and behavior   Weight change:    Intake/Output Summary (Last 24 hours) at 06/18/2023 1307 Last data filed at 06/18/2023 1610 Gross per 24 hour  Intake 480 ml  Output 2050 ml  Net -1570 ml   Net IO Since Admission: -8,255 mL [06/18/23 1307]  Labs, images, and other studies    Latest Ref Rng & Units 06/18/2023    3:18 AM 06/17/2023    4:39 AM 06/16/2023    3:27 AM  CBC  WBC 4.0 - 10.5 K/uL 14.4  15.9  15.0   Hemoglobin 13.0 - 17.0 g/dL 96.0  45.4  09.8   Hematocrit 39.0 - 52.0 % 40.8  36.5  37.2   Platelets 150 - 400 K/uL 299  268  261        Latest Ref Rng & Units 06/18/2023    3:18 AM 06/17/2023    4:39 AM 06/15/2023    3:24 AM  BMP  Glucose 70 - 99 mg/dL 119  147  96   BUN 8 - 23 mg/dL 10  11  14    Creatinine 0.61 - 1.24 mg/dL 8.29  5.62  1.30   Sodium 135 - 145 mmol/L 135  136  134   Potassium 3.5 - 5.1 mmol/L 4.3  3.7  4.1   Chloride 98 - 111 mmol/L 93  94  95   CO2 22 - 32 mmol/L 35  37  32   Calcium 8.9 - 10.3 mg/dL 9.2  8.9  8.8      Assessment and plan Hospital day 5  [name] [name] is a 62 y.o.with a pertinent PMH of COPD on 8 L at home, CKD 3a, hypertension, gout, who presented with dyspnea and hypoxia and admitted on 3/5 for acute hypoxic respiratory failure from COPD exacerbation secondary to coronavirus on HD #5.   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 He continues to have significant desaturations with ambulating though overall this seems to gradually improving. Worked with mobility specialist today and dropped to 84% on 10L while ambulating 2 ft. ABG today showed pCO2 of 58. Non-acidotic as there is metabolic compensation with Bicarb 36.8. Per PCCM, extending steroid course and starting BiPAP tonight.  -Continue Prednisone 60 (day 5) -Continue Pulmicort, Duonebs q 6 -Continue droplet precautions -Consult   Hypertension Stable. Continue home amlodipine 10 mg.  Holding metoprolol succinate 50 mg.   Microcytic anemia Stable at 12.3. No signs of acute bleed. Likely related to acute illness.    Chronic stable conditions: Gout-allopurinol 100 mg daily resumed Lipidemia-Zetia resumed CKD 3A-GFR  actually greater than 60 here   Diet: Normal VTE: DOAC Code: Full PT/OT recs: Home health PT/OT, Wheelchair, wheelchair cushion, bedside commode, rollator.  DME and home health ordered   Dispo: Anticipated discharge to Home improvement in oxygen requirements  Carmina Miller, DO 06/18/2023, 1:07 PM  Pager: [phone] After 5pm or weekend: [phone]

## 2023-06-18 NOTE — Progress Notes (Signed)
 Pt is on schedule duonebs. Ok to [employer] per Dr. Fara Boros, PharmD, BCIDP, AAHIVP, CPP Infectious Disease Pharmacist 06/18/2023 2:14 PM

## 2023-06-18 NOTE — Progress Notes (Addendum)
 NAME:  [name] [name], MRN:  [id number], DOB:  [birth year], LOS: 5 ADMISSION DATE:  06/13/2023, CONSULTATION DATE:  06/17/23 REFERRING MD:  Rudene Christians, DO CHIEF COMPLAINT:  End stage COPD   History of Present Illness:  63 year old male with COPD on 6-8L O2 at home, CKD IIIA, HTN, gout admitted to [facility] on 3/5 for acute hypoxemic respiratory failure secondary to coronavirus after presenting with 1 week of worsening shortness of breath and desaturations on 10L O2. PCCM consulted for high O2 requirements.  While in the hospital at rest, he is able to tolerate ~9L O2 via [place] however with ambulation he requires up to 15L O2 associated with dyspnea with short distances. Able to speak at rest with no desaturations but does still have shortness of breath with conversation. He is frustrated at his condition and states he has never considered future steps if his COPD were to worsen. He is currently followed by Dr. Isaiah Serge in clinic for COPD and on Symbicort and Spiriva and chronic O2 use ~6L. Prior PFTs with FEV1 37% in 2017 and DLCO 38%. Last COPD exacerbation includes hospitalization in Feb 2024 secondary to COVID pna.  Pertinent  Medical History   COPD on 6-8L O2 at home, CKD IIIA, HTN, gout  Significant Hospital Events: Including procedures, antibiotic start and stop dates in addition to other pertinent events     Interim History / Subjective:  As above  Objective   Blood pressure (!) 143/93, pulse 99, temperature 98.4 F (36.9 C), temperature source Oral, resp. rate 18, height 5\' 6"  (1.676 m), weight 92.6 kg, SpO2 95%.        Intake/Output Summary (Last 24 hours) at 06/18/2023 1216 Last data filed at 06/18/2023 4098 Gross per 24 hour  Intake 480 ml  Output 2050 ml  Net -1570 ml   Filed Weights   06/13/23 1156  Weight: 92.6 kg   Physical Exam: General: Chronically ill-appearing, no acute distress, dyspneic with conversation HENT: [place], AT, OP clear, MMM Eyes: EOMI, no scleral  icterus Respiratory: Diminished to auscultation bilaterally.  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 Will extend steroid course. His PH is likely contributing however limited options as this is likely secondary to his chronic lung disease. If qualifies he would benefit from NIV therapy to help with oxygen requirement so will check for chronic hypercarbia. Adding ohtuvayre would also be a reasonable option for him (PDE3 and PDE4 blocker) however unable to obtain inpatient.  -- Continue prednisone at 60 mg daily -- Brovana, Pulmicort, scheduled DuoNebs -- Pulmonary hygiene, incentive spirometer, mobilize --Does have evidence of chronic hypercarbia on ABG and may benefit from NIV.  Will start BiPAP tonight and see how he responds --Will also benefit from pulmonary rehab but patient has refused in the past as he does not want to get out of the house --Agree with palliative care consult. Currently full code  Best Practice (right click and "Reselect all SmartList Selections" daily)   Per primary  Critical care time: n/a   Chilton Greathouse MD [place]  Pulmonary & Critical care See Amion for pager  If no response to pager , please call [phone] until 7pm After 7:00 pm call Elink  [phone] 06/18/2023, 12:18 PM

## 2023-06-18 NOTE — Telephone Encounter (Signed)
 Called and spoke to patient.  He stated that he currently admitted. He stated that he needs a order for a larger concentrator. He's on 10L at rest and 15 with exertion.  [id number]  Dr. Isaiah Serge, please advise. Thanks

## 2023-06-19 DIAGNOSIS — J449 Chronic obstructive pulmonary disease, unspecified: Secondary | ICD-10-CM | POA: Diagnosis not present

## 2023-06-19 DIAGNOSIS — I272 Pulmonary hypertension, unspecified: Secondary | ICD-10-CM

## 2023-06-19 DIAGNOSIS — B9789 Other viral agents as the cause of diseases classified elsewhere: Secondary | ICD-10-CM

## 2023-06-19 DIAGNOSIS — Z66 Do not resuscitate: Secondary | ICD-10-CM | POA: Diagnosis not present

## 2023-06-19 DIAGNOSIS — M1A9XX0 Chronic gout, unspecified, without tophus (tophi): Secondary | ICD-10-CM | POA: Diagnosis not present

## 2023-06-19 DIAGNOSIS — Z7189 Other specified counseling: Secondary | ICD-10-CM | POA: Diagnosis not present

## 2023-06-19 DIAGNOSIS — J988 Other specified respiratory disorders: Secondary | ICD-10-CM | POA: Diagnosis not present

## 2023-06-19 DIAGNOSIS — U071 COVID-19: Secondary | ICD-10-CM | POA: Diagnosis not present

## 2023-06-19 DIAGNOSIS — Z515 Encounter for palliative care: Secondary | ICD-10-CM | POA: Diagnosis not present

## 2023-06-19 DIAGNOSIS — J9621 Acute and chronic respiratory failure with hypoxia: Secondary | ICD-10-CM | POA: Diagnosis not present

## 2023-06-19 DIAGNOSIS — J441 Chronic obstructive pulmonary disease with (acute) exacerbation: Secondary | ICD-10-CM | POA: Diagnosis not present

## 2023-06-19 LAB — BLOOD GAS, ARTERIAL
Acid-Base Excess: 13.7 mmol/L — ABNORMAL HIGH (ref 0.0–2.0)
Bicarbonate: 39.5 mmol/L — ABNORMAL HIGH (ref 20.0–28.0)
O2 Saturation: 89.3 %
Patient temperature: 37
pCO2 arterial: 53 mmHg — ABNORMAL HIGH (ref 32–48)
pH, Arterial: 7.48 — ABNORMAL HIGH (ref 7.35–7.45)
pO2, Arterial: 60 mmHg — ABNORMAL LOW (ref 83–108)

## 2023-06-19 MED ORDER — PREDNISONE 20 MG PO TABS
40.0000 mg | ORAL_TABLET | Freq: Every day | ORAL | Status: DC
  Administered 2023-06-20: 40 mg via ORAL
  Filled 2023-06-19: qty 2

## 2023-06-19 MED ORDER — IPRATROPIUM-ALBUTEROL 0.5-2.5 (3) MG/3ML IN SOLN
3.0000 mL | RESPIRATORY_TRACT | Status: DC | PRN
  Administered 2023-06-20: 3 mL via RESPIRATORY_TRACT
  Filled 2023-06-19: qty 3

## 2023-06-19 NOTE — Plan of Care (Signed)

## 2023-06-19 NOTE — Plan of Care (Signed)
  Problem: Clinical Measurements: Goal: Will remain free from infection Outcome: Progressing   Problem: Clinical Measurements: Goal: Diagnostic test results will improve Outcome: Progressing   Problem: Clinical Measurements: Goal: Respiratory complications will improve Outcome: Progressing   Problem: Coping: Goal: Level of anxiety will decrease Outcome: Progressing   Problem: Nutrition: Goal: Adequate nutrition will be maintained Outcome: Progressing   Problem: Safety: Goal: Ability to remain free from injury will improve Outcome: Progressing   Problem: Skin Integrity: Goal: Risk for impaired skin integrity will decrease Outcome: Progressing

## 2023-06-19 NOTE — TOC Initial Note (Signed)
 Transition of Care (TOC) - Initial/Assessment Note  Donn Pierini RN, BSN Transitions of Care Unit 4E- RN Case Manager See Treatment Team for direct phone #   Patient Details  Name: [name] [name] MRN: [id number] Date of Birth: 1961/03/28  Transition of Care [facility]) CM/SW Contact:    Darrold Span, RN Phone Number: 06/19/2023, 2:45 PM  Clinical Narrative:                 Pt from home w/ family, has home 02 w/ [employer].  CM in to speak with pt at bedside. Per pt his baseline 02 is 6-8L- voiced that he has concentrator at home that goes to 10L, and portable 02 that goes to 8L. He states that he has spoken with Dr. Shirlee More office regarding his increased 02 needs and that someone is working on getting him what he needs for home.   CM notes that pt will need NIV/BIPAP for home. Have spoken with [employer] liaison who is working on reviewing for insurance and will send order for MD to sign once they have verified pt meets criteria for NIV.   Orders have been placed for HH/DME needs- pt voiced that someone from [employer] has already called him about RW and wheelchair- explained that insurance will not cover both- he has elected to have insurance cover wheelchair for now, and will get RW if needed paying out of pocket.   List provided for [facility] needs Per CMS guidelines from [employer] website with star ratings (copy placed in shadow chart)- pt voiced he does not have a preference and defers to this writer to secure on his behalf.   States daughter will transport home.   Call made to [facility] liaison for [facility] referral- referral has been accepted- however due to staffing anticipate delay in start of care- likely first of next week.   [employer] - following for home NIV needs- pending order - paper order on chart for signature.   Expected Discharge Plan: Home w Home Health Services Barriers to Discharge: Continued Medical Work up, Insurance Authorization   Patient Goals and CMS Choice Patient  states their goals for this hospitalization and ongoing recovery are:: return home CMS Medicare.gov Compare Post Acute Care list provided to:: Patient Choice offered to / list presented to : Patient      Expected Discharge Plan and Services   Discharge Planning Services: CM Consult Post Acute Care Choice: Home Health, Durable Medical Equipment Living arrangements for the past 2 months: Single Family Home                 DME Arranged: NIV, Wheelchair manual DME Agency: [employer] Date DME Agency Contacted: 06/19/23 Time DME Agency Contacted: [phone] Representative spoke with at DME Agency: Mitch HH Arranged: PT, OT HH Agency: [facility] Date [facility] Agency Contacted: 06/19/23 Time HH Agency Contacted: 1445 Representative spoke with at [facility] Agency: Clifton Custard  Prior Living Arrangements/Services Living arrangements for the past 2 months: Single Family Home Lives with:: Self, Adult Children Patient language and need for interpreter reviewed:: Yes Do you feel safe going back to the place where you live?: Yes      Need for Family Participation in Patient Care: Yes (Comment) Care giver support system in place?: Yes (comment) Current home services: DME (home 02- [employer]) Criminal Activity/Legal Involvement Pertinent to Current Situation/Hospitalization: No - Comment as needed  Activities of Daily Living   ADL Screening (condition at time of admission) Independently performs ADLs?: Yes (appropriate for developmental age) Is  the patient deaf or have difficulty hearing?: No Does the patient have difficulty seeing, even when wearing glasses/contacts?: No Does the patient have difficulty concentrating, remembering, or making decisions?: Yes  Permission Sought/Granted Permission sought to share information with : Facility Industrial/product designer granted to share information with : Yes, Verbal Permission Granted     Permission granted to share info w AGENCY: DME/HH         Emotional Assessment Appearance:: Appears stated age Attitude/Demeanor/Rapport: Engaged Affect (typically observed): Accepting Orientation: : Oriented to Self, Oriented to Place, Oriented to  Time, Oriented to Situation Alcohol / Substance Use: Not Applicable Psych Involvement: No (comment)  Admission diagnosis:  COPD exacerbation (HCC) [J44.1] Acute respiratory failure with hypoxia (HCC) [J96.01] Acute on chronic hypoxic respiratory failure (HCC) [J96.21] Patient Active Problem List   Diagnosis Date Noted   Viral respiratory infection 06/14/2023   Acute on chronic hypoxic respiratory failure (HCC) 06/13/2023   Acute on chronic respiratory failure with hypoxia (HCC) 05/10/2022   COVID-19 virus infection 05/10/2022   Statin intolerance 04/20/2021   Hyperlipidemia 04/19/2021   Acute hypoxemic respiratory failure due to COVID-19 (HCC) 12/04/2020   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   Chronic respiratory failure with hypoxia (HCC) 10/30/2016   Class 2 obesity 09/17/2015   Bilateral carpal tunnel syndrome 09/09/2015   Ulnar neuropathy of both upper extremities 09/09/2015   Numbness of left hand 01/21/2015   Abnormal EKG 10/27/2014   Moderate to severe pulmonary hypertension (HCC) 12/14/2013   COPD exacerbation (HCC) 12/11/2013   Cocaine abuse in remission (HCC) 12/11/2013   Hypertension 12/11/2013   COPD bronchitis 08/08/2012   Gout 08/08/2012   CKD (chronic kidney disease) stage 2, GFR 60-89 ml/min 08/03/2012   Tobacco abuse, in remission 07/29/2012   PCP:  Hoy Register, MD Pharmacy:   [facility] [address] [place] [address] Phone: [phone] Fax: [phone]  [facility] [address] [place]), [place] - [address] [place] [address] Phone: [phone] Fax: [phone]     Social Drivers of Health (SDOH) Social History: SDOH Screenings   Food  Insecurity: No Food Insecurity (06/13/2023)  Housing: Low Risk  (06/13/2023)  Transportation Needs: Unmet Transportation Needs (06/13/2023)  Utilities: Not At Risk (06/13/2023)  Depression (PHQ2-9): Low Risk  (05/25/2022)  Tobacco Use: Medium Risk (06/13/2023)   SDOH Interventions:     Readmission Risk Interventions     No data to display

## 2023-06-19 NOTE — TOC CM/SW Note (Signed)
The patient is able to protect their airways and clear secretions adequately.

## 2023-06-19 NOTE — Progress Notes (Signed)
 Mobility Specialist Progress Note:   06/19/23 1202  Mobility  Activity Ambulated with assistance in hallway  Level of Assistance Standby assist, set-up cues, supervision of patient - no hands on  Assistive Device Front wheel walker  Distance Ambulated (ft) 120 ft  Activity Response Tolerated well  Mobility Referral Yes  Mobility visit 1 Mobility  Mobility Specialist Start Time (ACUTE ONLY) 1145  Mobility Specialist Stop Time (ACUTE ONLY) 1200  Mobility Specialist Time Calculation (min) (ACUTE ONLY) 15 min   Pt received in bed, agreeable to mobility session. Ambulated in hallway with RW. SpO2 100% on 10L before mobility session. During ambulation, SpO2 86% on 10L, took several standing rest breaks, recovered, SpO2 91% on 10L. Upon arrival to room, Pt required increase of O2 flow, SpO2 95% on 15L. Pt sitting EOB with all need met   Feliciana Rossetti Mobility Specialist Please contact via SecureChat or  Rehab office at [phone]

## 2023-06-19 NOTE — Consult Note (Addendum)
 Palliative Care Consult Note                                  Date: 06/19/2023   Patient Name: [name] [name]  DOB: 16-Jul-1960  MRN: [id number]  Age / Sex: 63 y.o., male  PCP: Hoy Register, MD Referring Physician: Mercie Eon, MD  Reason for Consultation: Establishing goals of care  HPI/Patient Profile: 63 y.o. male  with past medical history of COPD on 8 L at home, CKD 3a, hypertension, gout, who presented with dyspnea and hypoxia. He was admitted on 06/13/2023 with acute on chronic hypoxic respiratory failure secondary to coronavirus, COPD exacerbation on 8 L nasal oxygen, probable pulmonary hypertension secondary who group II/III, and others.   Palliative medicine was consulted for GOC conversations.  Past Medical History:  Diagnosis Date   Asthma 04/10/1997   CKD (chronic kidney disease), 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   COPD (chronic obstructive pulmonary disease) (HCC)    on 4L home O2   Essential hypertension    Gout    Thrombocytopenia (HCC) 07/28/2012    Subjective:   This NP Wynne Dust reviewed medical records, received report from team, assessed the patient and then meet at the patient's bedside to discuss diagnosis, prognosis, GOC, EOL wishes disposition and options.  I met with the patient at bedside, no family was present.   We meet to discuss diagnosis prognosis, GOC, EOL wishes, disposition and options. Concept of Palliative Care was introduced as specialized medical care for people and their families living with serious illness.  If focuses on providing relief from the symptoms and stress of a serious illness.  The goal is to improve quality of life for both the patient and the family. Values and goals of care important to patient and family were attempted to be elicited.  Created space and opportunity for patient  and family to explore thoughts and feelings regarding current medical situation   Natural trajectory and current  clinical status were discussed. Questions and concerns addressed. Patient  encouraged to call with questions or concerns.    Patient/Family Understanding of Illness: The patient is a very firm understanding about his illness.  He knows that he has advanced COPD.  He is in the hospital because this is exacerbated by coronavirus.  He states he has had COVID twice.  He knows that there is some damage studies irreversible and they are trying to maintain him as best as possible.  He feels that his disease is not actively progressing.  He laments that he "did this to myself" working as a Curator around fumes and also smoking.  We spent some time detailing the current and chronic clinical situation to further his understanding.  Life Review: The patient was married but his wife passed away 3 years ago from similar diseases.  He was married for 40 some years.  He has a son and 3 daughters.  1 son and 1 daughter live locally, 1 daughter is moving here soon, and 1 daughter lives in [place].  He is a very religious and spiritual man.  Patient Values: Family, faith  Goals: The patient's goal is "to pray for healing and make the best of it in the meantime."  After discussion he agreed to DNR/DNI  Today's Discussion: In addition to discussions described above we had an extensive discussion on various topics.  We had a lot of faith-based discussion and the patient's trust in God  and his ability to heal, although he knows that we do not always get what we are asking for.  He laments how he "did this to myself" by smoking and working as a Curator around a lot of fumes.  Overall he feels pretty good, other when he gets short of breath.  He describes an anxiety or panic feeling when he gets dyspneic although he is working on controlling that.  He tries to look at the bright side of life.  He shares that he is going to try to take an herb Frutoso Schatz") to try to heal what is left of his lungs.  We spent a lot of  time talking about anticipatory care and the what-ifs of the future given his advanced COPD.  He wants to continue treatments at this time.  However, he states "when it is my time then I want to die at home."  He knows that nobody wants to die, but everyone wants to go to heaven.  We laughed at the irony that you need to do one to do the other.  We spent time talking about CODE STATUS.  The patient was very clear, and I confirmed twice, that if he has a cardiopulmonary arrest he would not want resuscitation.  Additionally, if his breathing got so bad that he needed a breathing tube he would not want this stating "I would not want to live like that and not sure I would come off the breathing tube with my lungs this bad."  I shared that statistically he is correct.  After our discussion I confirmed his desire to be DNR/DNI.  I shared that short of cardiopulmonary arrest we would continue all available medications and treatments in an attempt to stabilize and improve his condition.  He verbalized understanding.  Finally I offered and recommended outpatient palliative care.  I shared that they can help maximize his quality of life while he is continuing down his treatment and illness path.  I shared that they can oftentimes help with dyspnea and anxiety related to dyspnea.  He is in agreement with this.  I shared that I am off tomorrow but if he is still admitted to the hospital on Thursday I would come back to check on him. I provided emotional and general support through therapeutic listening, empathy, sharing of stories, and other techniques. I answered all questions and addressed all concerns to the best of my ability.  Review of Systems  Respiratory:  Positive for chest tightness (when short of breath with activity) and shortness of breath (with activity).   Gastrointestinal:  Negative for abdominal pain, nausea and vomiting.    Objective:   Primary Diagnoses: Present on Admission:  Acute on  chronic hypoxic respiratory failure (HCC)  COPD exacerbation (HCC)  Viral respiratory infection  Moderate to severe pulmonary hypertension (HCC)  Hypertension  Gout   Physical Exam Vitals and nursing note reviewed.  Constitutional:      General: He is not in acute distress.    Appearance: He is ill-appearing. He is not toxic-appearing.  HENT:     Head: Normocephalic and atraumatic.  Cardiovascular:     Rate and Rhythm: Normal rate.  Pulmonary:     Effort: Pulmonary effort is normal. No respiratory distress.  Abdominal:     General: Abdomen is flat. There is no distension.     Palpations: Abdomen is soft.     Tenderness: There is no abdominal tenderness.  Skin:    General: Skin is warm and  dry.  Neurological:     General: No focal deficit present.     Mental Status: He is alert and oriented to person, place, and time.  Psychiatric:        Mood and Affect: Mood normal.        Behavior: Behavior normal.     Vital Signs:  BP 136/81 (BP Location: Right Arm)   Pulse (!) 104   Temp 98.2 F (36.8 C) (Oral)   Resp 14   Ht 5\' 6"  (1.676 m)   Wt 92.6 kg   SpO2 100%   BMI 32.95 kg/m   Palliative Assessment/Data: 60-70%    Advanced Care Planning:   Existing Vynca/ACP Documentation: None  Primary Decision Maker: PATIENT  Code Status/Advance Care Planning: DNR-Limited  A discussion was had today regarding advanced directives. Concepts specific to code status, artifical feeding and hydration, continued IV antibiotics and rehospitalization was had.  The difference between a aggressive medical intervention path and a palliative comfort care path for this patient at this time was had.   Decisions/Changes to ACP: Changed to DNR-Limited  Assessment & Plan:   Impression: 63 year old male with acute presentation chronic comorbidities as described above.  The patient has advanced COPD with exacerbation triggered by COVID infection.  He continues to make respiratory  improvement this admission.  After discussion the patient has agreed to DNR/DNI and outpatient palliative care for ongoing symptom management and support on his illness during.  Overall long-term prognosis poor.  SUMMARY OF RECOMMENDATIONS   Changed to DNR/DNI (DNR-limited) Continue full scope of care otherwise [facility] consult for outpatient palliative care referral Palliative medicine will follow-up in 2 days if he remains admitted Please notify us of any significant clinical change or new palliative needs in the interim  Symptom Management:  Per primary team PMT is available to assist as needed  Prognosis:  Unable to determine  Discharge Planning:  To Be Determined   Discussed with: Patient, family, medical team, nursing team, [facility] team    Thank you for allowing [place] to participate in the care of Cohan Stipes PMT will continue to support holistically.  Time Total: 78 min  Detailed review of medical records (labs, imaging, vital signs), medically appropriate exam, discussed with treatment team, counseling and education to patient, family, & staff, documenting clinical information, medication management, coordination of care  Signed by: Wynne Dust, NP Palliative Medicine Team  Team Phone # [phone] (Nights/Weekends)  06/19/2023, 3:33 PM

## 2023-06-19 NOTE — Progress Notes (Signed)
                 Subjective SOB has improved. He tolerated BiPap last night.  Physical exam Blood pressure 133/89, pulse (!) 107, temperature 98.1 F (36.7 C), temperature source Oral, resp. rate (!) 21, height 5\' 6"  (1.676 m), weight 92.6 kg, SpO2 97%.  Constitutional: sitting up in bed, in no acute distress Cardiovascular: regular rate and rhythm, no m/r/g, no LEE Pulmonary/Chest: on 10 L HFNC 60% FiO2, decreased air movement bilaterally, no wheezing Abdominal: soft, non-tender, non-distended Skin: warm and dry Psych: normal mood and behavior  Weight change:    Intake/Output Summary (Last 24 hours) at 06/19/2023 1205 Last data filed at 06/19/2023 1100 Gross per 24 hour  Intake 240 ml  Output 400 ml  Net -160 ml   Net IO Since Admission: -8,415 mL [06/19/23 1205]  Labs, images, and other studies    Latest Ref Rng & Units 06/18/2023    3:18 AM 06/17/2023    4:39 AM 06/16/2023    3:27 AM  CBC  WBC 4.0 - 10.5 K/uL 14.4  15.9  15.0   Hemoglobin 13.0 - 17.0 g/dL 57.8  46.9  62.9   Hematocrit 39.0 - 52.0 % 40.8  36.5  37.2   Platelets 150 - 400 K/uL 299  268  261        Latest Ref Rng & Units 06/18/2023    3:18 AM 06/17/2023    4:39 AM 06/15/2023    3:24 AM  BMP  Glucose 70 - 99 mg/dL 528  413  96   BUN 8 - 23 mg/dL 10  11  14    Creatinine 0.61 - 1.24 mg/dL 2.44  0.10  2.72   Sodium 135 - 145 mmol/L 135  136  134   Potassium 3.5 - 5.1 mmol/L 4.3  3.7  4.1   Chloride 98 - 111 mmol/L 93  94  95   CO2 22 - 32 mmol/L 35  37  32   Calcium 8.9 - 10.3 mg/dL 9.2  8.9  8.8      Assessment and plan Hospital day 6  [name] [name] is a 63 y.o.male .with a pertinent PMH of COPD on 8 L at home, CKD 3a, hypertension, gout, who presented with dyspnea and hypoxia and admitted on 3/5 for acute hypoxic respiratory failure from COPD exacerbation secondary to coronavirus.  Acute on chronic hypoxic respiratory failure 2/2 Coronavirus COPD exacerbation, on 8 L baseline oxygen Probable PH,  secondary WHO group II, III  PCCM following, appreciate recommendations. He continues to have significant desaturations with ambulating though overall this seems to gradually improving. He tolerated BiPaP last night and hypercapnia did improve on ABG. PCCM and we agree he is a good candidate for NIV given progression of his COPD and increased O2 requirements. Per PCCM, will start Prednisone taper. TOC working on getting NIV approved prior to discharge. -Continue Prednisone and reduce to 40 mg starting tomorrow (today is day 6) -Continue Pulmicort, Duonebs q 6 -Continue droplet precautions  Hypertension Stable. Continue home amlodipine 10 mg.  Holding metoprolol succinate 50 mg.   Chronic stable conditions: Gout-allopurinol 100 mg daily resumed Lipidemia-Zetia resumed CKD 3A-GFR actually greater than 60 here   Diet: Normal VTE: DOAC Code: Full PT/OT recs: Home health PT/OT, Wheelchair, wheelchair cushion, bedside commode, rollator.  DME and home health ordered  Carmina Miller, DO 06/19/2023, 12:05 PM  Pager: [phone] After 5pm or weekend: [phone]

## 2023-06-19 NOTE — Progress Notes (Signed)
 NAME:  [name] [name], MRN:  [id number], DOB:  1960/11/06, LOS: 6 ADMISSION DATE:  06/13/2023, CONSULTATION DATE:  06/17/23 REFERRING MD:  Rudene Christians, DO CHIEF COMPLAINT:  End stage COPD   History of Present Illness:  63 year old male with COPD on 6-8L O2 at home, CKD IIIA, HTN, gout admitted to [facility] on 3/5 for acute hypoxemic respiratory failure secondary to coronavirus after presenting with 1 week of worsening shortness of breath and desaturations on 10L O2. PCCM consulted for high O2 requirements.  While in the hospital at rest, he is able to tolerate ~9L O2 via [place] however with ambulation he requires up to 15L O2 associated with dyspnea with short distances. Able to speak at rest with no desaturations but does still have shortness of breath with conversation. He is frustrated at his condition and states he has never considered future steps if his COPD were to worsen. He is currently followed by Dr. Isaiah Serge in clinic for COPD and on Symbicort and Spiriva and chronic O2 use ~6L. Prior PFTs with FEV1 37% in 2017 and DLCO 38%. Last COPD exacerbation includes hospitalization in Feb 2024 secondary to COVID pna.  Pertinent  Medical History   COPD on 6-8L O2 at home, CKD IIIA, HTN, gout  Significant Hospital Events: Including procedures, antibiotic start and stop dates in addition to other pertinent events     Interim History / Subjective:   Tolerated BiPAP well.  Remains on 10 L oxygen  Objective   Blood pressure 133/89, pulse (!) 107, temperature 98.1 F (36.7 C), temperature source Oral, resp. rate (!) 21, height 5\' 6"  (1.676 m), weight 92.6 kg, SpO2 97%.    FiO2 (%):  [60 %] 60 %   Intake/Output Summary (Last 24 hours) at 06/19/2023 1140 Last data filed at 06/19/2023 1100 Gross per 24 hour  Intake 240 ml  Output 400 ml  Net -160 ml   Filed Weights   06/13/23 1156  Weight: 92.6 kg   Physical Exam: Gen:      No acute distress HEENT:  EOMI, sclera anicteric Neck:     No masses; no  thyromegaly Lungs:   Diminished air entry CV:         Regular rate and rhythm; no murmurs Abd:      + bowel sounds; soft, non-tender; no palpable masses, no distension Ext:    No edema; adequate peripheral perfusion Skin:      Warm and dry; no rash Neuro: alert and oriented x 3 Psych: normal mood and affect    Resolved Hospital Problem list   N/A  Assessment & Plan:  Acute on chronic hypoxemic respiratory failure in setting of coronovirus OC43 Severe COPD in exacerbation 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 Will extend steroid course. His PH is likely contributing however limited options as this is likely secondary to his chronic lung disease. If qualifies he would benefit from NIV therapy to help with oxygen requirement so will check for chronic hypercarbia. Adding ohtuvayre would also be a reasonable option for him (PDE3 and PDE4 blocker) however unable to obtain inpatient.  -- Continue prednisone at 60 mg daily.  Can start taper by 10 mg every 2 days -- Brovana, Pulmicort, scheduled DuoNebs -- Pulmonary hygiene, incentive spirometer, mobilize -- I will work with pulmonary office to see if he can get additional concentrators to maintain high level of O2 need -- Patient continues to  exhibit signs of hypercapnia associated with chronic respiratory failure secondary to COPD.  Interruption or failure to provide NIV would quickly lead to exacerbation of the patient's condition, hospital admission, and likely harm the patient.  Continued use is preferred.  The use of the NIV will treat patient's high PCO2 levels and can reduce risk of exacerbations and future hospitalizations when used at night and during the day.  BiLevel/RAD has been considered and ruled out as patient requires continuous alarms, backup rate, battery, and portability which are not possible with BiLevel/RAD devices.  Ventilation is  required to decrease the work of breathing and improve pulmonary status. Interruption of ventilator support would lead to decline of health status.  Patient is able to protect their airways and clear secretions on their own.  --Will also benefit from pulmonary rehab but patient has refused in the past as he does not want to get out of the house --Agree with palliative care consult. Currently full code.  We discussed DNR status today but he is not ready to make the decision yet.  Best Practice (right click and "Reselect all SmartList Selections" daily)   Per primary  Critical care time: n/a   Chilton Greathouse MD  Pulmonary & Critical care See Amion for pager  If no response to pager , please call [phone] until 7pm After 7:00 pm call Elink  [phone] 06/19/2023, 11:40 AM

## 2023-06-19 NOTE — Telephone Encounter (Signed)
 I was able to speak to the patient in the hospital.  He is currently requiring 10 L with rest and 15 L on exertion Can we order 2 concentrators as he is unable to keep up liters per minute with 1 concentrator. He will also need a flow valve that can handle high levels of oxygen and Oxymizer.

## 2023-06-20 ENCOUNTER — Other Ambulatory Visit (HOSPITAL_COMMUNITY): Payer: Self-pay

## 2023-06-20 ENCOUNTER — Telehealth: Payer: Self-pay | Admitting: Pulmonary Disease

## 2023-06-20 ENCOUNTER — Telehealth (HOSPITAL_COMMUNITY): Payer: Self-pay | Admitting: Pharmacy Technician

## 2023-06-20 ENCOUNTER — Ambulatory Visit: Payer: Medicaid Other | Admitting: Family Medicine

## 2023-06-20 DIAGNOSIS — U071 COVID-19: Secondary | ICD-10-CM | POA: Diagnosis not present

## 2023-06-20 DIAGNOSIS — J441 Chronic obstructive pulmonary disease with (acute) exacerbation: Secondary | ICD-10-CM | POA: Diagnosis not present

## 2023-06-20 DIAGNOSIS — J9621 Acute and chronic respiratory failure with hypoxia: Secondary | ICD-10-CM | POA: Diagnosis not present

## 2023-06-20 DIAGNOSIS — J449 Chronic obstructive pulmonary disease, unspecified: Secondary | ICD-10-CM | POA: Diagnosis not present

## 2023-06-20 DIAGNOSIS — J9610 Chronic respiratory failure, unspecified whether with hypoxia or hypercapnia: Secondary | ICD-10-CM | POA: Diagnosis not present

## 2023-06-20 MED ORDER — PREDNISONE 20 MG PO TABS
ORAL_TABLET | ORAL | 0 refills | Status: AC
  Filled 2023-06-20: qty 10, 8d supply, fill #0

## 2023-06-20 MED ORDER — PREDNISONE 20 MG PO TABS
ORAL_TABLET | ORAL | 0 refills | Status: DC
  Filled 2023-06-20: qty 25, 21d supply, fill #0

## 2023-06-20 NOTE — Plan of Care (Signed)

## 2023-06-20 NOTE — Telephone Encounter (Signed)
 Spoke with patient in regard to scheduling 2-4 week follow up per Dr. Isaiah Serge- patient will call back when he knows his daughters schedule. Will follow up with patient if not scheduled

## 2023-06-20 NOTE — Discharge Summary (Signed)
 Name: [name] [name] MRN: [id number] DOB: 02-18-1961 63 y.o. PCP: Hoy Register, MD  Date of Admission: 06/13/2023 11:43 AM Date of Discharge: 06/20/2023  Attending Physician: Dr.  Mercie Eon  DISCHARGE DIAGNOSIS:  Primary Problem: Acute on chronic hypoxic respiratory failure [facility])   Hospital Problems: Principal Problem:   Acute on chronic hypoxic respiratory failure (HCC) Active Problems:   Gout   COPD exacerbation (HCC)   Hypertension   Moderate to severe pulmonary hypertension (HCC)   Viral respiratory infection    DISCHARGE MEDICATIONS:   Allergies as of 06/20/2023       Reactions   Atorvastatin Itching, Swelling   Facial, tongue swelling   Shellfish Allergy Anaphylaxis   Beef-derived Drug Products    Intolerance due to gout   Colchicine Hives        Medication List     PAUSE taking these medications    metoprolol succinate 50 MG 24 hr tablet Wait to take this until your doctor or other care provider tells you to start again. Commonly known as: TOPROL-XL Take 1 tablet (50 mg total) by mouth daily.       TAKE these medications    albuterol (2.5 MG/3ML) 0.083% nebulizer solution Commonly known as: PROVENTIL TAKE 3 MLS (2.5 MG TOTAL) BY NEBULIZATION EVERY 6 (SIX) HOURS AS NEEDED FOR SHORTNESS OF BREATH.   Ventolin HFA 108 (90 Base) MCG/ACT inhaler Generic drug: albuterol Inhale 2 puffs into the lungs every 6 (six) hours as needed for wheezing or shortness of breath   allopurinol 100 MG tablet Commonly known as: ZYLOPRIM Take 1 tablet (100 mg total) by mouth daily.   amLODipine 10 MG tablet Commonly known as: NORVASC Take 1 tablet (10 mg total) by mouth daily.   budesonide-formoterol 160-4.5 MCG/ACT inhaler Commonly known as: Symbicort Inhale 2 puffs into the lungs 2 (two) times daily.   cyanocobalamin 500 MCG tablet Commonly known as: VITAMIN B12 Take 500 mcg by mouth daily.   ezetimibe 10 MG tablet Commonly known as: ZETIA Take 1 tablet  (10 mg total) by mouth daily.   ibuprofen 200 MG tablet Commonly known as: ADVIL Take 800 mg by mouth daily as needed (gout swelling).   predniSONE 20 MG tablet Commonly known as: DELTASONE Take 2 tablets (40 mg total) by mouth daily with breakfast for 2 days, THEN 1.5 tablets (30 mg total) daily with breakfast for 2 days, THEN 1 tablet (20 mg total) daily with breakfast for 2 days, THEN 0.5 tablets (10 mg total) daily with breakfast for 2 days. Start taking on: June 21, 2023   Spiriva HandiHaler 18 MCG inhalation capsule Generic drug: tiotropium PLACE 1 CAPSULE (18 MCG TOTAL) INTO INHALER AND INHALE DAILY.   vitamin C 1000 MG tablet Take 1,000 mg by mouth daily.   VITAMIN D PO Take 1 tablet by mouth daily.               Durable Medical Equipment  (From admission, onward)           Start     Ordered   06/17/23 0920  For home use only DME lightweight manual wheelchair with seat cushion  Once       Comments: Patient suffers from severe COPD which impairs their ability to perform daily activities like bathing, dressing, feeding, grooming, and toileting in the home.  A cane or walker will not resolve  issue with performing activities of daily living. A wheelchair will allow patient to safely perform daily activities. Patient  is not able to propel themselves in the home using a standard weight wheelchair due to endurance. Patient can self propel in the lightweight wheelchair. Length of need Lifetime. Accessories: elevating leg rests (ELRs), wheel locks, extensions and anti-tippers.   06/17/23 0920   06/17/23 0919  For home use only DME Walker rolling  Once       Question Answer Comment  Walker: With 5 Inch Wheels   Patient needs a walker to treat with the following condition COPD (chronic obstructive pulmonary disease) (HCC)      06/17/23 0920            DISPOSITION AND FOLLOW-UP:  [name] [name] was discharged from [facility] in Stable condition.  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  If possible it would benefit patient to simplify inhaler regiment to triple therapy if insurance allows.  Assess for O2 requirements given that these increased during admission and upon discharge.  Ensure patient has or will be following up with pulmonology.   Follow-up Appointments:  Follow-up Information     [facility] Follow up.   Specialty: Home Health Services Why: HHPT/OT arranged- they will contact you to schedule- initial visit anticipated for first of next week (3/17) Contact information: [address] [place] [address] [phone]         [facility] Follow up.   Why: NIV arrange, additional home concentrator, and higher flow regulators for home- to be delivered to home prior to discharge. Contact information: [address] [place] [address] [phone]         [facility] Follow up.   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  He is on 8 L O2 at baseline but was requiring upwards of 15 L high flow nasal cannula during admission.  He was treated with prednisone which will be continued as a taper on discharge in addition to breathing treatments.  PCCM followed during admission and given hypercapnia on ABG, started patient on BiPAP nightly.  Patient tolerated BiPAP well and  hypercapnia improved.  His SOB improved during admission but he never returned to baseline O2 requirement. Patient will be discharged with 2 concentrators, flow valve, and Oxymizer to allow for 10 L O2 use at baseline and 15 L on exertion.  He met with palliative during admission and is now DNR.  Outpatient palliative consult is placed.  He will follow-up with PCP and pulmonology in the coming weeks.   DISCHARGE INSTRUCTIONS:   Discharge Instructions     Diet - low sodium heart healthy   Complete by: As directed    Discharge instructions   Complete by: As directed    You were treated for a COPD exacerbation that was caused by a respiratory virus.   Discharge instructions   Complete by: As directed    You were treated for a COPD exacerbation  that was caused by a respiratory virus.  Unfortunately your lung disease has progressed and you have an increase in your supplemental oxygen requirements.  For this we have set up a BiPAP for you to use each night and have also set up extra oxygen tanks to utilize.  Please call and make an appointment with your primary care doctor as soon as possible regarding this admission.  The lung doctors will also be reaching out to schedule a follow-up appointment.  Please take all medications as prescribed including the steroids which we will taper upon discharge.   Increase activity slowly   Complete by: As directed    Increase activity slowly   Complete by: As directed        SUBJECTIVE:  SOB continues to improve. Comfortable with discharge Discharge Vitals:   BP 126/80 (BP Location: Right Arm)   Pulse 90   Temp 97.6 F (36.4 C) (Oral)   Resp (!) 25   Ht 5\' 6"  (1.676 m)   Wt 92.6 kg   SpO2 99%   BMI 32.95 kg/m   OBJECTIVE:  Constitutional: sitting up in bed, in no acute distress Cardiovascular: regular rate and rhythm, no m/r/g, no LEE Pulmonary/Chest: on 10 L [place] , decreased air movement bilaterally, no wheezing Skin: warm and dry Psych: normal  mood and behavior  Pertinent Labs, Studies, and Procedures:     Latest Ref Rng & Units 06/18/2023    3:18 AM 06/17/2023    4:39 AM 06/16/2023    3:27 AM  CBC  WBC 4.0 - 10.5 K/uL 14.4  15.9  15.0   Hemoglobin 13.0 - 17.0 g/dL 98.1  19.1  47.8   Hematocrit 39.0 - 52.0 % 40.8  36.5  37.2   Platelets 150 - 400 K/uL 299  268  261        Latest Ref Rng & Units 06/18/2023    3:18 AM 06/17/2023    4:39 AM 06/15/2023    3:24 AM  CMP  Glucose 70 - 99 mg/dL 295  621  96   BUN 8 - 23 mg/dL 10  11  14    Creatinine 0.61 - 1.24 mg/dL 3.08  6.57  8.46   Sodium 135 - 145 mmol/L 135  136  134   Potassium 3.5 - 5.1 mmol/L 4.3  3.7  4.1   Chloride 98 - 111 mmol/L 93  94  95   CO2 22 - 32 mmol/L 35  37  32   Calcium 8.9 - 10.3 mg/dL 9.2  8.9  8.8     ECHOCARDIOGRAM COMPLETE Result Date: 06/14/2023    ECHOCARDIOGRAM REPORT   Patient Name:   [name] [name] Date of Exam: 06/14/2023 Medical Rec #:  [id number]   Height:       66.0 in Accession #:    [id number]  Weight:       204.1 lb Date of Birth:  [birth year]   BSA:          2.017 m Patient Age:    62 years    BP:           143/93 mmHg Patient Gender: M           HR:           83 bpm. Exam Location:  Inpatient Procedure: 2D Echo, Cardiac Doppler and Color Doppler (Both Spectral and Color            Flow Doppler were utilized during procedure). Indications:  Dyspnea R06.00  History:        Patient has prior history of Echocardiogram examinations, most                 recent 05/11/2022. Pulmonary HTN, COPD and CKD, stage 2; Risk                 Factors:Hypertension, Dyslipidemia and Current Smoker.  Sonographer:    Lucendia Herrlich RCS Referring Phys: [phone] JULIE ANNE Mayford Knife  Sonographer Comments: Image acquisition challenging due to uncooperative patient. IMPRESSIONS  1. Poor acoustic windows limit study. Difficult to accurately assess regional wall motion as endocardium is difficult to see in all views.. Left ventricular ejection fraction, by estimation, is 55 to 60%. The  left ventricle has normal function.  2. Right ventricular systolic function is mildly reduced. The right ventricular size is mildly enlarged.  3. The mitral valve is normal in structure. Trivial mitral valve regurgitation.  4. Tricuspid valve regurgitation is moderate to severe.  5. The aortic valve is tricuspid. Aortic valve regurgitation is not visualized. Aortic valve sclerosis/calcification is present, without any evidence of aortic stenosis.  6. The inferior vena cava is dilated in size with <50% respiratory variability, suggesting right atrial pressure of 15 mmHg. FINDINGS  Left Ventricle: Poor acoustic windows limit study. Difficult to accurately assess regional wall motion as endocardium is difficult to see in all views. Left ventricular ejection fraction, by estimation, is 55 to 60%. The left ventricle has normal function. The left ventricular internal cavity size was normal in size. There is no left ventricular hypertrophy. Right Ventricle: The right ventricular size is mildly enlarged. Right vetricular wall thickness was not assessed. Right ventricular systolic function is mildly reduced. Left Atrium: Left atrial size was normal in size. Right Atrium: Right atrial size was normal in size. Pericardium: There is no evidence of pericardial effusion. Mitral Valve: The mitral valve is normal in structure. There is mild thickening of the mitral valve leaflet(s). Trivial mitral valve regurgitation. Tricuspid Valve: The tricuspid valve is normal in structure. Tricuspid valve regurgitation is moderate to severe. Aortic Valve: The aortic valve is tricuspid. Aortic valve regurgitation is not visualized. Aortic valve sclerosis/calcification is present, without any evidence of aortic stenosis. Pulmonic Valve: The pulmonic valve was not well visualized. Pulmonic valve regurgitation is not visualized. No evidence of pulmonic stenosis. Aorta: The aortic root and ascending aorta are structurally normal, with no evidence  of dilitation. Venous: The inferior vena cava is dilated in size with less than 50% respiratory variability, suggesting right atrial pressure of 15 mmHg. IAS/Shunts: No atrial level shunt detected by color flow Doppler.  LEFT VENTRICLE PLAX 2D LVIDd:         3.60 cm   Diastology LVIDs:         2.50 cm   LV e' medial:    13.20 cm/s LV PW:         1.00 cm   LV E/e' medial:  3.9 LV IVS:        0.90 cm   LV e' lateral:   11.80 cm/s LVOT diam:     2.30 cm   LV E/e' lateral: 4.4 LV SV:         66 LV SV Index:   33 LVOT Area:     4.15 cm  RIGHT VENTRICLE             IVC RV S prime:     13.30 cm/s  IVC diam: 2.30 cm TAPSE (M-mode):  1.8 cm LEFT ATRIUM             Index        RIGHT ATRIUM           Index LA diam:        3.10 cm 1.54 cm/m   RA Area:     17.60 cm LA Vol (A2C):   47.3 ml 23.45 ml/m  RA Volume:   53.35 ml  26.45 ml/m LA Vol (A4C):   50.0 ml 24.76 ml/m LA Biplane Vol: 52.4 ml 25.98 ml/m  AORTIC VALVE LVOT Vmax:   102.00 cm/s LVOT Vmean:  60.700 cm/s LVOT VTI:    0.159 m  AORTA Ao Root diam: 3.60 cm Ao Asc diam:  3.50 cm MITRAL VALVE               TRICUSPID VALVE MV Area (PHT): 3.99 cm    TR Peak grad:   46.8 mmHg MV Decel Time: 190 msec    TR Vmax:        342.00 cm/s MV E velocity: 51.80 cm/s MV A velocity: 87.20 cm/s  SHUNTS MV E/A ratio:  0.59        Systemic VTI:  0.16 m                            Systemic Diam: 2.30 cm Dietrich Pates MD Electronically signed by Dietrich Pates MD Signature Date/Time: 06/14/2023/3:44:34 PM    Final    DG Chest Port 1 View Result Date: 06/13/2023 CLINICAL DATA:  Shortness of breath. EXAM: PORTABLE CHEST 1 VIEW COMPARISON:  May 10, 2022. FINDINGS: The heart size and mediastinal contours are within normal limits. Emphysematous disease is noted bilaterally. Bibasilar opacities are noted concerning for subsegmental atelectasis or possibly scarring. The visualized skeletal structures are unremarkable. IMPRESSION: Emphysematous disease is again noted. Bibasilar subsegmental  atelectasis or scarring is again noted. Emphysema (ICD10-J43.9). Electronically Signed   By: Lupita Raider M.D.   On: 06/13/2023 15:01     Signed: Carmina Miller, DO  Internal Medicine Resident, PGY-1 [facility]  Pager: [phone] 7:46 PM, 06/20/2023

## 2023-06-20 NOTE — Plan of Care (Signed)
  Problem: Clinical Measurements: Goal: Will remain free from infection Outcome: Progressing   Problem: Health Behavior/Discharge Planning: Goal: Ability to manage health-related needs will improve Outcome: Progressing   Problem: Nutrition: Goal: Adequate nutrition will be maintained Outcome: Progressing   Problem: Pain Managment: Goal: General experience of comfort will improve and/or be controlled Outcome: Progressing   Problem: Elimination: Goal: Will not experience complications related to urinary retention Outcome: Progressing

## 2023-06-20 NOTE — Telephone Encounter (Signed)
 Patient Product/process development scientist completed.    The patient is insured through [facility].     Ran test claim for [employer] and Non Preferred  Ran test claim for Tre[employer] Ellipta and Non Preferred  This test claim was processed through [employer]- copay amounts may vary at other pharmacies due to [employer], or as the patient moves through the different stages of their insurance plan.     Roland Earl, CPHT Pharmacy Technician III Certified Patient Advocate [facility] Patient Advocate Team Direct Number: [phone]  Fax: [phone]

## 2023-06-20 NOTE — Telephone Encounter (Signed)
 Order has been placed to adapt.  Pt is aware and voiced his understanding.  Nothing further needed.

## 2023-06-20 NOTE — Progress Notes (Signed)
 Discharge instructions reviewed with pt.  Copy of instructions given to pt. [facility] has filled his script and will be picked up on the way out for discharge. Pt's DME has been delivered to the room --4 portable O2 tanks to go home with him. Pt has called his daughter to pick him up and will be here shortly.  Pt will be d/c'd via wheelchair with belongings, on O2, and will be escorted by staff.   Selma Mink,RN SWOT

## 2023-06-20 NOTE — Progress Notes (Signed)
 NAME:  [name] [name], MRN:  [id number], DOB:  1960-07-20, LOS: 7 ADMISSION DATE:  06/13/2023, CONSULTATION DATE:  06/17/23 REFERRING MD:  Rudene Christians, DO CHIEF COMPLAINT:  End stage COPD   History of Present Illness:  63 year old male with COPD on 6-8L O2 at home, CKD IIIA, HTN, gout admitted to IMTS on 3/5 for acute hypoxemic respiratory failure secondary to coronavirus after presenting with 1 week of worsening shortness of breath and desaturations on 10L O2. PCCM consulted for high O2 requirements.  While in the hospital at rest, he is able to tolerate ~9L O2 via [place] however with ambulation he requires up to 15L O2 associated with dyspnea with short distances. Able to speak at rest with no desaturations but does still have shortness of breath with conversation. He is frustrated at his condition and states he has never considered future steps if his COPD were to worsen. He is currently followed by Dr. Isaiah Serge in clinic for COPD and on Symbicort and Spiriva and chronic O2 use ~6L. Prior PFTs with FEV1 37% in 2017 and DLCO 38%. Last COPD exacerbation includes hospitalization in Feb 2024 secondary to COVID pna.  Pertinent  Medical History   COPD on 6-8L O2 at home, CKD IIIA, HTN, gout  Significant Hospital Events: Including procedures, antibiotic start and stop dates in addition to other pertinent events     Interim History / Subjective:   Feels that BiPAP is helping with his breathing Remains on 10 L oxygen  Objective   Blood pressure 126/80, pulse 90, temperature 97.6 F (36.4 C), temperature source Oral, resp. rate (!) 25, height 5\' 6"  (1.676 m), weight 92.6 kg, SpO2 99%.    FiO2 (%):  [50 %-60 %] 50 %   Intake/Output Summary (Last 24 hours) at 06/20/2023 1452 Last data filed at 06/20/2023 1200 Gross per 24 hour  Intake 920 ml  Output 3450 ml  Net -2530 ml   Filed Weights   06/13/23 1156  Weight: 92.6 kg   Physical Exam: Gen:      No acute distress HEENT:  EOMI, sclera  anicteric Neck:     No masses; no thyromegaly Lungs:    Clear to auscultation bilaterally; normal respiratory effort CV:         Regular rate and rhythm; no murmurs Abd:      + bowel sounds; soft, non-tender; no palpable masses, no distension Ext:    No edema; adequate peripheral perfusion Skin:      Warm and dry; no rash Neuro: alert and oriented x 3 Psych: normal mood and affect   Lab/imaging review No new labs or imaging today   Resolved Hospital Problem list   N/A  Assessment & Plan:  Acute on chronic hypoxemic respiratory failure in setting of coronovirus OC43 Severe COPD in exacerbation 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 Will extend steroid course. His PH is likely contributing however limited options as this is likely secondary to his chronic lung disease. If qualifies he would benefit from NIV therapy to help with oxygen requirement so will check for chronic hypercarbia. Adding ohtuvayre would also be a reasonable option for him (PDE3 and PDE4 blocker) however unable to obtain inpatient.  -- Continue prednisone at 60 mg daily.  Can start taper by 10 mg every 2 days -- Brovana, Pulmicort, scheduled DuoNebs -- Pulmonary hygiene, incentive spirometer, mobilize - Appreciate help from palliative care.  He  is currently DNR and will go home with outpatient palliative care - Once the home NIV and concentrators for increased oxygen demand is set up he can go home and will make follow-up in clinic  Best Practice (right click and "Reselect all SmartList Selections" daily)   Per primary  Critical care time: n/a   Chilton Greathouse MD  Pulmonary & Critical care See Amion for pager  If no response to pager , please call [phone] until 7pm After 7:00 pm call Elink  [phone] 06/20/2023, 2:52 PM

## 2023-06-20 NOTE — Progress Notes (Signed)
 Physical Therapy Treatment Patient Details Name: [name] [name] MRN: [id number] DOB: 1960-06-16 Today's Date: 06/20/2023   History of Present Illness Pt is a 63 yo male presenting to [facility] ED on 06/13/23 via EMS for acute on chronic hypoxic respiratory failure. PMH of COPD, CKD III, HTN, gout.    PT Comments  Pt seated up EOB on arrival and agreeable to session. Pt continues to be limited by decreased cardiopulmonary endurance with session focused on self monitoring of SpO2 reading on monitor during activity and rest to better self assess need to increase O2 post acutely. Pt able to maintain SpO2 >88% on 10L during activity however continues to desat during rest break. Pt needing increased time in standing to recover from 86% to 90% before seconds gait bout and increase to 15L for ~30seconds to recover >95% at end of session. Pt able to be titrated back to 10L and maintain sats >95% on 10L seated up EOB at end of session. Pt demonstrating mobility with rollator support at grossly supervision level for safety. Pt continues to benefit from skilled PT services to progress toward functional mobility goals.      If plan is discharge home, recommend the following: Help with stairs or ramp for entrance;Assist for transportation   Can travel by private vehicle        Equipment Recommendations  Rollator (4 wheels);BSC/3in1;Wheelchair (measurements PT);Wheelchair cushion (measurements PT)    Recommendations for Other Services       Precautions / Restrictions Precautions Precautions: Fall Recall of Precautions/Restrictions: Intact Precaution/Restrictions Comments: watch 02 Restrictions Weight Bearing Restrictions Per Provider Order: No     Mobility  Bed Mobility Overal bed mobility: Needs Assistance             General bed mobility comments: seated up EOB    Transfers Overall transfer level: Modified independent Equipment used: Rollator (4 wheels)               General transfer  comment: no assist needed. good hand placement    Ambulation/Gait Ambulation/Gait assistance: Supervision Gait Distance (Feet): 55 Feet (x2) Assistive device: Rollator (4 wheels) Gait Pattern/deviations: Step-through pattern, Trunk flexed Gait velocity: decr     General Gait Details: slow but steady with rollator support, improved posture vs RW, pt able to maintain SpO2 >88% on 10L during gait, dropping to 86% with standing rest with pt able to improve with upright posture and cues for PLB   Stairs             Wheelchair Mobility     Tilt Bed    Modified Rankin (Stroke Patients Only)       Balance Overall balance assessment: Needs assistance Sitting-balance support: Bilateral upper extremity supported, Feet supported Sitting balance-Leahy Scale: Good Sitting balance - Comments: EOB   Standing balance support: Single extremity supported, Bilateral upper extremity supported, During functional activity Standing balance-Leahy Scale: Fair Standing balance comment: stood with raised bed for support                            Communication Communication Communication: No apparent difficulties  Cognition Arousal: Alert Behavior During Therapy: WFL for tasks assessed/performed   PT - Cognitive impairments: No apparent impairments                         Following commands: Intact      Cueing Cueing Techniques: Verbal cues  Exercises  General Comments General comments (skin integrity, edema, etc.): SpO2 >88% on 10L during activity, dropping to 86% at rest pt able to improve, after second bout of gait pt needing O2 increased to 15L at rest to improve sats >90% with improvement to 100% in <30seconds, titrated back to 10L with SpO2 95% at end of session with pt seated up EOB      Pertinent Vitals/Pain Pain Assessment Pain Assessment: No/denies pain    Home Living                          Prior Function            PT  Goals (current goals can now be found in the care plan section) Acute Rehab PT Goals Patient Stated Goal: to be able to walk further PT Goal Formulation: With patient Time For Goal Achievement: 06/29/23 Progress towards PT goals: Progressing toward goals    Frequency    Min 3X/week      PT Plan      Co-evaluation              AM-PAC PT "6 Clicks" Mobility   Outcome Measure  Help needed turning from your back to your side while in a flat bed without using bedrails?: None Help needed moving from lying on your back to sitting on the side of a flat bed without using bedrails?: None Help needed moving to and from a bed to a chair (including a wheelchair)?: None Help needed standing up from a chair using your arms (e.g., wheelchair or bedside chair)?: None Help needed to walk in hospital room?: None Help needed climbing 3-5 steps with a railing? : A Little 6 Click Score: 23    End of Session Equipment Utilized During Treatment: Oxygen Activity Tolerance: Patient tolerated treatment well Patient left: in bed;with call bell/phone within reach;Other (comment) (seated EOB) Nurse Communication: Mobility status PT Visit Diagnosis: Other abnormalities of gait and mobility (R26.89)     Time: 1610-9604 PT Time Calculation (min) (ACUTE ONLY): 20 min  Charges:    $Therapeutic Exercise: 8-22 mins PT General Charges $$ ACUTE PT VISIT: 1 Visit                     Lindell Tussey R. PTA Acute Rehabilitation Services Office: [phone]   Catalina Antigua 06/20/2023, 12:00 PM

## 2023-06-20 NOTE — TOC Transition Note (Addendum)
 Transition of Care (TOC) - Discharge Note Donn Pierini RN, BSN Transitions of Care Unit 4E- RN Case Manager See Treatment Team for direct phone #   Patient Details  Name: [name] [name] MRN: [id number] Date of Birth: [birth year]  Transition of Care [facility]) CM/SW Contact:  Darrold Span, RN Phone Number: 06/20/2023, 4:32 PM   Clinical Narrative:    NIV signed order faxed to [employer] this am for insurance approval.   Pt stable for transition home today.  CM has confirmed with [employer] liaison that NIV for home has been approved and is set to deliver this afternoon to the home prior to discharge.  Dr. Isaiah Serge office has also sent over the order for the additional concentrator to meet pt's higher liter flow- [employer] will also deliver that to the home w/ NIV as well as the wheelchair.   Pt will also need higher liter regulator (15L) and tanks to transport home with- [employer] has delivered these to the room for pt to use on transport home.  Daughter to transport home.   Cm noted referral for outpt PC needs- Cm spoke with pt at bedside- choice offered for provider- pt voiced he did not have a preference- call made to [facility] liaison for outpt Palliative referral- they will f/u with pt post discharge.   HH set up with [facility] with start of care for next week.   TOC needs have been completed and pt updated.    Final next level of care: Home w Home Health Services Barriers to Discharge: Barriers Resolved   Patient Goals and CMS Choice Patient states their goals for this hospitalization and ongoing recovery are:: return home CMS Medicare.gov Compare Post Acute Care list provided to:: Patient Choice offered to / list presented to : Patient      Discharge Placement               Home w/ [facility]        Discharge Plan and Services Additional resources added to the After Visit Summary for     Discharge Planning Services: CM Consult Post Acute Care Choice: Home Health, Durable Medical  Equipment          DME Arranged: NIV, Wheelchair manual DME Agency: [facility] Date DME Agency Contacted: 06/19/23 Time DME Agency Contacted: 1130 Representative spoke with at DME Agency: [facility] HH Arranged: PT, OT HH Agency: [facility] Date [facility] Agency Contacted: 06/19/23 Time HH Agency Contacted: 1445 Representative spoke with at [facility] Agency: Clifton Custard  Social Drivers of Health (SDOH) Interventions SDOH Screenings   Food Insecurity: No Food Insecurity (06/13/2023)  Housing: Low Risk  (06/13/2023)  Transportation Needs: Unmet Transportation Needs (06/13/2023)  Utilities: Not At Risk (06/13/2023)  Depression (PHQ2-9): Low Risk  (05/25/2022)  Tobacco Use: Medium Risk (06/13/2023)     Readmission Risk Interventions    06/20/2023    4:32 PM  Readmission Risk Prevention Plan  Post Dischage Appt Complete  Medication Screening Complete  Transportation Screening Complete

## 2023-06-21 ENCOUNTER — Telehealth: Payer: Self-pay

## 2023-06-21 ENCOUNTER — Other Ambulatory Visit: Payer: Self-pay | Admitting: Family Medicine

## 2023-06-21 ENCOUNTER — Encounter: Payer: Self-pay | Admitting: Nurse Practitioner

## 2023-06-21 ENCOUNTER — Other Ambulatory Visit: Payer: Self-pay

## 2023-06-21 DIAGNOSIS — J449 Chronic obstructive pulmonary disease, unspecified: Secondary | ICD-10-CM

## 2023-06-21 NOTE — Telephone Encounter (Signed)
 Sent postal letter to PT.

## 2023-06-21 NOTE — Telephone Encounter (Signed)
 Copied from CRM [id number]. Topic: Referral - Status >> Jun 21, 2023  3:59 PM Alphonzo Lemmings O wrote: Reason for CRM: [name] [name] is calling cause  dr Alvis Lemmings they want to let know we have received palliative referral  and [facility] will follow patient [phone] for any question

## 2023-06-22 ENCOUNTER — Other Ambulatory Visit: Payer: Self-pay

## 2023-06-22 MED ORDER — TIOTROPIUM BROMIDE MONOHYDRATE 18 MCG IN CAPS
1.0000 | ORAL_CAPSULE | Freq: Every day | RESPIRATORY_TRACT | 0 refills | Status: DC
Start: 2023-06-22 — End: 2023-07-11
  Filled 2023-06-22: qty 30, 30d supply, fill #0

## 2023-06-22 NOTE — Telephone Encounter (Signed)
 Requested medication (s) are due for refill today: yes  Requested medication (s) are on the active medication list: yes  Last refill:  05/22/23  Future visit scheduled: no  Notes to clinic:  Unable to refill per protocol, courtesy refill already given, routing for provider approval.      Requested Prescriptions  Pending Prescriptions Disp Refills   tiotropium (SPIRIVA HANDIHALER) 18 MCG inhalation capsule 30 capsule 0    Sig: PLACE 1 CAPSULE (18 MCG TOTAL) INTO INHALER AND INHALE DAILY.     Pulmonology:  Anticholinergic Agents Failed - 06/22/2023 12:40 PM      Failed - Valid encounter within last 12 months    Recent Outpatient Visits           1 year ago Hospital discharge follow-up   [place] [facility] Grayce Sessions, NP   1 year ago Screening for diabetes mellitus   [place] [facility] - [facility] Dept Of [place]. [facility] Hoy Register, MD   2 years ago Chronic respiratory failure with hypoxia [facility])   [place] [facility] - [facility] Dept Of [place]. [facility] Hoy Register, MD   2 years ago Essential hypertension   [place] [facility], Gildardo Pounds, NP   3 years ago Influenza vaccine refused   [place] [facility] Dept Of [place]. [facility] Hoy Register, MD

## 2023-06-22 NOTE — Telephone Encounter (Signed)
 I will be on the look out for the referral as I have not seen it yet.

## 2023-06-22 NOTE — Telephone Encounter (Signed)
 Requested medications are due for refill today.  yes  Requested medications are on the active medications list.  yes  Last refill. 05/27/2022 18 2 rf  Future visit scheduled.   no  Notes to clinic.  Please review for refill.    Requested Prescriptions  Pending Prescriptions Disp Refills   albuterol (VENTOLIN HFA) 108 (90 Base) MCG/ACT inhaler 18 g 2    Sig: Inhale 2 puffs into the lungs every 6 (six) hours as needed for wheezing or shortness of breath     Pulmonology:  Beta Agonists 2 Failed - 06/22/2023 12:44 PM      Failed - Valid encounter within last 12 months    Recent Outpatient Visits           1 year ago Hospital discharge follow-up   [place] [facility] Grayce Sessions, NP   1 year ago Screening for diabetes mellitus   [place] [facility] - [facility] Dept Of [place]. [facility] Hoy Register, MD   2 years ago Chronic respiratory failure with hypoxia [facility])   [place] [facility] - [facility] Dept Of [place]. [facility] Hoy Register, MD   2 years ago Essential hypertension   [place] [facility], Gildardo Pounds, NP   3 years ago Influenza vaccine refused   [place] [facility] [place] - [facility] Dept Of [place]. [facility], Odette Horns, MD              Passed - Last BP in normal range    BP Readings from Last 1 Encounters:  06/20/23 126/80         Passed - Last Heart Rate in normal range    Pulse Readings from Last 1 Encounters:  06/20/23 90

## 2023-06-22 NOTE — Telephone Encounter (Signed)
 LM for PT to call us.

## 2023-06-25 ENCOUNTER — Other Ambulatory Visit: Payer: Self-pay

## 2023-06-25 ENCOUNTER — Telehealth: Payer: Self-pay

## 2023-06-25 NOTE — Transitions of Care (Post Inpatient/ED Visit) (Signed)
   06/25/2023  Name: [name] [name] MRN: [id number] DOB: 04/11/1960  Today's TOC FU Call Status: Today's TOC FU Call Status:: Successful TOC FU Call Completed TOC FU Call Complete Date: 06/25/23 Patient's Name and Date of Birth confirmed.  Transition Care Management Follow-up Telephone Call Date of Discharge: 06/20/23 Discharge Facility: [facility]) Type of Discharge: Inpatient Admission Primary Inpatient Discharge Diagnosis:: acute on chronic hypoxic respiratory failure How have you been since you were released from the hospital?: Better Any questions or concerns?: No  Items Reviewed: Did you receive and understand the discharge instructions provided?: Yes Medications obtained,verified, and reconciled?: Partial Review Completed Reason for Partial Mediation Review: he stated he has all of his medications as well as a nebulizer and he did not have any questions about the med regime and did not need to review the med list. Any new allergies since your discharge?: No Dietary orders reviewed?: Yes Type of Diet Ordered:: heart healthy, low sodium Do you have support at home?: Yes People in Home: child(ren), adult Name of Support/Comfort Primary Source: He lives with his daughter  Medications Reviewed Today: Medications Reviewed Today   Medications were not reviewed in this encounter     Home Care and Equipment/Supplies: Were Home Health Services Ordered?: Yes Name of Home Health Agency:: [facility] Has Agency set up a time to come to your home?: No (he said they called him once and will be calling back today or tomorrow to schedule the home visit) Any new equipment or medical supplies ordered?: Yes Name of Medical supply agency?: [facility]-BiPAP, wheelchair ,extra O2 concentrator Were you able to get the equipment/medical supplies?: Yes Do you have any questions related to the use of the equipment/supplies?: No  Functional Questionnaire: Do you need assistance with  bathing/showering or dressing?: No Do you need assistance with meal preparation?: Yes (His daughter prepares his meals) Do you need assistance with eating?: No Do you have difficulty maintaining continence: No Do you need assistance with getting out of bed/getting out of a chair/moving?: Yes (He has a wheelchair that he says he uses as a walker.  SHe also uses O2 at 10L until he becomes short of breath and he then turns is up to 15L.  He noted that it is seldom that he increases to 15L . He uses the BiPAP at nightl) Do you have difficulty managing or taking your medications?: No  Follow up appointments reviewed: PCP Follow-up appointment confirmed?: Yes Date of PCP follow-up appointment?: 07/11/23 Follow-up Provider: Corene Cornea, PA Specialist Hospital Follow-up appointment confirmed?: Yes Date of Specialist follow-up appointment?: 08/03/23 Follow-Up Specialty Provider:: pulmonary Do you need transportation to your follow-up appointment?: No Do you understand care options if your condition(s) worsen?: Yes-patient verbalized understanding    SIGNATURE Robyne Peers, RN

## 2023-06-26 DIAGNOSIS — J449 Chronic obstructive pulmonary disease, unspecified: Secondary | ICD-10-CM | POA: Diagnosis not present

## 2023-06-26 DIAGNOSIS — U071 COVID-19: Secondary | ICD-10-CM | POA: Diagnosis not present

## 2023-06-27 ENCOUNTER — Telehealth: Payer: Self-pay | Admitting: *Deleted

## 2023-06-27 NOTE — Telephone Encounter (Signed)
 Copied from CRM [id number]. Topic: General - Other >> Jun 27, 2023  1:12 PM Ja-Kwan M wrote: Reason for CRM: Ron Parker with [facility] requests Rx for a rollator. Call back# [phone]

## 2023-06-28 NOTE — Telephone Encounter (Signed)
 Patient has upcoming appointment in April to discuss concerns for walker.

## 2023-07-02 ENCOUNTER — Telehealth: Payer: Self-pay

## 2023-07-02 NOTE — Telephone Encounter (Signed)
 Call to  Jeneen Rinks PT at [facility] . To Advise unable to give [facility] orders patient needs to be seen by provider. Unable to reach VM left.

## 2023-07-02 NOTE — Telephone Encounter (Signed)
 Copied from CRM [id number]. Topic: Clinical - Home Health Verbal Orders >> Jul 02, 2023 10:44 AM Franchot Heidelberg wrote: Caller/Agency: Jeneen Rinks PT [facility]  Callback Number: [phone] Secure VM  Service Requested: PT  Frequency:   1w3  Any new concerns about the patient? No

## 2023-07-06 DIAGNOSIS — U071 COVID-19: Secondary | ICD-10-CM | POA: Diagnosis not present

## 2023-07-06 DIAGNOSIS — J441 Chronic obstructive pulmonary disease with (acute) exacerbation: Secondary | ICD-10-CM | POA: Diagnosis not present

## 2023-07-06 DIAGNOSIS — M109 Gout, unspecified: Secondary | ICD-10-CM | POA: Diagnosis not present

## 2023-07-06 DIAGNOSIS — J4489 Other specified chronic obstructive pulmonary disease: Secondary | ICD-10-CM | POA: Diagnosis not present

## 2023-07-06 DIAGNOSIS — I272 Pulmonary hypertension, unspecified: Secondary | ICD-10-CM | POA: Diagnosis not present

## 2023-07-06 DIAGNOSIS — E785 Hyperlipidemia, unspecified: Secondary | ICD-10-CM | POA: Diagnosis not present

## 2023-07-06 DIAGNOSIS — J9621 Acute and chronic respiratory failure with hypoxia: Secondary | ICD-10-CM | POA: Diagnosis not present

## 2023-07-06 DIAGNOSIS — Z7951 Long term (current) use of inhaled steroids: Secondary | ICD-10-CM | POA: Diagnosis not present

## 2023-07-06 DIAGNOSIS — J988 Other specified respiratory disorders: Secondary | ICD-10-CM | POA: Diagnosis not present

## 2023-07-06 DIAGNOSIS — N182 Chronic kidney disease, stage 2 (mild): Secondary | ICD-10-CM | POA: Diagnosis not present

## 2023-07-06 DIAGNOSIS — I129 Hypertensive chronic kidney disease with stage 1 through stage 4 chronic kidney disease, or unspecified chronic kidney disease: Secondary | ICD-10-CM | POA: Diagnosis not present

## 2023-07-10 DIAGNOSIS — J449 Chronic obstructive pulmonary disease, unspecified: Secondary | ICD-10-CM | POA: Diagnosis not present

## 2023-07-10 DIAGNOSIS — U071 COVID-19: Secondary | ICD-10-CM | POA: Diagnosis not present

## 2023-07-11 ENCOUNTER — Encounter: Payer: Self-pay | Admitting: Physician Assistant

## 2023-07-11 ENCOUNTER — Ambulatory Visit: Attending: Physician Assistant | Admitting: Physician Assistant

## 2023-07-11 ENCOUNTER — Other Ambulatory Visit: Payer: Self-pay

## 2023-07-11 VITALS — BP 136/81 | HR 90 | Ht 66.0 in | Wt 186.0 lb

## 2023-07-11 DIAGNOSIS — E7800 Pure hypercholesterolemia, unspecified: Secondary | ICD-10-CM | POA: Diagnosis not present

## 2023-07-11 DIAGNOSIS — I10 Essential (primary) hypertension: Secondary | ICD-10-CM

## 2023-07-11 DIAGNOSIS — J449 Chronic obstructive pulmonary disease, unspecified: Secondary | ICD-10-CM | POA: Diagnosis not present

## 2023-07-11 DIAGNOSIS — Z09 Encounter for follow-up examination after completed treatment for conditions other than malignant neoplasm: Secondary | ICD-10-CM

## 2023-07-11 DIAGNOSIS — M1A9XX0 Chronic gout, unspecified, without tophus (tophi): Secondary | ICD-10-CM | POA: Diagnosis not present

## 2023-07-11 MED ORDER — ALLOPURINOL 100 MG PO TABS
100.0000 mg | ORAL_TABLET | Freq: Every day | ORAL | 0 refills | Status: DC
  Filled 2023-07-11 – 2023-08-15 (×2): qty 90, 90d supply, fill #0

## 2023-07-11 MED ORDER — EZETIMIBE 10 MG PO TABS
10.0000 mg | ORAL_TABLET | Freq: Every day | ORAL | 0 refills | Status: DC
  Filled 2023-07-11 – 2023-08-15 (×2): qty 90, 90d supply, fill #0

## 2023-07-11 MED ORDER — ALBUTEROL SULFATE HFA 108 (90 BASE) MCG/ACT IN AERS
2.0000 | INHALATION_SPRAY | Freq: Four times a day (QID) | RESPIRATORY_TRACT | 2 refills | Status: DC | PRN
  Filled 2023-07-11 – 2023-08-22 (×2): qty 18, 25d supply, fill #0
  Filled 2023-09-18: qty 18, 25d supply, fill #1
  Filled 2023-11-12 (×2): qty 18, 25d supply, fill #2

## 2023-07-11 MED ORDER — AMLODIPINE BESYLATE 10 MG PO TABS
10.0000 mg | ORAL_TABLET | Freq: Every day | ORAL | 0 refills | Status: DC
  Filled 2023-07-11 – 2023-08-15 (×2): qty 90, 90d supply, fill #0

## 2023-07-11 MED ORDER — ALBUTEROL SULFATE (2.5 MG/3ML) 0.083% IN NEBU
INHALATION_SOLUTION | RESPIRATORY_TRACT | 0 refills | Status: DC
  Filled 2023-07-11: qty 360, 30d supply, fill #0

## 2023-07-11 MED ORDER — TIOTROPIUM BROMIDE MONOHYDRATE 18 MCG IN CAPS
1.0000 | ORAL_CAPSULE | Freq: Every day | RESPIRATORY_TRACT | 1 refills | Status: DC
  Filled 2023-07-11 – 2023-07-20 (×2): qty 30, 30d supply, fill #0
  Filled 2023-08-22: qty 30, 30d supply, fill #1

## 2023-07-11 NOTE — Progress Notes (Signed)
 Patient ID: [name] [name], male   DOB: 03-02-1961, 63 y.o.   MRN: [id number]    [name] [name], is a 63 y.o. male  WGN:[id number]  HQI:[id number]  DOB - 1961-02-21  Chief Complaint  Patient presents with   Hospitalization Follow-up    Hospitalization f/u. Med refill.  Requesting gabapentin        Subjective:   [name] [name] is a 63 y.o. male here today for a follow up visit Primary Problem: Acute on chronic hypoxic respiratory failure (HCC) from 3/5 to 3/12.  His daughter is here with him today.  He is living with her.  He has been referred to palliative care.  He was able to get 02 tanks for home.  He does have f/up scheduled with Pulmonology.  He is able to walk to and from bathroom at home and get up and down for short distances on Home O2.  Appetite is at baseline.     Hospital Problems: Principal Problem:   Acute on chronic hypoxic respiratory failure (HCC) Active Problems:   Gout   COPD exacerbation (HCC)   Hypertension   Moderate to severe pulmonary hypertension (HCC)   Viral respiratory infection  [name] was discharged from [facility] in Stable condition. At the hospital follow up visit please address:   Acute on chronic hypoxic respiratory failure 2/2 Coronavirus COPD exacerbation Probable PH, secondary WHO group II, III  Ensure patient was compliant with prednisone taper in addition to at home inhalers.  If possible it would benefit patient to simplify inhaler regiment to triple therapy if insurance allows.  Assess for O2 requirements given that these increased during admission and upon discharge.  Ensure patient has or will be following up with pulmonology.     Follow-up Appointments:   Follow-up Information       [facility] Follow up.   Specialty: Home Health Services Why: HHPT/OT arranged- they will contact you to schedule- initial visit anticipated for first of next week (3/17) Contact information: [address] [place] [address] [phone]              [facility] Follow up.   Why: NIV arrange, additional home concentrator, and higher flow regulators for home- to be delivered to home prior to discharge. Contact information: [address] [place] [address] [phone]              [facility] Follow up.   Why: referral made for outpt Palliative- they will contact you post discharge Contact information: [address] [facility] [place] [address] [phone]     No problems updated.  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 Authorizing Provider  Ascorbic Acid (VITAMIN C) 1000 MG tablet Take 1,000 mg by mouth daily.   Yes [provider]  budesonide-formoterol (SYMBICORT) 160-4.5 MCG/ACT inhaler Inhale 2 puffs into the  lungs 2 (two) times daily. 06/12/23  Yes Mannam, Praveen, MD  ibuprofen (ADVIL) 200 MG tablet Take 800 mg by mouth daily as needed (gout swelling).   Yes [provider]  vitamin B-12 (CYANOCOBALAMIN) 500 MCG tablet Take 500 mcg by mouth daily.   Yes [provider]  VITAMIN D PO Take 1 tablet by mouth daily.   Yes [provider]  albuterol (PROVENTIL) (2.5 MG/3ML) 0.083% nebulizer solution TAKE 3 MLS (2.5 MG TOTAL) BY NEBULIZATION EVERY 6 (SIX) HOURS AS NEEDED FOR SHORTNESS OF BREATH. 07/11/23   Anders Simmonds, PA-C  albuterol (VENTOLIN  HFA) 108 (90 Base) MCG/ACT inhaler Inhale 2 puffs into the lungs every 6 (six) hours as needed for wheezing or shortness of breath 07/11/23   Anders Simmonds, PA-C  allopurinol (ZYLOPRIM) 100 MG tablet Take 1 tablet (100 mg total) by mouth daily. 07/11/23   Anders Simmonds, PA-C  amLODipine (NORVASC) 10 MG tablet Take 1 tablet (10 mg total) by mouth daily. 07/11/23   Anders Simmonds, PA-C  ezetimibe (ZETIA) 10 MG tablet Take 1 tablet (10 mg total) by mouth daily. 07/11/23   Anders Simmonds, PA-C  metoprolol succinate (TOPROL-XL) 50 MG 24 hr tablet Take 1 tablet (50 mg total) by mouth daily. Patient not taking: Reported on 07/11/2023 05/23/23   Hoy Register, MD  tiotropium (SPIRIVA HANDIHALER) 18 MCG inhalation capsule PLACE 1 CAPSULE (18 MCG TOTAL) INTO INHALER AND INHALE DAILY. 07/11/23   Anders Simmonds, PA-C  ALBUTEROL IN Inhale into the lungs.  07/03/11  [provider]    ROS: Neg HEENT Neg cardiac Neg GI Neg GU Neg MS Neg psych Neg neuro  Objective:   Vitals:   07/11/23 1400  BP: 133/81  Pulse: 90  SpO2: 97%  Weight: 186 lb (84.4 kg)  Height: 5\' 6"  (1.676 m)  On 10L home O2(used ours in office) Exam General appearance : Awake, alert, not in any distress. Appears in poor health overallSpeech Clear. Not toxic looking HEENT: Atraumatic and Normocephalic, pupils equally reactive to light and accomodation Neck: Supple, no JVD. No cervical lymphadenopathy.  Chest: fair air entry bilaterally, CTAB.  No rales/rhonchi/wheezing CVS: S1 S2 regular, no murmurs.  Extremities: B/L Lower Ext shows no edema, both legs are warm to touch Neurology: Awake alert, and oriented X 3, CN II-XII intact, Non focal Skin: No Rash  Data Review Lab Results  Component Value Date   HGBA1C 5.0 03/14/2022   HGBA1C 5.2 10/26/2014    Assessment & Plan   1. COPD without exacerbation (HCC) - albuterol (PROVENTIL) (2.5 MG/3ML) 0.083% nebulizer solution; TAKE 3 MLS (2.5 MG TOTAL) BY  NEBULIZATION EVERY 6 (SIX) HOURS AS NEEDED FOR SHORTNESS OF BREATH.  Dispense: 360 mL; Refill: 0 - tiotropium (SPIRIVA HANDIHALER) 18 MCG inhalation capsule; PLACE 1 CAPSULE (18 MCG TOTAL) INTO INHALER AND INHALE DAILY.  Dispense: 30 capsule; Refill: 1  2. Chronic gout without tophus, unspecified cause, unspecified site - allopurinol (ZYLOPRIM) 100 MG tablet; Take 1 tablet (100 mg total) by mouth daily.  Dispense: 90 tablet; Refill: 0  3. Essential hypertension Controlled.  Continue to hold metoprolol at current levels.   - amLODipine (NORVASC) 10 MG tablet; Take 1 tablet (10 mg total) by mouth daily.  Dispense: 90 tablet; Refill: 0  4. Pure hypercholesterolemia - ezetimibe (ZETIA) 10 MG tablet; Take 1 tablet (10 mg total) by mouth daily.  Dispense: 90 tablet; Refill: 0  5. Hospital discharge follow-up (Primary) Has palliative care  and pulmonology follow up.      Return in about 3 months (around 10/10/2023) for PCP for chronic conditions.  The patient was given clear instructions to go to ER or return to medical center if symptoms don't improve, worsen or new problems develop. The patient verbalized understanding. The patient was told to call to get lab results if they haven't heard anything in the next week.      Georgian Co, PA-C [facility] [place], [place] [phone]   07/11/2023, 2:23 PM

## 2023-07-17 ENCOUNTER — Other Ambulatory Visit: Payer: Self-pay

## 2023-07-20 ENCOUNTER — Other Ambulatory Visit: Payer: Self-pay

## 2023-07-20 DIAGNOSIS — U071 COVID-19: Secondary | ICD-10-CM | POA: Diagnosis not present

## 2023-07-20 DIAGNOSIS — J449 Chronic obstructive pulmonary disease, unspecified: Secondary | ICD-10-CM | POA: Diagnosis not present

## 2023-07-21 DIAGNOSIS — J449 Chronic obstructive pulmonary disease, unspecified: Secondary | ICD-10-CM | POA: Diagnosis not present

## 2023-07-21 DIAGNOSIS — J9610 Chronic respiratory failure, unspecified whether with hypoxia or hypercapnia: Secondary | ICD-10-CM | POA: Diagnosis not present

## 2023-07-23 ENCOUNTER — Other Ambulatory Visit: Payer: Self-pay

## 2023-07-26 ENCOUNTER — Telehealth: Payer: Self-pay

## 2023-07-26 NOTE — Telephone Encounter (Signed)
 Signed home health orders efaxed to [facility].

## 2023-07-31 ENCOUNTER — Other Ambulatory Visit: Payer: Self-pay

## 2023-07-31 ENCOUNTER — Inpatient Hospital Stay (HOSPITAL_COMMUNITY)

## 2023-07-31 ENCOUNTER — Emergency Department (HOSPITAL_COMMUNITY)

## 2023-07-31 ENCOUNTER — Encounter (HOSPITAL_COMMUNITY): Payer: Self-pay | Admitting: Emergency Medicine

## 2023-07-31 ENCOUNTER — Ambulatory Visit: Payer: Self-pay | Admitting: Primary Care

## 2023-07-31 ENCOUNTER — Inpatient Hospital Stay (HOSPITAL_COMMUNITY)
Admission: EM | Admit: 2023-07-31 | Discharge: 2023-08-07 | DRG: 193 | Disposition: A | Discharge status: Home Health | Attending: Internal Medicine | Admitting: Internal Medicine

## 2023-07-31 DIAGNOSIS — Z9981 Dependence on supplemental oxygen: Secondary | ICD-10-CM

## 2023-07-31 DIAGNOSIS — Z91014 Allergy to mammalian meats: Secondary | ICD-10-CM | POA: Diagnosis not present

## 2023-07-31 DIAGNOSIS — Z66 Do not resuscitate: Secondary | ICD-10-CM | POA: Diagnosis not present

## 2023-07-31 DIAGNOSIS — J439 Emphysema, unspecified: Secondary | ICD-10-CM | POA: Diagnosis present

## 2023-07-31 DIAGNOSIS — J449 Chronic obstructive pulmonary disease, unspecified: Secondary | ICD-10-CM | POA: Diagnosis not present

## 2023-07-31 DIAGNOSIS — R0902 Hypoxemia: Secondary | ICD-10-CM | POA: Diagnosis not present

## 2023-07-31 DIAGNOSIS — R590 Localized enlarged lymph nodes: Secondary | ICD-10-CM | POA: Diagnosis not present

## 2023-07-31 DIAGNOSIS — Z515 Encounter for palliative care: Secondary | ICD-10-CM

## 2023-07-31 DIAGNOSIS — Z8616 Personal history of COVID-19: Secondary | ICD-10-CM | POA: Diagnosis not present

## 2023-07-31 DIAGNOSIS — J9621 Acute and chronic respiratory failure with hypoxia: Secondary | ICD-10-CM | POA: Diagnosis not present

## 2023-07-31 DIAGNOSIS — Z91013 Allergy to seafood: Secondary | ICD-10-CM

## 2023-07-31 DIAGNOSIS — I2510 Atherosclerotic heart disease of native coronary artery without angina pectoris: Secondary | ICD-10-CM | POA: Diagnosis not present

## 2023-07-31 DIAGNOSIS — Z87891 Personal history of nicotine dependence: Secondary | ICD-10-CM | POA: Diagnosis not present

## 2023-07-31 DIAGNOSIS — J441 Chronic obstructive pulmonary disease with (acute) exacerbation: Secondary | ICD-10-CM | POA: Diagnosis not present

## 2023-07-31 DIAGNOSIS — J440 Chronic obstructive pulmonary disease with acute lower respiratory infection: Secondary | ICD-10-CM | POA: Diagnosis present

## 2023-07-31 DIAGNOSIS — Z7951 Long term (current) use of inhaled steroids: Secondary | ICD-10-CM

## 2023-07-31 DIAGNOSIS — R918 Other nonspecific abnormal finding of lung field: Secondary | ICD-10-CM

## 2023-07-31 DIAGNOSIS — J189 Pneumonia, unspecified organism: Secondary | ICD-10-CM | POA: Diagnosis not present

## 2023-07-31 DIAGNOSIS — Z8249 Family history of ischemic heart disease and other diseases of the circulatory system: Secondary | ICD-10-CM

## 2023-07-31 DIAGNOSIS — E669 Obesity, unspecified: Secondary | ICD-10-CM | POA: Diagnosis not present

## 2023-07-31 DIAGNOSIS — R0602 Shortness of breath: Secondary | ICD-10-CM | POA: Diagnosis not present

## 2023-07-31 DIAGNOSIS — R001 Bradycardia, unspecified: Secondary | ICD-10-CM | POA: Diagnosis not present

## 2023-07-31 DIAGNOSIS — Z888 Allergy status to other drugs, medicaments and biological substances status: Secondary | ICD-10-CM

## 2023-07-31 DIAGNOSIS — I272 Pulmonary hypertension, unspecified: Secondary | ICD-10-CM | POA: Diagnosis present

## 2023-07-31 DIAGNOSIS — R069 Unspecified abnormalities of breathing: Secondary | ICD-10-CM | POA: Diagnosis not present

## 2023-07-31 DIAGNOSIS — B9729 Other coronavirus as the cause of diseases classified elsewhere: Secondary | ICD-10-CM | POA: Diagnosis not present

## 2023-07-31 DIAGNOSIS — R0603 Acute respiratory distress: Secondary | ICD-10-CM | POA: Diagnosis present

## 2023-07-31 DIAGNOSIS — F17201 Nicotine dependence, unspecified, in remission: Secondary | ICD-10-CM

## 2023-07-31 DIAGNOSIS — Z6829 Body mass index (BMI) 29.0-29.9, adult: Secondary | ICD-10-CM

## 2023-07-31 DIAGNOSIS — Z7189 Other specified counseling: Secondary | ICD-10-CM | POA: Diagnosis not present

## 2023-07-31 DIAGNOSIS — J9611 Chronic respiratory failure with hypoxia: Secondary | ICD-10-CM | POA: Diagnosis not present

## 2023-07-31 DIAGNOSIS — I129 Hypertensive chronic kidney disease with stage 1 through stage 4 chronic kidney disease, or unspecified chronic kidney disease: Secondary | ICD-10-CM | POA: Diagnosis present

## 2023-07-31 DIAGNOSIS — N182 Chronic kidney disease, stage 2 (mild): Secondary | ICD-10-CM | POA: Diagnosis present

## 2023-07-31 DIAGNOSIS — Z1152 Encounter for screening for COVID-19: Secondary | ICD-10-CM | POA: Diagnosis not present

## 2023-07-31 DIAGNOSIS — I10 Essential (primary) hypertension: Secondary | ICD-10-CM | POA: Diagnosis not present

## 2023-07-31 DIAGNOSIS — R000 Tachycardia, unspecified: Secondary | ICD-10-CM | POA: Diagnosis not present

## 2023-07-31 DIAGNOSIS — J849 Interstitial pulmonary disease, unspecified: Secondary | ICD-10-CM | POA: Diagnosis not present

## 2023-07-31 LAB — T4, FREE: Free T4: 0.89 ng/dL (ref 0.61–1.12)

## 2023-07-31 LAB — I-STAT VENOUS BLOOD GAS, ED
Acid-Base Excess: 4 mmol/L — ABNORMAL HIGH (ref 0.0–2.0)
Bicarbonate: 29.5 mmol/L — ABNORMAL HIGH (ref 20.0–28.0)
Calcium, Ion: 1.12 mmol/L — ABNORMAL LOW (ref 1.15–1.40)
HCT: 47 % (ref 39.0–52.0)
Hemoglobin: 16 g/dL (ref 13.0–17.0)
O2 Saturation: 87 %
Potassium: 3.7 mmol/L (ref 3.5–5.1)
Sodium: 137 mmol/L (ref 135–145)
TCO2: 31 mmol/L (ref 22–32)
pCO2, Ven: 47.8 mmHg (ref 44–60)
pH, Ven: 7.399 (ref 7.25–7.43)
pO2, Ven: 54 mmHg — ABNORMAL HIGH (ref 32–45)

## 2023-07-31 LAB — CBC WITH DIFFERENTIAL/PLATELET
Abs Immature Granulocytes: 0.09 10*3/uL — ABNORMAL HIGH (ref 0.00–0.07)
Basophils Absolute: 0.1 10*3/uL (ref 0.0–0.1)
Basophils Relative: 1 %
Eosinophils Absolute: 0.2 10*3/uL (ref 0.0–0.5)
Eosinophils Relative: 1 %
HCT: 44.2 % (ref 39.0–52.0)
Hemoglobin: 13.1 g/dL (ref 13.0–17.0)
Immature Granulocytes: 1 %
Lymphocytes Relative: 23 %
Lymphs Abs: 3.3 10*3/uL (ref 0.7–4.0)
MCH: 23.4 pg — ABNORMAL LOW (ref 26.0–34.0)
MCHC: 29.6 g/dL — ABNORMAL LOW (ref 30.0–36.0)
MCV: 78.8 fL — ABNORMAL LOW (ref 80.0–100.0)
Monocytes Absolute: 1.7 10*3/uL — ABNORMAL HIGH (ref 0.1–1.0)
Monocytes Relative: 12 %
Neutro Abs: 8.7 10*3/uL — ABNORMAL HIGH (ref 1.7–7.7)
Neutrophils Relative %: 62 %
Platelets: 343 10*3/uL (ref 150–400)
RBC: 5.61 MIL/uL (ref 4.22–5.81)
RDW: 13.8 % (ref 11.5–15.5)
WBC: 14 10*3/uL — ABNORMAL HIGH (ref 4.0–10.5)
nRBC: 0 % (ref 0.0–0.2)

## 2023-07-31 LAB — BASIC METABOLIC PANEL WITH GFR
Anion gap: 11 (ref 5–15)
BUN: 10 mg/dL (ref 8–23)
CO2: 30 mmol/L (ref 22–32)
Calcium: 9.3 mg/dL (ref 8.9–10.3)
Chloride: 96 mmol/L — ABNORMAL LOW (ref 98–111)
Creatinine, Ser: 1.16 mg/dL (ref 0.61–1.24)
GFR, Estimated: >60 mL/min (ref >60)
Glucose, Bld: 130 mg/dL — ABNORMAL HIGH (ref 70–99)
Potassium: 3.7 mmol/L (ref 3.5–5.1)
Sodium: 137 mmol/L (ref 135–145)

## 2023-07-31 LAB — RESP PANEL BY RT-PCR (RSV, FLU A&B, COVID)  RVPGX2
Influenza A by PCR: NEGATIVE
Influenza B by PCR: NEGATIVE
Resp Syncytial Virus by PCR: NEGATIVE
SARS Coronavirus 2 by RT PCR: NEGATIVE

## 2023-07-31 LAB — LACTIC ACID, PLASMA
Lactic Acid, Venous: 3.6 mmol/L (ref 0.5–1.9)
Lactic Acid, Venous: 3.7 mmol/L (ref 0.5–1.9)

## 2023-07-31 LAB — ETHANOL: Alcohol, Ethyl (B): <15 mg/dL (ref <15)

## 2023-07-31 LAB — PROCALCITONIN: Procalcitonin: <0.1 ng/mL

## 2023-07-31 LAB — BRAIN NATRIURETIC PEPTIDE: B Natriuretic Peptide: 32.5 pg/mL (ref 0.0–100.0)

## 2023-07-31 LAB — TSH: TSH: 2.632 u[IU]/mL (ref 0.350–4.500)

## 2023-07-31 LAB — TROPONIN I (HIGH SENSITIVITY): Troponin I (High Sensitivity): 8 ng/L (ref <18)

## 2023-07-31 MED ORDER — HEPARIN SODIUM (PORCINE) 5000 UNIT/ML IJ SOLN
5000.0000 [IU] | Freq: Three times a day (TID) | INTRAMUSCULAR | Status: DC
  Administered 2023-07-31 – 2023-08-07 (×21): 5000 [IU] via SUBCUTANEOUS
  Filled 2023-07-31 (×21): qty 1

## 2023-07-31 MED ORDER — SODIUM CHLORIDE 0.9 % IV SOLN
500.0000 mg | INTRAVENOUS | Status: DC
  Administered 2023-07-31: 500 mg via INTRAVENOUS
  Filled 2023-07-31 (×2): qty 5

## 2023-07-31 MED ORDER — ALBUTEROL SULFATE (2.5 MG/3ML) 0.083% IN NEBU
15.0000 mg/h | INHALATION_SOLUTION | Freq: Once | RESPIRATORY_TRACT | Status: AC
  Administered 2023-07-31: 15 mg/h via RESPIRATORY_TRACT
  Filled 2023-07-31: qty 3

## 2023-07-31 MED ORDER — ALBUTEROL SULFATE (2.5 MG/3ML) 0.083% IN NEBU
3.0000 mL | INHALATION_SOLUTION | Freq: Four times a day (QID) | RESPIRATORY_TRACT | Status: DC | PRN
  Administered 2023-08-02: 3 mL via RESPIRATORY_TRACT
  Filled 2023-07-31: qty 3

## 2023-07-31 MED ORDER — MOMETASONE FURO-FORMOTEROL FUM 200-5 MCG/ACT IN AERO
2.0000 | INHALATION_SPRAY | Freq: Two times a day (BID) | RESPIRATORY_TRACT | Status: DC
  Administered 2023-08-01 – 2023-08-07 (×12): 2 via RESPIRATORY_TRACT
  Filled 2023-07-31 (×2): qty 8.8

## 2023-07-31 MED ORDER — PREDNISONE 20 MG PO TABS
40.0000 mg | ORAL_TABLET | Freq: Every day | ORAL | Status: AC
  Administered 2023-08-01 – 2023-08-05 (×5): 40 mg via ORAL
  Filled 2023-07-31 (×5): qty 2

## 2023-07-31 MED ORDER — THIAMINE HCL 100 MG/ML IJ SOLN
100.0000 mg | Freq: Every day | INTRAMUSCULAR | Status: DC
  Administered 2023-07-31 – 2023-08-01 (×2): 100 mg via INTRAVENOUS
  Filled 2023-07-31 (×2): qty 2

## 2023-07-31 MED ORDER — AMLODIPINE BESYLATE 10 MG PO TABS
10.0000 mg | ORAL_TABLET | Freq: Every day | ORAL | Status: DC
  Administered 2023-08-01 – 2023-08-07 (×7): 10 mg via ORAL
  Filled 2023-07-31 (×5): qty 1
  Filled 2023-07-31: qty 2
  Filled 2023-07-31: qty 1

## 2023-07-31 MED ORDER — SODIUM CHLORIDE 0.9% FLUSH
3.0000 mL | Freq: Two times a day (BID) | INTRAVENOUS | Status: DC
Start: 2023-07-31 — End: 2023-08-02
  Administered 2023-08-01: 3 mL via INTRAVENOUS

## 2023-07-31 MED ORDER — ACETAMINOPHEN 650 MG RE SUPP: 650.0000 mg | Freq: Four times a day (QID) | RECTAL | Status: DC | PRN

## 2023-07-31 MED ORDER — ACETAMINOPHEN 325 MG PO TABS: 650.0000 mg | ORAL_TABLET | Freq: Four times a day (QID) | ORAL | Status: DC | PRN

## 2023-07-31 MED ORDER — VITAMIN B-12 1000 MCG PO TABS
500.0000 ug | ORAL_TABLET | Freq: Every day | ORAL | Status: DC
  Administered 2023-07-31 – 2023-08-07 (×8): 500 ug via ORAL
  Filled 2023-07-31 (×8): qty 1

## 2023-07-31 MED ORDER — IOHEXOL 350 MG/ML SOLN
75.0000 mL | Freq: Once | INTRAVENOUS | Status: AC | PRN
  Administered 2023-07-31: 75 mL via INTRAVENOUS

## 2023-07-31 MED ORDER — NICOTINE 14 MG/24HR TD PT24
14.0000 mg | MEDICATED_PATCH | Freq: Every day | TRANSDERMAL | Status: DC
  Filled 2023-07-31 (×2): qty 1

## 2023-07-31 MED ORDER — IPRATROPIUM BROMIDE 0.02 % IN SOLN
0.5000 mg | Freq: Once | RESPIRATORY_TRACT | Status: AC
  Administered 2023-07-31: 0.5 mg via RESPIRATORY_TRACT
  Filled 2023-07-31: qty 2.5

## 2023-07-31 MED ORDER — GUAIFENESIN ER 600 MG PO TB12
600.0000 mg | ORAL_TABLET | Freq: Two times a day (BID) | ORAL | Status: DC
  Administered 2023-07-31 – 2023-08-07 (×14): 600 mg via ORAL
  Filled 2023-07-31 (×14): qty 1

## 2023-07-31 MED ORDER — GUAIFENESIN ER 600 MG PO TB12: 600.0000 mg | ORAL_TABLET | Freq: Two times a day (BID) | ORAL | Status: DC | PRN

## 2023-07-31 MED ORDER — SODIUM CHLORIDE 0.9 % IV SOLN
1.0000 g | INTRAVENOUS | Status: DC
  Administered 2023-07-31: 1 g via INTRAVENOUS
  Filled 2023-07-31: qty 10

## 2023-07-31 MED ORDER — MORPHINE SULFATE (PF) 2 MG/ML IV SOLN: 2.0000 mg | INTRAVENOUS | Status: DC | PRN

## 2023-07-31 MED ORDER — IPRATROPIUM-ALBUTEROL 0.5-2.5 (3) MG/3ML IN SOLN: 3.0000 mL | Freq: Four times a day (QID) | RESPIRATORY_TRACT | Status: DC

## 2023-07-31 MED ORDER — BENZONATATE 100 MG PO CAPS
200.0000 mg | ORAL_CAPSULE | Freq: Three times a day (TID) | ORAL | Status: DC | PRN
  Administered 2023-07-31 – 2023-08-02 (×3): 200 mg via ORAL
  Filled 2023-07-31 (×3): qty 2

## 2023-07-31 MED ORDER — SODIUM CHLORIDE 0.9% FLUSH
3.0000 mL | Freq: Two times a day (BID) | INTRAVENOUS | Status: DC
  Administered 2023-08-01: 3 mL via INTRAVENOUS

## 2023-07-31 MED ORDER — ALBUTEROL SULFATE (2.5 MG/3ML) 0.083% IN NEBU
2.5000 mg | INHALATION_SOLUTION | RESPIRATORY_TRACT | Status: DC
  Administered 2023-07-31 – 2023-08-01 (×7): 2.5 mg via RESPIRATORY_TRACT
  Filled 2023-07-31 (×7): qty 3

## 2023-07-31 MED ORDER — SODIUM CHLORIDE 0.9% FLUSH
3.0000 mL | INTRAVENOUS | Status: DC | PRN
Start: 2023-07-31 — End: 2023-08-07

## 2023-07-31 MED ORDER — HYDRALAZINE HCL 20 MG/ML IJ SOLN: 5.0000 mg | INTRAMUSCULAR | Status: DC | PRN

## 2023-07-31 MED ORDER — EZETIMIBE 10 MG PO TABS
10.0000 mg | ORAL_TABLET | Freq: Every day | ORAL | Status: DC
  Administered 2023-07-31 – 2023-08-07 (×8): 10 mg via ORAL
  Filled 2023-07-31 (×8): qty 1

## 2023-07-31 MED ORDER — PANTOPRAZOLE SODIUM 40 MG IV SOLR
40.0000 mg | Freq: Two times a day (BID) | INTRAVENOUS | Status: DC
  Administered 2023-07-31 – 2023-08-01 (×2): 40 mg via INTRAVENOUS
  Filled 2023-07-31 (×2): qty 10

## 2023-07-31 MED ORDER — SODIUM CHLORIDE 0.9 % IV SOLN: 1.0000 g | Freq: Once | INTRAVENOUS | Status: DC

## 2023-07-31 NOTE — Telephone Encounter (Signed)
FYI Beth  

## 2023-07-31 NOTE — Telephone Encounter (Signed)
 No further recommendations. Continue to monitor oxygen  levels.
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
## 2023-07-31 NOTE — Progress Notes (Addendum)
 [facility] night cross cover note:   I was notified by RN that this patient is requesting a cough suppressant.  I subsequently added prn Tessalon  Perles to his existing expectorant, mucinex .     Additionally, I followed up on this patient's CTA chest result, which showed no evidence of acute pulmonary embolism, but demonstrated lower lung pneumonia versus edema superimposed on a background of advanced emphysema.  It is noted that this patient is already on azithromycin  and Rocephin  for suspected community-acquired pneumonia. Will also add an updated BNP level to AM labs.    Camelia Cavalier, DO Hospitalist

## 2023-07-31 NOTE — ED Triage Notes (Signed)
 Pt BIBA from home w/ c/o SOB that started this morning. EMS arrived w/ pt sating @ 80% on 10L [place]. Pt in tripod position and diminished/absent breath sounds bilaterally. EMS administered duoneb tx and 125mg  solumed. Pt sated @ 96% during duoneb. Hx: COPD
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
## 2023-07-31 NOTE — Progress Notes (Signed)
 Pt removed from bipap at this time to 15L [place].  Pt tolerating well at this time.  Rt will continue to monitor.

## 2023-07-31 NOTE — ED Provider Notes (Signed)
 [place] EMERGENCY DEPARTMENT AT [facility] Provider Note   CSN: [id number] Arrival date & time: 07/31/23  1150     History  Chief Complaint  Patient presents with   Respiratory Distress    [name] [name] is a 63 y.o. male.  63 year old male presenting emergency department for acute respiratory distress in setting of end-stage COPD.  Recent hospitalization for the same.  Reports shortness of breath worsened this morning, no improvement after breathing treatments.  Reportedly 80% on his 10 L nasal cannula which is supposedly his baseline.  Received DuoNeb treatments x 3 and Solu-Medrol .  Reports minor improvement in his breathing.        Home Medications Prior to Admission medications   Medication Sig Start Date End Date Taking? Authorizing Provider  albuterol  (PROVENTIL ) (2.5 MG/3ML) 0.083% nebulizer solution TAKE 3 MLS (2.5 MG TOTAL) BY NEBULIZATION EVERY 6 (SIX) HOURS AS NEEDED FOR SHORTNESS OF BREATH. 07/11/23   [name], PA-C  albuterol  (VENTOLIN  HFA) 108 (90 Base) MCG/ACT inhaler Inhale 2 puffs into the lungs every 6 (six) hours as needed for wheezing or shortness of breath 07/11/23   [name], PA-C  allopurinol  (ZYLOPRIM ) 100 MG tablet Take 1 tablet (100 mg total) by mouth daily. 07/11/23   [name], PA-C  amLODipine  (NORVASC ) 10 MG tablet Take 1 tablet (10 mg total) by mouth daily. 07/11/23   [name], PA-C  Ascorbic Acid  (VITAMIN C ) 1000 MG tablet Take 1,000 mg by mouth daily.    [provider]  budesonide -formoterol  (SYMBICORT ) 160-4.5 MCG/ACT inhaler Inhale 2 puffs into the lungs 2 (two) times daily. 06/12/23   Mannam, Praveen, MD  ezetimibe  (ZETIA ) 10 MG tablet Take 1 tablet (10 mg total) by mouth daily. 07/11/23   [name], PA-C  ibuprofen  (ADVIL ) 200 MG tablet Take 800 mg by mouth daily as needed (gout swelling).    [provider]  metoprolol  succinate (TOPROL -XL) 50 MG 24 hr tablet Take 1 tablet (50 mg  total) by mouth daily. Patient not taking: Reported on 07/11/2023 05/23/23   Newlin, Enobong, MD  tiotropium (SPIRIVA  HANDIHALER) 18 MCG inhalation capsule PLACE 1 CAPSULE (18 MCG TOTAL) INTO INHALER AND INHALE DAILY. 07/11/23   McClung, Angela M, PA-C  vitamin B-12 (CYANOCOBALAMIN ) 500 MCG tablet Take 500 mcg by mouth daily.    [provider]  VITAMIN D PO Take 1 tablet by mouth daily.    [provider]  ALBUTEROL  IN Inhale into the lungs.  07/03/11  [provider]      Allergies    Atorvastatin , Shellfish allergy, Beef-derived drug products, and Colchicine     Review of Systems   Review of Systems  Physical Exam Updated Vital Signs BP 135/87   Pulse (!) 119   Temp 98.4 F (36.9 C) (Oral)   Resp (!) 26   Ht 5\' 6"  (1.676 m)   Wt 84 kg   SpO2 100%   BMI 29.89 kg/m  Physical Exam Vitals and nursing note reviewed.  Constitutional:      General: He is not in acute distress.    Appearance: He is not toxic-appearing.  HENT:     Head: Normocephalic.     Nose: Nose normal.     Mouth/Throat:     Mouth: Mucous membranes are moist.  Eyes:     Conjunctiva/sclera: Conjunctivae normal.  Cardiovascular:     Rate and Rhythm: Normal rate.  Pulmonary:     Effort: Pulmonary effort is  normal.     Breath sounds: Wheezing present.     Comments: Patient with moderate to severe respiratory distress.  Tachypnea, accessory muscle use.  Speaking in short sentences.  Poor air movement and some faint wheezing on exam. Musculoskeletal:     Right lower leg: Edema present.     Left lower leg: Edema present.  Skin:    General: Skin is warm.     Capillary Refill: Capillary refill takes less than 2 seconds.  Neurological:     Mental Status: He is alert and oriented to person, place, and time.  Psychiatric:        Mood and Affect: Mood normal.        Behavior: Behavior normal.     ED Results / Procedures / Treatments   Labs (all labs ordered are listed, but only  abnormal results are displayed) Labs Reviewed  BASIC METABOLIC PANEL WITH GFR - Abnormal; Notable for the following components:      Result Value   Chloride 96 (*)    Glucose, Bld 130 (*)    All other components within normal limits  CBC WITH DIFFERENTIAL/PLATELET - Abnormal; Notable for the following components:   WBC 14.0 (*)    MCV 78.8 (*)    MCH 23.4 (*)    MCHC 29.6 (*)    Neutro Abs 8.7 (*)    Monocytes Absolute 1.7 (*)    Abs Immature Granulocytes 0.09 (*)    All other components within normal limits  I-STAT VENOUS BLOOD GAS, ED - Abnormal; Notable for the following components:   pO2, Ven 54 (*)    Bicarbonate 29.5 (*)    Acid-Base Excess 4.0 (*)    Calcium , Ion 1.12 (*)    All other components within normal limits  RESP PANEL BY RT-PCR (RSV, FLU A&B, COVID)  [id number]    EKG None  Radiology No results found.  Procedures .Critical Care  Performed by: Rolinda Climes, DO Authorized by: Rolinda Climes, DO   Critical care provider statement:    Critical care time (minutes):  30   Critical care was necessary to treat or prevent imminent or life-threatening deterioration of the following conditions:  Respiratory failure   Critical care was time spent personally by me on the following activities:  Development of treatment plan with patient or surrogate, discussions with consultants, evaluation of patient's response to treatment, examination of patient, ordering and review of laboratory studies, ordering and review of radiographic studies, ordering and performing treatments and interventions, pulse oximetry, re-evaluation of patient's condition and review of old charts     Medications Ordered in ED Medications  azithromycin  (ZITHROMAX ) 500 mg in sodium chloride  0.9 % 250 mL IVPB (500 mg Intravenous New Bag/Given 07/31/23 1333)  albuterol  (PROVENTIL ) (2.5 MG/3ML) 0.083% nebulizer solution (15 mg/hr Nebulization Given 07/31/23 1211)  ipratropium (ATROVENT ) nebulizer solution  0.5 mg (0.5 mg Nebulization Given 07/31/23 1208)    ED Course/ Medical Decision Making/ A&P Clinical Course as of 07/31/23 1339  Tue Jul 31, 2023  1202 Discharged 3/12 for acute on chronic resp failure 2/2 COPD. Per review of d/c summary : "atient presented with a COPD exacerbation in the setting of coronavirus infection.  He is on 8 L O2 at baseline but was requiring upwards of 15 L high flow nasal cannula during admission.  He was treated with prednisone  which will be continued as a taper on discharge in addition to breathing treatments.  PCCM followed during admission and given hypercapnia  on ABG, started patient on BiPAP nightly.  Patient tolerated BiPAP well and hypercapnia improved.  His SOB improved during admission but he never returned to baseline O2 requirement. Patient will be discharged with 2 concentrators, flow valve, and Oxymizer to allow for 10 L O2 use at baseline and 15 L on exertion.  He met with palliative during admission and is now DNR.  Outpatient palliative consult is placed.  He will follow-up with PCP and pulmonology in the coming weeks. " [TY]  1256 Appears to be doing much better on BiPAP. [TY]  1321 Patient with mild white count, chest x-ray, independent interpretation with possible right lower lobe infiltrate versus atelectasis.  Covered with azithromycin .  No significant metabolic derangements.  VBG also reassuring.  Given patient's increased oxygen  requirments requring bipap will admit for acute hypoxic respiratory failure will admit to the hospitalist. [TY]  1339 Spoke with Dr. Lydia Sams, [facility] hospitalist for admission. [TY]    Clinical Course User Index [TY] Rolinda Climes, DO                                 Medical Decision Making This is a 63 year old male presenting emergency department for respiratory distress.  History of COPD on 10 L of oxygen , following with palliative care as he seems to be in stage.  Recent hospitalization last month for similar presentation,  on baseline 10 L nasal cannula.  EMS reported 80% on 10 L nasal cannula.  And reported to be in significant respiratory distress/tripoding. received DuoNebs x 3 and Solu-Medrol  prior to arrival.  Still in moderate to severe respiratory distress on arrival, placed on BiPAP.  Continuous neb ordered.  Basic labs and chest x-ray ordered as well  See ED course for further MDM/disposition.   Amount and/or Complexity of Data Reviewed Labs: ordered. Decision-making details documented in ED Course. Radiology: ordered. Decision-making details documented in ED Course. ECG/medicine tests: ordered. Decision-making details documented in ED Course. Discussion of management or test interpretation with external provider(s): Spoke with Dr. Lydia Sams with [facility] for admission.  Risk Prescription drug management. Decision regarding hospitalization.         Final Clinical Impression(s) / ED Diagnoses Final diagnoses:  COPD exacerbation [facility])    Rx / DC Orders ED Discharge Orders     None         Rolinda Climes, DO 07/31/23 1339

## 2023-08-01 DIAGNOSIS — J189 Pneumonia, unspecified organism: Secondary | ICD-10-CM

## 2023-08-01 DIAGNOSIS — R918 Other nonspecific abnormal finding of lung field: Secondary | ICD-10-CM | POA: Diagnosis not present

## 2023-08-01 DIAGNOSIS — J9621 Acute and chronic respiratory failure with hypoxia: Secondary | ICD-10-CM | POA: Diagnosis not present

## 2023-08-01 DIAGNOSIS — J441 Chronic obstructive pulmonary disease with (acute) exacerbation: Secondary | ICD-10-CM

## 2023-08-01 LAB — BRAIN NATRIURETIC PEPTIDE: B Natriuretic Peptide: 58.2 pg/mL (ref 0.0–100.0)

## 2023-08-01 MED ORDER — SODIUM CHLORIDE 0.9 % IV SOLN
2.0000 g | INTRAVENOUS | Status: AC
  Administered 2023-08-01 – 2023-08-04 (×4): 2 g via INTRAVENOUS
  Filled 2023-08-01 (×4): qty 20

## 2023-08-01 MED ORDER — PANTOPRAZOLE SODIUM 40 MG PO TBEC
40.0000 mg | DELAYED_RELEASE_TABLET | Freq: Two times a day (BID) | ORAL | Status: DC
  Administered 2023-08-01 – 2023-08-07 (×12): 40 mg via ORAL
  Filled 2023-08-01 (×12): qty 1

## 2023-08-01 MED ORDER — THIAMINE MONONITRATE 100 MG PO TABS
100.0000 mg | ORAL_TABLET | Freq: Every day | ORAL | Status: DC
  Administered 2023-08-02 – 2023-08-07 (×6): 100 mg via ORAL
  Filled 2023-08-01 (×6): qty 1

## 2023-08-01 MED ORDER — ALBUTEROL SULFATE (2.5 MG/3ML) 0.083% IN NEBU
2.5000 mg | INHALATION_SOLUTION | Freq: Two times a day (BID) | RESPIRATORY_TRACT | Status: DC
  Administered 2023-08-02 – 2023-08-07 (×11): 2.5 mg via RESPIRATORY_TRACT
  Filled 2023-08-01 (×11): qty 3

## 2023-08-01 MED ORDER — AZITHROMYCIN 500 MG PO TABS
500.0000 mg | ORAL_TABLET | Freq: Every day | ORAL | Status: DC
  Administered 2023-08-01 – 2023-08-07 (×7): 500 mg via ORAL
  Filled 2023-08-01 (×7): qty 1

## 2023-08-01 NOTE — Assessment & Plan Note (Signed)
-   RLL infiltrates on CTA - continue rocephin  and azithro

## 2023-08-01 NOTE — Assessment & Plan Note (Signed)
-   He endorses no further tobacco use in years - DC nicotine  patch

## 2023-08-01 NOTE — Hospital Course (Signed)
 [name] is a 63 yo male with PMH COPD, chronic hypoxia (on 10L O2 at home), HTN, pulmonary HTN, CKD, history of tobacco use who presented with worsening shortness of breath and worsening hypoxia on home oxygen  settings. CT angio chest was negative for PE and showed right lower lobe pneumonia with background of advanced emphysema.  Also new lung nodules noted.  He was started on antibiotics and admitted for further oxygen  weaning.

## 2023-08-01 NOTE — Assessment & Plan Note (Signed)
-   per CTA chest: ". Irregular 2.1 cm nodule in the lingula and 10 mm nodule in the right upper lobe. Given associated mediastinal and hilar lymphadenopathy these are concerning for malignancy. PET/CT or tissue sampling is recommended" - discussed with patient and he understands findings - tentative we plan to treat acute issues then pursue further workup/biopsy next

## 2023-08-01 NOTE — Assessment & Plan Note (Signed)
-   On 10 L oxygen  at baseline at home and requiring up to 15 L [place] high flow in the ER - Etiology presumed in setting of right lower lobe pneumonia - Continue weaning back to home settings as able

## 2023-08-01 NOTE — Plan of Care (Signed)
   Problem: Activity: Goal: Risk for activity intolerance will decrease Outcome: Progressing   Problem: Nutrition: Goal: Adequate nutrition will be maintained Outcome: Progressing

## 2023-08-01 NOTE — Assessment & Plan Note (Signed)
-   Suspect exacerbated in setting of superimposed pneumonia.  He denies any tobacco use - Continue antibiotics and breathing treatments - Continue prednisone

## 2023-08-01 NOTE — Progress Notes (Signed)
 Progress Note    [name] [name]   ZHY:[id number]  DOB: Aug 15, 1960  DOA: 07/31/2023     1 PCP: Joaquin Mulberry, MD  Initial CC: worsening hypoxia  Hospital Course: [name] is a 63 yo male with PMH COPD, chronic hypoxia (on 10L O2 at home), HTN, pulmonary HTN, CKD, history of tobacco use who presented with worsening shortness of breath and worsening hypoxia on home oxygen  settings. CT angio chest was negative for PE and showed right lower lobe pneumonia with background of advanced emphysema.  Also new lung nodules noted.  He was started on antibiotics and admitted for further oxygen  weaning.  Interval History:  Breathing a little more comfortably when seen in the ER this morning but was on 15 L salter high flow.  Reviewed results of CT with him and treatment for pneumonia.  Also reviewed findings of pulmonary nodules which will need further workup possibly outpatient.  Assessment and Plan: * Acute on chronic respiratory failure with hypoxia (HCC) - On 10 L oxygen  at baseline at home and requiring up to 15 L salter high flow in the ER - Etiology presumed in setting of right lower lobe pneumonia - Continue weaning back to home settings as able  CAP (community acquired pneumonia) - RLL infiltrates on CTA - continue rocephin  and azithro  Pulmonary nodules - per CTA chest: ". Irregular 2.1 cm nodule in the lingula and 10 mm nodule in the right upper lobe. Given associated mediastinal and hilar lymphadenopathy these are concerning for malignancy. PET/CT or tissue sampling is recommended" - discussed with patient and he understands findings - tentative we plan to treat acute issues then pursue further workup/biopsy next  COPD exacerbation (HCC) - Suspect exacerbated in setting of superimposed pneumonia.  He denies any tobacco use - Continue antibiotics and breathing treatments - Continue prednisone   Hypertension - Continue amlodipine   CKD (chronic kidney disease) stage 2, GFR 60-89  ml/min - patient has history of CKD2. Baseline creat ~ 1.1 - 1.2   Tobacco abuse, in remission - He endorses no further tobacco use in years - DC nicotine  patch   Old records reviewed in assessment of this patient  Antimicrobials: Azithromycin  07/31/2023 >> current Rocephin  07/31/2023 >> current  DVT prophylaxis:  heparin  injection 5,000 Units Start: 07/31/23 1615   Code Status:   Code Status: Full Code  Mobility Assessment (Last 72 Hours)     Mobility Assessment     Row Name 08/01/23 1345 08/01/23 1057 08/01/23 0900       Does patient have an order for bedrest or is patient medically unstable No - Continue assessment -- --     What is the highest level of mobility based on the progressive mobility assessment? Level 5 (Walks with assist in room/hall) - Balance while stepping forward/back and can walk in room with assist - Complete Level 5 (Walks with assist in room/hall) - Balance while stepping forward/back and can walk in room with assist - Complete Level 5 (Walks with assist in room/hall) - Balance while stepping forward/back and can walk in room with assist - Complete              Barriers to discharge: none Disposition Plan:  Home HH orders placed: n/a Status is: Inpt  Objective: Blood pressure (!) 145/89, pulse 99, temperature 98.7 F (37.1 C), temperature source Oral, resp. rate 16, height 5\' 6"  (1.676 m), weight 84 kg, SpO2 94%.  Examination:  Physical Exam Constitutional:      General: He  is not in acute distress.    Appearance: Normal appearance. He is obese.  HENT:     Head: Normocephalic and atraumatic.     Mouth/Throat:     Mouth: Mucous membranes are moist.  Eyes:     Extraocular Movements: Extraocular movements intact.  Cardiovascular:     Rate and Rhythm: Normal rate and regular rhythm.  Pulmonary:     Effort: Pulmonary effort is normal. No respiratory distress.     Breath sounds: Rhonchi present. No wheezing.  Abdominal:     General: Bowel  sounds are normal. There is no distension.     Palpations: Abdomen is soft.     Tenderness: There is no abdominal tenderness.  Musculoskeletal:        General: Normal range of motion.     Cervical back: Normal range of motion and neck supple.     Right lower leg: Edema present.     Left lower leg: Edema present.  Skin:    General: Skin is warm and dry.  Neurological:     General: No focal deficit present.     Mental Status: He is alert.  Psychiatric:        Mood and Affect: Mood normal.        Behavior: Behavior normal.      Consultants:    Procedures:    Data Reviewed: Results for orders placed or performed during the hospital encounter of 07/31/23 (from the past 24 hours)  Lactic acid, plasma     Status: Abnormal   Collection Time: 07/31/23  6:40 PM  Result Value Ref Range   Lactic Acid, Venous 3.7 (HH) 0.5 - 1.9 mmol/L  Ethanol     Status: None   Collection Time: 07/31/23  6:40 PM  Result Value Ref Range   Alcohol, Ethyl (B) <15 <15 mg/dL  Troponin I (High Sensitivity)     Status: None   Collection Time: 07/31/23  6:40 PM  Result Value Ref Range   Troponin I (High Sensitivity) 8 <18 ng/L  Brain natriuretic peptide     Status: None   Collection Time: 08/01/23  5:00 AM  Result Value Ref Range   B Natriuretic Peptide 58.2 0.0 - 100.0 pg/mL    I have reviewed pertinent nursing notes, vitals, labs, and images as necessary. I have ordered labwork to follow up on as indicated.  I have reviewed the last notes from staff over past 24 hours. I have discussed patient's care plan and test results with nursing staff, CM/SW, and other staff as appropriate.  Time spent: Greater than 50% of the 55 minute visit was spent in counseling/coordination of care for the patient as laid out in the A&P.   LOS: 1 day   Faith Homes, MD [facility] 08/01/2023, 6:09 PM

## 2023-08-01 NOTE — Evaluation (Signed)
 Occupational Therapy Evaluation Patient Details Name: [name] [name] MRN: [id number] DOB: Aug 14, 1960 Today's Date: 08/01/2023   History of Present Illness   Pt is a 63 y/o male presenting on 4/22 with respiratory distress. CT angio negative for PE, but demonstrated lower lung PNA vs edema superimposed on advanced emphysema. PMH of COPD, CKD III, HTN, gout.     Clinical Impressions PTA patient reports independent with ADLs (sponge bathes at baseline), mobility around home pushing w/c and family managing IADLs.  Admitted for above and presents with problem list below.  Pt reports baseline on 10L, increasing to 15L during mobility at home. Today on 10L via [place] on RA at rest with SpO2 stable, desaturated to 84% when donning socks and increased to 15L; desaturated on 15L during short distance mobility to 84-86% and required several minutes to recover.  Able to decreased to 10L to maintaining saturations >90% upon exit.  Pt managing ADLs and mobility using RA with supervision. Pt will benefit from continued OT services acutely but anticipate no futher needs after dc home.       If plan is discharge home, recommend the following:   A little help with walking and/or transfers;A little help with bathing/dressing/bathroom;Assistance with cooking/housework;Assist for transportation;Help with stairs or ramp for entrance     Functional Status Assessment   Patient has had a recent decline in their functional status and demonstrates the ability to make significant improvements in function in a reasonable and predictable amount of time.     Equipment Recommendations   BSC/3in1     Recommendations for Other Services         Precautions/Restrictions   Precautions Precautions: Fall Recall of Precautions/Restrictions: Intact Precaution/Restrictions Comments: watch O2 Restrictions Weight Bearing Restrictions Per Provider Order: No     Mobility Bed Mobility               General bed  mobility comments: EOB upon entry and at exit    Transfers Overall transfer level: Needs assistance Equipment used: Rolling walker (2 wheels) Transfers: Sit to/from Stand Sit to Stand: Supervision                  Balance Overall balance assessment: Needs assistance Sitting-balance support: No upper extremity supported, Feet supported Sitting balance-Leahy Scale: Good     Standing balance support: Bilateral upper extremity supported, No upper extremity supported, During functional activity Standing balance-Leahy Scale: Fair                             ADL either performed or assessed with clinical judgement   ADL Overall ADL's : Needs assistance/impaired     Grooming: Supervision/safety;Standing           Upper Body Dressing : Set up;Sitting   Lower Body Dressing: Supervision/safety;Sit to/from stand   Toilet Transfer: Supervision/safety;Ambulation;Rolling walker (2 wheels)           Functional mobility during ADLs: Supervision/safety;Rolling walker (2 wheels)       Vision   Vision Assessment?: No apparent visual deficits     Perception         Praxis         Pertinent Vitals/Pain Pain Assessment Pain Assessment: No/denies pain     Extremity/Trunk Assessment Upper Extremity Assessment Upper Extremity Assessment: Overall WFL for tasks assessed   Lower Extremity Assessment Lower Extremity Assessment: Defer to PT evaluation       Communication Communication Communication: No apparent difficulties  Cognition Arousal: Alert Behavior During Therapy: WFL for tasks assessed/performed Cognition: No apparent impairments                               Following commands: Intact       Cueing  General Comments   Cueing Techniques: Verbal cues  10L upon entry, SpO2 >90% at rest but with ADLs (donning socks) SpO2 desaturated to 84% and pt having difficulty recovering therefore increased to 15L.  Maintained 15L  during mobility with desaturation to 84-86% with several minutes required to recover (standing in forward lean). Placed back on 10L before exiting with Spo2 >90%. Cueing for PLB during session.   Exercises     Shoulder Instructions      Home Living Family/patient expects to be discharged to:: Private residence Living Arrangements: Children (daughter/grandchildren) Available Help at Discharge: Family;Available 24 hours/day Type of Home: House Home Access: Level entry     Home Layout: One level     Bathroom Shower/Tub: Sponge bathes at baseline (stands mostly at this sink)   Firefighter: Standard     Home Equipment: Wheelchair - manual;Cane - single point   Additional Comments: 10L at rest, reports 15L during mobility      Prior Functioning/Environment Prior Level of Function : Independent/Modified Independent             Mobility Comments: pushes a wheelchair for mobility ADLs Comments: ind ADLs, manages meds. Family assists with IADLs    OT Problem List: Decreased activity tolerance;Cardiopulmonary status limiting activity;Decreased knowledge of precautions;Decreased knowledge of use of DME or AE   OT Treatment/Interventions: Self-care/ADL training;Energy conservation;DME and/or AE instruction;Therapeutic activities;Patient/family education      OT Goals(Current goals can be found in the care plan section)   Acute Rehab OT Goals Patient Stated Goal: home when i'm better OT Goal Formulation: With patient Time For Goal Achievement: 08/15/23 Potential to Achieve Goals: Good   OT Frequency:  Min 2X/week    Co-evaluation PT/OT/SLP Co-Evaluation/Treatment: Yes Reason for Co-Treatment: For patient/therapist safety;To address functional/ADL transfers;Other (comment) (activity tolerance)   OT goals addressed during session: ADL's and self-care      AM-PAC OT "6 Clicks" Daily Activity     Outcome Measure Help from another person eating meals?: None Help  from another person taking care of personal grooming?: A Little Help from another person toileting, which includes using toliet, bedpan, or urinal?: A Little Help from another person bathing (including washing, rinsing, drying)?: A Little Help from another person to put on and taking off regular upper body clothing?: A Little Help from another person to put on and taking off regular lower body clothing?: A Little 6 Click Score: 19   End of Session Equipment Utilized During Treatment: Rolling walker (2 wheels);Oxygen  Nurse Communication: Mobility status  Activity Tolerance: Patient tolerated treatment well Patient left: with call bell/phone within reach;Other (comment) (EOB)  OT Visit Diagnosis: Other abnormalities of gait and mobility (R26.89);Other (comment) (decreased activity tolerance)                Time: 4098-1191 OT Time Calculation (min): 22 min Charges:  OT General Charges $OT Visit: 1 Visit OT Evaluation $OT Eval Low Complexity: 1 Low  [name] [name], OT Acute Rehabilitation Services Office [phone] Secure Chat Preferred    [name] [name] 08/01/2023, 10:18 AM

## 2023-08-01 NOTE — Assessment & Plan Note (Signed)
 Continue amlodipine

## 2023-08-01 NOTE — Assessment & Plan Note (Signed)
-   patient has history of CKD2. Baseline creat ~ 1.1 - 1.2

## 2023-08-01 NOTE — Progress Notes (Signed)
 PHARMACIST - PHYSICIAN COMMUNICATION DR:   Gladstone Lamer CONCERNING: Antibiotic IV to Oral Route Change Policy  RECOMMENDATION: This patient is receiving azithromycin  by the intravenous route.  Based on criteria approved by the Pharmacy and Therapeutics Committee, the antibiotic(s) is/are being converted to the equivalent oral dose form(s).   DESCRIPTION: These criteria include: Patient being treated for a respiratory tract infection, urinary tract infection, cellulitis or clostridium difficile associated diarrhea if on metronidazole The patient is not neutropenic and does not exhibit a GI malabsorption state The patient is eating (either orally or via tube) and/or has been taking other orally administered medications for a least 24 hours The patient is improving clinically and has a Tmax < 100.5  If you have questions about this conversion, please contact the Pharmacy Department  []   [phone] )  Cristine Done []   [phone] )  [facility] [x]   [phone] )  Arlin Benes []   [phone] )  [facility] []   [phone] )  [facility]

## 2023-08-01 NOTE — Evaluation (Signed)
 Physical Therapy Evaluation Patient Details Name: [name] [name] MRN: [id number] DOB: 1960/11/30 Today's Date: 08/01/2023  History of Present Illness  Pt is a 63 y/o male presenting on 4/22 with respiratory distress. CT angio negative for PE, but demonstrated lower lung PNA vs edema superimposed on advanced emphysema. PMH of COPD, CKD III, HTN, gout.  Clinical Impression  Patient presents with limited activity tolerance due to decreased cardiorespiratory endurance.  He normally mobilizes pushing his wheelchair getting to bathroom and living room in the home.  States since last admission using 10L at rest and up to 15 for mobility.  States he could not get walker and wheelchair so got wheelchair only last admission.  Feel he may benefit from rollator walker if able to get covered.  Patient able to ambulate short distance with RW and noted hypoxic despite increased O2 to 15L.  Patient will benefit from skilled PT in the acute setting to facilitate progression of tolerance to allow d/c home with family support.  Do not feel he will need follow up PT at d/c pending progress.         If plan is discharge home, recommend the following: Assistance with cooking/housework;Assist for transportation;Help with stairs or ramp for entrance   Can travel by private vehicle        Equipment Recommendations Rollator (4 wheels)  Recommendations for Other Services       Functional Status Assessment Patient has had a recent decline in their functional status and demonstrates the ability to make significant improvements in function in a reasonable and predictable amount of time.     Precautions / Restrictions Precautions Precautions: Fall Precaution/Restrictions Comments: watch O2      Mobility  Bed Mobility               General bed mobility comments: EOB upon entry and at exit    Transfers Overall transfer level: Needs assistance Equipment used: Rolling walker (2 wheels) Transfers: Sit to/from  Stand Sit to Stand: Supervision           General transfer comment: moves to RW at a little distance from EOB    Ambulation/Gait Ambulation/Gait assistance: Supervision Gait Distance (Feet): 25 Feet Assistive device: Rolling walker (2 wheels) Gait Pattern/deviations: Step-to pattern, Step-through pattern, Decreased stride length, Trunk flexed       General Gait Details: heavy UE support using accessory muscles of respiration throughout and standing to recover back in the room  Stairs            Wheelchair Mobility     Tilt Bed    Modified Rankin (Stroke Patients Only)       Balance Overall balance assessment: Needs assistance Sitting-balance support: No upper extremity supported, Feet supported Sitting balance-Leahy Scale: Good Sitting balance - Comments: dons socks at EOB   Standing balance support: Bilateral upper extremity supported, No upper extremity supported, During functional activity Standing balance-Leahy Scale: Fair Standing balance comment: moves to RW from EOB standing without UE support                             Pertinent Vitals/Pain Pain Assessment Pain Assessment: No/denies pain    Home Living Family/patient expects to be discharged to:: Private residence Living Arrangements: Children (daughter/grandchildren) Available Help at Discharge: Family;Available 24 hours/day Type of Home: House Home Access: Level entry       Home Layout: One level Home Equipment: Wheelchair - manual;Cane - single point Additional  Comments: 10L at rest, reports 15L during mobility    Prior Function Prior Level of Function : Independent/Modified Independent             Mobility Comments: pushes a wheelchair for mobility ADLs Comments: ind ADLs, manages meds. Family assists with IADLs     Extremity/Trunk Assessment   Upper Extremity Assessment Upper Extremity Assessment: Defer to OT evaluation    Lower Extremity Assessment Lower  Extremity Assessment: Generalized weakness    Cervical / Trunk Assessment Cervical / Trunk Assessment: Kyphotic  Communication   Communication Communication: No apparent difficulties    Cognition Arousal: Alert Behavior During Therapy: WFL for tasks assessed/performed   PT - Cognitive impairments: No apparent impairments                         Following commands: Intact       Cueing Cueing Techniques: Verbal cues     General Comments General comments (skin integrity, edema, etc.): patient on 10L salter  with SpO2 98%; desat to 84% donning socks at EOB bumped to 15L and recovery >90% in less than 2 minutes; ambulated on 15L portable O2 and SpO2 down to 83%; standing recovery x 2.5-3 minutes back to 92% prior to moving back to 10L wall O2    Exercises     Assessment/Plan    PT Assessment Patient needs continued PT services  PT Problem List Cardiopulmonary status limiting activity;Decreased mobility;Decreased activity tolerance       PT Treatment Interventions DME instruction;Gait training;Therapeutic exercise;Functional mobility training;Therapeutic activities;Patient/family education;Wheelchair mobility training    PT Goals (Current goals can be found in the Care Plan section)  Acute Rehab PT Goals Patient Stated Goal: get better before going home PT Goal Formulation: With patient Time For Goal Achievement: 08/15/23 Potential to Achieve Goals: Fair    Frequency Min 2X/week     Co-evaluation PT/OT/SLP Co-Evaluation/Treatment: Yes Reason for Co-Treatment: For patient/therapist safety;To address functional/ADL transfers;Other (comment) (actrivity tolerance) PT goals addressed during session: Mobility/safety with mobility;Other (comment) (cardiorespiratory endurance) OT goals addressed during session: ADL's and self-care       AM-PAC PT "6 Clicks" Mobility  Outcome Measure Help needed turning from your back to your side while in a flat bed without  using bedrails?: None Help needed moving from lying on your back to sitting on the side of a flat bed without using bedrails?: None Help needed moving to and from a bed to a chair (including a wheelchair)?: None Help needed standing up from a chair using your arms (e.g., wheelchair or bedside chair)?: None Help needed to walk in hospital room?: A Little Help needed climbing 3-5 steps with a railing? : Total 6 Click Score: 20    End of Session Equipment Utilized During Treatment: Oxygen  Activity Tolerance: Treatment limited secondary to medical complications (Comment) Patient left: in bed;with call bell/phone within reach   PT Visit Diagnosis: Difficulty in walking, not elsewhere classified (R26.2);Muscle weakness (generalized) (M62.81)    Time: 1610-9604 PT Time Calculation (min) (ACUTE ONLY): 20 min   Charges:   PT Evaluation $PT Eval Moderate Complexity: 1 Mod   PT General Charges $$ ACUTE PT VISIT: 1 Visit         Abigail Hoff, PT Acute Rehabilitation Services Office:[phone] 08/01/2023   [name] [name] 08/01/2023, 10:59 AM

## 2023-08-02 ENCOUNTER — Telehealth: Payer: Self-pay | Admitting: Pulmonary Disease

## 2023-08-02 DIAGNOSIS — J449 Chronic obstructive pulmonary disease, unspecified: Secondary | ICD-10-CM | POA: Diagnosis not present

## 2023-08-02 DIAGNOSIS — Z515 Encounter for palliative care: Secondary | ICD-10-CM | POA: Diagnosis not present

## 2023-08-02 DIAGNOSIS — J189 Pneumonia, unspecified organism: Secondary | ICD-10-CM | POA: Diagnosis not present

## 2023-08-02 DIAGNOSIS — J9611 Chronic respiratory failure with hypoxia: Secondary | ICD-10-CM | POA: Diagnosis not present

## 2023-08-02 DIAGNOSIS — J9621 Acute and chronic respiratory failure with hypoxia: Secondary | ICD-10-CM | POA: Diagnosis not present

## 2023-08-02 DIAGNOSIS — Z7189 Other specified counseling: Secondary | ICD-10-CM | POA: Diagnosis not present

## 2023-08-02 LAB — BASIC METABOLIC PANEL WITH GFR
Anion gap: 9 (ref 5–15)
BUN: 16 mg/dL (ref 8–23)
CO2: 31 mmol/L (ref 22–32)
Calcium: 9.2 mg/dL (ref 8.9–10.3)
Chloride: 98 mmol/L (ref 98–111)
Creatinine, Ser: 1.17 mg/dL (ref 0.61–1.24)
GFR, Estimated: >60 mL/min (ref >60)
Glucose, Bld: 89 mg/dL (ref 70–99)
Potassium: 4.1 mmol/L (ref 3.5–5.1)
Sodium: 138 mmol/L (ref 135–145)

## 2023-08-02 LAB — CBC WITH DIFFERENTIAL/PLATELET
Abs Immature Granulocytes: 0.08 10*3/uL — ABNORMAL HIGH (ref 0.00–0.07)
Basophils Absolute: 0 10*3/uL (ref 0.0–0.1)
Basophils Relative: 0 %
Eosinophils Absolute: 0 10*3/uL (ref 0.0–0.5)
Eosinophils Relative: 0 %
HCT: 36.6 % — ABNORMAL LOW (ref 39.0–52.0)
Hemoglobin: 11.2 g/dL — ABNORMAL LOW (ref 13.0–17.0)
Immature Granulocytes: 0 %
Lymphocytes Relative: 11 %
Lymphs Abs: 2.1 10*3/uL (ref 0.7–4.0)
MCH: 23 pg — ABNORMAL LOW (ref 26.0–34.0)
MCHC: 30.6 g/dL (ref 30.0–36.0)
MCV: 75.2 fL — ABNORMAL LOW (ref 80.0–100.0)
Monocytes Absolute: 1.7 10*3/uL — ABNORMAL HIGH (ref 0.1–1.0)
Monocytes Relative: 9 %
Neutro Abs: 14.7 10*3/uL — ABNORMAL HIGH (ref 1.7–7.7)
Neutrophils Relative %: 80 %
Platelets: 316 10*3/uL (ref 150–400)
RBC: 4.87 MIL/uL (ref 4.22–5.81)
RDW: 13.5 % (ref 11.5–15.5)
WBC: 18.6 10*3/uL — ABNORMAL HIGH (ref 4.0–10.5)
nRBC: 0 % (ref 0.0–0.2)

## 2023-08-02 LAB — GLUCOSE, CAPILLARY: Glucose-Capillary: 84 mg/dL (ref 70–99)

## 2023-08-02 LAB — PROCALCITONIN: Procalcitonin: 0.44 ng/mL

## 2023-08-02 LAB — C-REACTIVE PROTEIN: CRP: 1.3 mg/dL — ABNORMAL HIGH (ref <1.0)

## 2023-08-02 LAB — MAGNESIUM: Magnesium: 2.2 mg/dL (ref 1.7–2.4)

## 2023-08-02 MED ORDER — TRAMADOL HCL 50 MG PO TABS: 50.0000 mg | ORAL_TABLET | Freq: Two times a day (BID) | ORAL | Status: DC | PRN

## 2023-08-02 MED ORDER — ENSURE ENLIVE PO LIQD
237.0000 mL | Freq: Two times a day (BID) | ORAL | Status: DC
  Administered 2023-08-02 – 2023-08-04 (×3): 237 mL via ORAL

## 2023-08-02 MED ORDER — TIOTROPIUM BROMIDE MONOHYDRATE 18 MCG IN CAPS: 18.0000 ug | ORAL_CAPSULE | Freq: Every day | RESPIRATORY_TRACT | Status: DC

## 2023-08-02 MED ORDER — UMECLIDINIUM BROMIDE 62.5 MCG/ACT IN AEPB
1.0000 | INHALATION_SPRAY | Freq: Every day | RESPIRATORY_TRACT | Status: DC
  Filled 2023-08-02: qty 7

## 2023-08-02 MED ORDER — TIOTROPIUM BROMIDE MONOHYDRATE 18 MCG IN CAPS
18.0000 ug | ORAL_CAPSULE | Freq: Every day | RESPIRATORY_TRACT | Status: DC
  Administered 2023-08-03 – 2023-08-06 (×4): 18 ug via RESPIRATORY_TRACT
  Filled 2023-08-02 (×6): qty 1

## 2023-08-02 MED ORDER — ADULT MULTIVITAMIN W/MINERALS CH
1.0000 | ORAL_TABLET | Freq: Every day | ORAL | Status: DC
  Administered 2023-08-02 – 2023-08-07 (×6): 1 via ORAL
  Filled 2023-08-02 (×6): qty 1

## 2023-08-02 NOTE — Evaluation (Signed)
 Clinical/Bedside Swallow Evaluation Patient Details  Name: [name] [name] MRN: [id number] Date of Birth: Jul 25, 1960  Today's Date: 08/02/2023 Time: SLP Start Time (ACUTE ONLY): 1327 SLP Stop Time (ACUTE ONLY): 1339 SLP Time Calculation (min) (ACUTE ONLY): 12 min  Past Medical History:  Past Medical History:  Diagnosis Date   Asthma 04/10/1997   CKD (chronic kidney disease), 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   COPD (chronic obstructive pulmonary disease) (HCC)    on 4L home O2   Essential hypertension    Gout    Thrombocytopenia (HCC) 07/28/2012   Past Surgical History:  Past Surgical History:  Procedure Laterality Date   FRACTURE SURGERY  childhood   right arm   KNEE SURGERY     right knee s/p trauma   HPI:  [name] [name] is a 63 yo male presenting to ED 4/22 with worsening SOB on home O2 settings. CT Chest negative for PE but showed RLL PNA with background of advanced emphysema in addition to an irregular 2.1 cm nodule in the lingula and a 10 mm nodule in the RUL. PMH includes COPD, chronic hypoxia (on 10L supplemental O2 at baseline), HTN, CKD, history of tobacco use    Assessment / Plan / Recommendation  Clinical Impression  Pt's dentition is limited to one lower tooth, but he states it does not inhibit the solids he is able to consume. Pt's mastication of regular solids was timely and he achieved complete oral clearance. He completed the 3 oz water test with delayed throat clearance noted. Discussed completing an MBS given RLL PNA, with which he was agreeable. Will proceed with an MBS (next date at the earliest). Pt can continue his current diet in the interim unless he requires BiPAP, in which case POs should be held. SLP Visit Diagnosis: Dysphagia, unspecified (R13.10)    Aspiration Risk  Mild aspiration risk    Diet Recommendation Regular;Thin liquid    Liquid Administration via: Cup;Straw Medication Administration: Whole meds with liquid Supervision: Patient able to self  feed Compensations: Slow rate;Small sips/bites Postural Changes: Seated upright at 90 degrees    Other  Recommendations Oral Care Recommendations: Oral care BID    Recommendations for follow up therapy are one component of a multi-disciplinary discharge planning process, led by the attending physician.  Recommendations may be updated based on patient status, additional functional criteria and insurance authorization.  Follow up Recommendations Other (comment) (TBA)      Assistance Recommended at Discharge    Functional Status Assessment Patient has had a recent decline in their functional status and demonstrates the ability to make significant improvements in function in a reasonable and predictable amount of time.  Frequency and Duration min 2x/week  1 week       Prognosis Prognosis for improved oropharyngeal function: Good Barriers to Reach Goals: Severity of deficits      Swallow Study   General HPI: [name] [name] is a 63 yo male presenting to ED 4/22 with worsening SOB on home O2 settings. CT Chest negative for PE but showed RLL PNA with background of advanced emphysema in addition to an irregular 2.1 cm nodule in the lingula and a 10 mm nodule in the RUL. PMH includes COPD, chronic hypoxia (on 10L supplemental O2 at baseline), HTN, CKD, history of tobacco use Type of Study: Bedside Swallow Evaluation Previous Swallow Assessment: none in chart Diet Prior to this Study: Regular;Thin liquids (Level 0) Temperature Spikes Noted: No Respiratory Status: Nasal cannula History of Recent Intubation: No Behavior/Cognition: Alert;Cooperative;Pleasant mood Oral Cavity  Assessment: Within Functional Limits Oral Care Completed by SLP: No Oral Cavity - Dentition: Missing dentition (one lower tooth) Vision: Functional for self-feeding Self-Feeding Abilities: Able to feed self Patient Positioning: Upright in bed Baseline Vocal Quality: Normal Volitional Cough: Congested Volitional Swallow:  Able to elicit    Oral/Motor/Sensory Function Overall Oral Motor/Sensory Function: Within functional limits   Ice Chips Ice chips: Not tested   Thin Liquid Thin Liquid: Impaired Presentation: Straw Pharyngeal  Phase Impairments: Throat Clearing - Delayed    Nectar Thick Nectar Thick Liquid: Not tested   Honey Thick Honey Thick Liquid: Not tested   Puree Puree: Not tested   Solid     Solid: Within functional limits Presentation: Self Fed      Amil Kale, M.A., CCC-SLP Speech Language Pathology, Acute Rehabilitation Services  Secure Chat preferred [phone]  08/02/2023,2:07 PM

## 2023-08-02 NOTE — Progress Notes (Signed)
 Progress Note    [name] [name]   ZOX:[id number]  DOB: [birth year]  DOA: 07/31/2023     2 PCP: [name] Mulberry, MD  Initial CC: worsening hypoxia  Hospital Course: [name] is a 63 yo male with PMH COPD, chronic hypoxia (on 10L O2 at home), HTN, pulmonary HTN, CKD, history of tobacco use who presented with worsening shortness of breath and worsening hypoxia on home oxygen  settings. CT angio chest was negative for PE and showed right lower lobe pneumonia with background of advanced emphysema.  Also new lung nodules noted.  He was started on antibiotics and admitted for further oxygen  weaning.  Subjective:   Patient in bed, appears comfortable, denies any headache, no fever, no chest pain or pressure, improved shortness of breath , no abdominal pain. No new focal weakness.   Assessment and Plan:   Acute on chronic respiratory failure with hypoxia (HCC) due to combination of pneumonia possible community-acquired along with mild COPD exacerbation, chronically on 8 to 10 L nasal cannula oxygen  at home.  - On 10 L oxygen  at baseline at home and requiring up to 15 L salter high flow in the ER, has been placed on Emperic ABX, steroids, supplemental oxygen  along with I-S and flutter valve for pulmonary toiletry, encouraged to sit up in chair in the daytime, increase activity wean down oxygen .  Will consult speech therapy to rule out any occult aspiration along with pulmonary as well, appears to have second set admission in the last few weeks.  Pulmonary nodules  - per CTA chest: ". Irregular 2.1 cm nodule in the lingula and 10 mm nodule in the right upper lobe.  Will involve pulmonary question bronchoscopy with biopsy.  Question lymphadenopathy causing some element of obstruction.  Hypertension  - Continue amlodipine   CKD (chronic kidney disease) stage 2, GFR 60-89 ml/min  - patient has history of CKD2. Baseline creat ~ 1.1 - 1.2  Tobacco abuse, in remission  - He endorses no further tobacco use in  years,  DC'd nicotine  patch    Antimicrobials: Azithromycin  07/31/2023 >> current Rocephin  07/31/2023 >> current  DVT prophylaxis:  heparin  injection 5,000 Units Start: 07/31/23 1615   Code Status:   Code Status: Full Code  Barriers to discharge: medical workup Disposition Plan:  Home HH orders placed: n/a Consultation.  Pulmonary critical care.    Status is: Inpt  Objective: Blood pressure 127/85, pulse 78, temperature 98.7 F (37.1 C), temperature source Axillary, resp. rate (!) 21, height 5\' 6"  (1.676 m), weight 84 kg, SpO2 96%.   Examination:   Awake Alert, No new F.N deficits, Normal affect [place].AT,PERRAL Supple Neck, No JVD,   Symmetrical Chest wall movement, Mod air movement bilaterally, mild wheezing RRR,No Gallops, Rubs or new Murmurs,  +ve B.Sounds, Abd Soft, No tenderness,   No Cyanosis, Clubbing or edema     Data Review:   Inpatient Medications  Scheduled Meds:  albuterol   2.5 mg Nebulization BID   amLODipine   10 mg Oral Daily   azithromycin   500 mg Oral Daily   cyanocobalamin   500 mcg Oral Daily   ezetimibe   10 mg Oral Daily   guaiFENesin   600 mg Oral BID   heparin   5,000 Units Subcutaneous Q8H   mometasone -formoterol   2 puff Inhalation BID   pantoprazole   40 mg Oral BID   predniSONE   40 mg Oral Q breakfast   thiamine   100 mg Oral Daily   Continuous Infusions:  cefTRIAXone  (ROCEPHIN )  IV 2 g (08/01/23 1547)  PRN Meds:.acetaminophen  **OR** acetaminophen , albuterol , benzonatate , hydrALAZINE , sodium chloride  flush, traMADol   DVT Prophylaxis  heparin  injection 5,000 Units Start: 07/31/23 1615  Recent Labs  Lab 07/31/23 1200 07/31/23 1212 08/02/23 0454  WBC 14.0*  --  18.6*  HGB 13.1 16.0 11.2*  HCT 44.2 47.0 36.6*  PLT 343  --  316  MCV 78.8*  --  75.2*  MCH 23.4*  --  23.0*  MCHC 29.6*  --  30.6  RDW 13.8  --  13.5  LYMPHSABS 3.3  --  2.1  MONOABS 1.7*  --  1.7*  EOSABS 0.2  --  0.0  BASOSABS 0.1  --  0.0    Recent Labs  Lab  07/31/23 1200 07/31/23 1212 07/31/23 1450 07/31/23 1840 08/01/23 0500 08/02/23 0454 08/02/23 0523  NA 137 137  --   --   --  138  --   K 3.7 3.7  --   --   --  4.1  --   CL 96*  --   --   --   --  98  --   CO2 30  --   --   --   --  31  --   ANIONGAP 11  --   --   --   --  9  --   GLUCOSE 130*  --   --   --   --  89  --   BUN 10  --   --   --   --  16  --   CREATININE 1.16  --   --   --   --  1.17  --   CRP  --   --   --   --   --   --  1.3*  PROCALCITON <0.10  --   --   --   --   --  0.44  LATICACIDVEN  --   --  3.6* 3.7*  --   --   --   TSH 2.632  --   --   --   --   --   --   BNP 32.5  --   --   --  58.2  --   --   MG  --   --   --   --   --  2.2  --   CALCIUM  9.3  --   --   --   --  9.2  --       Recent Labs  Lab 07/31/23 1200 07/31/23 1450 07/31/23 1840 08/01/23 0500 08/02/23 0454 08/02/23 0523  CRP  --   --   --   --   --  1.3*  PROCALCITON <0.10  --   --   --   --  0.44  LATICACIDVEN  --  3.6* 3.7*  --   --   --   TSH 2.632  --   --   --   --   --   BNP 32.5  --   --  58.2  --   --   MG  --   --   --   --  2.2  --   CALCIUM  9.3  --   --   --  9.2  --     --------------------------------------------------------------------------------------------------------------- Lab Results  Component Value Date   CHOL 222 (H) 06/08/2020   HDL 48 06/08/2020   LDLCALC 161 (H) 06/08/2020   TRIG 76 06/08/2020   CHOLHDL 4.6 06/08/2020  Lab Results  Component Value Date   HGBA1C 5.0 03/14/2022   Recent Labs    07/31/23 1200  TSH 2.632  FREET4 0.89      Micro Results Recent Results (from the past 240 hours)  Resp panel by RT-PCR (RSV, Flu A&B, Covid) Anterior Nasal Swab     Status: None   Collection Time: 07/31/23 12:00 PM   Specimen: Anterior Nasal Swab  Result Value Ref Range Status   SARS Coronavirus 2 by RT PCR NEGATIVE NEGATIVE Final   Influenza A by PCR NEGATIVE NEGATIVE Final   Influenza B by PCR NEGATIVE NEGATIVE Final    Comment: (NOTE) The  Xpert Xpress SARS-CoV-2/FLU/RSV plus assay is intended as an aid in the diagnosis of influenza from Nasopharyngeal swab specimens and should not be used as a sole basis for treatment. Nasal washings and aspirates are unacceptable for Xpert Xpress SARS-CoV-2/FLU/RSV testing.  Fact Sheet for Patients: BloggerCourse.com  Fact Sheet for Healthcare Providers: SeriousBroker.it  This test is not yet approved or cleared by the [place]  FDA and has been authorized for detection and/or diagnosis of SARS-CoV-2 by FDA under an Emergency Use Authorization (EUA). This EUA will remain in effect (meaning this test can be used) for the duration of the COVID-19 declaration under Section 564(b)(1) of the Act, 21 U.S.C. section 360bbb-3(b)(1), unless the authorization is terminated or revoked.     Resp Syncytial Virus by PCR NEGATIVE NEGATIVE Final    Comment: (NOTE) Fact Sheet for Patients: BloggerCourse.com  Fact Sheet for Healthcare Providers: SeriousBroker.it  This test is not yet approved or cleared by the [place]  FDA and has been authorized for detection and/or diagnosis of SARS-CoV-2 by FDA under an Emergency Use Authorization (EUA). This EUA will remain in effect (meaning this test can be used) for the duration of the COVID-19 declaration under Section 564(b)(1) of the Act, 21 U.S.C. section 360bbb-3(b)(1), unless the authorization is terminated or revoked.  Performed at [facility], [address] [place], [place] [address]     Radiology Reports  CT Angio Chest Pulmonary Embolism (PE) W or WO Contrast Result Date: 07/31/2023 CLINICAL DATA:  Respiratory distress EXAM: CT ANGIOGRAPHY CHEST WITH CONTRAST TECHNIQUE: Multidetector CT imaging of the chest was performed using the standard protocol during bolus administration of intravenous contrast. Multiplanar CT image  reconstructions and MIPs were obtained to evaluate the vascular anatomy. RADIATION DOSE REDUCTION: This exam was performed according to the departmental dose-optimization program which includes automated exposure control, adjustment of the mA and/or kV according to patient size and/or use of iterative reconstruction technique. CONTRAST:  75mL OMNIPAQUE  IOHEXOL  350 MG/ML SOLN COMPARISON:  Radiograph 07/31/2023 and CT chest 10/30/2018 FINDINGS: Cardiovascular: No pericardial effusion. Normal caliber thoracic aorta. Coronary artery atherosclerotic calcification. Negative for acute pulmonary embolism. The right main pulmonary artery measures 29 mm and the left measures 31 mm. This can be seen with pulmonary hypertension. Mediastinum/Nodes: Trachea and esophagus are unremarkable. Mediastinal and hilar lymphadenopathy is new since 10/30/2018. For example 1.4 cm right hilar node on series 5/image 106; 2.1 cm left hilar node on series 5/image 95; and 1.2 cm left paratracheal node on 5/62. Lungs/Pleura: Advanced emphysema. Increased ground-glass opacities and interlobular septal thickening in the lower lungs compared to 10/30/2018. New peripheral consolidation in the posterior right lower lobe. No pleural effusion or pneumothorax. 10 mm nodule in the right upper lobe (series 6/image 54). Irregular 2.1 x 1.8 cm nodule in the lingula (6/97). Upper Abdomen: Stable thickening in the left  adrenal gland. Unchanged hypoattenuation along the falciform ligament likely due to focal fatty infiltration. No acute abnormality. Musculoskeletal: No acute fracture or destructive osseous lesion. Review of the MIP images confirms the above findings. IMPRESSION: 1. Negative for acute pulmonary embolism. 2. Lower lung pneumonia or edema superimposed on a background of advanced emphysema. 3. Irregular 2.1 cm nodule in the lingula and 10 mm nodule in the right upper lobe. Given associated mediastinal and hilar lymphadenopathy these are concerning  for malignancy. PET/CT or tissue sampling is recommended. This recommendation follows the consensus statement: Guidelines for Management of Incidental Pulmonary Nodules Detected on CT Images: From the Fleischner Society 2017; Radiology 2017; 284:228-243. 4. Dilated pulmonary arteries compatible with pulmonary hypertension. Aortic Atherosclerosis (ICD10-I70.0) and Emphysema (ICD10-J43.9). Electronically Signed   By: Rozell Cornet M.D.   On: 07/31/2023 18:34   DG Chest Port 1 View Result Date: 07/31/2023 CLINICAL DATA:  Shortness of breath EXAM: PORTABLE CHEST 1 VIEW COMPARISON:  June 13, 2023 FINDINGS: Chronic interstitial lung disease with bilateral basilar interstitial infiltrates likely consistent with chronic interstitial lung changes and COPD of both upper lobes without evidence of acute infiltrates consolidations or pulmonary edema Heart and mediastinum normal No pleural effusions IMPRESSION: No acute cardiopulmonary disease. Electronically Signed   By: Fredrich Jefferson M.D.   On: 07/31/2023 13:56      Signature  -   Lynnwood Sauer M.D on 08/02/2023 at 7:23 AM   -  To page go to [email or web address]

## 2023-08-02 NOTE — Progress Notes (Signed)
 Initial Nutrition Assessment  DOCUMENTATION CODES:  Not applicable  INTERVENTION:  Liberalize to regular diet to provide wider variety of menu options in setting of increased nutritional needs Ensure Enlive po BID, each supplement provides 350 kcal and 20 grams of protein. MVI with minerals daily  NUTRITION DIAGNOSIS:  Increased nutrient needs related to acute illness, chronic illness (COPD) as evidenced by estimated needs.  GOAL:  Patient will meet greater than or equal to 90% of their needs  MONITOR:  PO intake, Supplement acceptance, Labs, Weight trends, Diet advancement  REASON FOR ASSESSMENT:  Consult Other (Comment) (nutrition goals)  ASSESSMENT:  Pt admitted with acute on chronic respiratory failure with hypoxia. PMH significant for COPD, chronic hypoxia, HTN, pulmonary HTN, CKD, tobacco use.   Found to have RLL PNA on admission, in addition to new lung nodules on CT chest.   Attempted to speak with pt at bedside. Nutrition history limited as pt on phone with doctor's office. He mentions that he lives with his daughter. On the days that she works, he typically eats twice daily. When she is off work, he may eat a little more. He states that his appetite has always been good and has never been an issue.   Meal completions: 4/23: 100% lunch  There is limited documentation of weights on file to review within the last year however it appears that pt's weight has been declining over the last 3 years. If documented weights are accurate, pt has had a weight loss of 8.9% between 02/28/23-07/11/23 which is a concerning amount for time frame.   Medications: zithromax , Vitamin B12, protonix , prednisone , thiamine , IV abx   Labs: reviewed  NUTRITION - FOCUSED PHYSICAL EXAM: Deferred to follow up as pt on phone call. Suspect pt with a degree of malnutrition however unable to confirm at this time based on limited information.   Diet Order:   Diet Order             Diet heart  healthy/carb modified Room service appropriate? Yes; Fluid consistency: Thin  Diet effective now                   EDUCATION NEEDS:   Not appropriate for education at this time  Skin:  Skin Assessment: Reviewed RN Assessment  Last BM:  4/22  Height:   Ht Readings from Last 1 Encounters:  07/31/23 5\' 6"  (1.676 m)    Weight:   Wt Readings from Last 1 Encounters:  07/31/23 84 kg   BMI:  Body mass index is 29.89 kg/m.  Estimated Nutritional Needs:   Kcal:  2000-2200  Protein:  100-115g  Fluid:  >/=2L  [name] [name], RDN, LDN Clinical Nutrition See AMiON for contact information.
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
## 2023-08-02 NOTE — Progress Notes (Signed)
 Pt placed on bipap prior to RT arrival for shortness of breath/increased work of breathing. Pt tolerating well upon arrival. PRN albuterol  treatment given at this time.   08/02/23 1721  Vent Select  Invasive or Noninvasive Noninvasive  Adult Vent Y  Adult Ventilator Settings  Vent Type Servo-air  Vent Mode BIPAP;PCV  Set Rate 14 bmp  FiO2 (%) (S)  50 %  I Time 0.9 Sec(s)  Pressure Control 7 cmH20  PEEP 5 cmH20  Adult Ventilator Measurements  Peak Airway Pressure 11 L/min  Mean Airway Pressure 7 cmH20  Resp Rate Spontaneous 12 br/min  Resp Rate Total 24 br/min  Exhaled Vt 1224 mL  Spont TV 1030 mL  Measured Ve 22.1 L  I:E Ratio Measured 1:2.4  Auto PEEP 0 cmH20  Total PEEP 5 cmH20  Adult Ventilator Alarms  Alarms On Y  Ve High Alarm 27 L/min  Ve Low Alarm 5 L/min  Resp Rate High Alarm 40 br/min  Resp Rate Low Alarm 10  PEEP Low Alarm 3 cmH2O  Press High Alarm 25 cmH2O  T Apnea 20 sec(s)  VAP Prevention  HOB> 30 Degrees Y  Breath Sounds  Bilateral Breath Sounds Diminished  Vent Respiratory Assessment  Patient Effort Good  Level of Consciousness Alert  Respiratory Pattern Regular;Accessory muscle use;Symmetrical  Patient Tolerance Tolerated well  Suction Method  Respiratory Interventions Airway suction;Oral suction

## 2023-08-02 NOTE — Progress Notes (Signed)
 Pt taken off bipap at this time without complication. Pt placed on 12L salter.

## 2023-08-02 NOTE — Progress Notes (Signed)
 Pt taken off bipap at this time per pt request. Pt endorses he feels much better and would like to try and eat. Pt placed on 12L HFNC at this time without complication.

## 2023-08-02 NOTE — Telephone Encounter (Signed)
 Needs posthospital follow-up visit in 4 to 6-week with Dr. Ainsley Alfred. COPD/advanced emphysema on 10 L oxygen . New finding of nodules with mediastinal lymphadenopathy and right lower lobe pneumonia -will need follow-up CT chest with contrast in 3 months that can be arranged on follow-up office visit

## 2023-08-02 NOTE — Plan of Care (Signed)
 Pt has rested quietly throughout the night with no distress noted. Alert and oriented. On Bipap during night with no difficulty. Otherwise O2 12LHFNC. SR on the monitor. Voids per urinal. No complaints voiced.     Problem: Education: Goal: Knowledge of General Education information will improve Description: Including pain rating scale, medication(s)/side effects and non-pharmacologic comfort measures Outcome: Progressing   Problem: Clinical Measurements: Goal: Respiratory complications will improve Outcome: Progressing Goal: Cardiovascular complication will be avoided Outcome: Progressing   Problem: Coping: Goal: Level of anxiety will decrease Outcome: Progressing   Problem: Pain Managment: Goal: General experience of comfort will improve and/or be controlled Outcome: Progressing

## 2023-08-02 NOTE — Telephone Encounter (Signed)
 Copied from CRM [id number]. Topic: Appointments - Appointment Cancel/Reschedule >> Aug 02, 2023 10:19 AM Evie Hoff wrote: Patient/patient representative is calling to cancel or reschedule an appointment. Refer to attachments for appointment information. Patient is calling from the hospital to cancel appointment . Patient is still in hospital . Patient says the hospital is trying to reach Dr Waylan Haggard to get him to come to hospital   Spoke to patient and offered HFU for 09/19/2023. I 'm unable to schedule in epic, I received an error that it can not be scheduled until 6/6.  Toy Freund, can you override and schedule this?

## 2023-08-03 ENCOUNTER — Inpatient Hospital Stay: Admitting: Primary Care

## 2023-08-03 ENCOUNTER — Inpatient Hospital Stay (HOSPITAL_COMMUNITY)

## 2023-08-03 DIAGNOSIS — J9621 Acute and chronic respiratory failure with hypoxia: Secondary | ICD-10-CM | POA: Diagnosis not present

## 2023-08-03 DIAGNOSIS — Z7189 Other specified counseling: Secondary | ICD-10-CM | POA: Diagnosis not present

## 2023-08-03 DIAGNOSIS — Z515 Encounter for palliative care: Secondary | ICD-10-CM | POA: Diagnosis not present

## 2023-08-03 LAB — CBC WITH DIFFERENTIAL/PLATELET
Abs Immature Granulocytes: 0.09 10*3/uL — ABNORMAL HIGH (ref 0.00–0.07)
Basophils Absolute: 0 10*3/uL (ref 0.0–0.1)
Basophils Relative: 0 %
Eosinophils Absolute: 0 10*3/uL (ref 0.0–0.5)
Eosinophils Relative: 0 %
HCT: 38.1 % — ABNORMAL LOW (ref 39.0–52.0)
Hemoglobin: 11.8 g/dL — ABNORMAL LOW (ref 13.0–17.0)
Immature Granulocytes: 1 %
Lymphocytes Relative: 15 %
Lymphs Abs: 2.6 10*3/uL (ref 0.7–4.0)
MCH: 23.1 pg — ABNORMAL LOW (ref 26.0–34.0)
MCHC: 31 g/dL (ref 30.0–36.0)
MCV: 74.7 fL — ABNORMAL LOW (ref 80.0–100.0)
Monocytes Absolute: 1.7 10*3/uL — ABNORMAL HIGH (ref 0.1–1.0)
Monocytes Relative: 10 %
Neutro Abs: 12.3 10*3/uL — ABNORMAL HIGH (ref 1.7–7.7)
Neutrophils Relative %: 74 %
Platelets: 324 10*3/uL (ref 150–400)
RBC: 5.1 MIL/uL (ref 4.22–5.81)
RDW: 13.7 % (ref 11.5–15.5)
WBC: 16.7 10*3/uL — ABNORMAL HIGH (ref 4.0–10.5)
nRBC: 0 % (ref 0.0–0.2)

## 2023-08-03 LAB — BASIC METABOLIC PANEL WITH GFR
Anion gap: 11 (ref 5–15)
BUN: 17 mg/dL (ref 8–23)
CO2: 29 mmol/L (ref 22–32)
Calcium: 9.3 mg/dL (ref 8.9–10.3)
Chloride: 96 mmol/L — ABNORMAL LOW (ref 98–111)
Creatinine, Ser: 1.06 mg/dL (ref 0.61–1.24)
GFR, Estimated: >60 mL/min (ref >60)
Glucose, Bld: 76 mg/dL (ref 70–99)
Potassium: 3.9 mmol/L (ref 3.5–5.1)
Sodium: 136 mmol/L (ref 135–145)

## 2023-08-03 LAB — PROCALCITONIN: Procalcitonin: 0.18 ng/mL

## 2023-08-03 LAB — BRAIN NATRIURETIC PEPTIDE: B Natriuretic Peptide: 53.2 pg/mL (ref 0.0–100.0)

## 2023-08-03 LAB — MAGNESIUM: Magnesium: 2.2 mg/dL (ref 1.7–2.4)

## 2023-08-03 LAB — C-REACTIVE PROTEIN: CRP: 0.8 mg/dL (ref <1.0)

## 2023-08-03 NOTE — Progress Notes (Signed)
 Progress Note    [name] [name]   GLO:[id number]  DOB: 06/25/1960  DOA: 07/31/2023     3 PCP: Joaquin Mulberry, MD  Initial CC: worsening hypoxia  Hospital Course: [name] is a 63 yo male with PMH COPD, chronic hypoxia (on 10L O2 at home), HTN, pulmonary HTN, CKD, history of tobacco use who presented with worsening shortness of breath and worsening hypoxia on home oxygen  settings. CT angio chest was negative for PE and showed right lower lobe pneumonia with background of advanced emphysema.  Also new lung nodules noted.  He was started on antibiotics and admitted for further oxygen  weaning.  Subjective:   Patient in bed, appears comfortable, denies any headache, no fever, no chest pain or pressure, no shortness of breath , no abdominal pain. No new focal weakness.    Assessment and Plan:   Acute on chronic respiratory failure with hypoxia (HCC) due to combination of pneumonia possible community-acquired along with mild COPD exacerbation, chronically on 10 L nasal cannula oxygen  at home.  - On 10 L oxygen  at baseline at home and requiring up to 15 L salter high flow in the ER, has been placed on Emperic ABX, steroids, supplemental oxygen  along with I-S and flutter valve for pulmonary toiletry, encouraged to sit up in chair in the daytime, increase activity wean down oxygen .  Will consult speech therapy to rule out any occult aspiration along with pulmonary as well, seen by pulmonary recommended to do steroid taper over 2 weeks, outpatient pulmonary follow-up for pulmonary nodule evaluation.  Address goals of care would involve palliative care, poor long-term prognosis.  Pulmonary nodules  - per CTA chest: ". Irregular 2.1 cm nodule in the lingula and 10 mm nodule in the right upper lobe.  In by pulmonary want to follow in the outpatient setting.  Follow-up with Dr. Waylan Haggard postdischarge.  Hypertension  - Continue amlodipine   CKD (chronic kidney disease) stage 2, GFR 60-89 ml/min  - patient  has history of CKD2. Baseline creat ~ 1.1 - 1.2  Tobacco abuse, in remission  - He endorses no further tobacco use in years,  DC'd nicotine  patch    Antimicrobials: Azithromycin  07/31/2023 >> current Rocephin  07/31/2023 >> current  DVT prophylaxis:  heparin  injection 5,000 Units Start: 07/31/23 1615   Code Status:   Code Status: Full Code  Barriers to discharge: medical workup Disposition Plan:  Home HH orders placed: n/a Consultation.  Pulmonary critical care.  Palliative care.  Status is: Inpt  Objective: Blood pressure 118/85, pulse 89, temperature 98 F (36.7 C), temperature source Oral, resp. rate 16, height 5\' 6"  (1.676 m), weight 84 kg, SpO2 97%.   Examination:   Awake Alert, No new F.N deficits, Normal affect .AT,PERRAL Supple Neck, No JVD,   Symmetrical Chest wall movement, Mod air movement bilaterally, mild wheezing RRR,No Gallops, Rubs or new Murmurs,  +ve B.Sounds, Abd Soft, No tenderness,   No Cyanosis, Clubbing or edema     Data Review:   Inpatient Medications  Scheduled Meds:  albuterol   2.5 mg Nebulization BID   amLODipine   10 mg Oral Daily   azithromycin   500 mg Oral Daily   cyanocobalamin   500 mcg Oral Daily   ezetimibe   10 mg Oral Daily   feeding supplement  237 mL Oral BID BM   guaiFENesin   600 mg Oral BID   heparin   5,000 Units Subcutaneous Q8H   mometasone -formoterol   2 puff Inhalation BID   multivitamin with minerals  1 tablet Oral Daily  pantoprazole   40 mg Oral BID   predniSONE   40 mg Oral Q breakfast   thiamine   100 mg Oral Daily   tiotropium  18 mcg Inhalation Daily   Continuous Infusions:  cefTRIAXone  (ROCEPHIN )  IV 2 g (08/02/23 1520)   PRN Meds:.acetaminophen  **OR** acetaminophen , albuterol , benzonatate , hydrALAZINE , sodium chloride  flush, traMADol   DVT Prophylaxis  heparin  injection 5,000 Units Start: 07/31/23 1615  Recent Labs  Lab 07/31/23 1200 07/31/23 1212 08/02/23 0454 08/03/23 0442  WBC 14.0*  --  18.6*  16.7*  HGB 13.1 16.0 11.2* 11.8*  HCT 44.2 47.0 36.6* 38.1*  PLT 343  --  316 324  MCV 78.8*  --  75.2* 74.7*  MCH 23.4*  --  23.0* 23.1*  MCHC 29.6*  --  30.6 31.0  RDW 13.8  --  13.5 13.7  LYMPHSABS 3.3  --  2.1 2.6  MONOABS 1.7*  --  1.7* 1.7*  EOSABS 0.2  --  0.0 0.0  BASOSABS 0.1  --  0.0 0.0    Recent Labs  Lab 07/31/23 1200 07/31/23 1212 07/31/23 1450 07/31/23 1840 08/01/23 0500 08/02/23 0454 08/02/23 0523 08/03/23 0442  NA 137 137  --   --   --  138  --  136  K 3.7 3.7  --   --   --  4.1  --  3.9  CL 96*  --   --   --   --  98  --  96*  CO2 30  --   --   --   --  31  --  29  ANIONGAP 11  --   --   --   --  9  --  11  GLUCOSE 130*  --   --   --   --  89  --  76  BUN 10  --   --   --   --  16  --  17  CREATININE 1.16  --   --   --   --  1.17  --  1.06  CRP  --   --   --   --   --   --  1.3* 0.8  PROCALCITON <0.10  --   --   --   --   --  0.44 0.18  LATICACIDVEN  --   --  3.6* 3.7*  --   --   --   --   TSH 2.632  --   --   --   --   --   --   --   BNP 32.5  --   --   --  58.2  --   --  53.2  MG  --   --   --   --   --  2.2  --  2.2  CALCIUM  9.3  --   --   --   --  9.2  --  9.3      Recent Labs  Lab 07/31/23 1200 07/31/23 1450 07/31/23 1840 08/01/23 0500 08/02/23 0454 08/02/23 0523 08/03/23 0442  CRP  --   --   --   --   --  1.3* 0.8  PROCALCITON <0.10  --   --   --   --  0.44 0.18  LATICACIDVEN  --  3.6* 3.7*  --   --   --   --   TSH 2.632  --   --   --   --   --   --  BNP 32.5  --   --  58.2  --   --  53.2  MG  --   --   --   --  2.2  --  2.2  CALCIUM  9.3  --   --   --  9.2  --  9.3    --------------------------------------------------------------------------------------------------------------- Lab Results  Component Value Date   CHOL 222 (H) 06/08/2020   HDL 48 06/08/2020   LDLCALC 161 (H) 06/08/2020   TRIG 76 06/08/2020   CHOLHDL 4.6 06/08/2020    Lab Results  Component Value Date   HGBA1C 5.0 03/14/2022   Recent Labs     07/31/23 1200  TSH 2.632  FREET4 0.89      Micro Results Recent Results (from the past 240 hours)  Resp panel by RT-PCR (RSV, Flu A&B, Covid) Anterior Nasal Swab     Status: None   Collection Time: 07/31/23 12:00 PM   Specimen: Anterior Nasal Swab  Result Value Ref Range Status   SARS Coronavirus 2 by RT PCR NEGATIVE NEGATIVE Final   Influenza A by PCR NEGATIVE NEGATIVE Final   Influenza B by PCR NEGATIVE NEGATIVE Final    Comment: (NOTE) The Xpert Xpress SARS-CoV-2/FLU/RSV plus assay is intended as an aid in the diagnosis of influenza from Nasopharyngeal swab specimens and should not be used as a sole basis for treatment. Nasal washings and aspirates are unacceptable for Xpert Xpress SARS-CoV-2/FLU/RSV testing.  Fact Sheet for Patients: BloggerCourse.com  Fact Sheet for Healthcare Providers: SeriousBroker.it  This test is not yet approved or cleared by the [place]  FDA and has been authorized for detection and/or diagnosis of SARS-CoV-2 by FDA under an Emergency Use Authorization (EUA). This EUA will remain in effect (meaning this test can be used) for the duration of the COVID-19 declaration under Section 564(b)(1) of the Act, 21 U.S.C. section 360bbb-3(b)(1), unless the authorization is terminated or revoked.     Resp Syncytial Virus by PCR NEGATIVE NEGATIVE Final    Comment: (NOTE) Fact Sheet for Patients: BloggerCourse.com  Fact Sheet for Healthcare Providers: SeriousBroker.it  This test is not yet approved or cleared by the [place]  FDA and has been authorized for detection and/or diagnosis of SARS-CoV-2 by FDA under an Emergency Use Authorization (EUA). This EUA will remain in effect (meaning this test can be used) for the duration of the COVID-19 declaration under Section 564(b)(1) of the Act, 21 U.S.C. section 360bbb-3(b)(1), unless the authorization  is terminated or revoked.  Performed at [facility], [address], [place], [place] [address]     Radiology Reports  No results found.     Signature  -   Lynnwood Sauer M.D on 08/03/2023 at 8:24 AM   -  To page go to [email or web address]

## 2023-08-03 NOTE — Progress Notes (Addendum)
   08/03/23 1442  TOC Brief Assessment  Insurance and Status Reviewed (Medicaid)  Patient has primary care physician Yes (Newlin, Enobong, MD)  Home environment has been reviewed From Home  Prior level of function: Independent  Prior/Current Home Services Current home services ([facility])  Social Drivers of Health Review SDOH reviewed needs interventions;SDOH reviewed no interventions necessary  Readmission risk has been reviewed Yes (11%)  Transition of care needs transition of care needs identified, TOC will continue to follow   Patient currently on 10L home O2. Palliative consult - patient is active with [facility].   Patient active with [facility] P[facility]. Will need new orders   Rollator ordered for patient from [employer]  TOC will continue to follow patient for any additional discharge needs

## 2023-08-03 NOTE — Plan of Care (Signed)

## 2023-08-03 NOTE — Progress Notes (Signed)
 Occupational Therapy Treatment Patient Details Name: [name] [name] MRN: [id number] DOB: 1960/07/04 Today's Date: 08/03/2023   History of present illness Pt is a 63 y/o male presenting on 4/22 with respiratory distress. CT angio negative for PE, but demonstrated lower lung PNA vs edema superimposed on advanced emphysema. PMH of COPD, CKD III, HTN, gout.   OT comments  Pt is fairly familiar to this OT, he presents with impaired activity tolerance and increased 02 demand with activity. Per RN pt transferring to [facility] with use of CPAP machine as that is his way to tolerate OOB at this time, elected to defer gait. Educated pt on use of respiratory device and performed EOB ADLs, pt desats with activity sitting EOB. OT to continue to progress pt as able.       If plan is discharge home, recommend the following:  A little help with walking and/or transfers;A little help with bathing/dressing/bathroom;Assistance with cooking/housework;Assist for transportation;Help with stairs or ramp for entrance   Equipment Recommendations  BSC/3in1    Recommendations for Other Services      Precautions / Restrictions Precautions Precautions: Fall Recall of Precautions/Restrictions: Intact Precaution/Restrictions Comments: watch O2 Restrictions Weight Bearing Restrictions Per Provider Order: No       Mobility Bed Mobility               General bed mobility comments: EOB upon entry and at exit    Transfers                         Balance Overall balance assessment: Needs assistance Sitting-balance support: No upper extremity supported, Feet supported Sitting balance-Leahy Scale: Good                                     ADL either performed or assessed with clinical judgement   ADL       Grooming: Sitting;Set up (lotion BLEs)   Upper Body Bathing: Sitting;Supervision/ safety (wash underneath arms)                             General ADL Comments:  Educated pt on the use of Ocapella device, pt pulling using his IS.    Extremity/Trunk Assessment              Vision       Restaurant manager, fast food Communication: No apparent difficulties   Cognition Arousal: Alert Behavior During Therapy: WFL for tasks assessed/performed Cognition: No apparent impairments                               Following commands: Intact        Cueing   Cueing Techniques: Verbal cues  Exercises General Exercises - Lower Extremity Long Arc Quad: AROM, Both, 10 reps, Seated    Shoulder Instructions       General Comments Pt on 12L HFNC, Sp02>91% at rest but with activity EOB pt desats to 87%. Per RN pt needing to be on CPAP machine to tolerate getting to [facility]- elected to defer OOB attempt today    Pertinent Vitals/ Pain       Pain Assessment Pain Assessment: No/denies pain  Home Living  Prior Functioning/Environment              Frequency  Min 2X/week        Progress Toward Goals  OT Goals(current goals can now be found in the care plan section)  Progress towards OT goals: Progressing toward goals  Acute Rehab OT Goals Patient Stated Goal: home when i'm better OT Goal Formulation: With patient Time For Goal Achievement: 08/15/23 Potential to Achieve Goals: Good  Plan      Co-evaluation                 AM-PAC OT "6 Clicks" Daily Activity     Outcome Measure   Help from another person eating meals?: None Help from another person taking care of personal grooming?: A Little Help from another person toileting, which includes using toliet, bedpan, or urinal?: A Little Help from another person bathing (including washing, rinsing, drying)?: A Little Help from another person to put on and taking off regular upper body clothing?: A Little Help from another person to put on and taking off regular lower body  clothing?: A Little 6 Click Score: 19    End of Session Equipment Utilized During Treatment: Oxygen   OT Visit Diagnosis: Other abnormalities of gait and mobility (R26.89);Other (comment)   Activity Tolerance Patient tolerated treatment well   Patient Left in bed;with call bell/phone within reach (EOB)   Nurse Communication Mobility status        Time: 0454-0981 OT Time Calculation (min): 17 min  Charges: OT General Charges $OT Visit: 1 Visit OT Treatments $Therapeutic Activity: 8-22 mins  08/03/2023  AB, OTR/L  Acute Rehabilitation Services  Office: [phone]   Jorene New 08/03/2023, 6:06 PM

## 2023-08-03 NOTE — Consult Note (Signed)
 Palliative Care Consult Note                                  Date: 08/03/2023   Patient Name: [name] [name]  DOB: 1960/11/11  MRN: [id number]  Age / Sex: 63 y.o., male  PCP: Joaquin Mulberry, MD Referring Physician: Cala Castleman, MD  Reason for Consultation: Establishing goals of care  HPI/Patient Profile: 63 y.o. male  with past medical history of COPD, chronic hypoxia (on 10L supplemental O2 at baseline), HTN, pulmonary HTN, CKD, history of tobacco use admitted on 07/31/2023 with worsening shortness of breath and worsening hypoxia on home oxygen  settings.   CT Chest negative for PE but showed RLL PNA with background of advanced emphysema in addition to an irregular 2.1 cm nodule in the lingula and a 10 mm nodule in the RUL. He was started on antibiotics and admitted for further oxygen  weaning.   Patient was seen by PMT during his last admission.  Past Medical History:  Diagnosis Date   Asthma 04/10/1997   CKD (chronic kidney disease), 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   COPD (chronic obstructive pulmonary disease) (HCC)    on 4L home O2   Essential hypertension    Gout    Thrombocytopenia (HCC) 07/28/2012    Subjective:   I have reviewed medical records including EPIC notes, labs and imaging, received update from nursing, assessed the patient and then met with the patient to discuss diagnosis prognosis, GOC, EOL wishes, disposition and options.  I introduced Palliative Medicine as specialized medical care for people living with serious illness. It focuses on providing relief from symptoms and stress of a serious illness. The goal is to improve quality of life for both the patient and the family.   Today's Discussion: Patient shared his understanding of his chronic diseases and acute hospitalization. I reviewed my understanding, including limitations of ongoing interventions, high risk for rehospitalization, and high risk for further decline  despite treatment efforts. He shared that he understands his COPD is worsening but that he is hoping for a miracle. We discussed hoping for the best case but preparing for the worst case scenario and he agrees that this is important.  A discussion was had today regarding advanced directives. We discussed code status. Last admission he met with PMT and changed his code status to DNR-- this admission he is listed as full code. He shared that he does not want CPR and does not want an attempted resuscitation. Code status changed to DNR. During his last hospitalization he changed his scope of care to limited-- stating he would not want intubation. We discussed that patients with his level of lung disease often do not do well intubated. He understands this but wants to discuss the subject with his children before making a decision-- He remains full scope of care. Offered for PMT to facilitate the discussion between him and his children regarding goals of care/scope of care, but he declined at this time. He does have outpatient palliative services with [facility].  Patient shared that he has a close family with three daughters and one son. He has spoken with them about how he would want his end of life to look. He also has 10 grandchildren and 4 great grandchildren. His wife passed away a few years ago after having many medical comorbidities. His family support him at home with IADL needs. He worked as a Curator. His family and his  faith are important to him.   Discussed the importance of continued conversation with family and the medical providers regarding overall plan of care and treatment options, ensuring decisions are within the context of the patient's values and GOCs.  Questions and concerns were addressed. he family was encouraged to call with questions or concerns. PMT will continue to support holistically.  Review of Systems  Constitutional:  Positive for fatigue.  Respiratory:  Positive for  shortness of breath.     Objective:   Primary Diagnoses: Present on Admission:  COPD exacerbation (HCC)  Hypertension  Moderate to severe pulmonary hypertension (HCC)  CKD (chronic kidney disease) stage 2, GFR 60-89 ml/min  Acute on chronic respiratory failure with hypoxia (HCC)  CAP (community acquired pneumonia)   Physical Exam Vitals reviewed.  Constitutional:      General: He is not in acute distress.    Interventions: Nasal cannula in place.  HENT:     Head: Normocephalic and atraumatic.  Cardiovascular:     Rate and Rhythm: Normal rate.  Pulmonary:     Effort: Pulmonary effort is normal.     Comments: Increased WOB with movement Skin:    General: Skin is warm and dry.  Neurological:     Mental Status: He is alert and oriented to person, place, and time.  Psychiatric:        Mood and Affect: Mood normal.        Behavior: Behavior normal.        Thought Content: Thought content normal.        Judgment: Judgment normal.     Vital Signs:  BP 118/85 (BP Location: Left Arm)   Pulse 89   Temp 98 F (36.7 C) (Oral)   Resp 16   Ht 5\' 6"  (1.676 m)   Wt 84 kg   SpO2 97%   BMI 29.89 kg/m   Palliative Assessment/Data: 50%    Advanced Care Planning:   Existing Vynca/ACP Documentation: DNR  Primary Decision Maker: PATIENT  Code Status/Advance Care Planning: DNR   Assessment & Plan:   SUMMARY OF RECOMMENDATIONS   Changed to DNR Continue full scope of care Encouraged patient to discuss goals of care with family- discuss possible DNI Continue outpatient palliative at discharge Continued PMT support   Discussed with: bedside RN and Dr. Zelda Hickman  Time Total: 75 minutes    Thank you for allowing us  to participate in the care of Zayed Griffie PMT will continue to support holistically.  Signed by: Joaquim Muir, NP Palliative Medicine Team  Team Phone # [phone] (Nights/Weekends)  08/03/2023, 3:13 PM

## 2023-08-03 NOTE — Telephone Encounter (Signed)
 Appointment was made

## 2023-08-03 NOTE — Progress Notes (Signed)
 Modified Barium Swallow Study  Patient Details  Name: [name] [name] MRN: [id number] Date of Birth: 1961/04/06  Today's Date: 08/03/2023  Modified Barium Swallow completed.  Full report located under Chart Review in the Imaging Section.  History of Present Illness Eual Lindstrom is a 63 yo male presenting to ED 4/22 with worsening SOB on home O2 settings. CT Chest negative for PE but showed RLL PNA with background of advanced emphysema in addition to an irregular 2.1 cm nodule in the lingula and a 10 mm nodule in the RUL. PMH includes COPD, chronic hypoxia (on 10L supplemental O2 at baseline), HTN, CKD, history of tobacco use   Clinical Impression Pt presents with mild oropharyngeal dysphagia characterized by mistiming. He is currently requiring 15LPM [place], which is increased from his baseline (10L). He consistently initiates a swallow response at the level of the pyriform sinuses, which allows superficial penetration of thin liquids that does not spontaneously clear (PAS 3). It does not progress to the level of the vocal folds and was eventually cleared with cued throat clearance and subswallows. There is no significant oropharyngeal residue. He masticates promptly and thoroughly despite only having one lower tooth. Provided education regarding using increased caution in times of acute respiratory decline. Recommend he continue his current diet without ongoing SLP f/u. Will sign off. Factors that may increase risk of adverse event in presence of aspiration Roderick Civatte & Jessy Morocco 2021): Respiratory or GI disease  Swallow Evaluation Recommendations Recommendations: PO diet PO Diet Recommendation: Regular;Thin liquids (Level 0) Liquid Administration via: Cup;Straw Medication Administration: Whole meds with liquid Supervision: Patient able to self-feed Swallowing strategies  : Minimize environmental distractions;Slow rate;Small bites/sips Postural changes: Position pt fully upright for meals Oral care  recommendations: Oral care BID (2x/day)    Amil Kale, M.A., CCC-SLP Speech Language Pathology, Acute Rehabilitation Services  Secure Chat preferred [phone]  08/03/2023,10:38 AM

## 2023-08-04 DIAGNOSIS — Z7189 Other specified counseling: Secondary | ICD-10-CM | POA: Diagnosis not present

## 2023-08-04 DIAGNOSIS — Z515 Encounter for palliative care: Secondary | ICD-10-CM | POA: Diagnosis not present

## 2023-08-04 DIAGNOSIS — J9621 Acute and chronic respiratory failure with hypoxia: Secondary | ICD-10-CM | POA: Diagnosis not present

## 2023-08-04 LAB — BASIC METABOLIC PANEL WITH GFR
Anion gap: 9 (ref 5–15)
BUN: 16 mg/dL (ref 8–23)
CO2: 32 mmol/L (ref 22–32)
Calcium: 9.2 mg/dL (ref 8.9–10.3)
Chloride: 97 mmol/L — ABNORMAL LOW (ref 98–111)
Creatinine, Ser: 1.14 mg/dL (ref 0.61–1.24)
GFR, Estimated: >60 mL/min (ref >60)
Glucose, Bld: 79 mg/dL (ref 70–99)
Potassium: 3.9 mmol/L (ref 3.5–5.1)
Sodium: 138 mmol/L (ref 135–145)

## 2023-08-04 LAB — CBC WITH DIFFERENTIAL/PLATELET
Abs Immature Granulocytes: 0.11 10*3/uL — ABNORMAL HIGH (ref 0.00–0.07)
Basophils Absolute: 0 10*3/uL (ref 0.0–0.1)
Basophils Relative: 0 %
Eosinophils Absolute: 0.1 10*3/uL (ref 0.0–0.5)
Eosinophils Relative: 0 %
HCT: 40.5 % (ref 39.0–52.0)
Hemoglobin: 12.3 g/dL — ABNORMAL LOW (ref 13.0–17.0)
Immature Granulocytes: 1 %
Lymphocytes Relative: 18 %
Lymphs Abs: 3.1 10*3/uL (ref 0.7–4.0)
MCH: 22.7 pg — ABNORMAL LOW (ref 26.0–34.0)
MCHC: 30.4 g/dL (ref 30.0–36.0)
MCV: 74.6 fL — ABNORMAL LOW (ref 80.0–100.0)
Monocytes Absolute: 2 10*3/uL — ABNORMAL HIGH (ref 0.1–1.0)
Monocytes Relative: 12 %
Neutro Abs: 11.5 10*3/uL — ABNORMAL HIGH (ref 1.7–7.7)
Neutrophils Relative %: 69 %
Platelets: 335 10*3/uL (ref 150–400)
RBC: 5.43 MIL/uL (ref 4.22–5.81)
RDW: 13.9 % (ref 11.5–15.5)
WBC: 16.7 10*3/uL — ABNORMAL HIGH (ref 4.0–10.5)
nRBC: 0 % (ref 0.0–0.2)

## 2023-08-04 LAB — PROCALCITONIN: Procalcitonin: <0.1 ng/mL

## 2023-08-04 LAB — BRAIN NATRIURETIC PEPTIDE: B Natriuretic Peptide: 32.5 pg/mL (ref 0.0–100.0)

## 2023-08-04 LAB — MAGNESIUM: Magnesium: 2.2 mg/dL (ref 1.7–2.4)

## 2023-08-04 LAB — C-REACTIVE PROTEIN: CRP: 0.6 mg/dL (ref <1.0)

## 2023-08-04 MED ORDER — LOPERAMIDE HCL 2 MG PO CAPS
4.0000 mg | ORAL_CAPSULE | Freq: Four times a day (QID) | ORAL | Status: DC | PRN
  Administered 2023-08-04 – 2023-08-07 (×3): 4 mg via ORAL
  Filled 2023-08-04 (×3): qty 2

## 2023-08-04 NOTE — Progress Notes (Signed)
 Daily Progress Note   Patient Name: [name] [name]       Date: 08/04/2023 DOB: May 02, 1960  Age: 63 y.o. MRN#: [id number] Attending Physician: Cala Castleman, MD Primary Care Physician: Joaquin Mulberry, MD Admit Date: 07/31/2023  Reason for Consultation/Follow-up: Establishing goals of care  Length of Stay: 4  Current Medications: Scheduled Meds:   albuterol   2.5 mg Nebulization BID   amLODipine   10 mg Oral Daily   azithromycin   500 mg Oral Daily   cyanocobalamin   500 mcg Oral Daily   ezetimibe   10 mg Oral Daily   feeding supplement  237 mL Oral BID BM   guaiFENesin   600 mg Oral BID   heparin   5,000 Units Subcutaneous Q8H   mometasone -formoterol   2 puff Inhalation BID   multivitamin with minerals  1 tablet Oral Daily   pantoprazole   40 mg Oral BID   predniSONE   40 mg Oral Q breakfast   thiamine   100 mg Oral Daily   tiotropium  18 mcg Inhalation Daily    Continuous Infusions:   PRN Meds: acetaminophen  **OR** acetaminophen , albuterol , benzonatate , hydrALAZINE , loperamide, sodium chloride  flush, traMADol   Physical Exam Vitals reviewed.  Constitutional:      General: He is not in acute distress.    Appearance: He is ill-appearing.  HENT:     Head: Normocephalic and atraumatic.  Cardiovascular:     Rate and Rhythm: Normal rate.  Pulmonary:     Effort: Tachypnea present.  Skin:    General: Skin is warm and dry.  Neurological:     Mental Status: He is alert and oriented to person, place, and time.  Psychiatric:        Mood and Affect: Mood normal.        Behavior: Behavior normal.        Thought Content: Thought content normal.        Judgment: Judgment normal.             Vital Signs: BP 123/88 (BP Location: Left Arm)   Pulse 92   Temp 98.2 F (36.8 C) (Oral)    Resp (!) 28   Ht 5\' 6"  (1.676 m)   Wt 84 kg   SpO2 92%   BMI 29.89 kg/m  SpO2: SpO2: 92 % O2 Device: O2 Device: High Flow Nasal Cannula O2  Flow Rate: O2 Flow Rate (L/min): 10 L/min       Palliative Assessment/Data: 50%      Patient Active Problem List   Diagnosis Date Noted   Pulmonary nodules 08/01/2023   Respiratory distress 07/31/2023   Viral respiratory infection 06/14/2023   Acute on chronic hypoxic respiratory failure (HCC) 06/13/2023   Acute on chronic respiratory failure with hypoxia (HCC) 05/10/2022   COVID-19 virus infection 05/10/2022   Statin intolerance 04/20/2021   Hyperlipidemia 04/19/2021   Acute hypoxemic respiratory failure due to COVID-19 [facility]) 12/04/2020   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   Chronic respiratory failure with hypoxia (HCC) 10/30/2016   Class 2 obesity 09/17/2015   Bilateral carpal tunnel syndrome 09/09/2015   Ulnar neuropathy of both upper extremities 09/09/2015   Numbness of left hand 01/21/2015   Abnormal EKG 10/27/2014   Moderate to severe pulmonary hypertension (HCC) 12/14/2013   COPD exacerbation (HCC) 12/11/2013   Cocaine abuse in remission (HCC) 12/11/2013   Hypertension 12/11/2013   COPD bronchitis 08/08/2012   Gout 08/08/2012   CKD (chronic kidney disease) stage 2, GFR 60-89 ml/min 08/03/2012   Tobacco abuse, 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 male  with past medical history of COPD, chronic hypoxia (on 10L supplemental O2 at baseline), HTN, pulmonary HTN, CKD, history of tobacco use admitted on 07/31/2023 with worsening shortness of breath and worsening hypoxia on home oxygen  settings.    CT Chest negative for PE but showed RLL PNA with background of advanced emphysema in addition to an irregular 2.1 cm nodule in the lingula and a 10 mm nodule in the RUL. He was started on antibiotics and admitted for further oxygen  weaning.     Patient was seen by PMT during his last admission.  Today's Discussion: Patient is sitting up in bed in NAD. He states he is doing well. His breathing is better than when he came in but worse than his previous baseline. He is hopeful his breathing will continue to improve. We discuss that this may be his new normal. He understands this and says he will make adjustments as needed to compensate. He asks about getting a bedside commode-- sent message to CM inquiring about this.  He has not spoken with his children about his goals of care but plans to. He understands outpatient palliative can also help with these discussions. We agree to follow up in a couple days.  Encouraged patient to call PMT with questions or concerns. PMT will continue to follow up.  Recommendations/Plan: DNR Continue full scope of care Encouraged patient to discuss goals of care with family- discuss possible DNI Continue outpatient palliative at discharge Continued PMT support   Code Status:    Code Status Orders  (From admission, onward)           Start     Ordered   08/03/23 1511  Do not attempt resuscitation (DNR) Pre-Arrest Interventions Desired  (Code Status)  Continuous       Question Answer Comment  If pulseless and not breathing No CPR or chest compressions.   In Pre-Arrest Conditions (Patient Has Pulse and Is Breathing) May intubate, use advanced airway interventions and cardioversion/ACLS medications if appropriate or indicated. May transfer to ICU.   Consent: Discussion documented in EHR or advanced directives reviewed      08/03/23 1510         Extensive chart review has been  completed prior to seeing the patient including labs, vital signs, imaging, progress/consult notes, orders, medications, and available advance directive documents.  Thank you for allowing the Palliative Medicine Team to assist in the care of this patient.  Time spent: 35 minutes  Daina Drum, NP  Please contact  Palliative Medicine Team phone at [phone] for questions and concerns.

## 2023-08-04 NOTE — Plan of Care (Signed)

## 2023-08-04 NOTE — Progress Notes (Signed)
 Progress Note    [name] [name]   ZOX:[id number]  DOB: 06/20/1960  DOA: 07/31/2023     4 PCP: Joaquin Mulberry, MD  Initial CC: worsening hypoxia  Hospital Course: [name] [name] is a 63 yo male with PMH COPD, chronic hypoxia (on 10L O2 at home), HTN, pulmonary HTN, CKD, history of tobacco use who presented with worsening shortness of breath and worsening hypoxia on home oxygen  settings. CT angio chest was negative for PE and showed right lower lobe pneumonia with background of advanced emphysema.  Also new lung nodules noted.  He was started on antibiotics and admitted for further oxygen  weaning.  Subjective:   Patient in bed, appears comfortable, denies any headache, no fever, no chest pain or pressure, improving shortness of breath , no abdominal pain. No new focal weakness.   Assessment and Plan:   Acute on chronic respiratory failure with hypoxia (HCC) due to combination of pneumonia possible community-acquired along with mild COPD exacerbation, chronically on 10 L nasal cannula oxygen  at home.  - On 10 L oxygen  at baseline at home and requiring up to 15 L salter high flow in the ER, has been placed on Emperic ABX, steroids, supplemental oxygen  along with I-S and flutter valve for pulmonary toiletry, encouraged to sit up in chair in the daytime, increase activity wean down oxygen .  Will consult speech therapy to rule out any occult aspiration along with pulmonary as well, seen by pulmonary recommended to do steroid taper over 2 weeks, outpatient pulmonary follow-up for pulmonary nodule evaluation.  Address goals of care would involve palliative care, poor long-term prognosis.  Pulmonary nodules  - per CTA chest: ". Irregular 2.1 cm nodule in the lingula and 10 mm nodule in the right upper lobe.  In by pulmonary want to follow in the outpatient setting.  Follow-up with Dr. Waylan Haggard postdischarge.  Hypertension  - Continue amlodipine   CKD (chronic kidney disease) stage 2, GFR 60-89 ml/min  -  patient has history of CKD2. Baseline creat ~ 1.1 - 1.2  Tobacco abuse, in remission  - He endorses no further tobacco use in years,  DC'd nicotine  patch    Antimicrobials: Azithromycin  07/31/2023 >> current Rocephin  07/31/2023 >> current  DVT prophylaxis:  heparin  injection 5,000 Units Start: 07/31/23 1615   Code Status:   Code Status: Do not attempt resuscitation (DNR) PRE-ARREST INTERVENTIONS DESIRED  Barriers to discharge: medical workup Disposition Plan:  Home HH orders placed: n/a Consultation.  Pulmonary critical care.  Palliative care.  Status is: Inpt  Objective: Blood pressure 118/87, pulse 93, temperature 98 F (36.7 C), temperature source Axillary, resp. rate (!) 22, height 5\' 6"  (1.676 m), weight 84 kg, SpO2 98%.   Examination:   Awake Alert, No new F.N deficits, Normal affect [place].AT,PERRAL Supple Neck, No JVD,   Symmetrical Chest wall movement, Mod air movement bilaterally, mild wheezing RRR,No Gallops, Rubs or new Murmurs,  +ve B.Sounds, Abd Soft, No tenderness,   No Cyanosis, Clubbing or edema     Data Review:   Inpatient Medications  Scheduled Meds:  albuterol   2.5 mg Nebulization BID   amLODipine   10 mg Oral Daily   azithromycin   500 mg Oral Daily   cyanocobalamin   500 mcg Oral Daily   ezetimibe   10 mg Oral Daily   feeding supplement  237 mL Oral BID BM   guaiFENesin   600 mg Oral BID   heparin   5,000 Units Subcutaneous Q8H   mometasone -formoterol   2 puff Inhalation BID   multivitamin with  minerals  1 tablet Oral Daily   pantoprazole   40 mg Oral BID   predniSONE   40 mg Oral Q breakfast   thiamine   100 mg Oral Daily   tiotropium  18 mcg Inhalation Daily   Continuous Infusions:  cefTRIAXone  (ROCEPHIN )  IV Stopped (08/03/23 2000)   PRN Meds:.acetaminophen  **OR** acetaminophen , albuterol , benzonatate , hydrALAZINE , sodium chloride  flush, traMADol   DVT Prophylaxis  heparin  injection 5,000 Units Start: 07/31/23 1615  Recent Labs  Lab  07/31/23 1200 07/31/23 1212 08/02/23 0454 08/03/23 0442 08/04/23 0442  WBC 14.0*  --  18.6* 16.7* 16.7*  HGB 13.1 16.0 11.2* 11.8* 12.3*  HCT 44.2 47.0 36.6* 38.1* 40.5  PLT 343  --  316 324 335  MCV 78.8*  --  75.2* 74.7* 74.6*  MCH 23.4*  --  23.0* 23.1* 22.7*  MCHC 29.6*  --  30.6 31.0 30.4  RDW 13.8  --  13.5 13.7 13.9  LYMPHSABS 3.3  --  2.1 2.6 3.1  MONOABS 1.7*  --  1.7* 1.7* 2.0*  EOSABS 0.2  --  0.0 0.0 0.1  BASOSABS 0.1  --  0.0 0.0 0.0    Recent Labs  Lab 07/31/23 1200 07/31/23 1212 07/31/23 1450 07/31/23 1840 08/01/23 0500 08/02/23 0454 08/02/23 0523 08/03/23 0442 08/04/23 0442  NA 137 137  --   --   --  138  --  136 138  K 3.7 3.7  --   --   --  4.1  --  3.9 3.9  CL 96*  --   --   --   --  98  --  96* 97*  CO2 30  --   --   --   --  31  --  29 32  ANIONGAP 11  --   --   --   --  9  --  11 9  GLUCOSE 130*  --   --   --   --  89  --  76 79  BUN 10  --   --   --   --  16  --  17 16  CREATININE 1.16  --   --   --   --  1.17  --  1.06 1.14  CRP  --   --   --   --   --   --  1.3* 0.8 0.6  PROCALCITON <0.10  --   --   --   --   --  0.44 0.18 <0.10  LATICACIDVEN  --   --  3.6* 3.7*  --   --   --   --   --   TSH 2.632  --   --   --   --   --   --   --   --   BNP 32.5  --   --   --  58.2  --   --  53.2 32.5  MG  --   --   --   --   --  2.2  --  2.2 2.2  CALCIUM  9.3  --   --   --   --  9.2  --  9.3 9.2      Recent Labs  Lab 07/31/23 1200 07/31/23 1450 07/31/23 1840 08/01/23 0500 08/02/23 0454 08/02/23 0523 08/03/23 0442 08/04/23 0442  CRP  --   --   --   --   --  1.3* 0.8 0.6  PROCALCITON <0.10  --   --   --   --  0.44 0.18 <0.10  LATICACIDVEN  --  3.6* 3.7*  --   --   --   --   --   TSH 2.632  --   --   --   --   --   --   --   BNP 32.5  --   --  58.2  --   --  53.2 32.5  MG  --   --   --   --  2.2  --  2.2 2.2  CALCIUM  9.3  --   --   --  9.2  --  9.3 9.2     --------------------------------------------------------------------------------------------------------------- Lab Results  Component Value Date   CHOL 222 (H) 06/08/2020   HDL 48 06/08/2020   LDLCALC 161 (H) 06/08/2020   TRIG 76 06/08/2020   CHOLHDL 4.6 06/08/2020    Lab Results  Component Value Date   HGBA1C 5.0 03/14/2022   No results for input(s): "TSH", "T4TOTAL", "FREET4", "T3FREE", "THYROIDAB" in the last 72 hours.     Micro Results Recent Results (from the past 240 hours)  Resp panel by RT-PCR (RSV, Flu A&B, Covid) Anterior Nasal Swab     Status: None   Collection Time: 07/31/23 12:00 PM   Specimen: Anterior Nasal Swab  Result Value Ref Range Status   SARS Coronavirus 2 by RT PCR NEGATIVE NEGATIVE Final   Influenza A by PCR NEGATIVE NEGATIVE Final   Influenza B by PCR NEGATIVE NEGATIVE Final    Comment: (NOTE) The Xpert Xpress SARS-CoV-2/FLU/RSV plus assay is intended as an aid in the diagnosis of influenza from Nasopharyngeal swab specimens and should not be used as a sole basis for treatment. Nasal washings and aspirates are unacceptable for Xpert Xpress SARS-CoV-2/FLU/RSV testing.  Fact Sheet for Patients: BloggerCourse.com  Fact Sheet for Healthcare Providers: SeriousBroker.it  This test is not yet approved or cleared by the [place]  FDA and has been authorized for detection and/or diagnosis of SARS-CoV-2 by FDA under an Emergency Use Authorization (EUA). This EUA will remain in effect (meaning this test can be used) for the duration of the COVID-19 declaration under Section 564(b)(1) of the Act, 21 U.S.C. section 360bbb-3(b)(1), unless the authorization is terminated or revoked.     Resp Syncytial Virus by PCR NEGATIVE NEGATIVE Final    Comment: (NOTE) Fact Sheet for Patients: BloggerCourse.com  Fact Sheet for Healthcare  Providers: SeriousBroker.it  This test is not yet approved or cleared by the [place]  FDA and has been authorized for detection and/or diagnosis of SARS-CoV-2 by FDA under an Emergency Use Authorization (EUA). This EUA will remain in effect (meaning this test can be used) for the duration of the COVID-19 declaration under Section 564(b)(1) of the Act, 21 U.S.C. section 360bbb-3(b)(1), unless the authorization is terminated or revoked.  Performed at [facility], [address] [place], [place] [address]     Radiology Reports  DG Swallowing [facility] Pathology Result Date: 08/03/2023 Table formatting from the original result was not included. Modified Barium Swallow Study Patient Details Name: Joncarlos Pullar MRN: [id number] Date of Birth: [birth year] Today's Date: 08/03/2023 HPI/PMH: HPI: Ithiel Dunkley is a 63 yo male presenting to ED 4/22 with worsening SOB on home O2 settings. CT Chest negative for PE but showed RLL PNA with background of advanced emphysema in addition to an irregular 2.1 cm nodule in the lingula and a 10 mm nodule in the RUL. PMH includes COPD, chronic hypoxia (on 10L supplemental O2 at baseline), HTN, CKD, history  of tobacco use Clinical Impression: Clinical Impression: Pt presents with mild oropharyngeal dysphagia characterized by mistiming. He is currently requiring 15LPM [place], which is increased from his baseline (10L). He consistently initiates a swallow response at the level of the pyriform sinuses, which allows superficial penetration of thin liquids that does not spontaneously clear (PAS 3). It does not progress to the level of the vocal folds and was eventually cleared with cued throat clearance and subswallows. There is no significant oropharyngeal residue. He masticates promptly and thoroughly despite only having one lower tooth. Pt took the 13 mm barium tablet with thin liquids, which was [facility]. Provided education regarding using increased  caution in times of acute respiratory decline. Recommend he continue his current diet without ongoing SLP f/u. Will sign off. Factors that may increase risk of adverse event in presence of aspiration Roderick Civatte & Jessy Morocco 2021): Factors that may increase risk of adverse event in presence of aspiration Roderick Civatte & Jessy Morocco 2021): Respiratory or GI disease Recommendations/Plan: Swallowing Evaluation Recommendations Swallowing Evaluation Recommendations Recommendations: PO diet PO Diet Recommendation: Regular; Thin liquids (Level 0) Liquid Administration via: Cup; Straw Medication Administration: Whole meds with liquid Supervision: Patient able to self-feed Swallowing strategies  : Minimize environmental distractions; Slow rate; Small bites/sips Postural changes: Position pt fully upright for meals Oral care recommendations: Oral care BID (2x/day) Treatment Plan Treatment Plan Treatment recommendations: No treatment recommended at this time Follow-up recommendations: No SLP follow up Functional status assessment: Patient has not had a recent decline in their functional status. Recommendations Recommendations for follow up therapy are one component of a multi-disciplinary discharge planning process, led by the attending physician.  Recommendations may be updated based on patient status, additional functional criteria and insurance authorization. Assessment: Orofacial Exam: Orofacial Exam Oral Cavity: Oral Hygiene: WFL Oral Cavity - Dentition: Missing dentition (one lower tooth) Orofacial Anatomy: WFL Oral Motor/Sensory Function: WFL Anatomy: Anatomy: Suspected cervical osteophytes Boluses Administered: Boluses Administered Boluses Administered: Thin liquids (Level 0); Mildly thick liquids (Level 2, nectar thick); Moderately thick liquids (Level 3, honey thick); Puree; Solid  Oral Impairment Domain: Oral Impairment Domain Lip Closure: No labial escape Tongue control during bolus hold: Cohesive bolus between tongue to palatal  seal Bolus preparation/mastication: Timely and efficient chewing and mashing Bolus transport/lingual motion: Brisk tongue motion Oral residue: Complete oral clearance Location of oral residue : N/A Initiation of pharyngeal swallow : Posterior laryngeal surface of the epiglottis  Pharyngeal Impairment Domain: Pharyngeal Impairment Domain Soft palate elevation: No bolus between soft palate (SP)/pharyngeal wall (PW) Laryngeal elevation: Complete superior movement of thyroid  cartilage with complete approximation of arytenoids to epiglottic petiole Anterior hyoid excursion: Complete anterior movement Epiglottic movement: Complete inversion Laryngeal vestibule closure: Complete, no air/contrast in laryngeal vestibule Pharyngeal stripping wave : Present - complete Pharyngeal contraction (A/P view only): N/A Pharyngoesophageal segment opening: Complete distension and complete duration, no obstruction of flow Tongue base retraction: No contrast between tongue base and posterior pharyngeal wall (PPW) Pharyngeal residue: Complete pharyngeal clearance Location of pharyngeal residue: N/A  Esophageal Impairment Domain: Esophageal Impairment Domain Esophageal clearance upright position: Complete clearance, esophageal coating Pill: Pill Consistency administered: Thin liquids (Level 0) Thin liquids (Level 0): [facility] Penetration/Aspiration Scale Score: Penetration/Aspiration Scale Score 1.  Material does not enter airway: Mildly thick liquids (Level 2, nectar thick); Moderately thick liquids (Level 3, honey thick); Puree; Solid; Pill 3.  Material enters airway, remains ABOVE vocal cords and not ejected out: Thin liquids (Level 0) Compensatory Strategies: Compensatory Strategies Compensatory strategies: Yes Straw: Effective Effective  Straw: Thin liquid (Level 0); Mildly thick liquid (Level 2, nectar thick)   General Information: Caregiver present: Yes  Diet Prior to this Study: Regular; Thin liquids (Level 0)   Temperature : Normal    Respiratory Status: WFL   Supplemental O2: Nasal cannula (15LPM)   History of Recent Intubation: No  Behavior/Cognition: Alert; Cooperative; Pleasant mood Self-Feeding Abilities: Able to self-feed Baseline vocal quality/speech: Normal Volitional Cough: Able to elicit Volitional Swallow: Able to elicit Exam Limitations: No limitations Goal Planning: Prognosis for improved oropharyngeal function: Good Barriers to Reach Goals: Time post onset No data recorded Patient/Family Stated Goal: none stated Consulted and agree with results and recommendations: Patient; Nurse Pain: Pain Assessment Pain Assessment: No/denies pain End of Session: Start Time:SLP Start Time (ACUTE ONLY): 0955 Stop Time: SLP Stop Time (ACUTE ONLY): 1007 Time Calculation:SLP Time Calculation (min) (ACUTE ONLY): 12 min Charges: SLP Evaluations $ SLP Speech Visit: 1 Visit SLP Evaluations $BSS Swallow: 1 Procedure $MBS Swallow: 1 Procedure SLP visit diagnosis: SLP Visit Diagnosis: Dysphagia, oropharyngeal phase (R13.12) Past Medical History: Past Medical History: Diagnosis Date  Asthma 04/10/1997  CKD (chronic kidney disease), 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  COPD (chronic obstructive pulmonary disease) (HCC)   on 4L home O2  Essential hypertension   Gout   Thrombocytopenia (HCC) 07/28/2012 Past Surgical History: Past Surgical History: Procedure Laterality Date  FRACTURE SURGERY  childhood  right arm  KNEE SURGERY    right knee s/p trauma Amil Kale, M.A., CCC-SLP Speech Language Pathology, Acute Rehabilitation Services Secure Chat preferred [phone] 08/03/2023, 10:40 AM      Signature  -   Lynnwood Sauer M.D on 08/04/2023 at 9:28 AM   -  To page go to [email or web address]

## 2023-08-04 NOTE — Plan of Care (Signed)
  Problem: Clinical Measurements: Goal: Ability to maintain clinical measurements within normal limits will improve Outcome: Progressing Goal: Will remain free from infection Outcome: Progressing Goal: Respiratory complications will improve Outcome: Progressing   Problem: Activity: Goal: Risk for activity intolerance will decrease Outcome: Progressing   Problem: Safety: Goal: Ability to remain free from injury will improve Outcome: Progressing

## 2023-08-04 NOTE — Progress Notes (Signed)
 Physical Therapy Treatment Patient Details Name: [name] [name] MRN: [id number] DOB: [birth year] Today's Date: 08/04/2023   History of Present Illness Pt is a 63 y/o male presenting on 4/22 with respiratory distress. CT angio negative for PE, but demonstrated lower lung PNA vs edema superimposed on advanced emphysema. PMH of COPD, CKD III, HTN, gout.    PT Comments  Pt greeted seated EOB, pleasant and agreeable to PT session. He performed all functional mobility with supervision for safety. He completed a total of 8 sit-to-stands without UE support. Pt desaturated to 79% SpO2 on 10L after the first three stands requiring bump up to 15L and prolonged seated rest of ~72mins to recover to >88% SpO2. On the following stands he desaturated between 83-86% SpO2 and was able to recover within ~21mins seated rest and bump up to 11L. Pt ambulated within the room using RW at a very slow and steady pace. He intermittently took standing rest breaks focusing on PLB to maintain SpO2 88-89%. Pt will continue to benefit from acute skilled PT to improve his activity tolerance and increase his independence and safety with mobility to allow discharge home.     If plan is discharge home, recommend the following: Assistance with cooking/housework;Assist for transportation;Help with stairs or ramp for entrance   Can travel by private vehicle        Equipment Recommendations  Rollator (4 wheels)    Recommendations for Other Services       Precautions / Restrictions Precautions Precautions: Fall Recall of Precautions/Restrictions: Intact Precaution/Restrictions Comments: watch SpO2 Restrictions Weight Bearing Restrictions Per Provider Order: No     Mobility  Bed Mobility               General bed mobility comments: Not assessed. Pt greeted seated EOB and returned there at end of session.    Transfers Overall transfer level: Needs assistance Equipment used: None, Rolling walker (2 wheels) Transfers:  Sit to/from Stand Sit to Stand: Supervision           General transfer comment: Pt stood from lowest bed height without UE support. He was able to complete 3 stands in a row prior to desaturating and requiring a prolonged seated rest to recover. Good eccentric control with sitting. Prior to initating gait, RW was placed infront of pt and he demonstrated proper hand placement and sequencing of AD.    Ambulation/Gait Ambulation/Gait assistance: Supervision Gait Distance (Feet): 25 Feet Assistive device: Rolling walker (2 wheels) Gait Pattern/deviations: Step-through pattern, Decreased stride length, Trunk flexed Gait velocity: decreased Gait velocity interpretation: <1.31 ft/sec, indicative of household ambulator   General Gait Details: Pt ambulated with a reciprocal gait pattern, even weight shift, and good foot clearence. He maintained a fwd flex posture with RW positioned slighlty too far infront of him. VC to correct positioning, which pt addressed, but reported dislking. He took increased time to perform task, taking intermittent standing rest breaks to focus on PLB.   Stairs             Wheelchair Mobility     Tilt Bed    Modified Rankin (Stroke Patients Only)       Balance Overall balance assessment: Needs assistance Sitting-balance support: No upper extremity supported, Feet supported Sitting balance-Leahy Scale: Good     Standing balance support: No upper extremity supported, Bilateral upper extremity supported, During functional activity Standing balance-Leahy Scale: Fair Standing balance comment: Pt could stand without and AD. He was dependent on RW for gait. No LOB.  Communication Communication Communication: No apparent difficulties  Cognition Arousal: Alert Behavior During Therapy: WFL for tasks assessed/performed, Anxious   PT - Cognitive impairments: No apparent impairments                       PT  - Cognition Comments: Pt was constantly monitoring his SpO2. He would take breaks early and for a prolonged period of time as a precaution to avoid desaturation. He was nervous about his O2 dropping suddenly on him. Following commands: Intact      Cueing Cueing Techniques: Verbal cues  Exercises      General Comments General comments (skin integrity, edema, etc.): Pt on 10L HFNC. He desaturated to 79% SpO2 following 3rd sit to stand requiring bump up to 15L, ~35min seated rest, and cues for PLB to recover. Titrated pt back down to 10L and completed 5 more sit<>stands. Pt desaturated to 83-86% and required bump up to 11L and ~74min seaeted rest to recover. Titrated pt back down  to 10L and ambulated within the room with pt's SpO2 hovering between 87-89% with pt taking intermittent standing rests and using PLB technique. Pt returned to bed and recovered to 93% SpO2 on 10L HFNC.      Pertinent Vitals/Pain Pain Assessment Pain Assessment: No/denies pain    Home Living                          Prior Function            PT Goals (current goals can now be found in the care plan section) Acute Rehab PT Goals Patient Stated Goal: Tolerate moving with my oxygen  staying up Progress towards PT goals: Progressing toward goals    Frequency    Min 2X/week      PT Plan      Co-evaluation              AM-PAC PT "6 Clicks" Mobility   Outcome Measure  Help needed turning from your back to your side while in a flat bed without using bedrails?: None Help needed moving from lying on your back to sitting on the side of a flat bed without using bedrails?: None Help needed moving to and from a bed to a chair (including a wheelchair)?: None Help needed standing up from a chair using your arms (e.g., wheelchair or bedside chair)?: None Help needed to walk in hospital room?: A Little Help needed climbing 3-5 steps with a railing? : Total 6 Click Score: 20    End of Session  Equipment Utilized During Treatment: Gait belt;Oxygen  Activity Tolerance: Patient tolerated treatment well;Treatment limited secondary to medical complications (Comment) (High oxygen  requirement and desaturation with activity) Patient left: in bed;with call bell/phone within reach Nurse Communication: Mobility status;Other (comment) (desaturation during session) PT Visit Diagnosis: Difficulty in walking, not elsewhere classified (R26.2);Muscle weakness (generalized) (M62.81)     Time: 4098-1191 PT Time Calculation (min) (ACUTE ONLY): 33 min  Charges:    $Gait Training: 8-22 mins $Therapeutic Activity: 8-22 mins PT General Charges $$ ACUTE PT VISIT: 1 Visit                     Glenford Lanes, PT, DPT Acute Rehabilitation Services Office: [phone] Secure Chat Preferred  Riva Chester 08/04/2023, 4:11 PM

## 2023-08-05 DIAGNOSIS — Z7189 Other specified counseling: Secondary | ICD-10-CM

## 2023-08-05 DIAGNOSIS — Z515 Encounter for palliative care: Secondary | ICD-10-CM

## 2023-08-05 DIAGNOSIS — J9621 Acute and chronic respiratory failure with hypoxia: Secondary | ICD-10-CM | POA: Diagnosis not present

## 2023-08-05 LAB — BASIC METABOLIC PANEL WITH GFR
Anion gap: 9 (ref 5–15)
BUN: 17 mg/dL (ref 8–23)
CO2: 32 mmol/L (ref 22–32)
Calcium: 9.5 mg/dL (ref 8.9–10.3)
Chloride: 96 mmol/L — ABNORMAL LOW (ref 98–111)
Creatinine, Ser: 1.2 mg/dL (ref 0.61–1.24)
GFR, Estimated: >60 mL/min (ref >60)
Glucose, Bld: 76 mg/dL (ref 70–99)
Potassium: 3.9 mmol/L (ref 3.5–5.1)
Sodium: 137 mmol/L (ref 135–145)

## 2023-08-05 LAB — CBC WITH DIFFERENTIAL/PLATELET
Abs Immature Granulocytes: 0.12 10*3/uL — ABNORMAL HIGH (ref 0.00–0.07)
Basophils Absolute: 0.1 10*3/uL (ref 0.0–0.1)
Basophils Relative: 1 %
Eosinophils Absolute: 0.1 10*3/uL (ref 0.0–0.5)
Eosinophils Relative: 1 %
HCT: 41.1 % (ref 39.0–52.0)
Hemoglobin: 12.4 g/dL — ABNORMAL LOW (ref 13.0–17.0)
Immature Granulocytes: 1 %
Lymphocytes Relative: 22 %
Lymphs Abs: 3.8 10*3/uL (ref 0.7–4.0)
MCH: 22.7 pg — ABNORMAL LOW (ref 26.0–34.0)
MCHC: 30.2 g/dL (ref 30.0–36.0)
MCV: 75.1 fL — ABNORMAL LOW (ref 80.0–100.0)
Monocytes Absolute: 1.9 10*3/uL — ABNORMAL HIGH (ref 0.1–1.0)
Monocytes Relative: 11 %
Neutro Abs: 11 10*3/uL — ABNORMAL HIGH (ref 1.7–7.7)
Neutrophils Relative %: 64 %
Platelets: 352 10*3/uL (ref 150–400)
RBC: 5.47 MIL/uL (ref 4.22–5.81)
RDW: 13.9 % (ref 11.5–15.5)
WBC: 17 10*3/uL — ABNORMAL HIGH (ref 4.0–10.5)
nRBC: 0 % (ref 0.0–0.2)

## 2023-08-05 LAB — PROCALCITONIN: Procalcitonin: <0.1 ng/mL

## 2023-08-05 LAB — C-REACTIVE PROTEIN: CRP: 0.6 mg/dL (ref <1.0)

## 2023-08-05 LAB — BRAIN NATRIURETIC PEPTIDE: B Natriuretic Peptide: 29.7 pg/mL (ref 0.0–100.0)

## 2023-08-05 LAB — MAGNESIUM: Magnesium: 2.2 mg/dL (ref 1.7–2.4)

## 2023-08-05 MED ORDER — DICLOFENAC SODIUM 1 % EX GEL
2.0000 g | Freq: Four times a day (QID) | CUTANEOUS | Status: DC
  Administered 2023-08-05: 2 g via TOPICAL
  Filled 2023-08-05: qty 100

## 2023-08-05 MED ORDER — SIMETHICONE 80 MG PO CHEW
80.0000 mg | CHEWABLE_TABLET | Freq: Four times a day (QID) | ORAL | Status: DC | PRN
  Administered 2023-08-05: 80 mg via ORAL
  Filled 2023-08-05: qty 1

## 2023-08-05 NOTE — Progress Notes (Signed)
 Progress Note    [name] [name]   ZOX:[id number]  DOB: 1961-03-08  DOA: 07/31/2023     5 PCP: Joaquin Mulberry, MD  Initial CC: worsening hypoxia  Hospital Course: [name] is a 63 yo male with PMH COPD, chronic hypoxia (on 10L O2 at home), HTN, pulmonary HTN, CKD, history of tobacco use who presented with worsening shortness of breath and worsening hypoxia on home oxygen  settings. CT angio chest was negative for PE and showed right lower lobe pneumonia with background of advanced emphysema.  Also new lung nodules noted.  He was started on antibiotics and admitted for further oxygen  weaning.  Subjective:  Patient in bed, appears comfortable, denies any headache, no fever, no chest pain or pressure, improving shortness of breath , no abdominal pain. No new focal weakness.  Assessment and Plan:   Acute on chronic respiratory failure with hypoxia (HCC) due to combination of pneumonia possible community-acquired along with mild COPD exacerbation, chronically on 10 L nasal cannula oxygen  at home.  - On 10 L oxygen  at baseline at home and requiring up to 15 L salter high flow in the ER, has been placed on Emperic ABX, steroids, supplemental oxygen  along with I-S and flutter valve for pulmonary toiletry, encouraged to sit up in chair in the daytime, increase activity wean down oxygen .  Will consult speech therapy to rule out any occult aspiration along with pulmonary as well, seen by pulmonary recommended to do steroid taper over 2 weeks, outpatient pulmonary follow-up for pulmonary nodule evaluation.  Address goals of care would involve palliative care, poor long-term prognosis.  Pulmonary nodules  - per CTA chest: ". Irregular 2.1 cm nodule in the lingula and 10 mm nodule in the right upper lobe.  In by pulmonary want to follow in the outpatient setting.  Follow-up with Dr. Waylan Haggard postdischarge.  Hypertension  - Continue amlodipine   CKD (chronic kidney disease) stage 2, GFR 60-89 ml/min  - patient  has history of CKD2. Baseline creat ~ 1.1 - 1.2  Tobacco abuse, in remission  - He endorses no further tobacco use in years,  DC'd nicotine  patch    Antimicrobials: Azithromycin  07/31/2023 >> current Rocephin  07/31/2023 >> current  DVT prophylaxis:  heparin  injection 5,000 Units Start: 07/31/23 1615   Code Status:   Code Status: Do not attempt resuscitation (DNR) PRE-ARREST INTERVENTIONS DESIRED  Barriers to discharge: medical workup Disposition Plan:  Home HH orders placed: n/a Consultation.  Pulmonary critical care.  Palliative care.  Status is: Inpt  Objective: Blood pressure 116/84, pulse 95, temperature 98 F (36.7 C), temperature source Axillary, resp. rate (!) 22, height 5\' 6"  (1.676 m), weight 84 kg, SpO2 97%.   Examination:   Awake Alert, No new F.N deficits, Normal affect [place].AT,PERRAL Supple Neck, No JVD,   Symmetrical Chest wall movement, Mod air movement bilaterally, mild wheezing RRR,No Gallops, Rubs or new Murmurs,  +ve B.Sounds, Abd Soft, No tenderness,   No Cyanosis, Clubbing or edema     Data Review:   Inpatient Medications  Scheduled Meds:  albuterol   2.5 mg Nebulization BID   amLODipine   10 mg Oral Daily   azithromycin   500 mg Oral Daily   cyanocobalamin   500 mcg Oral Daily   ezetimibe   10 mg Oral Daily   feeding supplement  237 mL Oral BID BM   guaiFENesin   600 mg Oral BID   heparin   5,000 Units Subcutaneous Q8H   mometasone -formoterol   2 puff Inhalation BID   multivitamin with minerals  1 tablet Oral Daily   pantoprazole   40 mg Oral BID   thiamine   100 mg Oral Daily   tiotropium  18 mcg Inhalation Daily   Continuous Infusions:   PRN Meds:.acetaminophen  **OR** acetaminophen , albuterol , benzonatate , hydrALAZINE , loperamide, simethicone , sodium chloride  flush, traMADol   DVT Prophylaxis  heparin  injection 5,000 Units Start: 07/31/23 1615  Recent Labs  Lab 07/31/23 1200 07/31/23 1212 08/02/23 0454 08/03/23 0442 08/04/23 0442  08/05/23 0611  WBC 14.0*  --  18.6* 16.7* 16.7* 17.0*  HGB 13.1 16.0 11.2* 11.8* 12.3* 12.4*  HCT 44.2 47.0 36.6* 38.1* 40.5 41.1  PLT 343  --  316 324 335 352  MCV 78.8*  --  75.2* 74.7* 74.6* 75.1*  MCH 23.4*  --  23.0* 23.1* 22.7* 22.7*  MCHC 29.6*  --  30.6 31.0 30.4 30.2  RDW 13.8  --  13.5 13.7 13.9 13.9  LYMPHSABS 3.3  --  2.1 2.6 3.1 3.8  MONOABS 1.7*  --  1.7* 1.7* 2.0* 1.9*  EOSABS 0.2  --  0.0 0.0 0.1 0.1  BASOSABS 0.1  --  0.0 0.0 0.0 0.1    Recent Labs  Lab 07/31/23 1200 07/31/23 1212 07/31/23 1450 07/31/23 1840 08/01/23 0500 08/02/23 0454 08/02/23 0523 08/03/23 0442 08/04/23 0442 08/05/23 0611  NA 137 137  --   --   --  138  --  136 138 137  K 3.7 3.7  --   --   --  4.1  --  3.9 3.9 3.9  CL 96*  --   --   --   --  98  --  96* 97* 96*  CO2 30  --   --   --   --  31  --  29 32 32  ANIONGAP 11  --   --   --   --  9  --  11 9 9   GLUCOSE 130*  --   --   --   --  89  --  76 79 76  BUN 10  --   --   --   --  16  --  17 16 17   CREATININE 1.16  --   --   --   --  1.17  --  1.06 1.14 1.20  CRP  --   --   --   --   --   --  1.3* 0.8 0.6 0.6  PROCALCITON <0.10  --   --   --   --   --  0.44 0.18 <0.10  --   LATICACIDVEN  --   --  3.6* 3.7*  --   --   --   --   --   --   TSH 2.632  --   --   --   --   --   --   --   --   --   BNP 32.5  --   --   --  58.2  --   --  53.2 32.5 29.7  MG  --   --   --   --   --  2.2  --  2.2 2.2 2.2  CALCIUM  9.3  --   --   --   --  9.2  --  9.3 9.2 9.5      Recent Labs  Lab 07/31/23 1200 07/31/23 1450 07/31/23 1840 08/01/23 0500 08/02/23 0454 08/02/23 0523 08/03/23 0442 08/04/23 0442 08/05/23 0611  CRP  --   --   --   --   --  1.3* 0.8 0.6 0.6  PROCALCITON <0.10  --   --   --   --  0.44 0.18 <0.10  --   LATICACIDVEN  --  3.6* 3.7*  --   --   --   --   --   --   TSH 2.632  --   --   --   --   --   --   --   --   BNP 32.5  --   --  58.2  --   --  53.2 32.5 29.7  MG  --   --   --   --  2.2  --  2.2 2.2 2.2  CALCIUM  9.3  --   --    --  9.2  --  9.3 9.2 9.5    --------------------------------------------------------------------------------------------------------------- Lab Results  Component Value Date   CHOL 222 (H) 06/08/2020   HDL 48 06/08/2020   LDLCALC 161 (H) 06/08/2020   TRIG 76 06/08/2020   CHOLHDL 4.6 06/08/2020    Lab Results  Component Value Date   HGBA1C 5.0 03/14/2022   No results for input(s): "TSH", "T4TOTAL", "FREET4", "T3FREE", "THYROIDAB" in the last 72 hours.     Micro Results Recent Results (from the past 240 hours)  Resp panel by RT-PCR (RSV, Flu A&B, Covid) Anterior Nasal Swab     Status: None   Collection Time: 07/31/23 12:00 PM   Specimen: Anterior Nasal Swab  Result Value Ref Range Status   SARS Coronavirus 2 by RT PCR NEGATIVE NEGATIVE Final   Influenza A by PCR NEGATIVE NEGATIVE Final   Influenza B by PCR NEGATIVE NEGATIVE Final    Comment: (NOTE) The Xpert Xpress SARS-CoV-2/FLU/RSV plus assay is intended as an aid in the diagnosis of influenza from Nasopharyngeal swab specimens and should not be used as a sole basis for treatment. Nasal washings and aspirates are unacceptable for Xpert Xpress SARS-CoV-2/FLU/RSV testing.  Fact Sheet for Patients: BloggerCourse.com  Fact Sheet for Healthcare Providers: SeriousBroker.it  This test is not yet approved or cleared by the [place]  FDA and has been authorized for detection and/or diagnosis of SARS-CoV-2 by FDA under an Emergency Use Authorization (EUA). This EUA will remain in effect (meaning this test can be used) for the duration of the COVID-19 declaration under Section 564(b)(1) of the Act, 21 U.S.C. section 360bbb-3(b)(1), unless the authorization is terminated or revoked.     Resp Syncytial Virus by PCR NEGATIVE NEGATIVE Final    Comment: (NOTE) Fact Sheet for Patients: BloggerCourse.com  Fact Sheet for Healthcare  Providers: SeriousBroker.it  This test is not yet approved or cleared by the [place]  FDA and has been authorized for detection and/or diagnosis of SARS-CoV-2 by FDA under an Emergency Use Authorization (EUA). This EUA will remain in effect (meaning this test can be used) for the duration of the COVID-19 declaration under Section 564(b)(1) of the Act, 21 U.S.C. section 360bbb-3(b)(1), unless the authorization is terminated or revoked.  Performed at [facility], [address] [place], [place] [address]     Radiology Reports  DG Swallowing [facility] Pathology Result Date: 08/03/2023 Table formatting from the original result was not included. Modified Barium Swallow Study Patient Details Name: [name] [name] MRN: [id number] Date of Birth: 1960/04/19 Today's Date: 08/03/2023 HPI/PMH: HPI: Ademide Moreau is a 63 yo male presenting to ED 4/22 with worsening SOB on home O2 settings. CT Chest negative for PE but showed RLL PNA with background of advanced emphysema in addition  to an irregular 2.1 cm nodule in the lingula and a 10 mm nodule in the RUL. PMH includes COPD, chronic hypoxia (on 10L supplemental O2 at baseline), HTN, CKD, history of tobacco use Clinical Impression: Clinical Impression: Pt presents with mild oropharyngeal dysphagia characterized by mistiming. He is currently requiring 15LPM [place], which is increased from his baseline (10L). He consistently initiates a swallow response at the level of the pyriform sinuses, which allows superficial penetration of thin liquids that does not spontaneously clear (PAS 3). It does not progress to the level of the vocal folds and was eventually cleared with cued throat clearance and subswallows. There is no significant oropharyngeal residue. He masticates promptly and thoroughly despite only having one lower tooth. Pt took the 13 mm barium tablet with thin liquids, which was [facility] Provided education regarding using increased  caution in times of acute respiratory decline. Recommend he continue his current diet without ongoing SLP f/u. Will sign off. Factors that may increase risk of adverse event in presence of aspiration Roderick Civatte & Jessy Morocco 2021): Factors that may increase risk of adverse event in presence of aspiration Roderick Civatte & Jessy Morocco 2021): Respiratory or GI disease Recommendations/Plan: Swallowing Evaluation Recommendations Swallowing Evaluation Recommendations Recommendations: PO diet PO Diet Recommendation: Regular; Thin liquids (Level 0) Liquid Administration via: Cup; Straw Medication Administration: Whole meds with liquid Supervision: Patient able to self-feed Swallowing strategies  : Minimize environmental distractions; Slow rate; Small bites/sips Postural changes: Position pt fully upright for meals Oral care recommendations: Oral care BID (2x/day) Treatment Plan Treatment Plan Treatment recommendations: No treatment recommended at this time Follow-up recommendations: No SLP follow up Functional status assessment: Patient has not had a recent decline in their functional status. Recommendations Recommendations for follow up therapy are one component of a multi-disciplinary discharge planning process, led by the attending physician.  Recommendations may be updated based on patient status, additional functional criteria and insurance authorization. Assessment: Orofacial Exam: Orofacial Exam Oral Cavity: Oral Hygiene: WFL Oral Cavity - Dentition: Missing dentition (one lower tooth) Orofacial Anatomy: WFL Oral Motor/Sensory Function: WFL Anatomy: Anatomy: Suspected cervical osteophytes Boluses Administered: Boluses Administered Boluses Administered: Thin liquids (Level 0); Mildly thick liquids (Level 2, nectar thick); Moderately thick liquids (Level 3, honey thick); Puree; Solid  Oral Impairment Domain: Oral Impairment Domain Lip Closure: No labial escape Tongue control during bolus hold: Cohesive bolus between tongue to palatal  seal Bolus preparation/mastication: Timely and efficient chewing and mashing Bolus transport/lingual motion: Brisk tongue motion Oral residue: Complete oral clearance Location of oral residue : N/A Initiation of pharyngeal swallow : Posterior laryngeal surface of the epiglottis  Pharyngeal Impairment Domain: Pharyngeal Impairment Domain Soft palate elevation: No bolus between soft palate (SP)/pharyngeal wall (PW) Laryngeal elevation: Complete superior movement of thyroid  cartilage with complete approximation of arytenoids to epiglottic petiole Anterior hyoid excursion: Complete anterior movement Epiglottic movement: Complete inversion Laryngeal vestibule closure: Complete, no air/contrast in laryngeal vestibule Pharyngeal stripping wave : Present - complete Pharyngeal contraction (A/P view only): N/A Pharyngoesophageal segment opening: Complete distension and complete duration, no obstruction of flow Tongue base retraction: No contrast between tongue base and posterior pharyngeal wall (PPW) Pharyngeal residue: Complete pharyngeal clearance Location of pharyngeal residue: N/A  Esophageal Impairment Domain: Esophageal Impairment Domain Esophageal clearance upright position: Complete clearance, esophageal coating Pill: Pill Consistency administered: Thin liquids (Level 0) Thin liquids (Level 0): [facility] Penetration/Aspiration Scale Score: Penetration/Aspiration Scale Score 1.  Material does not enter airway: Mildly thick liquids (Level 2, nectar thick); Moderately thick liquids (Level 3, honey  thick); Puree; Solid; Pill 3.  Material enters airway, remains ABOVE vocal cords and not ejected out: Thin liquids (Level 0) Compensatory Strategies: Compensatory Strategies Compensatory strategies: Yes Straw: Effective Effective Straw: Thin liquid (Level 0); Mildly thick liquid (Level 2, nectar thick)   General Information: Caregiver present: Yes  Diet Prior to this Study: Regular; Thin liquids (Level 0)   Temperature : Normal    Respiratory Status: WFL   Supplemental O2: Nasal cannula (15LPM)   History of Recent Intubation: No  Behavior/Cognition: Alert; Cooperative; Pleasant mood Self-Feeding Abilities: Able to self-feed Baseline vocal quality/speech: Normal Volitional Cough: Able to elicit Volitional Swallow: Able to elicit Exam Limitations: No limitations Goal Planning: Prognosis for improved oropharyngeal function: Good Barriers to Reach Goals: Time post onset No data recorded Patient/Family Stated Goal: none stated Consulted and agree with results and recommendations: Patient; Nurse Pain: Pain Assessment Pain Assessment: No/denies pain End of Session: Start Time:SLP Start Time (ACUTE ONLY): 0955 Stop Time: SLP Stop Time (ACUTE ONLY): 1007 Time Calculation:SLP Time Calculation (min) (ACUTE ONLY): 12 min Charges: SLP Evaluations $ SLP Speech Visit: 1 Visit SLP Evaluations $BSS Swallow: 1 Procedure $MBS Swallow: 1 Procedure SLP visit diagnosis: SLP Visit Diagnosis: Dysphagia, oropharyngeal phase (R13.12) Past Medical History: Past Medical History: Diagnosis Date  Asthma 04/10/1997  CKD (chronic kidney disease), 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  COPD (chronic obstructive pulmonary disease) (HCC)   on 4L home O2  Essential hypertension   Gout   Thrombocytopenia (HCC) 07/28/2012 Past Surgical History: Past Surgical History: Procedure Laterality Date  FRACTURE SURGERY  childhood  right arm  KNEE SURGERY    right knee s/p trauma Amil Kale, M.A., CCC-SLP Speech Language Pathology, Acute Rehabilitation Services Secure Chat preferred [phone] 08/03/2023, 10:40 AM      Signature  -   Lynnwood Sauer M.D on 08/05/2023 at 9:16 AM   -  To page go to [email or web address]

## 2023-08-05 NOTE — Plan of Care (Signed)

## 2023-08-05 NOTE — Plan of Care (Signed)
Problem: Clinical Measurements: Goal: Ability to maintain clinical measurements within normal limits will improve Outcome: Progressing Goal: Will remain free from infection Outcome: Progressing Goal: Respiratory complications will improve Outcome: Progressing   Problem: Activity: Goal: Risk for activity intolerance will decrease Outcome: Progressing   

## 2023-08-05 NOTE — TOC Progression Note (Signed)
 Transition of Care [facility]) - Progression Note    Patient Details  Name: [name] [name] MRN: [id number] Date of Birth: June 11, 1960  Transition of [facility]) CM/SW Contact  Omie Bickers, RN Phone Number: 08/05/2023, 7:37 AM  Clinical Narrative:     BSC ordered to be delivered to room per request of PMT PA       Expected Discharge Plan and Services                         DME Arranged: Bedside commode DME Agency: [employer] Date DME Agency Contacted: 08/03/23 Time DME Agency Contacted: 1630 Representative spoke with at DME Agency: Zula Hitch             Social Determinants of Health (SDOH) Interventions SDOH Screenings   Food Insecurity: No Food Insecurity (08/01/2023)  Housing: Low Risk  (08/01/2023)  Transportation Needs: Unmet Transportation Needs (08/01/2023)  Utilities: At Risk (08/01/2023)  Depression (PHQ2-9): Low Risk  (05/25/2022)  Tobacco Use: Medium Risk (07/31/2023)    Readmission Risk Interventions    06/20/2023    4:32 PM  Readmission Risk Prevention Plan  Post Dischage Appt Complete  Medication Screening Complete  Transportation Screening Complete

## 2023-08-06 DIAGNOSIS — J9621 Acute and chronic respiratory failure with hypoxia: Secondary | ICD-10-CM | POA: Diagnosis not present

## 2023-08-06 DIAGNOSIS — Z515 Encounter for palliative care: Secondary | ICD-10-CM | POA: Diagnosis not present

## 2023-08-06 DIAGNOSIS — Z7189 Other specified counseling: Secondary | ICD-10-CM | POA: Diagnosis not present

## 2023-08-06 LAB — CBC WITH DIFFERENTIAL/PLATELET
Abs Immature Granulocytes: 0.15 10*3/uL — ABNORMAL HIGH (ref 0.00–0.07)
Basophils Absolute: 0 10*3/uL (ref 0.0–0.1)
Basophils Relative: 0 %
Eosinophils Absolute: 0.2 10*3/uL (ref 0.0–0.5)
Eosinophils Relative: 1 %
HCT: 39 % (ref 39.0–52.0)
Hemoglobin: 11.8 g/dL — ABNORMAL LOW (ref 13.0–17.0)
Immature Granulocytes: 1 %
Lymphocytes Relative: 19 %
Lymphs Abs: 3.2 10*3/uL (ref 0.7–4.0)
MCH: 22.9 pg — ABNORMAL LOW (ref 26.0–34.0)
MCHC: 30.3 g/dL (ref 30.0–36.0)
MCV: 75.7 fL — ABNORMAL LOW (ref 80.0–100.0)
Monocytes Absolute: 1.9 10*3/uL — ABNORMAL HIGH (ref 0.1–1.0)
Monocytes Relative: 12 %
Neutro Abs: 10.8 10*3/uL — ABNORMAL HIGH (ref 1.7–7.7)
Neutrophils Relative %: 67 %
Platelets: 327 10*3/uL (ref 150–400)
RBC: 5.15 MIL/uL (ref 4.22–5.81)
RDW: 14 % (ref 11.5–15.5)
WBC: 16.2 10*3/uL — ABNORMAL HIGH (ref 4.0–10.5)
nRBC: 0 % (ref 0.0–0.2)

## 2023-08-06 LAB — BASIC METABOLIC PANEL WITH GFR
Anion gap: 9 (ref 5–15)
BUN: 17 mg/dL (ref 8–23)
CO2: 31 mmol/L (ref 22–32)
Calcium: 9.3 mg/dL (ref 8.9–10.3)
Chloride: 98 mmol/L (ref 98–111)
Creatinine, Ser: 1.13 mg/dL (ref 0.61–1.24)
GFR, Estimated: >60 mL/min (ref >60)
Glucose, Bld: 81 mg/dL (ref 70–99)
Potassium: 3.9 mmol/L (ref 3.5–5.1)
Sodium: 138 mmol/L (ref 135–145)

## 2023-08-06 LAB — PROCALCITONIN: Procalcitonin: 0.14 ng/mL

## 2023-08-06 LAB — MAGNESIUM: Magnesium: 2.2 mg/dL (ref 1.7–2.4)

## 2023-08-06 LAB — C-REACTIVE PROTEIN: CRP: 0.5 mg/dL (ref <1.0)

## 2023-08-06 LAB — BRAIN NATRIURETIC PEPTIDE: B Natriuretic Peptide: 18.9 pg/mL (ref 0.0–100.0)

## 2023-08-06 MED ORDER — PREDNISONE 5 MG PO TABS
30.0000 mg | ORAL_TABLET | Freq: Every day | ORAL | Status: DC
  Administered 2023-08-07: 30 mg via ORAL
  Filled 2023-08-06: qty 2

## 2023-08-06 NOTE — Plan of Care (Signed)

## 2023-08-06 NOTE — Progress Notes (Signed)
 Progress Note    [name] [name]   WUJ:[id number]  DOB: 10/06/1960  DOA: 07/31/2023     6 PCP: Joaquin Mulberry, MD  Initial CC: worsening hypoxia  Hospital Course: [name] [name] is a 63 yo male with PMH COPD, chronic hypoxia (on 10L O2 at home), HTN, pulmonary HTN, CKD, history of tobacco use who presented with worsening shortness of breath and worsening hypoxia on home oxygen  settings. CT angio chest was negative for PE and showed right lower lobe pneumonia with background of advanced emphysema.  Also new lung nodules noted.  He was started on antibiotics and admitted for further oxygen  weaning.  Subjective:  Patient in bed, appears comfortable, denies any headache, no fever, no chest pain or pressure, no shortness of breath , no abdominal pain. No new focal weakness.  Assessment and Plan:   Acute on chronic respiratory failure with hypoxia (HCC) due to combination of pneumonia possible community-acquired along with mild COPD exacerbation, chronically on 10 L nasal cannula oxygen  at home.  - On 10 L oxygen  at baseline at home and requiring up to 15 L salter high flow in the ER, has been placed on Emperic ABX, steroids, supplemental oxygen  along with I-S and flutter valve for pulmonary toiletry, encouraged to sit up in chair in the daytime, increase activity wean down oxygen .  Will consult speech therapy to rule out any occult aspiration along with pulmonary as well, seen by pulmonary recommended to do steroid taper over 2 weeks, outpatient pulmonary follow-up for pulmonary nodule evaluation.  Address goals of care would involve palliative care, poor long-term prognosis.  Pulmonary nodules  - per CTA chest: ". Irregular 2.1 cm nodule in the lingula and 10 mm nodule in the right upper lobe.  In by pulmonary want to follow in the outpatient setting.  Follow-up with Dr. Waylan Haggard postdischarge.  Hypertension  - Continue amlodipine   CKD (chronic kidney disease) stage 2, GFR 60-89 ml/min  - patient has  history of CKD2. Baseline creat ~ 1.1 - 1.2  Tobacco abuse, in remission  - He endorses no further tobacco use in years,  DC'd nicotine  patch    Antimicrobials: Azithromycin  07/31/2023 >> current Rocephin  07/31/2023 >> current  DVT prophylaxis:  heparin  injection 5,000 Units Start: 07/31/23 1615   Code Status:   Code Status: Do not attempt resuscitation (DNR) PRE-ARREST INTERVENTIONS DESIRED  Barriers to discharge: medical workup Disposition Plan:  Home HH orders placed: n/a Consultation.  Pulmonary critical care.  Palliative care.  Status is: Inpt  Objective: Blood pressure (!) 126/90, pulse 78, temperature 97.7 F (36.5 C), temperature source Oral, resp. rate 19, height 5\' 6"  (1.676 m), weight 84 kg, SpO2 100%.   Examination:   Awake Alert, No new F.N deficits, Normal affect [place].AT,PERRAL Supple Neck, No JVD,   Symmetrical Chest wall movement, Mod air movement bilaterally, mild wheezing RRR,No Gallops, Rubs or new Murmurs,  +ve B.Sounds, Abd Soft, No tenderness,   No Cyanosis, Clubbing or edema     Data Review:   Inpatient Medications  Scheduled Meds:  albuterol   2.5 mg Nebulization BID   amLODipine   10 mg Oral Daily   azithromycin   500 mg Oral Daily   cyanocobalamin   500 mcg Oral Daily   diclofenac  Sodium  2 g Topical QID   ezetimibe   10 mg Oral Daily   feeding supplement  237 mL Oral BID BM   guaiFENesin   600 mg Oral BID   heparin   5,000 Units Subcutaneous Q8H   mometasone -formoterol   2  puff Inhalation BID   multivitamin with minerals  1 tablet Oral Daily   pantoprazole   40 mg Oral BID   thiamine   100 mg Oral Daily   tiotropium  18 mcg Inhalation Daily   Continuous Infusions:   PRN Meds:.acetaminophen  **OR** acetaminophen , albuterol , benzonatate , hydrALAZINE , loperamide, simethicone , sodium chloride  flush, traMADol   DVT Prophylaxis  heparin  injection 5,000 Units Start: 07/31/23 1615  Recent Labs  Lab 08/02/23 0454 08/03/23 0442 08/04/23 0442  08/05/23 0611 08/06/23 0439  WBC 18.6* 16.7* 16.7* 17.0* 16.2*  HGB 11.2* 11.8* 12.3* 12.4* 11.8*  HCT 36.6* 38.1* 40.5 41.1 39.0  PLT 316 324 335 352 327  MCV 75.2* 74.7* 74.6* 75.1* 75.7*  MCH 23.0* 23.1* 22.7* 22.7* 22.9*  MCHC 30.6 31.0 30.4 30.2 30.3  RDW 13.5 13.7 13.9 13.9 14.0  LYMPHSABS 2.1 2.6 3.1 3.8 3.2  MONOABS 1.7* 1.7* 2.0* 1.9* 1.9*  EOSABS 0.0 0.0 0.1 0.1 0.2  BASOSABS 0.0 0.0 0.0 0.1 0.0    Recent Labs  Lab 07/31/23 1200 07/31/23 1212 07/31/23 1450 07/31/23 1840 08/01/23 0500 08/02/23 0454 08/02/23 0523 08/03/23 0442 08/04/23 0442 08/05/23 0611 08/06/23 0439  NA 137   < >  --   --   --  138  --  136 138 137 138  K 3.7   < >  --   --   --  4.1  --  3.9 3.9 3.9 3.9  CL 96*  --   --   --   --  98  --  96* 97* 96* 98  CO2 30  --   --   --   --  31  --  29 32 32 31  ANIONGAP 11  --   --   --   --  9  --  11 9 9 9   GLUCOSE 130*  --   --   --   --  89  --  76 79 76 81  BUN 10  --   --   --   --  16  --  17 16 17 17   CREATININE 1.16  --   --   --   --  1.17  --  1.06 1.14 1.20 1.13  CRP  --   --   --   --   --   --  1.3* 0.8 0.6 0.6 0.5  PROCALCITON <0.10  --   --   --   --   --  0.44 0.18 <0.10 <0.10 0.14  LATICACIDVEN  --   --  3.6* 3.7*  --   --   --   --   --   --   --   TSH 2.632  --   --   --   --   --   --   --   --   --   --   BNP 32.5  --   --   --  58.2  --   --  53.2 32.5 29.7 18.9  MG  --   --   --   --   --  2.2  --  2.2 2.2 2.2 2.2  CALCIUM  9.3  --   --   --   --  9.2  --  9.3 9.2 9.5 9.3   < > = values in this interval not displayed.      Recent Labs  Lab 07/31/23 1200 07/31/23 1450 07/31/23 1840 08/01/23 0500 08/02/23 0454 08/02/23 0523 08/03/23 0442 08/04/23  0865 08/05/23 0611 08/06/23 0439  CRP  --   --   --   --   --  1.3* 0.8 0.6 0.6 0.5  PROCALCITON <0.10  --   --   --   --  0.44 0.18 <0.10 <0.10 0.14  LATICACIDVEN  --  3.6* 3.7*  --   --   --   --   --   --   --   TSH 2.632  --   --   --   --   --   --   --   --   --    BNP 32.5  --   --  58.2  --   --  53.2 32.5 29.7 18.9  MG  --   --   --   --  2.2  --  2.2 2.2 2.2 2.2  CALCIUM  9.3  --   --   --  9.2  --  9.3 9.2 9.5 9.3    --------------------------------------------------------------------------------------------------------------- Lab Results  Component Value Date   CHOL 222 (H) 06/08/2020   HDL 48 06/08/2020   LDLCALC 161 (H) 06/08/2020   TRIG 76 06/08/2020   CHOLHDL 4.6 06/08/2020    Lab Results  Component Value Date   HGBA1C 5.0 03/14/2022   No results for input(s): "TSH", "T4TOTAL", "FREET4", "T3FREE", "THYROIDAB" in the last 72 hours.     Micro Results Recent Results (from the past 240 hours)  Resp panel by RT-PCR (RSV, Flu A&B, Covid) Anterior Nasal Swab     Status: None   Collection Time: 07/31/23 12:00 PM   Specimen: Anterior Nasal Swab  Result Value Ref Range Status   SARS Coronavirus 2 by RT PCR NEGATIVE NEGATIVE Final   Influenza A by PCR NEGATIVE NEGATIVE Final   Influenza B by PCR NEGATIVE NEGATIVE Final    Comment: (NOTE) The Xpert Xpress SARS-CoV-2/FLU/RSV plus assay is intended as an aid in the diagnosis of influenza from Nasopharyngeal swab specimens and should not be used as a sole basis for treatment. Nasal washings and aspirates are unacceptable for Xpert Xpress SARS-CoV-2/FLU/RSV testing.  Fact Sheet for Patients: BloggerCourse.com  Fact Sheet for Healthcare Providers: SeriousBroker.it  This test is not yet approved or cleared by the [place]  FDA and has been authorized for detection and/or diagnosis of SARS-CoV-2 by FDA under an Emergency Use Authorization (EUA). This EUA will remain in effect (meaning this test can be used) for the duration of the COVID-19 declaration under Section 564(b)(1) of the Act, 21 U.S.C. section 360bbb-3(b)(1), unless the authorization is terminated or revoked.     Resp Syncytial Virus by PCR NEGATIVE NEGATIVE Final     Comment: (NOTE) Fact Sheet for Patients: BloggerCourse.com  Fact Sheet for Healthcare Providers: SeriousBroker.it  This test is not yet approved or cleared by the [place]  FDA and has been authorized for detection and/or diagnosis of SARS-CoV-2 by FDA under an Emergency Use Authorization (EUA). This EUA will remain in effect (meaning this test can be used) for the duration of the COVID-19 declaration under Section 564(b)(1) of the Act, 21 U.S.C. section 360bbb-3(b)(1), unless the authorization is terminated or revoked.  Performed at [facility], [address] [place], [place] [address]     Radiology Reports  No results found.      Signature  -   Lynnwood Sauer M.D on 08/06/2023 at 8:14 AM   -  To page go to [email or web address]

## 2023-08-06 NOTE — Plan of Care (Signed)
°  Problem: Clinical Measurements: °Goal: Ability to maintain clinical measurements within normal limits will improve °Outcome: Progressing °Goal: Will remain free from infection °Outcome: Progressing °Goal: Respiratory complications will improve °Outcome: Progressing °  °Problem: Activity: °Goal: Risk for activity intolerance will decrease °Outcome: Progressing °  °Problem: Coping: °Goal: Level of anxiety will decrease °Outcome: Progressing °  °

## 2023-08-07 ENCOUNTER — Other Ambulatory Visit (HOSPITAL_COMMUNITY): Payer: Self-pay

## 2023-08-07 DIAGNOSIS — J9621 Acute and chronic respiratory failure with hypoxia: Secondary | ICD-10-CM | POA: Diagnosis not present

## 2023-08-07 DIAGNOSIS — Z515 Encounter for palliative care: Secondary | ICD-10-CM | POA: Diagnosis not present

## 2023-08-07 DIAGNOSIS — Z7189 Other specified counseling: Secondary | ICD-10-CM | POA: Diagnosis not present

## 2023-08-07 MED ORDER — LOPERAMIDE HCL 2 MG PO CAPS
4.0000 mg | ORAL_CAPSULE | Freq: Four times a day (QID) | ORAL | 0 refills | Status: DC | PRN
  Filled 2023-08-07: qty 10, 2d supply, fill #0

## 2023-08-07 MED ORDER — VITAMIN B-1 100 MG PO TABS
100.0000 mg | ORAL_TABLET | Freq: Every day | ORAL | 0 refills | Status: DC
Start: 2023-08-07 — End: 2023-09-09
  Filled 2023-08-07: qty 30, 30d supply, fill #0

## 2023-08-07 MED ORDER — PREDNISONE 5 MG PO TABS
ORAL_TABLET | ORAL | 0 refills | Status: DC
Start: 2023-08-07 — End: 2023-09-09
  Filled 2023-08-07: qty 65, 19d supply, fill #0

## 2023-08-07 MED ORDER — AZITHROMYCIN 500 MG PO TABS
500.0000 mg | ORAL_TABLET | Freq: Every day | ORAL | 0 refills | Status: DC
Start: 2023-08-07 — End: 2023-09-09
  Filled 2023-08-07: qty 2, 2d supply, fill #0

## 2023-08-07 MED ORDER — METOPROLOL SUCCINATE ER 25 MG PO TB24
25.0000 mg | ORAL_TABLET | Freq: Every day | ORAL | Status: DC
  Administered 2023-08-07: 25 mg via ORAL
  Filled 2023-08-07: qty 1

## 2023-08-07 NOTE — Discharge Instructions (Signed)
 Follow with Primary MD Joaquin Mulberry, MD and with your pulmonary physician in 7 days   Get CBC, CMP, 2 view Chest X ray -  checked next visit with your primary MD   Activity: As tolerated with Full fall precautions use walker/cane & assistance as needed  Disposition Home    Diet: Heart Healthy   Special Instructions: If you have smoked or chewed Tobacco  in the last 2 yrs please stop smoking, stop any regular Alcohol  and or any Recreational drug use.  On your next visit with your primary care physician please Get Medicines reviewed and adjusted.  Please request your Prim.MD to go over all Hospital Tests and Procedure/Radiological results at the follow up, please get all Hospital records sent to your Prim MD by signing hospital release before you go home.  If you experience worsening of your admission symptoms, develop shortness of breath, life threatening emergency, suicidal or homicidal thoughts you must seek medical attention immediately by calling 911 or calling your MD immediately  if symptoms less severe.  You Must read complete instructions/literature along with all the possible adverse reactions/side effects for all the Medicines you take and that have been prescribed to you. Take any new Medicines after you have completely understood and accpet all the possible adverse reactions/side effects.   Do not drive when taking Pain medications.  Do not take more than prescribed Pain, Sleep and Anxiety Medications  Wear Seat belts while driving.

## 2023-08-07 NOTE — Discharge Summary (Signed)
 [name] [name] MWU:[id number] DOB: Aug 21, 1960 DOA: 07/31/2023  PCP: Joaquin Mulberry, MD  Admit date: 07/31/2023  Discharge date: 08/07/2023  Admitted From: Home   Disposition:  Home   Recommendations for Outpatient Follow-up:   Follow up with PCP in 1-2 weeks  PCP Please obtain BMP/CBC, 2 view CXR in 1week,  (see Discharge instructions)   PCP Please follow up on the following pending results:    Home Health: PT, RN, aide, social work if qualifies Equipment/Devices: None Consultations: Pulmonary Discharge Condition: Guarded CODE STATUS: DNR Diet Recommendation: Heart Healthy     Chief Complaint  Patient presents with   Respiratory Distress     Brief history of present illness from the day of admission and additional interim summary    63 yo male with PMH COPD, chronic hypoxia (on 10L O2 at home), HTN, pulmonary HTN, CKD, history of tobacco use who presented with worsening shortness of breath and worsening hypoxia on home oxygen  settings. CT angio chest was negative for PE and showed right lower lobe pneumonia with background of advanced emphysema.  Also new lung nodules noted.  He was started on antibiotics and admitted for further oxygen  weaning.                                                                    Hospital Course   Acute on chronic respiratory failure with hypoxia (HCC) due to combination of pneumonia possible community-acquired along with mild COPD exacerbation, chronically on 10 L nasal cannula oxygen  at home.   - On 10 L oxygen  at baseline at home and requiring up to 15 L salter high flow in the ER, was placed on Emperic ABX, steroids, supplemental oxygen  along with I-S and flutter valve for pulmonary toiletry, encouraged to sit up in chair in the daytime, increase activity wean down oxygen .   Was seen by speech therapy and pulmonary critical care along with palliative care team, after treatment with steroids and antibiotics he is much improved and back to his baseline of 10 L nasal cannula oxygen  in daytime and CPAP at night, will be discharged on steroid taper over [redacted] weeks along with 2 more days of oral antibiotics, outpatient pulmonary follow-up for pulmonary nodule evaluation. He is now feeling close to his baseline eager to go home today.   Pulmonary nodules  - per CTA chest: Irregular 2.1 cm nodule in the lingula and 10 mm nodule in the right upper lobe.  In by pulmonary want to follow in the outpatient setting.  Follow-up with Dr. Waylan Haggard postdischarge.   Hypertension  - Continue amlodipine  & resume home beta-blocker   CKD (chronic kidney disease) stage 2, GFR 60-89 ml/min  - patient has history of CKD2. Baseline creat ~ 1.1 - 1.2   Tobacco abuse, in remission  -  He endorses no further tobacco use in years,  DC'd nicotine  patch.    Discharge diagnosis     Principal Problem:   Acute on chronic respiratory failure with hypoxia (HCC) Active Problems:   CAP (community acquired pneumonia)   COPD exacerbation (HCC)   Pulmonary nodules   Tobacco abuse, in remission   CKD (chronic kidney disease) stage 2, GFR 60-89 ml/min   Hypertension   Moderate to severe pulmonary hypertension (HCC)    Discharge instructions    Discharge Instructions     Diet - low sodium heart healthy   Complete by: As directed    Discharge instructions   Complete by: As directed    Follow with Primary MD Joaquin Mulberry, MD and with your pulmonary physician in 7 days   Get CBC, CMP, 2 view Chest X ray -  checked next visit with your primary MD   Activity: As tolerated with Full fall precautions use walker/cane & assistance as needed  Disposition Home    Diet: Heart Healthy   Special Instructions: If you have smoked or chewed Tobacco  in the last 2 yrs please stop smoking, stop any regular  Alcohol  and or any Recreational drug use.  On your next visit with your primary care physician please Get Medicines reviewed and adjusted.  Please request your Prim.MD to go over all Hospital Tests and Procedure/Radiological results at the follow up, please get all Hospital records sent to your Prim MD by signing hospital release before you go home.  If you experience worsening of your admission symptoms, develop shortness of breath, life threatening emergency, suicidal or homicidal thoughts you must seek medical attention immediately by calling 911 or calling your MD immediately  if symptoms less severe.  You Must read complete instructions/literature along with all the possible adverse reactions/side effects for all the Medicines you take and that have been prescribed to you. Take any new Medicines after you have completely understood and accpet all the possible adverse reactions/side effects.   Do not drive when taking Pain medications.  Do not take more than prescribed Pain, Sleep and Anxiety Medications  Wear Seat belts while driving.   Increase activity slowly   Complete by: As directed        Discharge Medications   Allergies as of 08/07/2023       Reactions   Atorvastatin  Itching, Swelling   Facial, tongue swelling   Shellfish Allergy Anaphylaxis   Beef-derived Drug Products    Intolerance due to gout   Colchicine  Hives        Medication List     TAKE these medications    albuterol  (2.5 MG/3ML) 0.083% nebulizer solution Commonly known as: PROVENTIL  TAKE 3 MLS (2.5 MG TOTAL) BY NEBULIZATION EVERY 6 (SIX) HOURS AS NEEDED FOR SHORTNESS OF BREATH.   albuterol  108 (90 Base) MCG/ACT inhaler Commonly known as: Ventolin  HFA Inhale 2 puffs into the lungs every 6 (six) hours as needed for wheezing or shortness of breath   allopurinol  100 MG tablet Commonly known as: ZYLOPRIM  Take 1 tablet (100 mg total) by mouth daily.   amLODipine  10 MG tablet Commonly known as:  NORVASC  Take 1 tablet (10 mg total) by mouth daily.   azithromycin  500 MG tablet Commonly known as: ZITHROMAX  Take 1 tablet (500 mg total) by mouth daily.   cyanocobalamin  500 MCG tablet Commonly known as: VITAMIN B12 Take 500 mcg by mouth daily.   ezetimibe  10 MG tablet Commonly known as: ZETIA  Take 1 tablet (  10 mg total) by mouth daily.   ibuprofen  200 MG tablet Commonly known as: ADVIL  Take 800 mg by mouth daily as needed (gout pain,swelling).   loperamide 2 MG capsule Commonly known as: IMODIUM Take 2 capsules (4 mg total) by mouth every 6 (six) hours as needed for diarrhea or loose stools.   metoprolol  succinate 50 MG 24 hr tablet Commonly known as: TOPROL -XL Take 1 tablet (50 mg total) by mouth daily.   predniSONE  5 MG tablet Commonly known as: DELTASONE  Label  & dispense according to the schedule below. Take  6 Pills PO for 5 days, 4 Pills PO for 5 days, 2 Pills PO for 3 days, 1 Pills PO for 3 days, 1/2 Pill  PO for 3 days then STOP.   Spiriva  HandiHaler 18 MCG inhalation capsule Generic drug: tiotropium PLACE 1 CAPSULE (18 MCG TOTAL) INTO INHALER AND INHALE DAILY.   Symbicort  160-4.5 MCG/ACT inhaler Generic drug: budesonide -formoterol  Inhale 2 puffs into the lungs 2 (two) times daily.   thiamine  100 MG tablet Commonly known as: Vitamin B-1 Take 1 tablet (100 mg total) by mouth daily.   vitamin C  1000 MG tablet Take 1,000 mg by mouth daily.   VITAMIN D PO Take 1 tablet by mouth daily.               Durable Medical Equipment  (From admission, onward)           Start     Ordered   08/04/23 1637  For home use only DME Bedside commode  Once       Question:  Patient needs a bedside commode to treat with the following condition  Answer:  Weakness   08/04/23 1636             Follow-up Information     Joaquin Mulberry, MD. Schedule an appointment as soon as possible for a visit in 1 week(s).   Specialty: Family Medicine Why: Also follow-up  with your pulmonary physician within a week. Contact information: [address] [place] [address] [place] [address] [phone]                 Major procedures and Radiology Reports - PLEASE review detailed and final reports thoroughly  -       DG Swallowing Func-Speech Pathology Result Date: 08/03/2023 Table formatting from the original result was not included. Modified Barium Swallow Study Patient Details Name: [name] [name] MRN: [id number] Date of Birth: 08/05/1960 Today's Date: 08/03/2023 HPI/PMH: HPI: Niyan Schoof is a 63 yo male presenting to ED 4/22 with worsening SOB on home O2 settings. CT Chest negative for PE but showed RLL PNA with background of advanced emphysema in addition to an irregular 2.1 cm nodule in the lingula and a 10 mm nodule in the RUL. PMH includes COPD, chronic hypoxia (on 10L supplemental O2 at baseline), HTN, CKD, history of tobacco use Clinical Impression: Clinical Impression: Pt presents with mild oropharyngeal dysphagia characterized by mistiming. He is currently requiring 15LPM [place], which is increased from his baseline (10L). He consistently initiates a swallow response at the level of the pyriform sinuses, which allows superficial penetration of thin liquids that does not spontaneously clear (PAS 3). It does not progress to the level of the vocal folds and was eventually cleared with cued throat clearance and subswallows. There is no significant oropharyngeal residue. He masticates promptly and thoroughly despite only having one lower tooth. Pt took the 13 mm barium tablet with thin liquids, which was  WFL. Provided education regarding using increased caution in times of acute respiratory decline. Recommend he continue his current diet without ongoing SLP f/u. Will sign off. Factors that may increase risk of adverse event in presence of aspiration Roderick Civatte & Jessy Morocco 2021): Factors that may increase risk of adverse event in presence of aspiration Roderick Civatte & Jessy Morocco  2021): Respiratory or GI disease Recommendations/Plan: Swallowing Evaluation Recommendations Swallowing Evaluation Recommendations Recommendations: PO diet PO Diet Recommendation: Regular; Thin liquids (Level 0) Liquid Administration via: Cup; Straw Medication Administration: Whole meds with liquid Supervision: Patient able to self-feed Swallowing strategies  : Minimize environmental distractions; Slow rate; Small bites/sips Postural changes: Position pt fully upright for meals Oral care recommendations: Oral care BID (2x/day) Treatment Plan Treatment Plan Treatment recommendations: No treatment recommended at this time Follow-up recommendations: No SLP follow up Functional status assessment: Patient has not had a recent decline in their functional status. Recommendations Recommendations for follow up therapy are one component of a multi-disciplinary discharge planning process, led by the attending physician.  Recommendations may be updated based on patient status, additional functional criteria and insurance authorization. Assessment: Orofacial Exam: Orofacial Exam Oral Cavity: Oral Hygiene: WFL Oral Cavity - Dentition: Missing dentition (one lower tooth) Orofacial Anatomy: WFL Oral Motor/Sensory Function: WFL Anatomy: Anatomy: Suspected cervical osteophytes Boluses Administered: Boluses Administered Boluses Administered: Thin liquids (Level 0); Mildly thick liquids (Level 2, nectar thick); Moderately thick liquids (Level 3, honey thick); Puree; Solid  Oral Impairment Domain: Oral Impairment Domain Lip Closure: No labial escape Tongue control during bolus hold: Cohesive bolus between tongue to palatal seal Bolus preparation/mastication: Timely and efficient chewing and mashing Bolus transport/lingual motion: Brisk tongue motion Oral residue: Complete oral clearance Location of oral residue : N/A Initiation of pharyngeal swallow : Posterior laryngeal surface of the epiglottis  Pharyngeal Impairment Domain:  Pharyngeal Impairment Domain Soft palate elevation: No bolus between soft palate (SP)/pharyngeal wall (PW) Laryngeal elevation: Complete superior movement of thyroid  cartilage with complete approximation of arytenoids to epiglottic petiole Anterior hyoid excursion: Complete anterior movement Epiglottic movement: Complete inversion Laryngeal vestibule closure: Complete, no air/contrast in laryngeal vestibule Pharyngeal stripping wave : Present - complete Pharyngeal contraction (A/P view only): N/A Pharyngoesophageal segment opening: Complete distension and complete duration, no obstruction of flow Tongue base retraction: No contrast between tongue base and posterior pharyngeal wall (PPW) Pharyngeal residue: Complete pharyngeal clearance Location of pharyngeal residue: N/A  Esophageal Impairment Domain: Esophageal Impairment Domain Esophageal clearance upright position: Complete clearance, esophageal coating Pill: Pill Consistency administered: Thin liquids (Level 0) Thin liquids (Level 0): [facility] Penetration/Aspiration Scale Score: Penetration/Aspiration Scale Score 1.  Material does not enter airway: Mildly thick liquids (Level 2, nectar thick); Moderately thick liquids (Level 3, honey thick); Puree; Solid; Pill 3.  Material enters airway, remains ABOVE vocal cords and not ejected out: Thin liquids (Level 0) Compensatory Strategies: Compensatory Strategies Compensatory strategies: Yes Straw: Effective Effective Straw: Thin liquid (Level 0); Mildly thick liquid (Level 2, nectar thick)   General Information: Caregiver present: Yes  Diet Prior to this Study: Regular; Thin liquids (Level 0)   Temperature : Normal   Respiratory Status: WFL   Supplemental O2: Nasal cannula (15LPM)   History of Recent Intubation: No  Behavior/Cognition: Alert; Cooperative; Pleasant mood Self-Feeding Abilities: Able to self-feed Baseline vocal quality/speech: Normal Volitional Cough: Able to elicit Volitional Swallow: Able to elicit Exam  Limitations: No limitations Goal Planning: Prognosis for improved oropharyngeal function: Good Barriers to Reach Goals: Time post onset No data recorded Patient/Family Stated Goal: none  stated Consulted and agree with results and recommendations: Patient; Nurse Pain: Pain Assessment Pain Assessment: No/denies pain End of Session: Start Time:SLP Start Time (ACUTE ONLY): 0955 Stop Time: SLP Stop Time (ACUTE ONLY): 1007 Time Calculation:SLP Time Calculation (min) (ACUTE ONLY): 12 min Charges: SLP Evaluations $ SLP Speech Visit: 1 Visit SLP Evaluations $BSS Swallow: 1 Procedure $MBS Swallow: 1 Procedure SLP visit diagnosis: SLP Visit Diagnosis: Dysphagia, oropharyngeal phase (R13.12) Past Medical History: Past Medical History: Diagnosis Date  Asthma 04/10/1997  CKD (chronic kidney disease), 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  COPD (chronic obstructive pulmonary disease) (HCC)   on 4L home O2  Essential hypertension   Gout   Thrombocytopenia (HCC) 07/28/2012 Past Surgical History: Past Surgical History: Procedure Laterality Date  FRACTURE SURGERY  childhood  right arm  KNEE SURGERY    right knee s/p trauma Amil Kale, M.A., CCC-SLP Speech Language Pathology, Acute Rehabilitation Services Secure Chat preferred [phone] 08/03/2023, 10:40 AM  CT Angio Chest Pulmonary Embolism (PE) W or WO Contrast Result Date: 07/31/2023 CLINICAL DATA:  Respiratory distress EXAM: CT ANGIOGRAPHY CHEST WITH CONTRAST TECHNIQUE: Multidetector CT imaging of the chest was performed using the standard protocol during bolus administration of intravenous contrast. Multiplanar CT image reconstructions and MIPs were obtained to evaluate the vascular anatomy. RADIATION DOSE REDUCTION: This exam was performed according to the departmental dose-optimization program which includes automated exposure control, adjustment of the mA and/or kV according to patient size and/or use of iterative reconstruction technique. CONTRAST:  75mL OMNIPAQUE  IOHEXOL  350 MG/ML SOLN  COMPARISON:  Radiograph 07/31/2023 and CT chest 10/30/2018 FINDINGS: Cardiovascular: No pericardial effusion. Normal caliber thoracic aorta. Coronary artery atherosclerotic calcification. Negative for acute pulmonary embolism. The right main pulmonary artery measures 29 mm and the left measures 31 mm. This can be seen with pulmonary hypertension. Mediastinum/Nodes: Trachea and esophagus are unremarkable. Mediastinal and hilar lymphadenopathy is new since 10/30/2018. For example 1.4 cm right hilar node on series 5/image 106; 2.1 cm left hilar node on series 5/image 95; and 1.2 cm left paratracheal node on 5/62. Lungs/Pleura: Advanced emphysema. Increased ground-glass opacities and interlobular septal thickening in the lower lungs compared to 10/30/2018. New peripheral consolidation in the posterior right lower lobe. No pleural effusion or pneumothorax. 10 mm nodule in the right upper lobe (series 6/image 54). Irregular 2.1 x 1.8 cm nodule in the lingula (6/97). Upper Abdomen: Stable thickening in the left adrenal gland. Unchanged hypoattenuation along the falciform ligament likely due to focal fatty infiltration. No acute abnormality. Musculoskeletal: No acute fracture or destructive osseous lesion. Review of the MIP images confirms the above findings. IMPRESSION: 1. Negative for acute pulmonary embolism. 2. Lower lung pneumonia or edema superimposed on a background of advanced emphysema. 3. Irregular 2.1 cm nodule in the lingula and 10 mm nodule in the right upper lobe. Given associated mediastinal and hilar lymphadenopathy these are concerning for malignancy. PET/CT or tissue sampling is recommended. This recommendation follows the consensus statement: Guidelines for Management of Incidental Pulmonary Nodules Detected on CT Images: From the Fleischner Society 2017; Radiology 2017; 284:228-243. 4. Dilated pulmonary arteries compatible with pulmonary hypertension. Aortic Atherosclerosis (ICD10-I70.0) and Emphysema  (ICD10-J43.9). Electronically Signed   By: Rozell Cornet M.D.   On: 07/31/2023 18:34   DG Chest Port 1 View Result Date: 07/31/2023 CLINICAL DATA:  Shortness of breath EXAM: PORTABLE CHEST 1 VIEW COMPARISON:  June 13, 2023 FINDINGS: Chronic interstitial lung disease with bilateral basilar interstitial infiltrates likely consistent with chronic interstitial lung changes and COPD of both upper lobes without  evidence of acute infiltrates consolidations or pulmonary edema Heart and mediastinum normal No pleural effusions IMPRESSION: No acute cardiopulmonary disease. Electronically Signed   By: Fredrich Jefferson M.D.   On: 07/31/2023 13:56    Micro Results     Recent Results (from the past 240 hours)  Resp panel by RT-PCR (RSV, Flu A&B, Covid) Anterior Nasal Swab     Status: None   Collection Time: 07/31/23 12:00 PM   Specimen: Anterior Nasal Swab  Result Value Ref Range Status   SARS Coronavirus 2 by RT PCR NEGATIVE NEGATIVE Final   Influenza A by PCR NEGATIVE NEGATIVE Final   Influenza B by PCR NEGATIVE NEGATIVE Final    Comment: (NOTE) The Xpert Xpress SARS-CoV-2/FLU/RSV plus assay is intended as an aid in the diagnosis of influenza from Nasopharyngeal swab specimens and should not be used as a sole basis for treatment. Nasal washings and aspirates are unacceptable for Xpert Xpress SARS-CoV-2/FLU/RSV testing.  Fact Sheet for Patients: BloggerCourse.com  Fact Sheet for Healthcare Providers: SeriousBroker.it  This test is not yet approved or cleared by the [place]  FDA and has been authorized for detection and/or diagnosis of SARS-CoV-2 by FDA under an Emergency Use Authorization (EUA). This EUA will remain in effect (meaning this test can be used) for the duration of the COVID-19 declaration under Section 564(b)(1) of the Act, 21 U.S.C. section 360bbb-3(b)(1), unless the authorization is terminated or revoked.     Resp Syncytial  Virus by PCR NEGATIVE NEGATIVE Final    Comment: (NOTE) Fact Sheet for Patients: BloggerCourse.com  Fact Sheet for Healthcare Providers: SeriousBroker.it  This test is not yet approved or cleared by the [place]  FDA and has been authorized for detection and/or diagnosis of SARS-CoV-2 by FDA under an Emergency Use Authorization (EUA). This EUA will remain in effect (meaning this test can be used) for the duration of the COVID-19 declaration under Section 564(b)(1) of the Act, 21 U.S.C. section 360bbb-3(b)(1), unless the authorization is terminated or revoked.  Performed at [facility], [address], [place], [place] [address]     Today   Subjective    Kevonne Diefenbach today has no headache,no chest abdominal pain,no new weakness tingling or numbness, feels much better wants to go home today.     Objective   Blood pressure (!) 139/92, pulse 83, temperature 97.8 F (36.6 C), temperature source Axillary, resp. rate (!) 23, height 5\' 6"  (1.676 m), weight 84 kg, SpO2 96%.   Intake/Output Summary (Last 24 hours) at 08/07/2023 0740 Last data filed at 08/06/2023 1420 Gross per 24 hour  Intake 360 ml  Output 500 ml  Net -140 ml    Exam  Awake Alert, No new F.N deficits,    [place].AT,PERRAL Supple Neck,   Symmetrical Chest wall movement, Mod air movement bilaterally, CTAB RRR,No Gallops,   +ve B.Sounds, Abd Soft, Non tender,  No Cyanosis, Clubbing or edema    Data Review   Recent Labs  Lab 08/02/23 0454 08/03/23 0442 08/04/23 0442 08/05/23 0611 08/06/23 0439  WBC 18.6* 16.7* 16.7* 17.0* 16.2*  HGB 11.2* 11.8* 12.3* 12.4* 11.8*  HCT 36.6* 38.1* 40.5 41.1 39.0  PLT 316 324 335 352 327  MCV 75.2* 74.7* 74.6* 75.1* 75.7*  MCH 23.0* 23.1* 22.7* 22.7* 22.9*  MCHC 30.6 31.0 30.4 30.2 30.3  RDW 13.5 13.7 13.9 13.9 14.0  LYMPHSABS 2.1 2.6 3.1 3.8 3.2  MONOABS 1.7* 1.7* 2.0* 1.9* 1.9*  EOSABS 0.0 0.0 0.1 0.1 0.2  BASOSABS 0.0 0.0 0.0 0.1 0.0    Recent Labs  Lab 07/31/23 1200 07/31/23 1212 07/31/23 1450 07/31/23 1840 08/01/23 0500 08/02/23 0454 08/02/23 0523 08/03/23 0442 08/04/23 0442 08/05/23 0611 08/06/23 0439  NA 137   < >  --   --   --  138  --  136 138 137 138  K 3.7   < >  --   --   --  4.1  --  3.9 3.9 3.9 3.9  CL 96*  --   --   --   --  98  --  96* 97* 96* 98  CO2 30  --   --   --   --  31  --  29 32 32 31  ANIONGAP 11  --   --   --   --  9  --  11 9 9 9   GLUCOSE 130*  --   --   --   --  89  --  76 79 76 81  BUN 10  --   --   --   --  16  --  17 16 17 17   CREATININE 1.16  --   --   --   --  1.17  --  1.06 1.14 1.20 1.13  CRP  --   --   --   --   --   --  1.3* 0.8 0.6 0.6 0.5  PROCALCITON <0.10  --   --   --   --   --  0.44 0.18 <0.10 <0.10 0.14  LATICACIDVEN  --   --  3.6* 3.7*  --   --   --   --   --   --   --   TSH 2.632  --   --   --   --   --   --   --   --   --   --   BNP 32.5  --   --   --  58.2  --   --  53.2 32.5 29.7 18.9  MG  --   --   --   --   --  2.2  --  2.2 2.2 2.2 2.2  CALCIUM  9.3  --   --   --   --  9.2  --  9.3 9.2 9.5 9.3   < > = values in this interval not displayed.    Total Time in preparing paper work, data evaluation and todays exam - 35 minutes  Signature  -    Lynnwood Sauer M.D on 08/07/2023 at 7:40 AM   -  To page go to [email or web address]

## 2023-08-07 NOTE — Plan of Care (Signed)
   Problem: Education: Goal: Knowledge of General Education information will improve Description: Including pain rating scale, medication(s)/side effects and non-pharmacologic comfort measures Outcome: Completed/Met   Problem: Health Behavior/Discharge Planning: Goal: Ability to manage health-related needs will improve Outcome: Completed/Met   Problem: Clinical Measurements: Goal: Ability to maintain clinical measurements within normal limits will improve Outcome: Completed/Met Goal: Will remain free from infection Outcome: Completed/Met Goal: Diagnostic test results will improve Outcome: Completed/Met Goal: Respiratory complications will improve Outcome: Completed/Met Goal: Cardiovascular complication will be avoided Outcome: Completed/Met   Problem: Activity: Goal: Risk for activity intolerance will decrease Outcome: Completed/Met   Problem: Nutrition: Goal: Adequate nutrition will be maintained Outcome: Completed/Met   Problem: Coping: Goal: Level of anxiety will decrease Outcome: Completed/Met   Problem: Elimination: Goal: Will not experience complications related to bowel motility Outcome: Completed/Met Goal: Will not experience complications related to urinary retention Outcome: Completed/Met   Problem: Pain Managment: Goal: General experience of comfort will improve and/or be controlled Outcome: Completed/Met   Problem: Safety: Goal: Ability to remain free from injury will improve Outcome: Completed/Met   Problem: Skin Integrity: Goal: Risk for impaired skin integrity will decrease Outcome: Completed/Met   Problem: Education: Goal: Knowledge of disease or condition will improve Outcome: Completed/Met Goal: Knowledge of the prescribed therapeutic regimen will improve Outcome: Completed/Met Goal: Individualized Educational Video(s) Outcome: Completed/Met   Problem: Activity: Goal: Ability to tolerate increased activity will improve Outcome:  Completed/Met Goal: Will verbalize the importance of balancing activity with adequate rest periods Outcome: Completed/Met   Problem: Respiratory: Goal: Ability to maintain a clear airway will improve Outcome: Completed/Met Goal: Levels of oxygenation will improve Outcome: Completed/Met Goal: Ability to maintain adequate ventilation will improve Outcome: Completed/Met

## 2023-08-07 NOTE — Plan of Care (Signed)

## 2023-08-07 NOTE — TOC Transition Note (Addendum)
 Transition of Care [facility]) - Discharge Note   Patient Details  Name: [name] [name] MRN: [id number] Date of Birth: Jul 02, 1960  Transition of [facility]) CM/SW Contact:  Eusebio High, RN Phone Number: 08/07/2023, 9:03 AM   Clinical Narrative:     Patient will DC to home today  [facility] will resume Home Health. DME has been delivered to room. Patient states Daughter will transport and bring portable oxygen  but he hasn't been able to reach her on the phone yet- She works nights so she must be sleeping, per patient. RN will continue to follow up.   Patient can leave once Daughter has been contacted and confirms she will bring portable O2   Update 9:24 AM Patient just called me His Daughter is on way to pick up and is bringing her portable O2 Patient states she will be waiting downstairs in car-  Floor RN notified This RNCM also offered to assist in getting patient to pick up            Patient Goals and CMS Choice            Discharge Placement                       Discharge Plan and Services Additional resources added to the After Visit Summary for                  DME Arranged: Bedside commode DME Agency: [employer] Date DME Agency Contacted: 08/03/23 Time DME Agency Contacted: 1630 Representative spoke with at DME Agency: Zula Hitch            Social Drivers of Health (SDOH) Interventions SDOH Screenings   Food Insecurity: No Food Insecurity (08/01/2023)  Housing: Low Risk  (08/01/2023)  Transportation Needs: Unmet Transportation Needs (08/01/2023)  Utilities: At Risk (08/01/2023)  Depression (PHQ2-9): Low Risk  (05/25/2022)  Tobacco Use: Medium Risk (07/31/2023)     Readmission Risk Interventions    06/20/2023    4:32 PM  Readmission Risk Prevention Plan  Post Dischage Appt Complete  Medication Screening Complete  Transportation Screening Complete

## 2023-08-07 NOTE — Progress Notes (Signed)
 PT Cancellation Note  Patient Details Name: Cap Wernicke MRN: [id number] DOB: Apr 01, 1961   Cancelled Treatment:    Reason Eval/Treat Not Completed: Other (comment) (" Im going home today.  I am saving my energy."  Pt declined getting up and feels comfortable going home.  Has equipment per pt.)   Florencia Hunter 08/07/2023, 9:06 AM Rivers Hamrick M,PT Acute Rehab Services [phone]

## 2023-08-08 ENCOUNTER — Telehealth: Payer: Self-pay

## 2023-08-08 DIAGNOSIS — U071 COVID-19: Secondary | ICD-10-CM | POA: Diagnosis not present

## 2023-08-08 DIAGNOSIS — J449 Chronic obstructive pulmonary disease, unspecified: Secondary | ICD-10-CM | POA: Diagnosis not present

## 2023-08-08 NOTE — Transitions of Care (Post Inpatient/ED Visit) (Signed)
 08/08/2023  Name: [name] [name]. MRN: [id number] DOB: [birth year]  Today's TOC FU Call Status: Today's TOC FU Call Status:: Successful TOC FU Call Completed TOC FU Call Complete Date: 08/08/23 Patient's Name and Date of Birth confirmed.  Transition Care Management Follow-up Telephone Call Date of Discharge: 08/07/23 Discharge Facility: [facility]) Type of Discharge: Inpatient Admission Primary Inpatient Discharge Diagnosis:: Acute on chronic respiratory failure with hypoxia (HCC) due to combination of pneumonia possible community How have you been since you were released from the hospital?: Better Any questions or concerns?: No  Items Reviewed: Did you receive and understand the discharge instructions provided?: Yes Medications obtained,verified, and reconciled?: Yes (Medications Reviewed) Any new allergies since your discharge?: No Dietary orders reviewed?: Yes Type of Diet Ordered:: Heart Healthy, Low salt Do you have support at home?: Yes People in Home [RPT]: child(ren), adult, grandchild(ren) Name of Support/Comfort Primary Source: [name]  Medications Reviewed Today: Medications Reviewed Today     Reviewed by [name], RN (Case Manager) on 08/08/23 at [phone]  Med List Status: <None>   Medication Order Taking? Sig Documenting Provider Last Dose Status Informant  albuterol  (PROVENTIL ) (2.5 MG/3ML) 0.083% nebulizer solution [id number] Yes TAKE 3 MLS (2.5 MG TOTAL) BY NEBULIZATION EVERY 6 (SIX) HOURS AS NEEDED FOR SHORTNESS OF BREATH. [name], PA-C Taking Active Self  albuterol  (VENTOLIN  HFA) 108 (90 Base) MCG/ACT inhaler [id number] Yes Inhale 2 puffs into the lungs every 6 (six) hours as needed for wheezing or shortness of breath [name], [place] Taking Active Self    Discontinued 07/03/11 1455 (Entry Error)   allopurinol  (ZYLOPRIM ) 100 MG tablet [id number] Yes Take 1 tablet (100 mg total) by mouth daily. [name], PA-C Taking Active Self   amLODipine  (NORVASC ) 10 MG tablet [id number] Yes Take 1 tablet (10 mg total) by mouth daily. [name], PA-C Taking Active Self  Ascorbic Acid  (VITAMIN C ) 1000 MG tablet [id number] Yes Take 1,000 mg by mouth daily. [provider] Taking Active Self  azithromycin  (ZITHROMAX ) 500 MG tablet [id number] Yes Take 1 tablet (500 mg total) by mouth daily. Singh, Prashant K, MD Taking Active   budesonide -formoterol  (SYMBICORT ) 160-4.5 MCG/ACT inhaler [id number] Yes Inhale 2 puffs into the lungs 2 (two) times daily. Mannam, Praveen, MD Taking Active Self  ezetimibe  (ZETIA ) 10 MG tablet [id number] Yes Take 1 tablet (10 mg total) by mouth daily. [name], PA-C Taking Active Self  ibuprofen  (ADVIL ) 200 MG tablet [id number] Yes Take 800 mg by mouth daily as needed (gout pain,swelling). [provider] Taking Active Self  loperamide (IMODIUM) 2 MG capsule [id number] Yes Take 2 capsules (4 mg total) by mouth every 6 (six) hours as needed for diarrhea or loose stools. Singh, Prashant K, MD Taking Active   metoprolol  succinate (TOPROL -XL) 50 MG 24 hr tablet [id number] Yes Take 1 tablet (50 mg total) by mouth daily. Newlin, Enobong, MD Taking Active Self  predniSONE  (DELTASONE ) 5 MG tablet [id number] Yes Take by mouth: SIX tablets daily for 5 days, then FOUR tablets daily for 5 days, then TWO tablets daily for 3 days, then ONE tablet daily for 3 days, then ONE HALF tablet daily for 3 days, then STOP. Singh, Prashant K, MD Taking Active   thiamine  (VITAMIN B-1) 100 MG tablet [id number] Yes Take 1 tablet (100 mg total) by mouth daily. Singh, Prashant K, MD Taking Active   tiotropium (SPIRIVA  HANDIHALER) 18 MCG inhalation capsule [id number] Yes PLACE 1 CAPSULE (18 MCG TOTAL) INTO INHALER  AND INHALE DAILY. [name], PA-C Taking Active Self  vitamin B-12 (CYANOCOBALAMIN ) 500 MCG tablet [id number] Yes Take 500 mcg by mouth daily. [provider] Taking Active Self  VITAMIN D PO  [id number] Yes Take 1 tablet by mouth daily. [provider] Taking Active Self            Home Care and Equipment/Supplies: Were Home Health Services Ordered?: Yes Name of Home Health Agency:: [facility] Has Agency set up a time to come to your home?: No Any new equipment or medical supplies ordered?: Yes Name of Medical supply agency?: [employer] Were you able to get the equipment/medical supplies?: Yes Do you have any questions related to the use of the equipment/supplies?: No  Functional Questionnaire: Do you need assistance with bathing/showering or dressing?: No Do you need assistance with meal preparation?: Yes (Family assists) Do you need assistance with eating?: No Do you have difficulty maintaining continence: No Do you need assistance with getting out of bed/getting out of a chair/moving?: No Do you have difficulty managing or taking your medications?: No  Follow up appointments reviewed: PCP Follow-up appointment confirmed?: No (patient to call office) MD Provider Line Number:[phone] Given: No Specialist Hospital Follow-up appointment confirmed?: Yes Date of Specialist follow-up appointment?: 09/19/23 (Patient to call for closer appointment) Follow-Up Specialty Provider:: Dr. Waylan Haggard Do you need transportation to your follow-up appointment?: No Do you understand care options if your condition(s) worsen?: Yes-patient verbalized understanding  SDOH Interventions Today    Flowsheet Row Most Recent Value  SDOH Interventions   Food Insecurity Interventions Intervention Not Indicated  Housing Interventions Intervention Not Indicated  Transportation Interventions Intervention Not Indicated  Utilities Interventions Intervention Not Indicated      Patient reports he feels better since being at home.  Oxygen  at 10 liters per patient as he reports this is his normal.   Discussed his COPD and recent pneumonia and when to notify physician or return back to the  hospital.  He does not have follow up with PCP or pulmonary at this time.  He declined CM assistance to set appointments.  He states his daughter will make sure he gets to appointments.  Palliative Care to follow as outpatient. Patient agreeable to follow up calls for TOC.  Scheduled with primary nurse.    Spoke with [facility]. They report that they do not have orders presently and that they discharged patient on 07-10-23.  Contacted inpatient care manager Kela Patch, RN. She states she will contact home health with new orders.     Liba Hulsey J. Laurie Lovejoy RN, MSN [facility], [facility] Health RN Care Manager Direct Dial: [phone]  Fax: [phone] Website: [email or web address]

## 2023-08-08 NOTE — Patient Instructions (Signed)
 Visit Information  Thank you for taking time to visit with me today. Please don't hesitate to contact me if I can be of assistance to you before our next scheduled telephone appointment.  Your next appointment is by telephone on 08/16/23 at 0900 am  Following is a copy of your care plan:   Goals Addressed             This Visit's Progress    VBCI Transitions of Care (TOC) Care Plan       Problems:  Recent Hospitalization for treatment of COPD Knowledge  Goal:  Over the next 30 days, the patient will not experience hospital readmission  Interventions:  Transitions of Care: Doctor Visits  - discussed the importance of doctor visits Reviewed Signs and symptoms of infection  COPD Interventions: Provided patient with basic written and verbal COPD education on self care/management/and exacerbation prevention Advised patient to track and manage COPD triggers Provided instruction about proper use of medications used for management of COPD including inhalers Provided education about and advised patient to utilize infection prevention strategies to reduce risk of respiratory infection Discussed the importance of adequate rest and management of fatigue with COPD  Patient Self Care Activities:  Attend all scheduled provider appointments Call pharmacy for medication refills 3-7 days in advance of running out of medications Call provider office for new concerns or questions  Notify RN Care Manager of TOC call rescheduling needs Take medications as prescribed   eliminate smoking in my home do breathing exercises every day keep follow-up appointments: Call PCP and Lung Doctor for follow up appointments  Plan:  Telephone follow up appointment with care management team member scheduled for:  08/16/23 @9 :00 am        Patient verbalizes understanding of instructions and care plan provided today and agrees to view in MyChart. Active MyChart status and patient understanding of how to access  instructions and care plan via MyChart confirmed with patient.     The patient has been provided with contact information for the care management team and has been advised to call with any health related questions or concerns.   Please call the care guide team at [phone] if you need to cancel or reschedule your appointment.   Please call the Suicide and Crisis Lifeline: 988 if you are experiencing a Mental Health or Behavioral Health Crisis or need someone to talk to.  Arty Lantzy J. Nasir Bright RN, MSN [facility], [facility] Health RN Care Manager Direct Dial: [phone]  Fax: [phone] Website: [email or web address]

## 2023-08-08 NOTE — Transitions of Care (Post Inpatient/ED Visit) (Signed)
 08/08/2023  Patient ID: [name] Dakins., male   DOB: 1960/10/03, 63 y.o.   MRN: [id number]  1:24 pm Telephone call back to [facility] for follow up on Home health.  They are working to get start of care and will contact patient.    1:33 pm  Telephone call to patient to let him know that [facility] be contacting him to start home health.  He verbalized understanding.    Jazzlene Huot J. Providence Stivers RN, MSN [facility], [facility] Health RN Care Manager Direct Dial: [phone]  Fax: [phone] Website: [email or web address]

## 2023-08-09 ENCOUNTER — Telehealth: Payer: Self-pay | Admitting: *Deleted

## 2023-08-09 NOTE — Telephone Encounter (Signed)
 Copied from CRM [id number]. Topic: Appointments - Scheduling Inquiry for Clinic >> Aug 09, 2023 12:29 PM [name] [name] wrote: Reason for CRM: Pt is calling to see if his hfu appt can be switched from in person to virtual. Appt is scheduled currently for 09/19/2023 at 1030 with Dr. Waylan Haggard. Pt stated the appt was initially a virtual visit. Please call at [phone] ok to leave vm.  Spoke with the pt  I advised since the last visit was virtual, and he has had hospitalization since, it's preferred he come in person  He states this is okay, and confirmed that his daughter will be bringing him to this visit 09/19/23  Nothing further needed

## 2023-08-13 ENCOUNTER — Telehealth: Payer: Self-pay | Admitting: Family Medicine

## 2023-08-13 NOTE — Telephone Encounter (Signed)
 Copied from CRM [id number]. Topic: Clinical - Home Health Verbal Orders  >> Aug 13, 2023  8:46 AM Allison Arena wrote: Caller/Agency: Shelvy Dickens, RN with [facility] Number: [phone] Service Requested: Skilled Nursing Frequency: 1x / week for 3 weeks, then every other week for 6 weeks - for cardio / pulmonary assessment. Any new concerns about the patient? No

## 2023-08-15 ENCOUNTER — Other Ambulatory Visit: Payer: Self-pay | Admitting: Family Medicine

## 2023-08-15 ENCOUNTER — Other Ambulatory Visit: Payer: Self-pay

## 2023-08-15 DIAGNOSIS — I10 Essential (primary) hypertension: Secondary | ICD-10-CM

## 2023-08-16 ENCOUNTER — Other Ambulatory Visit: Payer: Self-pay | Admitting: *Deleted

## 2023-08-16 NOTE — Transitions of Care (Post Inpatient/ED Visit) (Signed)
 Transition of Care week 2  Visit Note  08/16/2023  Name: [name] [name]. MRN: [id number]          DOB: 31-Aug-1960  Situation: Patient enrolled in [facility] 30-day program. Visit completed with Mr. Wehe by telephone.   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   COPD (chronic obstructive pulmonary disease) (HCC)    on 4L home O2   Essential hypertension    Gout    Thrombocytopenia (HCC) 07/28/2012    Assessment: Patient Reported Symptoms: Cognitive Cognitive Status: Alert and oriented to person, place, and time, Insightful and able to interpret abstract concepts, Normal speech and language skills, Able to follow simple commands   Health Maintenance Behaviors: Annual physical exam  Neurological Neurological Review of Symptoms: No symptoms reported    HEENT HEENT Symptoms Reported: No symptoms reported      Cardiovascular Cardiovascular Symptoms Reported: No symptoms reported    Respiratory Respiratory Symptoms Reported: No symptoms reported Additional Respiratory Details: Patient using oxygen  at 10 liters. Feeling much better Respiratory Conditions: COPD Respiratory Self-Management Outcome: 4 (good)  Endocrine Patient reports the following symptoms related to hypoglycemia or hyperglycemia : No symptoms reported    Gastrointestinal Gastrointestinal Symptoms Reported: No symptoms reported      Genitourinary Genitourinary Symptoms Reported: No symptoms reported    Integumentary Integumentary Symptoms Reported: No symptoms reported    Musculoskeletal Musculoskelatal Symptoms Reviewed: Difficulty walking Additional Musculoskeletal Details: utilizing walker for mobility        Psychosocial Psychosocial Symptoms Reported: No symptoms reported     Quality of Family Relationships: involved, helpful, supportive   There were no  vitals filed for this visit.  Medications Reviewed Today     Reviewed by Aura Leeds, RN (Registered Nurse) on 08/16/23 at [phone]  Med List Status: <None>   Medication Order Taking? Sig Documenting Provider Last Dose Status Informant  albuterol  (PROVENTIL ) (2.5 MG/3ML) 0.083% nebulizer solution [id number] Yes TAKE 3 MLS (2.5 MG TOTAL) BY NEBULIZATION EVERY 6 (SIX) HOURS AS NEEDED FOR SHORTNESS OF BREATH. [name], PA-C Taking Active Self  albuterol  (VENTOLIN  HFA) 108 (90 Base) MCG/ACT inhaler [id number] Yes Inhale 2 puffs into the lungs every 6 (six) hours as needed for wheezing or shortness of breath [name], [place] Taking Active Self    Discontinued 07/03/11 1455 (Entry Error)   allopurinol  (ZYLOPRIM ) 100 MG tablet [id number] Yes Take 1 tablet (100 mg total) by mouth daily. [name], PA-C Taking Active Self  amLODipine  (NORVASC ) 10 MG tablet [id number] Yes Take 1 tablet (10 mg total) by mouth daily. [name], PA-C Taking Active Self  Ascorbic Acid  (VITAMIN C ) 1000 MG tablet [id number] Yes Take 1,000 mg by mouth daily. [provider] Taking Active Self  azithromycin  (ZITHROMAX ) 500 MG tablet [id number] No Take 1 tablet (500 mg total) by mouth daily.  Patient not taking: Reported on 08/16/2023   Singh, Prashant K, MD Not Taking Active            Med Note (Ariane Ditullio A   Thu Aug 16, 2023  9:23 AM) Completed   budesonide -formoterol  (SYMBICORT ) 160-4.5 MCG/ACT inhaler [id number] Yes Inhale 2 puffs into the lungs 2 (two) times daily. Mannam, Praveen, MD Taking Active Self  ezetimibe  (ZETIA ) 10 MG tablet [id number] Yes Take 1 tablet (10 mg total) by mouth daily. Dulce Gibbs  M, PA-C Taking Active Self  ibuprofen  (ADVIL ) 200 MG tablet [id number] Yes Take 800 mg by mouth daily as needed (gout pain,swelling). [provider] Taking Active Self  loperamide  (IMODIUM ) 2 MG capsule [id number] Yes Take 2 capsules (4 mg total) by mouth every 6 (six) hours as  needed for diarrhea or loose stools. Singh, Prashant K, MD Taking Active   metoprolol  succinate (TOPROL -XL) 50 MG 24 hr tablet [id number] Yes Take 1 tablet (50 mg total) by mouth daily. Newlin, Enobong, MD Taking Active Self  predniSONE  (DELTASONE ) 5 MG tablet [id number] Yes Take by mouth: SIX tablets daily for 5 days, then FOUR tablets daily for 5 days, then TWO tablets daily for 3 days, then ONE tablet daily for 3 days, then ONE HALF tablet daily for 3 days, then STOP. Cala Castleman, MD Taking Active   thiamine  (VITAMIN B-1) 100 MG tablet [id number] Yes Take 1 tablet (100 mg total) by mouth daily. Singh, Prashant K, MD Taking Active   tiotropium (SPIRIVA  HANDIHALER) 18 MCG inhalation capsule [id number] Yes PLACE 1 CAPSULE (18 MCG TOTAL) INTO INHALER AND INHALE DAILY. [name], PA-C Taking Active Self  vitamin B-12 (CYANOCOBALAMIN ) 500 MCG tablet [id number] Yes Take 500 mcg by mouth daily. [provider] Taking Active Self  VITAMIN D PO [id number] Yes Take 1 tablet by mouth daily. [provider] Taking Active Self            Recommendation:   PCP Follow-up  Follow Up Plan:   Telephone follow-up in 1 week  Arna Better RN, BSN [place]  [facility] RN Care Manager [phone]

## 2023-08-16 NOTE — Telephone Encounter (Signed)
Verbal order given for patient.

## 2023-08-16 NOTE — Patient Instructions (Signed)
 Visit Information  Thank you for taking time to visit with me today. Please don't hesitate to contact me if I can be of assistance to you before our next scheduled telephone appointment.  Our next appointment is by telephone on 08/23/23 at 9am  Following is a copy of your care plan:   Goals Addressed             This Visit's Progress    VBCI Transitions of Care (TOC) Care Plan       Problems:  Recent Hospitalization for treatment of COPD Knowledge  Goal:  Over the next 30 days, the patient will not experience hospital readmission  Interventions:  Transitions of Care: Doctor Visits  - discussed the importance of doctor visits Reviewed Signs and symptoms of infection Contacted provider office to request hospital follow up Ensured [facility] involved-patient reports HH came out for evaluation, unsure of next home visit, per EMR skilled nursing requested  COPD Interventions: Provided patient with basic written and verbal COPD education on self care/management/and exacerbation prevention Advised patient to track and manage COPD triggers Provided instruction about proper use of medications used for management of COPD including inhalers Advised patient to engage in light exercise as tolerated 3-5 days a week to aid in the the management of COPD Discussed the importance of adequate rest and management of fatigue with COPD Ensured patient has oxygen  and using as instructed Discussed Pulmonology follow up scheduled on 09/19/23, advised to inquire about earlier appointment  Patient Self Care Activities:  Attend all scheduled provider appointments Call pharmacy for medication refills 3-7 days in advance of running out of medications Call provider office for new concerns or questions  Notify RN Care Manager of [facility] call rescheduling needs Take medications as prescribed   eliminate smoking in my home do breathing exercises every day keep follow-up appointments: Call PCP and Lung Doctor for  earlier follow up appointments  Plan:  Telephone follow up appointment with care management team member scheduled for:  08/23/23 @9 :00 am        Patient verbalizes understanding of instructions and care plan provided today and agrees to view in MyChart. Active MyChart status and patient understanding of how to access instructions and care plan via MyChart confirmed with patient.     Telephone follow up appointment with care management team member scheduled for:08/23/23 at 9am  Please call the care guide team at [phone] if you need to cancel or reschedule your appointment.   Please call 1-800-273-TALK (toll free, 24 hour hotline) go to [facility] Urgent [facility] [address], [place] [phone]) call 911 if you are experiencing a Mental Health or Behavioral Health Crisis or need someone to talk to.  Arna Better RN, BSN [place]  [facility] Health RN Care Manager [phone]

## 2023-08-17 ENCOUNTER — Other Ambulatory Visit: Payer: Self-pay

## 2023-08-22 ENCOUNTER — Other Ambulatory Visit: Payer: Self-pay

## 2023-08-23 ENCOUNTER — Other Ambulatory Visit: Payer: Self-pay

## 2023-08-23 ENCOUNTER — Encounter: Payer: Self-pay | Admitting: *Deleted

## 2023-08-23 ENCOUNTER — Other Ambulatory Visit: Payer: Self-pay | Admitting: *Deleted

## 2023-08-23 DIAGNOSIS — J189 Pneumonia, unspecified organism: Secondary | ICD-10-CM | POA: Diagnosis not present

## 2023-08-23 DIAGNOSIS — G5603 Carpal tunnel syndrome, bilateral upper limbs: Secondary | ICD-10-CM | POA: Diagnosis not present

## 2023-08-23 DIAGNOSIS — E785 Hyperlipidemia, unspecified: Secondary | ICD-10-CM | POA: Diagnosis not present

## 2023-08-23 DIAGNOSIS — Z556 Problems related to health literacy: Secondary | ICD-10-CM | POA: Diagnosis not present

## 2023-08-23 DIAGNOSIS — Z8616 Personal history of COVID-19: Secondary | ICD-10-CM | POA: Diagnosis not present

## 2023-08-23 DIAGNOSIS — D696 Thrombocytopenia, unspecified: Secondary | ICD-10-CM | POA: Diagnosis not present

## 2023-08-23 DIAGNOSIS — J9621 Acute and chronic respiratory failure with hypoxia: Secondary | ICD-10-CM | POA: Diagnosis not present

## 2023-08-23 DIAGNOSIS — J440 Chronic obstructive pulmonary disease with acute lower respiratory infection: Secondary | ICD-10-CM | POA: Diagnosis not present

## 2023-08-23 DIAGNOSIS — Z6829 Body mass index (BMI) 29.0-29.9, adult: Secondary | ICD-10-CM | POA: Diagnosis not present

## 2023-08-23 DIAGNOSIS — E66812 Obesity, class 2: Secondary | ICD-10-CM | POA: Diagnosis not present

## 2023-08-23 DIAGNOSIS — I129 Hypertensive chronic kidney disease with stage 1 through stage 4 chronic kidney disease, or unspecified chronic kidney disease: Secondary | ICD-10-CM | POA: Diagnosis not present

## 2023-08-23 DIAGNOSIS — Z7951 Long term (current) use of inhaled steroids: Secondary | ICD-10-CM | POA: Diagnosis not present

## 2023-08-23 DIAGNOSIS — F17201 Nicotine dependence, unspecified, in remission: Secondary | ICD-10-CM | POA: Diagnosis not present

## 2023-08-23 DIAGNOSIS — M109 Gout, unspecified: Secondary | ICD-10-CM | POA: Diagnosis not present

## 2023-08-23 DIAGNOSIS — Z792 Long term (current) use of antibiotics: Secondary | ICD-10-CM | POA: Diagnosis not present

## 2023-08-23 DIAGNOSIS — Z9981 Dependence on supplemental oxygen: Secondary | ICD-10-CM | POA: Diagnosis not present

## 2023-08-23 DIAGNOSIS — Z7952 Long term (current) use of systemic steroids: Secondary | ICD-10-CM | POA: Diagnosis not present

## 2023-08-23 DIAGNOSIS — E46 Unspecified protein-calorie malnutrition: Secondary | ICD-10-CM | POA: Diagnosis not present

## 2023-08-23 DIAGNOSIS — N182 Chronic kidney disease, stage 2 (mild): Secondary | ICD-10-CM | POA: Diagnosis not present

## 2023-08-23 DIAGNOSIS — J441 Chronic obstructive pulmonary disease with (acute) exacerbation: Secondary | ICD-10-CM | POA: Diagnosis not present

## 2023-08-23 DIAGNOSIS — R911 Solitary pulmonary nodule: Secondary | ICD-10-CM | POA: Diagnosis not present

## 2023-08-23 DIAGNOSIS — I272 Pulmonary hypertension, unspecified: Secondary | ICD-10-CM | POA: Diagnosis not present

## 2023-08-23 NOTE — Patient Instructions (Signed)
 Visit Information  Thank you for taking time to visit with me today. Please don't hesitate to contact me if I can be of assistance to you before our next scheduled telephone appointment.  Our next appointment is by telephone on 08/29/23 at 11:15am  Following is a copy of your care plan:   Goals Addressed             This Visit's Progress    VBCI Transitions of Care (TOC) Care Plan       Problems:  Recent Hospitalization for treatment of COPD Knowledge  Goal:  Over the next 30 days, the patient will not experience hospital readmission  Interventions:  Transitions of Care: Doctor Visits  - discussed the importance of doctor visits Reviewed Signs and symptoms of infection Reviewed hospital follow up on 08/29/23 Ensured HH involved-PT arrived to patient's home during our call  COPD Interventions: Provided instruction about proper use of medications used for management of COPD including inhalers Advised patient to engage in light exercise as tolerated 3-5 days a week to aid in the the management of COPD Provided education about and advised patient to utilize infection prevention strategies to reduce risk of respiratory infection Discussed the importance of adequate rest and management of fatigue with COPD Screening for signs and symptoms of depression related to chronic disease state  Ensured patient has oxygen  and using as instructed Discussed Pulmonology follow up scheduled on 09/19/23 Discussed breathing exercises, patient reports using incentive spirometer several times each hour  Patient Self Care Activities:  Attend all scheduled provider appointments Call pharmacy for medication refills 3-7 days in advance of running out of medications Call provider office for new concerns or questions  Notify RN Care Manager of [facility] call rescheduling needs Take medications as prescribed   eliminate smoking in my home do breathing exercises every day keep follow-up appointments: 08/29/23  with PCP and 09/19/23 with Pulmonology  Plan:  Telephone follow up appointment with care management team member scheduled for:  08/29/23 @11 :15 am        Patient verbalizes understanding of instructions and care plan provided today and agrees to view in MyChart. Active MyChart status and patient understanding of how to access instructions and care plan via MyChart confirmed with patient.     Telephone follow up appointment with care management team member scheduled for:08/29/23 at 11:15am  Please call the care guide team at [phone] if you need to cancel or reschedule your appointment.   Please call 1-800-273-TALK (toll free, 24 hour hotline) go to [facility] Urgent [facility] [address], [place] [phone]) call 911 if you are experiencing a Mental Health or Behavioral Health Crisis or need someone to talk to.  Arna Better RN, BSN [place]  [facility] Health RN Care Manager [phone]

## 2023-08-23 NOTE — Transitions of Care (Post Inpatient/ED Visit) (Signed)
 Transition of Care week 3  Visit Note  08/23/2023  Name: [name] [name]. MRN: [id number]          DOB: 1961-01-26  Situation: Patient enrolled in [name] [name] 30-day program. Visit completed with [name] by telephone.   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   COPD (chronic obstructive pulmonary disease) (HCC)    on 4L home O2   Essential hypertension    Gout    Thrombocytopenia (HCC) 07/28/2012    Assessment: Patient Reported Symptoms: Cognitive Cognitive Status: Able to follow simple commands, Alert and oriented to person, place, and time, Insightful and able to interpret abstract concepts, Normal speech and language skills   Healing Pattern: Average  Neurological Neurological Review of Symptoms: No symptoms reported    HEENT HEENT Symptoms Reported: No symptoms reported      Cardiovascular Cardiovascular Symptoms Reported: No symptoms reported    Respiratory Respiratory Symptoms Reported: Shortness of breath Other Respiratory Symptoms: SOB with exertion Additional Respiratory Details: continues using oxygen  at 10 liters and using incentive spirometer several times a day Respiratory Conditions: COPD Respiratory Self-Management Outcome: 4 (good)  Endocrine Patient reports the following symptoms related to hypoglycemia or hyperglycemia : No symptoms reported Is patient diabetic?: No    Gastrointestinal Gastrointestinal Symptoms Reported: No symptoms reported      Genitourinary Genitourinary Symptoms Reported: No symptoms reported    Integumentary Integumentary Symptoms Reported: No symptoms reported    Musculoskeletal Musculoskelatal Symptoms Reviewed: Difficulty walking Additional Musculoskeletal Details: walker for ambulation Musculoskeletal Conditions: Mobility limited Musculoskeletal Comment: HH PT weekly to improve  mobility Falls in the past year?: No Patient at Risk for Falls Due to: Impaired mobility Fall risk Follow up: Education provided, Falls prevention discussed  Psychosocial Psychosocial Symptoms Reported: No symptoms reported         There were no vitals filed for this visit.  Medications Reviewed Today     Reviewed by Aura Leeds, RN (Registered Nurse) on 08/23/23 at 0920  Med List Status: <None>   Medication Order Taking? Sig Documenting Provider Last Dose Status Informant  albuterol  (PROVENTIL ) (2.5 MG/3ML) 0.083% nebulizer solution [id number] Yes TAKE 3 MLS (2.5 MG TOTAL) BY NEBULIZATION EVERY 6 (SIX) HOURS AS NEEDED FOR SHORTNESS OF BREATH. [name], PA-C Taking Active Self  albuterol  (VENTOLIN  HFA) 108 (90 Base) MCG/ACT inhaler [id number] Yes Inhale 2 puffs into the lungs every 6 (six) hours as needed for wheezing or shortness of breath [name], [place] Taking Active Self    Discontinued 07/03/11 1455 (Entry Error)   allopurinol  (ZYLOPRIM ) 100 MG tablet [id number] Yes Take 1 tablet (100 mg total) by mouth daily. [name], PA-C Taking Active Self  amLODipine  (NORVASC ) 10 MG tablet [id number] Yes Take 1 tablet (10 mg total) by mouth daily. [name], PA-C Taking Active Self  Ascorbic Acid  (VITAMIN C ) 1000 MG tablet [id number] Yes Take 1,000 mg by mouth daily. [provider] Taking Active Self  azithromycin  (ZITHROMAX ) 500 MG tablet [id number] No Take 1 tablet (500 mg total) by mouth daily.  Patient not taking: Reported on 08/23/2023   Singh, Prashant K, MD Not Taking Active            Med Note Neal Baldy, Trudy Kory A   Thu Aug 16, 2023  9:23 AM) Completed   budesonide -formoterol  (SYMBICORT ) 160-4.5 MCG/ACT inhaler [id number]  Yes Inhale 2 puffs into the lungs 2 (two) times daily. Mannam, Praveen, MD Taking Active Self  ezetimibe  (ZETIA ) 10 MG tablet [id number] Yes Take 1 tablet (10 mg total) by mouth daily. [name], PA-C Taking Active Self   ibuprofen  (ADVIL ) 200 MG tablet [id number] Yes Take 800 mg by mouth daily as needed (gout pain,swelling). [provider] Taking Active Self  loperamide  (IMODIUM ) 2 MG capsule [id number] Yes Take 2 capsules (4 mg total) by mouth every 6 (six) hours as needed for diarrhea or loose stools. Singh, Prashant K, MD 08/23/2023 Morning Active   metoprolol  succinate (TOPROL -XL) 50 MG 24 hr tablet [id number] Yes Take 1 tablet (50 mg total) by mouth daily. Newlin, Enobong, MD Taking Active Self  predniSONE  (DELTASONE ) 5 MG tablet [id number] Yes Take by mouth: SIX tablets daily for 5 days, then FOUR tablets daily for 5 days, then TWO tablets daily for 3 days, then ONE tablet daily for 3 days, then ONE HALF tablet daily for 3 days, then STOP. Singh, Prashant K, MD Taking Active   thiamine  (VITAMIN B-1) 100 MG tablet [id number] Yes Take 1 tablet (100 mg total) by mouth daily. Singh, Prashant K, MD Taking Active   tiotropium (SPIRIVA  HANDIHALER) 18 MCG inhalation capsule [id number] Yes PLACE 1 CAPSULE (18 MCG TOTAL) INTO INHALER AND INHALE DAILY. [name], PA-C Taking Active Self  vitamin B-12 (CYANOCOBALAMIN ) 500 MCG tablet [id number] Yes Take 500 mcg by mouth daily. [provider] Taking Active Self  VITAMIN D PO [id number] Yes Take 1 tablet by mouth daily. [provider] Taking Active Self            Recommendation:   none  Follow Up Plan:   Telephone follow-up in 1 week  Arna Better RN, BSN   [facility] Health RN Care Manager [phone]

## 2023-08-27 ENCOUNTER — Other Ambulatory Visit: Payer: Self-pay

## 2023-08-29 ENCOUNTER — Inpatient Hospital Stay: Admitting: Family Medicine

## 2023-08-29 ENCOUNTER — Other Ambulatory Visit: Payer: Self-pay | Admitting: *Deleted

## 2023-08-29 ENCOUNTER — Telehealth: Payer: Self-pay | Admitting: Family Medicine

## 2023-08-29 DIAGNOSIS — I129 Hypertensive chronic kidney disease with stage 1 through stage 4 chronic kidney disease, or unspecified chronic kidney disease: Secondary | ICD-10-CM | POA: Diagnosis not present

## 2023-08-29 DIAGNOSIS — G5603 Carpal tunnel syndrome, bilateral upper limbs: Secondary | ICD-10-CM | POA: Diagnosis not present

## 2023-08-29 DIAGNOSIS — Z9981 Dependence on supplemental oxygen: Secondary | ICD-10-CM | POA: Diagnosis not present

## 2023-08-29 DIAGNOSIS — E46 Unspecified protein-calorie malnutrition: Secondary | ICD-10-CM | POA: Diagnosis not present

## 2023-08-29 DIAGNOSIS — M109 Gout, unspecified: Secondary | ICD-10-CM | POA: Diagnosis not present

## 2023-08-29 DIAGNOSIS — J9621 Acute and chronic respiratory failure with hypoxia: Secondary | ICD-10-CM | POA: Diagnosis not present

## 2023-08-29 DIAGNOSIS — Z556 Problems related to health literacy: Secondary | ICD-10-CM | POA: Diagnosis not present

## 2023-08-29 DIAGNOSIS — J441 Chronic obstructive pulmonary disease with (acute) exacerbation: Secondary | ICD-10-CM | POA: Diagnosis not present

## 2023-08-29 DIAGNOSIS — Z7951 Long term (current) use of inhaled steroids: Secondary | ICD-10-CM | POA: Diagnosis not present

## 2023-08-29 DIAGNOSIS — E785 Hyperlipidemia, unspecified: Secondary | ICD-10-CM | POA: Diagnosis not present

## 2023-08-29 DIAGNOSIS — N182 Chronic kidney disease, stage 2 (mild): Secondary | ICD-10-CM | POA: Diagnosis not present

## 2023-08-29 DIAGNOSIS — J440 Chronic obstructive pulmonary disease with acute lower respiratory infection: Secondary | ICD-10-CM | POA: Diagnosis not present

## 2023-08-29 DIAGNOSIS — Z792 Long term (current) use of antibiotics: Secondary | ICD-10-CM | POA: Diagnosis not present

## 2023-08-29 DIAGNOSIS — E66812 Obesity, class 2: Secondary | ICD-10-CM | POA: Diagnosis not present

## 2023-08-29 DIAGNOSIS — Z6829 Body mass index (BMI) 29.0-29.9, adult: Secondary | ICD-10-CM | POA: Diagnosis not present

## 2023-08-29 DIAGNOSIS — D696 Thrombocytopenia, unspecified: Secondary | ICD-10-CM | POA: Diagnosis not present

## 2023-08-29 DIAGNOSIS — Z8616 Personal history of COVID-19: Secondary | ICD-10-CM | POA: Diagnosis not present

## 2023-08-29 DIAGNOSIS — J189 Pneumonia, unspecified organism: Secondary | ICD-10-CM | POA: Diagnosis not present

## 2023-08-29 DIAGNOSIS — Z7952 Long term (current) use of systemic steroids: Secondary | ICD-10-CM | POA: Diagnosis not present

## 2023-08-29 DIAGNOSIS — I272 Pulmonary hypertension, unspecified: Secondary | ICD-10-CM | POA: Diagnosis not present

## 2023-08-29 DIAGNOSIS — F17201 Nicotine dependence, unspecified, in remission: Secondary | ICD-10-CM | POA: Diagnosis not present

## 2023-08-29 DIAGNOSIS — R911 Solitary pulmonary nodule: Secondary | ICD-10-CM | POA: Diagnosis not present

## 2023-08-29 NOTE — Patient Instructions (Signed)
 Visit Information  Thank you for taking time to visit with me today. Please don't hesitate to contact me if I can be of assistance to you before our next scheduled telephone appointment.   Following is a copy of your care plan:   Goals Addressed             This Visit's Progress    VBCI Transitions of Care (TOC) Care Plan       Problems:  Recent Hospitalization for treatment of COPD Knowledge  Goal:  Over the next 30 days, the patient will not experience hospital readmission  Interventions:  Transitions of Care: Doctor Visits  - discussed the importance of doctor visits Reviewed Signs and symptoms of infection Reviewed hospital follow up on 08/29/23 Verified patient planning to attend PCP appointment today Advised patient to take all medications to provider appointments Discussed requesting refill for Metoprolol -patient will request and pick up today after PCP visit   COPD Interventions: Advised patient to engage in light exercise as tolerated 3-5 days a week to aid in the the management of COPD Discussed the importance of adequate rest and management of fatigue with COPD Provided education about and advised patient to utilize infection prevention strategies to reduce risk of respiratory infection Provided instruction about proper use of medications used for management of COPD including inhalers Screening for signs and symptoms of depression related to chronic disease state  Use of home oxygen  Ensured patient has oxygen  and using as instructed Discussed Pulmonology follow up scheduled on 09/19/23 Discussed breathing exercises, patient reports using incentive spirometer several times each hour-revisited  Patient Self Care Activities:  Attend all scheduled provider appointments Call pharmacy for medication refills 3-7 days in advance of running out of medications Call provider office for new concerns or questions  Notify RN Care Manager of [facility] call rescheduling needs Take  medications as prescribed   eliminate smoking in my home do breathing exercises every day keep follow-up appointments: 08/29/23 with PCP and 09/19/23 with Pulmonology  Plan:  Telephone follow up appointment with care management team member scheduled for:  09/07/23 @11 :15 am        Patient verbalizes understanding of instructions and care plan provided today and agrees to view in MyChart. Active MyChart status and patient understanding of how to access instructions and care plan via MyChart confirmed with patient.     Telephone follow up appointment with care management team member scheduled for:09/07/23 at 1:30pm  Please call the care guide team at [phone] if you need to cancel or reschedule your appointment.   Please call 1-800-273-TALK (toll free, 24 hour hotline) go to [facility] Urgent [facility] [address], [place] [phone]) call 911 if you are experiencing a Mental Health or Behavioral Health Crisis or need someone to talk to.  Arna Better RN, BSN [place]  Value[facility] Institute [facility] Health RN Care Manager [phone]

## 2023-08-29 NOTE — Transitions of Care (Post Inpatient/ED Visit) (Signed)
 Transition of Care week 4  Visit Note  08/29/2023  Name: [name] [name]. MRN: [id number]          DOB: 1960/10/23  Situation: Patient enrolled in [facility] 30-day program. Visit completed with [name] by telephone.   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   COPD (chronic obstructive pulmonary disease) (HCC)    on 4L home O2   Essential hypertension    Gout    Thrombocytopenia (HCC) 07/28/2012    Assessment: Patient Reported Symptoms: Cognitive Cognitive Status: Alert and oriented to person, place, and time, Able to follow simple commands, Normal speech and language skills      Neurological Neurological Review of Symptoms: No symptoms reported    HEENT HEENT Symptoms Reported: No symptoms reported      Cardiovascular Cardiovascular Symptoms Reported: Other: Other Cardiovascular Symptoms: elevated BP 156/98 Does patient have uncontrolled Hypertension?: Yes Is patient checking Blood Pressure at home?: Yes Patient's Recent BP reading at home: BP checked by [facility] RN, BP today 156/98 Cardiovascular Conditions: Hypertension Cardiovascular Management Strategies: Medication therapy, Routine screening, Coping strategies Cardiovascular Self-Management Outcome: 2 (bad)  Respiratory Respiratory Symptoms Reported: Shortness of breath Other Respiratory Symptoms: SOB with exertion Additional Respiratory Details: oxygen  at 10 liters and 15 liter with activity as needed, using incentive spirometer several times a day Respiratory Conditions: COPD Respiratory Self-Management Outcome: 4 (good)  Endocrine Patient reports the following symptoms related to hypoglycemia or hyperglycemia : No symptoms reported    Gastrointestinal Gastrointestinal Symptoms Reported: No symptoms reported      Genitourinary Genitourinary Symptoms Reported: No symptoms  reported    Integumentary Integumentary Symptoms Reported: No symptoms reported    Musculoskeletal   Musculoskeletal Conditions: Mobility limited Musculoskeletal Management Strategies: Routine screening, Coping strategies Musculoskeletal Self-Management Outcome: 4 (good) Musculoskeletal Comment: Ambulating with rollator, feels he is getting around good Falls in the past year?: No Patient at Risk for Falls Due to: Impaired mobility Fall risk Follow up: Education provided, Falls prevention discussed  Psychosocial Psychosocial Symptoms Reported: No symptoms reported     Quality of Family Relationships: helpful, involved, supportive Do you feel physically threatened by others?: No   Vitals:   08/29/23 1158  BP: (!) 156/98    Medications Reviewed Today     Reviewed by Aura Leeds, RN (Registered Nurse) on 08/29/23 at 1148  Med List Status: <None>   Medication Order Taking? Sig Documenting Provider Last Dose Status Informant  albuterol  (PROVENTIL ) (2.5 MG/3ML) 0.083% nebulizer solution [id number] Yes TAKE 3 MLS (2.5 MG TOTAL) BY NEBULIZATION EVERY 6 (SIX) HOURS AS NEEDED FOR SHORTNESS OF BREATH. [name], PA-C Taking Active Self  albuterol  (VENTOLIN  HFA) 108 (90 Base) MCG/ACT inhaler [id number] Yes Inhale 2 puffs into the lungs every 6 (six) hours as needed for wheezing or shortness of breath [name], [place] Taking Active Self    Discontinued 07/03/11 1455 (Entry Error)   allopurinol  (ZYLOPRIM ) 100 MG tablet [id number] Yes Take 1 tablet (100 mg total) by mouth daily. [name], PA-C Taking Active Self  amLODipine  (NORVASC ) 10 MG tablet [id number] Yes Take 1 tablet (10 mg total) by mouth daily. [name], PA-C Taking Active Self  Ascorbic Acid  (VITAMIN C ) 1000 MG tablet [id number] Yes Take 1,000 mg by mouth daily. [provider] Taking Active Self  azithromycin  (ZITHROMAX ) 500 MG tablet  [id number] No Take 1 tablet (500 mg total) by mouth daily.   Patient not taking: Reported on 08/16/2023   Singh, Prashant K, MD Not Taking Active            Med Note (Demarrion Meiklejohn A   Thu Aug 16, 2023  9:23 AM) Completed   budesonide -formoterol  (SYMBICORT ) 160-4.5 MCG/ACT inhaler [id number] Yes Inhale 2 puffs into the lungs 2 (two) times daily. Mannam, Praveen, MD Taking Active Self  ezetimibe  (ZETIA ) 10 MG tablet [id number] Yes Take 1 tablet (10 mg total) by mouth daily. [name], PA-C Taking Active Self  ibuprofen  (ADVIL ) 200 MG tablet [id number] Yes Take 800 mg by mouth daily as needed (gout pain,swelling). [provider] Taking Active Self  loperamide  (IMODIUM ) 2 MG capsule [id number] Yes Take 2 capsules (4 mg total) by mouth every 6 (six) hours as needed for diarrhea or loose stools. Singh, Prashant K, MD Taking Active   metoprolol  succinate (TOPROL -XL) 50 MG 24 hr tablet [id number] No Take 1 tablet (50 mg total) by mouth daily.  Patient not taking: Reported on 08/29/2023   Newlin, Enobong, MD Not Taking Active Self           Med Note (Christophor Eick A   Wed Aug 29, 2023 11:48 AM) Needs refill  predniSONE  (DELTASONE ) 5 MG tablet [id number] No Take by mouth: SIX tablets daily for 5 days, then FOUR tablets daily for 5 days, then TWO tablets daily for 3 days, then ONE tablet daily for 3 days, then ONE HALF tablet daily for 3 days, then STOP.  Patient not taking: Reported on 08/29/2023   Cala Castleman, MD Not Taking Active            Med Note (Farrel Guimond A   Wed Aug 29, 2023 11:45 AM) completed  thiamine  (VITAMIN B-1) 100 MG tablet [id number] Yes Take 1 tablet (100 mg total) by mouth daily. Singh, Prashant K, MD Taking Active   tiotropium (SPIRIVA  HANDIHALER) 18 MCG inhalation capsule [id number] Yes PLACE 1 CAPSULE (18 MCG TOTAL) INTO INHALER AND INHALE DAILY. [name], PA-C Taking Active Self  vitamin B-12 (CYANOCOBALAMIN ) 500 MCG tablet [id number] Yes Take 500 mcg by mouth daily. [provider] Taking Active Self   VITAMIN D PO [id number] Yes Take 1 tablet by mouth daily. [provider] Taking Active Self            Recommendation:   none  Follow Up Plan:   Telephone follow-up in 1 week  Arna Better RN, BSN [place]  Value[facility] Institute [facility] Health RN Care Manager [phone]

## 2023-08-29 NOTE — Telephone Encounter (Signed)
 Copied from CRM [id number]. Topic: General - Other >> Aug 29, 2023 11:33 AM Felizardo Hotter wrote:  Reason for CRM: Received called from [facility] per Ty ph: [phone] his blood pressure was 150/98 chest pain and relief with Tums and when he belched. Pt has appt for today.

## 2023-08-29 NOTE — Telephone Encounter (Signed)
 Noted

## 2023-08-30 ENCOUNTER — Other Ambulatory Visit: Payer: Self-pay

## 2023-08-30 ENCOUNTER — Emergency Department (HOSPITAL_COMMUNITY)

## 2023-08-30 ENCOUNTER — Encounter (HOSPITAL_COMMUNITY): Payer: Self-pay

## 2023-08-30 ENCOUNTER — Emergency Department (HOSPITAL_COMMUNITY)
Admission: EM | Admit: 2023-08-30 | Discharge: 2023-08-30 | Disposition: A | Discharge status: Home / Self Care | Attending: Emergency Medicine | Admitting: Emergency Medicine

## 2023-08-30 DIAGNOSIS — J449 Chronic obstructive pulmonary disease, unspecified: Secondary | ICD-10-CM | POA: Diagnosis not present

## 2023-08-30 DIAGNOSIS — J439 Emphysema, unspecified: Secondary | ICD-10-CM | POA: Diagnosis not present

## 2023-08-30 DIAGNOSIS — R0602 Shortness of breath: Secondary | ICD-10-CM | POA: Diagnosis not present

## 2023-08-30 DIAGNOSIS — R079 Chest pain, unspecified: Secondary | ICD-10-CM | POA: Diagnosis not present

## 2023-08-30 DIAGNOSIS — R072 Precordial pain: Secondary | ICD-10-CM | POA: Diagnosis not present

## 2023-08-30 DIAGNOSIS — I129 Hypertensive chronic kidney disease with stage 1 through stage 4 chronic kidney disease, or unspecified chronic kidney disease: Secondary | ICD-10-CM | POA: Insufficient documentation

## 2023-08-30 DIAGNOSIS — J45909 Unspecified asthma, uncomplicated: Secondary | ICD-10-CM | POA: Insufficient documentation

## 2023-08-30 DIAGNOSIS — I10 Essential (primary) hypertension: Secondary | ICD-10-CM | POA: Diagnosis not present

## 2023-08-30 DIAGNOSIS — R0789 Other chest pain: Secondary | ICD-10-CM | POA: Diagnosis present

## 2023-08-30 DIAGNOSIS — N182 Chronic kidney disease, stage 2 (mild): Secondary | ICD-10-CM | POA: Diagnosis not present

## 2023-08-30 LAB — COMPREHENSIVE METABOLIC PANEL WITH GFR
ALT: 15 U/L (ref 0–44)
AST: 16 U/L (ref 15–41)
Albumin: 3.4 g/dL — ABNORMAL LOW (ref 3.5–5.0)
Alkaline Phosphatase: 70 U/L (ref 38–126)
Anion gap: 11 (ref 5–15)
BUN: 6 mg/dL — ABNORMAL LOW (ref 8–23)
CO2: 30 mmol/L (ref 22–32)
Calcium: 10.2 mg/dL (ref 8.9–10.3)
Chloride: 99 mmol/L (ref 98–111)
Creatinine, Ser: 1.22 mg/dL (ref 0.61–1.24)
GFR, Estimated: >60 mL/min (ref >60)
Glucose, Bld: 88 mg/dL (ref 70–99)
Potassium: 4.1 mmol/L (ref 3.5–5.1)
Sodium: 140 mmol/L (ref 135–145)
Total Bilirubin: 1.1 mg/dL (ref 0.0–1.2)
Total Protein: 7.3 g/dL (ref 6.5–8.1)

## 2023-08-30 LAB — CBC WITH DIFFERENTIAL/PLATELET
Abs Immature Granulocytes: 0.03 10*3/uL (ref 0.00–0.07)
Basophils Absolute: 0 10*3/uL (ref 0.0–0.1)
Basophils Relative: 1 %
Eosinophils Absolute: 0.3 10*3/uL (ref 0.0–0.5)
Eosinophils Relative: 4 %
HCT: 49.8 % (ref 39.0–52.0)
Hemoglobin: 15.1 g/dL (ref 13.0–17.0)
Immature Granulocytes: 0 %
Lymphocytes Relative: 19 %
Lymphs Abs: 1.4 10*3/uL (ref 0.7–4.0)
MCH: 23.2 pg — ABNORMAL LOW (ref 26.0–34.0)
MCHC: 30.3 g/dL (ref 30.0–36.0)
MCV: 76.6 fL — ABNORMAL LOW (ref 80.0–100.0)
Monocytes Absolute: 0.8 10*3/uL (ref 0.1–1.0)
Monocytes Relative: 11 %
Neutro Abs: 4.8 10*3/uL (ref 1.7–7.7)
Neutrophils Relative %: 65 %
Platelets: 194 10*3/uL (ref 150–400)
RBC: 6.5 MIL/uL — ABNORMAL HIGH (ref 4.22–5.81)
RDW: 16.2 % — ABNORMAL HIGH (ref 11.5–15.5)
WBC: 7.4 10*3/uL (ref 4.0–10.5)
nRBC: 0 % (ref 0.0–0.2)

## 2023-08-30 LAB — BRAIN NATRIURETIC PEPTIDE: B Natriuretic Peptide: 24.4 pg/mL (ref 0.0–100.0)

## 2023-08-30 LAB — TROPONIN I (HIGH SENSITIVITY)
Troponin I (High Sensitivity): 7 ng/L (ref <18)
Troponin I (High Sensitivity): 7 ng/L (ref <18)

## 2023-08-30 LAB — LIPASE, BLOOD: Lipase: 25 U/L (ref 11–51)

## 2023-08-30 MED ORDER — DICYCLOMINE HCL 20 MG PO TABS
20.0000 mg | ORAL_TABLET | Freq: Three times a day (TID) | ORAL | 0 refills | Status: DC | PRN
  Filled 2023-08-30: qty 20, 7d supply, fill #0

## 2023-08-30 NOTE — ED Triage Notes (Signed)
 Patient BIB EMS from home C/O chest pain that "feels like indigestion" for a couple days now and will not go away. Patient is on 10L [place] at all times and 15L [place] when walking.

## 2023-08-30 NOTE — ED Provider Notes (Signed)
 Emergency Department Provider Note   I have reviewed the triage vital signs and the nursing notes.   HISTORY  Chief Complaint Chest Pain   HPI [name] [name]. is a 63 y.o. male with history of COPD with chronic respiratory failure requiring 10 to 15 L O2 at home, hypertension, and CKD presents emergency department with chest discomfort.  Patient has an on and off feeling of "gas" in his chest.  No clear provoking factors such as ambulation or position changes.  No fevers or chills.  He is not feeling particularly short of breath or low energy.  He has noticed the discomfort over the past couple of days.  No prior history of ACS. No change in O2 requirement. No active CP.    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 Respiratory: Baseline shortness of breath. Gastrointestinal: No abdominal pain.  No nausea, no vomiting.   Skin: Negative for rash. Neurological: Negative for headaches. ____________________________________________   PHYSICAL EXAM:  VITAL SIGNS: ED Triage Vitals  Encounter Vitals Group     BP 08/30/23 1001 (!) 132/115     Pulse Rate 08/30/23 1001 77     Resp 08/30/23 1001 (!) 22     Temp 08/30/23 1001 98 F (36.7 C)     Temp Source 08/30/23 1001 Oral     SpO2 08/30/23 1001 96 %     Weight 08/30/23 0959 185 lb 3 oz (84 kg)     Height 08/30/23 0959 5\' 6"  (1.676 m)   Constitutional: Alert and oriented. Well appearing and in no acute distress. Eyes: Conjunctivae are normal. Head: Atraumatic. Nose: No congestion/rhinnorhea. Mouth/Throat: Mucous membranes are moist.  Oropharynx non-erythematous. Neck: No stridor.  Cardiovascular: Normal rate, regular  rhythm. Good peripheral circulation. Grossly normal heart sounds.   Respiratory: Normal respiratory effort.  No retractions. Lungs CTAB. Gastrointestinal: Soft and nontender. No distention.  Musculoskeletal:  No gross deformities of extremities. Neurologic:  Normal speech and language.  Skin:  Skin is warm, dry and intact. No rash noted.  ____________________________________________   LABS (all labs ordered are listed, but only abnormal results are displayed)  Labs Reviewed  COMPREHENSIVE METABOLIC PANEL WITH GFR - Abnormal; Notable for the following components:      Result Value   BUN 6 (*)    Albumin 3.4 (*)    All other components within normal limits  CBC WITH DIFFERENTIAL/PLATELET - Abnormal; Notable for the following components:   RBC 6.50 (*)    MCV 76.6 (*)    MCH 23.2 (*)    RDW 16.2 (*)    All other components within normal limits  BRAIN NATRIURETIC PEPTIDE  LIPASE, BLOOD  TROPONIN I (HIGH SENSITIVITY)  TROPONIN I (HIGH SENSITIVITY)   ____________________________________________  EKG   EKG Interpretation Date/Time:  Thursday Aug 30 2023 10:03:03 EDT Ventricular Rate:  72 PR Interval:  167 QRS Duration:  96 QT Interval:  433 QTC Calculation: 474 R Axis:   56  Text Interpretation: Sinus rhythm Nonspecific T abnrm, anterolateral leads Confirmed by Abby Hocking [phone]) on 08/30/2023 10:04:22 AM        ____________________________________________  RADIOLOGY  DG Chest Portable 1 View Result Date:  08/30/2023 CLINICAL DATA:  Chest pain and shortness of breath. EXAM: PORTABLE CHEST 1 VIEW COMPARISON:  Chest radiograph dated 07/31/2023 FINDINGS: Background of emphysema. No focal consolidation, pleural effusion, or pneumothorax. Bilateral mid to lower lung field interstitial coarsening. The cardiac silhouette is within normal limits. No acute osseous pathology. IMPRESSION: 1. No active disease. 2. Emphysema. Electronically Signed   By: Angus Bark M.D.   On:  08/30/2023 11:38    ____________________________________________   PROCEDURES  Procedure(s) performed:   Procedures  None  ____________________________________________   INITIAL IMPRESSION / ASSESSMENT AND PLAN / ED COURSE  Pertinent labs & imaging results that were available during my care of the patient were reviewed by me and considered in my medical decision making (see chart for details).   This patient is Presenting for Evaluation of CP, which does require a range of treatment options, and is a complaint that involves a high risk of morbidity and mortality.  The Differential Diagnoses includes but is not exclusive to acute coronary syndrome, aortic dissection, pulmonary embolism, cardiac tamponade, community-acquired pneumonia, pericarditis, musculoskeletal chest wall pain, etc.   Critical Interventions-    Medications - No data to display  Reassessment after intervention:    I decided to review pertinent External Data, and in summary last admit was later April with PNA. Completed steroids and abx as instructed. .   Clinical Laboratory Tests Ordered, included troponin negative x 2. BNP normal. Normal LFTs and lipase.   Radiologic Tests Ordered, included CXR. I independently interpreted the images and agree with radiology interpretation.   Cardiac Monitor Tracing which shows NSR.   Social Determinants of Health Risk patient is not an active smoker.   Medical Decision Making: Summary:  With history of chronic respiratory failure presents with gas/tightness feeling in his chest.  ACS is a consideration.  Plan for trending troponins with intermittent symptoms.  No active pain.  No acute ischemic change on his EKG. No change in O2 requirement.   Reevaluation with update and discussion with patient. CP resolved. Troponin negative x 2. Considering non-cardiac etiologies of CP but advise close Cardiology follow up along with PCP follow up.   Considered admission Cardiac  workup is reassuring. Stable for discharge.   Patient's presentation is most consistent with acute presentation with potential threat to life or bodily function.   Disposition: discharge  ____________________________________________  FINAL CLINICAL IMPRESSION(S) / ED DIAGNOSES  Final diagnoses:  Precordial chest pain     NEW OUTPATIENT MEDICATIONS STARTED DURING THIS VISIT:  Discharge Medication List as of 08/30/2023  3:16 PM     START taking these medications   Details  dicyclomine (BENTYL) 20 MG tablet Take 1 tablet (20 mg total) by mouth 3 (three) times daily as needed for spasms., Starting Thu 08/30/2023, Normal        Note:  This document was prepared using Dragon voice recognition software and may include unintentional dictation errors.  Abby Hocking, MD, [facility]    Avira Tillison, Shereen Dike, MD 08/31/23 [phone]

## 2023-08-30 NOTE — Discharge Instructions (Signed)

## 2023-08-31 ENCOUNTER — Other Ambulatory Visit: Payer: Self-pay

## 2023-09-07 ENCOUNTER — Other Ambulatory Visit: Payer: Self-pay | Admitting: *Deleted

## 2023-09-07 DIAGNOSIS — J441 Chronic obstructive pulmonary disease with (acute) exacerbation: Secondary | ICD-10-CM

## 2023-09-08 ENCOUNTER — Encounter (HOSPITAL_COMMUNITY): Payer: Self-pay

## 2023-09-08 ENCOUNTER — Inpatient Hospital Stay (HOSPITAL_COMMUNITY)
Admission: EM | Admit: 2023-09-08 | Discharge: 2023-09-14 | DRG: 191 | Disposition: A | Discharge status: Home Health | Attending: Family Medicine | Admitting: Family Medicine

## 2023-09-08 ENCOUNTER — Other Ambulatory Visit: Payer: Self-pay

## 2023-09-08 DIAGNOSIS — Z9981 Dependence on supplemental oxygen: Secondary | ICD-10-CM

## 2023-09-08 DIAGNOSIS — D509 Iron deficiency anemia, unspecified: Secondary | ICD-10-CM | POA: Diagnosis present

## 2023-09-08 DIAGNOSIS — Z87891 Personal history of nicotine dependence: Secondary | ICD-10-CM

## 2023-09-08 DIAGNOSIS — E559 Vitamin D deficiency, unspecified: Secondary | ICD-10-CM | POA: Diagnosis present

## 2023-09-08 DIAGNOSIS — R069 Unspecified abnormalities of breathing: Secondary | ICD-10-CM | POA: Diagnosis not present

## 2023-09-08 DIAGNOSIS — Z8249 Family history of ischemic heart disease and other diseases of the circulatory system: Secondary | ICD-10-CM

## 2023-09-08 DIAGNOSIS — Z79899 Other long term (current) drug therapy: Secondary | ICD-10-CM

## 2023-09-08 DIAGNOSIS — M109 Gout, unspecified: Secondary | ICD-10-CM | POA: Diagnosis present

## 2023-09-08 DIAGNOSIS — Z7951 Long term (current) use of inhaled steroids: Secondary | ICD-10-CM

## 2023-09-08 DIAGNOSIS — J9611 Chronic respiratory failure with hypoxia: Secondary | ICD-10-CM | POA: Diagnosis present

## 2023-09-08 DIAGNOSIS — I129 Hypertensive chronic kidney disease with stage 1 through stage 4 chronic kidney disease, or unspecified chronic kidney disease: Secondary | ICD-10-CM | POA: Diagnosis present

## 2023-09-08 DIAGNOSIS — J441 Chronic obstructive pulmonary disease with (acute) exacerbation: Principal | ICD-10-CM | POA: Diagnosis present

## 2023-09-08 DIAGNOSIS — R0689 Other abnormalities of breathing: Secondary | ICD-10-CM | POA: Diagnosis not present

## 2023-09-08 DIAGNOSIS — R457 State of emotional shock and stress, unspecified: Secondary | ICD-10-CM | POA: Diagnosis not present

## 2023-09-08 DIAGNOSIS — N182 Chronic kidney disease, stage 2 (mild): Secondary | ICD-10-CM | POA: Diagnosis present

## 2023-09-08 DIAGNOSIS — R0902 Hypoxemia: Secondary | ICD-10-CM | POA: Diagnosis not present

## 2023-09-08 DIAGNOSIS — Z8616 Personal history of COVID-19: Secondary | ICD-10-CM

## 2023-09-08 DIAGNOSIS — Z66 Do not resuscitate: Secondary | ICD-10-CM | POA: Diagnosis present

## 2023-09-08 DIAGNOSIS — I10 Essential (primary) hypertension: Secondary | ICD-10-CM | POA: Diagnosis not present

## 2023-09-08 LAB — I-STAT VENOUS BLOOD GAS, ED
Acid-Base Excess: 5 mmol/L — ABNORMAL HIGH (ref 0.0–2.0)
Bicarbonate: 32.5 mmol/L — ABNORMAL HIGH (ref 20.0–28.0)
Calcium, Ion: 1.22 mmol/L (ref 1.15–1.40)
HCT: 45 % (ref 39.0–52.0)
Hemoglobin: 15.3 g/dL (ref 13.0–17.0)
O2 Saturation: 86 %
Potassium: 3.5 mmol/L (ref 3.5–5.1)
Sodium: 138 mmol/L (ref 135–145)
TCO2: 34 mmol/L — ABNORMAL HIGH (ref 22–32)
pCO2, Ven: 58 mmHg (ref 44–60)
pH, Ven: 7.356 (ref 7.25–7.43)
pO2, Ven: 56 mmHg — ABNORMAL HIGH (ref 32–45)

## 2023-09-08 NOTE — ED Triage Notes (Signed)
 Arrives [facility] from home with severe shortness of breath that manifested today.   Upon paramedic arrival pt was wearing 2 separate nasal cannulas receiving a total of 12L [place] and sats at 87%.  Wears 10L nasal cannula at baseline per pulmonology note. 15L [place] with exertion.   Administered: 2g Mag  125mg  solumedrol  10mg  albuterol   1mg  atrovent 

## 2023-09-08 NOTE — ED Provider Notes (Signed)
 [place] EMERGENCY DEPARTMENT AT [facility] Provider Note  CSN: [id number] Arrival date & time: 09/08/23 2337  Chief Complaint(s) Shortness of Breath  HPI [name] [name]. is a 63 y.o. male with a past medical history listed below including COPD on supplemental oxygen  at home listed as 10 L at rest and 15 L nasal cannula while movement here for intermittent shortness of breath throughout the day.  Patient has tried using his home breathing treatments with intermittent relief.  Patient believes his condenser is not giving him enough oxygen .  Denies any recent fevers or infections.  No coughing or congestion.  No chest pain.  No extremity edema.  He was brought in by EMS who noted he was satting mid 80s on 12 L nasal cannula.  Diminished lung sounds throughout.  He received 10 mg of albuterol  and 1 mg of Atrovent , 2 g of mag, and 125 mg of Solu-Medrol  and route.  Currently patient feels better but still feels a bit short of breath.  The history is provided by the patient.    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   COPD (chronic obstructive pulmonary disease) (HCC)    on 4L home O2   Essential hypertension    Gout    Thrombocytopenia (HCC) 07/28/2012   Patient Active Problem List   Diagnosis Date Noted   COPD with acute exacerbation (HCC) 09/09/2023   Pulmonary nodules 08/01/2023   Respiratory distress 07/31/2023   Viral respiratory infection 06/14/2023   Acute on chronic hypoxic respiratory failure (HCC) 06/13/2023   Acute on chronic respiratory failure with hypoxia (HCC) 05/10/2022   COVID-19 virus infection 05/10/2022   Statin intolerance 04/20/2021   Hyperlipidemia 04/19/2021   Acute hypoxemic respiratory failure due to COVID-19 (HCC) 12/04/2020   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   Chronic respiratory failure with hypoxia (HCC) 10/30/2016   Class 2 obesity 09/17/2015    Bilateral carpal tunnel syndrome 09/09/2015   Ulnar neuropathy of both upper extremities 09/09/2015   Numbness of left hand 01/21/2015   Abnormal EKG 10/27/2014   Moderate to severe pulmonary hypertension (HCC) 12/14/2013   COPD exacerbation (HCC) 12/11/2013   Cocaine abuse in remission (HCC) 12/11/2013   Hypertension 12/11/2013   COPD bronchitis 08/08/2012   Gout 08/08/2012   CKD (chronic kidney disease) stage 2, GFR 60-89 ml/min 08/03/2012   Tobacco abuse, 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 Authorizing Provider  albuterol  (PROVENTIL ) (2.5 MG/3ML) 0.083% nebulizer solution TAKE 3 MLS (2.5 MG TOTAL) BY NEBULIZATION EVERY 6 (SIX) HOURS AS NEEDED FOR SHORTNESS OF BREATH. 07/11/23   [name], PA-C  albuterol  (VENTOLIN  HFA) 108 (90 Base) MCG/ACT inhaler Inhale 2 puffs into the lungs every 6 (six) hours as needed for wheezing or shortness of breath 07/11/23   [name], PA-C  allopurinol  (ZYLOPRIM ) 100 MG tablet Take 1 tablet (100 mg total) by mouth daily. 07/11/23   [name], PA-C  amLODipine  (NORVASC ) 10 MG tablet Take 1 tablet (10 mg total) by mouth daily. 07/11/23   [name], PA-C  Ascorbic Acid  (VITAMIN C ) 1000 MG tablet Take 1,000 mg by mouth daily.    [provider]  azithromycin  (ZITHROMAX ) 500 MG tablet Take 1 tablet (500 mg total) by mouth daily. Patient not taking: Reported on 09/07/2023  08/07/23   Cala Castleman, MD  budesonide -formoterol  (SYMBICORT ) 160-4.5 MCG/ACT inhaler Inhale 2 puffs into the lungs 2 (two) times daily. 06/12/23   Mannam, Praveen, MD  dicyclomine  (BENTYL ) 20 MG tablet Take 1 tablet (20 mg total) by mouth 3 (three) times daily as needed for spasms. 08/30/23   Long, Shereen Dike, MD  ezetimibe  (ZETIA ) 10 MG tablet Take 1 tablet (10 mg total) by mouth daily. 07/11/23   McClung, Angela M, PA-C  ibuprofen   (ADVIL ) 200 MG tablet Take 800 mg by mouth daily as needed (gout pain,swelling).    [provider]  loperamide  (IMODIUM ) 2 MG capsule Take 2 capsules (4 mg total) by mouth every 6 (six) hours as needed for diarrhea or loose stools. 08/07/23   Singh, Prashant K, MD  metoprolol  succinate (TOPROL -XL) 50 MG 24 hr tablet Take 1 tablet (50 mg total) by mouth daily. 05/23/23   Newlin, Enobong, MD  predniSONE  (DELTASONE ) 5 MG tablet Take by mouth: SIX tablets daily for 5 days, then FOUR tablets daily for 5 days, then TWO tablets daily for 3 days, then ONE tablet daily for 3 days, then ONE HALF tablet daily for 3 days, then STOP. Patient not taking: Reported on 09/07/2023 08/07/23   Singh, Prashant K, MD  thiamine  (VITAMIN B-1) 100 MG tablet Take 1 tablet (100 mg total) by mouth daily. 08/07/23   Singh, Prashant K, MD  tiotropium (SPIRIVA  HANDIHALER) 18 MCG inhalation capsule PLACE 1 CAPSULE (18 MCG TOTAL) INTO INHALER AND INHALE DAILY. 07/11/23   McClung, Angela M, PA-C  vitamin B-12 (CYANOCOBALAMIN ) 500 MCG tablet Take 500 mcg by mouth daily.    [provider]  VITAMIN D PO Take 1 tablet by mouth daily.    [provider]  ALBUTEROL  IN Inhale into the lungs.  07/03/11  [provider]                                                                                                                                    Allergies Atorvastatin , Shellfish allergy, Beef-derived drug products, and Colchicine   Review of Systems Review of Systems As noted in HPI  Physical Exam Vital Signs  I have reviewed the triage vital signs BP (!) 144/95   Pulse 93   Temp 97.8 F (36.6 C) (Axillary)   Resp 19   Ht 5\' 6"  (1.676 m)   Wt 83.5 kg   SpO2 100%   BMI 29.70 kg/m   Physical Exam Vitals reviewed.  Constitutional:      General: He is not in acute distress.    Appearance: He is well-developed. He is not diaphoretic.  HENT:     Head: Normocephalic and atraumatic.     Nose:  Nose normal.  Eyes:     General: No scleral icterus.       Right eye: No discharge.        Left eye: No discharge.  Conjunctiva/sclera: Conjunctivae normal.     Pupils: Pupils are equal, round, and reactive to light.  Cardiovascular:     Rate and Rhythm: Normal rate and regular rhythm.     Heart sounds: No murmur heard.    No friction rub. No gallop.  Pulmonary:     Effort: Tachypnea and respiratory distress present. No accessory muscle usage or retractions.     Breath sounds: Normal breath sounds. Decreased air movement present. No stridor. No wheezing or rales.  Abdominal:     General: There is no distension.     Palpations: Abdomen is soft.     Tenderness: There is no abdominal tenderness.  Musculoskeletal:        General: No tenderness.     Cervical back: Normal range of motion and neck supple.  Skin:    General: Skin is warm and dry.     Findings: No erythema or rash.  Neurological:     Mental Status: He is alert and oriented to person, place, and time.     ED Results and Treatments Labs (all labs ordered are listed, but only abnormal results are displayed) Labs Reviewed  COMPREHENSIVE METABOLIC PANEL WITH GFR - Abnormal; Notable for the following components:      Result Value   Glucose, Bld 111 (*)    All other components within normal limits  CBC WITH DIFFERENTIAL/PLATELET - Abnormal; Notable for the following components:   WBC 14.8 (*)    RBC 5.83 (*)    MCV 76.3 (*)    MCH 23.0 (*)    Neutro Abs 9.4 (*)    Monocytes Absolute 1.9 (*)    All other components within normal limits  I-STAT VENOUS BLOOD GAS, ED - Abnormal; Notable for the following components:   pO2, Ven 56 (*)    Bicarbonate 32.5 (*)    TCO2 34 (*)    Acid-Base Excess 5.0 (*)    All other components within normal limits  RESP PANEL BY RT-PCR (RSV, FLU A&B, COVID)  RVPGX2  CBC  CREATININE, SERUM  BRAIN NATRIURETIC PEPTIDE                                                                                                                          EKG  EKG Interpretation Date/Time:  Saturday Sep 08 2023 23:42:24 EDT Ventricular Rate:  94 PR Interval:  150 QRS Duration:  104 QT Interval:  357 QTC Calculation: 447 R Axis:   62  Text Interpretation: Sinus rhythm Atrial premature complexes Borderline T wave abnormalities Confirmed by Townsend Freud [phone]) on 09/09/2023 12:28:22 AM       Radiology DG Chest Port 1 View Result Date: 09/09/2023 CLINICAL DATA:  Shortness of breath EXAM: PORTABLE CHEST 1 VIEW COMPARISON:  08/30/2023 FINDINGS: Cardiac enlargement. No vascular congestion. Emphysematous changes in the lungs with bullous changes in the upper lungs. Coarse interstitial infiltrates in the lung bases most likely representing chronic fibrosis but could indicate edema. Similar appearance  to previous study. No developing consolidation. No pleural effusion or pneumothorax. Mediastinal contours appear intact. IMPRESSION: Cardiac enlargement. Emphysematous changes and fibrosis in the lungs. No focal consolidation. Electronically Signed   By: Boyce Byes M.D.   On: 09/09/2023 00:34    Medications Ordered in ED Medications  albuterol  (PROVENTIL ) (2.5 MG/3ML) 0.083% nebulizer solution (10 mg/hr Nebulization New Bag/Given 09/09/23 0634)  methylPREDNISolone  sodium succinate (SOLU-MEDROL ) 125 mg/2 mL injection 60 mg (60 mg Intravenous Given 09/09/23 0745)  cefTRIAXone  (ROCEPHIN ) 1 g in sodium chloride  0.9 % 100 mL IVPB (1 g Intravenous New Bag/Given 09/09/23 0751)  enoxaparin  (LOVENOX ) injection 40 mg (40 mg Subcutaneous Given 09/09/23 0745)  amLODipine  (NORVASC ) tablet 10 mg (has no administration in time range)  fluticasone  furoate-vilanterol (BREO ELLIPTA ) 200-25 MCG/ACT 1 puff (has no administration in time range)  dicyclomine  (BENTYL ) tablet 20 mg (has no administration in time range)  ezetimibe  (ZETIA ) tablet 10 mg (has no administration in time range)  metoprolol  succinate (TOPROL -XL) 24  hr tablet 50 mg (has no administration in time range)  thiamine  (VITAMIN B1) tablet 100 mg (has no administration in time range)   Procedures .Critical Care  Performed by: Lindle Rhea, MD Authorized by: Lindle Rhea, MD   Critical care provider statement:    Critical care time (minutes):  30   Critical care was necessary to treat or prevent imminent or life-threatening deterioration of the following conditions:  Respiratory failure   Critical care was time spent personally by me on the following activities:  Development of treatment plan with patient or surrogate, discussions with consultants, evaluation of patient's response to treatment, examination of patient, ordering and review of laboratory studies, ordering and review of radiographic studies, ordering and performing treatments and interventions, pulse oximetry, re-evaluation of patient's condition and review of old charts   (including critical care time) Medical Decision Making / ED Course   Medical Decision Making Amount and/or Complexity of Data Reviewed Labs: ordered. Decision-making details documented in ED Course. Radiology: ordered and independent interpretation performed. Decision-making details documented in ED Course. ECG/medicine tests: ordered and independent interpretation performed. Decision-making details documented in ED Course.  Risk Prescription drug management. Decision regarding hospitalization.    Shortness of breath differential diagnosis considered  Likely COPD exacerbation, will obtain a chest x-ray to rule out pneumothorax, pneumonia, pulmonary edema.  Given the fluctuating and indolent process, PE is felt to be less likely.  No chest pain concerning for ACS.  No history of heart failure and no peripheral edema.  Patient is finishing up his second DuoNeb.  EKG without acute ischemic changes, dysrhythmias or blocks.  CBC with leukocytosis.  No anemia.  CMP without significant  electrolyte derangements or renal sufficiency. VBG without respiratory acidosis.  Chest x-ray without evidence of pneumonia, pneumothorax, pulmonary edema pleural effusions.  After several hours of monitoring, patient began to have increased work of breathing requiring additional breathing treatments.  Admitted to the hospitalist service for further management.    Final Clinical Impression(s) / ED Diagnoses Final diagnoses:  COPD exacerbation (HCC)    This chart was dictated using voice recognition software.  Despite best efforts to proofread,  errors can occur which can change the documentation meaning.    Lindle Rhea, MD 09/09/23 [phone]

## 2023-09-08 NOTE — ED Provider Notes (Incomplete)
 [facility] Provider Note  CSN: [id number] Arrival date & time: 09/08/23 2337  Chief Complaint(s) Shortness of Breath  HPI [name] [name]. is a 63 y.o. male {Add pertinent medical, surgical, social history, OB history to HPI:1}    Shortness of Breath   Past Medical History Past Medical History:  Diagnosis Date  . Asthma 04/10/1997  . 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 COPD (chronic obstructive pulmonary disease) (HCC)    on 4L home O2  . Essential hypertension   . Gout   . Thrombocytopenia (HCC) 07/28/2012   Patient Active Problem List   Diagnosis Date Noted  . Pulmonary nodules 08/01/2023  . Respiratory distress 07/31/2023  . Viral respiratory infection 06/14/2023  . Acute on chronic hypoxic respiratory failure (HCC) 06/13/2023  . Acute on chronic respiratory failure with hypoxia (HCC) 05/10/2022  . COVID-19 virus infection 05/10/2022  . Statin intolerance 04/20/2021  . Hyperlipidemia 04/19/2021  . Acute hypoxemic respiratory failure due to COVID-19 (HCC) 12/04/2020  . 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  . Chronic respiratory failure with hypoxia (HCC) 10/30/2016  . Class 2 obesity 09/17/2015  . Bilateral carpal tunnel syndrome 09/09/2015  . Ulnar neuropathy of both upper extremities 09/09/2015  . Numbness of left hand 01/21/2015  . Abnormal EKG 10/27/2014  . Moderate to severe pulmonary hypertension (HCC) 12/14/2013  . COPD exacerbation (HCC) 12/11/2013  . Cocaine abuse in remission (HCC) 12/11/2013  . Hypertension 12/11/2013  . COPD bronchitis 08/08/2012  . Gout 08/08/2012  . CKD (chronic kidney disease) stage 2, GFR 60-89 ml/min 08/03/2012  . Tobacco abuse, in remission 07/29/2012  . CAP (community acquired pneumonia) 07/28/2012   Home Medication(s) Prior to Admission medications   Medication Sig Start Date End Date Taking? Authorizing Provider  albuterol  (PROVENTIL ) (2.5 MG/3ML) 0.083% nebulizer  solution TAKE 3 MLS (2.5 MG TOTAL) BY NEBULIZATION EVERY 6 (SIX) HOURS AS NEEDED FOR SHORTNESS OF BREATH. 07/11/23   [name], PA-C  albuterol  (VENTOLIN  HFA) 108 (90 Base) MCG/ACT inhaler Inhale 2 puffs into the lungs every 6 (six) hours as needed for wheezing or shortness of breath 07/11/23   [name], PA-C  allopurinol  (ZYLOPRIM ) 100 MG tablet Take 1 tablet (100 mg total) by mouth daily. 07/11/23   [name], PA-C  amLODipine  (NORVASC ) 10 MG tablet Take 1 tablet (10 mg total) by mouth daily. 07/11/23   [name], PA-C  Ascorbic Acid  (VITAMIN C ) 1000 MG tablet Take 1,000 mg by mouth daily.    [provider]  azithromycin  (ZITHROMAX ) 500 MG tablet Take 1 tablet (500 mg total) by mouth daily. Patient not taking: Reported on 09/07/2023 08/07/23   Singh, Prashant K, MD  budesonide -formoterol  (SYMBICORT ) 160-4.5 MCG/ACT inhaler Inhale 2 puffs into the lungs 2 (two) times daily. 06/12/23   Mannam, Praveen, MD  dicyclomine  (BENTYL ) 20 MG tablet Take 1 tablet (20 mg total) by mouth 3 (three) times daily as needed for spasms. 08/30/23   Long, Shereen Dike, MD  ezetimibe  (ZETIA ) 10 MG tablet Take 1 tablet (10 mg total) by mouth daily. 07/11/23   McClung, Angela M, PA-C  ibuprofen  (ADVIL ) 200 MG tablet Take 800 mg by mouth daily as needed (gout pain,swelling).    [provider]  loperamide  (IMODIUM ) 2 MG capsule Take 2 capsules (4 mg total) by mouth every 6 (six) hours as needed for diarrhea or loose stools. 08/07/23   Singh, Prashant K, MD  metoprolol  succinate (TOPROL -XL) 50 MG  24 hr tablet Take 1 tablet (50 mg total) by mouth daily. 05/23/23   Newlin, Enobong, MD  predniSONE  (DELTASONE ) 5 MG tablet Take by mouth: SIX tablets daily for 5 days, then FOUR tablets daily for 5 days, then TWO tablets daily for 3 days, then ONE tablet daily for 3 days, then ONE HALF tablet daily for 3 days, then STOP. Patient not taking: Reported on 09/07/2023 08/07/23   Singh, Prashant K, MD   thiamine  (VITAMIN B-1) 100 MG tablet Take 1 tablet (100 mg total) by mouth daily. 08/07/23   Singh, Prashant K, MD  tiotropium (SPIRIVA  HANDIHALER) 18 MCG inhalation capsule PLACE 1 CAPSULE (18 MCG TOTAL) INTO INHALER AND INHALE DAILY. 07/11/23   McClung, Angela M, PA-C  vitamin B-12 (CYANOCOBALAMIN ) 500 MCG tablet Take 500 mcg by mouth daily.    [provider]  VITAMIN D PO Take 1 tablet by mouth daily.    [provider]  ALBUTEROL  IN Inhale into the lungs.  07/03/11  [provider]                                                                                                                                    Allergies Atorvastatin , Shellfish allergy, Beef-derived drug products, and Colchicine   Review of Systems Review of Systems  Respiratory:  Positive for shortness of breath.    As noted in HPI  Physical Exam Vital Signs  I have reviewed the triage vital signs Pulse 94   Resp (!) 23   Ht 5\' 6"  (1.676 m)   Wt 83.5 kg   SpO2 97%   BMI 29.70 kg/m  *** Physical Exam  ED Results and Treatments Labs (all labs ordered are listed, but only abnormal results are displayed) Labs Reviewed - No data to display                                                                                                                       EKG  EKG Interpretation Date/Time:    Ventricular Rate:    PR Interval:    QRS Duration:    QT Interval:    QTC Calculation:   R Axis:      Text Interpretation:         Radiology No results found.  Medications Ordered in ED Medications - No data to display Procedures Procedures  (including critical care time) Medical Decision Making / ED  Course   Medical Decision Making   ***    Final Clinical Impression(s) / ED Diagnoses Final diagnoses:  None    This chart was dictated using voice recognition software.  Despite best efforts to proofread,  errors can occur which can change the documentation meaning.

## 2023-09-09 ENCOUNTER — Emergency Department (HOSPITAL_COMMUNITY)

## 2023-09-09 DIAGNOSIS — Z8249 Family history of ischemic heart disease and other diseases of the circulatory system: Secondary | ICD-10-CM | POA: Diagnosis not present

## 2023-09-09 DIAGNOSIS — Z8616 Personal history of COVID-19: Secondary | ICD-10-CM | POA: Diagnosis not present

## 2023-09-09 DIAGNOSIS — J441 Chronic obstructive pulmonary disease with (acute) exacerbation: Secondary | ICD-10-CM | POA: Diagnosis present

## 2023-09-09 DIAGNOSIS — M109 Gout, unspecified: Secondary | ICD-10-CM | POA: Diagnosis not present

## 2023-09-09 DIAGNOSIS — I517 Cardiomegaly: Secondary | ICD-10-CM | POA: Diagnosis not present

## 2023-09-09 DIAGNOSIS — R0602 Shortness of breath: Secondary | ICD-10-CM | POA: Diagnosis not present

## 2023-09-09 DIAGNOSIS — Z79899 Other long term (current) drug therapy: Secondary | ICD-10-CM | POA: Diagnosis not present

## 2023-09-09 DIAGNOSIS — E559 Vitamin D deficiency, unspecified: Secondary | ICD-10-CM | POA: Diagnosis not present

## 2023-09-09 DIAGNOSIS — N182 Chronic kidney disease, stage 2 (mild): Secondary | ICD-10-CM | POA: Diagnosis not present

## 2023-09-09 DIAGNOSIS — Z9981 Dependence on supplemental oxygen: Secondary | ICD-10-CM | POA: Diagnosis not present

## 2023-09-09 DIAGNOSIS — J8410 Pulmonary fibrosis, unspecified: Secondary | ICD-10-CM | POA: Diagnosis not present

## 2023-09-09 DIAGNOSIS — Z87891 Personal history of nicotine dependence: Secondary | ICD-10-CM | POA: Diagnosis not present

## 2023-09-09 DIAGNOSIS — R918 Other nonspecific abnormal finding of lung field: Secondary | ICD-10-CM | POA: Diagnosis not present

## 2023-09-09 DIAGNOSIS — Z7951 Long term (current) use of inhaled steroids: Secondary | ICD-10-CM | POA: Diagnosis not present

## 2023-09-09 DIAGNOSIS — D509 Iron deficiency anemia, unspecified: Secondary | ICD-10-CM | POA: Diagnosis not present

## 2023-09-09 DIAGNOSIS — Z66 Do not resuscitate: Secondary | ICD-10-CM | POA: Diagnosis not present

## 2023-09-09 DIAGNOSIS — I129 Hypertensive chronic kidney disease with stage 1 through stage 4 chronic kidney disease, or unspecified chronic kidney disease: Secondary | ICD-10-CM | POA: Diagnosis not present

## 2023-09-09 DIAGNOSIS — J9611 Chronic respiratory failure with hypoxia: Secondary | ICD-10-CM | POA: Diagnosis not present

## 2023-09-09 LAB — CBC
HCT: 43.4 % (ref 39.0–52.0)
Hemoglobin: 13.1 g/dL (ref 13.0–17.0)
MCH: 23 pg — ABNORMAL LOW (ref 26.0–34.0)
MCHC: 30.2 g/dL (ref 30.0–36.0)
MCV: 76.3 fL — ABNORMAL LOW (ref 80.0–100.0)
Platelets: 293 10*3/uL (ref 150–400)
RBC: 5.69 MIL/uL (ref 4.22–5.81)
RDW: 14.9 % (ref 11.5–15.5)
WBC: 8.9 10*3/uL (ref 4.0–10.5)
nRBC: 0 % (ref 0.0–0.2)

## 2023-09-09 LAB — COMPREHENSIVE METABOLIC PANEL WITH GFR
ALT: 12 U/L (ref 0–44)
AST: 16 U/L (ref 15–41)
Albumin: 3.5 g/dL (ref 3.5–5.0)
Alkaline Phosphatase: 79 U/L (ref 38–126)
Anion gap: 10 (ref 5–15)
BUN: 13 mg/dL (ref 8–23)
CO2: 30 mmol/L (ref 22–32)
Calcium: 9.6 mg/dL (ref 8.9–10.3)
Chloride: 98 mmol/L (ref 98–111)
Creatinine, Ser: 1.11 mg/dL (ref 0.61–1.24)
GFR, Estimated: >60 mL/min (ref >60)
Glucose, Bld: 111 mg/dL — ABNORMAL HIGH (ref 70–99)
Potassium: 3.5 mmol/L (ref 3.5–5.1)
Sodium: 138 mmol/L (ref 135–145)
Total Bilirubin: 0.6 mg/dL (ref 0.0–1.2)
Total Protein: 7.4 g/dL (ref 6.5–8.1)

## 2023-09-09 LAB — I-STAT VENOUS BLOOD GAS, ED
Acid-Base Excess: 3 mmol/L — ABNORMAL HIGH (ref 0.0–2.0)
Bicarbonate: 28.7 mmol/L — ABNORMAL HIGH (ref 20.0–28.0)
Calcium, Ion: 1.18 mmol/L (ref 1.15–1.40)
HCT: 45 % (ref 39.0–52.0)
Hemoglobin: 15.3 g/dL (ref 13.0–17.0)
O2 Saturation: 87 %
Potassium: 4.4 mmol/L (ref 3.5–5.1)
Sodium: 137 mmol/L (ref 135–145)
TCO2: 30 mmol/L (ref 22–32)
pCO2, Ven: 47.1 mmHg (ref 44–60)
pH, Ven: 7.392 (ref 7.25–7.43)
pO2, Ven: 54 mmHg — ABNORMAL HIGH (ref 32–45)

## 2023-09-09 LAB — CBC WITH DIFFERENTIAL/PLATELET
Abs Immature Granulocytes: 0.07 10*3/uL (ref 0.00–0.07)
Basophils Absolute: 0.1 10*3/uL (ref 0.0–0.1)
Basophils Relative: 0 %
Eosinophils Absolute: 0.2 10*3/uL (ref 0.0–0.5)
Eosinophils Relative: 1 %
HCT: 44.5 % (ref 39.0–52.0)
Hemoglobin: 13.4 g/dL (ref 13.0–17.0)
Immature Granulocytes: 1 %
Lymphocytes Relative: 22 %
Lymphs Abs: 3.3 10*3/uL (ref 0.7–4.0)
MCH: 23 pg — ABNORMAL LOW (ref 26.0–34.0)
MCHC: 30.1 g/dL (ref 30.0–36.0)
MCV: 76.3 fL — ABNORMAL LOW (ref 80.0–100.0)
Monocytes Absolute: 1.9 10*3/uL — ABNORMAL HIGH (ref 0.1–1.0)
Monocytes Relative: 13 %
Neutro Abs: 9.4 10*3/uL — ABNORMAL HIGH (ref 1.7–7.7)
Neutrophils Relative %: 63 %
Platelets: 289 10*3/uL (ref 150–400)
RBC: 5.83 MIL/uL — ABNORMAL HIGH (ref 4.22–5.81)
RDW: 14.9 % (ref 11.5–15.5)
WBC: 14.8 10*3/uL — ABNORMAL HIGH (ref 4.0–10.5)
nRBC: 0 % (ref 0.0–0.2)

## 2023-09-09 LAB — RESP PANEL BY RT-PCR (RSV, FLU A&B, COVID)  RVPGX2
Influenza A by PCR: NEGATIVE
Influenza B by PCR: NEGATIVE
Resp Syncytial Virus by PCR: NEGATIVE
SARS Coronavirus 2 by RT PCR: NEGATIVE

## 2023-09-09 LAB — BRAIN NATRIURETIC PEPTIDE: B Natriuretic Peptide: 41.8 pg/mL (ref 0.0–100.0)

## 2023-09-09 LAB — CREATININE, SERUM
Creatinine, Ser: 1.14 mg/dL (ref 0.61–1.24)
GFR, Estimated: >60 mL/min (ref >60)

## 2023-09-09 MED ORDER — EZETIMIBE 10 MG PO TABS
10.0000 mg | ORAL_TABLET | Freq: Every day | ORAL | Status: DC
  Administered 2023-09-09 – 2023-09-14 (×6): 10 mg via ORAL
  Filled 2023-09-09 (×6): qty 1

## 2023-09-09 MED ORDER — METOPROLOL SUCCINATE ER 50 MG PO TB24
50.0000 mg | ORAL_TABLET | Freq: Every day | ORAL | Status: DC
  Administered 2023-09-09 – 2023-09-14 (×6): 50 mg via ORAL
  Filled 2023-09-09: qty 1
  Filled 2023-09-09: qty 2
  Filled 2023-09-09 (×4): qty 1

## 2023-09-09 MED ORDER — ENOXAPARIN SODIUM 40 MG/0.4ML IJ SOSY
40.0000 mg | PREFILLED_SYRINGE | INTRAMUSCULAR | Status: DC
  Administered 2023-09-09 – 2023-09-14 (×6): 40 mg via SUBCUTANEOUS
  Filled 2023-09-09 (×6): qty 0.4

## 2023-09-09 MED ORDER — AMLODIPINE BESYLATE 10 MG PO TABS
10.0000 mg | ORAL_TABLET | Freq: Every day | ORAL | Status: DC
  Administered 2023-09-09 – 2023-09-14 (×6): 10 mg via ORAL
  Filled 2023-09-09 (×4): qty 1
  Filled 2023-09-09: qty 2
  Filled 2023-09-09: qty 1

## 2023-09-09 MED ORDER — MONTELUKAST SODIUM 10 MG PO TABS
10.0000 mg | ORAL_TABLET | Freq: Every day | ORAL | Status: DC
  Administered 2023-09-10 – 2023-09-13 (×4): 10 mg via ORAL
  Filled 2023-09-09 (×5): qty 1

## 2023-09-09 MED ORDER — FLUTICASONE FUROATE-VILANTEROL 200-25 MCG/ACT IN AEPB
1.0000 | INHALATION_SPRAY | Freq: Every day | RESPIRATORY_TRACT | Status: DC
  Administered 2023-09-09 – 2023-09-14 (×6): 1 via RESPIRATORY_TRACT
  Filled 2023-09-09 (×2): qty 28

## 2023-09-09 MED ORDER — ALBUTEROL SULFATE (2.5 MG/3ML) 0.083% IN NEBU
10.0000 mg/h | INHALATION_SOLUTION | RESPIRATORY_TRACT | Status: DC
  Administered 2023-09-09: 10 mg/h via RESPIRATORY_TRACT
  Filled 2023-09-09: qty 12

## 2023-09-09 MED ORDER — METHYLPREDNISOLONE SODIUM SUCC 125 MG IJ SOLR
60.0000 mg | Freq: Two times a day (BID) | INTRAMUSCULAR | Status: DC
  Administered 2023-09-09 – 2023-09-10 (×4): 60 mg via INTRAVENOUS
  Filled 2023-09-09 (×4): qty 2

## 2023-09-09 MED ORDER — SODIUM CHLORIDE 0.9 % IV SOLN
1.0000 g | Freq: Every day | INTRAVENOUS | Status: DC
  Administered 2023-09-09 – 2023-09-14 (×6): 1 g via INTRAVENOUS
  Filled 2023-09-09 (×6): qty 10

## 2023-09-09 MED ORDER — DICYCLOMINE HCL 20 MG PO TABS
20.0000 mg | ORAL_TABLET | Freq: Three times a day (TID) | ORAL | Status: DC | PRN
  Administered 2023-09-11 – 2023-09-14 (×3): 20 mg via ORAL
  Filled 2023-09-09 (×4): qty 1

## 2023-09-09 MED ORDER — THIAMINE MONONITRATE 100 MG PO TABS
100.0000 mg | ORAL_TABLET | Freq: Every day | ORAL | Status: DC
  Administered 2023-09-09 – 2023-09-14 (×6): 100 mg via ORAL
  Filled 2023-09-09 (×6): qty 1

## 2023-09-09 NOTE — ED Notes (Signed)
 Pt requesting CPAP machine, This RN notified RT

## 2023-09-09 NOTE — Progress Notes (Signed)
   09/09/23 2218  BiPAP/CPAP/SIPAP  $ Non-Invasive Home Ventilator  Initial  BiPAP/CPAP/SIPAP Pt Type Adult  BiPAP/CPAP/SIPAP Resmed  Mask Type Full face mask  Mask Size Large  Respiratory Rate 18 breaths/min  EPAP  (8,20)  Flow Rate 8 lpm  Patient Home Machine No  Patient Home Mask No  Patient Home Tubing No  Auto Titrate Yes  Minimum cmH2O 20 cmH2O  Maximum cmH2O 8 cmH2O  Nasal massage performed No (comment)  Device Plugged into RED Power Outlet Yes  BiPAP/CPAP /SiPAP Vitals  Pulse Rate 79  Resp 18  SpO2 100 %

## 2023-09-09 NOTE — H&P (Addendum)
 History and Physical    Patient: [name] [name]. ZHY:[id number] DOB: [birth year] DOA: 09/08/2023 DOS: the patient was seen and examined on 09/09/2023 PCP: Joaquin Mulberry, MD  Patient coming from: Home  Chief Complaint:  Chief Complaint  Patient presents with   Shortness of Breath   HPI: [name] [name]. is a 63 y.o. male with medical history significant of COPD (on 10L home O2), HTN, CKD, and gout p/w AECOPD.  Pt states he was in his USOH until 2 days pta. Pt states that for the past two days he has required increased supplemental O2 (NOTE: He has two O2 concentrators at home and normally keeps each on 5L/min so that he receives approx 10L/min). However, 2 days ago he started having to bump the tanks up to 7L/min and 8L/min for a total of 15L/min. He did this throughout the day because when he attempted to titrate his O2 requirement back down to the typical total 10L/min his O2 saturation would decline within minutes to as low as 40. He noted that he was able to sleep OK with his CPAP and O2 bled in on Friday night, but because he was afraid to sleep with his O2 routinely dropping for the second day he presented to the ED on Saturday evening at 2000. Of note, pt reports excellent compliance with his pta inhalers, but has been unable to see OP Pulm due to his frequent, nearly monthly, admissions for AECOPD.  In the ED, pt was tachypneic and required 10L HFNC. Labs notable for VBG 7.36/58, Cr 1.11, and WBC 14.8. RVP neg for influenza, RSV, and SARS-CoV-2. CXR w/o focal consolidation. Pt placed on IV CTX and IV steroids for AECOPD and admitted to medicine.  Review of Systems: As mentioned in the history of present illness. All other systems reviewed and are negative. 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 His smoking use included cigarettes. He started smoking about 49 years ago. He has a 45 pack-year smoking history. He has never used smokeless tobacco. He reports current alcohol use. He reports that he does not use drugs.  Allergies  Allergen Reactions   Atorvastatin  Itching and Swelling    Facial, tongue swelling   Shellfish Allergy Anaphylaxis   Beef-Derived Drug Products     Intolerance due to gout   Colchicine  Hives    Family History  Problem Relation Age of Onset   Cancer Mother        colon    Heart disease Father    Colon cancer Other        mother ? age of diagnosis    Healthy Son    Healthy Daughter     Prior to Admission medications   Medication Sig Start Date End Date Taking? Authorizing Provider  albuterol  (PROVENTIL ) (2.5 MG/3ML) 0.083% nebulizer solution TAKE 3 MLS (2.5 MG TOTAL) BY NEBULIZATION EVERY 6 (SIX) HOURS AS NEEDED FOR SHORTNESS OF BREATH. 07/11/23   [name], PA-C  albuterol  (VENTOLIN  HFA) 108 (90 Base)  MCG/ACT inhaler Inhale 2 puffs into the lungs every 6 (six) hours as needed for wheezing or shortness of breath 07/11/23   [name], PA-C  allopurinol  (ZYLOPRIM ) 100 MG tablet Take 1 tablet (100 mg total) by mouth daily. 07/11/23   [name], PA-C  amLODipine  (NORVASC ) 10 MG tablet Take 1 tablet (10 mg total) by mouth daily. 07/11/23   [name], PA-C  Ascorbic Acid  (VITAMIN C ) 1000 MG tablet Take 1,000 mg by mouth daily.    [provider]  azithromycin  (ZITHROMAX ) 500 MG tablet Take 1 tablet (500 mg total) by mouth daily. Patient not taking: Reported on 09/07/2023 08/07/23   Singh, Prashant K, MD  budesonide -formoterol  (SYMBICORT ) 160-4.5 MCG/ACT inhaler Inhale 2 puffs into the lungs 2  (two) times daily. 06/12/23   Mannam, Praveen, MD  dicyclomine  (BENTYL ) 20 MG tablet Take 1 tablet (20 mg total) by mouth 3 (three) times daily as needed for spasms. 08/30/23   Long, Shereen Dike, MD  ezetimibe  (ZETIA ) 10 MG tablet Take 1 tablet (10 mg total) by mouth daily. 07/11/23   [name], PA-C  ibuprofen  (ADVIL ) 200 MG tablet Take 800 mg by mouth daily as needed (gout pain,swelling).    [provider]  loperamide  (IMODIUM ) 2 MG capsule Take 2 capsules (4 mg total) by mouth every 6 (six) hours as needed for diarrhea or loose stools. 08/07/23   Singh, Prashant K, MD  metoprolol  succinate (TOPROL -XL) 50 MG 24 hr tablet Take 1 tablet (50 mg total) by mouth daily. 05/23/23   Newlin, Enobong, MD  predniSONE  (DELTASONE ) 5 MG tablet Take by mouth: SIX tablets daily for 5 days, then FOUR tablets daily for 5 days, then TWO tablets daily for 3 days, then ONE tablet daily for 3 days, then ONE HALF tablet daily for 3 days, then STOP. Patient not taking: Reported on 09/07/2023 08/07/23   Singh, Prashant K, MD  thiamine  (VITAMIN B-1) 100 MG tablet Take 1 tablet (100 mg total) by mouth daily. 08/07/23   Singh, Prashant K, MD  tiotropium (SPIRIVA  HANDIHALER) 18 MCG inhalation capsule PLACE 1 CAPSULE (18 MCG TOTAL) INTO INHALER AND INHALE DAILY. 07/11/23   [name], PA-C  vitamin B-12 (CYANOCOBALAMIN ) 500 MCG tablet Take 500 mcg by mouth daily.    [provider]  VITAMIN D PO Take 1 tablet by mouth daily.    [provider]  ALBUTEROL  IN Inhale into the lungs.  07/03/11  [provider]    Physical Exam: Vitals:   09/09/23 0530 09/09/23 0545 09/09/23 0634 09/09/23 0638  BP: 120/86     Pulse: 78 78  78  Resp: 20 20  (!) 24  Temp:    97.8 F (36.6 C)  TempSrc:    Axillary  SpO2: 96% 97% 95% 95%  Weight:      Height:       General: Alert, oriented x3, resting comfortably in no acute distress HEENT: EOMI, oropharynx clear, moist mucous membranes, hearing  intact Neck: Trachea midline and no gross thyromegaly Respiratory: Lungs clear to auscultation bilaterally with normal respiratory effort; no w/r/r Cardiovascular: Regular rate and rhythm w/o m/r/g Abdomen: Soft, nontender, nondistended. Positive bowel sounds MSK: No obvious joint deformities or swelling Skin: No obvious rashes or lesions Neurologic: Awake, alert, spontaneously moves all extremities, strength intact Psychiatric: Appropriate mood and affect, conversational and cooperative  Data Reviewed:  Lab Results  Component Value Date   WBC 14.8 (H) 09/08/2023   HGB 15.3  09/08/2023   HCT 45.0 09/08/2023   MCV 76.3 (L) 09/08/2023   PLT 289 09/08/2023   Lab Results  Component Value Date   GLUCOSE 111 (H) 09/08/2023   CALCIUM  9.6 09/08/2023   NA 138 09/08/2023   K 3.5 09/08/2023   CO2 30 09/08/2023   CL 98 09/08/2023   BUN 13 09/08/2023   CREATININE 1.11 09/08/2023   Lab Results  Component Value Date   ALT 12 09/08/2023   AST 16 09/08/2023   ALKPHOS 79 09/08/2023   BILITOT 0.6 09/08/2023   Lab Results  Component Value Date   INR 1.06 10/26/2014    Radiology: DG Chest Port 1 View Result Date: 09/09/2023 CLINICAL DATA:  Shortness of breath EXAM: PORTABLE CHEST 1 VIEW COMPARISON:  08/30/2023 FINDINGS: Cardiac enlargement. No vascular congestion. Emphysematous changes in the lungs with bullous changes in the upper lungs. Coarse interstitial infiltrates in the lung bases most likely representing chronic fibrosis but could indicate edema. Similar appearance to previous study. No developing consolidation. No pleural effusion or pneumothorax. Mediastinal contours appear intact. IMPRESSION: Cardiac enlargement. Emphysematous changes and fibrosis in the lungs. No focal consolidation. Electronically Signed   By: Boyce Byes M.D.   On: 09/09/2023 00:34    Assessment and Plan: 19M h/o COPD (on 10L home O2), HTN, CKD, and gout p/w AECOPD.  AECOPD -RT consult for pulmonary  toiletry; apprec eval/recs -IV CTX 1g daily x5 days -Continue IV prednisolone  125mg  BID for now iso AECOPD -Breo Ellipta  daily in place of pta Symbicort /Spiriva ; consider IP Pulm consult to optimize OP meds given frequent admissions -Start montelukast 10mg  at bedtime -Incentive spirometry and flutter valve prn -Duonebs prn -F/u BNP and diurese prn -Wean O2 as tolerated -Ambulatory pulse prior to d/c  HTN -PTA amlodipine  10mg  daily and metoprolol  XL 50mg  daily  CKD -Daily BMP -Renally dose medications for CrCl -Avoid lovenox , NSAIDs, morphine , Fleet's phosphate enema, regular insulin , contrast; no gadolinium for MRI to avoid nephrogenic systemic fibrosis   Advance Care Planning:   Code Status: Limited: Do not attempt resuscitation (DNR) -DNR-LIMITED -Do Not Intubate/DNI    Consults: N/A  Family Communication: N/A  Severity of Illness: The appropriate patient status for this patient is INPATIENT. Inpatient status is judged to be reasonable and necessary in order to provide the required intensity of service to ensure the patient's safety. The patient's presenting symptoms, physical exam findings, and initial radiographic and laboratory data in the context of their chronic comorbidities is felt to place them at high risk for further clinical deterioration. Furthermore, it is not anticipated that the patient will be medically stable for discharge from the hospital within 2 midnights of admission.   * I certify that at the point of admission it is my clinical judgment that the patient will require inpatient hospital care spanning beyond 2 midnights from the point of admission due to high intensity of service, high risk for further deterioration and high frequency of surveillance required.*   ------- I spent 55 minutes reviewing previous labs/notes, obtaining separate history at the bedside, counseling/discussing the treatment plan outlined above, ordering medications/tests, and performing  clinical documentation.  Author: Arne Langdon, MD 09/09/2023 7:34 AM  For on call review [email or web address].

## 2023-09-09 NOTE — Care Plan (Signed)
 Signout  [name] [name] is a 63 year old male with medical history significant for COPD, chronic hypoxic respiratory failure on 10 L/min of oxygen  at home, hypertension, pulmonary hypertension, chronic kidney disease who presents on account of worsening shortness of breath.  Symptoms consistent with his COPD flare.  CTA last done on 07/31/2023 ruled out pulm emboli.  But did reveal advanced emphysema.  VBG on this admission looks stable.  No CO2 retention.  Labs are unremarkable except for WBC of 14.8.  Chest x-ray again revealed emphysematous changes and fibrosis.  No focal consolidation.

## 2023-09-10 DIAGNOSIS — J441 Chronic obstructive pulmonary disease with (acute) exacerbation: Secondary | ICD-10-CM | POA: Diagnosis not present

## 2023-09-10 LAB — BLOOD GAS, VENOUS
Acid-Base Excess: 8.2 mmol/L — ABNORMAL HIGH (ref 0.0–2.0)
Bicarbonate: 34.4 mmol/L — ABNORMAL HIGH (ref 20.0–28.0)
O2 Saturation: 73.9 %
Patient temperature: 37
pCO2, Ven: 53 mmHg (ref 44–60)
pH, Ven: 7.42 (ref 7.25–7.43)
pO2, Ven: 45 mmHg (ref 32–45)

## 2023-09-10 LAB — RESPIRATORY PANEL BY PCR

## 2023-09-10 LAB — FOLATE: Folate: 8.6 ng/mL (ref >5.9)

## 2023-09-10 LAB — CBC
HCT: 39.9 % (ref 39.0–52.0)
Hemoglobin: 12.5 g/dL — ABNORMAL LOW (ref 13.0–17.0)
MCH: 23.6 pg — ABNORMAL LOW (ref 26.0–34.0)
MCHC: 31.3 g/dL (ref 30.0–36.0)
MCV: 75.4 fL — ABNORMAL LOW (ref 80.0–100.0)
Platelets: 309 10*3/uL (ref 150–400)
RBC: 5.29 MIL/uL (ref 4.22–5.81)
RDW: 14.8 % (ref 11.5–15.5)
WBC: 17.3 10*3/uL — ABNORMAL HIGH (ref 4.0–10.5)
nRBC: 0 % (ref 0.0–0.2)

## 2023-09-10 LAB — BASIC METABOLIC PANEL WITH GFR
Anion gap: 8 (ref 5–15)
BUN: 17 mg/dL (ref 8–23)
CO2: 29 mmol/L (ref 22–32)
Calcium: 9.3 mg/dL (ref 8.9–10.3)
Chloride: 99 mmol/L (ref 98–111)
Creatinine, Ser: 1.1 mg/dL (ref 0.61–1.24)
GFR, Estimated: >60 mL/min (ref >60)
Glucose, Bld: 122 mg/dL — ABNORMAL HIGH (ref 70–99)
Potassium: 4.4 mmol/L (ref 3.5–5.1)
Sodium: 136 mmol/L (ref 135–145)

## 2023-09-10 LAB — IRON AND TIBC
Iron: 66 ug/dL (ref 45–182)
Saturation Ratios: 17 % — ABNORMAL LOW (ref 17.9–39.5)
TIBC: 398 ug/dL (ref 250–450)
UIBC: 332 ug/dL

## 2023-09-10 LAB — VITAMIN B12: Vitamin B-12: 2981 pg/mL — ABNORMAL HIGH (ref 180–914)

## 2023-09-10 LAB — PHOSPHORUS: Phosphorus: 4.5 mg/dL (ref 2.5–4.6)

## 2023-09-10 LAB — MAGNESIUM: Magnesium: 2.3 mg/dL (ref 1.7–2.4)

## 2023-09-10 LAB — VITAMIN D 25 HYDROXY (VIT D DEFICIENCY, FRACTURES): Vit D, 25-Hydroxy: 11.75 ng/mL — ABNORMAL LOW (ref 30–100)

## 2023-09-10 MED ORDER — UMECLIDINIUM BROMIDE 62.5 MCG/ACT IN AEPB
1.0000 | INHALATION_SPRAY | Freq: Every day | RESPIRATORY_TRACT | Status: DC
  Administered 2023-09-11 – 2023-09-14 (×4): 1 via RESPIRATORY_TRACT
  Filled 2023-09-10: qty 7

## 2023-09-10 MED ORDER — ROFLUMILAST 500 MCG PO TABS
250.0000 ug | ORAL_TABLET | Freq: Every day | ORAL | Status: DC
  Administered 2023-09-10: 250 ug via ORAL
  Filled 2023-09-10 (×2): qty 1

## 2023-09-10 MED ORDER — ONDANSETRON HCL 4 MG/2ML IJ SOLN: 4.0000 mg | Freq: Four times a day (QID) | INTRAMUSCULAR | Status: DC | PRN

## 2023-09-10 MED ORDER — IPRATROPIUM-ALBUTEROL 0.5-2.5 (3) MG/3ML IN SOLN
3.0000 mL | RESPIRATORY_TRACT | Status: DC | PRN
  Administered 2023-09-10 – 2023-09-13 (×5): 3 mL via RESPIRATORY_TRACT
  Filled 2023-09-10 (×5): qty 3

## 2023-09-10 MED ORDER — ALLOPURINOL 100 MG PO TABS
100.0000 mg | ORAL_TABLET | Freq: Every day | ORAL | Status: DC
  Administered 2023-09-10 – 2023-09-14 (×5): 100 mg via ORAL
  Filled 2023-09-10 (×5): qty 1

## 2023-09-10 MED ORDER — ACETAMINOPHEN 325 MG PO TABS: 650.0000 mg | ORAL_TABLET | Freq: Four times a day (QID) | ORAL | Status: DC | PRN

## 2023-09-10 MED ORDER — GUAIFENESIN ER 600 MG PO TB12
600.0000 mg | ORAL_TABLET | Freq: Two times a day (BID) | ORAL | Status: DC
  Administered 2023-09-10 – 2023-09-14 (×9): 600 mg via ORAL
  Filled 2023-09-10 (×9): qty 1

## 2023-09-10 NOTE — Plan of Care (Signed)
   Problem: Education: Goal: Knowledge of General Education information will improve Description: Including pain rating scale, medication(s)/side effects and non-pharmacologic comfort measures Outcome: Progressing   Problem: Clinical Measurements: Goal: Ability to maintain clinical measurements within normal limits will improve Outcome: Progressing Goal: Diagnostic test results will improve Outcome: Progressing

## 2023-09-10 NOTE — ED Notes (Signed)
 Breakfast given, switched pt from cpap back to 10L [place].  No distress, await confirmation that pt may go to room

## 2023-09-10 NOTE — ED Notes (Addendum)
 Pt states he wears 10L 02 hfnc at baseline and has for roughly 8 months now, also wears cpap at night. MD request attempting to titrate down on hfnc, pt initially refuses and states "No it always drops a few minutes later." Pt titrated down to 6.5L and maintains sat >93%. Pt refuses further titration and c/o SOB. Provider updated

## 2023-09-10 NOTE — Progress Notes (Signed)
 [facility] Progress Note  Patient: [name] [name].    NGE:[id number]  DOA: 09/08/2023     Date of Service: the patient was seen and examined on 09/10/2023  Chief Complaint  Patient presents with   Shortness of Breath   Brief hospital course:  [name] Bellis. is a 63 y.o. male with medical history significant of COPD (on 10L home O2), HTN, CKD, and gout p/w AECOPD.   Pt states he was in his USOH until 2 days pta. Pt states that for the past two days he has required increased supplemental O2 (NOTE: He has two O2 concentrators at home and normally keeps each on 5L/min so that he receives approx 10L/min). However, 2 days ago he started having to bump the tanks up to 7L/min and 8L/min for a total of 15L/min. He did this throughout the day because when he attempted to titrate his O2 requirement back down to the typical total 10L/min his O2 saturation would decline within minutes to as low as 40. He noted that he was able to sleep OK with his CPAP and O2 bled in on Friday night, but because he was afraid to sleep with his O2 routinely dropping for the second day he presented to the ED on Saturday evening at 2000. Of note, pt reports excellent compliance with his pta inhalers, but has been unable to see OP Pulm due to his frequent, nearly monthly, admissions for AECOPD.   In the ED, pt was tachypneic and required 10L HFNC. Labs notable for VBG 7.36/58, Cr 1.11, and WBC 14.8. RVP neg for influenza, RSV, and SARS-CoV-2. CXR w/o focal consolidation. Pt placed on IV CTX and IV steroids for AECOPD and admitted to medicine.  Assessment and Plan:  AECOPD -RT consult for pulmonary toiletry; apprec eval/recs -IV CTX 1g daily x5 days -Continue Solu-Medrol  IV 60 mg every 12 hourly, taper off after improvement  -Breo Ellipta  daily in place of pta Symbicort /Spiriva ;  Started Incruse Ellipta  inhaler May need Pulm consult to optimize OP meds given frequent admissions -Start montelukast 10 mg at bedtime 6/2  started Daliresp 25 mg p.o. daily, Mucinex  600 mg p.o. twice daily -Incentive spirometry and flutter valve prn -Duonebs prn -F/u BNP and diurese prn -Wean O2 as tolerated -Ambulatory pulse prior to d/c   HTN -PTA amlodipine  10mg  daily and metoprolol  XL 50mg  daily   CKD -Daily BMP -Renally dose medications for CrCl -Avoid lovenox , NSAIDs, morphine , Fleet's phosphate enema, regular insulin , contrast; no gadolinium for MRI to avoid nephrogenic systemic fibrosis      Body mass index is 29.7 kg/m.  Interventions:  Diet: Regular diet DVT Prophylaxis: Subcutaneous Lovenox    Advance goals of care discussion: DNR/DNI-limited  Family Communication: family was not present at bedside, at the time of interview.  The pt provided permission to discuss medical plan with the family. Opportunity was given to ask question and all questions were answered satisfactorily.   Disposition:  Pt is from home, admitted with COPD exacerbation, still has shortness of breath, which precludes a safe discharge. Discharge to home, when stable, may need few days to improve.  Subjective: No significant events overnight, patient still has significant shortness of breath, denies any chest pain or palpitations.  Physical Exam: General: Mild to moderate respiratory distress affect appropriate Eyes: PERRLA ENT: Oral Mucosa Clear, moist  Neck: no JVD,  Cardiovascular: S1 and S2 Present, no Murmur,  Respiratory: good respiratory effort, Bilateral Air entry equal and Decreased, no Crackles, no wheezes Abdomen: Bowel Sound  present, Soft and no tenderness,  Skin: no rashes Extremities: no Pedal edema, no calf tenderness Neurologic: without any new focal findings Gait not checked due to patient safety concerns  Vitals:   09/10/23 0745 09/10/23 0815 09/10/23 1105 09/10/23 1108  BP: 119/81 139/77  136/83  Pulse: 70 93  84  Resp: 20 20  (!) 21  Temp: 98.2 F (36.8 C)   97.7 F (36.5 C)  TempSrc: Oral    Axillary  SpO2: 98% (!) 89% 96% 96%  Weight:      Height:       No intake or output data in the 24 hours ending 09/10/23 1458 Filed Weights   09/08/23 2342  Weight: 83.5 kg    Data Reviewed: I have personally reviewed and interpreted daily labs, tele strips, imagings as discussed above. I reviewed all nursing notes, pharmacy notes, vitals, pertinent old records I have discussed plan of care as described above with RN and patient/family.  CBC: Recent Labs  Lab 09/08/23 2345 09/08/23 2352 09/09/23 0732 09/09/23 1039 09/10/23 0549  WBC 14.8*  --  8.9  --  17.3*  NEUTROABS 9.4*  --   --   --   --   HGB 13.4 15.3 13.1 15.3 12.5*  HCT 44.5 45.0 43.4 45.0 39.9  MCV 76.3*  --  76.3*  --  75.4*  PLT 289  --  293  --  309   Basic Metabolic Panel: Recent Labs  Lab 09/08/23 2345 09/08/23 2352 09/09/23 0732 09/09/23 1039 09/10/23 0549 09/10/23 0909  NA 138 138  --  137 136  --   K 3.5 3.5  --  4.4 4.4  --   CL 98  --   --   --  99  --   CO2 30  --   --   --  29  --   GLUCOSE 111*  --   --   --  122*  --   BUN 13  --   --   --  17  --   CREATININE 1.11  --  1.14  --  1.10  --   CALCIUM  9.6  --   --   --  9.3  --   MG  --   --   --   --   --  2.3  PHOS  --   --   --   --   --  4.5    Studies: No results found.  Scheduled Meds:  allopurinol   100 mg Oral Daily   amLODipine   10 mg Oral Daily   enoxaparin  (LOVENOX ) injection  40 mg Subcutaneous Q24H   ezetimibe   10 mg Oral Daily   fluticasone  furoate-vilanterol  1 puff Inhalation Daily   guaiFENesin   600 mg Oral BID   methylPREDNISolone  (SOLU-MEDROL ) injection  60 mg Intravenous Q12H   metoprolol  succinate  50 mg Oral Daily   montelukast  10 mg Oral QHS   roflumilast  250 mcg Oral Daily   thiamine   100 mg Oral Daily   Continuous Infusions:  cefTRIAXone  (ROCEPHIN )  IV 1 g (09/10/23 1104)   PRN Meds: dicyclomine , ipratropium-albuterol   Time spent: 55 minutes  Author: Althia Atlas. MD [facility] 09/10/2023  2:58 PM  To reach On-call, see care teams to locate the attending and reach out to them via [email or web address]. If 7PM-7AM, please contact night-coverage If you still have difficulty reaching the attending provider, please page the [facility] (Director on Call) for [facility] on  amion for assistance.

## 2023-09-10 NOTE — ED Notes (Addendum)
 Call to 4E, dm RN for confirmation that pt may come up

## 2023-09-10 NOTE — Progress Notes (Signed)
 Pt arrived from ..ED..., A/ox .4.Marland Kitchenpt denies any pain, MD aware,CCMD called. CHG bath given,no further needs at this time

## 2023-09-10 NOTE — Progress Notes (Signed)
   09/10/23 2155  BiPAP/CPAP/SIPAP  $ Non-Invasive Home Ventilator  Subsequent  BiPAP/CPAP/SIPAP Pt Type Adult  BiPAP/CPAP/SIPAP Resmed  Mask Type Full face mask  Mask Size Large  Respiratory Rate 18 breaths/min  PEEP 10 cmH20  Flow Rate 8 lpm  Patient Home Machine No  Patient Home Mask No  Patient Home Tubing No  Auto Titrate No  Device Plugged into RED Power Outlet Yes  BiPAP/CPAP /SiPAP Vitals  Pulse Rate 80  Resp 19  SpO2 100 %  MEWS Score/Color  MEWS Score 0  MEWS Score Color Marrie Sizer

## 2023-09-11 DIAGNOSIS — J441 Chronic obstructive pulmonary disease with (acute) exacerbation: Secondary | ICD-10-CM | POA: Diagnosis not present

## 2023-09-11 LAB — CBC
HCT: 45.4 % (ref 39.0–52.0)
Hemoglobin: 14 g/dL (ref 13.0–17.0)
MCH: 23.3 pg — ABNORMAL LOW (ref 26.0–34.0)
MCHC: 30.8 g/dL (ref 30.0–36.0)
MCV: 75.5 fL — ABNORMAL LOW (ref 80.0–100.0)
Platelets: 382 10*3/uL (ref 150–400)
RBC: 6.01 MIL/uL — ABNORMAL HIGH (ref 4.22–5.81)
RDW: 14.8 % (ref 11.5–15.5)
WBC: 21.4 10*3/uL — ABNORMAL HIGH (ref 4.0–10.5)
nRBC: 0 % (ref 0.0–0.2)

## 2023-09-11 LAB — MAGNESIUM: Magnesium: 2.3 mg/dL (ref 1.7–2.4)

## 2023-09-11 LAB — BASIC METABOLIC PANEL WITH GFR
Anion gap: 9 (ref 5–15)
BUN: 20 mg/dL (ref 8–23)
CO2: 29 mmol/L (ref 22–32)
Calcium: 9.3 mg/dL (ref 8.9–10.3)
Chloride: 99 mmol/L (ref 98–111)
Creatinine, Ser: 1.14 mg/dL (ref 0.61–1.24)
GFR, Estimated: >60 mL/min (ref >60)
Glucose, Bld: 113 mg/dL — ABNORMAL HIGH (ref 70–99)
Potassium: 4.2 mmol/L (ref 3.5–5.1)
Sodium: 137 mmol/L (ref 135–145)

## 2023-09-11 LAB — PHOSPHORUS: Phosphorus: 4.3 mg/dL (ref 2.5–4.6)

## 2023-09-11 MED ORDER — VITAMIN D (ERGOCALCIFEROL) 1.25 MG (50000 UNIT) PO CAPS
50000.0000 [IU] | ORAL_CAPSULE | ORAL | Status: DC
  Administered 2023-09-11: 50000 [IU] via ORAL
  Filled 2023-09-11: qty 1

## 2023-09-11 MED ORDER — POLYSACCHARIDE IRON COMPLEX 150 MG PO CAPS
150.0000 mg | ORAL_CAPSULE | Freq: Every day | ORAL | Status: DC
  Administered 2023-09-11 – 2023-09-14 (×4): 150 mg via ORAL
  Filled 2023-09-11 (×4): qty 1

## 2023-09-11 MED ORDER — METHYLPREDNISOLONE SODIUM SUCC 40 MG IJ SOLR
40.0000 mg | INTRAMUSCULAR | Status: AC
  Administered 2023-09-12: 40 mg via INTRAVENOUS
  Filled 2023-09-11: qty 1

## 2023-09-11 MED ORDER — VITAMIN C 500 MG PO TABS
500.0000 mg | ORAL_TABLET | Freq: Every day | ORAL | Status: DC
  Administered 2023-09-11 – 2023-09-14 (×4): 500 mg via ORAL
  Filled 2023-09-11 (×4): qty 1

## 2023-09-11 MED ORDER — METHYLPREDNISOLONE SODIUM SUCC 40 MG IJ SOLR
40.0000 mg | Freq: Two times a day (BID) | INTRAMUSCULAR | Status: AC
  Administered 2023-09-11 (×2): 40 mg via INTRAVENOUS
  Filled 2023-09-11 (×2): qty 1

## 2023-09-11 MED ORDER — ROFLUMILAST 500 MCG PO TABS
250.0000 ug | ORAL_TABLET | Freq: Every day | ORAL | Status: DC
  Administered 2023-09-11 – 2023-09-14 (×4): 250 ug via ORAL
  Filled 2023-09-11 (×4): qty 1

## 2023-09-11 MED ORDER — PREDNISONE 20 MG PO TABS
40.0000 mg | ORAL_TABLET | Freq: Every day | ORAL | Status: DC
  Administered 2023-09-13 – 2023-09-14 (×2): 40 mg via ORAL
  Filled 2023-09-11: qty 4
  Filled 2023-09-11: qty 2

## 2023-09-11 MED ORDER — PREDNISONE 20 MG PO TABS: 30.0000 mg | ORAL_TABLET | Freq: Every day | ORAL | Status: DC

## 2023-09-11 MED ORDER — ROFLUMILAST 500 MCG PO TABS: 500.0000 ug | ORAL_TABLET | Freq: Every day | ORAL | Status: DC

## 2023-09-11 MED ORDER — LOPERAMIDE HCL 2 MG PO CAPS
2.0000 mg | ORAL_CAPSULE | Freq: Three times a day (TID) | ORAL | Status: DC | PRN
  Administered 2023-09-11 – 2023-09-14 (×4): 2 mg via ORAL
  Filled 2023-09-11 (×4): qty 1

## 2023-09-11 MED ORDER — PREDNISONE 10 MG PO TABS: 10.0000 mg | ORAL_TABLET | Freq: Every day | ORAL | Status: DC

## 2023-09-11 MED ORDER — PREDNISONE 20 MG PO TABS: 20.0000 mg | ORAL_TABLET | Freq: Every day | ORAL | Status: DC

## 2023-09-11 NOTE — Progress Notes (Signed)
 [facility] Progress Note  Patient: [name] [name].    ZOX:[id number]  DOA: 09/08/2023     Date of Service: the patient was seen and examined on 09/11/2023  Chief Complaint  Patient presents with   Shortness of Breath   Brief hospital course:  Namon Villarin. is a 63 y.o. male with medical history significant of COPD (on 10L home O2), HTN, CKD, and gout p/w AECOPD.   Pt states he was in his USOH until 2 days pta. Pt states that for the past two days he has required increased supplemental O2 (NOTE: He has two O2 concentrators at home and normally keeps each on 5L/min so that he receives approx 10L/min). However, 2 days ago he started having to bump the tanks up to 7L/min and 8L/min for a total of 15L/min. He did this throughout the day because when he attempted to titrate his O2 requirement back down to the typical total 10L/min his O2 saturation would decline within minutes to as low as 40. He noted that he was able to sleep OK with his CPAP and O2 bled in on Friday night, but because he was afraid to sleep with his O2 routinely dropping for the second day he presented to the ED on Saturday evening at 2000. Of note, pt reports excellent compliance with his pta inhalers, but has been unable to see OP Pulm due to his frequent, nearly monthly, admissions for AECOPD.   In the ED, pt was tachypneic and required 10L HFNC. Labs notable for VBG 7.36/58, Cr 1.11, and WBC 14.8. RVP neg for influenza, RSV, and SARS-CoV-2. CXR w/o focal consolidation. Pt placed on IV CTX and IV steroids for AECOPD and admitted to medicine.  Assessment and Plan:  AECOPD -RT consult for pulmonary toiletry; apprec eval/recs -IV CTX 1g daily x5 days -s/p Solu-Medrol  IV 60 mg q12h, started tapering off IV Solu-Medrol  followed by oral prednisone . -Breo Ellipta  daily in place of pta Symbicort /Spiriva ;  Started Incruse Ellipta  inhaler May need Pulm consult to optimize OP meds given frequent admissions -Start montelukast 10  mg at bedtime 6/2 started Daliresp 25 mg p.o. daily x 4 wks and then 50 mg po daily Mucinex  600 mg p.o. twice daily -Incentive spirometry and flutter valve prn -Duonebs prn -F/u BNP and diurese prn -Wean O2 as tolerated -Ambulatory pulse prior to d/c   HTN -PTA amlodipine  10mg  daily and metoprolol  XL 50mg  daily   CKD -Daily BMP -Renally dose medications for CrCl -Avoid lovenox , NSAIDs, morphine , Fleet's phosphate enema, regular insulin , contrast; no gadolinium for MRI to avoid nephrogenic systemic fibrosis    # Iron deficiency, transferrin sat 17%, slightly low, started oral iron supplement with vitamin C   # Vitamin D deficiency: started vitamin D 50,000 units p.o. weekly, follow with PCP to repeat vitamin D level after 3 to 6 months.    Body mass index is 29.7 kg/m.  Interventions:  Diet: Regular diet DVT Prophylaxis: Subcutaneous Lovenox    Advance goals of care discussion: DNR/DNI-limited  Family Communication: family was not present at bedside, at the time of interview.  The pt provided permission to discuss medical plan with the family. Opportunity was given to ask question and all questions were answered satisfactorily.   Disposition:  Pt is from home, admitted with COPD exacerbation, still has shortness of breath, which precludes a safe discharge. Discharge to home, when stable, may need few days to improve.  Subjective: No significant events overnight, patient still has significant shortness of breath, using delta  oxygen  at rest and oxygen  drops on ambulation.  Patient may need 1-2 more days to improve.  We will continue to monitor.  Physical Exam: General: Mild to moderate respiratory distress affect appropriate Eyes: PERRLA ENT: Oral Mucosa Clear, moist  Neck: no JVD,  Cardiovascular: S1 and S2 Present, no Murmur,  Respiratory: good respiratory effort, Bilateral Air entry equal and Decreased, no Crackles, no wheezes Abdomen: Bowel Sound present, Soft and no  tenderness,  Skin: no rashes Extremities: no Pedal edema, no calf tenderness Neurologic: without any new focal findings Gait not checked due to patient safety concerns  Vitals:   09/11/23 0444 09/11/23 1059 09/11/23 1106 09/11/23 1110  BP:    (!) 125/98  Pulse: 74   76  Resp: (!) 24   (!) 25  Temp:    97.8 F (36.6 C)  TempSrc:    Oral  SpO2: 97% 100% 94% 95%  Weight:      Height:        Intake/Output Summary (Last 24 hours) at 09/11/2023 1404 Last data filed at 09/11/2023 0400 Gross per 24 hour  Intake 915.69 ml  Output 900 ml  Net 15.69 ml   Filed Weights   09/08/23 2342  Weight: 83.5 kg    Data Reviewed: I have personally reviewed and interpreted daily labs, tele strips, imagings as discussed above. I reviewed all nursing notes, pharmacy notes, vitals, pertinent old records I have discussed plan of care as described above with RN and patient/family.  CBC: Recent Labs  Lab 09/08/23 2345 09/08/23 2352 09/09/23 0732 09/09/23 1039 09/10/23 0549 09/11/23 0341  WBC 14.8*  --  8.9  --  17.3* 21.4*  NEUTROABS 9.4*  --   --   --   --   --   HGB 13.4 15.3 13.1 15.3 12.5* 14.0  HCT 44.5 45.0 43.4 45.0 39.9 45.4  MCV 76.3*  --  76.3*  --  75.4* 75.5*  PLT 289  --  293  --  309 382   Basic Metabolic Panel: Recent Labs  Lab 09/08/23 2345 09/08/23 2352 09/09/23 0732 09/09/23 1039 09/10/23 0549 09/10/23 0909 09/11/23 0341  NA 138 138  --  137 136  --  137  K 3.5 3.5  --  4.4 4.4  --  4.2  CL 98  --   --   --  99  --  99  CO2 30  --   --   --  29  --  29  GLUCOSE 111*  --   --   --  122*  --  113*  BUN 13  --   --   --  17  --  20  CREATININE 1.11  --  1.14  --  1.10  --  1.14  CALCIUM  9.6  --   --   --  9.3  --  9.3  MG  --   --   --   --   --  2.3 2.3  PHOS  --   --   --   --   --  4.5 4.3    Studies: No results found.  Scheduled Meds:  allopurinol   100 mg Oral Daily   amLODipine   10 mg Oral Daily   ascorbic acid   500 mg Oral Daily   enoxaparin   (LOVENOX ) injection  40 mg Subcutaneous Q24H   ezetimibe   10 mg Oral Daily   fluticasone  furoate-vilanterol  1 puff Inhalation Daily   guaiFENesin   600 mg Oral  BID   iron polysaccharides  150 mg Oral Daily   methylPREDNISolone  (SOLU-MEDROL ) injection  40 mg Intravenous Q12H   Followed by   Cecily Cohen ON 09/12/2023] methylPREDNISolone  (SOLU-MEDROL ) injection  40 mg Intravenous Q24H   Followed by   Cecily Cohen ON 09/13/2023] predniSONE   40 mg Oral Q breakfast   Followed by   Cecily Cohen ON 09/16/2023] predniSONE   30 mg Oral Q breakfast   Followed by   Cecily Cohen ON 09/19/2023] predniSONE   20 mg Oral Q breakfast   Followed by   Cecily Cohen ON 09/22/2023] predniSONE   10 mg Oral Q breakfast   metoprolol  succinate  50 mg Oral Daily   montelukast  10 mg Oral QHS   roflumilast  250 mcg Oral Daily   Followed by   Cecily Cohen ON 10/11/2023] roflumilast  500 mcg Oral Daily   thiamine   100 mg Oral Daily   umeclidinium bromide   1 puff Inhalation Daily   Vitamin D (Ergocalciferol)  50,000 Units Oral Q7 days   Continuous Infusions:  cefTRIAXone  (ROCEPHIN )  IV 1 g (09/11/23 1015)   PRN Meds: acetaminophen , dicyclomine , ipratropium-albuterol , loperamide , ondansetron  (ZOFRAN ) IV  Time spent: 40 minutes  Author: Althia Atlas. MD [facility] 09/11/2023 2:04 PM  To reach On-call, see care teams to locate the attending and reach out to them via [email or web address]. If 7PM-7AM, please contact night-coverage If you still have difficulty reaching the attending provider, please page the [facility] (Director on Call) for [facility] on amion for assistance.

## 2023-09-11 NOTE — Progress Notes (Signed)
 Pt has expressed multiple concerns this shift with his cpap; RT consulted via phone multiple times. RT to bedside multiple times. Pt is concerned about his oxygen  levels/co2 - Pt is not in any respiratory distress, no complaints at this time. CPAP functional, settings verified with RT.   Pt has had a soft, formed/mushy bowel movement. Pt describes it as diarrhea with an upset stomach - has PRN bentyl  from home medications, but pt states that he doesn't take it, and instead takes immodium. Pt declines any abdominal pain, and unable to expound on abd sensation.   Pt resting comfortably at the bedside, HFNC replaced, eating a snack, and playing a game on his phone.

## 2023-09-11 NOTE — Progress Notes (Signed)
   09/11/23 1626  TOC Brief Assessment  Insurance and Status Reviewed  Patient has primary care physician Yes  Home environment has been reviewed home w/ daughter  Prior level of function: self (has home 60 w/ [employer])  Prior/Current Home Services Current home services (Active w/ [facility] for HHRN/PT/SW, Palliative w/ [facility])  Social Drivers of Health Review SDOH reviewed no interventions necessary  Readmission risk has been reviewed Yes  Transition of care needs transition of care needs identified, TOC will continue to follow    TOC will follow for resumption of HH- will need new orders for RN/PT/SW- family will need to bring transport 02.

## 2023-09-11 NOTE — Plan of Care (Signed)

## 2023-09-12 DIAGNOSIS — J441 Chronic obstructive pulmonary disease with (acute) exacerbation: Secondary | ICD-10-CM | POA: Diagnosis not present

## 2023-09-12 MED ORDER — BENZONATATE 100 MG PO CAPS: 100.0000 mg | ORAL_CAPSULE | Freq: Once | ORAL | Status: DC | PRN

## 2023-09-12 MED ORDER — IPRATROPIUM-ALBUTEROL 0.5-2.5 (3) MG/3ML IN SOLN
3.0000 mL | Freq: Two times a day (BID) | RESPIRATORY_TRACT | Status: DC
  Administered 2023-09-12 – 2023-09-14 (×3): 3 mL via RESPIRATORY_TRACT
  Filled 2023-09-12 (×4): qty 3

## 2023-09-12 NOTE — Progress Notes (Signed)
 PROGRESS NOTE    [name] [name].  AOZ:[id number] DOB: 11/27/1960 DOA: 09/08/2023 PCP: Joaquin Mulberry, MD   Brief Narrative:  This 63 y.o. male with medical history significant of COPD (on 10L home O2), HTN, CKD, and gout presented in the ED with AECOPD. Pt states that for the past two days he has required increased supplemental O2 (NOTE: He has two O2 concentrators at home and normally keeps each on 5L/min so that he receives approx 10L/min). However, 2 days ago he started having to bump the tanks up to 7L/min and 8L/min for a total of 15L/min. He did this throughout the day because when he attempted to titrate his O2 requirement back down to the typical total 10L/min his O2 saturation would decline within minutes to as low as 40.  Patient was admitted for further evaluation.  He required 10 L high flow nasal cannula.  Started on empiric antibiotics,  IV ceftriaxone  and IV Solu-Medrol  for acute exacerbation of COPD.  Assessment & Plan:   Principal Problem:   COPD with acute exacerbation (HCC) Active Problems:   COPD exacerbation (HCC)   COPD with acute exacerbation: Continue IV Solu-Medrol  followed by oral prednisone . Continue IV ceftriaxone  for 5 days. Continue Breo Ellipta  daily in place of pta Symbicort /Spiriva ;  Continue Incruse Ellipta  inhaler May need Pulm consult to optimize OP meds given frequent admissions Continue  montelukast 10 mg at bedtime. 6/2 started Daliresp 25 mg p.o. daily x 4 wks and then 50 mg po daily Mucinex  600 mg p.o. twice daily -Incentive spirometry and flutter valve prn Continue Duonebs prn. -F/u BNP and diurese prn. -Wean O2 as tolerated Ambulatory pulse prior to d/c   HTN Continue amlodipine  10mg  daily and metoprolol  XL 50mg  daily   CKD: Renally dose medications for CrCl Avoid lovenox , NSAIDs, morphine , Fleet's phosphate enema, regular insulin , contrast; no gadolinium for MRI to avoid nephrogenic systemic fibrosis    # Iron deficiency Anemia,   transferrin sat 17%, slightly low, started oral iron supplement with vitamin C    # Vitamin D deficiency:  started vitamin D 50,000 units p.o. weekly, follow with PCP to repeat vitamin D level after 3 to 6 months.    DVT prophylaxis: Lovenox  Code Status:DNR Family Communication: No family at bed side. Disposition Plan:    Status is: Inpatient Remains inpatient appropriate because: Severity of illness.   Consultants:  None  Procedures: None  Antimicrobials: Anti-infectives (From admission, onward)    Start     Dose/Rate Route Frequency Ordered Stop   09/09/23 0800  cefTRIAXone  (ROCEPHIN ) 1 g in sodium chloride  0.9 % 100 mL IVPB        1 g 200 mL/hr over 30 Minutes Intravenous Daily 09/09/23 0700        Subjective: Patient was seen and examined at bedside.  Overnight events noted.   Patient was sitting comfortably on the bed , states he is feeling better but he still has mild wheezing.  Objective: Vitals:   09/12/23 0746 09/12/23 0832 09/12/23 1000 09/12/23 1142  BP: 126/86   131/86  Pulse: 81 79 84 81  Resp: 20  (!) 24 20  Temp: 97.7 F (36.5 C)   98 F (36.7 C)  TempSrc: Axillary   Oral  SpO2: 93%  97% 100%  Weight:      Height:        Intake/Output Summary (Last 24 hours) at 09/12/2023 1344 Last data filed at 09/12/2023 0900 Gross per 24 hour  Intake 340 ml  Output  2100 ml  Net -1760 ml   Filed Weights   09/08/23 2342  Weight: 83.5 kg    Examination:  General exam: Appears calm and comfortable, not in any acute distress. Respiratory system: Mild wheezing noted on exam. Respiratory effort normal. RR 14 Cardiovascular system: S1 & S2 heard, RRR. No JVD, murmurs, rubs, gallops or clicks.  Gastrointestinal system: Abdomen is non distended, soft and non tender.  Normal bowel sounds heard. Central nervous system: Alert and oriented X 3. No focal neurological deficits. Extremities: No edema, no cyanosis, no clubbing Skin: No rashes, lesions or  ulcers Psychiatry: Judgement and insight appear normal. Mood & affect appropriate.     Data Reviewed: I have personally reviewed following labs and imaging studies  CBC: Recent Labs  Lab 09/08/23 2345 09/08/23 2352 09/09/23 0732 09/09/23 1039 09/10/23 0549 09/11/23 0341  WBC 14.8*  --  8.9  --  17.3* 21.4*  NEUTROABS 9.4*  --   --   --   --   --   HGB 13.4 15.3 13.1 15.3 12.5* 14.0  HCT 44.5 45.0 43.4 45.0 39.9 45.4  MCV 76.3*  --  76.3*  --  75.4* 75.5*  PLT 289  --  293  --  309 382   Basic Metabolic Panel: Recent Labs  Lab 09/08/23 2345 09/08/23 2352 09/09/23 0732 09/09/23 1039 09/10/23 0549 09/10/23 0909 09/11/23 0341  NA 138 138  --  137 136  --  137  K 3.5 3.5  --  4.4 4.4  --  4.2  CL 98  --   --   --  99  --  99  CO2 30  --   --   --  29  --  29  GLUCOSE 111*  --   --   --  122*  --  113*  BUN 13  --   --   --  17  --  20  CREATININE 1.11  --  1.14  --  1.10  --  1.14  CALCIUM  9.6  --   --   --  9.3  --  9.3  MG  --   --   --   --   --  2.3 2.3  PHOS  --   --   --   --   --  4.5 4.3   GFR: Estimated Creatinine Clearance: 66.4 mL/min (by C-G formula based on SCr of 1.14 mg/dL). Liver Function Tests: Recent Labs  Lab 09/08/23 2345  AST 16  ALT 12  ALKPHOS 79  BILITOT 0.6  PROT 7.4  ALBUMIN 3.5   No results for input(s): "LIPASE", "AMYLASE" in the last 168 hours. No results for input(s): "AMMONIA" in the last 168 hours. Coagulation Profile: No results for input(s): "INR", "PROTIME" in the last 168 hours. Cardiac Enzymes: No results for input(s): "CKTOTAL", "CKMB", "CKMBINDEX", "TROPONINI" in the last 168 hours. BNP (last 3 results) No results for input(s): "PROBNP" in the last 8760 hours. HbA1C: No results for input(s): "HGBA1C" in the last 72 hours. CBG: No results for input(s): "GLUCAP" in the last 168 hours. Lipid Profile: No results for input(s): "CHOL", "HDL", "LDLCALC", "TRIG", "CHOLHDL", "LDLDIRECT" in the last 72 hours. Thyroid   Function Tests: No results for input(s): "TSH", "T4TOTAL", "FREET4", "T3FREE", "THYROIDAB" in the last 72 hours. Anemia Panel: Recent Labs    09/10/23 0909  VITAMINB12 2,981*  FOLATE 8.6  TIBC 398  IRON 66   Sepsis Labs: No results for input(s): "PROCALCITON", "LATICACIDVEN" in  the last 168 hours.  Recent Results (from the past 240 hours)  Resp panel by RT-PCR (RSV, Flu A&B, Covid) Anterior Nasal Swab     Status: None   Collection Time: 09/08/23 11:52 PM   Specimen: Anterior Nasal Swab  Result Value Ref Range Status   SARS Coronavirus 2 by RT PCR NEGATIVE NEGATIVE Final   Influenza A by PCR NEGATIVE NEGATIVE Final   Influenza B by PCR NEGATIVE NEGATIVE Final    Comment: (NOTE) The Xpert Xpress SARS-CoV-2/FLU/RSV plus assay is intended as an aid in the diagnosis of influenza from Nasopharyngeal swab specimens and should not be used as a sole basis for treatment. Nasal washings and aspirates are unacceptable for Xpert Xpress SARS-CoV-2/FLU/RSV testing.  Fact Sheet for Patients: BloggerCourse.com  Fact Sheet for Healthcare Providers: SeriousBroker.it  This test is not yet approved or cleared by the [place]  FDA and has been authorized for detection and/or diagnosis of SARS-CoV-2 by FDA under an Emergency Use Authorization (EUA). This EUA will remain in effect (meaning this test can be used) for the duration of the COVID-19 declaration under Section 564(b)(1) of the Act, 21 U.S.C. section 360bbb-3(b)(1), unless the authorization is terminated or revoked.     Resp Syncytial Virus by PCR NEGATIVE NEGATIVE Final    Comment: (NOTE) Fact Sheet for Patients: BloggerCourse.com  Fact Sheet for Healthcare Providers: SeriousBroker.it  This test is not yet approved or cleared by the [place]  FDA and has been authorized for detection and/or diagnosis of SARS-CoV-2 by FDA  under an Emergency Use Authorization (EUA). This EUA will remain in effect (meaning this test can be used) for the duration of the COVID-19 declaration under Section 564(b)(1) of the Act, 21 U.S.C. section 360bbb-3(b)(1), unless the authorization is terminated or revoked.  Performed at [facility], [address] [place], [place] [address]   Respiratory (~20 pathogens) panel by PCR     Status: None   Collection Time: 09/08/23 11:52 PM   Specimen: Nasopharyngeal Swab; Respiratory  Result Value Ref Range Status   Adenovirus NOT DETECTED NOT DETECTED Final   Coronavirus 229E NOT DETECTED NOT DETECTED Final    Comment: (NOTE) The Coronavirus on the Respiratory Panel, DOES NOT test for the novel  Coronavirus (2019 nCoV)    Coronavirus HKU1 NOT DETECTED NOT DETECTED Final   Coronavirus NL63 NOT DETECTED NOT DETECTED Final   Coronavirus OC43 NOT DETECTED NOT DETECTED Final   Metapneumovirus NOT DETECTED NOT DETECTED Final   Rhinovirus / Enterovirus NOT DETECTED NOT DETECTED Final   Influenza A NOT DETECTED NOT DETECTED Final   Influenza B NOT DETECTED NOT DETECTED Final   Parainfluenza Virus 1 NOT DETECTED NOT DETECTED Final   Parainfluenza Virus 2 NOT DETECTED NOT DETECTED Final   Parainfluenza Virus 3 NOT DETECTED NOT DETECTED Final   Parainfluenza Virus 4 NOT DETECTED NOT DETECTED Final   Respiratory Syncytial Virus NOT DETECTED NOT DETECTED Final   Bordetella pertussis NOT DETECTED NOT DETECTED Final   Bordetella Parapertussis NOT DETECTED NOT DETECTED Final   Chlamydophila pneumoniae NOT DETECTED NOT DETECTED Final   Mycoplasma pneumoniae NOT DETECTED NOT DETECTED Final    Comment: Performed at [facility], [address] [place], [place] [address]    Radiology Studies: No results found.  Scheduled Meds:  allopurinol   100 mg Oral Daily   amLODipine   10 mg Oral Daily   ascorbic acid   500 mg Oral Daily   enoxaparin  (LOVENOX ) injection  40 mg Subcutaneous  Q24H    ezetimibe   10 mg Oral Daily   fluticasone  furoate-vilanterol  1 puff Inhalation Daily   guaiFENesin   600 mg Oral BID   ipratropium-albuterol   3 mL Nebulization BID   iron polysaccharides  150 mg Oral Daily   metoprolol  succinate  50 mg Oral Daily   montelukast  10 mg Oral QHS   [START ON 09/13/2023] predniSONE   40 mg Oral Q breakfast   Followed by   Cecily Cohen ON 09/16/2023] predniSONE   30 mg Oral Q breakfast   Followed by   Cecily Cohen ON 09/19/2023] predniSONE   20 mg Oral Q breakfast   Followed by   Cecily Cohen ON 09/22/2023] predniSONE   10 mg Oral Q breakfast   roflumilast  250 mcg Oral Daily   Followed by   Cecily Cohen ON 10/11/2023] roflumilast  500 mcg Oral Daily   thiamine   100 mg Oral Daily   umeclidinium bromide   1 puff Inhalation Daily   Vitamin D (Ergocalciferol)  50,000 Units Oral Q7 days   Continuous Infusions:  cefTRIAXone  (ROCEPHIN )  IV 1 g (09/12/23 1059)     LOS: 3 days    Time spent: 50 Mins    Magdalene School, MD [facility]   If 7PM-7AM, please contact night-coverage

## 2023-09-12 NOTE — Progress Notes (Signed)
 Patient c/o pressure on the cpap being to much. Made some adjustments and patient felt BIPAP with IPAP of 14cm and EPAP of 7cm was more comfortable and felt like what he uses at home.

## 2023-09-12 NOTE — Progress Notes (Signed)
 Placed patient on CPAP for the night with pressure set at 10cm and oxygen  set at 10lpm.

## 2023-09-12 NOTE — Plan of Care (Signed)

## 2023-09-13 DIAGNOSIS — J441 Chronic obstructive pulmonary disease with (acute) exacerbation: Secondary | ICD-10-CM | POA: Diagnosis not present

## 2023-09-13 NOTE — Evaluation (Signed)
 Physical Therapy Evaluation Patient Details Name: [name] [name]. MRN: [id number] DOB: Sep 25, 1960 Today's Date: 09/13/2023  History of Present Illness  63 y/o male adm 09/08/23 with SOB, acute COPD exacerbation. PMHx: COPD, on home 10L, CKD, HTN, gout.  Clinical Impression  Pt pleasant, stating 10L at home and up to 15L with activity. Pt walks with rollator and has daughter available to assist as needed. Pt with decreased activity tolerance but aware of need to monitor sPO2 and regulate speed and activity based on SPO2 status and O2 demand. Pt able to walk 40' at a time on 10L prior to desaturation and requires 15L at times to recover. Pt reports generally being able to walk 150' before fatigue but only 47' today. Will follow acutely to maximize function and encouraged daily ambulation with mobility/nursing.        If plan is discharge home, recommend the following: Assistance with cooking/housework;A little help with bathing/dressing/bathroom   Can travel by private vehicle        Equipment Recommendations None recommended by PT  Recommendations for Other Services       Functional Status Assessment Patient has had a recent decline in their functional status and/or demonstrates limited ability to make significant improvements in function in a reasonable and predictable amount of time     Precautions / Restrictions Precautions Precautions: Other (comment) Recall of Precautions/Restrictions: Intact Precaution/Restrictions Comments: watch sats      Mobility  Bed Mobility               General bed mobility comments: pt sitting EOB on arrival and end of session, denied wanting chair in room    Transfers Overall transfer level: Modified independent                      Ambulation/Gait Ambulation/Gait assistance: Supervision Gait Distance (Feet): 80 Feet Assistive device: Rolling walker (2 wheels) Gait Pattern/deviations: Step-through pattern, Decreased stride  length   Gait velocity interpretation: 1.31 - 2.62 ft/sec, indicative of limited community ambulator   General Gait Details: cues for posture and proximity to RW. supervision to manage lines and monitor O2. Pt walked 40' on 10L with desaturation from 95% to 85%. Standing rest for 2 min with increase to 15L. Pt then performed return gait 40' on 10L with SPO2 maintained at 93% and only desaturated after he returned to sitting briefly 10 sec before return to 93%  Stairs            Wheelchair Mobility     Tilt Bed    Modified Rankin (Stroke Patients Only)       Balance Overall balance assessment: Mild deficits observed, not formally tested, Modified Independent                                           Pertinent Vitals/Pain Pain Assessment Pain Assessment: No/denies pain    Home Living Family/patient expects to be discharged to:: Private residence Living Arrangements: Children Available Help at Discharge: Family;Available 24 hours/day Type of Home: House Home Access: Level entry       Home Layout: One level Home Equipment: Wheelchair - manual;Cane - single point;Rollator (4 wheels) Additional Comments: 10L at rest, reports 15L during mobility    Prior Function Prior Level of Function : Independent/Modified Independent             Mobility Comments: walks  with rollator max 150' ADLs Comments: ind ADLs, manages meds. Family assists with IADLs     Extremity/Trunk Assessment   Upper Extremity Assessment Upper Extremity Assessment: Overall WFL for tasks assessed    Lower Extremity Assessment Lower Extremity Assessment: Overall WFL for tasks assessed    Cervical / Trunk Assessment Cervical / Trunk Assessment: Normal  Communication   Communication Communication: No apparent difficulties    Cognition Arousal: Alert Behavior During Therapy: WFL for tasks assessed/performed   PT - Cognitive impairments: No apparent impairments                          Following commands: Intact       Cueing Cueing Techniques: Verbal cues     General Comments      Exercises     Assessment/Plan    PT Assessment Patient needs continued PT services  PT Problem List Decreased activity tolerance;Cardiopulmonary status limiting activity       PT Treatment Interventions DME instruction;Gait training;Therapeutic activities;Patient/family education    PT Goals (Current goals can be found in the Care Plan section)  Acute Rehab PT Goals Patient Stated Goal: return home PT Goal Formulation: With patient Time For Goal Achievement: 09/27/23 Potential to Achieve Goals: Good    Frequency Min 1X/week     Co-evaluation               AM-PAC PT "6 Clicks" Mobility  Outcome Measure Help needed turning from your back to your side while in a flat bed without using bedrails?: None Help needed moving from lying on your back to sitting on the side of a flat bed without using bedrails?: A Little Help needed moving to and from a bed to a chair (including a wheelchair)?: A Little Help needed standing up from a chair using your arms (e.g., wheelchair or bedside chair)?: None Help needed to walk in hospital room?: A Little Help needed climbing 3-5 steps with a railing? : A Little 6 Click Score: 20    End of Session Equipment Utilized During Treatment: Oxygen  Activity Tolerance: Patient tolerated treatment well Patient left: in bed;with call bell/phone within reach Nurse Communication: Mobility status PT Visit Diagnosis: Other abnormalities of gait and mobility (R26.89)    Time: 1610-9604 PT Time Calculation (min) (ACUTE ONLY): 15 min   Charges:   PT Evaluation $PT Eval Low Complexity: 1 Low   PT General Charges $$ ACUTE PT VISIT: 1 Visit         Annis Baseman, PT Acute Rehabilitation Services Office: [phone]   Jackey Mary Emalina Dubreuil 09/13/2023, 11:16 AM

## 2023-09-13 NOTE — Progress Notes (Signed)
 PROGRESS NOTE    [name] [name].  GEX:[id number] DOB: March 26, 1961 DOA: 09/08/2023 PCP: Joaquin Mulberry, MD   Brief Narrative:  This 63 y.o. male with medical history significant of COPD (on 10L home O2), HTN, CKD, and gout presented in the ED with AECOPD. Pt states that for the past two days he has required increased supplemental O2 (NOTE: He has two O2 concentrators at home and normally keeps each on 5L/min so that he receives approx 10L/min). However, 2 days ago he started having to bump the tanks up to 7L/min and 8L/min for a total of 15L/min. He did this throughout the day because when he attempted to titrate his O2 requirement back down to the typical total 10L/min his O2 saturation would decline within minutes to as low as 40.  Patient was admitted for further evaluation.  He required 10 L high flow nasal cannula.  Started on empiric antibiotics,  IV ceftriaxone  and IV Solu-Medrol  for acute exacerbation of COPD.  Assessment & Plan:   Principal Problem:   COPD with acute exacerbation (HCC) Active Problems:   COPD exacerbation (HCC)   COPD with acute exacerbation: Continue IV Solu-Medrol  followed by oral prednisone . Continue IV ceftriaxone  for 5 days. Continue Breo Ellipta  daily in place of pta Symbicort /Spiriva ;  Continue Incruse Ellipta  inhaler May need Pulm consult to optimize OP meds given frequent admissions. Continue  montelukast 10 mg at bedtime. 6/2 started Daliresp 25 mg p.o. daily x 4 wks and then 50 mg po daily Continue Mucinex  600 mg p.o. twice daily. -Incentive spirometry and flutter valve prn. -Continue Duonebs prn. -F/u BNP and diurese prn. -Wean O2 as tolerated Ambulatory pulse prior to d/c   HTN Continue amlodipine  10mg  daily and metoprolol  XL 50mg  daily.   CKD: Renally dose medications for CrCl Avoid lovenox , NSAIDs, morphine , Fleet's phosphate enema, regular insulin , contrast; no gadolinium for MRI to avoid nephrogenic systemic fibrosis    # Iron  deficiency Anemia,  transferrin sat 17%, slightly low, started oral iron supplement with vitamin C    # Vitamin D deficiency:  Continue vitamin D 50,000 units p.o. weekly, follow with PCP to repeat vitamin D level after 3 to 6 months.    DVT prophylaxis: Lovenox  Code Status:DNR Family Communication: No family at bed side. Disposition Plan:    Status is: Inpatient Remains inpatient appropriate because: Severity of illness.   Consultants:  None  Procedures: None  Antimicrobials: Anti-infectives (From admission, onward)    Start     Dose/Rate Route Frequency Ordered Stop   09/09/23 0800  cefTRIAXone  (ROCEPHIN ) 1 g in sodium chloride  0.9 % 100 mL IVPB        1 g 200 mL/hr over 30 Minutes Intravenous Daily 09/09/23 0700        Subjective: Patient was seen and examined at bedside.  Overnight events noted.   Patient was sitting comfortably on the bed , He states not feeling well, states feeling anxious   Objective: Vitals:   09/13/23 0916 09/13/23 0917 09/13/23 1104 09/13/23 1131  BP: 128/78 128/78  132/89  Pulse:  90  84  Resp: 20   17  Temp:    98.1 F (36.7 C)  TempSrc:    Oral  SpO2: 95%  95% 95%  Weight:      Height:        Intake/Output Summary (Last 24 hours) at 09/13/2023 1256 Last data filed at 09/13/2023 0900 Gross per 24 hour  Intake 340 ml  Output 2800 ml  Net -2460  ml   Filed Weights   09/08/23 2342  Weight: 83.5 kg    Examination:  General exam: Appears calm and comfortable, not in any acute distress.Deconditioned. Respiratory system: Mild wheezing noted on exam. Respiratory effort normal. RR 16 Cardiovascular system: S1 & S2 heard, RRR. No JVD, murmurs, rubs, gallops or clicks.  Gastrointestinal system: Abdomen is non distended, soft and non tender.  Normal bowel sounds heard. Central nervous system: Alert and oriented X 3. No focal neurological deficits. Extremities: No edema, no cyanosis, no clubbing Skin: No rashes, lesions or  ulcers Psychiatry: Judgement and insight appear normal. Mood & affect appropriate.     Data Reviewed: I have personally reviewed following labs and imaging studies  CBC: Recent Labs  Lab 09/08/23 2345 09/08/23 2352 09/09/23 0732 09/09/23 1039 09/10/23 0549 09/11/23 0341  WBC 14.8*  --  8.9  --  17.3* 21.4*  NEUTROABS 9.4*  --   --   --   --   --   HGB 13.4 15.3 13.1 15.3 12.5* 14.0  HCT 44.5 45.0 43.4 45.0 39.9 45.4  MCV 76.3*  --  76.3*  --  75.4* 75.5*  PLT 289  --  293  --  309 382   Basic Metabolic Panel: Recent Labs  Lab 09/08/23 2345 09/08/23 2352 09/09/23 0732 09/09/23 1039 09/10/23 0549 09/10/23 0909 09/11/23 0341  NA 138 138  --  137 136  --  137  K 3.5 3.5  --  4.4 4.4  --  4.2  CL 98  --   --   --  99  --  99  CO2 30  --   --   --  29  --  29  GLUCOSE 111*  --   --   --  122*  --  113*  BUN 13  --   --   --  17  --  20  CREATININE 1.11  --  1.14  --  1.10  --  1.14  CALCIUM  9.6  --   --   --  9.3  --  9.3  MG  --   --   --   --   --  2.3 2.3  PHOS  --   --   --   --   --  4.5 4.3   GFR: Estimated Creatinine Clearance: 66.4 mL/min (by C-G formula based on SCr of 1.14 mg/dL). Liver Function Tests: Recent Labs  Lab 09/08/23 2345  AST 16  ALT 12  ALKPHOS 79  BILITOT 0.6  PROT 7.4  ALBUMIN 3.5   No results for input(s): "LIPASE", "AMYLASE" in the last 168 hours. No results for input(s): "AMMONIA" in the last 168 hours. Coagulation Profile: No results for input(s): "INR", "PROTIME" in the last 168 hours. Cardiac Enzymes: No results for input(s): "CKTOTAL", "CKMB", "CKMBINDEX", "TROPONINI" in the last 168 hours. BNP (last 3 results) No results for input(s): "PROBNP" in the last 8760 hours. HbA1C: No results for input(s): "HGBA1C" in the last 72 hours. CBG: No results for input(s): "GLUCAP" in the last 168 hours. Lipid Profile: No results for input(s): "CHOL", "HDL", "LDLCALC", "TRIG", "CHOLHDL", "LDLDIRECT" in the last 72 hours. Thyroid   Function Tests: No results for input(s): "TSH", "T4TOTAL", "FREET4", "T3FREE", "THYROIDAB" in the last 72 hours. Anemia Panel: No results for input(s): "VITAMINB12", "FOLATE", "FERRITIN", "TIBC", "IRON", "RETICCTPCT" in the last 72 hours.  Sepsis Labs: No results for input(s): "PROCALCITON", "LATICACIDVEN" in the last 168 hours.  Recent Results (from the past  240 hours)  Resp panel by RT-PCR (RSV, Flu A&B, Covid) Anterior Nasal Swab     Status: None   Collection Time: 09/08/23 11:52 PM   Specimen: Anterior Nasal Swab  Result Value Ref Range Status   SARS Coronavirus 2 by RT PCR NEGATIVE NEGATIVE Final   Influenza A by PCR NEGATIVE NEGATIVE Final   Influenza B by PCR NEGATIVE NEGATIVE Final    Comment: (NOTE) The Xpert Xpress SARS-CoV-2/FLU/RSV plus assay is intended as an aid in the diagnosis of influenza from Nasopharyngeal swab specimens and should not be used as a sole basis for treatment. Nasal washings and aspirates are unacceptable for Xpert Xpress SARS-CoV-2/FLU/RSV testing.  Fact Sheet for Patients: BloggerCourse.com  Fact Sheet for Healthcare Providers: SeriousBroker.it  This test is not yet approved or cleared by the [place]  FDA and has been authorized for detection and/or diagnosis of SARS-CoV-2 by FDA under an Emergency Use Authorization (EUA). This EUA will remain in effect (meaning this test can be used) for the duration of the COVID-19 declaration under Section 564(b)(1) of the Act, 21 U.S.C. section 360bbb-3(b)(1), unless the authorization is terminated or revoked.     Resp Syncytial Virus by PCR NEGATIVE NEGATIVE Final    Comment: (NOTE) Fact Sheet for Patients: BloggerCourse.com  Fact Sheet for Healthcare Providers: SeriousBroker.it  This test is not yet approved or cleared by the [place]  FDA and has been authorized for detection and/or  diagnosis of SARS-CoV-2 by FDA under an Emergency Use Authorization (EUA). This EUA will remain in effect (meaning this test can be used) for the duration of the COVID-19 declaration under Section 564(b)(1) of the Act, 21 U.S.C. section 360bbb-3(b)(1), unless the authorization is terminated or revoked.  Performed at [facility], [address], [place], [place] [address]   Respiratory (~20 pathogens) panel by PCR     Status: None   Collection Time: 09/08/23 11:52 PM   Specimen: Nasopharyngeal Swab; Respiratory  Result Value Ref Range Status   Adenovirus NOT DETECTED NOT DETECTED Final   Coronavirus 229E NOT DETECTED NOT DETECTED Final    Comment: (NOTE) The Coronavirus on the Respiratory Panel, DOES NOT test for the novel  Coronavirus (2019 nCoV)    Coronavirus HKU1 NOT DETECTED NOT DETECTED Final   Coronavirus NL63 NOT DETECTED NOT DETECTED Final   Coronavirus OC43 NOT DETECTED NOT DETECTED Final   Metapneumovirus NOT DETECTED NOT DETECTED Final   Rhinovirus / Enterovirus NOT DETECTED NOT DETECTED Final   Influenza A NOT DETECTED NOT DETECTED Final   Influenza B NOT DETECTED NOT DETECTED Final   Parainfluenza Virus 1 NOT DETECTED NOT DETECTED Final   Parainfluenza Virus 2 NOT DETECTED NOT DETECTED Final   Parainfluenza Virus 3 NOT DETECTED NOT DETECTED Final   Parainfluenza Virus 4 NOT DETECTED NOT DETECTED Final   Respiratory Syncytial Virus NOT DETECTED NOT DETECTED Final   Bordetella pertussis NOT DETECTED NOT DETECTED Final   Bordetella Parapertussis NOT DETECTED NOT DETECTED Final   Chlamydophila pneumoniae NOT DETECTED NOT DETECTED Final   Mycoplasma pneumoniae NOT DETECTED NOT DETECTED Final    Comment: Performed at [facility], [address], [place], [place] [address]    Radiology Studies: No results found.  Scheduled Meds:  allopurinol   100 mg Oral Daily   amLODipine   10 mg Oral Daily   ascorbic acid   500 mg Oral Daily   enoxaparin  (LOVENOX )  injection  40 mg Subcutaneous Q24H   ezetimibe   10 mg Oral Daily  fluticasone  furoate-vilanterol  1 puff Inhalation Daily   guaiFENesin   600 mg Oral BID   ipratropium-albuterol   3 mL Nebulization BID   iron polysaccharides  150 mg Oral Daily   metoprolol  succinate  50 mg Oral Daily   montelukast  10 mg Oral QHS   predniSONE   40 mg Oral Q breakfast   Followed by   Cecily Cohen ON 09/16/2023] predniSONE   30 mg Oral Q breakfast   Followed by   Cecily Cohen ON 09/19/2023] predniSONE   20 mg Oral Q breakfast   Followed by   Cecily Cohen ON 09/22/2023] predniSONE   10 mg Oral Q breakfast   roflumilast  250 mcg Oral Daily   Followed by   Cecily Cohen ON 10/11/2023] roflumilast  500 mcg Oral Daily   thiamine   100 mg Oral Daily   umeclidinium bromide   1 puff Inhalation Daily   Vitamin D (Ergocalciferol)  50,000 Units Oral Q7 days   Continuous Infusions:  cefTRIAXone  (ROCEPHIN )  IV 1 g (09/13/23 0926)     LOS: 4 days    Time spent: 35 Mins    Magdalene School, MD [facility]   If 7PM-7AM, please contact night-coverage

## 2023-09-13 NOTE — Plan of Care (Signed)

## 2023-09-13 NOTE — Progress Notes (Signed)
 Mobility Specialist Progress Note:   09/13/23 1238  Mobility  Activity Transferred to/from [facility]  Level of Assistance Standby assist, set-up cues, supervision of patient - no hands on  Assistive Device BSC  Distance Ambulated (ft) 2 ft  Activity Response Tolerated well  Mobility Referral Yes  Mobility visit 1 Mobility  Mobility Specialist Start Time (ACUTE ONLY) 1235  Mobility Specialist Stop Time (ACUTE ONLY) 1238  Mobility Specialist Time Calculation (min) (ACUTE ONLY) 3 min   Pt received in bed, requesting assistance to transfer B>BSC. Tolerated well, used BSC for support. Increased O2 flow, SpO2 98% on 10L at rest. While ambulating, SpO2 92% on 12L. Left with all needs met, call bell in reach.   Shraga Custard Mobility Specialist Please contact via Special educational needs teacher or  Rehab office at [phone]

## 2023-09-14 ENCOUNTER — Other Ambulatory Visit (HOSPITAL_COMMUNITY): Payer: Self-pay

## 2023-09-14 DIAGNOSIS — J441 Chronic obstructive pulmonary disease with (acute) exacerbation: Secondary | ICD-10-CM | POA: Diagnosis not present

## 2023-09-14 MED ORDER — VITAMIN D (ERGOCALCIFEROL) 1.25 MG (50000 UNIT) PO CAPS
50000.0000 [IU] | ORAL_CAPSULE | ORAL | 0 refills | Status: DC
  Filled 2023-09-14: qty 12, 84d supply, fill #0

## 2023-09-14 MED ORDER — MONTELUKAST SODIUM 10 MG PO TABS
10.0000 mg | ORAL_TABLET | Freq: Every day | ORAL | 0 refills | Status: DC
  Filled 2023-09-14: qty 30, 30d supply, fill #0

## 2023-09-14 MED ORDER — PREDNISONE 10 MG PO TABS
ORAL_TABLET | ORAL | 0 refills | Status: AC
  Filled 2023-09-14: qty 30, 12d supply, fill #0

## 2023-09-14 MED ORDER — ROFLUMILAST 500 MCG PO TABS
ORAL_TABLET | ORAL | 0 refills | Status: DC
  Filled 2023-09-14: qty 15, 30d supply, fill #0
  Filled 2023-10-17 (×2): qty 15, 15d supply, fill #1
  Filled 2023-11-12: qty 15, 15d supply, fill #2

## 2023-09-14 NOTE — Progress Notes (Signed)
 Discharge instructions reviewed with pt.  Copy of instructions given to pt. [facility] has filled his scripts and will be picked up on the way out for discharge. Pt d/c'd on O2 10L/[place], family at main entrance and has his home O2 tank that staff will switch over to when pt gets in car.  Pt d/c'd via wheelchair with belongings and escorted by staff.   Abuk Selleck,RN SWOT

## 2023-09-14 NOTE — Evaluation (Signed)
 Occupational Therapy Evaluation and Discharge Patient Details Name: [name] [name]. MRN: [id number] DOB: 04-Oct-1960 Today's Date: 09/14/2023   History of Present Illness   63 y/o male adm 09/08/23 with SOB, acute COPD exacerbation. PMHx: COPD, on home 10L, CKD, HTN, gout.     Clinical Impressions At baseline pt is Independent with ADLs, Mod I for functional mobility with a Rollator, and receives assist from family for IADLs. Pt currently demonstrates ability to complete ADLs Independent to Mod I and functional mobility/transfers with a RW with Mod I. Pt HR in the upper-70s to low-100s and O2 sat >/92% on 10L continuous O2 through HFNC at rest and during functional tasks. OT educated pt in monitoring O2 sat with pulse oximeter in the home and in energy conservation strategies to increase safety and independence in the home with pt demonstrating understanding of all education through teach back. No further benefit from acute skilled OT services and no post-acute skilled OT needs anticipated at this time. OT signing off at this time.      If plan is discharge home, recommend the following:   Assistance with cooking/housework;A little help with walking and/or transfers;A little help with bathing/dressing/bathroom;Assist for transportation;Help with stairs or ramp for entrance (PRN assist)     Functional Status Assessment   Patient has had a recent decline in their functional status and demonstrates the ability to make significant improvements in function in a reasonable and predictable amount of time.     Equipment Recommendations   None recommended by OT (Pt already has needed equipment)     Recommendations for Other Services         Precautions/Restrictions   Precautions Precautions: Other (comment) Recall of Precautions/Restrictions: Intact Precaution/Restrictions Comments: watch sats Restrictions Weight Bearing Restrictions Per Provider Order: No     Mobility Bed  Mobility               General bed mobility comments: Pt sitting at EOB at beginning and end of session    Transfers Overall transfer level: Modified independent Equipment used: Rolling walker (2 wheels)                      Balance Overall balance assessment: Mild deficits observed, not formally tested, Modified Independent                                         ADL either performed or assessed with clinical judgement   ADL Overall ADL's : Modified independent;Independent                                       General ADL Comments: Pt Independent to Mod I with ADLs     Vision Baseline Vision/History: 0 No visual deficits Ability to See in Adequate Light: 0 Adequate Patient Visual Report: No change from baseline       Perception         Praxis         Pertinent Vitals/Pain Pain Assessment Pain Assessment: No/denies pain     Extremity/Trunk Assessment Upper Extremity Assessment Upper Extremity Assessment: Right hand dominant;Overall [facility] for tasks assessed   Lower Extremity Assessment Lower Extremity Assessment: Defer to PT evaluation   Cervical / Trunk Assessment Cervical / Trunk Assessment: Normal   Communication Communication Communication: No  apparent difficulties   Cognition Arousal: Alert Behavior During Therapy: WFL for tasks assessed/performed Cognition: No apparent impairments             OT - Cognition Comments: AAOx4 and pleasant throughout session. demonstrates good safety awareness and good insight into deficits                 Following commands: Intact       Cueing  General Comments   Cueing Techniques: Verbal cues  HR in the upper-70s to low-100s and O2 sat >/92% on 10L continuous O2 through HFNC at rest and during functional tasks.   Exercises     Shoulder Instructions      Home Living Family/patient expects to be discharged to:: Private residence Living  Arrangements: Children Available Help at Discharge: Family;Available 24 hours/day Type of Home: House Home Access: Level entry     Home Layout: One level     Bathroom Shower/Tub: Sponge bathes at baseline   Bathroom Toilet: Standard Bathroom Accessibility: Yes How Accessible: Accessible via walker Home Equipment: Wheelchair - manual;Cane - single point;Rollator (4 wheels)   Additional Comments: 10L at rest, reports 15L during mobility      Prior Functioning/Environment Prior Level of Function : Independent/Modified Independent             Mobility Comments: walks with rollator max 150' ADLs Comments: ind ADLs, manages meds. Family assists with IADLs    OT Problem List: Cardiopulmonary status limiting activity   OT Treatment/Interventions:        OT Goals(Current goals can be found in the care plan section)   Acute Rehab OT Goals Patient Stated Goal: to return home OT Goal Formulation: With patient   OT Frequency:       Co-evaluation              AM-PAC OT "6 Clicks" Daily Activity     Outcome Measure Help from another person eating meals?: None Help from another person taking care of personal grooming?: None Help from another person toileting, which includes using toliet, bedpan, or urinal?: None Help from another person bathing (including washing, rinsing, drying)?: None Help from another person to put on and taking off regular upper body clothing?: None Help from another person to put on and taking off regular lower body clothing?: None 6 Click Score: 24   End of Session Equipment Utilized During Treatment: Rolling walker (2 wheels);Oxygen  Nurse Communication: Mobility status;Other (comment) (Pt performing ADLs Indepenendent to Mod I. Pt O2 sat >/92% on 10 L continuous O2 through HFNC)  Activity Tolerance: Patient tolerated treatment well Patient left: in bed;with call bell/phone within reach;Other (comment) (sitting EOB)  OT Visit Diagnosis:  Other (comment) (decreased activity tolerance)                Time: 8119-1478 OT Time Calculation (min): 28 min Charges:  OT General Charges $OT Visit: 1 Visit OT Evaluation $OT Eval Low Complexity: 1 Low  Jannice Beitzel "Kyle" M., OTR/L, MA Acute Rehab [phone]   Walt Gunner 09/14/2023, 3:21 PM

## 2023-09-14 NOTE — Discharge Summary (Signed)
 Physician Discharge Summary  [name] [name]. EXB:[id number] DOB: [birth year] DOA: 09/08/2023  PCP: Joaquin Mulberry, MD  Admit date: 09/08/2023  Discharge date: 09/14/2023  Admitted From: Home  Disposition:  Home Health Services.  Recommendations for Outpatient Follow-up:  Follow up with PCP in 1-2 weeks. Please obtain BMP/CBC in one week Patient has completed treatment for COPD exacerbation. Advised to take prednisone  taper as prescribed.  Home Health: None Equipment/Devices: Home oxygen   Discharge Condition: Stable CODE STATUS:DNR Diet recommendation: Heart Healthy   Brief Summary / Hospital Course: This 63 y.o. male with medical history significant of COPD (on 10L home O2), HTN, CKD, and gout presented in the ED with AECOPD. Pt states that for the past two days,  he has required increased supplemental O2 (NOTE: He has two O2 concentrators at home and normally keeps each on 5L/min so that he receives approx 10L/min). However, 2 days ago he started having to bump the tanks up to 7L/min and 8L/min for a total of 15L/min. He did this throughout the day because when he attempted to titrate his O2 requirement back down to the typical total 10L/min his O2 saturation,  would decline within minutes to as low as 40.  Patient was admitted for further evaluation.  He required 10 L high flow nasal cannula.  Started on empiric antibiotics,  IV ceftriaxone  and IV Solu-Medrol  for acute exacerbation of COPD. Patient has made significant improvement.  He has completed antibiotics course.  Patient was subsequently transitioned to prednisone  taper. Patient back to his baseline oxygen  requirement.  He feels better and wants to be discharged home.  Discharge Diagnoses:  Principal Problem:   COPD with acute exacerbation (HCC) Active Problems:   COPD exacerbation (HCC)  COPD with acute exacerbation: Continue IV Solu-Medrol  followed by oral prednisone . Continue IV ceftriaxone  for 5 days. Continue Breo  Ellipta daily in place of pta Symbicort /Spiriva ;  Continue Incruse Ellipta  inhaler May need Pulm consult to optimize OP meds given frequent admissions. Continue  montelukast 10 mg at bedtime. 6/2 started Daliresp 25 mg p.o. daily x 4 wks and then 50 mg po daily Continue Mucinex  600 mg p.o. twice daily. -Incentive spirometry and flutter valve prn. -Continue Duonebs prn. -Wean O2 as tolerated Ambulatory pulse prior to d/c   HTN: Continue amlodipine  10mg  daily and metoprolol  XL 50mg  daily.   CKD: Renally dose medications for CrCl Avoid lovenox , NSAIDs, morphine , Fleet's phosphate enema, regular insulin , contrast; no gadolinium for MRI to avoid nephrogenic systemic fibrosis    # Iron deficiency Anemia,  transferrin sat 17%, slightly low, started oral iron supplement with vitamin C    # Vitamin D deficiency:  Continue vitamin D 50,000 units p.o. weekly, follow with PCP to repeat vitamin D level after 3 to 6 months.    Discharge Instructions:  Discharge Instructions     Call MD for:  difficulty breathing, headache or visual disturbances   Complete by: As directed    Call MD for:  persistant dizziness or light-headedness   Complete by: As directed    Call MD for:  persistant nausea and vomiting   Complete by: As directed    Diet - low sodium heart healthy   Complete by: As directed    Diet Carb Modified   Complete by: As directed    Discharge instructions   Complete by: As directed    Continues to follow-up with primary care physician in 1 week. Advised to continue current medications as prescribed.   Increase activity slowly  Complete by: As directed       Allergies as of 09/14/2023       Reactions   Lipitor [atorvastatin ] Itching, Swelling   Facial, tongue swelling   Shellfish Allergy Anaphylaxis   Beef (diagnostic) Other (See Comments)   Gout flares   Colchicine  Hives        Medication List     TAKE these medications    albuterol  (2.5 MG/3ML) 0.083%  nebulizer solution Commonly known as: PROVENTIL  TAKE 3 MLS (2.5 MG TOTAL) BY NEBULIZATION EVERY 6 (SIX) HOURS AS NEEDED FOR SHORTNESS OF BREATH. What changed:  how much to take how to take this when to take this additional instructions   Ventolin  HFA 108 (90 Base) MCG/ACT inhaler Generic drug: albuterol  Inhale 2 puffs into the lungs every 6 (six) hours as needed for wheezing or shortness of breath What changed: Another medication with the same name was changed. Make sure you understand how and when to take each.   allopurinol  100 MG tablet Commonly known as: ZYLOPRIM  Take 1 tablet (100 mg total) by mouth daily.   amLODipine  10 MG tablet Commonly known as: NORVASC  Take 1 tablet (10 mg total) by mouth daily.   dicyclomine  20 MG tablet Commonly known as: BENTYL  Take 1 tablet (20 mg total) by mouth 3 (three) times daily as needed for spasms.   ezetimibe  10 MG tablet Commonly known as: ZETIA  Take 1 tablet (10 mg total) by mouth daily.   ibuprofen  200 MG tablet Commonly known as: ADVIL  Take 800 mg by mouth 2 (two) times daily as needed for moderate pain (pain score 4-6) or headache.   metoprolol  succinate 50 MG 24 hr tablet Commonly known as: TOPROL -XL Take 1 tablet (50 mg total) by mouth daily.   montelukast 10 MG tablet Commonly known as: SINGULAIR Take 1 tablet (10 mg total) by mouth at bedtime.   predniSONE  10 MG tablet Commonly known as: DELTASONE  Take 4 tablets (40 mg total) by mouth daily with breakfast for 3 days, THEN 3 tablets (30 mg total) daily with breakfast for 3 days, THEN 2 tablets (20 mg total) daily with breakfast for 3 days, THEN 1 tablet (10 mg total) daily with breakfast for 3 days. Start taking on: September 15, 2023   roflumilast 500 MCG Tabs tablet Commonly known as: DALIRESP Take 0.5 tablets (250 mcg total) by mouth daily for 30 days, THEN 1 tablet (500 mcg total) daily. Start taking on: September 15, 2023   Spiriva  HandiHaler 18 MCG inhalation  capsule Generic drug: tiotropium PLACE 1 CAPSULE (18 MCG TOTAL) INTO INHALER AND INHALE DAILY. What changed: when to take this   Symbicort  160-4.5 MCG/ACT inhaler Generic drug: budesonide -formoterol  Inhale 2 puffs into the lungs 2 (two) times daily.   VITAMIN B-12 PO Take 1 tablet by mouth daily.   VITAMIN C  PO Take 1 tablet by mouth daily.   Vitamin D (Ergocalciferol) 1.25 MG (50000 UNIT) Caps capsule Commonly known as: DRISDOL Take 1 capsule (50,000 Units total) by mouth every 7 (seven) days. Start taking on: September 18, 2023        Follow-up Information     Joaquin Mulberry, MD Follow up in 1 week(s).   Specialty: Family Medicine Contact information: [address] [place] [address] [phone]         [facility] Follow up.   Specialty: Home Health Services Why: HHRN/PT/SW services to resume Contact information: [address] [place] [address] [phone]  Allergies  Allergen Reactions   Lipitor [Atorvastatin ] Itching and Swelling    Facial, tongue swelling   Shellfish Allergy Anaphylaxis   Beef (Diagnostic) Other (See Comments)    Gout flares   Colchicine  Hives    Consultations: None   Procedures/Studies: DG Chest Port 1 View Result Date: 09/09/2023 CLINICAL DATA:  Shortness of breath EXAM: PORTABLE CHEST 1 VIEW COMPARISON:  08/30/2023 FINDINGS: Cardiac enlargement. No vascular congestion. Emphysematous changes in the lungs with bullous changes in the upper lungs. Coarse interstitial infiltrates in the lung bases most likely representing chronic fibrosis but could indicate edema. Similar appearance to previous study. No developing consolidation. No pleural effusion or pneumothorax. Mediastinal contours appear intact. IMPRESSION: Cardiac enlargement. Emphysematous changes and fibrosis in the lungs. No focal consolidation. Electronically Signed   By: Boyce Byes M.D.   On: 09/09/2023 00:34    DG Chest Portable 1 View Result Date: 08/30/2023 CLINICAL DATA:  Chest pain and shortness of breath. EXAM: PORTABLE CHEST 1 VIEW COMPARISON:  Chest radiograph dated 07/31/2023 FINDINGS: Background of emphysema. No focal consolidation, pleural effusion, or pneumothorax. Bilateral mid to lower lung field interstitial coarsening. The cardiac silhouette is within normal limits. No acute osseous pathology. IMPRESSION: 1. No active disease. 2. Emphysema. Electronically Signed   By: Angus Bark M.D.   On: 08/30/2023 11:38    Subjective: Patient seen and examined at bedside.  Overnight events noted.   Patient reports feeling much improved and wants to be discharged.  Discharge Exam: Vitals:   09/14/23 0850 09/14/23 1200  BP: 122/74 124/85  Pulse: 92 95  Resp:  (!) 22  Temp: 98.6 F (37 C) 98 F (36.7 C)  SpO2:     Vitals:   09/14/23 0322 09/14/23 0753 09/14/23 0850 09/14/23 1200  BP: 125/82 122/74 122/74 124/85  Pulse: 80 92 92 95  Resp: 20 (!) 22  (!) 22  Temp: 97.9 F (36.6 C) 98.6 F (37 C) 98.6 F (37 C) 98 F (36.7 C)  TempSrc: Oral Oral    SpO2: 98% 98%    Weight:      Height:        General: Pt is alert, awake, not in acute distress Cardiovascular: RRR, S1/S2 +, no rubs, no gallops Respiratory: CTA bilaterally, no wheezing, no rhonchi Abdominal: Soft, NT, ND, bowel sounds + Extremities: no edema, no cyanosis    The results of significant diagnostics from this hospitalization (including imaging, microbiology, ancillary and laboratory) are listed below for reference.     Microbiology: Recent Results (from the past 240 hours)  Resp panel by RT-PCR (RSV, Flu A&B, Covid) Anterior Nasal Swab     Status: None   Collection Time: 09/08/23 11:52 PM   Specimen: Anterior Nasal Swab  Result Value Ref Range Status   SARS Coronavirus 2 by RT PCR NEGATIVE NEGATIVE Final   Influenza A by PCR NEGATIVE NEGATIVE Final   Influenza B by PCR NEGATIVE NEGATIVE Final    Comment:  (NOTE) The Xpert Xpress SARS-CoV-2/FLU/RSV plus assay is intended as an aid in the diagnosis of influenza from Nasopharyngeal swab specimens and should not be used as a sole basis for treatment. Nasal washings and aspirates are unacceptable for Xpert Xpress SARS-CoV-2/FLU/RSV testing.  Fact Sheet for Patients: BloggerCourse.com  Fact Sheet for Healthcare Providers: SeriousBroker.it  This test is not yet approved or cleared by the [place]  FDA and has been authorized for detection and/or diagnosis of SARS-CoV-2 by FDA under an Emergency Use Authorization (EUA). This EUA  will remain in effect (meaning this test can be used) for the duration of the COVID-19 declaration under Section 564(b)(1) of the Act, 21 U.S.C. section 360bbb-3(b)(1), unless the authorization is terminated or revoked.     Resp Syncytial Virus by PCR NEGATIVE NEGATIVE Final    Comment: (NOTE) Fact Sheet for Patients: BloggerCourse.com  Fact Sheet for Healthcare Providers: SeriousBroker.it  This test is not yet approved or cleared by the [place]  FDA and has been authorized for detection and/or diagnosis of SARS-CoV-2 by FDA under an Emergency Use Authorization (EUA). This EUA will remain in effect (meaning this test can be used) for the duration of the COVID-19 declaration under Section 564(b)(1) of the Act, 21 U.S.C. section 360bbb-3(b)(1), unless the authorization is terminated or revoked.  Performed at [facility], [address] [place], [place] [address]   Respiratory (~20 pathogens) panel by PCR     Status: None   Collection Time: 09/08/23 11:52 PM   Specimen: Nasopharyngeal Swab; Respiratory  Result Value Ref Range Status   Adenovirus NOT DETECTED NOT DETECTED Final   Coronavirus 229E NOT DETECTED NOT DETECTED Final    Comment: (NOTE) The Coronavirus on the Respiratory Panel, DOES  NOT test for the novel  Coronavirus (2019 nCoV)    Coronavirus HKU1 NOT DETECTED NOT DETECTED Final   Coronavirus NL63 NOT DETECTED NOT DETECTED Final   Coronavirus OC43 NOT DETECTED NOT DETECTED Final   Metapneumovirus NOT DETECTED NOT DETECTED Final   Rhinovirus / Enterovirus NOT DETECTED NOT DETECTED Final   Influenza A NOT DETECTED NOT DETECTED Final   Influenza B NOT DETECTED NOT DETECTED Final   Parainfluenza Virus 1 NOT DETECTED NOT DETECTED Final   Parainfluenza Virus 2 NOT DETECTED NOT DETECTED Final   Parainfluenza Virus 3 NOT DETECTED NOT DETECTED Final   Parainfluenza Virus 4 NOT DETECTED NOT DETECTED Final   Respiratory Syncytial Virus NOT DETECTED NOT DETECTED Final   Bordetella pertussis NOT DETECTED NOT DETECTED Final   Bordetella Parapertussis NOT DETECTED NOT DETECTED Final   Chlamydophila pneumoniae NOT DETECTED NOT DETECTED Final   Mycoplasma pneumoniae NOT DETECTED NOT DETECTED Final    Comment: Performed at [facility], [address] [place], [place] [address]     Labs: BNP (last 3 results) Recent Labs    08/06/23 0439 08/30/23 1132 09/09/23 0733  BNP 18.9 24.4 41.8   Basic Metabolic Panel: Recent Labs  Lab 09/08/23 2345 09/08/23 2352 09/09/23 0732 09/09/23 1039 09/10/23 0549 09/10/23 0909 09/11/23 0341  NA 138 138  --  137 136  --  137  K 3.5 3.5  --  4.4 4.4  --  4.2  CL 98  --   --   --  99  --  99  CO2 30  --   --   --  29  --  29  GLUCOSE 111*  --   --   --  122*  --  113*  BUN 13  --   --   --  17  --  20  CREATININE 1.11  --  1.14  --  1.10  --  1.14  CALCIUM  9.6  --   --   --  9.3  --  9.3  MG  --   --   --   --   --  2.3 2.3  PHOS  --   --   --   --   --  4.5 4.3   Liver Function  Tests: Recent Labs  Lab 09/08/23 2345  AST 16  ALT 12  ALKPHOS 79  BILITOT 0.6  PROT 7.4  ALBUMIN 3.5   No results for input(s): "LIPASE", "AMYLASE" in the last 168 hours. No results for input(s): "AMMONIA" in the last 168  hours. CBC: Recent Labs  Lab 09/08/23 2345 09/08/23 2352 09/09/23 0732 09/09/23 1039 09/10/23 0549 09/11/23 0341  WBC 14.8*  --  8.9  --  17.3* 21.4*  NEUTROABS 9.4*  --   --   --   --   --   HGB 13.4 15.3 13.1 15.3 12.5* 14.0  HCT 44.5 45.0 43.4 45.0 39.9 45.4  MCV 76.3*  --  76.3*  --  75.4* 75.5*  PLT 289  --  293  --  309 382   Cardiac Enzymes: No results for input(s): "CKTOTAL", "CKMB", "CKMBINDEX", "TROPONINI" in the last 168 hours. BNP: Invalid input(s): "POCBNP" CBG: No results for input(s): "GLUCAP" in the last 168 hours. D-Dimer No results for input(s): "DDIMER" in the last 72 hours. Hgb A1c No results for input(s): "HGBA1C" in the last 72 hours. Lipid Profile No results for input(s): "CHOL", "HDL", "LDLCALC", "TRIG", "CHOLHDL", "LDLDIRECT" in the last 72 hours. Thyroid  function studies No results for input(s): "TSH", "T4TOTAL", "T3FREE", "THYROIDAB" in the last 72 hours.  Invalid input(s): "FREET3" Anemia work up No results for input(s): "VITAMINB12", "FOLATE", "FERRITIN", "TIBC", "IRON", "RETICCTPCT" in the last 72 hours. Urinalysis    Component Value Date/Time   COLORURINE YELLOW 10/30/2018 0730   APPEARANCEUR CLEAR 10/30/2018 0730   LABSPEC 1.013 10/30/2018 0730   PHURINE 6.0 10/30/2018 0730   GLUCOSEU NEGATIVE 10/30/2018 0730   HGBUR NEGATIVE 10/30/2018 0730   BILIRUBINUR NEGATIVE 10/30/2018 0730   KETONESUR NEGATIVE 10/30/2018 0730   PROTEINUR 30 (A) 10/30/2018 0730   UROBILINOGEN 0.2 12/11/2013 0951   NITRITE NEGATIVE 10/30/2018 0730   LEUKOCYTESUR NEGATIVE 10/30/2018 0730   Sepsis Labs Recent Labs  Lab 09/08/23 2345 09/09/23 0732 09/10/23 0549 09/11/23 0341  WBC 14.8* 8.9 17.3* 21.4*   Microbiology Recent Results (from the past 240 hours)  Resp panel by RT-PCR (RSV, Flu A&B, Covid) Anterior Nasal Swab     Status: None   Collection Time: 09/08/23 11:52 PM   Specimen: Anterior Nasal Swab  Result Value Ref Range Status   SARS  Coronavirus 2 by RT PCR NEGATIVE NEGATIVE Final   Influenza A by PCR NEGATIVE NEGATIVE Final   Influenza B by PCR NEGATIVE NEGATIVE Final    Comment: (NOTE) The Xpert Xpress SARS-CoV-2/FLU/RSV plus assay is intended as an aid in the diagnosis of influenza from Nasopharyngeal swab specimens and should not be used as a sole basis for treatment. Nasal washings and aspirates are unacceptable for Xpert Xpress SARS-CoV-2/FLU/RSV testing.  Fact Sheet for Patients: BloggerCourse.com  Fact Sheet for Healthcare Providers: SeriousBroker.it  This test is not yet approved or cleared by the [place]  FDA and has been authorized for detection and/or diagnosis of SARS-CoV-2 by FDA under an Emergency Use Authorization (EUA). This EUA will remain in effect (meaning this test can be used) for the duration of the COVID-19 declaration under Section 564(b)(1) of the Act, 21 U.S.C. section 360bbb-3(b)(1), unless the authorization is terminated or revoked.     Resp Syncytial Virus by PCR NEGATIVE NEGATIVE Final    Comment: (NOTE) Fact Sheet for Patients: BloggerCourse.com  Fact Sheet for Healthcare Providers: SeriousBroker.it  This test is not yet approved or cleared by the [place]  FDA and has been authorized  for detection and/or diagnosis of SARS-CoV-2 by FDA under an Emergency Use Authorization (EUA). This EUA will remain in effect (meaning this test can be used) for the duration of the COVID-19 declaration under Section 564(b)(1) of the Act, 21 U.S.C. section 360bbb-3(b)(1), unless the authorization is terminated or revoked.  Performed at [facility], [address], [place], [place] [address]   Respiratory (~20 pathogens) panel by PCR     Status: None   Collection Time: 09/08/23 11:52 PM   Specimen: Nasopharyngeal Swab; Respiratory  Result Value Ref Range Status   Adenovirus  NOT DETECTED NOT DETECTED Final   Coronavirus 229E NOT DETECTED NOT DETECTED Final    Comment: (NOTE) The Coronavirus on the Respiratory Panel, DOES NOT test for the novel  Coronavirus (2019 nCoV)    Coronavirus HKU1 NOT DETECTED NOT DETECTED Final   Coronavirus NL63 NOT DETECTED NOT DETECTED Final   Coronavirus OC43 NOT DETECTED NOT DETECTED Final   Metapneumovirus NOT DETECTED NOT DETECTED Final   Rhinovirus / Enterovirus NOT DETECTED NOT DETECTED Final   Influenza A NOT DETECTED NOT DETECTED Final   Influenza B NOT DETECTED NOT DETECTED Final   Parainfluenza Virus 1 NOT DETECTED NOT DETECTED Final   Parainfluenza Virus 2 NOT DETECTED NOT DETECTED Final   Parainfluenza Virus 3 NOT DETECTED NOT DETECTED Final   Parainfluenza Virus 4 NOT DETECTED NOT DETECTED Final   Respiratory Syncytial Virus NOT DETECTED NOT DETECTED Final   Bordetella pertussis NOT DETECTED NOT DETECTED Final   Bordetella Parapertussis NOT DETECTED NOT DETECTED Final   Chlamydophila pneumoniae NOT DETECTED NOT DETECTED Final   Mycoplasma pneumoniae NOT DETECTED NOT DETECTED Final    Comment: Performed at [facility], [address], [place], [place] [address]     Time coordinating discharge: Over 30 minutes  SIGNED:   Magdalene School, MD  [facility] 09/14/2023, 2:06 PM Pager   If 7PM-7AM, please contact night-coverage

## 2023-09-14 NOTE — Discharge Instructions (Signed)
 Follow-up with primary care physician in 1 week. Patient has completed treatment for COPD exacerbation. Advised to take prednisone  taper as prescribed.

## 2023-09-14 NOTE — TOC Transition Note (Signed)
 Transition of Care [facility]) - Discharge Note Sherin Dingwall RN, BSN Transitions of Care Unit 4E- RN Case Manager See Treatment Team for direct phone #   Patient Details  Name: [name] [name]. MRN: [id number] Date of Birth: [birth year]  Transition of Care [facility]) CM/SW Contact:  Rox Cope, RN Phone Number: 09/14/2023, 1:09 PM   Clinical Narrative:    Pt stable for transition home today, family to transport home and have 02 for transport.   Pt active with [facility] for HHRN/PT/SW- MD to place orders to resume. [facility] liaison notified of transition home to resume services.   Pt has home with baseline 10L (2 concentrators).    Final next level of care: Home w Home Health Services Barriers to Discharge: Barriers Resolved   Patient Goals and CMS Choice Patient states their goals for this hospitalization and ongoing recovery are:: return home CMS Medicare.gov Compare Post Acute Care list provided to:: Patient        Discharge Placement               Home w/ [facility]        Discharge Plan and Services Additional resources added to the After Visit Summary for     Discharge Planning Services: CM Consult Post Acute Care Choice: Home Health, Resumption of Svcs/PTA Provider          DME Arranged: N/A DME Agency: NA       HH Arranged: RN, PT, Social Work [employer] Agency: [facility] Date HH Agency Contacted: 09/14/23 Time HH Agency Contacted: 1308 Representative spoke with at [facility] Agency: Loetta Ringer  Social Drivers of Health (SDOH) Interventions SDOH Screenings   Food Insecurity: No Food Insecurity (09/10/2023)  Housing: Low Risk  (09/10/2023)  Transportation Needs: No Transportation Needs (09/10/2023)  Recent Concern: Transportation Needs - Unmet Transportation Needs (08/01/2023)  Utilities: Not At Risk (09/10/2023)  Recent Concern: Utilities - At Risk (08/01/2023)  Depression (PHQ2-9): Low Risk  (08/08/2023)  Tobacco Use: Medium Risk (09/08/2023)     Readmission Risk  Interventions    09/14/2023    1:09 PM 06/20/2023    4:32 PM  Readmission Risk Prevention Plan  Post Dischage Appt Complete Complete  Medication Screening Complete Complete  Transportation Screening Complete Complete

## 2023-09-14 NOTE — Plan of Care (Signed)

## 2023-09-14 NOTE — Progress Notes (Signed)
 Mobility Specialist Progress Note:    09/14/23 1018  Mobility  Activity Ambulated with assistance in room;Ambulated with assistance in hallway  Level of Assistance Standby assist, set-up cues, supervision of patient - no hands on  Assistive Device Front wheel walker  Distance Ambulated (ft) 100 ft  Activity Response Tolerated well  Mobility Referral Yes  Mobility visit 1 Mobility  Mobility Specialist Start Time (ACUTE ONLY) 1000  Mobility Specialist Stop Time (ACUTE ONLY) 1009  Mobility Specialist Time Calculation (min) (ACUTE ONLY) 9 min   Pt received on EOB, agreeable to mobility session. Ambulated with assistance with RW and SBA. Tolerated well. Prior to ambulation, SpO2 100% on 10L. During session, pt c/o SOB, increased O2 flow, SpO2 92% on 15L. Returned pt to room, sitting EOB, SpO2 94% 10L. Left with all needs met, call bell in reach. Eager for d/c.  Kennette Cuthrell Mobility Specialist Please contact via SecureChat or  Rehab office at [phone]

## 2023-09-16 DIAGNOSIS — I272 Pulmonary hypertension, unspecified: Secondary | ICD-10-CM | POA: Diagnosis not present

## 2023-09-16 DIAGNOSIS — F17201 Nicotine dependence, unspecified, in remission: Secondary | ICD-10-CM | POA: Diagnosis not present

## 2023-09-16 DIAGNOSIS — R911 Solitary pulmonary nodule: Secondary | ICD-10-CM | POA: Diagnosis not present

## 2023-09-16 DIAGNOSIS — Z8616 Personal history of COVID-19: Secondary | ICD-10-CM | POA: Diagnosis not present

## 2023-09-16 DIAGNOSIS — E46 Unspecified protein-calorie malnutrition: Secondary | ICD-10-CM | POA: Diagnosis not present

## 2023-09-16 DIAGNOSIS — Z792 Long term (current) use of antibiotics: Secondary | ICD-10-CM | POA: Diagnosis not present

## 2023-09-16 DIAGNOSIS — M109 Gout, unspecified: Secondary | ICD-10-CM | POA: Diagnosis not present

## 2023-09-16 DIAGNOSIS — J9621 Acute and chronic respiratory failure with hypoxia: Secondary | ICD-10-CM | POA: Diagnosis not present

## 2023-09-16 DIAGNOSIS — Z7952 Long term (current) use of systemic steroids: Secondary | ICD-10-CM | POA: Diagnosis not present

## 2023-09-16 DIAGNOSIS — E66812 Obesity, class 2: Secondary | ICD-10-CM | POA: Diagnosis not present

## 2023-09-16 DIAGNOSIS — N182 Chronic kidney disease, stage 2 (mild): Secondary | ICD-10-CM | POA: Diagnosis not present

## 2023-09-16 DIAGNOSIS — Z6829 Body mass index (BMI) 29.0-29.9, adult: Secondary | ICD-10-CM | POA: Diagnosis not present

## 2023-09-16 DIAGNOSIS — I129 Hypertensive chronic kidney disease with stage 1 through stage 4 chronic kidney disease, or unspecified chronic kidney disease: Secondary | ICD-10-CM | POA: Diagnosis not present

## 2023-09-16 DIAGNOSIS — Z556 Problems related to health literacy: Secondary | ICD-10-CM | POA: Diagnosis not present

## 2023-09-16 DIAGNOSIS — E785 Hyperlipidemia, unspecified: Secondary | ICD-10-CM | POA: Diagnosis not present

## 2023-09-16 DIAGNOSIS — J189 Pneumonia, unspecified organism: Secondary | ICD-10-CM | POA: Diagnosis not present

## 2023-09-16 DIAGNOSIS — G5603 Carpal tunnel syndrome, bilateral upper limbs: Secondary | ICD-10-CM | POA: Diagnosis not present

## 2023-09-16 DIAGNOSIS — J440 Chronic obstructive pulmonary disease with acute lower respiratory infection: Secondary | ICD-10-CM | POA: Diagnosis not present

## 2023-09-16 DIAGNOSIS — D696 Thrombocytopenia, unspecified: Secondary | ICD-10-CM | POA: Diagnosis not present

## 2023-09-16 DIAGNOSIS — J441 Chronic obstructive pulmonary disease with (acute) exacerbation: Secondary | ICD-10-CM | POA: Diagnosis not present

## 2023-09-16 DIAGNOSIS — Z7951 Long term (current) use of inhaled steroids: Secondary | ICD-10-CM | POA: Diagnosis not present

## 2023-09-16 DIAGNOSIS — Z9981 Dependence on supplemental oxygen: Secondary | ICD-10-CM | POA: Diagnosis not present

## 2023-09-17 ENCOUNTER — Telehealth: Payer: Self-pay | Admitting: Family Medicine

## 2023-09-17 ENCOUNTER — Telehealth: Payer: Self-pay

## 2023-09-17 NOTE — Telephone Encounter (Signed)
 FYI Copied from CRM [id number]. Topic: General - Other >> Sep 17, 2023  1:44 PM Essie A wrote:  Reason for CRM: Patient's nurse called to see who left voice mail.  Saw the message on her phone.

## 2023-09-17 NOTE — Telephone Encounter (Signed)
 Call returned.

## 2023-09-17 NOTE — Progress Notes (Signed)
 Complex Care Management Note  Care Guide Note 09/17/2023 Name: [name] [name]. MRN: [id number] DOB: [birth year]  [name] [name]. is a 63 y.o. year old male who sees [name] Mulberry, MD for primary care. I reached out to [name] [name]. by phone today to offer complex care management services.  [name] [name] was given information about Complex Care Management services today including:   The Complex Care Management services include support from the care team which includes your Nurse Care Manager, Clinical Social Worker, or Pharmacist.  The Complex Care Management team is here to help remove barriers to the health concerns and goals most important to you. Complex Care Management services are voluntary, and the patient may decline or stop services at any time by request to their care team member.   Complex Care Management Consent Status: Patient agreed to services and verbal consent obtained.   Follow up plan:  Telephone appointment with complex care management team member scheduled for:  09-19-23  Encounter Outcome:  Patient Scheduled   Creola Doheny [facility] Guide  Direct Dial: [phone]  Fax [phone]

## 2023-09-17 NOTE — Transitions of Care (Post Inpatient/ED Visit) (Signed)
   09/17/2023  Name: [name] [name]. MRN: [id number] DOB: 01-Jan-1961  Today's TOC FU Call Status: Today's TOC FU Call Status:: Successful TOC FU Call Completed TOC FU Call Complete Date: 09/17/23 Patient's Name and Date of Birth confirmed.  Transition Care Management Follow-up Telephone Call Date of Discharge: 09/14/23 Discharge Facility: [facility]) Type of Discharge: Inpatient Admission Primary Inpatient Discharge Diagnosis:: COPD exacerbation How have you been since you were released from the hospital?: Better Any questions or concerns?: No  Items Reviewed: Did you receive and understand the discharge instructions provided?: Yes Medications obtained,verified, and reconciled?: No Medications Not Reviewed Reasons:: Other: (He said he has all of his medications and he did not need to review the med list because the nurse just reviewed it with him at lenght yesterday. He did not have any questions about the meds and he confirmed he has a nebulizer) Any new allergies since your discharge?: No Dietary orders reviewed?: Yes Type of Diet Ordered:: heart healthy, low sodium, carb modified Do you have support at home?: Yes People in Home [RPT]: child(ren), adult Name of Support/Comfort Primary Source: his daughters and grandchildren.  Medications Reviewed Today: Medications Reviewed Today   Medications were not reviewed in this encounter     Home Care and Equipment/Supplies: Were Home Health Services Ordered?: Yes Name of Home Health Agency:: [facility] Has Agency set up a time to come to your home?: Yes First Home Health Visit Date: 09/16/23 Any new equipment or medical supplies ordered?: No  Functional Questionnaire: Do you need assistance with bathing/showering or dressing?: No Do you need assistance with meal preparation?: No Do you need assistance with eating?: No Do you have difficulty maintaining continence: No Do you need assistance with getting out of bed/getting out  of a chair/moving?: Yes (He has a RW to use as needed. He also uses O2 @ 10 L continuously. He stated sometimes he needs to increase it to 15 L.) Do you have difficulty managing or taking your medications?: No  Follow up appointments reviewed: PCP Follow-up appointment confirmed?: Yes Date of PCP follow-up appointment?: 09/21/23 Follow-up Provider: Sherwin Donate .  She is not his PCP but his HFU is schedule with her. Specialist Hospital Follow-up appointment confirmed?: Yes Date of Specialist follow-up appointment?: 11/09/23 Follow-Up Specialty Provider:: pulmonary Do you need transportation to your follow-up appointment?: No Do you understand care options if your condition(s) worsen?: Yes-patient verbalized understanding    SIGNATURE Burnett Carson, RN

## 2023-09-17 NOTE — Telephone Encounter (Signed)
 Copied from CRM [id number]. Topic: Clinical - Home Health Verbal Orders >> Sep 17, 2023 12:56 PM [place] wrote:  Caller/Agency: Dina Francisco with [facility] Callback Number: [phone] Service Requested: Skilled Nursing- COPD exacerbation Frequency: 1x wk 4 weeks Any new concerns about the patient? No

## 2023-09-17 NOTE — Telephone Encounter (Signed)
 VM was left giving verbal orders for patient.

## 2023-09-18 ENCOUNTER — Other Ambulatory Visit: Payer: Self-pay

## 2023-09-18 ENCOUNTER — Other Ambulatory Visit: Payer: Self-pay | Admitting: Physician Assistant

## 2023-09-18 ENCOUNTER — Telehealth: Payer: Self-pay | Admitting: *Deleted

## 2023-09-18 DIAGNOSIS — U071 COVID-19: Secondary | ICD-10-CM | POA: Diagnosis not present

## 2023-09-18 DIAGNOSIS — J449 Chronic obstructive pulmonary disease, unspecified: Secondary | ICD-10-CM

## 2023-09-18 MED ORDER — TIOTROPIUM BROMIDE MONOHYDRATE 18 MCG IN CAPS
1.0000 | ORAL_CAPSULE | Freq: Every day | RESPIRATORY_TRACT | 1 refills | Status: DC
  Filled 2023-09-18: qty 30, 30d supply, fill #0
  Filled 2023-11-12 (×2): qty 30, 30d supply, fill #1

## 2023-09-18 NOTE — Transitions of Care (Post Inpatient/ED Visit) (Signed)
   09/18/2023  Name: [name] [name]. MRN: [id number] DOB: 1960/11/28  Today's TOC FU Call Status: Today's TOC FU Call Status:: Successful TOC FU Call Completed TOC FU Call Complete Date: 09/18/23 Patient's Name and Date of Birth confirmed.  Transition Care Management Follow-up Telephone Call Date of Discharge: 09/14/23 (Late notification of discharge data received on 6.10.25) Discharge Facility: [facility]) Type of Discharge: Inpatient Admission Primary Inpatient Discharge Diagnosis:: COPD with acute exacerbation How have you been since you were released from the hospital?: Better Any questions or concerns?: No  Items Reviewed: Did you receive and understand the discharge instructions provided?: Yes Medications obtained,verified, and reconciled?: No (Patient did not have time to review medications today.) Medications Not Reviewed Reasons:: Other: (Patient did not have time to review medications today.) Any new allergies since your discharge?: No Dietary orders reviewed?: Yes Type of Diet Ordered:: Low sodium, Heart Healthy Do you have support at home?: Yes People in Home [RPT]: child(ren), adult Name of Support/Comfort Primary Source: Erica/Daughter  Medications Reviewed Today:Patient did not have time to review medications today. Reported having all medications and taking as directed. Medications Reviewed Today   Medications were not reviewed in this encounter     Home Care and Equipment/Supplies: Were Home Health Services Ordered?: Yes Name of Home Health Agency:: [facility] Has Agency set up a time to come to your home?: Yes First Home Health Visit Date: 09/16/23 Any new equipment or medical supplies ordered?: No  Functional Questionnaire: Do you need assistance with bathing/showering or dressing?: No Do you need assistance with meal preparation?: Yes Do you need assistance with eating?: No Do you have difficulty maintaining continence: No Do you need assistance  with getting out of bed/getting out of a chair/moving?: No (Patient utilizing RW or cane while ambulating) Do you have difficulty managing or taking your medications?: No  Follow up appointments reviewed: PCP Follow-up appointment confirmed?: Yes Date of PCP follow-up appointment?: 09/21/23 Follow-up Provider: Madelyn Schick, NP-not his regular PCP Specialist Hospital Follow-up appointment confirmed?: Yes Date of Specialist follow-up appointment?: 11/09/23 Follow-Up Specialty Provider:: Pulmonology Do you need transportation to your follow-up appointment?: No Do you understand care options if your condition(s) worsen?: Yes-patient verbalized understanding  SDOH Interventions Today    Flowsheet Row Most Recent Value  SDOH Interventions   Food Insecurity Interventions Intervention Not Indicated  Housing Interventions Intervention Not Indicated  Transportation Interventions Intervention Not Indicated  Utilities Interventions Intervention Not Indicated       Arna Better RN, BSN [place]  [facility] RN Care Manager [phone]

## 2023-09-19 ENCOUNTER — Telehealth: Payer: Self-pay

## 2023-09-19 ENCOUNTER — Inpatient Hospital Stay: Admitting: Pulmonary Disease

## 2023-09-19 ENCOUNTER — Other Ambulatory Visit: Payer: Self-pay

## 2023-09-19 DIAGNOSIS — U071 COVID-19: Secondary | ICD-10-CM | POA: Diagnosis not present

## 2023-09-19 DIAGNOSIS — J449 Chronic obstructive pulmonary disease, unspecified: Secondary | ICD-10-CM | POA: Diagnosis not present

## 2023-09-20 ENCOUNTER — Telehealth (INDEPENDENT_AMBULATORY_CARE_PROVIDER_SITE_OTHER): Payer: Self-pay | Admitting: Primary Care

## 2023-09-20 DIAGNOSIS — Z6829 Body mass index (BMI) 29.0-29.9, adult: Secondary | ICD-10-CM | POA: Diagnosis not present

## 2023-09-20 DIAGNOSIS — F17201 Nicotine dependence, unspecified, in remission: Secondary | ICD-10-CM | POA: Diagnosis not present

## 2023-09-20 DIAGNOSIS — Z792 Long term (current) use of antibiotics: Secondary | ICD-10-CM | POA: Diagnosis not present

## 2023-09-20 DIAGNOSIS — G5603 Carpal tunnel syndrome, bilateral upper limbs: Secondary | ICD-10-CM | POA: Diagnosis not present

## 2023-09-20 DIAGNOSIS — M109 Gout, unspecified: Secondary | ICD-10-CM | POA: Diagnosis not present

## 2023-09-20 DIAGNOSIS — Z7951 Long term (current) use of inhaled steroids: Secondary | ICD-10-CM | POA: Diagnosis not present

## 2023-09-20 DIAGNOSIS — Z7952 Long term (current) use of systemic steroids: Secondary | ICD-10-CM | POA: Diagnosis not present

## 2023-09-20 DIAGNOSIS — J9621 Acute and chronic respiratory failure with hypoxia: Secondary | ICD-10-CM | POA: Diagnosis not present

## 2023-09-20 DIAGNOSIS — Z8616 Personal history of COVID-19: Secondary | ICD-10-CM | POA: Diagnosis not present

## 2023-09-20 DIAGNOSIS — N182 Chronic kidney disease, stage 2 (mild): Secondary | ICD-10-CM | POA: Diagnosis not present

## 2023-09-20 DIAGNOSIS — J440 Chronic obstructive pulmonary disease with acute lower respiratory infection: Secondary | ICD-10-CM | POA: Diagnosis not present

## 2023-09-20 DIAGNOSIS — E785 Hyperlipidemia, unspecified: Secondary | ICD-10-CM | POA: Diagnosis not present

## 2023-09-20 DIAGNOSIS — J449 Chronic obstructive pulmonary disease, unspecified: Secondary | ICD-10-CM | POA: Diagnosis not present

## 2023-09-20 DIAGNOSIS — Z556 Problems related to health literacy: Secondary | ICD-10-CM | POA: Diagnosis not present

## 2023-09-20 DIAGNOSIS — D696 Thrombocytopenia, unspecified: Secondary | ICD-10-CM | POA: Diagnosis not present

## 2023-09-20 DIAGNOSIS — E66812 Obesity, class 2: Secondary | ICD-10-CM | POA: Diagnosis not present

## 2023-09-20 DIAGNOSIS — R911 Solitary pulmonary nodule: Secondary | ICD-10-CM | POA: Diagnosis not present

## 2023-09-20 DIAGNOSIS — J189 Pneumonia, unspecified organism: Secondary | ICD-10-CM | POA: Diagnosis not present

## 2023-09-20 DIAGNOSIS — E46 Unspecified protein-calorie malnutrition: Secondary | ICD-10-CM | POA: Diagnosis not present

## 2023-09-20 DIAGNOSIS — Z9981 Dependence on supplemental oxygen: Secondary | ICD-10-CM | POA: Diagnosis not present

## 2023-09-20 DIAGNOSIS — J441 Chronic obstructive pulmonary disease with (acute) exacerbation: Secondary | ICD-10-CM | POA: Diagnosis not present

## 2023-09-20 DIAGNOSIS — I129 Hypertensive chronic kidney disease with stage 1 through stage 4 chronic kidney disease, or unspecified chronic kidney disease: Secondary | ICD-10-CM | POA: Diagnosis not present

## 2023-09-20 DIAGNOSIS — I272 Pulmonary hypertension, unspecified: Secondary | ICD-10-CM | POA: Diagnosis not present

## 2023-09-20 NOTE — Telephone Encounter (Signed)
 Called pt to confirm appt. Pt will call back to see if he will be able to make it.

## 2023-09-21 ENCOUNTER — Inpatient Hospital Stay (INDEPENDENT_AMBULATORY_CARE_PROVIDER_SITE_OTHER): Admitting: Primary Care

## 2023-09-21 ENCOUNTER — Other Ambulatory Visit: Payer: Self-pay

## 2023-09-25 ENCOUNTER — Other Ambulatory Visit: Payer: Self-pay

## 2023-09-26 DIAGNOSIS — F17201 Nicotine dependence, unspecified, in remission: Secondary | ICD-10-CM | POA: Diagnosis not present

## 2023-09-26 DIAGNOSIS — J189 Pneumonia, unspecified organism: Secondary | ICD-10-CM | POA: Diagnosis not present

## 2023-09-26 DIAGNOSIS — Z6829 Body mass index (BMI) 29.0-29.9, adult: Secondary | ICD-10-CM | POA: Diagnosis not present

## 2023-09-26 DIAGNOSIS — E46 Unspecified protein-calorie malnutrition: Secondary | ICD-10-CM | POA: Diagnosis not present

## 2023-09-26 DIAGNOSIS — Z556 Problems related to health literacy: Secondary | ICD-10-CM | POA: Diagnosis not present

## 2023-09-26 DIAGNOSIS — E66812 Obesity, class 2: Secondary | ICD-10-CM | POA: Diagnosis not present

## 2023-09-26 DIAGNOSIS — J9621 Acute and chronic respiratory failure with hypoxia: Secondary | ICD-10-CM | POA: Diagnosis not present

## 2023-09-26 DIAGNOSIS — I272 Pulmonary hypertension, unspecified: Secondary | ICD-10-CM | POA: Diagnosis not present

## 2023-09-26 DIAGNOSIS — M109 Gout, unspecified: Secondary | ICD-10-CM | POA: Diagnosis not present

## 2023-09-26 DIAGNOSIS — Z9981 Dependence on supplemental oxygen: Secondary | ICD-10-CM | POA: Diagnosis not present

## 2023-09-26 DIAGNOSIS — Z8616 Personal history of COVID-19: Secondary | ICD-10-CM | POA: Diagnosis not present

## 2023-09-26 DIAGNOSIS — Z792 Long term (current) use of antibiotics: Secondary | ICD-10-CM | POA: Diagnosis not present

## 2023-09-26 DIAGNOSIS — Z7952 Long term (current) use of systemic steroids: Secondary | ICD-10-CM | POA: Diagnosis not present

## 2023-09-26 DIAGNOSIS — N182 Chronic kidney disease, stage 2 (mild): Secondary | ICD-10-CM | POA: Diagnosis not present

## 2023-09-26 DIAGNOSIS — J441 Chronic obstructive pulmonary disease with (acute) exacerbation: Secondary | ICD-10-CM | POA: Diagnosis not present

## 2023-09-26 DIAGNOSIS — Z7951 Long term (current) use of inhaled steroids: Secondary | ICD-10-CM | POA: Diagnosis not present

## 2023-09-26 DIAGNOSIS — J440 Chronic obstructive pulmonary disease with acute lower respiratory infection: Secondary | ICD-10-CM | POA: Diagnosis not present

## 2023-09-26 DIAGNOSIS — R911 Solitary pulmonary nodule: Secondary | ICD-10-CM | POA: Diagnosis not present

## 2023-09-26 DIAGNOSIS — G5603 Carpal tunnel syndrome, bilateral upper limbs: Secondary | ICD-10-CM | POA: Diagnosis not present

## 2023-09-26 DIAGNOSIS — D696 Thrombocytopenia, unspecified: Secondary | ICD-10-CM | POA: Diagnosis not present

## 2023-09-26 DIAGNOSIS — I129 Hypertensive chronic kidney disease with stage 1 through stage 4 chronic kidney disease, or unspecified chronic kidney disease: Secondary | ICD-10-CM | POA: Diagnosis not present

## 2023-09-26 DIAGNOSIS — E785 Hyperlipidemia, unspecified: Secondary | ICD-10-CM | POA: Diagnosis not present

## 2023-10-02 DIAGNOSIS — U071 COVID-19: Secondary | ICD-10-CM | POA: Diagnosis not present

## 2023-10-02 DIAGNOSIS — J449 Chronic obstructive pulmonary disease, unspecified: Secondary | ICD-10-CM | POA: Diagnosis not present

## 2023-10-03 ENCOUNTER — Telehealth: Payer: Self-pay

## 2023-10-03 NOTE — Telephone Encounter (Signed)
 Copied from CRM [id number]. Topic: Clinical - Order For Equipment >> Oct 01, 2023  9:28 AM Isabell A wrote: Reason for CRM: Patient states he needs a high water bottle for his concentrator. Requesting a fax to [facility] with patient regarding prior message . Patient stated he has received a water bottle for his concentrator.   Patient's voice was understanding.Nothing else further needed.

## 2023-10-04 ENCOUNTER — Other Ambulatory Visit: Payer: Self-pay | Admitting: Family Medicine

## 2023-10-04 ENCOUNTER — Other Ambulatory Visit: Payer: Self-pay

## 2023-10-04 MED ORDER — MISC. DEVICES MISC
0 refills | Status: DC
Start: 2023-10-04 — End: 2023-10-08

## 2023-10-04 NOTE — Patient Instructions (Signed)
 Visit Information  [name] [name] was given information about Medicaid Managed Care team care coordination services as a part of their Healthy [facility] Medicaid benefit. [name] [name]. verbally consented to engagement with the [facility] Managed Care team.   If you are experiencing a medical emergency, please call 911 or report to your local emergency department or urgent care.   If you have a non-emergency medical problem during routine business hours, please contact your provider's office and ask to speak with a nurse.   For questions related to your Healthy [facility] health plan, please call: [phone] or visit the homepage here: [email or web address]  If you would like to schedule transportation through your Healthy [facility] plan, please call the following number at least 2 days in advance of your appointment: [phone]  For information about your ride after you set it up, call Ride Assist at [phone]. Use this number to activate a Will Call pickup, or if your transportation is late for a scheduled pickup. Use this number, too, if you need to make a change or cancel a previously scheduled reservation.  If you need transportation services right away, call [phone]. The after-hours call center is staffed 24 hours to handle ride assistance and urgent reservation requests (including discharges) 365 days a year. Urgent trips include sick visits, hospital discharge requests and life-sustaining treatment.  Call the [facility] Line at [phone], at any time, 24 hours a day, 7 days a week. If you are in danger or need immediate medical attention call 911.  If you would like help to quit smoking, call 1-800-QUIT-NOW ((559)268-1413) OR Espaol: 1-855-Djelo-Ya (8-144-664-6430) o para ms informacin haga clic aqu or Text READY to 799-599 to register via text  [name] [name] - following are the goals we discussed in your visit today:   Goals  Addressed             This Visit's Progress    VBCI RN Care Plan   On track    Problems:  Chronic Disease Management support and education needs related to COPD  Goal: Over the next 30 days the Patient will attend all scheduled medical appointments: PCP 10/10/23 as evidenced by completed visit notes uploaded to EMR        demonstrate a decrease COPD in exacerbations as evidenced by patient report of stable breathing, no worsening symptoms, no increase oxygen  need not experience hospital admission as evidenced by review of electronic medical record. Hospital Admissions in last 6 months = 3 take all medications exactly as prescribed and will call provider for medication related questions as evidenced by patient report of medication compliance     Interventions:   COPD Interventions: Advised patient to track and manage COPD triggers Assessed social determinant of health barriers Provided education about and advised patient to utilize infection prevention strategies to reduce risk of respiratory infection Provided instruction about proper use of medications used for management of COPD including inhalers Screening for signs and symptoms of depression related to chronic disease state  Use of home oxygen  Discussed the importance of staying up to date on vaccinations. Patient declines education related to vaccination   Evaluation of current treatment plan related to COPD, Transportation self-management and patient's adherence to plan as established by provider. Discussed plans with patient for ongoing care management follow up and provided patient with direct contact information for care management team Evaluation of current treatment plan related to COPD and patient's adherence to plan as established by provider Reviewed scheduled/upcoming provider appointments  including PCP 10/10/23 and pulmonology 11/09/23 Discussed plans with patient for ongoing care management follow up and provided patient  with direct contact information for care management team Discussed identified transportation barrier. Patient does not wish to use public transportation due to previously getting COVID while using. He reports he has information on this resource. No care guide/BSW referral indicated at this time Message sent to PCP asking for an order for BP cuff  Patient Self-Care Activities:  Attend all scheduled provider appointments Call provider office for new concerns or questions  Perform all self care activities independently  Take medications as prescribed   do breathing exercises every day eliminate symptom triggers at home keep follow-up appointments: PCP and pulmonology  Plan:  Telephone follow up appointment with care management team member scheduled for:  10/18/23 at 10:30 AM             Please see education materials related to COPD action plan provided by MyChart link.  Patient verbalizes understanding of instructions and care plan provided today and agrees to view in MyChart. Active MyChart status and patient understanding of how to access instructions and care plan via MyChart confirmed with patient.     RN Care Manager will message PCP regarding an order for a BP cuff Telephone follow up appointment with Managed Medicaid care management team member scheduled for: 10/18/23 at 10:30 AM  [name], RN MSN [place]  [facility] Health RN Care Manager Direct Dial: [phone]  Fax: [phone]   Following is a copy of your plan of care:  There are no care plans that you recently modified to display for this patient.  COPD Action Plan A COPD action plan is a description of what to do when you have a flare (exacerbation) of chronic obstructive pulmonary disease (COPD). Your action plan is a color-coded plan that lists the symptoms that indicate whether your condition is under control and what actions to take. If you have symptoms in the green zone, it means you  are doing well that day. If you have symptoms in the yellow zone, it means you are having a bad day or an exacerbation. If you have symptoms in the red zone, you need urgent medical care. Follow the plan that you and your health care provider developed. Review your plan with your health care provider at each visit. Red zone Symptoms in this zone mean that you should get medical help right away. They include: Feeling very short of breath, even when you are resting. Not being able to do any activities because of poor breathing. Not being able to sleep because of poor breathing. Fever or shaking chills. Feeling confused or very sleepy. Chest pain. Coughing up blood. If you have any of these symptoms, call emergency services (911 in the [place] or go to the nearest emergency room. Yellow zone Symptoms in this zone mean that your condition may be getting worse. They include: Feeling more short of breath than usual. Having less energy for daily activities than usual. Phlegm or mucus that is thicker than usual. Needing to use your rescue inhaler or nebulizer more often than usual. More ankle swelling than usual. Coughing more than usual. Feeling like you have a chest cold. Trouble sleeping due to COPD symptoms. Decreased appetite. COPD medicines not helping as much as usual. If you experience any yellow symptoms: Keep taking your daily medicines as directed. Use your quick-relief inhaler as told by your health care provider. If you were prescribed steroid medicine to  take by mouth (oral medicine), start taking it as told by your health care provider. If you were prescribed an antibiotic medicine, start taking it as told by your health care provider. Do not stop taking the antibiotic even if you start to feel better. Use oxygen  as told by your health care provider. Get more rest. Do your pursed-lip breathing exercises. Do not smoke. Avoid any irritants in the air. If your signs and  symptoms do not improve after taking these steps, call your health care provider right away. Green zone Symptoms in this zone mean that you are doing well. They include: Being able to do your usual activities and exercise. Having the usual amount of coughing, including the same amount of phlegm or mucus. Being able to sleep well. Having a good appetite. Where to find more information: You can find more information about COPD from: American Lung Association, My COPD Action Plan: www.lung.org COPD Foundation: www.copdfoundation.org National Heart, Lung, & Blood Institute: PopSteam.is Follow these instructions at home: Continue taking your daily medicines as told by your health care provider. Make sure you receive all the immunizations that your health care provider recommends, especially the pneumococcal and influenza vaccines. Wash your hands often with soap and water. Have family members wash their hands too. Regular hand washing can help prevent infections. Follow your usual exercise and diet plan. Avoid irritants in the air, such as smoke. Do not use any products that contain nicotine  or tobacco. These products include cigarettes, chewing tobacco, and vaping devices, such as e-cigarettes. If you need help quitting, ask your health care provider. Summary A COPD action plan tells you what to do when you have a flare (exacerbation) of chronic obstructive pulmonary disease (COPD). Follow each action plan for your symptoms. If you have any symptoms in the red zone, call emergency services (911 in the [place] or go to the nearest emergency room. This information is not intended to replace advice given to you by your health care provider. Make sure you discuss any questions you have with your health care provider. Document Revised: 02/08/2023 Document Reviewed: 02/08/2023 Elsevier Patient Education  2024 [employer].

## 2023-10-04 NOTE — Patient Outreach (Signed)
 Complex Care Management   Visit Note  10/04/2023  Name:  [name] [name]. MRN: [id number] DOB: 08-30-1960  Situation: Referral received for Complex Care Management related to COPD I obtained verbal consent from Patient.  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   COPD (chronic obstructive pulmonary disease) (HCC)    on 4L home O2   Essential hypertension    Gout    Thrombocytopenia (HCC) 07/28/2012    Assessment: Patient Reported Symptoms:  Cognitive Cognitive Status: Able to follow simple commands, Alert and oriented to person, place, and time, Normal speech and language skills Cognitive/Intellectual Conditions Management [RPT]: None reported or documented in medical history or problem list      Neurological Neurological Review of Symptoms: No symptoms reported    HEENT HEENT Symptoms Reported: No symptoms reported HEENT Conditions: Tooth problem(s) Tooth Problems: missing (Patient reports he does not have teeth, does not have dentures) Tooth problem(s)  Cardiovascular Cardiovascular Symptoms Reported: No symptoms reported Does patient have uncontrolled Hypertension?: No Is patient checking Blood Pressure at home?: No Patient's Recent BP reading at home: [facility] RN and PT checking BP. Patient does not remember recent reading but reports he is always told it is good Cardiovascular Conditions: Hypertension, High blood cholesterol Cardiovascular Management Strategies: Medication therapy Cardiovascular Comment: Patient reports he does not have a BP cuff. Message sent to PCP asking for an order.  Respiratory Respiratory Symptoms Reported: No symptoms reported Additional Respiratory Details: Oxygen  at 10 L at rest and up to 15 L with activity. He does not remember company that he gets oxygen  supplies. Patient reports he feels that his breathing is doing  really well today. His oxygen  saturation is 95%. Respiratory Conditions: COPD  Endocrine Patient reports the following symptoms related to hypoglycemia or hyperglycemia : No symptoms reported Is patient diabetic?: No    Gastrointestinal Gastrointestinal Symptoms Reported: Other Other Gastrointestinal Symptoms: Last BM this morning. Patient reports his stool was loose so he took an antidiarrheal medication, which typically works well for him. Additional Gastrointestinal Details: Patient reports a good appetite. Gastrointestinal Management Strategies: Medication therapy Nutrition Risk Screen (CP): No indicators present  Genitourinary Genitourinary Symptoms Reported: No symptoms reported Genitourinary Conditions: Chronic kidney disease  Integumentary Integumentary Symptoms Reported: No symptoms reported    Musculoskeletal Musculoskelatal Symptoms Reviewed: Unsteady gait Musculoskeletal Conditions: Unsteady gait Musculoskeletal Management Strategies: Medical device Falls in the past year?: No Number of falls in past year: 1 or less Was there an injury with Fall?: No Fall Risk Category Calculator: 0 Patient Fall Risk Level: Low Fall Risk Patient at Risk for Falls Due to: Impaired balance/gait Fall risk Follow up: Falls evaluation completed, Education provided, Falls prevention discussed  Psychosocial Psychosocial Symptoms Reported: No symptoms reported     Quality of Family Relationships: helpful, involved, supportive Do you feel physically threatened by others?: No      10/04/2023   10:21 AM  Depression screen PHQ 2/9  Decreased Interest 0  Down, Depressed, Hopeless 0  PHQ - 2 Score 0    There were no vitals filed for this visit.  Medications Reviewed Today     Reviewed by Arno Rosaline SQUIBB, RN (Registered Nurse) on 10/04/23 at 1011  Med List Status: <None>   Medication Order Taking? Sig Documenting Provider Last Dose Status Informant  albuterol  (PROVENTIL ) (2.5 MG/3ML)  0.083% nebulizer solution  [id number] Yes TAKE 3 MLS (2.5 MG TOTAL) BY NEBULIZATION EVERY 6 (SIX) HOURS AS NEEDED FOR SHORTNESS OF BREATH. Danton Jon HERO, PA-C  Active Self, Pharmacy Records  albuterol  (VENTOLIN  HFA) [phone] Base) MCG/ACT inhaler [id number] Yes Inhale 2 puffs into the lungs every 6 (six) hours as needed for wheezing or shortness of breath Danton Jon HERO, PA-C  Active Self, Pharmacy Records    Discontinued 07/03/11 1455 (Entry Error)   allopurinol  (ZYLOPRIM ) 100 MG tablet [id number]  Take 1 tablet (100 mg total) by mouth daily. Danton Jon HERO, PA-C  Active Self, Pharmacy Records  amLODipine  (NORVASC ) 10 MG tablet [id number]  Take 1 tablet (10 mg total) by mouth daily. Danton Jon HERO, PA-C  Active Self, Pharmacy Records  Ascorbic Acid  (VITAMIN C  PO) [id number]  Take 1 tablet by mouth daily. [provider]  Active Self, Pharmacy Records  budesonide -formoterol  (SYMBICORT ) 160-4.5 MCG/ACT inhaler [id number]  Inhale 2 puffs into the lungs 2 (two) times daily. Mannam, Praveen, MD  Active Self, Pharmacy Records  Cyanocobalamin  (VITAMIN B-12 PO) [id number]  Take 1 tablet by mouth daily. [provider]  Active Self, Pharmacy Records  dicyclomine  (BENTYL ) 20 MG tablet [id number]  Take 1 tablet (20 mg total) by mouth 3 (three) times daily as needed for spasms. Darra Fonda MATSU, MD  Active Self, Pharmacy Records  ezetimibe  (ZETIA ) 10 MG tablet [id number]  Take 1 tablet (10 mg total) by mouth daily. Danton Jon HERO, PA-C  Active Self, Pharmacy Records  ibuprofen  (ADVIL ) 200 MG tablet [id number]  Take 800 mg by mouth 2 (two) times daily as needed for moderate pain (pain score 4-6) or headache. [provider]  Active Self, Pharmacy Records  metoprolol  succinate (TOPROL -XL) 50 MG 24 hr tablet [id number]  Take 1 tablet (50 mg total) by mouth daily. Newlin, Enobong, MD  Active Self, Pharmacy Records           Med Note (ROBB, MELANIE A   Fri Sep 07, 2023 12:00 PM)     montelukast  (SINGULAIR ) 10 MG tablet [id number]  Take 1 tablet (10 mg total) by mouth at bedtime. Leotis Bogus, MD  Active   roflumilast  (DALIRESP ) 500 MCG TABS tablet [id number]  Take 0.5 tablets (250 mcg total) by mouth daily for 30 days, THEN 1 tablet (500 mcg total) daily. Leotis Bogus, MD  Active   tiotropium (SPIRIVA  HANDIHALER) 18 MCG inhalation capsule [id number]  PLACE 1 CAPSULE (18 MCG TOTAL) INTO INHALER AND INHALE DAILY. Newlin, Enobong, MD  Active   Vitamin D , Ergocalciferol , (DRISDOL ) 1.25 MG (50000 UNIT) CAPS capsule [id number]  Take 1 capsule (50,000 Units total) by mouth every 7 (seven) days. Leotis Bogus, MD  Active             Recommendation:   PCP Follow-up Specialty provider follow-up pulmonology DME requests:  other BP cuff Continue Current Plan of Care  Follow Up Plan:   Telephone follow up appointment date/time:  10/18/23 at 10:30 AM  [name], RN MSN [place]  [facility] RN Care Manager Direct Dial: [phone]  Fax: [phone]

## 2023-10-05 DIAGNOSIS — M109 Gout, unspecified: Secondary | ICD-10-CM | POA: Diagnosis not present

## 2023-10-05 DIAGNOSIS — J9621 Acute and chronic respiratory failure with hypoxia: Secondary | ICD-10-CM | POA: Diagnosis not present

## 2023-10-05 DIAGNOSIS — I129 Hypertensive chronic kidney disease with stage 1 through stage 4 chronic kidney disease, or unspecified chronic kidney disease: Secondary | ICD-10-CM | POA: Diagnosis not present

## 2023-10-05 DIAGNOSIS — J440 Chronic obstructive pulmonary disease with acute lower respiratory infection: Secondary | ICD-10-CM | POA: Diagnosis not present

## 2023-10-05 DIAGNOSIS — Z7951 Long term (current) use of inhaled steroids: Secondary | ICD-10-CM | POA: Diagnosis not present

## 2023-10-05 DIAGNOSIS — E785 Hyperlipidemia, unspecified: Secondary | ICD-10-CM | POA: Diagnosis not present

## 2023-10-05 DIAGNOSIS — E46 Unspecified protein-calorie malnutrition: Secondary | ICD-10-CM | POA: Diagnosis not present

## 2023-10-05 DIAGNOSIS — Z556 Problems related to health literacy: Secondary | ICD-10-CM | POA: Diagnosis not present

## 2023-10-05 DIAGNOSIS — G5603 Carpal tunnel syndrome, bilateral upper limbs: Secondary | ICD-10-CM | POA: Diagnosis not present

## 2023-10-05 DIAGNOSIS — Z7952 Long term (current) use of systemic steroids: Secondary | ICD-10-CM | POA: Diagnosis not present

## 2023-10-05 DIAGNOSIS — D696 Thrombocytopenia, unspecified: Secondary | ICD-10-CM | POA: Diagnosis not present

## 2023-10-05 DIAGNOSIS — J189 Pneumonia, unspecified organism: Secondary | ICD-10-CM | POA: Diagnosis not present

## 2023-10-05 DIAGNOSIS — F17201 Nicotine dependence, unspecified, in remission: Secondary | ICD-10-CM | POA: Diagnosis not present

## 2023-10-05 DIAGNOSIS — N182 Chronic kidney disease, stage 2 (mild): Secondary | ICD-10-CM | POA: Diagnosis not present

## 2023-10-05 DIAGNOSIS — Z792 Long term (current) use of antibiotics: Secondary | ICD-10-CM | POA: Diagnosis not present

## 2023-10-05 DIAGNOSIS — Z8616 Personal history of COVID-19: Secondary | ICD-10-CM | POA: Diagnosis not present

## 2023-10-05 DIAGNOSIS — J441 Chronic obstructive pulmonary disease with (acute) exacerbation: Secondary | ICD-10-CM | POA: Diagnosis not present

## 2023-10-05 DIAGNOSIS — I272 Pulmonary hypertension, unspecified: Secondary | ICD-10-CM | POA: Diagnosis not present

## 2023-10-05 DIAGNOSIS — E66812 Obesity, class 2: Secondary | ICD-10-CM | POA: Diagnosis not present

## 2023-10-05 DIAGNOSIS — R911 Solitary pulmonary nodule: Secondary | ICD-10-CM | POA: Diagnosis not present

## 2023-10-05 DIAGNOSIS — Z9981 Dependence on supplemental oxygen: Secondary | ICD-10-CM | POA: Diagnosis not present

## 2023-10-05 DIAGNOSIS — Z6829 Body mass index (BMI) 29.0-29.9, adult: Secondary | ICD-10-CM | POA: Diagnosis not present

## 2023-10-08 ENCOUNTER — Other Ambulatory Visit: Payer: Self-pay

## 2023-10-08 DIAGNOSIS — J9621 Acute and chronic respiratory failure with hypoxia: Secondary | ICD-10-CM | POA: Diagnosis not present

## 2023-10-08 DIAGNOSIS — Z7952 Long term (current) use of systemic steroids: Secondary | ICD-10-CM | POA: Diagnosis not present

## 2023-10-08 DIAGNOSIS — J441 Chronic obstructive pulmonary disease with (acute) exacerbation: Secondary | ICD-10-CM | POA: Diagnosis not present

## 2023-10-08 DIAGNOSIS — I272 Pulmonary hypertension, unspecified: Secondary | ICD-10-CM | POA: Diagnosis not present

## 2023-10-08 DIAGNOSIS — E66812 Obesity, class 2: Secondary | ICD-10-CM | POA: Diagnosis not present

## 2023-10-08 DIAGNOSIS — F17201 Nicotine dependence, unspecified, in remission: Secondary | ICD-10-CM | POA: Diagnosis not present

## 2023-10-08 DIAGNOSIS — Z8616 Personal history of COVID-19: Secondary | ICD-10-CM | POA: Diagnosis not present

## 2023-10-08 DIAGNOSIS — Z9981 Dependence on supplemental oxygen: Secondary | ICD-10-CM | POA: Diagnosis not present

## 2023-10-08 DIAGNOSIS — Z7951 Long term (current) use of inhaled steroids: Secondary | ICD-10-CM | POA: Diagnosis not present

## 2023-10-08 DIAGNOSIS — Z556 Problems related to health literacy: Secondary | ICD-10-CM | POA: Diagnosis not present

## 2023-10-08 DIAGNOSIS — E46 Unspecified protein-calorie malnutrition: Secondary | ICD-10-CM | POA: Diagnosis not present

## 2023-10-08 DIAGNOSIS — J189 Pneumonia, unspecified organism: Secondary | ICD-10-CM | POA: Diagnosis not present

## 2023-10-08 DIAGNOSIS — D696 Thrombocytopenia, unspecified: Secondary | ICD-10-CM | POA: Diagnosis not present

## 2023-10-08 DIAGNOSIS — I129 Hypertensive chronic kidney disease with stage 1 through stage 4 chronic kidney disease, or unspecified chronic kidney disease: Secondary | ICD-10-CM | POA: Diagnosis not present

## 2023-10-08 DIAGNOSIS — R911 Solitary pulmonary nodule: Secondary | ICD-10-CM | POA: Diagnosis not present

## 2023-10-08 DIAGNOSIS — E785 Hyperlipidemia, unspecified: Secondary | ICD-10-CM | POA: Diagnosis not present

## 2023-10-08 DIAGNOSIS — N182 Chronic kidney disease, stage 2 (mild): Secondary | ICD-10-CM | POA: Diagnosis not present

## 2023-10-08 DIAGNOSIS — Z792 Long term (current) use of antibiotics: Secondary | ICD-10-CM | POA: Diagnosis not present

## 2023-10-08 DIAGNOSIS — Z6829 Body mass index (BMI) 29.0-29.9, adult: Secondary | ICD-10-CM | POA: Diagnosis not present

## 2023-10-08 DIAGNOSIS — M109 Gout, unspecified: Secondary | ICD-10-CM | POA: Diagnosis not present

## 2023-10-08 DIAGNOSIS — J440 Chronic obstructive pulmonary disease with acute lower respiratory infection: Secondary | ICD-10-CM | POA: Diagnosis not present

## 2023-10-08 DIAGNOSIS — G5603 Carpal tunnel syndrome, bilateral upper limbs: Secondary | ICD-10-CM | POA: Diagnosis not present

## 2023-10-08 MED ORDER — MISC. DEVICES MISC: 0 refills | Status: DC

## 2023-10-09 ENCOUNTER — Other Ambulatory Visit: Payer: Self-pay

## 2023-10-09 ENCOUNTER — Other Ambulatory Visit: Payer: Self-pay | Admitting: Family Medicine

## 2023-10-09 DIAGNOSIS — I10 Essential (primary) hypertension: Secondary | ICD-10-CM | POA: Diagnosis not present

## 2023-10-10 ENCOUNTER — Ambulatory Visit: Attending: Family Medicine | Admitting: Family Medicine

## 2023-10-10 ENCOUNTER — Encounter: Payer: Self-pay | Admitting: Family Medicine

## 2023-10-10 ENCOUNTER — Other Ambulatory Visit: Payer: Self-pay

## 2023-10-10 ENCOUNTER — Telehealth: Payer: Self-pay | Admitting: Family Medicine

## 2023-10-10 ENCOUNTER — Telehealth: Payer: Self-pay

## 2023-10-10 DIAGNOSIS — E785 Hyperlipidemia, unspecified: Secondary | ICD-10-CM

## 2023-10-10 DIAGNOSIS — Z1211 Encounter for screening for malignant neoplasm of colon: Secondary | ICD-10-CM

## 2023-10-10 DIAGNOSIS — I10 Essential (primary) hypertension: Secondary | ICD-10-CM

## 2023-10-10 DIAGNOSIS — Z9981 Dependence on supplemental oxygen: Secondary | ICD-10-CM | POA: Diagnosis not present

## 2023-10-10 DIAGNOSIS — J9611 Chronic respiratory failure with hypoxia: Secondary | ICD-10-CM | POA: Diagnosis not present

## 2023-10-10 DIAGNOSIS — M1A9XX0 Chronic gout, unspecified, without tophus (tophi): Secondary | ICD-10-CM | POA: Diagnosis not present

## 2023-10-10 DIAGNOSIS — J449 Chronic obstructive pulmonary disease, unspecified: Secondary | ICD-10-CM

## 2023-10-10 DIAGNOSIS — E7800 Pure hypercholesterolemia, unspecified: Secondary | ICD-10-CM

## 2023-10-10 MED ORDER — AMLODIPINE BESYLATE 10 MG PO TABS
10.0000 mg | ORAL_TABLET | Freq: Every day | ORAL | 1 refills | Status: AC
  Filled 2023-10-10 – 2023-11-26 (×2): qty 90, 90d supply, fill #0
  Filled 2024-02-18: qty 90, 90d supply, fill #1

## 2023-10-10 MED ORDER — METOPROLOL SUCCINATE ER 50 MG PO TB24
50.0000 mg | ORAL_TABLET | Freq: Every day | ORAL | 1 refills | Status: DC
  Filled 2023-10-10 – 2023-10-25 (×2): qty 90, 90d supply, fill #0

## 2023-10-10 MED ORDER — ALLOPURINOL 100 MG PO TABS
100.0000 mg | ORAL_TABLET | Freq: Every day | ORAL | 1 refills | Status: AC
  Filled 2023-10-10 – 2023-11-26 (×2): qty 90, 90d supply, fill #0
  Filled 2024-02-18: qty 90, 90d supply, fill #1

## 2023-10-10 NOTE — Telephone Encounter (Signed)
 Copied from CRM [id number]. Topic: Appointments - Appointment Scheduling >> Oct 10, 2023  9:43 AM [place] B wrote:  Pt called in needs phone call for appt today instead of video, pt can't get into mychart

## 2023-10-10 NOTE — Progress Notes (Signed)
 Virtual Visit via Video Note  I connected with [name] Nechama Raddle., on 10/10/2023 at 1:32 PM by video enabled telemedicine device and verified that I am speaking with the correct person using two identifiers.   Clinician has audio - video capabilities but patient was only able to operate/preferred audio.  Consent: I discussed the limitations, risks, security and privacy concerns of performing an evaluation and management service by telemedicine and the availability of in person appointments. I also discussed with the patient that there may be a patient responsible charge related to this service. The patient expressed understanding and agreed to proceed.   Location of Patient: Home  Location of Provider: Clinic   Persons participating in Telemedicine visit: [name] Nechama Raddle. Dr. Delbert    Discussed the use of AI scribe software for clinical note transcription with the patient, who gave verbal consent to proceed.  History of Present Illness [name] [name]. 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  who presents for follow-up after hospitalization due to oxygen  machine malfunction.  In May, his oxygen  machine malfunctioned, leading to inadequate oxygen  delivery and subsequent hospitalization.  He was admitted for COPD exacerbation and treated with antibiotics and prednisone .  After discharge, a technician replaced a faulty component, improving his breathing. He is currently on 10 liters of oxygen . He continues to use Symbicort , albuterol , and roflumilast , with a pending refill for roflumilast .  He takes amlodipine  and metoprolol  for hypertension but lacks a means to check his blood pressure at home.  Gout is stable with allopurinol . He takes Zetia  for hyperlipidemia due to an allergy to Lipitor. He is due for a colon cancer screening and prefers a stool test over a colonoscopy.      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   COPD (chronic obstructive pulmonary disease) (HCC)    on 4L home O2   Essential hypertension    Gout    Thrombocytopenia (HCC) 07/28/2012   Allergies  Allergen Reactions   Lipitor [Atorvastatin ] Itching and Swelling    Facial, tongue swelling   Shellfish Allergy Anaphylaxis   Beef (Diagnostic) Other (See Comments)    Gout flares   Colchicine  Hives    Current Outpatient Medications on File Prior to Visit  Medication Sig Dispense Refill   albuterol  (PROVENTIL ) (2.5 MG/3ML) 0.083% nebulizer solution TAKE 3 MLS (2.5 MG TOTAL) BY NEBULIZATION EVERY 6 (SIX) HOURS AS NEEDED FOR SHORTNESS OF BREATH. 360 mL 0   albuterol  (VENTOLIN  HFA) 108 (90 Base) MCG/ACT inhaler Inhale 2 puffs into the lungs every 6 (six) hours as needed for wheezing or shortness of breath 18 g 2   allopurinol  (ZYLOPRIM ) 100 MG tablet Take 1 tablet (100 mg total) by mouth daily. 90 tablet 0   amLODipine  (NORVASC ) 10 MG tablet Take 1 tablet (10 mg total) by mouth daily. 90 tablet 0   Ascorbic Acid  (VITAMIN C  PO) Take 1 tablet by mouth daily.     budesonide -formoterol  (SYMBICORT ) 160-4.5 MCG/ACT inhaler Inhale 2 puffs into the lungs 2 (two) times daily. 10.2 g 5   Cyanocobalamin  (VITAMIN B-12 PO) Take 1 tablet by mouth daily.     dicyclomine  (BENTYL ) 20 MG tablet Take 1 tablet (20 mg total) by mouth 3 (three) times daily as needed for spasms. (Patient not taking: Reported on 10/04/2023) 20 tablet 0  ezetimibe  (ZETIA ) 10 MG tablet Take 1 tablet (10 mg total) by mouth daily. 90 tablet 0   ibuprofen  (ADVIL ) 200 MG tablet Take 800 mg by mouth 2 (two) times daily as needed for moderate pain (pain score 4-6) or headache.     metoprolol  succinate (TOPROL -XL) 50 MG 24 hr tablet Take 1 tablet (50 mg total) by mouth daily. 90 tablet 0   Misc. Devices MISC BP monitor.  Diagnosis-hypertension 1 each 0   montelukast  (SINGULAIR ) 10 MG tablet Take 1  tablet (10 mg total) by mouth at bedtime. 30 tablet 0   roflumilast  (DALIRESP ) 500 MCG TABS tablet Take 0.5 tablets (250 mcg total) by mouth daily for 30 days, THEN 1 tablet (500 mcg total) daily. 45 tablet 0   tiotropium (SPIRIVA  HANDIHALER) 18 MCG inhalation capsule PLACE 1 CAPSULE (18 MCG TOTAL) INTO INHALER AND INHALE DAILY. 30 capsule 1   Vitamin D , Ergocalciferol , (DRISDOL ) 1.25 MG (50000 UNIT) CAPS capsule Take 1 capsule (50,000 Units total) by mouth every 7 (seven) days. 12 capsule 0   [DISCONTINUED] ALBUTEROL  IN Inhale into the lungs.     No current facility-administered medications on file prior to visit.    ROS: See HPI  Observations/Objective: Awake, alert, oriented x3 Not in acute distress Normal mood      Latest Ref Rng & Units 09/11/2023    3:41 AM 09/10/2023    5:49 AM 09/09/2023   10:39 AM  CMP  Glucose 70 - 99 mg/dL 886  877    BUN 8 - 23 mg/dL 20  17    Creatinine 9.38 - 1.24 mg/dL 8.85  8.89    Sodium 864 - 145 mmol/L 137  136  137   Potassium 3.5 - 5.1 mmol/L 4.2  4.4  4.4   Chloride 98 - 111 mmol/L 99  99    CO2 22 - 32 mmol/L 29  29    Calcium  8.9 - 10.3 mg/dL 9.3  9.3      Lipid Panel     Component Value Date/Time   CHOL 222 (H) 06/08/2020 1047   TRIG 76 06/08/2020 1047   HDL 48 06/08/2020 1047   CHOLHDL 4.6 06/08/2020 1047   CHOLHDL 4.1 10/26/2014 0429   VLDL 10 10/26/2014 0429   LDLCALC 161 (H) 06/08/2020 1047   LABVLDL 13 06/08/2020 1047    Lab Results  Component Value Date   HGBA1C 5.0 03/14/2022     Assessment and plan:  Assessment & Plan Chronic respiratory failure with hypoxia/ COPD Hospitalized due to malfunctioning oxygen  machine. Breathing improved with replacement. On 10L oxygen , reduced dyspnea. Continues Symbicort  and albuterol . Roflumilast  prescribed by pulmonologist. - Ensure oxygen  machine functions properly. - Continue Symbicort  and albuterol . - Contact pulmonologist for roflumilast  refill. - Schedule pulmonologist  appointment.  Essential hypertension Blood pressure managed with amlodipine  and metoprolol . Plans for home monitoring. - Refill amlodipine  and metoprolol . - Obtain home blood pressure monitor. - Check blood pressure at home regularly.  Chronic gout Gout well-managed, no recent flare-ups. Continues allopurinol  and dietary precautions. - Refill allopurinol .  Hyperlipidemia Previous cholesterol elevated. Taking Zetia  due to Lipitor allergy. Advised dietary modifications. Next test fasting. - Advise low cholesterol diet, avoid fried foods. - Schedule fasting cholesterol test at next visit.  General Health Maintenance/screening for colon cancer Due for colon cancer screening. Prefers stool test. Cologuard ordered. - Order Cologuard test.  Follow-up Advised follow-up in three months. - Schedule follow-up in three months.   There are no diagnoses linked to  this encounter.   No orders of the defined types were placed in this encounter.   Follow Up Instructions: Months   I discussed the assessment and treatment plan with the patient. The patient was provided an opportunity to ask questions and all were answered. The patient agreed with the plan and demonstrated an understanding of the instructions.   The patient was advised to call back or seek an in-person evaluation if the symptoms worsen or if the condition fails to improve as anticipated.     I provided 16 minutes total of Telehealth time during this encounter including median intraservice time, reviewing previous notes, investigations, ordering medications, medical decision making, coordinating care and patient verbalized understanding at the end of the visit.     Corrina Sabin, MD, FAAFP. [facility] [place], [place] [phone]   10/10/2023, 1:32 PM

## 2023-10-10 NOTE — Telephone Encounter (Signed)
 Copied from CRM [id number]. Topic: Clinical - Home Health Verbal Orders >> Oct 10, 2023  9:04 AM Willma SAUNDERS wrote: Caller/Agency: Dianne from [facility] Callback Number: [phone] Service Requested: COPD, Sanitation and Medication Education Frequency: 1 time a week every other week for 8 weeks Any new concerns about the patient? No

## 2023-10-10 NOTE — Telephone Encounter (Signed)
 Called patient to inform about his appointment for today has been changed to a telephone visit. Patient did not answer, left a detailed VM.

## 2023-10-10 NOTE — Telephone Encounter (Signed)
 Patient returned call. Advised appointment today has been changed to televisit. Patient verbalized understanding.

## 2023-10-10 NOTE — Patient Instructions (Signed)
 VISIT SUMMARY:  You had a follow-up appointment today to review your health after your recent hospitalization due to an oxygen  machine malfunction. Your breathing has improved since the faulty component was replaced, and you are currently on 10 liters of oxygen . We also reviewed your medications and discussed plans for managing your blood pressure, gout, and cholesterol. Additionally, we talked about your upcoming colon cancer screening.  YOUR PLAN:  -CHRONIC RESPIRATORY FAILURE WITH HYPOXIA: This condition means your lungs are not getting enough oxygen  into your blood. Your breathing has improved since the oxygen  machine was fixed. Continue using Symbicort  and albuterol  as prescribed. We will contact your pulmonologist for a refill of roflumilast  and schedule an appointment with them. Ensure your oxygen  machine is functioning properly.  -ESSENTIAL HYPERTENSION: This is high blood pressure. Your blood pressure is being managed with amlodipine  and metoprolol . We will refill these medications. Please obtain a home blood pressure monitor and check your blood pressure regularly at home.  -CHRONIC GOUT: Gout is a type of arthritis caused by high levels of uric acid in the blood. Your gout is well-managed with allopurinol  and dietary precautions. We will refill your allopurinol  prescription.  -HYPERLIPIDEMIA: This means you have high cholesterol levels. You are taking Zetia  because you are allergic to Lipitor. Please follow a low cholesterol diet and avoid fried foods. We will schedule a fasting cholesterol test at your next visit.  -GENERAL HEALTH MAINTENANCE: You are due for a colon cancer screening and prefer a stool test over a colonoscopy. We have ordered the Cologuard test for you.  INSTRUCTIONS:  Please schedule a follow-up appointment in three months. Ensure your oxygen  machine is functioning properly and contact your pulmonologist for a refill of roflumilast . Obtain a home blood pressure monitor  and check your blood pressure regularly. Follow a low cholesterol diet and avoid fried foods. Complete the Cologuard test for colon cancer screening.

## 2023-10-11 NOTE — Telephone Encounter (Signed)
Verbal orders were given 

## 2023-10-15 ENCOUNTER — Other Ambulatory Visit: Payer: Self-pay

## 2023-10-15 ENCOUNTER — Other Ambulatory Visit: Payer: Self-pay | Admitting: Family Medicine

## 2023-10-15 ENCOUNTER — Other Ambulatory Visit (HOSPITAL_COMMUNITY): Payer: Self-pay

## 2023-10-15 DIAGNOSIS — J449 Chronic obstructive pulmonary disease, unspecified: Secondary | ICD-10-CM

## 2023-10-15 MED ORDER — ALBUTEROL SULFATE (2.5 MG/3ML) 0.083% IN NEBU
2.5000 mg | INHALATION_SOLUTION | Freq: Four times a day (QID) | RESPIRATORY_TRACT | 0 refills | Status: DC | PRN
  Filled 2023-10-15 – 2023-10-25 (×2): qty 360, 30d supply, fill #0

## 2023-10-16 ENCOUNTER — Other Ambulatory Visit: Payer: Self-pay | Admitting: Family Medicine

## 2023-10-16 ENCOUNTER — Other Ambulatory Visit: Payer: Self-pay

## 2023-10-16 NOTE — Telephone Encounter (Signed)
 Patient requesting for Dr. Newlin to take over the montelukast  and roflumilast  and send to pharmacy. Patient out of both medications and requesting rx to be sent in as soon as possible.

## 2023-10-17 ENCOUNTER — Other Ambulatory Visit: Payer: Self-pay

## 2023-10-18 ENCOUNTER — Other Ambulatory Visit: Payer: Self-pay

## 2023-10-18 NOTE — Patient Outreach (Signed)
 Complex Care Management   Visit Note  10/18/2023  Name:  [name] [name]. MRN: [id number] DOB: [birth year]  Situation: Referral received for Complex Care Management related to COPD I obtained verbal consent from Patient.  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   COPD (chronic obstructive pulmonary disease) (HCC)    on 4L home O2   Essential hypertension    Gout    Thrombocytopenia (HCC) 07/28/2012    Assessment: Patient Reported Symptoms:  Cognitive Cognitive Status: Able to follow simple commands, Alert and oriented to person, place, and time, Normal speech and language skills Cognitive/Intellectual Conditions Management [RPT]: None reported or documented in medical history or problem list      Neurological Neurological Review of Symptoms: Not assessed    HEENT HEENT Symptoms Reported: Not assessed      Cardiovascular Cardiovascular Symptoms Reported: No symptoms reported Does patient have uncontrolled Hypertension?: No Is patient checking Blood Pressure at home?: No Patient's Recent BP reading at home: Patient reports BP cuff is ready for pickup. His daughter will pick it up when she is able to. Cardiovascular Management Strategies: Medical device  Respiratory Respiratory Symptoms Reported: No symptoms reported Additional Respiratory Details: Patient reports equipment is still working well. Oxygen  remains at 10 L. Patient reports oxygen  saturation 92% and above, drops with ambulation but goes back up with rest. Patient has an upcoming appointment with pulmonology 11/09/23. Respiratory Management Strategies: Oxygen  therapy  Endocrine Endocrine Symptoms Reported: Not assessed    Gastrointestinal Gastrointestinal Symptoms Reported: No symptoms reported Additional Gastrointestinal Details: Patient reports regalar BMs, no issues reported. continues  to report a good appetite.      Genitourinary Genitourinary Symptoms Reported: Not assessed    Integumentary Integumentary Symptoms Reported: Not assessed    Musculoskeletal Musculoskelatal Symptoms Reviewed: Unsteady gait Musculoskeletal Management Strategies: Medical device Falls in the past year?: No Number of falls in past year: 1 or less Was there an injury with Fall?: No Fall Risk Category Calculator: 0 Patient Fall Risk Level: Low Fall Risk Patient at Risk for Falls Due to: Impaired balance/gait Fall risk Follow up: Falls evaluation completed, Education provided  Psychosocial Psychosocial Symptoms Reported: Not assessed            10/04/2023   10:21 AM  Depression screen PHQ 2/9  Decreased Interest 0  Down, Depressed, Hopeless 0  PHQ - 2 Score 0    There were no vitals filed for this visit.  Medications Reviewed Today   Medications were not reviewed in this encounter     Recommendation:   Continue Current Plan of Care  Follow Up Plan:   Closing From:  Complex Care Management Patient has met all care management goals. Care Management case will be closed. Patient has been provided contact information should new needs arise.   [name], RN MSN [place]  VBCI Population Health RN Care Manager Direct Dial: [phone]  Fax: [phone]

## 2023-10-18 NOTE — Patient Instructions (Signed)
 Visit Information  [name] [name] was given information about Medicaid Managed Care team care coordination services as a part of their Healthy [facility] Medicaid benefit. [name] [name]. verbally consented to engagement with the [facility] Managed Care team.   If you are experiencing a medical emergency, please call 911 or report to your local emergency department or urgent care.   If you have a non-emergency medical problem during routine business hours, please contact your provider's office and ask to speak with a nurse.   For questions related to your Healthy [facility] health plan, please call: [phone] or visit the homepage here: [email or web address]  If you would like to schedule transportation through your Healthy [facility] plan, please call the following number at least 2 days in advance of your appointment: [phone]  For information about your ride after you set it up, call Ride Assist at [phone]. Use this number to activate a Will Call pickup, or if your transportation is late for a scheduled pickup. Use this number, too, if you need to make a change or cancel a previously scheduled reservation.  If you need transportation services right away, call [phone]. The after-hours call center is staffed 24 hours to handle ride assistance and urgent reservation requests (including discharges) 365 days a year. Urgent trips include sick visits, hospital discharge requests and life-sustaining treatment.  Call the [facility] Line at [phone], at any time, 24 hours a day, 7 days a week. If you are in danger or need immediate medical attention call 911.  If you would like help to quit smoking, call 1-800-QUIT-NOW (973-190-5840) OR Espaol: 1-855-Djelo-Ya (8-144-664-6430) o para ms informacin haga clic aqu or Text READY to 799-599 to register via text  [name] [name] - following are the goals we discussed in your visit today:   Goals  Addressed             This Visit's Progress    COMPLETED: VBCI RN Care Plan related to COPD   On track    Problems:  Chronic Disease Management support and education needs related to COPD  Goal: Over the next 30 days the Patient will attend all scheduled medical appointments: pulmonology 11/09/23 as evidenced by completed visit notes uploaded to EMR        demonstrate a decrease COPD in exacerbations as evidenced by patient report of stable breathing, no worsening symptoms, no increase oxygen  need not experience hospital admission as evidenced by review of electronic medical record. Hospital Admissions in last 6 months = 3 take all medications exactly as prescribed and will call provider for medication related questions as evidenced by patient report of medication compliance     Interventions:   COPD Interventions: Provided instruction about proper use of medications used for management of COPD including inhalers Reviewed oxygen  use and importance of monitoring for changes in breathing from baseline   Evaluation of current treatment plan related to COPD, Transportation self-management and patient's adherence to plan as established by provider. Discussed plans with patient for ongoing care management follow up and provided patient with direct contact information for care management team Evaluation of current treatment plan related to COPD and patient's adherence to plan as established by provider Reviewed scheduled/upcoming provider appointments including pulmonology 11/09/23 Discussed plans with patient for ongoing care management follow up and provided patient with direct contact information for care management team Discussed identified transportation barrier. Patient does not wish to use public transportation due to previously getting COVID while using. He reports he has  information on this resource. No care guide/BSW referral indicated at this time  Patient Self-Care Activities:   Attend all scheduled provider appointments Call provider office for new concerns or questions  Perform all self care activities independently  Take medications as prescribed   do breathing exercises every day eliminate symptom triggers at home keep follow-up appointments: pulmonology  Plan:  No further follow up required: Patient has met care plan goals. Will follow-up with PCP and pulmonology as scheduled.             Patient verbalizes understanding of instructions and care plan provided today and agrees to view in MyChart. Active MyChart status and patient understanding of how to access instructions and care plan via MyChart confirmed with patient.     No further follow up required: Patient has met care plan goals. Will follow-up with PCP and pulmonology as scheduled. Advised to contact the care team should needs arise.  [name], RN MSN [place]  VBCI Population Health RN Care Manager Direct Dial: [phone]  Fax: [phone]   Following is a copy of your plan of care:  There are no care plans that you recently modified to display for this patient.

## 2023-10-19 ENCOUNTER — Other Ambulatory Visit: Payer: Self-pay

## 2023-10-19 ENCOUNTER — Other Ambulatory Visit: Payer: Self-pay | Admitting: Family Medicine

## 2023-10-19 DIAGNOSIS — U071 COVID-19: Secondary | ICD-10-CM | POA: Diagnosis not present

## 2023-10-19 DIAGNOSIS — J449 Chronic obstructive pulmonary disease, unspecified: Secondary | ICD-10-CM | POA: Diagnosis not present

## 2023-10-20 DIAGNOSIS — J449 Chronic obstructive pulmonary disease, unspecified: Secondary | ICD-10-CM | POA: Diagnosis not present

## 2023-10-22 ENCOUNTER — Other Ambulatory Visit: Payer: Self-pay

## 2023-10-22 ENCOUNTER — Telehealth: Payer: Self-pay | Admitting: Family Medicine

## 2023-10-22 ENCOUNTER — Other Ambulatory Visit: Payer: Self-pay | Admitting: Family Medicine

## 2023-10-22 NOTE — Telephone Encounter (Unsigned)
 Copied from CRM [id number]. Topic: Clinical - Medication Refill >> Oct 22, 2023  9:45 AM Donna E wrote: Medication:  montelukast  (SINGULAIR ) 10 MG tablet  this has Ended a doctor in the hospital ordered this and will not refill it. Was told to have pcp refill helps with breathing roflumilast  (DALIRESP ) 500 MCG TABS tablet   Has the patient contacted their pharmacy? Yes Pharmacy stated to call PCP  This is the patient's preferred pharmacy:   [facility] [address] [place] [address] Phone: [phone] Fax: [phone]    Is this the correct pharmacy for this prescription? Yes If no, delete pharmacy and type the correct one.   Has the prescription been filled recently? Yes  Is the patient out of the medication? Yes  Has the patient been seen for an appointment in the last year OR does the patient have an upcoming appointment? Yes  Can we respond through MyChart? No  Agent: Please be advised that Rx refills may take up to 3 business days. We ask that you follow-up with your pharmacy.

## 2023-10-23 NOTE — Telephone Encounter (Signed)
 Requested medications are due for refill today.  unsure  Requested medications are on the active medications list.  yes  Last refill. 09/2023  Future visit scheduled.   no  Notes to clinic.  These medications were prescribed by hospital provider. Does PCP want pt to continue?    Requested Prescriptions  Pending Prescriptions Disp Refills   roflumilast  (DALIRESP ) 500 MCG TABS tablet 45 tablet 0    Sig: Take 0.5 tablets (250 mcg total) by mouth daily for 30 days, THEN 1 tablet (500 mcg total) daily.     Pulmonology:  Phosphodiesterase Inhibitors Passed - 10/23/2023  4:03 PM      Passed - Valid encounter within last 12 months    Recent Outpatient Visits           1 week ago COPD without exacerbation (HCC)   [place] [facility] [place]. [facility] Delbert Clam, MD   3 months ago Hospital discharge follow-up   [facility] [place] [facility] [place]. [facility] [place], Jon HERO, [place]   1 year ago Hospital discharge follow-up   [place] [facility] Celestia Rosaline SQUIBB, NP   1 year ago Screening for diabetes mellitus   [place] [facility] [place]. [facility] Delbert Clam, MD   2 years ago Chronic respiratory failure with hypoxia [facility])   [place] [facility] [place]. [facility] Del[place], Clam, MD               montelukast  (SINGULAIR ) 10 MG tablet 30 tablet 0    Sig: Take 1 tablet (10 mg total) by mouth at bedtime.     Pulmonology:  Leukotriene Inhibitors Passed - 10/23/2023  4:03 PM      Passed - Valid encounter within last 12 months    Recent Outpatient Visits           1 week ago COPD without exacerbation (HCC)   [place] [facility] [place]. [facility] Delbert Clam, MD   3 months ago Hospital discharge follow-up   [facility] [place] [facility] [place]. [facility] [place], Jon HERO, [place]   1 year ago Hospital discharge follow-up   [place] [facility] Celestia Rosaline SQUIBB, NP   1 year ago Screening for diabetes mellitus   [place] [facility] [place]. [facility] Delbert Clam, [place]   2 years ago Chronic respiratory failure with hypoxia [facility])    [facility] [place] [facility] Of [place]. [facility] Delbert Clam, MD

## 2023-10-25 ENCOUNTER — Other Ambulatory Visit: Payer: Self-pay

## 2023-10-25 MED ORDER — MONTELUKAST SODIUM 10 MG PO TABS
10.0000 mg | ORAL_TABLET | Freq: Every day | ORAL | 1 refills | Status: DC
  Filled 2023-10-25: qty 90, 90d supply, fill #0
  Filled 2024-02-06: qty 90, 90d supply, fill #1

## 2023-10-25 NOTE — Telephone Encounter (Signed)
 Copied from CRM [id number]. Topic: Clinical - Medication Question >> Oct 25, 2023 11:24 AM Elle L wrote:  Reason for CRM: The patient is requesting to see why his montelukast  (SINGULAIR ) 10 MG tablet prescription was denied. It was prescribed by the hospital and he requested a refill of it on 7/14. I advised the patient he may need an appointment but declined stating he spoke to Dr. Newlin over the phone on 7/2. The patient's call back number is [phone].

## 2023-10-25 NOTE — Addendum Note (Signed)
 Addended by: Yazmyn Valbuena on: 10/25/2023 01:58 PM   Modules accepted: Orders

## 2023-10-25 NOTE — Telephone Encounter (Signed)
I have sent the prescription to his pharmacy. Thanks.

## 2023-10-25 NOTE — Telephone Encounter (Signed)
 Looks like the hospital  ended the prescription on 10/14/23, are we able to refill?

## 2023-10-25 NOTE — Telephone Encounter (Signed)
 Called patient and he is aware no further question or concerns

## 2023-10-30 ENCOUNTER — Telehealth: Payer: Self-pay

## 2023-10-30 NOTE — Telephone Encounter (Signed)
 I called the patient and he said that his daughter needs a letter stating that she is going to care for him but he did not have any idea who the letter is for.  I spoke about the CAP services where a family member can get paid as a caregiver but to qualify for that program a person would be at risk of being in a nursing facility, ALF without the extra care that CAP provides. He said he can do everything for himself and I asked him if I could speak to his daughter, [name] [name].  He said she just left his home but he would have her call me. He confirmed my call back number.

## 2023-10-30 NOTE — Telephone Encounter (Signed)
**Note De-identified  [place] Obfuscation** Please advise 

## 2023-10-30 NOTE — Telephone Encounter (Signed)
 Copied from CRM [id number]. Topic: General - Other >> Oct 30, 2023  9:18 AM Myrick T wrote: Reason for CRM: patient requested provider call him back as he need info on how he can get paperwork done to allow his daughter to be his caretaker. Please f/u with patient

## 2023-10-31 ENCOUNTER — Encounter (HOSPITAL_COMMUNITY): Payer: Self-pay | Admitting: Internal Medicine

## 2023-10-31 ENCOUNTER — Inpatient Hospital Stay (HOSPITAL_COMMUNITY)

## 2023-10-31 ENCOUNTER — Inpatient Hospital Stay (HOSPITAL_COMMUNITY)
Admission: EM | Admit: 2023-10-31 | Discharge: 2023-11-02 | DRG: 190 | Disposition: A | Discharge status: Home / Self Care | Attending: Family Medicine | Admitting: Family Medicine

## 2023-10-31 ENCOUNTER — Emergency Department (HOSPITAL_COMMUNITY)

## 2023-10-31 ENCOUNTER — Other Ambulatory Visit: Payer: Self-pay

## 2023-10-31 DIAGNOSIS — Z91013 Allergy to seafood: Secondary | ICD-10-CM

## 2023-10-31 DIAGNOSIS — Z7951 Long term (current) use of inhaled steroids: Secondary | ICD-10-CM | POA: Diagnosis not present

## 2023-10-31 DIAGNOSIS — Z87891 Personal history of nicotine dependence: Secondary | ICD-10-CM | POA: Diagnosis not present

## 2023-10-31 DIAGNOSIS — Z79899 Other long term (current) drug therapy: Secondary | ICD-10-CM | POA: Diagnosis not present

## 2023-10-31 DIAGNOSIS — R591 Generalized enlarged lymph nodes: Secondary | ICD-10-CM | POA: Diagnosis not present

## 2023-10-31 DIAGNOSIS — J441 Chronic obstructive pulmonary disease with (acute) exacerbation: Principal | ICD-10-CM | POA: Diagnosis present

## 2023-10-31 DIAGNOSIS — J9611 Chronic respiratory failure with hypoxia: Secondary | ICD-10-CM | POA: Diagnosis present

## 2023-10-31 DIAGNOSIS — R911 Solitary pulmonary nodule: Secondary | ICD-10-CM | POA: Diagnosis not present

## 2023-10-31 DIAGNOSIS — J189 Pneumonia, unspecified organism: Secondary | ICD-10-CM | POA: Diagnosis not present

## 2023-10-31 DIAGNOSIS — M109 Gout, unspecified: Secondary | ICD-10-CM | POA: Diagnosis present

## 2023-10-31 DIAGNOSIS — R0609 Other forms of dyspnea: Secondary | ICD-10-CM

## 2023-10-31 DIAGNOSIS — R918 Other nonspecific abnormal finding of lung field: Secondary | ICD-10-CM | POA: Diagnosis present

## 2023-10-31 DIAGNOSIS — I10 Essential (primary) hypertension: Secondary | ICD-10-CM | POA: Diagnosis present

## 2023-10-31 DIAGNOSIS — Z91014 Allergy to mammalian meats: Secondary | ICD-10-CM | POA: Diagnosis not present

## 2023-10-31 DIAGNOSIS — J90 Pleural effusion, not elsewhere classified: Secondary | ICD-10-CM | POA: Diagnosis not present

## 2023-10-31 DIAGNOSIS — R0602 Shortness of breath: Secondary | ICD-10-CM | POA: Diagnosis not present

## 2023-10-31 DIAGNOSIS — Z66 Do not resuscitate: Secondary | ICD-10-CM | POA: Diagnosis present

## 2023-10-31 DIAGNOSIS — Z1152 Encounter for screening for COVID-19: Secondary | ICD-10-CM | POA: Diagnosis not present

## 2023-10-31 DIAGNOSIS — Z888 Allergy status to other drugs, medicaments and biological substances status: Secondary | ICD-10-CM

## 2023-10-31 DIAGNOSIS — Z8249 Family history of ischemic heart disease and other diseases of the circulatory system: Secondary | ICD-10-CM

## 2023-10-31 DIAGNOSIS — J440 Chronic obstructive pulmonary disease with acute lower respiratory infection: Secondary | ICD-10-CM | POA: Diagnosis not present

## 2023-10-31 DIAGNOSIS — J439 Emphysema, unspecified: Secondary | ICD-10-CM | POA: Diagnosis not present

## 2023-10-31 LAB — CBC WITH DIFFERENTIAL/PLATELET
Abs Immature Granulocytes: 0.13 K/uL — ABNORMAL HIGH (ref 0.00–0.07)
Basophils Absolute: 0.1 K/uL (ref 0.0–0.1)
Basophils Relative: 1 %
Eosinophils Absolute: 0 K/uL (ref 0.0–0.5)
Eosinophils Relative: 0 %
HCT: 45 % (ref 39.0–52.0)
Hemoglobin: 13.7 g/dL (ref 13.0–17.0)
Immature Granulocytes: 1 %
Lymphocytes Relative: 7 %
Lymphs Abs: 0.9 K/uL (ref 0.7–4.0)
MCH: 23.4 pg — ABNORMAL LOW (ref 26.0–34.0)
MCHC: 30.4 g/dL (ref 30.0–36.0)
MCV: 76.9 fL — ABNORMAL LOW (ref 80.0–100.0)
Monocytes Absolute: 0.3 K/uL (ref 0.1–1.0)
Monocytes Relative: 2 %
Neutro Abs: 11.3 K/uL — ABNORMAL HIGH (ref 1.7–7.7)
Neutrophils Relative %: 89 %
Platelets: 275 K/uL (ref 150–400)
RBC: 5.85 MIL/uL — ABNORMAL HIGH (ref 4.22–5.81)
RDW: 13.9 % (ref 11.5–15.5)
WBC: 12.8 K/uL — ABNORMAL HIGH (ref 4.0–10.5)
nRBC: 0 % (ref 0.0–0.2)

## 2023-10-31 LAB — TROPONIN I (HIGH SENSITIVITY)
Troponin I (High Sensitivity): 9 ng/L (ref <18)
Troponin I (High Sensitivity): 7 ng/L (ref <18)

## 2023-10-31 LAB — BRAIN NATRIURETIC PEPTIDE: B Natriuretic Peptide: 39.2 pg/mL (ref 0.0–100.0)

## 2023-10-31 LAB — BASIC METABOLIC PANEL WITH GFR
Anion gap: 13 (ref 5–15)
BUN: 6 mg/dL — ABNORMAL LOW (ref 8–23)
CO2: 25 mmol/L (ref 22–32)
Calcium: 9.6 mg/dL (ref 8.9–10.3)
Chloride: 99 mmol/L (ref 98–111)
Creatinine, Ser: 1.07 mg/dL (ref 0.61–1.24)
GFR, Estimated: >60 mL/min (ref >60)
Glucose, Bld: 116 mg/dL — ABNORMAL HIGH (ref 70–99)
Potassium: 3.7 mmol/L (ref 3.5–5.1)
Sodium: 137 mmol/L (ref 135–145)

## 2023-10-31 LAB — RESP PANEL BY RT-PCR (RSV, FLU A&B, COVID)  RVPGX2
Influenza A by PCR: NEGATIVE
Influenza B by PCR: NEGATIVE
Resp Syncytial Virus by PCR: NEGATIVE
SARS Coronavirus 2 by RT PCR: NEGATIVE

## 2023-10-31 MED ORDER — IPRATROPIUM-ALBUTEROL 0.5-2.5 (3) MG/3ML IN SOLN
3.0000 mL | Freq: Once | RESPIRATORY_TRACT | Status: AC
  Administered 2023-10-31: 3 mL via RESPIRATORY_TRACT
  Filled 2023-10-31: qty 3

## 2023-10-31 MED ORDER — ACETAMINOPHEN 500 MG PO TABS
1000.0000 mg | ORAL_TABLET | Freq: Four times a day (QID) | ORAL | Status: DC | PRN
  Administered 2023-11-01: 1000 mg via ORAL
  Filled 2023-10-31: qty 2

## 2023-10-31 MED ORDER — ROFLUMILAST 500 MCG PO TABS
500.0000 ug | ORAL_TABLET | Freq: Every day | ORAL | Status: DC
  Administered 2023-11-01 – 2023-11-02 (×2): 500 ug via ORAL
  Filled 2023-10-31 (×3): qty 1

## 2023-10-31 MED ORDER — ALBUTEROL SULFATE (2.5 MG/3ML) 0.083% IN NEBU
2.5000 mg | INHALATION_SOLUTION | RESPIRATORY_TRACT | Status: DC | PRN
  Administered 2023-11-01: 2.5 mg via RESPIRATORY_TRACT
  Filled 2023-10-31: qty 3

## 2023-10-31 MED ORDER — SODIUM CHLORIDE 0.9 % IV SOLN
2.0000 g | Freq: Once | INTRAVENOUS | Status: AC
  Administered 2023-10-31: 2 g via INTRAVENOUS
  Filled 2023-10-31: qty 20

## 2023-10-31 MED ORDER — METHYLPREDNISOLONE SODIUM SUCC 125 MG IJ SOLR
60.0000 mg | Freq: Two times a day (BID) | INTRAMUSCULAR | Status: DC
  Filled 2023-10-31: qty 2

## 2023-10-31 MED ORDER — MONTELUKAST SODIUM 10 MG PO TABS
10.0000 mg | ORAL_TABLET | Freq: Every day | ORAL | Status: DC
  Administered 2023-10-31 – 2023-11-01 (×2): 10 mg via ORAL
  Filled 2023-10-31 (×2): qty 1

## 2023-10-31 MED ORDER — NITROGLYCERIN 2 % TD OINT
1.0000 [in_us] | TOPICAL_OINTMENT | Freq: Once | TRANSDERMAL | Status: AC
  Administered 2023-10-31: 1 [in_us] via TOPICAL
  Filled 2023-10-31: qty 1

## 2023-10-31 MED ORDER — FLUTICASONE FUROATE-VILANTEROL 200-25 MCG/ACT IN AEPB
1.0000 | INHALATION_SPRAY | Freq: Every day | RESPIRATORY_TRACT | Status: DC
  Filled 2023-10-31: qty 28

## 2023-10-31 MED ORDER — AMLODIPINE BESYLATE 10 MG PO TABS
10.0000 mg | ORAL_TABLET | Freq: Every day | ORAL | Status: DC
  Administered 2023-11-01 – 2023-11-02 (×2): 10 mg via ORAL
  Filled 2023-10-31 (×2): qty 1

## 2023-10-31 MED ORDER — FUROSEMIDE 10 MG/ML IJ SOLN
40.0000 mg | Freq: Once | INTRAMUSCULAR | Status: AC
  Administered 2023-10-31: 40 mg via INTRAVENOUS
  Filled 2023-10-31: qty 4

## 2023-10-31 MED ORDER — METOPROLOL SUCCINATE ER 50 MG PO TB24
50.0000 mg | ORAL_TABLET | Freq: Every day | ORAL | Status: DC
  Administered 2023-11-01 – 2023-11-02 (×2): 50 mg via ORAL
  Filled 2023-10-31 (×2): qty 1

## 2023-10-31 MED ORDER — POLYETHYLENE GLYCOL 3350 17 G PO PACK: 17.0000 g | PACK | Freq: Every day | ORAL | Status: DC | PRN

## 2023-10-31 MED ORDER — DOXYCYCLINE HYCLATE 100 MG PO TABS
100.0000 mg | ORAL_TABLET | Freq: Once | ORAL | Status: AC
  Administered 2023-10-31: 100 mg via ORAL
  Filled 2023-10-31: qty 1

## 2023-10-31 MED ORDER — IPRATROPIUM-ALBUTEROL 0.5-2.5 (3) MG/3ML IN SOLN
3.0000 mL | Freq: Four times a day (QID) | RESPIRATORY_TRACT | Status: DC
  Administered 2023-11-01 (×2): 3 mL via RESPIRATORY_TRACT
  Filled 2023-10-31 (×2): qty 3

## 2023-10-31 MED ORDER — MELATONIN 3 MG PO TABS
6.0000 mg | ORAL_TABLET | Freq: Every evening | ORAL | Status: DC | PRN
  Administered 2023-11-01: 6 mg via ORAL
  Filled 2023-10-31: qty 2

## 2023-10-31 MED ORDER — SODIUM CHLORIDE 0.9 % IV SOLN
1.0000 g | INTRAVENOUS | Status: DC
  Administered 2023-11-01: 1 g via INTRAVENOUS
  Filled 2023-10-31: qty 10

## 2023-10-31 MED ORDER — SODIUM CHLORIDE 0.9% FLUSH
3.0000 mL | Freq: Two times a day (BID) | INTRAVENOUS | Status: DC
  Administered 2023-10-31 – 2023-11-02 (×4): 3 mL via INTRAVENOUS

## 2023-10-31 MED ORDER — ALLOPURINOL 100 MG PO TABS
100.0000 mg | ORAL_TABLET | Freq: Every day | ORAL | Status: DC
  Administered 2023-11-01 – 2023-11-02 (×2): 100 mg via ORAL
  Filled 2023-10-31 (×2): qty 1

## 2023-10-31 MED ORDER — EZETIMIBE 10 MG PO TABS
10.0000 mg | ORAL_TABLET | Freq: Every day | ORAL | Status: DC
  Administered 2023-11-01 – 2023-11-02 (×2): 10 mg via ORAL
  Filled 2023-10-31 (×2): qty 1

## 2023-10-31 MED ORDER — ENOXAPARIN SODIUM 40 MG/0.4ML IJ SOSY
40.0000 mg | PREFILLED_SYRINGE | INTRAMUSCULAR | Status: DC
  Administered 2023-11-01 – 2023-11-02 (×2): 40 mg via SUBCUTANEOUS
  Filled 2023-10-31 (×2): qty 0.4

## 2023-10-31 MED ORDER — DOXYCYCLINE HYCLATE 100 MG PO TABS
100.0000 mg | ORAL_TABLET | Freq: Two times a day (BID) | ORAL | Status: DC
  Administered 2023-11-01 – 2023-11-02 (×3): 100 mg via ORAL
  Filled 2023-10-31 (×3): qty 1

## 2023-10-31 MED ORDER — ONDANSETRON HCL 4 MG/2ML IJ SOLN: 4.0000 mg | Freq: Four times a day (QID) | INTRAMUSCULAR | Status: DC | PRN

## 2023-10-31 MED ORDER — METHYLPREDNISOLONE SODIUM SUCC 125 MG IJ SOLR
120.0000 mg | Freq: Every day | INTRAMUSCULAR | Status: DC
  Administered 2023-11-01 – 2023-11-02 (×2): 120 mg via INTRAVENOUS
  Filled 2023-10-31 (×2): qty 2

## 2023-10-31 MED ORDER — PREDNISONE 20 MG PO TABS
60.0000 mg | ORAL_TABLET | Freq: Once | ORAL | Status: AC
  Administered 2023-10-31: 60 mg via ORAL
  Filled 2023-10-31: qty 3

## 2023-10-31 NOTE — ED Notes (Signed)
 CCMD Called

## 2023-10-31 NOTE — ED Notes (Signed)
 Patient placed on humidifier by respiratory

## 2023-10-31 NOTE — H&P (Signed)
 History and Physical    [name] FMW:[id number] DOB: 1960-08-27 DOA: 10/31/2023  PCP: Delbert Clam, MD   Patient coming from: Home   Chief Complaint:  Chief Complaint  Patient presents with   Shortness of Breath    HPI:  Daryle Amis. is a 63 y.o. 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 Reports that this has been worse over the past few days. Thinks he caught a cold from his grandson. Does not have chronic cough. Now with cough productive with clear / sometimes darker sputum. And with upper respiratory congestion as well. He has been on 10L, but sometimes increases with activity. No fever, chills. Has been using home inhalers as Rxd    Review of Systems:  ROS complete and negative except as marked above   Allergies  Allergen Reactions   Lipitor [Atorvastatin ] Itching and Swelling    Facial, tongue swelling   Shellfish Allergy Anaphylaxis   Beef (Diagnostic) Other (See Comments)    Gout flares   Colchicine  Hives    Prior to Admission medications   Medication Sig Start Date End Date Taking? Authorizing Provider  albuterol  (PROVENTIL ) (2.5 MG/3ML) 0.083% nebulizer solution TAKE 3 MLS (2.5 MG TOTAL) BY NEBULIZATION EVERY 6 (SIX) HOURS AS NEEDED FOR SHORTNESS OF BREATH. 10/15/23   Delbert Clam, MD  albuterol  (VENTOLIN  HFA) 108 (90 Base) MCG/ACT inhaler Inhale 2 puffs into the lungs every 6 (six) hours as needed for wheezing or shortness of breath 07/11/23   Danton Jon HERO, PA-C  allopurinol  (ZYLOPRIM ) 100 MG tablet Take 1 tablet (100 mg total) by mouth daily. 10/10/23   Newlin, Enobong, MD  amLODipine  (NORVASC ) 10 MG tablet Take 1 tablet (10 mg total) by mouth daily. 10/10/23   Newlin, Enobong, MD  Ascorbic Acid  (VITAMIN C  PO) Take 1 tablet by mouth daily.    [provider]  budesonide -formoterol  (SYMBICORT )  160-4.5 MCG/ACT inhaler Inhale 2 puffs into the lungs 2 (two) times daily. 06/12/23   Mannam, Praveen, MD  Cyanocobalamin  (VITAMIN B-12 PO) Take 1 tablet by mouth daily.    [provider]  dicyclomine  (BENTYL ) 20 MG tablet Take 1 tablet (20 mg total) by mouth 3 (three) times daily as needed for spasms. Patient not taking: Reported on 10/04/2023 08/30/23   Darra Fonda MATSU, MD  ezetimibe  (ZETIA ) 10 MG tablet Take 1 tablet (10 mg total) by mouth daily. 07/11/23   Danton Jon HERO, PA-C  ibuprofen  (ADVIL ) 200 MG tablet Take 800 mg by mouth 2 (two) times daily as needed for moderate pain (pain score 4-6) or headache.    [provider]  metoprolol  succinate (TOPROL -XL) 50 MG 24 hr tablet Take 1 tablet (50 mg total) by mouth daily. 10/10/23   Newlin, Enobong, MD  Misc. Devices MISC BP monitor.  Diagnosis-hypertension 10/08/23   Newlin, Enobong, MD  montelukast  (SINGULAIR ) 10 MG tablet Take 1 tablet (10 mg total) by mouth at bedtime. 10/25/23 01/24/24  Newlin, Enobong, MD  roflumilast  (DALIRESP ) 500 MCG TABS tablet Take 0.5 tablets (250 mcg total) by mouth daily for 30 days, THEN 1 tablet (500 mcg total) daily. 09/15/23 11/14/23  Leotis Bogus, MD  tiotropium (SPIRIVA  HANDIHALER) 18 MCG inhalation capsule PLACE 1 CAPSULE (18 MCG TOTAL) INTO INHALER AND INHALE DAILY. 09/18/23   Newlin, Enobong, MD  Vitamin D , Ergocalciferol , (DRISDOL ) 1.25 MG (50000  UNIT) CAPS capsule Take 1 capsule (50,000 Units total) by mouth every 7 (seven) days. 09/18/23 12/17/23  Leotis Bogus, MD  ALBUTEROL  IN Inhale into the lungs.  07/03/11  [provider]    Past Medical History:  Diagnosis Date   Asthma 04/10/1997   CKD (chronic kidney disease), 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   COPD (chronic obstructive pulmonary disease) (HCC)    on 4L home O2   Essential hypertension    Gout    Thrombocytopenia (HCC) 07/28/2012    Past Surgical History:  Procedure Laterality Date   FRACTURE SURGERY  childhood   right arm   KNEE  SURGERY     right knee s/p trauma     reports that he quit smoking about 4 years ago. His smoking use included cigarettes. He started smoking about 49 years ago. He has a 45 pack-year smoking history. He has never used smokeless tobacco. He reports current alcohol use. He reports that he does not use drugs.  Family History  Problem Relation Age of Onset   Cancer Mother        colon    Heart disease Father    Colon cancer Other        mother ? age of diagnosis    Healthy Son    Healthy Daughter      Physical Exam: Vitals:   10/31/23 1642 10/31/23 1727 10/31/23 1848 10/31/23 2245  BP:  (!) 173/107 (!) 142/91 (!) 145/93  Pulse:  88 89 (!) 104  Resp:  (!) 26 20 (!) 21  SpO2: 96% 100% 100% 98%    Gen: Awake, alert, chronically ill appearing.  CV: Regular, soft S1, S2, -> displaced inferiorly. no murmurs  Resp: Tachypneic, mild increased WOB. On 10L O2.  Abd: Flat, normoactive, nontender MSK: Symmetric, there is 1+ edema around the ankle, none above  Skin: mild scleral icterus v pigmented sclera. No rashes or lesions to exposed skin  Neuro: Alert and interactive  Psych: euthymic, appropriate    Data review:   Labs reviewed, notable for:   WBC 12 , neutrophilic   Micro:  Results for orders placed or performed during the hospital encounter of 10/31/23  Resp panel by RT-PCR (RSV, Flu A&B, Covid) Anterior Nasal Swab     Status: None   Collection Time: 10/31/23  4:39 PM   Specimen: Anterior Nasal Swab  Result Value Ref Range Status   SARS Coronavirus 2 by RT PCR NEGATIVE NEGATIVE Final   Influenza A by PCR NEGATIVE NEGATIVE Final   Influenza B by PCR NEGATIVE NEGATIVE Final    Comment: (NOTE) The Xpert Xpress SARS-CoV-2/FLU/RSV plus assay is intended as an aid in the diagnosis of influenza from Nasopharyngeal swab specimens and should not be used as a sole basis for treatment. Nasal washings and aspirates are unacceptable for Xpert Xpress  SARS-CoV-2/FLU/RSV testing.  Fact Sheet for Patients: BloggerCourse.com  Fact Sheet for Healthcare Providers: SeriousBroker.it  This test is not yet approved or cleared by the [place]  FDA and has been authorized for detection and/or diagnosis of SARS-CoV-2 by FDA under an Emergency Use Authorization (EUA). This EUA will remain in effect (meaning this test can be used) for the duration of the COVID-19 declaration under Section 564(b)(1) of the Act, 21 U.S.C. section 360bbb-3(b)(1), unless the authorization is terminated or revoked.     Resp Syncytial Virus by PCR NEGATIVE NEGATIVE Final    Comment: (NOTE) Fact Sheet for Patients: BloggerCourse.com  Fact Sheet for Healthcare Providers: SeriousBroker.it  This test is not yet approved or cleared by the [place]  FDA and has been authorized for detection and/or diagnosis of SARS-CoV-2 by FDA under an Emergency Use Authorization (EUA). This EUA will remain in effect (meaning this test can be used) for the duration of the COVID-19 declaration under Section 564(b)(1) of the Act, 21 U.S.C. section 360bbb-3(b)(1), unless the authorization is terminated or revoked.  Performed at [facility], [address], [place], [place] [address]     Imaging reviewed:  DG Chest 2 View Result Date: 10/31/2023 CLINICAL DATA:  Shortness of breath. EXAM: CHEST - 2 VIEW COMPARISON:  09/28/2023 and CT chest 07/31/2023. FINDINGS: Trachea is midline. Heart size is at the upper limits of normal in size. Emphysema. Nodular consolidation in the suprahilar right upper lobe. Right perihilar linear scarring or atelectasis. Mild basilar interstitial prominence and indistinctness. Tiny bilateral pleural effusions. IMPRESSION: 1. Mild basilar interstitial prominence and indistinctness with tiny bilateral pleural effusions, possibly due to congestive  heart failure. A viral or atypical pneumonia cannot be excluded. 2. Nodular consolidation in the suprahilar right upper lobe, worrisome for primary bronchogenic carcinoma and better evaluated on CT chest 07/31/2023. Electronically Signed   By: Newell Eke M.D.   On: 10/31/2023 17:03    EKG:  Reviewed  ++ artifact/ wander significantly limits interpretation. SR ? LAE. Possible ST depression inferolaterally limited with artifact.   ED Course:  Treated with Prednisone  60 mg, CTX, doxycycline , nebs, lasix  40 mg IV    Assessment/Plan:  63 y.o. 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 Admitted with COPD exacerbation.    COPD exacerbation  Possible viral / atypical PNA v chronic basilar interstitial change  Chronic hypoxic respiratory failure, on 10 L  Reports + sick contacts, increased cough and sputum, with SOB. Afebrile. O2 sat wnl on his home 10L. Exam with very limited air movement. WBC 12, down from prior (although now off steroids at time of admit). Flu / COVID / RSV neg. CXR with mild basilar interstitial prominence / indistinctness and tiny pleural effusions. On review of his prior imaging on CTA in 4/'25 noted to have chronic, increased GGO and interlobular septal thickening in the bases; Also with pulm nodules and LAD see below). By history sounds like atypical / viral PNA leading to exacerbation, but would like to reassess for developing basilar ? ILD changes considering prior imaging findings.  --CT Chest with IV contrast, reeval for ILD changes / possible malignancy  --S/p Prednisone  in ED, switch to Methylprednisolone  60 mg IV q 12 hr until air movement improves.  --Ceftriaxone  1 g IV daily, Doxycycline  100 mg BID  --Check RVP, sputum culture, procalitonin  - Continue home  Symbicort  equivalent, Spiriva  non-form (he would like to bring in), Roflumilast , singulair   -- DuoNebs every 6 hours scheduled, albuterol  every 4 hours as needed, incentive spirometer, flutter valve, encourage out of bed to chair. -Home O2 evaluation prior to discharge -Consider referral to pulmonary rehab --Pulmonology f/u outpatient   Pulmonary nodules, mediastinal / hilar LAD, c/f malignancy  CTA in 4/'25 with irregular 2.1 cm lingular and 1 cm RUL nodule, hilar and mediastinal LAD which had increased findings concerning for potential malignancy  -- Discussed these findings with the patient including possibility of malignancy  -- CT chest to reeval per  above  -- Anticipate will need PET and need to see pulmonology; anticipate high risk of biopsy due to his underlying respiratory failure.   Chronic medical problems:  HTN: Continue home Amlodipine , metoprolol  (? May want to stop Bblocker since no strong indication)  CKD stage 2: Cr near baseline ~1 Gout: Continue home allopurinol   Former smoker: Noted, no active smoking     There is no height or weight on file to calculate BMI.    DVT prophylaxis:  Lovenox  Code Status:  DNR/DNI(Do NOT Intubate); confirmed with patient. Has DDNR  Diet:  Diet Orders (From admission, onward)     Start     Ordered   10/31/23 2305  Diet regular Room service appropriate? Yes; Fluid consistency: Thin  Diet effective now       Question Answer Comment  Room service appropriate? Yes   Fluid consistency: Thin      10/31/23 2305           Family Communication:  None   Consults:  None   Admission status:   Inpatient, Telemetry bed  Severity of Illness: The appropriate patient status for this patient is INPATIENT. Inpatient status is judged to be reasonable and necessary in order to provide the required intensity of service to ensure the patient's safety. The patient's presenting symptoms, physical exam findings, and initial radiographic and  laboratory data in the context of their chronic comorbidities is felt to place them at high risk for further clinical deterioration. Furthermore, it is not anticipated that the patient will be medically stable for discharge from the hospital within 2 midnights of admission.   * I certify that at the point of admission it is my clinical judgment that the patient will require inpatient hospital care spanning beyond 2 midnights from the point of admission due to high intensity of service, high risk for further deterioration and high frequency of surveillance required.*   Dorn Dawson, MD [facility]  How to contact the [facility] Attending or Consulting provider 7A - 7P or covering provider during after hours 7P -7A, for this patient.  Check the care team in [facility] and look for a) attending/consulting [facility] provider listed and b) the [facility] team listed Log into [email or web address] and use [facility] universal password to access. If you do not have the password, please contact the hospital operator. Locate the [facility] provider you are looking for under [facility] and page to a number that you can be directly reached. If you still have difficulty reaching the provider, please page the [facility] (Director on Call) for the Hospitalists listed on amion for assistance.  10/31/2023, 11:17 PM

## 2023-10-31 NOTE — ED Notes (Signed)
 Patient refused initial room assigned due to it smelling like bleach. Switched patient to another room and patient still c/o smell.

## 2023-10-31 NOTE — ED Triage Notes (Signed)
 PT BIB [facility] c/o SOB getting worse last 2 days. Smelled AXE and exacerbated the sob/copd  EMS admin   5mg  albuterol  0.5 atrovent   125mg  solumedrol 2g mag  Per EMS sounds better.   CBG 104 143/86 94HR 96% 10L baseline

## 2023-10-31 NOTE — ED Provider Notes (Signed)
 [name] [name] Provider Note   CSN: [id number] Arrival date & time: 10/31/23  1622     Patient presents with: No chief complaint on file.   [name] [name]. is a 63 y.o. male.   63 year old male with past medical history of COPD, asthma, and hypertension presenting to the emergency department today with shortness of breath.  The patient states that he has been having a cough with some mucus over the past few days.  Reports that his nephew came in today and was wearing some Axe body spray and this made him more short of breath.  The patient states that he has been having some right-sided chest discomfort with coughing.  Denies any pleuritic pain.  The patient denies any hemoptysis or leg swelling.  He came to the ER today for further evaluation due to these ongoing symptoms.        Prior to Admission medications   Medication Sig Start Date End Date Taking? Authorizing Provider  albuterol  (PROVENTIL ) (2.5 MG/3ML) 0.083% nebulizer solution TAKE 3 MLS (2.5 MG TOTAL) BY NEBULIZATION EVERY 6 (SIX) HOURS AS NEEDED FOR SHORTNESS OF BREATH. 10/15/23   Delbert Clam, MD  albuterol  (VENTOLIN  HFA) 108 (90 Base) MCG/ACT inhaler Inhale 2 puffs into the lungs every 6 (six) hours as needed for wheezing or shortness of breath 07/11/23   Danton Jon HERO, PA-C  allopurinol  (ZYLOPRIM ) 100 MG tablet Take 1 tablet (100 mg total) by mouth daily. 10/10/23   Newlin, Enobong, MD  amLODipine  (NORVASC ) 10 MG tablet Take 1 tablet (10 mg total) by mouth daily. 10/10/23   Newlin, Enobong, MD  Ascorbic Acid  (VITAMIN C  PO) Take 1 tablet by mouth daily.    [provider]  budesonide -formoterol  (SYMBICORT ) 160-4.5 MCG/ACT inhaler Inhale 2 puffs into the lungs 2 (two) times daily. 06/12/23   Mannam, Praveen, MD  Cyanocobalamin  (VITAMIN B-12 PO) Take 1 tablet by mouth daily.    [provider]  dicyclomine  (BENTYL ) 20 MG tablet Take 1 tablet (20 mg total) by mouth 3  (three) times daily as needed for spasms. Patient not taking: Reported on 10/04/2023 08/30/23   Darra Fonda MATSU, MD  ezetimibe  (ZETIA ) 10 MG tablet Take 1 tablet (10 mg total) by mouth daily. 07/11/23   Danton Jon HERO, PA-C  ibuprofen  (ADVIL ) 200 MG tablet Take 800 mg by mouth 2 (two) times daily as needed for moderate pain (pain score 4-6) or headache.    [provider]  metoprolol  succinate (TOPROL -XL) 50 MG 24 hr tablet Take 1 tablet (50 mg total) by mouth daily. 10/10/23   Newlin, Enobong, MD  Misc. Devices MISC BP monitor.  Diagnosis-hypertension 10/08/23   Newlin, Enobong, MD  montelukast  (SINGULAIR ) 10 MG tablet Take 1 tablet (10 mg total) by mouth at bedtime. 10/25/23 01/24/24  Newlin, Enobong, MD  roflumilast  (DALIRESP ) 500 MCG TABS tablet Take 0.5 tablets (250 mcg total) by mouth daily for 30 days, THEN 1 tablet (500 mcg total) daily. 09/15/23 11/14/23  Leotis Bogus, MD  tiotropium (SPIRIVA  HANDIHALER) 18 MCG inhalation capsule PLACE 1 CAPSULE (18 MCG TOTAL) INTO INHALER AND INHALE DAILY. 09/18/23   Newlin, Enobong, MD  Vitamin D , Ergocalciferol , (DRISDOL ) 1.25 MG (50000 UNIT) CAPS capsule Take 1 capsule (50,000 Units total) by mouth every 7 (seven) days. 09/18/23 12/17/23  Leotis Bogus, MD  ALBUTEROL  IN Inhale into the lungs.  07/03/11  [provider]    Allergies: Lipitor [atorvastatin ], Shellfish allergy, Beef (diagnostic), and Colchicine   Review of Systems  Respiratory:  Positive for cough and shortness of breath.   All other systems reviewed and are negative.   Updated Vital Signs BP (!) 142/91   Pulse 89   Resp 20   SpO2 100%   Physical Exam Vitals and nursing note reviewed.   Gen: Speaking in 3-4 word sentences Eyes: PERRL, EOMI HEENT: no oropharyngeal swelling Neck: trachea midline Resp: Diminished overall with very faint wheezes with forced expiration Card: RRR, no murmurs, rubs, or gallops Abd: nontender, nondistended Extremities: no calf  tenderness, no edema Vascular: 2+ radial pulses bilaterally, 2+ DP pulses bilaterally Skin: no rashes Psyc: acting appropriately   (all labs ordered are listed, but only abnormal results are displayed) Labs Reviewed  BASIC METABOLIC PANEL WITH GFR - Abnormal; Notable for the following components:      Result Value   Glucose, Bld 116 (*)    BUN 6 (*)    All other components within normal limits  CBC WITH DIFFERENTIAL/PLATELET - Abnormal; Notable for the following components:   WBC 12.8 (*)    RBC 5.85 (*)    MCV 76.9 (*)    MCH 23.4 (*)    Neutro Abs 11.3 (*)    Abs Immature Granulocytes 0.13 (*)    All other components within normal limits  RESP PANEL BY RT-PCR (RSV, FLU A&B, COVID)  [id number]  BRAIN NATRIURETIC PEPTIDE  TROPONIN I (HIGH SENSITIVITY)  TROPONIN I (HIGH SENSITIVITY)    EKG: EKG Interpretation Date/Time:  Wednesday October 31 2023 17:14:29 EDT Ventricular Rate:  98 PR Interval:  174 QRS Duration:  103 QT Interval:  381 QTC Calculation: 464 R Axis:   49  Text Interpretation: Sinus rhythm Multiform ventricular premature complexes Nonspecific T abnormalities, lateral leads Technically poor tracing Confirmed by Ula Barter [id number]) on 10/31/2023 6:41:24 PM  Radiology: [facility] Chest 2 View Result Date: 10/31/2023 CLINICAL DATA:  Shortness of breath. EXAM: CHEST - 2 VIEW COMPARISON:  09/28/2023 and CT chest 07/31/2023. FINDINGS: Trachea is midline. Heart size is at the upper limits of normal in size. Emphysema. Nodular consolidation in the suprahilar right upper lobe. Right perihilar linear scarring or atelectasis. Mild basilar interstitial prominence and indistinctness. Tiny bilateral pleural effusions. IMPRESSION: 1. Mild basilar interstitial prominence and indistinctness with tiny bilateral pleural effusions, possibly due to congestive heart failure. A viral or atypical pneumonia cannot be excluded. 2. Nodular consolidation in the suprahilar right upper lobe, worrisome for  primary bronchogenic carcinoma and better evaluated on CT chest 07/31/2023. Electronically Signed   By: Newell Eke M.D.   On: 10/31/2023 17:03     Procedures   Medications Ordered in the ED  cefTRIAXone  (ROCEPHIN ) 2 g in sodium chloride  0.9 % 100 mL IVPB (has no administration in time range)  predniSONE  (DELTASONE ) tablet 60 mg (60 mg Oral Given 10/31/23 1814)  ipratropium-albuterol  (DUONEB) 0.5-2.5 (3) MG/3ML nebulizer solution 3 mL (3 mLs Nebulization Given 10/31/23 1832)  ipratropium-albuterol  (DUONEB) 0.5-2.5 (3) MG/3ML nebulizer solution 3 mL (3 mLs Nebulization Given 10/31/23 1906)  nitroGLYCERIN  (NITROGLYN) 2 % ointment 1 inch (1 inch Topical Given 10/31/23 1949)  furosemide  (LASIX ) injection 40 mg (40 mg Intravenous Given 10/31/23 1952)  doxycycline  (VIBRA -TABS) tablet 100 mg (100 mg Oral Given 10/31/23 1948)                                    Medical Decision Making 63 year old male with past medical  history of COPD, asthma, and hypertension presenting to the emergency department today with shortness of breath and cough.  I will further evaluate the patient here with basic labs as well as an EKG, chest x-ray, and troponin to eval for ACS, pulmonary edema, pulmonary infiltrates, or pneumothorax.  Will give the patient 2 additional DuoNeb treatments here as he is very diminished.  Will also give the patient prednisone .  Will obtain an RSV/COVID/flu swab to evaluate for viral etiology as the patient does report he has been having a cough productive of some sputum over the past few days.  I will reevaluate for ultimate disposition.  The patient's labs are largely reassuring.  His x-ray shows findings consistent with likely atypical pneumonia versus pulmonary edema.  The patient is given Lasix  here in addition to nebulizer treatments and steroids.  He remains symptomatic despite this.  He was only able to walk a few feet with significant assistance and his pulse ox dropped quickly to the low  90s and he is very symptomatic.  Calls placed to hospital service for admission.  Amount and/or Complexity of Data Reviewed Labs: ordered. Radiology: ordered.  Risk Prescription drug management. Decision regarding hospitalization.        Final diagnoses:  Atypical pneumonia  Dyspnea on exertion    ED Discharge Orders     None          Ula Prentice SAUNDERS, MD 10/31/23 2223

## 2023-11-01 ENCOUNTER — Inpatient Hospital Stay (HOSPITAL_COMMUNITY)

## 2023-11-01 DIAGNOSIS — I700 Atherosclerosis of aorta: Secondary | ICD-10-CM | POA: Diagnosis not present

## 2023-11-01 DIAGNOSIS — R918 Other nonspecific abnormal finding of lung field: Secondary | ICD-10-CM | POA: Diagnosis not present

## 2023-11-01 DIAGNOSIS — J449 Chronic obstructive pulmonary disease, unspecified: Secondary | ICD-10-CM | POA: Diagnosis not present

## 2023-11-01 DIAGNOSIS — U071 COVID-19: Secondary | ICD-10-CM | POA: Diagnosis not present

## 2023-11-01 DIAGNOSIS — J441 Chronic obstructive pulmonary disease with (acute) exacerbation: Secondary | ICD-10-CM | POA: Diagnosis not present

## 2023-11-01 DIAGNOSIS — J439 Emphysema, unspecified: Secondary | ICD-10-CM | POA: Diagnosis not present

## 2023-11-01 LAB — HEPATIC FUNCTION PANEL
ALT: 13 U/L (ref 0–44)
AST: 16 U/L (ref 15–41)
Albumin: 3.5 g/dL (ref 3.5–5.0)
Alkaline Phosphatase: 83 U/L (ref 38–126)
Bilirubin, Direct: 0.2 mg/dL (ref 0.0–0.2)
Indirect Bilirubin: 0.7 mg/dL (ref 0.3–0.9)
Total Bilirubin: 0.9 mg/dL (ref 0.0–1.2)
Total Protein: 7.9 g/dL (ref 6.5–8.1)

## 2023-11-01 LAB — BASIC METABOLIC PANEL WITH GFR
Anion gap: 14 (ref 5–15)
BUN: 9 mg/dL (ref 8–23)
CO2: 26 mmol/L (ref 22–32)
Calcium: 9.8 mg/dL (ref 8.9–10.3)
Chloride: 97 mmol/L — ABNORMAL LOW (ref 98–111)
Creatinine, Ser: 1.19 mg/dL (ref 0.61–1.24)
GFR, Estimated: >60 mL/min (ref >60)
Glucose, Bld: 147 mg/dL — ABNORMAL HIGH (ref 70–99)
Potassium: 3.8 mmol/L (ref 3.5–5.1)
Sodium: 137 mmol/L (ref 135–145)

## 2023-11-01 LAB — PHOSPHORUS: Phosphorus: 3.3 mg/dL (ref 2.5–4.6)

## 2023-11-01 LAB — CBC
HCT: 45.6 % (ref 39.0–52.0)
Hemoglobin: 13.9 g/dL (ref 13.0–17.0)
MCH: 23.4 pg — ABNORMAL LOW (ref 26.0–34.0)
MCHC: 30.5 g/dL (ref 30.0–36.0)
MCV: 76.8 fL — ABNORMAL LOW (ref 80.0–100.0)
Platelets: 273 K/uL (ref 150–400)
RBC: 5.94 MIL/uL — ABNORMAL HIGH (ref 4.22–5.81)
RDW: 13.7 % (ref 11.5–15.5)
WBC: 12 K/uL — ABNORMAL HIGH (ref 4.0–10.5)
nRBC: 0 % (ref 0.0–0.2)

## 2023-11-01 LAB — PROCALCITONIN: Procalcitonin: <0.1 ng/mL

## 2023-11-01 LAB — EXPECTORATED SPUTUM ASSESSMENT W GRAM STAIN, RFLX TO RESP C

## 2023-11-01 LAB — RESPIRATORY PANEL BY PCR

## 2023-11-01 LAB — MAGNESIUM: Magnesium: 1.9 mg/dL (ref 1.7–2.4)

## 2023-11-01 MED ORDER — GUAIFENESIN-DM 100-10 MG/5ML PO SYRP
10.0000 mL | ORAL_SOLUTION | ORAL | Status: DC | PRN
  Administered 2023-11-01 – 2023-11-02 (×5): 10 mL via ORAL
  Filled 2023-11-01 (×7): qty 10

## 2023-11-01 MED ORDER — IOHEXOL 350 MG/ML SOLN
50.0000 mL | Freq: Once | INTRAVENOUS | Status: AC | PRN
  Administered 2023-11-01: 50 mL via INTRAVENOUS

## 2023-11-01 MED ORDER — FLUTICASONE FUROATE-VILANTEROL 200-25 MCG/ACT IN AEPB
1.0000 | INHALATION_SPRAY | Freq: Every day | RESPIRATORY_TRACT | Status: DC
  Administered 2023-11-01: 1 via RESPIRATORY_TRACT
  Filled 2023-11-01 (×2): qty 28

## 2023-11-01 MED ORDER — GUAIFENESIN ER 600 MG PO TB12
600.0000 mg | ORAL_TABLET | Freq: Two times a day (BID) | ORAL | Status: DC
  Administered 2023-11-01 – 2023-11-02 (×3): 600 mg via ORAL
  Filled 2023-11-01 (×3): qty 1

## 2023-11-01 MED ORDER — IPRATROPIUM-ALBUTEROL 0.5-2.5 (3) MG/3ML IN SOLN
3.0000 mL | Freq: Two times a day (BID) | RESPIRATORY_TRACT | Status: DC
  Administered 2023-11-01 – 2023-11-02 (×2): 3 mL via RESPIRATORY_TRACT
  Filled 2023-11-01 (×2): qty 3

## 2023-11-01 NOTE — Progress Notes (Signed)
 PROGRESS NOTE    [name] [name].  FMW:[id number] DOB: 08/13/1960 DOA: 10/31/2023 PCP: Delbert Clam, MD   Brief Narrative:  [name] [name]. is a 63 y.o. 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 Reports that this has been worse over the past few days. Thinks he caught a cold from his grandson. Does not have chronic cough. Now with cough productive with clear / sometimes darker sputum. And with upper respiratory congestion as well. He has been on 10L, but sometimes increases with activity. No fever, chills. Has been using home inhalers as Rxd.  Assessment & Plan:   Principal Problem:   COPD exacerbation (HCC) Active Problems:   Pulmonary nodules   Chronic hypoxic respiratory failure (HCC)  Chronic hypoxic respiratory failure/acute COPD exacerbation/atypical pneumonia versus chronic basilar interstitial changes/pulmonary nodule/mediastinal and hilar lymphadenopathy, POA: Patient has severe COPD, dependent on 10 L of oxygen  at rest and sometimes bumped to 15 L with activity.  Came in with worsening shortness of breath, cannot walk into the bathroom without shortness of breath.  Tested negative for flu, COVID and RSV.CXR with mild basilar interstitial prominence / indistinctness and tiny pleural effusions.  CT chest with IV contrast pending to reveal ILD changes and possible malignancy.  No wheezes on exam.  Overall feels slightly better.  To me, he appears to be at baseline.  Able to speak in full sentences.  Discussed plan of discharge with him.  In my opinion, patient is at baseline and can be discharged but patient is very apprehensive and does not feel comfortable going home.  Says  I think I should stay in the hospital for 2-3 more days another lengthy discussion with the patient that he appears at baseline and that we are going to give him  another Solu-Medrol , we will walk him and check his oximetry.  Only because he is not feeling comfortable going home, we are planning to keep him overnight however if he shows further improvement or remains stable by tomorrow, he will be discharged home.  Continue bronchodilators and Rocephin .  Respiratory viral panel pending.  Hypertension: Controlled, continue home amlodipine  and Toprol -XL.  Gout: Continue home allopurinol .  CKD ruled out.  DVT prophylaxis: enoxaparin  (LOVENOX ) injection 40 mg Start: 11/01/23 1000   Code Status: Limited: Do not attempt resuscitation (DNR) -DNR-LIMITED -Do Not Intubate/DNI   Family Communication:  None present at bedside.  Plan of care discussed with patient in length and he/she verbalized understanding and agreed with it.  Status is: Inpatient Remains inpatient appropriate because: Does not feel comfortable going home.   Estimated body mass index is 29.05 kg/m as calculated from the following:   Height as of this encounter: 5' 6 (1.676 m).   Weight as of this encounter: 81.6 kg.    Nutritional Assessment: Body mass index is 29.05 kg/m.SABRA Seen by dietician.  I agree with the assessment and plan as outlined below: Nutrition Status:        . Skin Assessment: I have examined the patient's skin and I agree with the wound assessment as performed by the wound care RN as outlined below:    Consultants:  None  Procedures:  None  Antimicrobials:  Anti-infectives (From admission, onward)    Start     Dose/Rate Route Frequency Ordered Stop   11/01/23 2200  cefTRIAXone  (ROCEPHIN ) 1 g in  sodium chloride  0.9 % 100 mL IVPB        1 g 200 mL/hr over 30 Minutes Intravenous Every 24 hours 10/31/23 2302 11/05/23 2159   11/01/23 1000  doxycycline  (VIBRA -TABS) tablet 100 mg        100 mg Oral Every 12 hours 10/31/23 2302 11/06/23 0959   10/31/23 2230  cefTRIAXone  (ROCEPHIN ) 2 g in sodium chloride  0.9 % 100 mL IVPB        2 g 200 mL/hr over 30 Minutes  Intravenous  Once 10/31/23 2220 10/31/23 2357   10/31/23 1845  doxycycline  (VIBRA -TABS) tablet 100 mg        100 mg Oral  Once 10/31/23 1842 10/31/23 1948         Subjective: Seen and examined in the room, overall feels better.  No new complaint.  Objective: Vitals:   11/01/23 0700 11/01/23 0813 11/01/23 0819 11/01/23 0843  BP: (!) 125/111 (!) 152/86    Pulse: 98 (!) 101  97  Resp: (!) 22   20  Temp:  98.6 F (37 C)    TempSrc:  Oral    SpO2: 100% 100%  98%  Weight:   81.6 kg   Height:   5' 6 (1.676 m)     Intake/Output Summary (Last 24 hours) at 11/01/2023 1138 Last data filed at 11/01/2023 0900 Gross per 24 hour  Intake 100 ml  Output 250 ml  Net -150 ml   Filed Weights   11/01/23 0819  Weight: 81.6 kg    Examination:  General exam: Appears calm and comfortable  Respiratory system: Diminished breath sounds bilaterally, no wheezes. Respiratory effort normal. Cardiovascular system: S1 & S2 heard, RRR. No JVD, murmurs, rubs, gallops or clicks. No pedal edema. Gastrointestinal system: Abdomen is nondistended, soft and nontender. No organomegaly or masses felt. Normal bowel sounds heard. Central nervous system: Alert and oriented. No focal neurological deficits. Extremities: Symmetric 5 x 5 power. Skin: No rashes, lesions or ulcers Psychiatry: Judgement and insight appear normal. Mood & affect appropriate.    Data Reviewed: I have personally reviewed following labs and imaging studies  CBC: Recent Labs  Lab 10/31/23 1815 11/01/23 0351  WBC 12.8* 12.0*  NEUTROABS 11.3*  --   HGB 13.7 13.9  HCT 45.0 45.6  MCV 76.9* 76.8*  PLT 275 273   Basic Metabolic Panel: Recent Labs  Lab 10/31/23 1815 11/01/23 0351  NA 137 137  K 3.7 3.8  CL 99 97*  CO2 25 26  GLUCOSE 116* 147*  BUN 6* 9  CREATININE 1.07 1.19  CALCIUM  9.6 9.8  MG  --  1.9  PHOS  --  3.3   GFR: Estimated Creatinine Clearance: 62.9 mL/min (by C-G formula based on SCr of 1.19 mg/dL). Liver  Function Tests: Recent Labs  Lab 10/31/23 1943  AST 16  ALT 13  ALKPHOS 83  BILITOT 0.9  PROT 7.9  ALBUMIN 3.5   No results for input(s): LIPASE, AMYLASE in the last 168 hours. No results for input(s): AMMONIA in the last 168 hours. Coagulation Profile: No results for input(s): INR, PROTIME in the last 168 hours. Cardiac Enzymes: No results for input(s): CKTOTAL, CKMB, CKMBINDEX, TROPONINI in the last 168 hours. BNP (last 3 results) No results for input(s): PROBNP in the last 8760 hours. HbA1C: No results for input(s): HGBA1C in the last 72 hours. CBG: No results for input(s): GLUCAP in the last 168 hours. Lipid Profile: No results for input(s): CHOL, HDL, LDLCALC, TRIG, CHOLHDL, LDLDIRECT  in the last 72 hours. Thyroid  Function Tests: No results for input(s): TSH, T4TOTAL, FREET4, T3FREE, THYROIDAB in the last 72 hours. Anemia Panel: No results for input(s): VITAMINB12, FOLATE, FERRITIN, TIBC, IRON , RETICCTPCT in the last 72 hours. Sepsis Labs: Recent Labs  Lab 11/01/23 0351  PROCALCITON <0.10    Recent Results (from the past 240 hours)  Resp panel by RT-PCR (RSV, Flu A&B, Covid) Anterior Nasal Swab     Status: None   Collection Time: 10/31/23  4:39 PM   Specimen: Anterior Nasal Swab  Result Value Ref Range Status   SARS Coronavirus 2 by RT PCR NEGATIVE NEGATIVE Final   Influenza A by PCR NEGATIVE NEGATIVE Final   Influenza B by PCR NEGATIVE NEGATIVE Final    Comment: (NOTE) The Xpert Xpress SARS-CoV-2/FLU/RSV plus assay is intended as an aid in the diagnosis of influenza from Nasopharyngeal swab specimens and should not be used as a sole basis for treatment. Nasal washings and aspirates are unacceptable for Xpert Xpress SARS-CoV-2/FLU/RSV testing.  Fact Sheet for Patients: BloggerCourse.com  Fact Sheet for Healthcare Providers: SeriousBroker.it  This  test is not yet approved or cleared by the [place]  FDA and has been authorized for detection and/or diagnosis of SARS-CoV-2 by FDA under an Emergency Use Authorization (EUA). This EUA will remain in effect (meaning this test can be used) for the duration of the COVID-19 declaration under Section 564(b)(1) of the Act, 21 U.S.C. section 360bbb-3(b)(1), unless the authorization is terminated or revoked.     Resp Syncytial Virus by PCR NEGATIVE NEGATIVE Final    Comment: (NOTE) Fact Sheet for Patients: BloggerCourse.com  Fact Sheet for Healthcare Providers: SeriousBroker.it  This test is not yet approved or cleared by the [place]  FDA and has been authorized for detection and/or diagnosis of SARS-CoV-2 by FDA under an Emergency Use Authorization (EUA). This EUA will remain in effect (meaning this test can be used) for the duration of the COVID-19 declaration under Section 564(b)(1) of the Act, 21 U.S.C. section 360bbb-3(b)(1), unless the authorization is terminated or revoked.  Performed at [facility], [address], [place], [place] [address]      Radiology Studies: DG Chest 2 View Result Date: 10/31/2023 CLINICAL DATA:  Shortness of breath. EXAM: CHEST - 2 VIEW COMPARISON:  09/28/2023 and CT chest 07/31/2023. FINDINGS: Trachea is midline. Heart size is at the upper limits of normal in size. Emphysema. Nodular consolidation in the suprahilar right upper lobe. Right perihilar linear scarring or atelectasis. Mild basilar interstitial prominence and indistinctness. Tiny bilateral pleural effusions. IMPRESSION: 1. Mild basilar interstitial prominence and indistinctness with tiny bilateral pleural effusions, possibly due to congestive heart failure. A viral or atypical pneumonia cannot be excluded. 2. Nodular consolidation in the suprahilar right upper lobe, worrisome for primary bronchogenic carcinoma and better evaluated  on CT chest 07/31/2023. Electronically Signed   By: Newell Eke M.D.   On: 10/31/2023 17:03    Scheduled Meds:  allopurinol   100 mg Oral Daily   amLODipine   10 mg Oral Daily   doxycycline   100 mg Oral Q12H   enoxaparin  (LOVENOX ) injection  40 mg Subcutaneous Q24H   ezetimibe   10 mg Oral Daily   fluticasone  furoate-vilanterol  1 puff Inhalation QHS   guaiFENesin   600 mg Oral BID   ipratropium-albuterol   3 mL Nebulization BID   methylPREDNISolone  (SOLU-MEDROL ) injection  120 mg Intravenous Daily   metoprolol  succinate  50 mg Oral Daily   montelukast   10 mg Oral QHS  roflumilast   500 mcg Oral Daily   sodium chloride  flush  3 mL Intravenous Q12H   Continuous Infusions:  cefTRIAXone  (ROCEPHIN )  IV       LOS: 1 day   Fredia Skeeter, MD [facility]  11/01/2023, 11:38 AM   *Please note that this is a verbal dictation therefore any spelling or grammatical errors are due to the Dragon Medical One system interpretation.  Please page via Amion and do not message via secure chat for urgent patient care matters. Secure chat can be used for non urgent patient care matters.  How to contact the [facility] Attending or Consulting provider 7A - 7P or covering provider during after hours 7P -7A, for this patient?  Check the care team in [facility] and look for a) attending/consulting [facility] provider listed and b) the [facility] team listed. Page or secure chat 7A-7P. Log into [email or web address] and use [facility]'s universal password to access. If you do not have the password, please contact the hospital operator. Locate the [facility] provider you are looking for under [facility] and page to a number that you can be directly reached. If you still have difficulty reaching the provider, please page the [facility] (Director on Call) for the Hospitalists listed on amion for assistance.

## 2023-11-01 NOTE — Plan of Care (Signed)

## 2023-11-01 NOTE — Progress Notes (Signed)
   11/01/23 0108  BiPAP/CPAP/SIPAP  $ Non-Invasive Ventilator  Non-Invasive Vent Initial  $ Face Mask Medium Yes  BiPAP/CPAP/SIPAP Pt Type Adult  BiPAP/CPAP/SIPAP Resmed  Mask Type Full face mask  Mask Size Medium  EPAP 8 cmH2O  FiO2 (%) 32 %  Flow Rate 3 lpm  Patient Home Machine No  Patient Home Mask No  Patient Home Tubing No  Auto Titrate No  Device Plugged into RED Power Outlet Yes  BiPAP/CPAP /SiPAP Vitals  Pulse Rate 98  Resp (!) 25  SpO2 93 %  Bilateral Breath Sounds Diminished  MEWS Score/Color  MEWS Score 1  MEWS Score Color Green

## 2023-11-02 ENCOUNTER — Other Ambulatory Visit (HOSPITAL_COMMUNITY): Payer: Self-pay

## 2023-11-02 DIAGNOSIS — R918 Other nonspecific abnormal finding of lung field: Secondary | ICD-10-CM | POA: Diagnosis not present

## 2023-11-02 DIAGNOSIS — J441 Chronic obstructive pulmonary disease with (acute) exacerbation: Secondary | ICD-10-CM | POA: Diagnosis not present

## 2023-11-02 LAB — CBC WITH DIFFERENTIAL/PLATELET
Abs Immature Granulocytes: 0.23 K/uL — ABNORMAL HIGH (ref 0.00–0.07)
Basophils Absolute: 0 K/uL (ref 0.0–0.1)
Basophils Relative: 0 %
Eosinophils Absolute: 0 K/uL (ref 0.0–0.5)
Eosinophils Relative: 0 %
HCT: 43.5 % (ref 39.0–52.0)
Hemoglobin: 13.7 g/dL (ref 13.0–17.0)
Immature Granulocytes: 1 %
Lymphocytes Relative: 6 %
Lymphs Abs: 1.4 K/uL (ref 0.7–4.0)
MCH: 23.9 pg — ABNORMAL LOW (ref 26.0–34.0)
MCHC: 31.5 g/dL (ref 30.0–36.0)
MCV: 75.8 fL — ABNORMAL LOW (ref 80.0–100.0)
Monocytes Absolute: 1.7 K/uL — ABNORMAL HIGH (ref 0.1–1.0)
Monocytes Relative: 8 %
Neutro Abs: 19.3 K/uL — ABNORMAL HIGH (ref 1.7–7.7)
Neutrophils Relative %: 85 %
Platelets: 220 K/uL (ref 150–400)
RBC: 5.74 MIL/uL (ref 4.22–5.81)
RDW: 14 % (ref 11.5–15.5)
WBC: 22.7 K/uL — ABNORMAL HIGH (ref 4.0–10.5)
nRBC: 0 % (ref 0.0–0.2)

## 2023-11-02 MED ORDER — DOXYCYCLINE HYCLATE 100 MG PO TABS
100.0000 mg | ORAL_TABLET | Freq: Two times a day (BID) | ORAL | 0 refills | Status: AC
  Filled 2023-11-02: qty 8, 4d supply, fill #0

## 2023-11-02 MED ORDER — PREDNISONE 50 MG PO TABS
50.0000 mg | ORAL_TABLET | Freq: Every day | ORAL | 0 refills | Status: AC
  Filled 2023-11-02: qty 4, 4d supply, fill #0

## 2023-11-02 NOTE — Progress Notes (Signed)
 Transition of Care [facility]) - Inpatient Brief Assessment   Patient Details  Name: [name] [name]. MRN: [id number] Date of Birth: [birth year]  Transition of Care [facility]) CM/SW Contact:    Rosaline JONELLE Joe, RN Phone Number: 11/02/2023, 10:59 AM   Clinical Narrative: Patient admitted for COPD exacerbation.  Patient is normally at 10l/min  baseline and plans to discharge home today with family.  Patient states that family is bringing his home portable oxygen  tank when discharged home.  DME at the home includes home oxygen  - unknown company per patient, CPAP, RW, Cane, WC.  Patient plans to call family for ride home when stable for discharge today.  No other IP Care Management needs at this time and patient will be discharged home by bedside nursing.   Transition of Care Asessment: Insurance and Status: (P) Insurance coverage has been reviewed Patient has primary care physician: (P) Yes Home environment has been reviewed: (P) from home with family Prior level of function:: (P) RW, WC - self Prior/Current Home Services: (P) No current home services Social Drivers of Health Review: (P) SDOH reviewed no interventions necessary Readmission risk has been reviewed: (P) Yes Transition of care needs: (P) no transition of care needs at this time

## 2023-11-02 NOTE — Consult Note (Signed)
 NAME:  [name] Lotts., MRN:  [id number], DOB:  11-13-1960, LOS: 2 ADMISSION DATE:  10/31/2023, CONSULTATION DATE:  11/02/23 REFERRING MD:  [place], CHIEF COMPLAINT:  abnormal CT chest    History of Present Illness:  63 yo DNR status M w PMH end stage COPD on 10L baseline, lung nodules/associated lymphadenopathy who was  admitted to [facility] 7/23 night after presenting w worsening SOB. A CT chest was obtained as part of his workup which showed lung nodules, including persistent RUL spiculated nodule and a new irregular LUL density  Felt progressively better w steroids abx and on 7/25 feels at baseline and wants to go home  PCCM is consulted 7/25 for bronch eval   Pertinent  Medical History  COPD Hypoxia Lung nodules   Significant Hospital Events: Including procedures, antibiotic start and stop dates in addition to other pertinent events     Interim History / Subjective:  Wants to go home   Objective    Blood pressure (!) 144/94, pulse 86, temperature 98.2 F (36.8 C), resp. rate 19, height 5' 6 (1.676 m), weight 81.6 kg, SpO2 97%.        Intake/Output Summary (Last 24 hours) at 11/02/2023 1313 Last data filed at 11/02/2023 9347 Gross per 24 hour  Intake 800 ml  Output 1025 ml  Net -225 ml   Filed Weights   11/01/23 0819  Weight: 81.6 kg    Examination: General: chronically ill M NAD  HENT: [place] in place Lungs: no rhonchi  Cardiovascular: cap refill < 3 sec  Abdomen: soft abd  Extremities: no acute joint deformity  Neuro: AAOx4 GU: defer   Resolved problem list   Assessment and Plan   COPD Chronic hypoxic respiratory failure Lung nodules, c/f malignancy DNR status  -we talked about his CT findings, concern for possible malignancy. He does not want to pursue bronchoscopy.  He sites God's plan and timeline, and that if he has cancer he does not want to know and just wants to go on living his life until God calls him.  -he has outpt follow up with Dr. Theophilus already  scheduled 8/1 -discharging home 7/25 w PO steroids and abx   Best Practice (right click and Reselect all SmartList Selections daily)   Diet/type: Regular consistency (see orders) DVT prophylaxis not indicated Pressure ulcer(s): pressure ulcer assessment deferred  GI prophylaxis: N/A Lines: N/A Foley:  N/A Code Status:  DNR Last date of multidisciplinary goals of care discussion [--]  Labs   CBC: Recent Labs  Lab 10/31/23 1815 11/01/23 0351 11/02/23 0157  WBC 12.8* 12.0* 22.7*  NEUTROABS 11.3*  --  19.3*  HGB 13.7 13.9 13.7  HCT 45.0 45.6 43.5  MCV 76.9* 76.8* 75.8*  PLT 275 273 220    Basic Metabolic Panel: Recent Labs  Lab 10/31/23 1815 11/01/23 0351  NA 137 137  K 3.7 3.8  CL 99 97*  CO2 25 26  GLUCOSE 116* 147*  BUN 6* 9  CREATININE 1.07 1.19  CALCIUM  9.6 9.8  MG  --  1.9  PHOS  --  3.3   GFR: Estimated Creatinine Clearance: 62.9 mL/min (by C-G formula based on SCr of 1.19 mg/dL). Recent Labs  Lab 10/31/23 1815 11/01/23 0351 11/02/23 0157  PROCALCITON  --  <0.10  --   WBC 12.8* 12.0* 22.7*    Liver Function Tests: Recent Labs  Lab 10/31/23 1943  AST 16  ALT 13  ALKPHOS 83  BILITOT 0.9  PROT 7.9  ALBUMIN  3.5   No results for input(s): LIPASE, AMYLASE in the last 168 hours. No results for input(s): AMMONIA in the last 168 hours.  ABG    Component Value Date/Time   PHART 7.48 (H) 06/19/2023 0530   PCO2ART 53 (H) 06/19/2023 0530   PO2ART 60 (L) 06/19/2023 0530   HCO3 34.4 (H) 09/10/2023 0549   TCO2 30 09/09/2023 1039   O2SAT 73.9 09/10/2023 0549     Coagulation Profile: No results for input(s): INR, PROTIME in the last 168 hours.  Cardiac Enzymes: No results for input(s): CKTOTAL, CKMB, CKMBINDEX, TROPONINI in the last 168 hours.  HbA1C: Hgb A1c MFr Bld  Date/Time Value Ref Range Status  03/14/2022 02:20 PM 5.0 4.8 - 5.6 % Final    Comment:             Prediabetes: 5.7 - 6.4          Diabetes: >6.4           Glycemic control for adults with diabetes: <7.0   10/26/2014 04:29 AM 5.2 4.8 - 5.6 % Final    Comment:    (NOTE)         Pre-diabetes: 5.7 - 6.4         Diabetes: >6.4         Glycemic control for adults with diabetes: <7.0     CBG: No results for input(s): GLUCAP in the last 168 hours.  Review of Systems:   Denies acute complaints   Past Medical History:  He,  has a past medical history of Asthma (04/10/1997), CKD (chronic kidney disease), stage II (08/03/2012), COPD (chronic obstructive pulmonary disease) (HCC), Essential hypertension, Gout, and Thrombocytopenia (HCC) (07/28/2012).   Surgical History:   Past Surgical History:  Procedure Laterality Date   FRACTURE SURGERY  childhood   right arm   KNEE SURGERY     right knee s/p trauma     Social History:   reports that he quit smoking about 4 years ago. His smoking use included cigarettes. He started smoking about 49 years ago. He has a 45 pack-year smoking history. He has never used smokeless tobacco. He reports current alcohol use. He reports that he does not use drugs.   Family History:  His family history includes Cancer in his mother; Colon cancer in an other family member; Healthy in his daughter and son; Heart disease in his father.   Allergies Allergies  Allergen Reactions   Lipitor [Atorvastatin ] Itching and Swelling    Facial, tongue swelling   Shellfish Allergy Anaphylaxis   Beef (Diagnostic) Other (See Comments)    Gout flares   Colcrys  [Colchicine ] Hives     Home Medications  Prior to Admission medications   Medication Sig Start Date End Date Taking? Authorizing Provider  albuterol  (PROVENTIL ) (2.5 MG/3ML) 0.083% nebulizer solution TAKE 3 MLS (2.5 MG TOTAL) BY NEBULIZATION EVERY 6 (SIX) HOURS AS NEEDED FOR SHORTNESS OF BREATH. Patient taking differently: Take 2.5 mg by nebulization See admin instructions. Inhale 1 vial (2.5mg ) via nebulizer twice daily, in the morning and at bedtime. May use every  6 hours as needed for wheezing or shortness of breath. 10/15/23  Yes Newlin, Enobong, MD  albuterol  (VENTOLIN  HFA) 108 (90 Base) MCG/ACT inhaler Inhale 2 puffs into the lungs every 6 (six) hours as needed for wheezing or shortness of breath Patient taking differently: Inhale 2 puffs into the lungs 2 (two) times daily. 07/11/23  Yes Danton Jon HERO, PA-C  allopurinol  (ZYLOPRIM ) 100 MG tablet  Take 1 tablet (100 mg total) by mouth daily. 10/10/23  Yes Newlin, Enobong, MD  amLODipine  (NORVASC ) 10 MG tablet Take 1 tablet (10 mg total) by mouth daily. 10/10/23  Yes Newlin, Enobong, MD  budesonide -formoterol  (SYMBICORT ) 160-4.5 MCG/ACT inhaler Inhale 2 puffs into the lungs 2 (two) times daily. 06/12/23  Yes Mannam, Praveen, MD  ezetimibe  (ZETIA ) 10 MG tablet Take 1 tablet (10 mg total) by mouth daily. 07/11/23  Yes McClung, Angela M, PA-C  ibuprofen  (ADVIL ) 200 MG tablet Take 800 mg by mouth daily as needed for moderate pain (pain score 4-6) or headache.   Yes [provider]  metoprolol  succinate (TOPROL -XL) 50 MG 24 hr tablet Take 1 tablet (50 mg total) by mouth daily. 10/10/23  Yes Newlin, Enobong, MD  montelukast  (SINGULAIR ) 10 MG tablet Take 1 tablet (10 mg total) by mouth at bedtime. 10/25/23 01/24/24 Yes Newlin, Enobong, MD  predniSONE  (DELTASONE ) 50 MG tablet Take 1 tablet (50 mg total) by mouth daily with breakfast for 4 days. 11/02/23 11/06/23 Yes Pahwani, Fredia, MD  roflumilast  (DALIRESP ) 500 MCG TABS tablet Take 0.5 tablets (250 mcg total) by mouth daily for 30 days, THEN 1 tablet (500 mcg total) daily. Patient taking differently: Take 1 tablet (500mg ) by mouth daily. 09/15/23 11/14/23 Yes Leotis Bogus, MD  tiotropium (SPIRIVA  HANDIHALER) 18 MCG inhalation capsule PLACE 1 CAPSULE (18 MCG TOTAL) INTO INHALER AND INHALE DAILY. Patient taking differently: Place 1 capsule into inhaler and inhale at bedtime. 09/18/23  Yes Newlin, Corrina, MD  Vitamin D , Ergocalciferol , (DRISDOL ) 1.25 MG (50000 UNIT) CAPS  capsule Take 1 capsule (50,000 Units total) by mouth every 7 (seven) days. Patient taking differently: Take 50,000 Units by mouth every Tuesday. 09/18/23 12/17/23 Yes Leotis Bogus, MD  doxycycline  (VIBRA -TABS) 100 MG tablet Take 1 tablet (100 mg total) by mouth every 12 (twelve) hours for 4 days. 11/02/23 11/06/23  Vernon Fredia, MD  ALBUTEROL  IN Inhale into the lungs.  07/03/11  [provider]     N/a  Ronnald Gave MSN, AGACNP-BC [facility] 11/02/2023, 1:13 PM

## 2023-11-02 NOTE — Discharge Summary (Signed)
 Physician Discharge Summary  [name] [name]. FMW:[id number] DOB: [birth year] DOA: 10/31/2023  PCP: Delbert Clam, MD  Admit date: 10/31/2023 Discharge date: 11/02/2023 30 Day Unplanned Readmission Risk Score    Flowsheet Row ED to Hosp-Admission (Current) from 10/31/2023 in [place] [facility]  30 Day Unplanned Readmission Risk Score (%) 15.45 Filed at 11/02/2023 1200    This score is the patient's risk of an unplanned readmission within 30 days of being discharged (0 -100%). The score is based on dignosis, age, lab data, medications, orders, and past utilization.   Low:  0-14.9   Medium: 15-21.9   High: 22-29.9   Extreme: 30 and above          Admitted From: Home Disposition: Home  Recommendations for Outpatient Follow-up:  Follow up with PCP in 1-2 weeks Please obtain BMP/CBC in one week Please follow up with your PCP on the following pending results: Unresulted Labs (From admission, onward)     Start     Ordered   11/02/23 0419  Expectorated Sputum Assessment w Gram Stain, Rflx to Resp Cult  Once,   R        11/02/23 0418              Home Health: None Equipment/Devices: Oxygen  that he already has  Discharge Condition: Stable CODE STATUS: DNR Diet recommendation: Cardiac  Subjective: Seen and examined, feeling much better and very close to his baseline.  Today he is eager to go home.  Brief/Interim Summary: [name] [name]. is a 63 y.o. 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  Details below.   Chronic hypoxic respiratory failure/acute COPD exacerbation/atypical pneumonia versus chronic basilar interstitial changes/pulmonary nodule/mediastinal and hilar lymphadenopathy, POA: Patient has severe COPD, dependent on 10 L of oxygen  at rest and  sometimes bumpes to 15 L with activity.  Tested negative for flu, COVID and RSV.CXR with mild basilar interstitial prominence / indistinctness and tiny pleural effusions.  CT chest with IV contrast shows persistent focus of nodular spiculation in the posterior right upper lobe concerning for small neoplasm.  No wheezes on exam.  Overall feels much better.  Patient was on IV Solu-Medrol  and steroids while here.  He will be discharged on 4 days of prednisone  and oral antibiotics.  For possible malignancy, I consulted PCCM, they are going to see him in the clinic on 11/09/2023 and will discuss further with him.  Patient has been informed and seen by them.    Hypertension: Controlled, continue home amlodipine  and Toprol -XL.   Gout: Continue home allopurinol .   CKD ruled out.  Discharge plan was discussed with patient and/or family member and they verbalized understanding and agreed with it.  Discharge Diagnoses:  Principal Problem:   COPD exacerbation (HCC) Active Problems:   Pulmonary nodules   Chronic hypoxic respiratory failure (HCC)    Discharge Instructions   Allergies as of 11/02/2023       Reactions   Lipitor [atorvastatin ] Itching, Swelling   Facial, tongue swelling   Shellfish Allergy Anaphylaxis   Beef (diagnostic) Other (See Comments)   Gout flares   Colcrys  [colchicine ] Hives        Medication List     TAKE these medications    allopurinol  100 MG tablet Commonly known as: ZYLOPRIM   Take 1 tablet (100 mg total) by mouth daily.   amLODipine  10 MG tablet Commonly known as: NORVASC  Take 1 tablet (10 mg total) by mouth daily.   doxycycline  100 MG tablet Commonly known as: VIBRA -TABS Take 1 tablet (100 mg total) by mouth every 12 (twelve) hours for 4 days.   ezetimibe  10 MG tablet Commonly known as: ZETIA  Take 1 tablet (10 mg total) by mouth daily.   ibuprofen  200 MG tablet Commonly known as: ADVIL  Take 800 mg by mouth daily as needed for moderate pain (pain  score 4-6) or headache.   metoprolol  succinate 50 MG 24 hr tablet Commonly known as: TOPROL -XL Take 1 tablet (50 mg total) by mouth daily.   montelukast  10 MG tablet Commonly known as: SINGULAIR  Take 1 tablet (10 mg total) by mouth at bedtime.   predniSONE  50 MG tablet Commonly known as: DELTASONE  Take 1 tablet (50 mg total) by mouth daily with breakfast for 4 days.   roflumilast  500 MCG Tabs tablet Commonly known as: DALIRESP  Take 0.5 tablets (250 mcg total) by mouth daily for 30 days, THEN 1 tablet (500 mcg total) daily. Start taking on: September 15, 2023 What changed: See the new instructions.   Spiriva  HandiHaler 18 MCG inhalation capsule Generic drug: tiotropium PLACE 1 CAPSULE (18 MCG TOTAL) INTO INHALER AND INHALE DAILY. What changed: when to take this   Symbicort  160-4.5 MCG/ACT inhaler Generic drug: budesonide -formoterol  Inhale 2 puffs into the lungs 2 (two) times daily.   Ventolin  HFA 108 (90 Base) MCG/ACT inhaler Generic drug: albuterol  Inhale 2 puffs into the lungs every 6 (six) hours as needed for wheezing or shortness of breath What changed: when to take this   albuterol  (2.5 MG/3ML) 0.083% nebulizer solution Commonly known as: PROVENTIL  TAKE 3 MLS (2.5 MG TOTAL) BY NEBULIZATION EVERY 6 (SIX) HOURS AS NEEDED FOR SHORTNESS OF BREATH. What changed:  when to take this additional instructions   Vitamin D  (Ergocalciferol ) 1.25 MG (50000 UNIT) Caps capsule Commonly known as: DRISDOL  Take 1 capsule (50,000 Units total) by mouth every 7 (seven) days. What changed: when to take this        Follow-up Information     Newlin, Enobong, MD Follow up in 1 week(s).   Specialty: Family Medicine Contact information: [address] [place] [address] [place] [address] [phone]                Allergies  Allergen Reactions   Lipitor [Atorvastatin ] Itching and Swelling    Facial, tongue swelling   Shellfish Allergy Anaphylaxis   Beef (Diagnostic) Other  (See Comments)    Gout flares   Colcrys  [Colchicine ] Hives    Consultations: PCCM   Procedures/Studies: CT CHEST W CONTRAST Result Date: 11/01/2023 CLINICAL DATA:  Abnormal chest x-ray, question lung mass EXAM: CT CHEST WITH CONTRAST TECHNIQUE: Multidetector CT imaging of the chest was performed during intravenous contrast administration. RADIATION DOSE REDUCTION: This exam was performed according to the departmental dose-optimization program which includes automated exposure control, adjustment of the mA and/or kV according to patient size and/or use of iterative reconstruction technique. CONTRAST:  50mL OMNIPAQUE  IOHEXOL  350 MG/ML SOLN COMPARISON:  CT 07/31/2023, 10/10/2018 FINDINGS: Cardiovascular: Mild aortic atherosclerosis. No aneurysm. Coronary vascular calcification. Normal cardiac size. No pericardial effusion. Enlarged appearing central pulmonary arteries. Heterogenous attenuation of the pulmonary arterial system. Mediastinum/Nodes: Patent trachea. No thyroid  mass. Limited assessment for hilar adenopathy due to suboptimal contrast opacification. Multiple subcentimeter mediastinal lymph nodes overall mildly decreased compared to prior CT,  at the AP window, largest lymph node measures 9 mm compared with 12 mm previously. Esophagus within normal limits. Lungs/Pleura: Emphysema. No pleural effusion or pneumothorax. Since the prior CT, regression of irregular slightly spiculated focus of density in the left upper lobe/lingula, this measures about 19 x 8 mm, previously 21 x 18 mm on series 4, image 11. Small focus of distortion slightly superior and anterior to this measures 13 x 7 mm on series 4, image 100. Within the posterior right upper lobe, there is persistent focus of spiculation, this measures 10 x 12 mm, previously 11 x 9 mm on series 4, image 68. Generally improved aeration of lower lobes compared to prior. Mild residual ground-glass disease at the right base. Small focus of subpleural  consolidation in the right lower lobe on series 4, image 135 but improved appearance compared with the prior CT. Upper Abdomen: No acute finding Musculoskeletal: No acute osseous abnormality. IMPRESSION: 1. Since the prior CT, regression of irregular slightly spiculated focus of density in the left upper lobe/lingula, favor infectious or inflammatory etiology given progression compared to the prior CT. Small focus of distortion slightly superior and anterior to this measures 13 x 7 mm, not clearly seen on prior, question post inflammatory infectious. Continued CT follow-up for these findings is recommended to ensure stability/resolution. 2. Persistent focus of nodular spiculation in the posterior right upper lobe measuring 10 x 12 mm, previously 11 x 9 mm. This remains concerning for small neoplasm and multi disciplinary thoracic consultation is recommended for this finding with consideration of possible PET CT for further assessment 3. Generally improved aeration of lower lobes compared to prior CT. Mild residual ground-glass disease at the right base. Small focus of subpleural consolidation in the right lower lobe but improved appearance compared with the prior CT. This could represent an area of developing scarring, continued attention on follow-up imaging. 4. Emphysema. 5. Aortic atherosclerosis. Aortic Atherosclerosis (ICD10-I70.0) and Emphysema (ICD10-J43.9). Electronically Signed   By: Luke Bun M.D.   On: 11/01/2023 22:38   DG Chest 2 View Result Date: 10/31/2023 CLINICAL DATA:  Shortness of breath. EXAM: CHEST - 2 VIEW COMPARISON:  09/28/2023 and CT chest 07/31/2023. FINDINGS: Trachea is midline. Heart size is at the upper limits of normal in size. Emphysema. Nodular consolidation in the suprahilar right upper lobe. Right perihilar linear scarring or atelectasis. Mild basilar interstitial prominence and indistinctness. Tiny bilateral pleural effusions. IMPRESSION: 1. Mild basilar interstitial  prominence and indistinctness with tiny bilateral pleural effusions, possibly due to congestive heart failure. A viral or atypical pneumonia cannot be excluded. 2. Nodular consolidation in the suprahilar right upper lobe, worrisome for primary bronchogenic carcinoma and better evaluated on CT chest 07/31/2023. Electronically Signed   By: Newell Eke M.D.   On: 10/31/2023 17:03     Discharge Exam: Vitals:   11/02/23 0829 11/02/23 1128  BP: (!) 130/109 (!) 144/94  Pulse: 83 86  Resp: 18 19  Temp: (!) 97.4 F (36.3 C) 98.2 F (36.8 C)  SpO2: 100% 97%   Vitals:   11/02/23 0300 11/02/23 0819 11/02/23 0829 11/02/23 1128  BP: 128/77  (!) 130/109 (!) 144/94  Pulse: 81  83 86  Resp: 18  18 19   Temp: (!) 97.5 F (36.4 C)  (!) 97.4 F (36.3 C) 98.2 F (36.8 C)  TempSrc:      SpO2: 92% 92% 100% 97%  Weight:      Height:        General: Pt is alert,  awake, not in acute distress Cardiovascular: RRR, S1/S2 +, no rubs, no gallops Respiratory: CTA bilaterally, no wheezing, no rhonchi Abdominal: Soft, NT, ND, bowel sounds + Extremities: no edema, no cyanosis    The results of significant diagnostics from this hospitalization (including imaging, microbiology, ancillary and laboratory) are listed below for reference.     Microbiology: Recent Results (from the past 240 hours)  Resp panel by RT-PCR (RSV, Flu A&B, Covid) Anterior Nasal Swab     Status: None   Collection Time: 10/31/23  4:39 PM   Specimen: Anterior Nasal Swab  Result Value Ref Range Status   SARS Coronavirus 2 by RT PCR NEGATIVE NEGATIVE Final   Influenza A by PCR NEGATIVE NEGATIVE Final   Influenza B by PCR NEGATIVE NEGATIVE Final    Comment: (NOTE) The Xpert Xpress SARS-CoV-2/FLU/RSV plus assay is intended as an aid in the diagnosis of influenza from Nasopharyngeal swab specimens and should not be used as a sole basis for treatment. Nasal washings and aspirates are unacceptable for Xpert Xpress  SARS-CoV-2/FLU/RSV testing.  Fact Sheet for Patients: BloggerCourse.com  Fact Sheet for Healthcare Providers: SeriousBroker.it  This test is not yet approved or cleared by the [place]  FDA and has been authorized for detection and/or diagnosis of SARS-CoV-2 by FDA under an Emergency Use Authorization (EUA). This EUA will remain in effect (meaning this test can be used) for the duration of the COVID-19 declaration under Section 564(b)(1) of the Act, 21 U.S.C. section 360bbb-3(b)(1), unless the authorization is terminated or revoked.     Resp Syncytial Virus by PCR NEGATIVE NEGATIVE Final    Comment: (NOTE) Fact Sheet for Patients: BloggerCourse.com  Fact Sheet for Healthcare Providers: SeriousBroker.it  This test is not yet approved or cleared by the [place]  FDA and has been authorized for detection and/or diagnosis of SARS-CoV-2 by FDA under an Emergency Use Authorization (EUA). This EUA will remain in effect (meaning this test can be used) for the duration of the COVID-19 declaration under Section 564(b)(1) of the Act, 21 U.S.C. section 360bbb-3(b)(1), unless the authorization is terminated or revoked.  Performed at [facility], [address] [place], [place] [address]   Expectorated Sputum Assessment w Gram Stain, Rflx to Resp Cult     Status: None   Collection Time: 10/31/23 11:03 PM   Specimen: Sputum  Result Value Ref Range Status   Specimen Description SPU  Final   Special Requests NONE  Final   Sputum evaluation   Final    Sputum specimen not acceptable for testing.  Please recollect.   NOTIFIED RN MARJORIE CONSOLE ON 11/01/23 @ 2018 BY DRT Performed at [facility], [address] [place], [place] [address]    Report Status 11/01/2023 FINAL  Final  Respiratory (~20 pathogens) panel by PCR     Status: None   Collection Time: 10/31/23 11:03  PM   Specimen: Nasopharyngeal Swab; Respiratory  Result Value Ref Range Status   Adenovirus NOT DETECTED NOT DETECTED Final   Coronavirus 229E NOT DETECTED NOT DETECTED Final    Comment: (NOTE) The Coronavirus on the Respiratory Panel, DOES NOT test for the novel  Coronavirus (2019 nCoV)    Coronavirus HKU1 NOT DETECTED NOT DETECTED Final   Coronavirus NL63 NOT DETECTED NOT DETECTED Final   Coronavirus OC43 NOT DETECTED NOT DETECTED Final   Metapneumovirus NOT DETECTED NOT DETECTED Final   Rhinovirus / Enterovirus NOT DETECTED NOT DETECTED Final   Influenza A NOT DETECTED NOT DETECTED  Final   Influenza B NOT DETECTED NOT DETECTED Final   Parainfluenza Virus 1 NOT DETECTED NOT DETECTED Final   Parainfluenza Virus 2 NOT DETECTED NOT DETECTED Final   Parainfluenza Virus 3 NOT DETECTED NOT DETECTED Final   Parainfluenza Virus 4 NOT DETECTED NOT DETECTED Final   Respiratory Syncytial Virus NOT DETECTED NOT DETECTED Final   Bordetella pertussis NOT DETECTED NOT DETECTED Final   Bordetella Parapertussis NOT DETECTED NOT DETECTED Final   Chlamydophila pneumoniae NOT DETECTED NOT DETECTED Final   Mycoplasma pneumoniae NOT DETECTED NOT DETECTED Final    Comment: Performed at [facility], [address], [place], [place] [address]     Labs: BNP (last 3 results) Recent Labs    08/30/23 1132 09/09/23 0733 10/31/23 1815  BNP 24.4 41.8 39.2   Basic Metabolic Panel: Recent Labs  Lab 10/31/23 1815 11/01/23 0351  NA 137 137  K 3.7 3.8  CL 99 97*  CO2 25 26  GLUCOSE 116* 147*  BUN 6* 9  CREATININE 1.07 1.19  CALCIUM  9.6 9.8  MG  --  1.9  PHOS  --  3.3   Liver Function Tests: Recent Labs  Lab 10/31/23 1943  AST 16  ALT 13  ALKPHOS 83  BILITOT 0.9  PROT 7.9  ALBUMIN 3.5   No results for input(s): LIPASE, AMYLASE in the last 168 hours. No results for input(s): AMMONIA in the last 168 hours. CBC: Recent Labs  Lab 10/31/23 1815 11/01/23 0351 11/02/23 0157   WBC 12.8* 12.0* 22.7*  NEUTROABS 11.3*  --  19.3*  HGB 13.7 13.9 13.7  HCT 45.0 45.6 43.5  MCV 76.9* 76.8* 75.8*  PLT 275 273 220   Cardiac Enzymes: No results for input(s): CKTOTAL, CKMB, CKMBINDEX, TROPONINI in the last 168 hours. BNP: Invalid input(s): POCBNP CBG: No results for input(s): GLUCAP in the last 168 hours. D-Dimer No results for input(s): DDIMER in the last 72 hours. Hgb A1c No results for input(s): HGBA1C in the last 72 hours. Lipid Profile No results for input(s): CHOL, HDL, LDLCALC, TRIG, CHOLHDL, LDLDIRECT in the last 72 hours. Thyroid  function studies No results for input(s): TSH, T4TOTAL, T3FREE, THYROIDAB in the last 72 hours.  Invalid input(s): FREET3 Anemia work up No results for input(s): VITAMINB12, FOLATE, FERRITIN, TIBC, IRON , RETICCTPCT in the last 72 hours. Urinalysis    Component Value Date/Time   COLORURINE YELLOW 10/30/2018 0730   APPEARANCEUR CLEAR 10/30/2018 0730   LABSPEC 1.013 10/30/2018 0730   PHURINE 6.0 10/30/2018 0730   GLUCOSEU NEGATIVE 10/30/2018 0730   HGBUR NEGATIVE 10/30/2018 0730   BILIRUBINUR NEGATIVE 10/30/2018 0730   KETONESUR NEGATIVE 10/30/2018 0730   PROTEINUR 30 (A) 10/30/2018 0730   UROBILINOGEN 0.2 12/11/2013 0951   NITRITE NEGATIVE 10/30/2018 0730   LEUKOCYTESUR NEGATIVE 10/30/2018 0730   Sepsis Labs Recent Labs  Lab 10/31/23 1815 11/01/23 0351 11/02/23 0157  WBC 12.8* 12.0* 22.7*   Microbiology Recent Results (from the past 240 hours)  Resp panel by RT-PCR (RSV, Flu A&B, Covid) Anterior Nasal Swab     Status: None   Collection Time: 10/31/23  4:39 PM   Specimen: Anterior Nasal Swab  Result Value Ref Range Status   SARS Coronavirus 2 by RT PCR NEGATIVE NEGATIVE Final   Influenza A by PCR NEGATIVE NEGATIVE Final   Influenza B by PCR NEGATIVE NEGATIVE Final    Comment: (NOTE) The Xpert Xpress SARS-CoV-2/FLU/RSV plus assay is intended as an aid in the  diagnosis of influenza from  Nasopharyngeal swab specimens and should not be used as a sole basis for treatment. Nasal washings and aspirates are unacceptable for Xpert Xpress SARS-CoV-2/FLU/RSV testing.  Fact Sheet for Patients: BloggerCourse.com  Fact Sheet for Healthcare Providers: SeriousBroker.it  This test is not yet approved or cleared by the [place]  FDA and has been authorized for detection and/or diagnosis of SARS-CoV-2 by FDA under an Emergency Use Authorization (EUA). This EUA will remain in effect (meaning this test can be used) for the duration of the COVID-19 declaration under Section 564(b)(1) of the Act, 21 U.S.C. section 360bbb-3(b)(1), unless the authorization is terminated or revoked.     Resp Syncytial Virus by PCR NEGATIVE NEGATIVE Final    Comment: (NOTE) Fact Sheet for Patients: BloggerCourse.com  Fact Sheet for Healthcare Providers: SeriousBroker.it  This test is not yet approved or cleared by the [place]  FDA and has been authorized for detection and/or diagnosis of SARS-CoV-2 by FDA under an Emergency Use Authorization (EUA). This EUA will remain in effect (meaning this test can be used) for the duration of the COVID-19 declaration under Section 564(b)(1) of the Act, 21 U.S.C. section 360bbb-3(b)(1), unless the authorization is terminated or revoked.  Performed at [facility], [address] [place], [place] [address]   Expectorated Sputum Assessment w Gram Stain, Rflx to Resp Cult     Status: None   Collection Time: 10/31/23 11:03 PM   Specimen: Sputum  Result Value Ref Range Status   Specimen Description SPU  Final   Special Requests NONE  Final   Sputum evaluation   Final    Sputum specimen not acceptable for testing.  Please recollect.   NOTIFIED RN MARJORIE CONSOLE ON 11/01/23 @ 2018 BY DRT Performed at [facility],  [address] [place], [place] [address]    Report Status 11/01/2023 FINAL  Final  Respiratory (~20 pathogens) panel by PCR     Status: None   Collection Time: 10/31/23 11:03 PM   Specimen: Nasopharyngeal Swab; Respiratory  Result Value Ref Range Status   Adenovirus NOT DETECTED NOT DETECTED Final   Coronavirus 229E NOT DETECTED NOT DETECTED Final    Comment: (NOTE) The Coronavirus on the Respiratory Panel, DOES NOT test for the novel  Coronavirus (2019 nCoV)    Coronavirus HKU1 NOT DETECTED NOT DETECTED Final   Coronavirus NL63 NOT DETECTED NOT DETECTED Final   Coronavirus OC43 NOT DETECTED NOT DETECTED Final   Metapneumovirus NOT DETECTED NOT DETECTED Final   Rhinovirus / Enterovirus NOT DETECTED NOT DETECTED Final   Influenza A NOT DETECTED NOT DETECTED Final   Influenza B NOT DETECTED NOT DETECTED Final   Parainfluenza Virus 1 NOT DETECTED NOT DETECTED Final   Parainfluenza Virus 2 NOT DETECTED NOT DETECTED Final   Parainfluenza Virus 3 NOT DETECTED NOT DETECTED Final   Parainfluenza Virus 4 NOT DETECTED NOT DETECTED Final   Respiratory Syncytial Virus NOT DETECTED NOT DETECTED Final   Bordetella pertussis NOT DETECTED NOT DETECTED Final   Bordetella Parapertussis NOT DETECTED NOT DETECTED Final   Chlamydophila pneumoniae NOT DETECTED NOT DETECTED Final   Mycoplasma pneumoniae NOT DETECTED NOT DETECTED Final    Comment: Performed at [facility], [address], [place], [place] [address]    FURTHER DISCHARGE INSTRUCTIONS:   Get Medicines reviewed and adjusted: Please take all your medications with you for your next visit with your Primary MD   Laboratory/radiological data: Please request your Primary MD to go over all hospital tests  and procedure/radiological results at the follow up, please ask your Primary MD to get all Hospital records sent to his/her office.   In some cases, they will be blood work, cultures and biopsy results pending at the time of your discharge.  Please request that your primary care M.D. goes through all the records of your hospital data and follows up on these results.   Also Note the following: If you experience worsening of your admission symptoms, develop shortness of breath, life threatening emergency, suicidal or homicidal thoughts you must seek medical attention immediately by calling 911 or calling your MD immediately  if symptoms less severe.   You must read complete instructions/literature along with all the possible adverse reactions/side effects for all the Medicines you take and that have been prescribed to you. Take any new Medicines after you have completely understood and accpet all the possible adverse reactions/side effects.    patient was instructed, not to drive, operate heavy machinery, perform activities at heights, swimming or participation in water activities or provide baby-sitting services while on Pain, Sleep and Anxiety Medications; until their outpatient Physician has advised to do so again. Also recommended to not to take more than prescribed Pain, Sleep and Anxiety Medications.  It is not advisable to combine anxiety, sleep and pain medications without talking with your primary care provider.     Wear Seat belts while driving.   Please note: You were cared for by a hospitalist during your hospital stay. Once you are discharged, your primary care physician will handle any further medical issues. Please note that NO REFILLS for any discharge medications will be authorized once you are discharged, as it is imperative that you return to your primary care physician (or establish a relationship with a primary care physician if you do not have one) for your post hospital discharge needs so that they can reassess your need for medications and monitor your lab values  Time coordinating discharge: Over 30 minutes  SIGNED:   Fredia Skeeter, MD  [facility] 11/02/2023, 12:59 PM *Please note that this is a verbal  dictation therefore any spelling or grammatical errors are due to the Dragon Medical One system interpretation. If 7PM-7AM, please contact night-coverage [email or web address]

## 2023-11-05 ENCOUNTER — Telehealth: Payer: Self-pay | Admitting: Pulmonary Disease

## 2023-11-05 ENCOUNTER — Telehealth: Payer: Self-pay | Admitting: *Deleted

## 2023-11-05 DIAGNOSIS — R911 Solitary pulmonary nodule: Secondary | ICD-10-CM

## 2023-11-05 NOTE — Telephone Encounter (Signed)
 Can we schedule this PET scan for October so we can get his follow up scheduled with Dr.Mannam

## 2023-11-05 NOTE — Telephone Encounter (Signed)
 Please cancel his scheduled visit with me on 11/09/2023 Order a PET scan for lung nodule in 3 months and arrange clinic visit either in person or virtual with me in 3 months after PET scan.

## 2023-11-05 NOTE — Transitions of Care (Post Inpatient/ED Visit) (Signed)
   11/05/2023  Name: [name] [name]. MRN: [id number] DOB: May 06, 1960  Today's TOC FU Call Status: Today's TOC FU Call Status:: Unsuccessful Call (1st Attempt) Unsuccessful Call (1st Attempt) Date: 11/05/23  Attempted to reach the patient regarding the most recent Inpatient/ED visit.  Follow Up Plan: Additional outreach attempts will be made to reach the patient to complete the Transitions of Care (Post Inpatient/ED visit) call.   Andrea Dimes RN, BSN [place]  Value-Based Care Institute [facility] Health RN Care Manager [phone]

## 2023-11-05 NOTE — Transitions of Care (Post Inpatient/ED Visit) (Addendum)
 11/05/2023  Name: Rondale Nies. MRN: [id number] DOB: [birth year]  Today's TOC FU Call Status: Today's TOC FU Call Status:: Successful TOC FU Call Completed TOC FU Call Complete Date: 11/05/23 Patient's Name and Date of Birth confirmed.  Transition Care Management Follow-up Telephone Call Date of Discharge: 11/02/23 Discharge Facility: [facility]) Type of Discharge: Inpatient Admission Primary Inpatient Discharge Diagnosis:: COPD exacerbation How have you been since you were released from the hospital?: Better Any questions or concerns?: No  Items Reviewed: Did you receive and understand the discharge instructions provided?: Yes Medications obtained,verified, and reconciled?: Yes (Medications Reviewed) Any new allergies since your discharge?: No Dietary orders reviewed?: Yes Type of Diet Ordered:: Heart Healthy Low Sodium diet Do you have support at home?: Yes People in Home [RPT]: child(ren), adult Name of Support/Comfort Primary Source: two adult daughters  Medications Reviewed Today: Medications Reviewed Today     Reviewed by Lucky Andrea LABOR, RN (Registered Nurse) on 11/05/23 at 1543  Med List Status: <None>   Medication Order Taking? Sig Documenting Provider Last Dose Status Informant  albuterol  (PROVENTIL ) (2.5 MG/3ML) 0.083% nebulizer solution [id number] Yes TAKE 3 MLS (2.5 MG TOTAL) BY NEBULIZATION EVERY 6 (SIX) HOURS AS NEEDED FOR SHORTNESS OF BREATH. Newlin, Enobong, MD  Active Self, Pharmacy Records  albuterol  (VENTOLIN  HFA) 108 [phone] Base) MCG/ACT inhaler [id number] Yes Inhale 2 puffs into the lungs every 6 (six) hours as needed for wheezing or shortness of breath Danton Jon HERO, PA-C  Active Self, Pharmacy Records    Discontinued 07/03/11 1455 (Entry Error)   allopurinol  (ZYLOPRIM ) 100 MG tablet [id number] Yes Take 1 tablet (100 mg total) by mouth daily. Newlin, Enobong, MD  Active Self, Pharmacy Records  amLODipine  (NORVASC ) 10 MG tablet [id number] Yes Take 1  tablet (10 mg total) by mouth daily. Newlin, Enobong, MD  Active Self, Pharmacy Records  budesonide -formoterol  (SYMBICORT ) 160-4.5 MCG/ACT inhaler [id number] Yes Inhale 2 puffs into the lungs 2 (two) times daily. Mannam, Praveen, MD  Active Self, Pharmacy Records  doxycycline  (VIBRA -TABS) 100 MG tablet [id number] Yes Take 1 tablet (100 mg total) by mouth every 12 (twelve) hours for 4 days. Vernon Ranks, MD  Active   ezetimibe  (ZETIA ) 10 MG tablet [id number] Yes Take 1 tablet (10 mg total) by mouth daily. Danton Jon HERO, PA-C  Active Self, Pharmacy Records  ibuprofen  (ADVIL ) 200 MG tablet [id number] Yes Take 800 mg by mouth daily as needed for moderate pain (pain score 4-6) or headache. [provider]  Active Self, Pharmacy Records  metoprolol  succinate (TOPROL -XL) 50 MG 24 hr tablet [id number] Yes Take 1 tablet (50 mg total) by mouth daily. Newlin, Enobong, MD  Active Self, Pharmacy Records  montelukast  (SINGULAIR ) 10 MG tablet [id number] Yes Take 1 tablet (10 mg total) by mouth at bedtime. Newlin, Enobong, MD  Active Self, Pharmacy Records  predniSONE  (DELTASONE ) 50 MG tablet [id number] Yes Take 1 tablet (50 mg total) by mouth daily with breakfast for 4 days. Vernon Ranks, MD  Active   roflumilast  (DALIRESP ) 500 MCG TABS tablet [id number] Yes Take 0.5 tablets (250 mcg total) by mouth daily for 30 days, THEN 1 tablet (500 mcg total) daily. Leotis Bogus, MD  Active Self, Pharmacy Records  tiotropium (SPIRIVA  HANDIHALER) 18 MCG inhalation capsule [id number] Yes PLACE 1 CAPSULE (18 MCG TOTAL) INTO INHALER AND INHALE DAILY. Newlin, Enobong, MD  Active Self, Pharmacy Records  Vitamin D , Ergocalciferol , (DRISDOL ) 1.25 MG (50000 UNIT) CAPS capsule [id number] Yes Take 1 capsule (50,000 Units total) by mouth  every 7 (seven) days. Leotis Bogus, MD  Active Self, Pharmacy Records            Home Care and Equipment/Supplies: Were Home Health Services Ordered?: No Any new equipment or medical  supplies ordered?: No  Functional Questionnaire: Do you need assistance with bathing/showering or dressing?: No Do you need assistance with meal preparation?: Yes Do you need assistance with eating?: No Do you have difficulty maintaining continence: No Do you need assistance with getting out of bed/getting out of a chair/moving?: No Do you have difficulty managing or taking your medications?: No  Follow up appointments reviewed: PCP Follow-up appointment confirmed?: No (Patient will call to schedule once he has his daughters schedule. She provides transportation.) MD Provider Line Number:[phone] Given: No Specialist Hospital Follow-up appointment confirmed?: Yes Date of Specialist follow-up appointment?: 11/09/23 Follow-Up Specialty Provider:: Pulmonology Do you need transportation to your follow-up appointment?: No Do you understand care options if your condition(s) worsen?: Yes-patient verbalized understanding    Goals Addressed             This Visit's Progress    VBCI Transitions of Care (TOC) Care Plan       Problems:  Recent Hospitalization for treatment of COPD Functional/Safety concern: Oxygen  Concentrator issues  Goal:  Over the next 30 days, the patient will not experience hospital readmission  Interventions:  Transitions of Care: Doctor Visits  - discussed the importance of doctor visits Medication review, discussed the importance of taking antibiotics completely Reviewed upcoming appointments, advised patient to schedule a hospital follow up visit with PCP   Patient Self Care Activities:  Attend all scheduled provider appointments Call provider office for new concerns or questions  Participate in Transition of Care Program/Attend TOC scheduled calls Take medications as prescribed    Plan:  Telephone follow up appointment with care management team member scheduled for:  11/09/23 at 2:30pm       During telephone outreach, Mr. Sagen's phone battery  was getting very low and he had to end the call early. RNCM unable to complete assessment today. RNCM will follow up on 11/09/23 at 2:30pm.  Andrea Dimes RN, BSN [place]  Value-Based Care Institute [facility] Health RN Care Manager [phone]

## 2023-11-06 NOTE — Telephone Encounter (Signed)
 PET scheduled for 10/9.  Dr. Jiles schedule is not open past 10/9. Patient is aware that a recall has been placed.

## 2023-11-09 ENCOUNTER — Inpatient Hospital Stay: Admitting: Pulmonary Disease

## 2023-11-09 ENCOUNTER — Other Ambulatory Visit: Payer: Self-pay | Admitting: *Deleted

## 2023-11-09 NOTE — Transitions of Care (Post Inpatient/ED Visit) (Signed)
 Transition of Care week 1, follow up  Visit Note  11/09/2023  Name: [name] [name]. MRN: [id number]          DOB: 02/28/1961  Situation: Patient enrolled in [facility] 30-day program. Visit completed with Mr. Hounshell by telephone.   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 Patient feels he only has breathing problems when his equipment messes up. He is now checking O2 Sat every hour and will switch to a tank if the concentrator starts acting up and call [employer]. Respiratory Self-Management Outcome: 4 (good)  Endocrine Endocrine Symptoms Reported: No symptoms reported    Gastrointestinal Gastrointestinal Symptoms Reported: No symptoms reported      Genitourinary Genitourinary Symptoms Reported: No symptoms reported    Integumentary Integumentary Symptoms Reported: No symptoms reported    Musculoskeletal Musculoskelatal Symptoms Reviewed: Limited mobility Musculoskeletal Management Strategies: Activity, Exercise, Routine screening, Medical device Musculoskeletal Self-Management Outcome: 4 (good) Musculoskeletal Comment: ambulates with walker Falls in the past year?: No    Psychosocial Psychosocial Symptoms Reported: No symptoms reported     Quality of Family Relationships: helpful, involved, supportive   Vitals:    11/09/23 1434  SpO2: 94%    Medications Reviewed Today     Reviewed by Lucky Andrea LABOR, RN (Registered Nurse) on 11/09/23 at 1439  Med List Status: <None>   Medication Order Taking? Sig Documenting Provider Last Dose Status Informant  albuterol  (PROVENTIL ) (2.5 MG/3ML) 0.083% nebulizer solution [id number]  TAKE 3 MLS (2.5 MG TOTAL) BY NEBULIZATION EVERY 6 (SIX) HOURS AS NEEDED FOR SHORTNESS OF BREATH. Newlin, Enobong, MD  Active Self, Pharmacy Records  albuterol  (VENTOLIN  HFA) 108 [phone] Base) MCG/ACT inhaler [id number]  Inhale 2 puffs into the lungs every 6 (six) hours as needed for wheezing or shortness of breath Danton Jon HERO, PA-C  Active Self, Pharmacy Records    Discontinued 07/03/11 1455 (Entry Error)   allopurinol  (ZYLOPRIM ) 100 MG tablet [id number]  Take 1 tablet (100 mg total) by mouth daily. Newlin, Enobong, MD  Active Self, Pharmacy Records  amLODipine  (NORVASC ) 10 MG tablet [id number]  Take 1 tablet (10 mg total) by mouth daily. Newlin, Enobong, MD  Active Self, Pharmacy Records  budesonide -formoterol  (SYMBICORT ) 160-4.5 MCG/ACT inhaler [id number]  Inhale 2 puffs into the lungs 2 (two) times daily. Mannam, Praveen, MD  Active Self, Pharmacy Records  ezetimibe  (ZETIA ) 10 MG tablet [id number]  Take 1 tablet (10 mg total) by mouth daily. Danton Jon HERO, PA-C  Active Self, Pharmacy Records  ibuprofen  (ADVIL ) 200 MG tablet [id number]  Take 800 mg by mouth daily as needed for moderate pain (pain score 4-6) or headache. [provider]  Active Self, Pharmacy Records  metoprolol  succinate (TOPROL -XL) 50 MG 24 hr tablet [id number]  Take 1 tablet (50 mg total) by mouth daily. Newlin, Enobong, MD  Active Self, Pharmacy Records  montelukast  (  SINGULAIR ) 10 MG tablet [id number]  Take 1 tablet (10 mg total) by mouth at bedtime. Newlin, Enobong, MD  Active Self, Pharmacy Records  roflumilast  (DALIRESP ) 500 MCG TABS tablet [id number]  Take 0.5 tablets (250 mcg total) by mouth daily for 30 days,  THEN 1 tablet (500 mcg total) daily. Leotis Bogus, MD  Active Self, Pharmacy Records  tiotropium (SPIRIVA  HANDIHALER) 18 MCG inhalation capsule [id number]  PLACE 1 CAPSULE (18 MCG TOTAL) INTO INHALER AND INHALE DAILY. Newlin, Enobong, MD  Active Self, Pharmacy Records  Vitamin D , Ergocalciferol , (DRISDOL ) 1.25 MG (50000 UNIT) CAPS capsule [id number]  Take 1 capsule (50,000 Units total) by mouth every 7 (seven) days. Leotis Bogus, MD  Active Self, Pharmacy Records            Recommendation:   Continue Current Plan of Care  Follow Up Plan:   Telephone follow-up in 1 week  Andrea Dimes RN, BSN   Value-Based Care Institute [facility] Health RN Care Manager [phone]

## 2023-11-09 NOTE — Patient Instructions (Signed)
 Visit Information  Thank you for taking time to visit with me today. Please don't hesitate to contact me if I can be of assistance to you before our next scheduled telephone appointment.   Following is a copy of your care plan:   Goals Addressed             This Visit's Progress    VBCI Transitions of Care (TOC) Care Plan       Problems:  Recent Hospitalization for treatment of COPD Functional/Safety concern: Oxygen  Concentrator issues  Goal:  Over the next 30 days, the patient will not experience hospital readmission  Interventions:  Transitions of Care: Durable Medical Equipment (DME) performed Doctor Visits  - discussed the importance of doctor visits Post discharge activity limitations prescribed by provider reviewed Reviewed upcoming appointments including PCP hospital follow up on 11/14/23 Reviewed plan to avoid equipment issues-patient will check O2 sat every hour, if saturation is decreasing, patient will switch to oxygen  tank and contact [employer]   Patient Self Care Activities:  Attend all scheduled provider appointments Call provider office for new concerns or questions  Participate in Transition of Care Program/Attend TOC scheduled calls Take medications as prescribed    Plan:  Telephone follow up appointment with care management team member scheduled for:  11/16/23 at 1:30pm        Patient verbalizes understanding of instructions and care plan provided today and agrees to view in MyChart. Active MyChart status and patient understanding of how to access instructions and care plan via MyChart confirmed with patient.     Telephone follow up appointment with care management team member scheduled for:11/16/23 at 1:30pm  Please call the care guide team at [phone] if you need to cancel or reschedule your appointment.   Please call 1-800-273-TALK (toll free, 24 hour hotline) go to [facility] Urgent [facility] [address] [place]  [phone]) call 911 if you are experiencing a Mental Health or Behavioral Health Crisis or need someone to talk to.  Andrea Dimes RN, BSN [place]  Value-Based Care Institute [facility] Health RN Care Manager [phone]

## 2023-11-12 ENCOUNTER — Other Ambulatory Visit: Payer: Self-pay

## 2023-11-13 ENCOUNTER — Other Ambulatory Visit: Payer: Self-pay

## 2023-11-13 ENCOUNTER — Telehealth: Payer: Self-pay | Admitting: Family Medicine

## 2023-11-13 NOTE — Telephone Encounter (Signed)
 Pt confirmed appt 8/5

## 2023-11-14 ENCOUNTER — Inpatient Hospital Stay: Admitting: Family Medicine

## 2023-11-14 DIAGNOSIS — U071 COVID-19: Secondary | ICD-10-CM | POA: Diagnosis not present

## 2023-11-14 DIAGNOSIS — J449 Chronic obstructive pulmonary disease, unspecified: Secondary | ICD-10-CM | POA: Diagnosis not present

## 2023-11-16 ENCOUNTER — Other Ambulatory Visit: Payer: Self-pay | Admitting: *Deleted

## 2023-11-16 NOTE — Transitions of Care (Post Inpatient/ED Visit) (Signed)
 Transition of Care week 2  Visit Note  11/16/2023  Name: [name] [name]. MRN: [id number]          DOB: [birth year]  Situation: Patient enrolled in [facility] 30-day program. Visit completed with Mr. Ishida by telephone.   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   COPD (chronic obstructive pulmonary disease) (HCC)    on 4L home O2   Essential hypertension    Gout    Thrombocytopenia (HCC) 07/28/2012    Assessment: Patient Reported Symptoms: Cognitive Cognitive Status: Able to follow simple commands, Alert and oriented to person, place, and time, Normal speech and language skills, Insightful and able to interpret abstract concepts      Neurological Neurological Review of Symptoms: No symptoms reported    HEENT HEENT Symptoms Reported: Not assessed      Cardiovascular Cardiovascular Symptoms Reported: No symptoms reported    Respiratory Additional Respiratory Details: Patient reports feeling good. Pulse Ox usually 90-94% at rest, drops to high 80's with walking. Oxygen  10L continuous Respiratory Management Strategies: Oxygen  therapy, CPAP, Routine screening, Medication therapy Respiratory Self-Management Outcome: 4 (good)  Endocrine Endocrine Symptoms Reported: Not assessed    Gastrointestinal Gastrointestinal Symptoms Reported: No symptoms reported Additional Gastrointestinal Details: loose stools earlier this week, had to cancel the PCP office visit. Patient will call to reschedule with PCP. Denies any GI issues today. Gastrointestinal Management Strategies: Adequate rest Gastrointestinal Self-Management Outcome: 4 (good)    Genitourinary Genitourinary Symptoms Reported: Not assessed    Integumentary Integumentary Symptoms Reported: Not assessed    Musculoskeletal   Musculoskeletal Management Strategies: Routine screening, Coping  strategies, Adequate rest, Medical device Musculoskeletal Self-Management Outcome: 4 (good) Musculoskeletal Comment: ambulates with walker, does exercises at home      Psychosocial Psychosocial Symptoms Reported: Not assessed         There were no vitals filed for this visit.  Medications Reviewed Today     Reviewed by Lucky Andrea LABOR, RN (Registered Nurse) on 11/16/23 at 1342  Med List Status: <None>   Medication Order Taking? Sig Documenting Provider Last Dose Status Informant  albuterol  (PROVENTIL ) (2.5 MG/3ML) 0.083% nebulizer solution [id number] Yes TAKE 3 MLS (2.5 MG TOTAL) BY NEBULIZATION EVERY 6 (SIX) HOURS AS NEEDED FOR SHORTNESS OF BREATH. Newlin, Enobong, MD  Active Self, Pharmacy Records  albuterol  (VENTOLIN  HFA) 108 [phone] Base) MCG/ACT inhaler [id number] Yes Inhale 2 puffs into the lungs every 6 (six) hours as needed for wheezing or shortness of breath Danton Jon HERO, PA-C  Active Self, Pharmacy Records    Discontinued 07/03/11 1455 (Entry Error)   allopurinol  (ZYLOPRIM ) 100 MG tablet [id number] Yes Take 1 tablet (100 mg total) by mouth daily. Newlin, Enobong, MD  Active Self, Pharmacy Records  amLODipine  (NORVASC ) 10 MG tablet [id number] Yes Take 1 tablet (10 mg total) by mouth daily. Newlin, Enobong, MD  Active Self, Pharmacy Records  budesonide -formoterol  (SYMBICORT ) 160-4.5 MCG/ACT inhaler [id number] Yes Inhale 2 puffs into the lungs 2 (two) times daily. Mannam, Praveen, MD  Active Self, Pharmacy Records  ezetimibe  (ZETIA ) 10 MG tablet [id number] Yes Take 1 tablet (10 mg total) by mouth daily. Danton Jon HERO, PA-C  Active Self, Pharmacy Records  ibuprofen  (ADVIL ) 200 MG tablet [id number] Yes Take 800 mg by mouth daily as needed for moderate pain (pain score 4-6) or headache. [provider]  Active Self, Pharmacy  Records  metoprolol  succinate (TOPROL -XL) 50 MG 24 hr tablet [id number] Yes Take 1 tablet (50 mg total) by mouth daily. Newlin, Enobong, MD  Active Self,  Pharmacy Records  montelukast  (SINGULAIR ) 10 MG tablet [id number] Yes Take 1 tablet (10 mg total) by mouth at bedtime. Newlin, Enobong, MD  Active Self, Pharmacy Records  OXYGEN  [id number] Yes Inhale 10 L into the lungs continuous. [provider]  Active   roflumilast  (DALIRESP ) 500 MCG TABS tablet [id number] Yes Take 0.5 tablets (250 mcg total) by mouth daily for 30 days, THEN 1 tablet (500 mcg total) daily. Leotis Bogus, MD  Active Self, Pharmacy Records  tiotropium (SPIRIVA  HANDIHALER) 18 MCG inhalation capsule [id number] Yes PLACE 1 CAPSULE (18 MCG TOTAL) INTO INHALER AND INHALE DAILY. Newlin, Enobong, MD  Active Self, Pharmacy Records  Vitamin D , Ergocalciferol , (DRISDOL ) 1.25 MG (50000 UNIT) CAPS capsule [id number] Yes Take 1 capsule (50,000 Units total) by mouth every 7 (seven) days. Leotis Bogus, MD  Active Self, Pharmacy Records            Recommendation:   Continue Current Plan of Care  Follow Up Plan:   Telephone follow-up in 1 week  Andrea Dimes RN, BSN [place]  Value-Based Care Institute [facility] Health RN Care Manager [phone]

## 2023-11-16 NOTE — Patient Instructions (Signed)
 Visit Information  Thank you for taking time to visit with me today. Please don't hesitate to contact me if I can be of assistance to you before our next scheduled telephone appointment.   Following is a copy of your care plan:   Goals Addressed             This Visit's Progress    VBCI Transitions of Care (TOC) Care Plan       Problems:  Recent Hospitalization for treatment of COPD Functional/Safety concern: Oxygen  Concentrator issues  Goal:  Over the next 30 days, the patient will not experience hospital readmission  Interventions:  Transitions of Care: Durable Medical Equipment (DME) performed Doctor Visits  - discussed the importance of doctor visits Post discharge activity limitations prescribed by provider reviewed Discussed patient cancelled PCP visit on 11/14/23, he is waiting on his daughters work schedule and will call to reschedule with PCP Reviewed plan to avoid equipment issues-patient will check O2 sat every hour, if saturation is decreasing, patient will switch to oxygen  tank and contact [employer]-patient denies any recent issues   Patient Self Care Activities:  Attend all scheduled provider appointments Call provider office for new concerns or questions  Participate in Transition of Care Program/Attend TOC scheduled calls Take medications as prescribed    Plan:  Telephone follow up appointment with care management team member scheduled for:  11/27/23 at 1:30pm        Patient verbalizes understanding of instructions and care plan provided today and agrees to view in MyChart. Active MyChart status and patient understanding of how to access instructions and care plan via MyChart confirmed with patient.     Telephone follow up appointment with care management team member scheduled for:11/27/23 at 3pm  Please call the care guide team at [phone] if you need to cancel or reschedule your appointment.   Please call 1-800-273-TALK (toll free, 24 hour hotline) go  to [facility] Urgent [facility] [address] [place] [phone]) call 911 if you are experiencing a Mental Health or Behavioral Health Crisis or need someone to talk to.  Andrea Dimes RN, BSN [place]  Value-Based Care Institute [facility] Health RN Care Manager [phone]

## 2023-11-26 ENCOUNTER — Other Ambulatory Visit: Payer: Self-pay | Admitting: Family Medicine

## 2023-11-26 ENCOUNTER — Other Ambulatory Visit: Payer: Self-pay

## 2023-11-26 ENCOUNTER — Other Ambulatory Visit: Payer: Self-pay | Admitting: Physician Assistant

## 2023-11-26 DIAGNOSIS — E7800 Pure hypercholesterolemia, unspecified: Secondary | ICD-10-CM

## 2023-11-27 ENCOUNTER — Telehealth: Payer: Self-pay

## 2023-11-27 ENCOUNTER — Other Ambulatory Visit: Payer: Self-pay | Admitting: Family Medicine

## 2023-11-27 ENCOUNTER — Other Ambulatory Visit: Payer: Self-pay

## 2023-11-27 ENCOUNTER — Other Ambulatory Visit: Payer: Self-pay | Admitting: *Deleted

## 2023-11-27 DIAGNOSIS — J449 Chronic obstructive pulmonary disease, unspecified: Secondary | ICD-10-CM

## 2023-11-27 DIAGNOSIS — E7800 Pure hypercholesterolemia, unspecified: Secondary | ICD-10-CM

## 2023-11-27 MED ORDER — ALBUTEROL SULFATE (2.5 MG/3ML) 0.083% IN NEBU
2.5000 mg | INHALATION_SOLUTION | Freq: Four times a day (QID) | RESPIRATORY_TRACT | 0 refills | Status: DC | PRN
  Filled 2023-11-27: qty 360, 30d supply, fill #0

## 2023-11-27 MED ORDER — EZETIMIBE 10 MG PO TABS
10.0000 mg | ORAL_TABLET | Freq: Every day | ORAL | 1 refills | Status: AC
  Filled 2023-11-27: qty 90, 90d supply, fill #0
  Filled 2024-02-18: qty 90, 90d supply, fill #1

## 2023-11-27 NOTE — Patient Instructions (Signed)
 Visit Information  Thank you for taking time to visit with me today. Please don't hesitate to contact me if I can be of assistance to you before our next scheduled telephone appointment.   Following is a copy of your care plan:   Goals Addressed             This Visit's Progress    VBCI Transitions of Care (TOC) Care Plan       Problems:  Recent Hospitalization for treatment of COPD Functional/Safety concern: Oxygen  Concentrator issues-resolved  Goal:  Over the next 30 days, the patient will not experience hospital readmission  Interventions:  Transitions of Care: Doctor Visits  - discussed the importance of doctor visits Post discharge activity limitations prescribed by provider reviewed Discussed patient cancelled PCP visit on 11/14/23, he is waiting on his daughters work schedule and will call to reschedule with PCP-rescheduled to 12/26/23 Reviewed plan to avoid equipment issues-patient will check O2 sat every hour, if saturation is decreasing, patient will switch to oxygen  tank and contact [employer]-patient denies any recent issues Medication review   Patient Self Care Activities:  Attend all scheduled provider appointments Call provider office for new concerns or questions  Participate in Transition of Care Program/Attend TOC scheduled calls Take medications as prescribed    Plan:  Telephone follow up appointment with care management team member scheduled for:  12/04/23 at 3:15pm        Patient verbalizes understanding of instructions and care plan provided today and agrees to view in MyChart. Active MyChart status and patient understanding of how to access instructions and care plan via MyChart confirmed with patient.     Telephone follow up appointment with care management team member scheduled for:12/04/23 at 3:15 pm  Please call the care guide team at [phone] if you need to cancel or reschedule your appointment.   Please call 1-800-273-TALK (toll free, 24 hour  hotline) go to [facility] Urgent [facility] [address], [place] [phone]) call 911 if you are experiencing a Mental Health or Behavioral Health Crisis or need someone to talk to.  Andrea Dimes RN, BSN [place]  Value-Based Care Institute [facility] Health RN Care Manager [phone]

## 2023-11-27 NOTE — Telephone Encounter (Signed)
 I sent Zetia  to his pharmacy. Daliresp  needs to be prescribed by his Pulmonologist

## 2023-11-27 NOTE — Addendum Note (Signed)
 Addended by: Tija Biss on: 11/27/2023 05:30 PM   Modules accepted: Orders

## 2023-11-27 NOTE — Telephone Encounter (Signed)
 Routing to PCP for review.

## 2023-11-27 NOTE — Telephone Encounter (Signed)
Have you seen a PA for this patient 

## 2023-11-27 NOTE — Transitions of Care (Post Inpatient/ED Visit) (Signed)
 Transition of Care week 3  Visit Note  11/27/2023  Name: [name] [name]. MRN: [id number]          DOB: 1961-03-13  Situation: Patient enrolled in [facility] 30-day program. Visit completed with [name] [name] by telephone.   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   COPD (chronic obstructive pulmonary disease) (HCC)    on 4L home O2   Essential hypertension    Gout    Thrombocytopenia (HCC) 07/28/2012    Assessment: Patient Reported Symptoms: Cognitive Cognitive Status: Able to follow simple commands, Normal speech and language skills, Alert and oriented to person, place, and time      Neurological Neurological Review of Symptoms: No symptoms reported    HEENT HEENT Symptoms Reported: No symptoms reported      Cardiovascular Cardiovascular Symptoms Reported: No symptoms reported    Respiratory Respiratory Symptoms Reported: Shortness of breath Other Respiratory Symptoms: SOB at times with ambulation. Continues to use 10L Oxygen  all of the time. Respiratory Self-Management Outcome: 4 (good)  Endocrine Endocrine Symptoms Reported: No symptoms reported    Gastrointestinal Gastrointestinal Symptoms Reported: No symptoms reported      Genitourinary Genitourinary Symptoms Reported: No symptoms reported    Integumentary Integumentary Symptoms Reported: Not assessed    Musculoskeletal Musculoskelatal Symptoms Reviewed: Weakness Musculoskeletal Management Strategies: Medical device, Routine screening, Coping strategies Musculoskeletal Self-Management Outcome: 4 (good) Musculoskeletal Comment: ambulates with walker or cane, exercising at home      Psychosocial Psychosocial Symptoms Reported: Not assessed         There were no vitals filed for this visit.  Medications Reviewed Today     Reviewed by Lucky Andrea LABOR, RN (Registered  Nurse) on 11/27/23 at 1515  Med List Status: <None>   Medication Order Taking? Sig Documenting Provider Last Dose Status Informant  albuterol  (PROVENTIL ) (2.5 MG/3ML) 0.083% nebulizer solution [id number] Yes TAKE 3 MLS (2.5 MG TOTAL) BY NEBULIZATION EVERY 6 (SIX) HOURS AS NEEDED FOR SHORTNESS OF BREATH. Newlin, Enobong, MD  Active   albuterol  (VENTOLIN  HFA) 108 (90 Base) MCG/ACT inhaler [id number] Yes Inhale 2 puffs into the lungs every 6 (six) hours as needed for wheezing or shortness of breath Danton Jon HERO, PA-C  Active Self, Pharmacy Records    Discontinued 07/03/11 1455 (Entry Error)   allopurinol  (ZYLOPRIM ) 100 MG tablet [id number] Yes Take 1 tablet (100 mg total) by mouth daily. Newlin, Enobong, MD  Active Self, Pharmacy Records  amLODipine  (NORVASC ) 10 MG tablet [id number] Yes Take 1 tablet (10 mg total) by mouth daily. Newlin, Enobong, MD  Active Self, Pharmacy Records  budesonide -formoterol  (SYMBICORT ) 160-4.5 MCG/ACT inhaler [id number] Yes Inhale 2 puffs into the lungs 2 (two) times daily. Mannam, Praveen, MD  Active Self, Pharmacy Records  ezetimibe  (ZETIA ) 10 MG tablet [id number] Yes Take 1 tablet (10 mg total) by mouth daily. Danton Jon HERO, PA-C  Active Self, Pharmacy Records  ibuprofen  (ADVIL ) 200 MG tablet [id number] Yes Take 800 mg by mouth daily as needed for moderate pain (pain score 4-6) or headache. [provider]  Active Self, Pharmacy Records  metoprolol  succinate (TOPROL -XL) 50 MG 24 hr tablet [id number] Yes Take 1 tablet (50 mg total) by mouth daily. Newlin, Enobong, MD  Active Self, Pharmacy Records  montelukast  (SINGULAIR ) 10 MG tablet [id number] Yes Take 1 tablet (10 mg total) by mouth at bedtime. Newlin, Enobong,  MD  Active Self, Pharmacy Records  OXYGEN  [id number] Yes Inhale 10 L into the lungs continuous. [provider]  Active   roflumilast  (DALIRESP ) 500 MCG TABS tablet [id number] Yes Take 0.5 tablets (250 mcg total) by mouth daily for 30 days, THEN 1  tablet (500 mcg total) daily. Leotis Bogus, MD  Active Self, Pharmacy Records  tiotropium (SPIRIVA  HANDIHALER) 18 MCG inhalation capsule [id number] Yes PLACE 1 CAPSULE (18 MCG TOTAL) INTO INHALER AND INHALE DAILY. Newlin, Enobong, MD  Active Self, Pharmacy Records  Vitamin D , Ergocalciferol , (DRISDOL ) 1.25 MG (50000 UNIT) CAPS capsule [id number] Yes Take 1 capsule (50,000 Units total) by mouth every 7 (seven) days. Leotis Bogus, MD  Active Self, Pharmacy Records            Recommendation:   Continue Current Plan of Care  Follow Up Plan:   Telephone follow-up in 1 week  Andrea Dimes RN, BSN [place]  [facility] Health RN Care Manager [phone]

## 2023-11-27 NOTE — Telephone Encounter (Signed)
 Copied from CRM #[id number]. Topic: Clinical - Prescription Issue >> Nov 27, 2023  9:37 AM Emylou G wrote: Reason for CRM: Patient called.. adv ZETIA  and DALIRESP  was denied?  Can anything be done?  He does have a future appt?

## 2023-11-28 ENCOUNTER — Other Ambulatory Visit: Payer: Self-pay

## 2023-11-28 ENCOUNTER — Other Ambulatory Visit: Payer: Self-pay | Admitting: Pulmonary Disease

## 2023-11-28 ENCOUNTER — Other Ambulatory Visit: Payer: Self-pay | Admitting: Family Medicine

## 2023-11-28 NOTE — Telephone Encounter (Signed)
 Patient has been informed.

## 2023-11-28 NOTE — Telephone Encounter (Signed)
 Copied from CRM #[id number]. Topic: Clinical - Medication Refill >> Nov 28, 2023 11:35 AM [name] wrote: Medication: roflumilast  (DALIRESP ) 500 MCG TABS tablet  Has the patient contacted their pharmacy? Yes, pharmacy advised patient to contact Dr. Theophilus.  (Agent: If no, request that the patient contact the pharmacy for the refill. If patient does not wish to contact the pharmacy document the reason why and proceed with request.) (Agent: If yes, when and what did the pharmacy advise?)  This is the patient's preferred pharmacy:  [facility] [address], [address] [place] [address] Phone: [phone] Fax: [phone]    Is this the correct pharmacy for this prescription? Yes If no, delete pharmacy and type the correct one.   Has the prescription been filled recently? Yes  Is the patient out of the medication? Yes  Has the patient been seen for an appointment in the last year OR does the patient have an upcoming appointment? Yes  Can we respond through MyChart? No  Agent: Please be advised that Rx refills may take up to 3 business days. We ask that you follow-up with your pharmacy.

## 2023-11-29 ENCOUNTER — Other Ambulatory Visit: Payer: Self-pay | Admitting: Family Medicine

## 2023-11-29 ENCOUNTER — Other Ambulatory Visit: Payer: Self-pay

## 2023-11-30 ENCOUNTER — Other Ambulatory Visit: Payer: Self-pay | Admitting: Pulmonary Disease

## 2023-11-30 ENCOUNTER — Ambulatory Visit: Payer: Self-pay | Admitting: Pulmonary Disease

## 2023-11-30 ENCOUNTER — Other Ambulatory Visit: Payer: Self-pay

## 2023-11-30 ENCOUNTER — Other Ambulatory Visit: Payer: Self-pay | Admitting: Family Medicine

## 2023-11-30 MED FILL — Roflumilast Tab 500 MCG: ORAL | 60 days supply | Qty: 45 | Fill #0 | Status: CN

## 2023-11-30 NOTE — Telephone Encounter (Signed)
 Pt was In the hospital in June, and this was prescribed then.  Pt has not seen the dr since then.  Pt has not had this med in 6-7 days.  Not sure if he is having an side effects.

## 2023-11-30 NOTE — Telephone Encounter (Signed)
 FYI Only or Action Required?: Action required by provider: has been trying to get medication refilled for the last seven days..  Patient is followed in Pulmonology for COPD, chronic respiratory failure, last seen on 06/13/2023 by Malachy Comer GAILS, NP.  Called Nurse Triage reporting Medication Problem.  No current symptoms-trying to figure out medication refill for roflumilast  (Daliresp )   Triage Disposition: Call PCP Now  Patient/caregiver understands and will follow disposition?: Yes Reason for Disposition  [1] Caller has URGENT medicine question about med that primary care doctor (or NP/PA) or specialist prescribed AND [2] triager unable to answer question  Answer Assessment - Initial Assessment Questions 1. NAME of MEDICINE: What medicine(s) are you calling about?     Daliresp  2. QUESTION: What is your question? (e.g., double dose of medicine, side effect)     Patient has been off the medication for about seven days. Prescription was written by hospitalist MD when he was discharged from the hospital. Patient was told by PCP that he needed to speak with pulmonary about any refills. Patient has been off the medication for seven days.  3. PRESCRIBER: Who prescribed the medicine? Reason: if prescribed by specialist, call should be referred to that group.     Initially prescribed by hospitalist MD from when he was in the hospital.  4. SYMPTOMS: Do you have any symptoms? If Yes, ask: What symptoms are you having?  How bad are the symptoms (e.g., mild, moderate, severe)     No other symptoms  Patient is unsure if he is suppose to continue to take medication. Patient has been trying to get it filled. Patient is needing a follow up call from the office with recommendations.  Protocols used: Medication Question Call-A-AH

## 2023-11-30 NOTE — Telephone Encounter (Signed)
 Dr. Theophilus, please advise. Daliresp  is not mentioned in the last two OV notes.

## 2023-12-03 ENCOUNTER — Other Ambulatory Visit: Payer: Self-pay

## 2023-12-03 ENCOUNTER — Telehealth: Payer: Self-pay | Admitting: *Deleted

## 2023-12-03 NOTE — Transitions of Care (Post Inpatient/ED Visit) (Signed)
 Transition of Care week 4  Visit Note  12/03/2023  Name: [name] [name]. MRN: [id number]          DOB: 05/06/1960  Situation: Patient enrolled in [facility] 30-day program. Visit completed with [name] [name] by telephone.   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   COPD (chronic obstructive pulmonary disease) (HCC)    on 4L home O2   Essential hypertension    Gout    Thrombocytopenia (HCC) 07/28/2012    Assessment: Patient Reported Symptoms: Cognitive Cognitive Status: Able to follow simple commands, Alert and oriented to person, place, and time, Normal speech and language skills      Neurological Neurological Review of Symptoms: No symptoms reported    HEENT HEENT Symptoms Reported: Not assessed      Cardiovascular Cardiovascular Symptoms Reported: No symptoms reported    Respiratory Respiratory Symptoms Reported: No symptoms reported Additional Respiratory Details: Patient reports feeling good. Pulse Ox is 92%. Ran out of Daliresp , prescription has been called in. Patient's daughter will pick up tomorrow. Respiratory Management Strategies: CPAP, Breathing exercise, Medication therapy, Oxygen  therapy, Routine screening, Coping strategies Respiratory Self-Management Outcome: 4 (good)  Endocrine Endocrine Symptoms Reported: Not assessed    Gastrointestinal Gastrointestinal Symptoms Reported: Not assessed      Genitourinary Genitourinary Symptoms Reported: No symptoms reported    Integumentary Integumentary Symptoms Reported: Not assessed    Musculoskeletal Musculoskelatal Symptoms Reviewed: Weakness Musculoskeletal Management Strategies: Coping strategies, Exercise, Routine screening Musculoskeletal Self-Management Outcome: 4 (good) Musculoskeletal Comment: ambulates with walker or cane, exercising at home      Psychosocial  Psychosocial Symptoms Reported: Not assessed         Vitals:   12/03/23 1650  SpO2: 92%    Medications Reviewed Today     Reviewed by Lucky Andrea LABOR, RN (Registered Nurse) on 12/03/23 at 1641  Med List Status: <None>   Medication Order Taking? Sig Documenting Provider Last Dose Status Informant  albuterol  (PROVENTIL ) (2.5 MG/3ML) 0.083% nebulizer solution [id number] Yes TAKE 3 MLS (2.5 MG TOTAL) BY NEBULIZATION EVERY 6 (SIX) HOURS AS NEEDED FOR SHORTNESS OF BREATH. Newlin, Enobong, MD  Active   albuterol  (VENTOLIN  HFA) 108 (90 Base) MCG/ACT inhaler [id number] Yes Inhale 2 puffs into the lungs every 6 (six) hours as needed for wheezing or shortness of breath Danton Jon HERO, PA-C  Active Self, Pharmacy Records    Discontinued 07/03/11 1455 (Entry Error)   allopurinol  (ZYLOPRIM ) 100 MG tablet [id number] Yes Take 1 tablet (100 mg total) by mouth daily. Newlin, Enobong, MD  Active Self, Pharmacy Records  amLODipine  (NORVASC ) 10 MG tablet [id number] Yes Take 1 tablet (10 mg total) by mouth daily. Newlin, Enobong, MD  Active Self, Pharmacy Records  budesonide -formoterol  (SYMBICORT ) 160-4.5 MCG/ACT inhaler [id number] Yes Inhale 2 puffs into the lungs 2 (two) times daily. Mannam, Praveen, MD  Active Self, Pharmacy Records  ezetimibe  (ZETIA ) 10 MG tablet [id number] Yes Take 1 tablet (10 mg total) by mouth daily. Newlin, Enobong, MD  Active   ibuprofen  (ADVIL ) 200 MG tablet [id number] Yes Take 800 mg by mouth daily as needed for moderate pain (pain score 4-6) or headache. [provider]  Active Self, Pharmacy Records  metoprolol  succinate (TOPROL -XL) 50 MG 24 hr tablet [id number] Yes Take 1 tablet (50 mg total) by mouth daily. Newlin, Enobong, MD  Active Self, Pharmacy Records  montelukast  (  SINGULAIR ) 10 MG tablet [id number] Yes Take 1 tablet (10 mg total) by mouth at bedtime. Newlin, Enobong, MD  Active Self, Pharmacy Records  OXYGEN  [id number] Yes Inhale 10 L into the lungs continuous. [provider]  Active   roflumilast  (DALIRESP ) 500 MCG TABS tablet [id number]  Take 0.5 tablets (250 mcg total) by mouth daily for 30 days, THEN 1 tablet (500 mcg total) daily.  Patient not taking: No sig reported   Mannam, Praveen, MD  Active   tiotropium (SPIRIVA  HANDIHALER) 18 MCG inhalation capsule [id number] Yes PLACE 1 CAPSULE (18 MCG TOTAL) INTO INHALER AND INHALE DAILY. Newlin, Enobong, MD  Active Self, Pharmacy Records  Vitamin D , Ergocalciferol , (DRISDOL ) 1.25 MG (50000 UNIT) CAPS capsule [id number] Yes Take 1 capsule (50,000 Units total) by mouth every 7 (seven) days. Leotis Bogus, MD  Active Self, Pharmacy Records            Recommendation:   Continue Current Plan of Care  Follow Up Plan:   Telephone follow-up in 1 week  Andrea Dimes RN, BSN [place]  [facility] Health RN Care Manager [phone]

## 2023-12-03 NOTE — Patient Instructions (Signed)
 Visit Information  Thank you for taking time to visit with me today. Please don't hesitate to contact me if I can be of assistance to you before our next scheduled telephone appointment.   Following is a copy of your care plan:   Goals Addressed             This Visit's Progress    VBCI Transitions of Care (TOC) Care Plan       Problems:  Recent Hospitalization for treatment of COPD Functional/Safety concern: Oxygen  Concentrator issues-resolved  Goal:  Over the next 30 days, the patient will not experience hospital readmission  Interventions:  Transitions of Care: Doctor Visits  - discussed the importance of doctor visits Post discharge activity limitations prescribed by provider reviewed Discussed patient cancelled PCP visit on 11/14/23, he is waiting on his daughters work schedule and will call to reschedule with PCP-rescheduled to 12/26/23 Reviewed plan to avoid equipment issues-patient will check O2 sat every hour, if saturation is decreasing, patient will switch to oxygen  tank and contact [employer]-patient denies any recent issues Medication review-discussed refill for Daliresp  has been sent, patient's daughter will pick up tomorrow   Patient Self Care Activities:  Attend all scheduled provider appointments Call provider office for new concerns or questions  Participate in Transition of Care Program/Attend TOC scheduled calls Take medications as prescribed    Plan:  Telephone follow up appointment with care management team member scheduled for:  12/12/23 at 2:45pm        Patient verbalizes understanding of instructions and care plan provided today and agrees to view in MyChart. Active MyChart status and patient understanding of how to access instructions and care plan via MyChart confirmed with patient.     Telephone follow up appointment with care management team member scheduled for:12/12/23 at 2:45pm  Please call the care guide team at [phone] if you need to cancel or  reschedule your appointment.   Please call [phone]-TALK (toll free, 24 hour hotline) go to [facility] Urgent [facility] [address], [place] [phone]) call 911 if you are experiencing a Mental Health or Behavioral Health Crisis or need someone to talk to.  Andrea Dimes RN, BSN [place]  Value-Based Care Institute [facility] Health RN Care Manager [phone]

## 2023-12-04 ENCOUNTER — Telehealth: Admitting: *Deleted

## 2023-12-04 ENCOUNTER — Other Ambulatory Visit: Payer: Self-pay

## 2023-12-04 ENCOUNTER — Other Ambulatory Visit: Payer: Self-pay | Admitting: Family Medicine

## 2023-12-05 ENCOUNTER — Other Ambulatory Visit: Payer: Self-pay

## 2023-12-05 MED ORDER — VITAMIN D (ERGOCALCIFEROL) 1.25 MG (50000 UNIT) PO CAPS
50000.0000 [IU] | ORAL_CAPSULE | ORAL | 0 refills | Status: AC
  Filled 2023-12-05: qty 12, 84d supply, fill #0

## 2023-12-05 NOTE — Telephone Encounter (Signed)
 Requested medication (s) are due for refill today: Yes  Requested medication (s) are on the active medication list: Yes  Last refill:  09/14/23  Future visit scheduled: Yes  Notes to clinic:  See request.    Requested Prescriptions  Pending Prescriptions Disp Refills   Vitamin D , Ergocalciferol , (DRISDOL ) 1.25 MG (50000 UNIT) CAPS capsule 12 capsule 0    Sig: Take 1 capsule (50,000 Units total) by mouth every 7 (seven) days.     Endocrinology:  Vitamins - Vitamin D  Supplementation 2 Failed - 12/05/2023  3:12 PM      Failed - Manual Review: Route requests for 50,000 IU strength to the provider      Failed - Vitamin D  in normal range and within 360 days    Vit D, 25-Hydroxy  Date Value Ref Range Status  09/10/2023 11.75 (L) 30 - 100 ng/mL Final    Comment:    (NOTE) Vitamin D  deficiency has been defined by the Institute of Medicine  and an Endocrine Society practice guideline as a level of serum 25-OH  vitamin D  less than 20 ng/mL (1,2). The Endocrine Society went on to  further define vitamin D  insufficiency as a level between 21 and 29  ng/mL (2).  1. IOM (Institute of Medicine). 2010. Dietary reference intakes for  calcium  and D. [place]: [employer]. 2. Holick MF, Binkley [place], Bischoff-Ferrari HA, et al. Evaluation,  treatment, and prevention of vitamin D  deficiency: an Endocrine  Society clinical practice guideline, JCEM. 2011 Jul; 96(7): 1911-30.  Performed at [facility], [address], [place], [place] [address]          Passed - Ca in normal range and within 360 days    Calcium   Date Value Ref Range Status  11/01/2023 9.8 8.9 - 10.3 mg/dL Final   Calcium , Ion  Date Value Ref Range Status  09/09/2023 1.18 1.15 - 1.40 mmol/L Final         Passed - Valid encounter within last 12 months    Recent Outpatient Visits           1 month ago COPD without exacerbation (HCC)   [facility] [place]. [facility] Delbert Clam, MD   4 months ago Hospital discharge follow-up   [facility] [place] [place]. [facility] [place], Jon HERO, [place]   1 year ago Hospital discharge follow-up   [facility] Celestia Rosaline SQUIBB, NP   1 year ago Screening for diabetes mellitus   [facility] Of [place]. [facility] Delbert Clam, MD   2 years ago Chronic respiratory failure with hypoxia [facility])   [place] [facility] [place] [facility] [place]. [facility] Delbert Clam, MD

## 2023-12-07 ENCOUNTER — Other Ambulatory Visit: Payer: Self-pay

## 2023-12-07 NOTE — Telephone Encounter (Signed)
 Called and spoke with the patient. Advised Daliresp  has been sent to pharmacy. Pt verbalized understanding.   Offered to schedule pt PET follow up appt and pt stated he would need to do that later that he has to use the bathroom right now.  I advised pt we would call back this afternoon to get PET f/u appt with Dr. Theophilus scheduled.   PET scan is scheduled for 10/9, front staff can you please schedule patient appt follow up after PET is completed.  Thank you.

## 2023-12-07 NOTE — Telephone Encounter (Signed)
 Prescription for deliresp has been sent in, Please make follow up appointment with me after PET scan is completed in Oct 2025

## 2023-12-11 ENCOUNTER — Emergency Department (HOSPITAL_COMMUNITY)

## 2023-12-11 ENCOUNTER — Encounter (HOSPITAL_COMMUNITY): Payer: Self-pay | Admitting: Internal Medicine

## 2023-12-11 ENCOUNTER — Inpatient Hospital Stay (HOSPITAL_COMMUNITY)
Admission: EM | Admit: 2023-12-11 | Discharge: 2023-12-18 | DRG: 189 | Disposition: A | Discharge status: Home / Self Care | Attending: Hospitalist | Admitting: Hospitalist

## 2023-12-11 ENCOUNTER — Other Ambulatory Visit: Payer: Self-pay

## 2023-12-11 DIAGNOSIS — Z66 Do not resuscitate: Secondary | ICD-10-CM | POA: Diagnosis not present

## 2023-12-11 DIAGNOSIS — D72829 Elevated white blood cell count, unspecified: Secondary | ICD-10-CM | POA: Diagnosis present

## 2023-12-11 DIAGNOSIS — M109 Gout, unspecified: Secondary | ICD-10-CM | POA: Diagnosis not present

## 2023-12-11 DIAGNOSIS — J441 Chronic obstructive pulmonary disease with (acute) exacerbation: Secondary | ICD-10-CM | POA: Diagnosis not present

## 2023-12-11 DIAGNOSIS — Z1152 Encounter for screening for COVID-19: Secondary | ICD-10-CM

## 2023-12-11 DIAGNOSIS — I129 Hypertensive chronic kidney disease with stage 1 through stage 4 chronic kidney disease, or unspecified chronic kidney disease: Secondary | ICD-10-CM | POA: Diagnosis present

## 2023-12-11 DIAGNOSIS — Z91013 Allergy to seafood: Secondary | ICD-10-CM | POA: Diagnosis not present

## 2023-12-11 DIAGNOSIS — Z87891 Personal history of nicotine dependence: Secondary | ICD-10-CM | POA: Diagnosis not present

## 2023-12-11 DIAGNOSIS — T380X5A Adverse effect of glucocorticoids and synthetic analogues, initial encounter: Secondary | ICD-10-CM | POA: Diagnosis not present

## 2023-12-11 DIAGNOSIS — J440 Chronic obstructive pulmonary disease with acute lower respiratory infection: Secondary | ICD-10-CM | POA: Diagnosis present

## 2023-12-11 DIAGNOSIS — E785 Hyperlipidemia, unspecified: Secondary | ICD-10-CM | POA: Diagnosis present

## 2023-12-11 DIAGNOSIS — J9621 Acute and chronic respiratory failure with hypoxia: Secondary | ICD-10-CM | POA: Diagnosis not present

## 2023-12-11 DIAGNOSIS — Z888 Allergy status to other drugs, medicaments and biological substances status: Secondary | ICD-10-CM

## 2023-12-11 DIAGNOSIS — Z635 Disruption of family by separation and divorce: Secondary | ICD-10-CM | POA: Diagnosis not present

## 2023-12-11 DIAGNOSIS — J9601 Acute respiratory failure with hypoxia: Secondary | ICD-10-CM | POA: Diagnosis not present

## 2023-12-11 DIAGNOSIS — N182 Chronic kidney disease, stage 2 (mild): Secondary | ICD-10-CM | POA: Diagnosis not present

## 2023-12-11 DIAGNOSIS — Z87892 Personal history of anaphylaxis: Secondary | ICD-10-CM | POA: Diagnosis not present

## 2023-12-11 DIAGNOSIS — R0602 Shortness of breath: Secondary | ICD-10-CM | POA: Diagnosis not present

## 2023-12-11 DIAGNOSIS — R0609 Other forms of dyspnea: Secondary | ICD-10-CM | POA: Diagnosis not present

## 2023-12-11 LAB — CBC
HCT: 47.3 % (ref 39.0–52.0)
Hemoglobin: 14.4 g/dL (ref 13.0–17.0)
MCH: 23.3 pg — ABNORMAL LOW (ref 26.0–34.0)
MCHC: 30.4 g/dL (ref 30.0–36.0)
MCV: 76.4 fL — ABNORMAL LOW (ref 80.0–100.0)
Platelets: 328 K/uL (ref 150–400)
RBC: 6.19 MIL/uL — ABNORMAL HIGH (ref 4.22–5.81)
RDW: 13.6 % (ref 11.5–15.5)
WBC: 15 K/uL — ABNORMAL HIGH (ref 4.0–10.5)
nRBC: 0 % (ref 0.0–0.2)

## 2023-12-11 LAB — BASIC METABOLIC PANEL WITH GFR
Anion gap: 12 (ref 5–15)
BUN: 8 mg/dL (ref 8–23)
CO2: 27 mmol/L (ref 22–32)
Calcium: 9.8 mg/dL (ref 8.9–10.3)
Chloride: 96 mmol/L — ABNORMAL LOW (ref 98–111)
Creatinine, Ser: 1.11 mg/dL (ref 0.61–1.24)
GFR, Estimated: >60 mL/min (ref >60)
Glucose, Bld: 97 mg/dL (ref 70–99)
Potassium: 3.7 mmol/L (ref 3.5–5.1)
Sodium: 135 mmol/L (ref 135–145)

## 2023-12-11 LAB — MRSA NEXT GEN BY PCR, NASAL: MRSA by PCR Next Gen: NOT DETECTED

## 2023-12-11 LAB — RESP PANEL BY RT-PCR (RSV, FLU A&B, COVID)  RVPGX2
Influenza A by PCR: NEGATIVE
Influenza B by PCR: NEGATIVE
Resp Syncytial Virus by PCR: NEGATIVE
SARS Coronavirus 2 by RT PCR: NEGATIVE

## 2023-12-11 LAB — BRAIN NATRIURETIC PEPTIDE: B Natriuretic Peptide: 51.3 pg/mL (ref 0.0–100.0)

## 2023-12-11 LAB — TROPONIN I (HIGH SENSITIVITY)
Troponin I (High Sensitivity): 7 ng/L (ref <18)
Troponin I (High Sensitivity): 6 ng/L (ref <18)

## 2023-12-11 MED ORDER — ONDANSETRON HCL 4 MG/2ML IJ SOLN: 4.0000 mg | Freq: Four times a day (QID) | INTRAMUSCULAR | Status: DC | PRN

## 2023-12-11 MED ORDER — GUAIFENESIN-DM 100-10 MG/5ML PO SYRP
5.0000 mL | ORAL_SOLUTION | ORAL | Status: DC | PRN
  Administered 2023-12-12 – 2023-12-15 (×4): 5 mL via ORAL
  Filled 2023-12-11 (×4): qty 5

## 2023-12-11 MED ORDER — ACETAMINOPHEN 500 MG PO TABS: 1000.0000 mg | ORAL_TABLET | Freq: Four times a day (QID) | ORAL | Status: DC | PRN

## 2023-12-11 MED ORDER — FLUTICASONE FUROATE-VILANTEROL 100-25 MCG/ACT IN AEPB
1.0000 | INHALATION_SPRAY | Freq: Every day | RESPIRATORY_TRACT | Status: DC
  Administered 2023-12-11 – 2023-12-17 (×7): 1 via RESPIRATORY_TRACT
  Filled 2023-12-11: qty 28

## 2023-12-11 MED ORDER — MELATONIN 3 MG PO TABS
6.0000 mg | ORAL_TABLET | Freq: Every evening | ORAL | Status: DC | PRN
  Administered 2023-12-11 – 2023-12-17 (×6): 6 mg via ORAL
  Filled 2023-12-11 (×6): qty 2

## 2023-12-11 MED ORDER — POLYETHYLENE GLYCOL 3350 17 G PO PACK: 17.0000 g | PACK | Freq: Every day | ORAL | Status: DC | PRN

## 2023-12-11 MED ORDER — LOPERAMIDE HCL 2 MG PO CAPS
2.0000 mg | ORAL_CAPSULE | ORAL | Status: AC | PRN
Start: 2023-12-11 — End: 2023-12-13
  Administered 2023-12-11 – 2023-12-13 (×2): 2 mg via ORAL
  Filled 2023-12-11 (×2): qty 1

## 2023-12-11 MED ORDER — IPRATROPIUM-ALBUTEROL 0.5-2.5 (3) MG/3ML IN SOLN
3.0000 mL | Freq: Four times a day (QID) | RESPIRATORY_TRACT | Status: DC
  Administered 2023-12-11 – 2023-12-18 (×29): 3 mL via RESPIRATORY_TRACT
  Filled 2023-12-11 (×29): qty 3

## 2023-12-11 MED ORDER — METHYLPREDNISOLONE SODIUM SUCC 125 MG IJ SOLR
60.0000 mg | Freq: Two times a day (BID) | INTRAMUSCULAR | Status: DC
  Administered 2023-12-11 – 2023-12-13 (×5): 60 mg via INTRAVENOUS
  Filled 2023-12-11 (×5): qty 2

## 2023-12-11 MED ORDER — SODIUM CHLORIDE 0.9% FLUSH
3.0000 mL | Freq: Two times a day (BID) | INTRAVENOUS | Status: DC
  Administered 2023-12-11 – 2023-12-18 (×14): 3 mL via INTRAVENOUS

## 2023-12-11 MED ORDER — AMLODIPINE BESYLATE 10 MG PO TABS
10.0000 mg | ORAL_TABLET | Freq: Every day | ORAL | Status: DC
  Administered 2023-12-11 – 2023-12-18 (×8): 10 mg via ORAL
  Filled 2023-12-11 (×8): qty 1

## 2023-12-11 MED ORDER — ENOXAPARIN SODIUM 40 MG/0.4ML IJ SOSY
40.0000 mg | PREFILLED_SYRINGE | INTRAMUSCULAR | Status: DC
  Administered 2023-12-11 – 2023-12-18 (×8): 40 mg via SUBCUTANEOUS
  Filled 2023-12-11 (×8): qty 0.4

## 2023-12-11 MED ORDER — MONTELUKAST SODIUM 10 MG PO TABS
10.0000 mg | ORAL_TABLET | Freq: Every day | ORAL | Status: DC
  Administered 2023-12-11 – 2023-12-17 (×7): 10 mg via ORAL
  Filled 2023-12-11 (×7): qty 1

## 2023-12-11 MED ORDER — ROFLUMILAST 500 MCG PO TABS
500.0000 ug | ORAL_TABLET | Freq: Every day | ORAL | Status: DC
  Administered 2023-12-11 – 2023-12-18 (×8): 500 ug via ORAL
  Filled 2023-12-11 (×8): qty 1

## 2023-12-11 MED ORDER — METOPROLOL SUCCINATE ER 50 MG PO TB24
50.0000 mg | ORAL_TABLET | Freq: Every day | ORAL | Status: DC
  Administered 2023-12-11 – 2023-12-18 (×8): 50 mg via ORAL
  Filled 2023-12-11 (×8): qty 1

## 2023-12-11 MED ORDER — IPRATROPIUM-ALBUTEROL 0.5-2.5 (3) MG/3ML IN SOLN
3.0000 mL | Freq: Once | RESPIRATORY_TRACT | Status: AC
  Administered 2023-12-11: 3 mL via RESPIRATORY_TRACT
  Filled 2023-12-11: qty 3

## 2023-12-11 MED ORDER — FLUTICASONE FUROATE-VILANTEROL 100-25 MCG/ACT IN AEPB
1.0000 | INHALATION_SPRAY | Freq: Every day | RESPIRATORY_TRACT | Status: DC
  Filled 2023-12-11: qty 28

## 2023-12-11 MED ORDER — SODIUM CHLORIDE 0.9 % IV SOLN
2.0000 g | Freq: Three times a day (TID) | INTRAVENOUS | Status: DC
  Administered 2023-12-11 – 2023-12-18 (×22): 2 g via INTRAVENOUS
  Filled 2023-12-11 (×22): qty 12.5

## 2023-12-11 MED ORDER — ALLOPURINOL 100 MG PO TABS
100.0000 mg | ORAL_TABLET | Freq: Every day | ORAL | Status: DC
  Administered 2023-12-11 – 2023-12-18 (×8): 100 mg via ORAL
  Filled 2023-12-11 (×9): qty 1

## 2023-12-11 MED ORDER — ALBUTEROL SULFATE (2.5 MG/3ML) 0.083% IN NEBU
2.5000 mg | INHALATION_SOLUTION | RESPIRATORY_TRACT | Status: DC | PRN
  Administered 2023-12-11: 2.5 mg via RESPIRATORY_TRACT
  Filled 2023-12-11 (×3): qty 3

## 2023-12-11 MED ORDER — EZETIMIBE 10 MG PO TABS
10.0000 mg | ORAL_TABLET | Freq: Every day | ORAL | Status: DC
  Administered 2023-12-11 – 2023-12-18 (×8): 10 mg via ORAL
  Filled 2023-12-11 (×8): qty 1

## 2023-12-11 NOTE — Progress Notes (Signed)
   12/11/23 2032  BiPAP/CPAP/SIPAP  BiPAP/CPAP/SIPAP Pt Type Adult  BiPAP/CPAP/SIPAP SERVO  Mask Type Full face mask  Dentures removed? Not applicable  Mask Size Large  Respiratory Rate 36 breaths/min  Pressure Support 5 cmH20  PEEP 5 cmH20  FiO2 (%) 50 %  Minute Ventilation 17.3  Leak 53  Peak Inspiratory Pressure (PIP) 10  Tidal Volume (Vt) 857  Patient Home Machine No  Patient Home Mask No  Patient Home Tubing No  Auto Titrate No  Press High Alarm 20 cmH2O  Nasal massage performed Yes  CPAP/SIPAP surface wiped down Yes  Device Plugged into RED Power Outlet Yes

## 2023-12-11 NOTE — Plan of Care (Signed)
  Problem: Education: Goal: Knowledge of General Education information will improve Description: Including pain rating scale, medication(s)/side effects and non-pharmacologic comfort measures Outcome: Progressing   Problem: Clinical Measurements: Goal: Will remain free from infection Outcome: Progressing   Problem: Nutrition: Goal: Adequate nutrition will be maintained Outcome: Progressing   

## 2023-12-11 NOTE — TOC CM/SW Note (Addendum)
 Transition of Care [facility]) - Inpatient Brief Assessment   Patient Details  Name: Gregory Young. MRN: [id number] Date of Birth: [birth year]  Transition of [facility]) CM/SW Contact:    Lauraine FORBES Saa, LCSWA Phone Number: 12/11/2023, 3:06 PM   Clinical Narrative:  3:06 PM Per chart review, patient resides at home with child(ren). Patient has a PCP and insurance. Patient does not have SNF history. Patient has HH history with [facility]. Patient has DME (Wheelchair, wheelchair cushion, BSC, oxygen , CPAP, NIV, rolling walker, cane) history with [employer] and [employer]. Patient does not have a preferred pharmacy. Patient declined CSW offer of SDOH (transportation) resources and stated that daughter provides patient transportation to appointments. TOC consult was placed for medication assistance. TOC will continue to follow and be available to assist.  Transition of Care Asessment: Insurance and Status: Insurance coverage has been reviewed Patient has primary care physician: Yes Home environment has been reviewed: Private Residence Prior level of function:: N/A Prior/Current Home Services: No current home services Social Drivers of Health Review: SDOH reviewed no interventions necessary Readmission risk has been reviewed: Yes (Currently Green 9%) Transition of care needs: transition of care needs identified, TOC will continue to follow

## 2023-12-11 NOTE — Care Plan (Signed)
 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 Feels a little congested. Denies fever / chills. He reports his grandson has been using strong cologne,  thinks this has been causing SOB. He has access to home inhalers / Nebulizers and using as Rx'd. He was recently out of Roflumilast  x 7 days.  Patient was admitted for acute on chronic hypoxic respiratory failure likely due to COPD exacerbation started on scheduled and as needed nebulized bronchodilators and IV Solu-Medrol .  Patient was seen and examined at bedside.  Reports feeling improved.

## 2023-12-11 NOTE — H&P (Signed)
 History and Physical    Gregory Young. FMW:[id number] DOB: 09/07/1960 DOA: 12/11/2023  PCP: Delbert Clam, MD   Patient coming from: Home   Chief Complaint:  Chief Complaint  Patient presents with   Shortness of Breath    HPI:  [name]. is a 63 y.o. 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 Denies significant cough / sputum production but typically not able to produce any. Feels a little congested. Denies fever / chills. Says his grandson has been wearing strong cologne thinks this has been affecting him. He has access to home inhalers / nebulizers and using as Rx'd. He was recently out of Roflumilast  x 7 days, his daughter is going to pick this med up tomorrow so he will have at home on discharge. He is not smoking.    Review of Systems:  ROS complete and negative except as marked above   Not on File  Med Hx / Meds / Shx reviewed; see alternate MRN   Physical Exam: Vitals:   12/11/23 0200 12/11/23 0209 12/11/23 0225 12/11/23 0245  BP:    119/78  Pulse:  (!) 103  94  Resp:  (!) 27  (!) 22  Temp: (!) 97.4 F (36.3 C)     TempSrc: Temporal     SpO2:  93% 94% 94%    Gen: Awake, alert, improving resp distress  CV: Regular, normal S1, S2, no murmurs  Resp: Improving resp distress, remains tachypneic, very poor air movement. No wheezes in this setting  Abd: Flat, normoactive, nontender MSK: Symmetric, no edema  Skin: No rashes or lesions to exposed skin  Neuro: Alert and interactive  Psych: euthymic, appropriate    Data review:   Labs reviewed, notable for:   Chemistries unremarkable BNP 51 High-sensitivity Trop 7 WBC 15  Micro:  No results found for this or any previous visit.  Imaging reviewed:  DG  Chest Port 1 View Result Date: 12/11/2023 CLINICAL DATA:  Shortness of breath EXAM: PORTABLE CHEST 1 VIEW COMPARISON:  10/31/2023 FINDINGS: Cardiac shadow is stable. Lungs are hyperinflated with emphysematous change. Chronic blunting of the costophrenic angles is seen. Mild interstitial thickening is noted without focal infiltrate. No acute bony abnormality is noted. IMPRESSION: Stable appearance of the chest when compared with the prior exam. Electronically Signed   By: Oneil Devonshire M.D.   On: 12/11/2023 02:34    EKG:  Personally reviewed sinus rhythm, with PAC, LAE, possible RAE, nonspecific T wave changes laterally.  ED Course:  Was 80% on home O2/CPAP. With EMS treated with DuoNebs, EpiPen, magnesium , Solu-Medrol , placed on BiPAP. Treated with additional nebs in ED. Improving respiratory distress    Assessment/Plan:  63 y.o. 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 He is not having significant cough/sputum, though has trouble with clearance chronically. Home O2 is 10L -> sometimes increases to 15L with activity. On EMS arrival was  80% on home O2. See treatment in ED course above, was placed on BiPAP. Resp distress improving. CXR stable from prior, does show hyperexpansion and mild interstitial thickening without focal infiltrate.  --Continue BiPAP  -Methylprednisolone  60 mg IV q12 hr until improving resp function, then switch to Prednisone   -Give Cefepime  2g IV q 8 hr for COPD exacerbation, I/s/o recent Abx exposures  -Check Flu / COVID / RSV, sputum culture -DuoNebs every 6 hours scheduled, albuterol  every 4 hours as needed, incentive  spirometer, encourage out of bed to chair. --Continue his home Roflumilast , singulair , sub home symbicort  -> breo ellipta . He prefers to bring in Spiriva   -Home O2 evaluation prior to discharge -Consider referral to pulmonary rehab  Chronic medical problems:  Pulmonary nodules, mediastinal / hilar LAD, c/f malignancy  CTA in 4/'25 with irregular 2.1 cm lingular and 1 cm RUL nodule, hilar and mediastinal LAD which had increased findings concerning for potential malignancy -> Repeat CT Chest on 11/01/23 with regression of spiculated LUL / lingular focus, Distortion superiorly / anteriorly 1.3 x 0.7 cm noted, and persistent nodular spiculation in posterior RUL 1 x 1.2 cm (previously 1.1 x 0.9 cm).  -- He is planned for PETCT as outpatient on 10/9 and will f/u with pulmonology after this study    Chronic medical problems:  HTN: Continue home Amlodipine . I discussed possibly stopping metoprolol  with him since there does not seem to be a strong indication and having recurrent COPD exacerbation, he wants to continue for now and talk with PCP about it.   HLD:  CKD stage 2: Cr near baseline ~1 Gout: Continue home allopurinol   Former smoker: Noted, no active smoking   There is no height or weight on file to calculate BMI.    DVT prophylaxis:  Lovenox  Code Status:  DNR/DNI(Do NOT Intubate); confirmed with patient  Diet:  Diet Orders (From admission, onward)    None      Family Communication:  None  Consults:  None   Admission status:   Inpatient, Step Down Unit  Severity of Illness: The appropriate patient status for this patient is INPATIENT. Inpatient status is judged to be reasonable and necessary in order to provide the required intensity of service to ensure the patient's safety. The patient's presenting symptoms, physical exam findings, and initial radiographic and laboratory data in the context of their chronic comorbidities is felt to place them at high risk for further clinical  deterioration. Furthermore, it is not anticipated that the patient will be medically stable for discharge from the hospital within 2 midnights of admission.   * I certify that at the point of admission it is my clinical judgment that the patient will require inpatient hospital care spanning beyond 2 midnights from the point of admission due to high intensity of service, high risk for further deterioration and high frequency of surveillance required.*   Dorn Dawson, MD [facility]  How to contact the [facility] Attending or Consulting provider 7A - 7P or covering provider during after hours 7P -7A, for this patient.  Check the care team in [facility] and look for a) attending/consulting [facility] provider listed and b) the [facility] team listed Log into [email or web address] and use [facility] universal password to access. If you do not have the password, please contact the hospital operator. Locate the [facility] provider you are looking for under [facility] and page to a number that you can be directly reached. If you still have difficulty reaching the provider, please page the DOC (  Director on Call) for the Hospitalists listed on amion for assistance.  12/11/2023, 3:34 AM

## 2023-12-11 NOTE — ED Notes (Signed)
 Pt requested to sit up on the side of the stretcher to use the urinal. Upon sitting up, oxygen  saturation dropped to 85% on BIPAP and pt had increased shob. Staff notified pt that he should lay back down in the bed and continue to rest but we could help him urinate another way. Pt agreeable to condom catheter.

## 2023-12-11 NOTE — ED Notes (Signed)
 RT at [facility].

## 2023-12-11 NOTE — ED Notes (Addendum)
 Condom cath came off. RN assisted with urinal. Requesting Bipap be removed. RT notified and agreeable. Trialing on 5L HFNC humidified while resting in stretcher. Pt reportedly uses 10L [place] when active at home. Will monitor and reassess.

## 2023-12-11 NOTE — ED Notes (Signed)
 No changes. VSS. Remains tachypneic and tachycardic. Pt tolerating HFNC, calm, NAD, skin W&D, sitting upright, tolerating PO fluids (sips). Transport requested. Pending transport.

## 2023-12-11 NOTE — ED Triage Notes (Signed)
 Pt bib [facility] from home where he was wearing his CPAP and felt increased shob. Pt found to be 80% with EMS and was placed on their CPAP. Pt arrives with difficulty breathing and diminished lung sounds. Received 2 duo nebs, 1 albuterol , 0.5mg  epi, 2G mag, and 125mg  solumedrol en route

## 2023-12-11 NOTE — ED Provider Notes (Signed)
 [facility] Emergency Department Provider Note MRN:  [id number]  Arrival date & time: 12/11/23     Chief Complaint   Shortness of Breath   History of Present Illness   Gregory Young. is a 63 y.o. year-old male with a history of COPD presenting to the ED with chief complaint of shortness of breath.  Worsening shortness of breath at home despite wearing his CPAP.  Found to be 80% on his CPAP and respiratory distress.  Multiple interventions by EMS on the way here.  Review of Systems  A thorough review of systems was obtained and all systems are negative except as noted in the HPI and PMH.   Patient's Health History   No past medical history on file.    No family history on file.  Social History   Socioeconomic History   Marital status: Legally Separated    Spouse name: Not on file   Number of children: Not on file   Years of education: Not on file   Highest education level: Not on file  Occupational History   Not on file  Tobacco Use   Smoking status: Not on file   Smokeless tobacco: Not on file  Substance and Sexual Activity   Alcohol use: Not on file   Drug use: Not on file   Sexual activity: Not on file  Other Topics Concern   Not on file  Social History Narrative   Not on file   Social Drivers of Health   Financial Resource Strain: Not on file  Food Insecurity: Not on file  Transportation Needs: Not on file  Physical Activity: Not on file  Stress: Not on file  Social Connections: Not on file  Intimate Partner Violence: Not on file     Physical Exam   Vitals:   12/11/23 0225 12/11/23 0245  BP:  119/78  Pulse:  94  Resp:  (!) 22  Temp:    SpO2: 94% 94%    CONSTITUTIONAL: Ill-appearing, severe respiratory distress NEURO/PSYCH:  Alert and oriented x 3, no focal deficits EYES:  eyes equal and reactive ENT/NECK:  no LAD, no JVD CARDIO: Tachycardic rate, well-perfused, normal S1 and S2 PULM: Tachypneic, significant retractions  and increased work of breathing, very poor air movement GI/GU:  non-distended, non-tender MSK/SPINE:  No gross deformities, no edema SKIN:  no rash, atraumatic   *Additional and/or pertinent findings included in MDM below  Diagnostic and Interventional Summary    EKG Interpretation Date/Time:  Tuesday December 11 2023 02:07:31 EDT Ventricular Rate:  101 PR Interval:  161 QRS Duration:  98 QT Interval:  357 QTC Calculation: 463 R Axis:   71  Text Interpretation: Sinus tachycardia Atrial premature complexes Probable left atrial enlargement Confirmed by Theadore Sharper [id number]) on 12/11/2023 2:43:56 AM       Labs Reviewed  CBC - Abnormal; Notable for the following components:      Result Value   WBC 15.0 (*)    RBC 6.19 (*)    MCV 76.4 (*)    MCH 23.3 (*)    All other components within normal limits  BASIC METABOLIC PANEL WITH GFR - Abnormal; Notable for the following components:   Chloride 96 (*)    All other components within normal limits  BRAIN NATRIURETIC PEPTIDE  TROPONIN I (HIGH SENSITIVITY)    DG Chest Port 1 View  Final Result      Medications  ipratropium-albuterol  (DUONEB) 0.5-2.5 (3) MG/3ML nebulizer solution 3 mL (3 mLs  Nebulization Given 12/11/23 0225)  ipratropium-albuterol  (DUONEB) 0.5-2.5 (3) MG/3ML nebulizer solution 3 mL (3 mLs Nebulization Given 12/11/23 0225)     Procedures  /  Critical Care .Critical Care  Performed by: Theadore Ozell HERO, MD Authorized by: Theadore Ozell HERO, MD   Critical care provider statement:    Critical care time (minutes):  45   Critical care was necessary to treat or prevent imminent or life-threatening deterioration of the following conditions:  Respiratory failure   Critical care was time spent personally by me on the following activities:  Development of treatment plan with patient or surrogate, discussions with consultants, evaluation of patient's response to treatment, examination of patient, ordering and review of laboratory  studies, ordering and review of radiographic studies, ordering and performing treatments and interventions, pulse oximetry, re-evaluation of patient's condition and review of old charts   ED Course and Medical Decision Making  Initial Impression and Ddx Patient seems to be improving with interventions from EMS, DuoNebs, EpiPen, magnesium , Solu-Medrol .  Seems like he will recover with continued BiPAP.  No need for emergent intubation at this time, he is awake and alert.  All seems consistent with COPD exacerbation.  Past medical/surgical history that increases complexity of ED encounter: COPD  Interpretation of Diagnostics I personally reviewed the EKG and my interpretation is as follows: Sinus tachycardia  No significant blood count or electrolyte disturbance.  Chest x-ray unremarkable.  Patient Reassessment and Ultimate Disposition/Management     Request hospitalist admission for COPD exacerbation.  Patient management required discussion with the following services or consulting groups:  Hospitalist Service  Complexity of Problems Addressed Acute illness or injury that poses threat of life of bodily function  Additional Data Reviewed and Analyzed Further history obtained from: EMS on arrival  Additional Factors Impacting ED Encounter Risk Consideration of hospitalization  Ozell HERO. Theadore, MD [facility] Emergency Medicine [facility] [email or web address]  Final Clinical Impressions(s) / ED Diagnoses     ICD-10-CM   1. Acute respiratory failure with hypoxia (HCC)  J96.01       ED Discharge Orders     None        Discharge Instructions Discussed with and Provided to Patient:   Discharge Instructions   None      Theadore Ozell HERO, MD 12/11/23 [phone]

## 2023-12-11 NOTE — Telephone Encounter (Signed)
 Called patient and he said he would give us  a call back later.

## 2023-12-12 ENCOUNTER — Other Ambulatory Visit: Payer: Self-pay

## 2023-12-12 ENCOUNTER — Other Ambulatory Visit: Payer: Self-pay | Admitting: *Deleted

## 2023-12-12 DIAGNOSIS — J441 Chronic obstructive pulmonary disease with (acute) exacerbation: Secondary | ICD-10-CM | POA: Diagnosis not present

## 2023-12-12 NOTE — Progress Notes (Signed)
 PROGRESS NOTE    Gregory Young.  FMW:[id number] DOB: [birth year] DOA: 12/11/2023 PCP: Delbert Clam, MD   Brief Narrative:    Assessment & Plan:   Principal Problem:   COPD exacerbation [facility])   Assessment/Plan:   63 y.o. 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 He is not having significant cough/sputum, though has trouble with clearance chronically. Home O2 is 10L -> sometimes increases to 15L with activity. On EMS arrival was 80% on home O2. See treatment in ED course above, was placed on BiPAP. Resp distress improving. CXR stable from prior, does show hyperexpansion and mild interstitial thickening without focal infiltrate.  --Currently off BiPAP-> [place] 10 L/min -Methylprednisolone  60 mg IV q12 hr until improving resp function, then switch to Prednisone   -Give Cefepime  2g IV q 8 hr for COPD exacerbation, I/s/o recent Abx exposures  -Check Flu / COVID / RSV, sputum culture -DuoNebs every 6 hours scheduled, albuterol  every 4 hours as needed, incentive spirometer, encourage out of bed to chair. --Continue his home Roflumilast , singulair , sub home symbicort  -> breo ellipta . He prefers to bring in Spiriva   -Home O2 evaluation prior to discharge -Consider referral to pulmonary rehab   Chronic medical problems:  Pulmonary nodules, mediastinal / hilar LAD, c/f malignancy  CTA in 4/'25 with irregular 2.1 cm lingular and 1 cm RUL nodule, hilar and mediastinal LAD which had increased findings concerning for potential malignancy -> Repeat CT Chest on 11/01/23 with regression of spiculated LUL / lingular focus, Distortion superiorly /  anteriorly 1.3 x 0.7 cm noted, and persistent nodular spiculation in posterior RUL 1 x 1.2 cm (previously 1.1 x 0.9 cm).  -- He is planned for PETCT as outpatient on 10/9 and will f/u with pulmonology after this study    Chronic medical problems:  HTN: Continue home Amlodipine . I discussed possibly stopping metoprolol  with him since there does not seem to be a strong indication and having recurrent COPD exacerbation, he wants to continue for now and talk with PCP about it.   HLD:  CKD stage 2: Cr near baseline ~1 Gout: Continue home allopurinol   Former smoker: Noted, no active smoking    There is no height or weight on file to calculate BMI.     DVT prophylaxis:  Lovenox  Code Status:  DNR/DNI(Do NOT Intubate);   Family Communication: No family at the bedside Disposition Plan: Status is: Inpatient     Consultants:   Procedures:   Antimicrobials:    Subjective:  Patient seen and examined at the bedside.  He is sitting on edge of the bed doing incentive spirometry.  Reports improvement in his symptoms.  He does report feeling fatigued even with slight movement.  No fever or chills.  Some cough.  Objective: Vitals:   12/12/23 0510 12/12/23 0831 12/12/23 0855 12/12/23 1133  BP: 121/74 132/82 132/82 (!) 139/91  Pulse: 81 94 97 96  Resp: 17 (!) 24  18  Temp: 98 F (36.7 C) 98.2 F (36.8 C)  98.7 F (37.1 C)  TempSrc: Axillary Oral  Oral  SpO2: 94% 93%  93%  Weight: 80.9 kg     Height:  Intake/Output Summary (Last 24 hours) at 12/12/2023 1430 Last data filed at 12/12/2023 1213 Gross per 24 hour  Intake 419.59 ml  Output 1500 ml  Net -1080.41 ml   Filed Weights   12/11/23 0400 12/11/23 1022 12/12/23 0510  Weight: 81.6 kg 81.1 kg 80.9 kg    Examination:  General exam: Appears calm and comfortable  Respiratory system: Bilateral decreased breath sounds at bases Cardiovascular system: S1 & S2 heard, Rate controlled Gastrointestinal system: Abdomen is  nondistended, soft and nontender. Normal bowel sounds heard. Extremities: No cyanosis, clubbing, edema  Central nervous system: Alert and oriented. No focal neurological deficits. Moving extremities Skin: No rashes, lesions or ulcers Psychiatry: Judgement and insight appear normal. Mood & affect appropriate.     Data Reviewed: I have personally reviewed following labs and imaging studies  CBC: Recent Labs  Lab 12/11/23 0206  WBC 15.0*  HGB 14.4  HCT 47.3  MCV 76.4*  PLT 328   Basic Metabolic Panel: Recent Labs  Lab 12/11/23 0206  NA 135  K 3.7  CL 96*  CO2 27  GLUCOSE 97  BUN 8  CREATININE 1.11  CALCIUM  9.8   GFR: Estimated Creatinine Clearance: 67.1 mL/min (by C-G formula based on SCr of 1.11 mg/dL). Liver Function Tests: No results for input(s): AST, ALT, ALKPHOS, BILITOT, PROT, ALBUMIN in the last 168 hours. No results for input(s): LIPASE, AMYLASE in the last 168 hours. No results for input(s): AMMONIA in the last 168 hours. Coagulation Profile: No results for input(s): INR, PROTIME in the last 168 hours. Cardiac Enzymes: No results for input(s): CKTOTAL, CKMB, CKMBINDEX, TROPONINI in the last 168 hours. BNP (last 3 results) No results for input(s): PROBNP in the last 8760 hours. HbA1C: No results for input(s): HGBA1C in the last 72 hours. CBG: No results for input(s): GLUCAP in the last 168 hours. Lipid Profile: No results for input(s): CHOL, HDL, LDLCALC, TRIG, CHOLHDL, LDLDIRECT in the last 72 hours. Thyroid  Function Tests: No results for input(s): TSH, T4TOTAL, FREET4, T3FREE, THYROIDAB in the last 72 hours. Anemia Panel: No results for input(s): VITAMINB12, FOLATE, FERRITIN, TIBC, IRON , RETICCTPCT in the last 72 hours. Sepsis Labs: No results for input(s): PROCALCITON, LATICACIDVEN in the last 168 hours.  Recent Results (from the past 240 hours)  Resp panel by RT-PCR (RSV, Flu  A&B, Covid) Anterior Nasal Swab     Status: None   Collection Time: 12/11/23  4:09 AM   Specimen: Anterior Nasal Swab  Result Value Ref Range Status   SARS Coronavirus 2 by RT PCR NEGATIVE NEGATIVE Final   Influenza A by PCR NEGATIVE NEGATIVE Final   Influenza B by PCR NEGATIVE NEGATIVE Final    Comment: (NOTE) The Xpert Xpress SARS-CoV-2/FLU/RSV plus assay is intended as an aid in the diagnosis of influenza from Nasopharyngeal swab specimens and should not be used as a sole basis for treatment. Nasal washings and aspirates are unacceptable for Xpert Xpress SARS-CoV-2/FLU/RSV testing.  Fact Sheet for Patients: BloggerCourse.com  Fact Sheet for Healthcare Providers: SeriousBroker.it  This test is not yet approved or cleared by the [place]  FDA and has been authorized for detection and/or diagnosis of SARS-CoV-2 by FDA under an Emergency Use Authorization (EUA). This EUA will remain in effect (meaning this test can be used) for the duration of the COVID-19 declaration under Section 564(b)(1) of the Act, 21 U.S.C. section 360bbb-3(b)(1), unless the authorization is terminated or revoked.     Resp Syncytial Virus by PCR NEGATIVE NEGATIVE Final  Comment: (NOTE) Fact Sheet for Patients: BloggerCourse.com  Fact Sheet for Healthcare Providers: SeriousBroker.it  This test is not yet approved or cleared by the [place]  FDA and has been authorized for detection and/or diagnosis of SARS-CoV-2 by FDA under an Emergency Use Authorization (EUA). This EUA will remain in effect (meaning this test can be used) for the duration of the COVID-19 declaration under Section 564(b)(1) of the Act, 21 U.S.C. section 360bbb-3(b)(1), unless the authorization is terminated or revoked.  Performed at [facility], [address], [place], [place] [address]   MRSA Next Gen by PCR, Nasal      Status: None   Collection Time: 12/11/23 10:49 AM   Specimen: Nasal Mucosa; Nasal Swab  Result Value Ref Range Status   MRSA by PCR Next Gen NOT DETECTED NOT DETECTED Final    Comment: (NOTE) The GeneXpert MRSA Assay (FDA approved for NASAL specimens only), is one component of a comprehensive MRSA colonization surveillance program. It is not intended to diagnose MRSA infection nor to guide or monitor treatment for MRSA infections. Test performance is not FDA approved in patients less than 59 years old. Performed at [facility], [address] [place], [place] [address]          Radiology Studies: DG Chest Port 1 View Result Date: 12/11/2023 CLINICAL DATA:  Shortness of breath EXAM: PORTABLE CHEST 1 VIEW COMPARISON:  10/31/2023 FINDINGS: Cardiac shadow is stable. Lungs are hyperinflated with emphysematous change. Chronic blunting of the costophrenic angles is seen. Mild interstitial thickening is noted without focal infiltrate. No acute bony abnormality is noted. IMPRESSION: Stable appearance of the chest when compared with the prior exam. Electronically Signed   By: Oneil Devonshire M.D.   On: 12/11/2023 02:34        Scheduled Meds:  allopurinol   100 mg Oral Daily   amLODipine   10 mg Oral Daily   enoxaparin  (LOVENOX ) injection  40 mg Subcutaneous Q24H   ezetimibe   10 mg Oral Daily   fluticasone  furoate-vilanterol  1 puff Inhalation Q2000   ipratropium-albuterol   3 mL Nebulization Q6H   methylPREDNISolone  (SOLU-MEDROL ) injection  60 mg Intravenous Q12H   metoprolol  succinate  50 mg Oral Daily   montelukast   10 mg Oral QHS   roflumilast   500 mcg Oral Daily   sodium chloride  flush  3 mL Intravenous Q12H   Continuous Infusions:  ceFEPime  (MAXIPIME ) IV 2 g (12/12/23 0603)          Derryl Duval, MD [facility] 12/12/2023, 2:30 PM

## 2023-12-12 NOTE — Plan of Care (Signed)
   Problem: Clinical Measurements: Goal: Will remain free from infection Outcome: Progressing Goal: Respiratory complications will improve Outcome: Progressing   Problem: Activity: Goal: Risk for activity intolerance will decrease Outcome: Progressing   Problem: Nutrition: Goal: Adequate nutrition will be maintained Outcome: Progressing   Problem: Coping: Goal: Level of anxiety will decrease Outcome: Progressing

## 2023-12-12 NOTE — Progress Notes (Signed)
   12/12/23 2010  BiPAP/CPAP/SIPAP  BiPAP/CPAP/SIPAP Pt Type Adult  BiPAP/CPAP/SIPAP SERVO  Mask Type Full face mask  Dentures removed? Not applicable  Mask Size Large  Set Rate 0 breaths/min  Respiratory Rate 25 breaths/min  Pressure Support 5 cmH20  PEEP 5 cmH20  FiO2 (%) 50 %  Minute Ventilation 27.6  Leak 21  Peak Inspiratory Pressure (PIP) 10  Tidal Volume (Vt) 973  Patient Home Machine No  Patient Home Mask No  Patient Home Tubing No  Auto Titrate No  Press High Alarm 20 cmH2O  Press Low Alarm 2 cmH2O  Nasal massage performed Yes  CPAP/SIPAP surface wiped down Yes  Device Plugged into RED Power Outlet Yes  Oxygen  Percent 50 %

## 2023-12-12 NOTE — Plan of Care (Signed)

## 2023-12-13 ENCOUNTER — Other Ambulatory Visit: Payer: Self-pay

## 2023-12-13 ENCOUNTER — Encounter (HOSPITAL_COMMUNITY): Payer: Self-pay | Admitting: Internal Medicine

## 2023-12-13 LAB — CBC WITH DIFFERENTIAL/PLATELET
Abs Immature Granulocytes: 0.26 K/uL — ABNORMAL HIGH (ref 0.00–0.07)
Basophils Absolute: 0 K/uL (ref 0.0–0.1)
Basophils Relative: 0 %
Eosinophils Absolute: 0 K/uL (ref 0.0–0.5)
Eosinophils Relative: 0 %
HCT: 43.7 % (ref 39.0–52.0)
Hemoglobin: 13.4 g/dL (ref 13.0–17.0)
Immature Granulocytes: 1 %
Lymphocytes Relative: 6 %
Lymphs Abs: 1.4 K/uL (ref 0.7–4.0)
MCH: 23.2 pg — ABNORMAL LOW (ref 26.0–34.0)
MCHC: 30.7 g/dL (ref 30.0–36.0)
MCV: 75.7 fL — ABNORMAL LOW (ref 80.0–100.0)
Monocytes Absolute: 1 K/uL (ref 0.1–1.0)
Monocytes Relative: 4 %
Neutro Abs: 20.6 K/uL — ABNORMAL HIGH (ref 1.7–7.7)
Neutrophils Relative %: 89 %
Platelets: 316 K/uL (ref 150–400)
RBC: 5.77 MIL/uL (ref 4.22–5.81)
RDW: 13.8 % (ref 11.5–15.5)
WBC: 23.4 K/uL — ABNORMAL HIGH (ref 4.0–10.5)
nRBC: 0 % (ref 0.0–0.2)

## 2023-12-13 LAB — BASIC METABOLIC PANEL WITH GFR
Anion gap: 15 (ref 5–15)
BUN: 21 mg/dL (ref 8–23)
CO2: 22 mmol/L (ref 22–32)
Calcium: 9.4 mg/dL (ref 8.9–10.3)
Chloride: 100 mmol/L (ref 98–111)
Creatinine, Ser: 1.21 mg/dL (ref 0.61–1.24)
GFR, Estimated: >60 mL/min (ref >60)
Glucose, Bld: 109 mg/dL — ABNORMAL HIGH (ref 70–99)
Potassium: 5.9 mmol/L — ABNORMAL HIGH (ref 3.5–5.1)
Sodium: 137 mmol/L (ref 135–145)

## 2023-12-13 MED ORDER — METHYLPREDNISOLONE SODIUM SUCC 125 MG IJ SOLR
80.0000 mg | Freq: Every day | INTRAMUSCULAR | Status: DC
  Administered 2023-12-14 – 2023-12-15 (×2): 80 mg via INTRAVENOUS
  Filled 2023-12-13 (×2): qty 2

## 2023-12-13 NOTE — Plan of Care (Signed)

## 2023-12-13 NOTE — Plan of Care (Signed)
  Problem: Clinical Measurements: Goal: Diagnostic test results will improve Outcome: Progressing Goal: Cardiovascular complication will be avoided Outcome: Progressing   Problem: Activity: Goal: Risk for activity intolerance will decrease Outcome: Progressing   Problem: Coping: Goal: Level of anxiety will decrease Outcome: Progressing   Problem: Elimination: Goal: Will not experience complications related to bowel motility Outcome: Progressing Goal: Will not experience complications related to urinary retention Outcome: Progressing   Problem: Pain Managment: Goal: General experience of comfort will improve and/or be controlled Outcome: Progressing   Problem: Safety: Goal: Ability to remain free from injury will improve Outcome: Progressing

## 2023-12-13 NOTE — Progress Notes (Signed)
 PROGRESS NOTE    Gregory Young.  FMW:[id number] DOB: [birth year] DOA: 12/11/2023 PCP: Delbert Clam, MD   Brief Narrative:    Assessment & Plan:   Principal Problem:   COPD exacerbation [facility])   Assessment/Plan:   63 y.o. 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 He is not having significant cough/sputum, though has trouble with clearance chronically. Home O2 is 10L -> sometimes increases to 15L with activity. On EMS arrival was 80% on home O2. See treatment in ED course above, was placed on BiPAP. Resp distress improving. CXR stable from prior, does show hyperexpansion and mild interstitial thickening without focal infiltrate.  --Currently  [place] 10 L/min -9/4 Methylprednisolone  60 mg IV q12 hr will be decreased to 40 mg IV every 12 hours -Give Cefepime  2g IV q 8 hr for COPD exacerbation, I/s/o recent Abx exposures  -Check Flu / COVID / RSV, sputum culture -DuoNebs every 6 hours scheduled, albuterol  every 4 hours as needed, incentive spirometer, encourage out of bed to chair. --Continue his home Roflumilast , singulair , sub home symbicort -> breo ellipta . He prefers to bring in Spiriva  -Home O2 evaluation prior to discharge -Consider referral to pulmonary rehab   Chronic medical problems:  Pulmonary nodules, mediastinal / hilar LAD, c/f malignancy  CTA in 4/'25 with irregular 2.1 cm lingular and 1 cm RUL nodule, hilar and mediastinal LAD which had increased findings concerning for potential malignancy -> Repeat CT Chest on 11/01/23 with regression of spiculated LUL / lingular focus, Distortion superiorly / anteriorly 1.3 x 0.7 cm  noted, and persistent nodular spiculation in posterior RUL 1 x 1.2 cm (previously 1.1 x 0.9 cm).  -- He is planned for PETCT as outpatient on 10/9 and will f/u with pulmonology after this study    Chronic medical problems:  HTN: Continue home Amlodipine , metoprolol  HLD:  CKD stage 2: Cr near baseline ~1 Gout: Continue home allopurinol   Former smoker: Noted, no active smoking    There is no height or weight on file to calculate BMI.     DVT prophylaxis:  Lovenox  Code Status:  DNR/DNI(Do NOT Intubate);   Family Communication: No family at the bedside Disposition Plan: Status is: Inpatient     Consultants:   Procedures:   Antimicrobials:    Subjective:  Patient seen and examined at the bedside.  He is sitting on edge of the bed. Reports gradual improvement in his symptoms.  He does report feeling fatigued even with slight movement.  No fever or chills.  Some cough.  Does not tolerate BiPAP well.  Objective: Vitals:   12/12/23 2300 12/13/23 0600 12/13/23 0708 12/13/23 1102  BP: (!) 138/93  (!) 152/90 127/77  Pulse:   90 85  Resp:   20 (!) 22  Temp: (!) 97.1 F (36.2 C)  98 F (36.7 C) 97.9 F (36.6 C)  TempSrc: Oral  Oral Oral  SpO2: 95%  95% 93%  Weight:  82 kg    Height:        Intake/Output Summary (Last 24 hours) at 12/13/2023 1512 Last data filed at 12/13/2023 1402 Gross per 24 hour  Intake 120 ml  Output 2200 ml  Net -2080 ml   Filed Weights   12/11/23 1022 12/12/23 0510 12/13/23 0600  Weight: 81.1 kg 80.9 kg 82 kg    Examination:  General exam: Appears calm and comfortable  Respiratory system: Bilateral decreased breath sounds at bases Cardiovascular system: S1 & S2 heard, Rate controlled Gastrointestinal system: Abdomen is nondistended, soft and nontender. Normal bowel sounds heard. Extremities: No cyanosis, clubbing, edema  Central nervous system: Alert and oriented. No focal neurological deficits. Moving extremities Skin: No rashes, lesions or  ulcers Psychiatry: Judgement and insight appear normal. Mood & affect appropriate.     Data Reviewed: I have personally reviewed following labs and imaging studies  CBC: Recent Labs  Lab 12/11/23 0206 12/13/23 0906  WBC 15.0* 23.4*  NEUTROABS  --  20.6*  HGB 14.4 13.4  HCT 47.3 43.7  MCV 76.4* 75.7*  PLT 328 316   Basic Metabolic Panel: Recent Labs  Lab 12/11/23 0206 12/13/23 0906  NA 135 137  K 3.7 5.9*  CL 96* 100  CO2 27 22  GLUCOSE 97 109*  BUN 8 21  CREATININE 1.11 1.21  CALCIUM  9.8 9.4   GFR: Estimated Creatinine Clearance: 62 mL/min (by C-G formula based on SCr of 1.21 mg/dL). Liver Function Tests: No results for input(s): AST, ALT, ALKPHOS, BILITOT, PROT, ALBUMIN in the last 168 hours. No results for input(s): LIPASE, AMYLASE in the last 168 hours. No results for input(s): AMMONIA in the last 168 hours. Coagulation Profile: No results for input(s): INR, PROTIME in the last 168 hours. Cardiac Enzymes: No results for input(s): CKTOTAL, CKMB, CKMBINDEX, TROPONINI in the last 168 hours. BNP (last 3 results) No results for input(s): PROBNP in the last 8760 hours. HbA1C: No results for input(s): HGBA1C in the last 72 hours. CBG: No results for input(s): GLUCAP in the last 168 hours. Lipid Profile: No results for input(s): CHOL, HDL, LDLCALC, TRIG, CHOLHDL, LDLDIRECT in the last 72 hours. Thyroid  Function Tests: No results for input(s): TSH, T4TOTAL, FREET4, T3FREE, THYROIDAB in the last 72 hours. Anemia Panel: No results for input(s): VITAMINB12, FOLATE, FERRITIN, TIBC, IRON , RETICCTPCT in the last 72 hours. Sepsis Labs: No results for input(s): PROCALCITON, LATICACIDVEN in the last 168 hours.  Recent Results (from the past 240 hours)  Resp panel by RT-PCR (RSV, Flu A&B, Covid) Anterior Nasal Swab     Status: None   Collection Time: 12/11/23  4:09 AM   Specimen: Anterior Nasal Swab   Result Value Ref Range Status   SARS Coronavirus 2 by RT PCR NEGATIVE NEGATIVE Final   Influenza A by PCR NEGATIVE NEGATIVE Final   Influenza B by PCR NEGATIVE NEGATIVE Final    Comment: (NOTE) The Xpert Xpress SARS-CoV-2/FLU/RSV plus assay is intended as an aid in the diagnosis of influenza from Nasopharyngeal swab specimens and should not be used as a sole basis for treatment. Nasal washings and aspirates are unacceptable for Xpert Xpress SARS-CoV-2/FLU/RSV testing.  Fact Sheet for Patients: BloggerCourse.com  Fact Sheet for Healthcare Providers: SeriousBroker.it  This test is not yet approved or cleared by the [place]  FDA and has been authorized for detection and/or diagnosis of SARS-CoV-2 by FDA under an Emergency Use Authorization (EUA). This EUA will remain in effect (meaning this test can be used) for the duration of the COVID-19 declaration under Section 564(b)(1) of the Act, 21 U.S.C. section 360bbb-3(b)(1), unless the authorization is terminated or revoked.     Resp Syncytial Virus by PCR NEGATIVE NEGATIVE Final  Comment: (NOTE) Fact Sheet for Patients: BloggerCourse.com  Fact Sheet for Healthcare Providers: SeriousBroker.it  This test is not yet approved or cleared by the [place]  FDA and has been authorized for detection and/or diagnosis of SARS-CoV-2 by FDA under an Emergency Use Authorization (EUA). This EUA will remain in effect (meaning this test can be used) for the duration of the COVID-19 declaration under Section 564(b)(1) of the Act, 21 U.S.C. section 360bbb-3(b)(1), unless the authorization is terminated or revoked.  Performed at [facility], [address], [place], [place] [address]   MRSA Next Gen by PCR, Nasal     Status: None   Collection Time: 12/11/23 10:49 AM   Specimen: Nasal Mucosa; Nasal Swab  Result Value Ref Range  Status   MRSA by PCR Next Gen NOT DETECTED NOT DETECTED Final    Comment: (NOTE) The GeneXpert MRSA Assay (FDA approved for NASAL specimens only), is one component of a comprehensive MRSA colonization surveillance program. It is not intended to diagnose MRSA infection nor to guide or monitor treatment for MRSA infections. Test performance is not FDA approved in patients less than 63 years old. Performed at [facility], [address] [place], [place] [address]          Radiology Studies: No results found.       Scheduled Meds:  allopurinol   100 mg Oral Daily   amLODipine   10 mg Oral Daily   enoxaparin  (LOVENOX ) injection  40 mg Subcutaneous Q24H   ezetimibe   10 mg Oral Daily   fluticasone  furoate-vilanterol  1 puff Inhalation Q2000   ipratropium-albuterol   3 mL Nebulization Q6H   [START ON 12/14/2023] methylPREDNISolone  (SOLU-MEDROL ) injection  40 mg Intravenous Q12H   metoprolol  succinate  50 mg Oral Daily   montelukast   10 mg Oral QHS   roflumilast   500 mcg Oral Daily   sodium chloride  flush  3 mL Intravenous Q12H   Continuous Infusions:  ceFEPime  (MAXIPIME ) IV 2 g (12/13/23 1226)          Derryl Duval, MD [facility] 12/13/2023, 3:12 PM

## 2023-12-13 NOTE — Telephone Encounter (Signed)
 Informed patient about PET scan schedule. Patient is in the hospital and said to call him back next week to schedule appointment with Dr Theophilus.

## 2023-12-14 ENCOUNTER — Other Ambulatory Visit: Payer: Self-pay

## 2023-12-14 DIAGNOSIS — J441 Chronic obstructive pulmonary disease with (acute) exacerbation: Secondary | ICD-10-CM | POA: Diagnosis not present

## 2023-12-14 LAB — BASIC METABOLIC PANEL WITH GFR
Anion gap: 10 (ref 5–15)
BUN: 14 mg/dL (ref 8–23)
CO2: 29 mmol/L (ref 22–32)
Calcium: 9.4 mg/dL (ref 8.9–10.3)
Chloride: 98 mmol/L (ref 98–111)
Creatinine, Ser: 1.12 mg/dL (ref 0.61–1.24)
GFR, Estimated: >60 mL/min (ref >60)
Glucose, Bld: 186 mg/dL — ABNORMAL HIGH (ref 70–99)
Potassium: 3.8 mmol/L (ref 3.5–5.1)
Sodium: 137 mmol/L (ref 135–145)

## 2023-12-14 MED ORDER — SALINE SPRAY 0.65 % NA SOLN
1.0000 | NASAL | Status: DC | PRN
  Filled 2023-12-14: qty 44

## 2023-12-14 MED ORDER — LOPERAMIDE HCL 2 MG PO CAPS
2.0000 mg | ORAL_CAPSULE | ORAL | Status: DC | PRN
  Administered 2023-12-14 – 2023-12-18 (×6): 2 mg via ORAL
  Filled 2023-12-14 (×6): qty 1

## 2023-12-14 NOTE — Progress Notes (Signed)
 PROGRESS NOTE    Gregory Young.  FMW:[id number] DOB: 1960-10-31 DOA: 12/11/2023 PCP: Delbert Clam, MD   Brief Narrative:    Assessment & Plan:   Principal Problem:   COPD exacerbation [facility]   Assessment/Plan:   63 y.o. 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 He is not having significant cough/sputum, though has trouble with clearance chronically. Home O2 is 10L -> sometimes increases to 15L with activity. On EMS arrival was 80% on home O2. See treatment in ED course above, was placed on BiPAP. Resp distress improving. CXR stable from prior, does show hyperexpansion and mild interstitial thickening without focal infiltrate.  --Currently  [place]ray 10 L/min -9/4 Methylprednisolone  60 mg IV q12 hr decreased to 80 mg IV daily, will continue it -Give Cefepime  2g IV q 8 hr for COPD exacerbation, I/s/o recent Abx exposures  -Check Flu / COVID / RSV, sputum culture -DuoNebs every 6 hours scheduled, albuterol  every 4 hours as needed, incentive spirometer, encourage out of bed to chair. --Continue his home Roflumilast , singulair , sub home symbicort  -> breo ellipta . He prefers to bring in Spiriva   -Home O2 evaluation prior to discharge -Consider referral to pulmonary rehab   Chronic medical problems:  Pulmonary nodules, mediastinal / hilar LAD, c/f malignancy  CTA in 4/'25 with irregular 2.1 cm lingular and 1 cm RUL nodule, hilar and mediastinal LAD which had increased findings concerning for potential malignancy -> Repeat CT Chest on 11/01/23 with regression of spiculated LUL / lingular focus, Distortion superiorly / anteriorly 1.3 x 0.7 cm  noted, and persistent nodular spiculation in posterior RUL 1 x 1.2 cm (previously 1.1 x 0.9 cm).  -- He is planned for PETCT as outpatient on 10/9 and will f/u with pulmonology after this study    Chronic medical problems:  HTN: Continue home Amlodipine , metoprolol  HLD:  CKD stage 2: Cr near baseline ~1 Gout: Continue home allopurinol   Former smoker: Noted, no active smoking    There is no height or weight on file to calculate BMI.     DVT prophylaxis:  Lovenox  Code Status:  DNR/DNI(Do NOT Intubate);   Family Communication: No family at the bedside Disposition Plan: Status is: Inpatient     Consultants:   Procedures:   Antimicrobials:    Subjective:  Patient seen and examined at the bedside.  He is sitting on edge of the bed. Reports gradual improvement in his symptoms.  O2 requirement 10 L/min.  Patient says he feels very short of breath and his oxygen  drops even with slight movement.  Objective: Vitals:   12/14/23 0824 12/14/23 0831 12/14/23 1147 12/14/23 1500  BP:   119/82 134/88  Pulse: 87 84 87 85  Resp: (!) 28 (!) 22 20 20   Temp:   98.3 F (36.8 C) 98.3 F (36.8 C)  TempSrc:   Oral Oral  SpO2: 99% 99% 95% 96%  Weight:      Height:        Intake/Output Summary (Last 24 hours) at 12/14/2023 1644 Last data filed at 12/14/2023 1149 Gross per 24 hour  Intake 968.85 ml  Output 2000 ml  Net -1031.15 ml  Filed Weights   12/11/23 1022 12/12/23 0510 12/13/23 0600  Weight: 81.1 kg 80.9 kg 82 kg    Examination:  General exam: Appears calm and comfortable  Respiratory system: Bilateral decreased breath sounds at bases Cardiovascular system: S1 & S2 heard, Rate controlled Gastrointestinal system: Abdomen is nondistended, soft and nontender. Normal bowel sounds heard. Extremities: No cyanosis, clubbing, edema  Central nervous system: Alert and oriented. No focal neurological deficits. Moving extremities Skin: No rashes, lesions or ulcers Psychiatry: Judgement  and insight appear normal. Mood & affect appropriate.     Data Reviewed: I have personally reviewed following labs and imaging studies  CBC: Recent Labs  Lab 12/11/23 0206 12/13/23 0906  WBC 15.0* 23.4*  NEUTROABS  --  20.6*  HGB 14.4 13.4  HCT 47.3 43.7  MCV 76.4* 75.7*  PLT 328 316   Basic Metabolic Panel: Recent Labs  Lab 12/11/23 0206 12/13/23 0906 12/14/23 0844  NA 135 137 137  K 3.7 5.9* 3.8  CL 96* 100 98  CO2 27 22 29   GLUCOSE 97 109* 186*  BUN 8 21 14   CREATININE 1.11 1.21 1.12  CALCIUM  9.8 9.4 9.4   GFR: Estimated Creatinine Clearance: 67 mL/min (by C-G formula based on SCr of 1.12 mg/dL). Liver Function Tests: No results for input(s): AST, ALT, ALKPHOS, BILITOT, PROT, ALBUMIN in the last 168 hours. No results for input(s): LIPASE, AMYLASE in the last 168 hours. No results for input(s): AMMONIA in the last 168 hours. Coagulation Profile: No results for input(s): INR, PROTIME in the last 168 hours. Cardiac Enzymes: No results for input(s): CKTOTAL, CKMB, CKMBINDEX, TROPONINI in the last 168 hours. BNP (last 3 results) No results for input(s): PROBNP in the last 8760 hours. HbA1C: No results for input(s): HGBA1C in the last 72 hours. CBG: No results for input(s): GLUCAP in the last 168 hours. Lipid Profile: No results for input(s): CHOL, HDL, LDLCALC, TRIG, CHOLHDL, LDLDIRECT in the last 72 hours. Thyroid  Function Tests: No results for input(s): TSH, T4TOTAL, FREET4, T3FREE, THYROIDAB in the last 72 hours. Anemia Panel: No results for input(s): VITAMINB12, FOLATE, FERRITIN, TIBC, IRON , RETICCTPCT in the last 72 hours. Sepsis Labs: No results for input(s): PROCALCITON, LATICACIDVEN in the last 168 hours.  Recent Results (from the past 240 hours)  Resp panel by RT-PCR (RSV, Flu A&B, Covid) Anterior Nasal Swab     Status: None   Collection Time: 12/11/23  4:09 AM   Specimen:  Anterior Nasal Swab  Result Value Ref Range Status   SARS Coronavirus 2 by RT PCR NEGATIVE NEGATIVE Final   Influenza A by PCR NEGATIVE NEGATIVE Final   Influenza B by PCR NEGATIVE NEGATIVE Final    Comment: (NOTE) The Xpert Xpress SARS-CoV-2/FLU/RSV plus assay is intended as an aid in the diagnosis of influenza from Nasopharyngeal swab specimens and should not be used as a sole basis for treatment. Nasal washings and aspirates are unacceptable for Xpert Xpress SARS-CoV-2/FLU/RSV testing.  Fact Sheet for Patients: BloggerCourse.com  Fact Sheet for Healthcare Providers: SeriousBroker.it  This test is not yet approved or cleared by the [place]  FDA and has been authorized for detection and/or diagnosis of SARS-CoV-2 by FDA under an Emergency Use Authorization (EUA). This EUA will remain in effect (meaning this test can be used) for the duration of the COVID-19 declaration under Section 564(b)(1) of the Act, 21 U.S.C. section 360bbb-3(b)(1), unless the authorization is terminated or revoked.     Resp Syncytial Virus by PCR NEGATIVE NEGATIVE Final  Comment: (NOTE) Fact Sheet for Patients: BloggerCourse.com  Fact Sheet for Healthcare Providers: SeriousBroker.it  This test is not yet approved or cleared by the [place]  FDA and has been authorized for detection and/or diagnosis of SARS-CoV-2 by FDA under an Emergency Use Authorization (EUA). This EUA will remain in effect (meaning this test can be used) for the duration of the COVID-19 declaration under Section 564(b)(1) of the Act, 21 U.S.C. section 360bbb-3(b)(1), unless the authorization is terminated or revoked.  Performed at [facility], [address], [place], [place] [address]   MRSA Next Gen by PCR, Nasal     Status: None   Collection Time: 12/11/23 10:49 AM   Specimen: Nasal Mucosa; Nasal Swab   Result Value Ref Range Status   MRSA by PCR Next Gen NOT DETECTED NOT DETECTED Final    Comment: (NOTE) The GeneXpert MRSA Assay (FDA approved for NASAL specimens only), is one component of a comprehensive MRSA colonization surveillance program. It is not intended to diagnose MRSA infection nor to guide or monitor treatment for MRSA infections. Test performance is not FDA approved in patients less than 40 years old. Performed at [facility], [address] [place], [place] [address]          Radiology Studies: No results found.       Scheduled Meds:  allopurinol   100 mg Oral Daily   amLODipine   10 mg Oral Daily   enoxaparin  (LOVENOX ) injection  40 mg Subcutaneous Q24H   ezetimibe   10 mg Oral Daily   fluticasone  furoate-vilanterol  1 puff Inhalation Q2000   ipratropium-albuterol   3 mL Nebulization Q6H   methylPREDNISolone  (SOLU-MEDROL ) injection  80 mg Intravenous Daily   metoprolol  succinate  50 mg Oral Daily   montelukast   10 mg Oral QHS   roflumilast   500 mcg Oral Daily   sodium chloride  flush  3 mL Intravenous Q12H   Continuous Infusions:  ceFEPime  (MAXIPIME ) IV 2 g (12/14/23 1307)          Derryl Duval, MD [facility] 12/14/2023, 4:44 PM

## 2023-12-14 NOTE — Plan of Care (Signed)

## 2023-12-14 NOTE — Progress Notes (Signed)
 Patient placed on BIPAP for the evening  12/14/23 2058  Vent Select  Invasive or Noninvasive Noninvasive  Adult Vent Y  Adult Ventilator Settings  Vent Type Servo i  Humidity HME  Vent Mode BIPAP;PSV  FiO2 (%) 50 %  IPAP 10 cmH20  EPAP 5 cmH20  Pressure Support 5 cmH20  PEEP 5 cmH20  Adult Ventilator Measurements  Peak Airway Pressure 21 L/min  Mean Airway Pressure 18 cmH20  Resp Rate Spontaneous 15 br/min  Resp Rate Total 30 br/min  Exhaled Vt 927 mL  Measured Ve 16.2 L  I:E Ratio Measured 1:1.6  SpO2 93 %  Adult Ventilator Alarms  Alarms On Y

## 2023-12-15 DIAGNOSIS — J441 Chronic obstructive pulmonary disease with (acute) exacerbation: Secondary | ICD-10-CM | POA: Diagnosis not present

## 2023-12-15 NOTE — Progress Notes (Signed)
   12/15/23 1915  BiPAP/CPAP/SIPAP  $ Non-Invasive Ventilator  Non-Invasive Vent Subsequent  BiPAP/CPAP/SIPAP Pt Type Adult  BiPAP/CPAP/SIPAP SERVO  Mask Type Full face mask  Mask Size Large  Respiratory Rate 34 breaths/min  IPAP 10 cmH20  EPAP 5 cmH2O  FiO2 (%) 50 %  Minute Ventilation 19.1  Leak 40  Peak Inspiratory Pressure (PIP) 9  Tidal Volume (Vt) 784  Patient Home Machine No  Patient Home Mask No  Patient Home Tubing No  Auto Titrate No  Device Plugged into RED Power Outlet Yes  BiPAP/CPAP /SiPAP Vitals  Pulse Rate 83  Resp 17  SpO2 96 %  Bilateral Breath Sounds Clear;Diminished  MEWS Score/Color  MEWS Score 0  MEWS Score Color Green

## 2023-12-15 NOTE — Progress Notes (Signed)
 PROGRESS NOTE    Gregory Young.  FMW:[id number] DOB: 29-Sep-1960 DOA: 12/11/2023 PCP: Delbert Clam, MD   Brief Narrative:    Assessment & Plan:   Principal Problem:   COPD exacerbation [facility])   Assessment/Plan:   63 y.o. 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 He is not having significant cough/sputum, though has trouble with clearance chronically. Home O2 is 10L -> sometimes increases to 15L with activity. On EMS arrival was 80% on home O2. See treatment in ED course above, was placed on BiPAP. Resp distress improving. CXR stable from prior, does show hyperexpansion and mild interstitial thickening without focal infiltrate.  --Currently  [place] 10 L/min -9/4 Methylprednisolone  60 mg IV q12 hr decreased to 80 mg IV daily, will decrease to 40 mg daily tomorrow -Give Cefepime  2g IV q 8 hr for COPD exacerbation, I/s/o recent Abx exposures  -Check Flu / COVID / RSV, sputum culture -DuoNebs every 6 hours scheduled, albuterol  every 4 hours as needed, incentive spirometer, encourage out of bed to chair. --Continue his home Roflumilast , singulair , sub home symbicort -> breo ellipta . He prefers to bring in Spiriva  -Home O2 evaluation prior to discharge -Consider referral to pulmonary rehab  Diarrhea: Will check for C. difficile   Chronic medical problems:  Pulmonary nodules, mediastinal / hilar LAD, c/f malignancy  CTA in 4/'25 with irregular 2.1 cm lingular and 1 cm RUL nodule, hilar and mediastinal LAD which had increased findings concerning for potential malignancy -> Repeat CT Chest on 11/01/23 with regression of spiculated LUL /  lingular focus, Distortion superiorly / anteriorly 1.3 x 0.7 cm noted, and persistent nodular spiculation in posterior RUL 1 x 1.2 cm (previously 1.1 x 0.9 cm).  -- He is planned for PETCT as outpatient on 10/9 and will f/u with pulmonology after this study    Chronic medical problems:  HTN: Continue home Amlodipine , metoprolol  HLD:  CKD stage 2: Cr near baseline ~1 Gout: Continue home allopurinol   Former smoker: Noted, no active smoking    There is no height or weight on file to calculate BMI.     DVT prophylaxis:  Lovenox  Code Status:  DNR/DNI(Do NOT Intubate);   Family Communication: No family at the bedside Disposition Plan: Status is: Inpatient     Consultants:   Procedures:   Antimicrobials:    Subjective:  Patient seen and examined at the bedside.  He is sitting on edge of the bed.  Reports feeling gradual improvement in his symptoms.  Patient remains on his baseline oxygen need of 2 L/min.  However he states he does not feel back to his baseline.  Even slight activity worsen his breathing.  He is also reporting some diarrhea.  Denies any abdominal pain fever or chills.    Objective: Vitals:   12/15/23 0833 12/15/23 1122 12/15/23 1423 12/15/23 1534  BP:  122/87  121/77  Pulse:  80  92  Resp:  20  17  Temp:  98.2 F (36.8 C)  98.4 F (36.9 C)  TempSrc:  Oral  Oral  SpO2: 95% 96% 95% 94%  Weight:      Height:  Intake/Output Summary (Last 24 hours) at 12/15/2023 1604 Last data filed at 12/15/2023 1203 Gross per 24 hour  Intake 1098.56 ml  Output 3050 ml  Net -1951.44 ml   Filed Weights   12/12/23 0510 12/13/23 0600 12/15/23 0433  Weight: 80.9 kg 82 kg 79.9 kg    Examination:  General exam: Appears calm and comfortable  Respiratory system: Bilateral decreased breath sounds at bases Cardiovascular system: S1 & S2 heard, Rate controlled Gastrointestinal system: Abdomen is nondistended, soft and nontender. Normal bowel sounds heard. Extremities: No  cyanosis, clubbing, edema  Central nervous system: Alert and oriented. No focal neurological deficits. Moving extremities Skin: No rashes, lesions or ulcers Psychiatry: Judgement and insight appear normal. Mood & affect appropriate.     Data Reviewed: I have personally reviewed following labs and imaging studies  CBC: Recent Labs  Lab 12/11/23 0206 12/13/23 0906  WBC 15.0* 23.4*  NEUTROABS  --  20.6*  HGB 14.4 13.4  HCT 47.3 43.7  MCV 76.4* 75.7*  PLT 328 316   Basic Metabolic Panel: Recent Labs  Lab 12/11/23 0206 12/13/23 0906 12/14/23 0844  NA 135 137 137  K 3.7 5.9* 3.8  CL 96* 100 98  CO2 27 22 29   GLUCOSE 97 109* 186*  BUN 8 21 14   CREATININE 1.11 1.21 1.12  CALCIUM 9.8 9.4 9.4   GFR: Estimated Creatinine Clearance: 66.2 mL/min (by C-G formula based on SCr of 1.12 mg/dL). Liver Function Tests: No results for input(s): AST, ALT, ALKPHOS, BILITOT, PROT, ALBUMIN in the last 168 hours. No results for input(s): LIPASE, AMYLASE in the last 168 hours. No results for input(s): AMMONIA in the last 168 hours. Coagulation Profile: No results for input(s): INR, PROTIME in the last 168 hours. Cardiac Enzymes: No results for input(s): CKTOTAL, CKMB, CKMBINDEX, TROPONINI in the last 168 hours. BNP (last 3 results) No results for input(s): PROBNP in the last 8760 hours. HbA1C: No results for input(s): HGBA1C in the last 72 hours. CBG: No results for input(s): GLUCAP in the last 168 hours. Lipid Profile: No results for input(s): CHOL, HDL, LDLCALC, TRIG, CHOLHDL, LDLDIRECT in the last 72 hours. Thyroid Function Tests: No results for input(s): TSH, T4TOTAL, FREET4, T3FREE, THYROIDAB in the last 72 hours. Anemia Panel: No results for input(s): VITAMINB12, FOLATE, FERRITIN, TIBC, IRON, RETICCTPCT in the last 72 hours. Sepsis Labs: No results for input(s): PROCALCITON, LATICACIDVEN in the last 168  hours.  Recent Results (from the past 240 hours)  Resp panel by RT-PCR (RSV, Flu A&B, Covid) Anterior Nasal Swab     Status: None   Collection Time: 12/11/23  4:09 AM   Specimen: Anterior Nasal Swab  Result Value Ref Range Status   SARS Coronavirus 2 by RT PCR NEGATIVE NEGATIVE Final   Influenza A by PCR NEGATIVE NEGATIVE Final   Influenza B by PCR NEGATIVE NEGATIVE Final    Comment: (NOTE) The Xpert Xpress SARS-CoV-2/FLU/RSV plus assay is intended as an aid in the diagnosis of influenza from Nasopharyngeal swab specimens and should not be used as a sole basis for treatment. Nasal washings and aspirates are unacceptable for Xpert Xpress SARS-CoV-2/FLU/RSV testing.  Fact Sheet for Patients: BloggerCourse.com  Fact Sheet for Healthcare Providers: SeriousBroker.it  This test is not yet approved or cleared by the [place]  FDA and has been authorized for detection and/or diagnosis of SARS-CoV-2 by FDA under an Emergency Use Authorization (EUA). This EUA will remain in effect (meaning this test can be used) for the duration of the  COVID-19 declaration under Section 564(b)(1) of the Act, 21 U.S.C. section 360bbb-3(b)(1), unless the authorization is terminated or revoked.     Resp Syncytial Virus by PCR NEGATIVE NEGATIVE Final    Comment: (NOTE) Fact Sheet for Patients: BloggerCourse.com  Fact Sheet for Healthcare Providers: SeriousBroker.it  This test is not yet approved or cleared by the [place]  FDA and has been authorized for detection and/or diagnosis of SARS-CoV-2 by FDA under an Emergency Use Authorization (EUA). This EUA will remain in effect (meaning this test can be used) for the duration of the COVID-19 declaration under Section 564(b)(1) of the Act, 21 U.S.C. section 360bbb-3(b)(1), unless the authorization is terminated or revoked.  Performed at [facility], [address] [place], [place] [address]   MRSA Next Gen by PCR, Nasal     Status: None   Collection Time: 12/11/23 10:49 AM   Specimen: Nasal Mucosa; Nasal Swab  Result Value Ref Range Status   MRSA by PCR Next Gen NOT DETECTED NOT DETECTED Final    Comment: (NOTE) The GeneXpert MRSA Assay (FDA approved for NASAL specimens only), is one component of a comprehensive MRSA colonization surveillance program. It is not intended to diagnose MRSA infection nor to guide or monitor treatment for MRSA infections. Test performance is not FDA approved in patients less than 64 years old. Performed at [facility], [address] [place], [place] [address]          Radiology Studies: No results found.       Scheduled Meds:  allopurinol   100 mg Oral Daily   amLODipine   10 mg Oral Daily   enoxaparin  (LOVENOX ) injection  40 mg Subcutaneous Q24H   ezetimibe   10 mg Oral Daily   fluticasone  furoate-vilanterol  1 puff Inhalation Q2000   ipratropium-albuterol   3 mL Nebulization Q6H   methylPREDNISolone  (SOLU-MEDROL ) injection  80 mg Intravenous Daily   metoprolol  succinate  50 mg Oral Daily   montelukast   10 mg Oral QHS   roflumilast   500 mcg Oral Daily   sodium chloride  flush  3 mL Intravenous Q12H   Continuous Infusions:  ceFEPime  (MAXIPIME ) IV 2 g (12/15/23 1203)          Derryl Duval, MD [facility] 12/15/2023, 4:04 PM

## 2023-12-16 ENCOUNTER — Inpatient Hospital Stay (HOSPITAL_COMMUNITY)

## 2023-12-16 DIAGNOSIS — R0609 Other forms of dyspnea: Secondary | ICD-10-CM | POA: Diagnosis not present

## 2023-12-16 DIAGNOSIS — J441 Chronic obstructive pulmonary disease with (acute) exacerbation: Secondary | ICD-10-CM | POA: Diagnosis not present

## 2023-12-16 LAB — ECHOCARDIOGRAM COMPLETE
Area-P 1/2: 4.68 cm2
Height: 66 in
S' Lateral: 3.1 cm
Single Plane A4C EF: 54.4 %
Weight: 2920.65 [oz_av]

## 2023-12-16 MED ORDER — METHYLPREDNISOLONE SODIUM SUCC 40 MG IJ SOLR
40.0000 mg | Freq: Every day | INTRAMUSCULAR | Status: DC
  Administered 2023-12-16: 40 mg via INTRAVENOUS
  Filled 2023-12-16: qty 1

## 2023-12-16 NOTE — Plan of Care (Signed)

## 2023-12-16 NOTE — Progress Notes (Signed)
  Echocardiogram 2D Echocardiogram has been performed.  Gregory Young 12/16/2023, 1:25 PM

## 2023-12-16 NOTE — Progress Notes (Signed)
 PROGRESS NOTE    Gregory Young.  FMW:[id number] DOB: [birth year] DOA: 12/11/2023 PCP: Delbert Clam, MD   Brief Narrative:    Assessment & Plan:   Principal Problem:   COPD exacerbation [name])   Assessment/Plan:   63 y.o. 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 He is not having significant cough/sputum, though has trouble with clearance chronically. Home O2 is 10L -> sometimes increases to 15L with activity. On EMS arrival was 80% on home O2. See treatment in ED course above, was placed on BiPAP. Resp distress improving. CXR stable from prior, does show hyperexpansion and mild interstitial thickening without focal infiltrate.  --Currently  [place] 10 L/min -9/4 Methylprednisolone  60 mg IV q12 hr decreased to 80 mg IV daily,  -9/7 IV Solu-Medrol  40 mg daily. -Give Cefepime  2g IV q 8 hr for COPD exacerbation, I/s/o recent Abx exposures  -Check Flu / COVID / RSV, sputum culture -DuoNebs every 6 hours scheduled, albuterol  every 4 hours as needed, incentive spirometer, encourage out of bed to chair. --Continue his home Roflumilast , singulair , sub home symbicort -> breo ellipta . He prefers to bring in Spiriva  -Home O2 evaluation prior to discharge -Consider referral to pulmonary rehab - Anticipate discharge in next 24 to 48 hours  Diarrhea: Does not meet criteria for C. difficile 6.  Will continue Imodium  as needed  Leukocytosis: Likely from steroid use.  Will monitor  Chronic medical problems:  Pulmonary nodules, mediastinal / hilar LAD, c/f malignancy  CTA in 4/'25 with irregular 2.1 cm lingular and 1 cm RUL nodule, hilar and  mediastinal LAD which had increased findings concerning for potential malignancy -> Repeat CT Chest on 11/01/23 with regression of spiculated LUL / lingular focus, Distortion superiorly / anteriorly 1.3 x 0.7 cm noted, and persistent nodular spiculation in posterior RUL 1 x 1.2 cm (previously 1.1 x 0.9 cm).  -- He is planned for PETCT as outpatient on 10/9 and will f/u with pulmonology after this study    Chronic medical problems:  HTN: Continue home Amlodipine , metoprolol  HLD:  CKD stage 2: Cr near baseline ~1 Gout: Continue home allopurinol   Former smoker: Noted, no active smoking    There is no height or weight on file to calculate BMI.     DVT prophylaxis:  Lovenox  Code Status:  DNR/DNI(Do NOT Intubate);   Family Communication: No family at the bedside Disposition Plan: Status is: Inpatient     Consultants:   Procedures:   Antimicrobials:    Subjective:  Patient seen and examined at the bedside.  He is sitting on edge of the bed.  Reports feeling gradual improvement in his symptoms.  Patient remains on his baseline oxygen need of 10 L/min.  However he states he does not feel back to his baseline.  Even slight activity worsen his breathing.  Reports ongoing diarrhea but formed stool, does not meet criteria for C. difficile.  Denies any abdominal pain fever or chills.    Objective: Vitals:   12/16/23 0350 12/16/23 0506 12/16/23 0722 12/16/23 0754  BP: 128/84   130/76  Pulse: 76  75 96  Resp: (!) 21 16 18  20  Temp: 98.2 F (36.8 C)   97.8 F (36.6 C)  TempSrc: Oral   Axillary  SpO2: 97% 93%  96%  Weight:  82.8 kg    Height:        Intake/Output Summary (Last 24 hours) at 12/16/2023 1055 Last data filed at 12/16/2023 0349 Gross per 24 hour  Intake 220 ml  Output 1900 ml  Net -1680 ml   Filed Weights   12/13/23 0600 12/15/23 0433 12/16/23 0506  Weight: 82 kg 79.9 kg 82.8 kg    Examination:  General exam: Appears calm and comfortable  Respiratory system:  Bilateral decreased breath sounds at bases Cardiovascular system: S1 & S2 heard, Rate controlled Gastrointestinal system: Abdomen is nondistended, soft and nontender. Normal bowel sounds heard. Extremities: No cyanosis, clubbing, edema  Central nervous system: Alert and oriented. No focal neurological deficits. Moving extremities Skin: No rashes, lesions or ulcers Psychiatry: Judgement and insight appear normal. Mood & affect appropriate.     Data Reviewed: I have personally reviewed following labs and imaging studies  CBC: Recent Labs  Lab 12/11/23 0206 12/13/23 0906  WBC 15.0* 23.4*  NEUTROABS  --  20.6*  HGB 14.4 13.4  HCT 47.3 43.7  MCV 76.4* 75.7*  PLT 328 316   Basic Metabolic Panel: Recent Labs  Lab 12/11/23 0206 12/13/23 0906 12/14/23 0844  NA 135 137 137  K 3.7 5.9* 3.8  CL 96* 100 98  CO2 27 22 29   GLUCOSE 97 109* 186*  BUN 8 21 14   CREATININE 1.11 1.21 1.12  CALCIUM 9.8 9.4 9.4   GFR: Estimated Creatinine Clearance: 67.3 mL/min (by C-G formula based on SCr of 1.12 mg/dL). Liver Function Tests: No results for input(s): AST, ALT, ALKPHOS, BILITOT, PROT, ALBUMIN in the last 168 hours. No results for input(s): LIPASE, AMYLASE in the last 168 hours. No results for input(s): AMMONIA in the last 168 hours. Coagulation Profile: No results for input(s): INR, PROTIME in the last 168 hours. Cardiac Enzymes: No results for input(s): CKTOTAL, CKMB, CKMBINDEX, TROPONINI in the last 168 hours. BNP (last 3 results) No results for input(s): PROBNP in the last 8760 hours. HbA1C: No results for input(s): HGBA1C in the last 72 hours. CBG: No results for input(s): GLUCAP in the last 168 hours. Lipid Profile: No results for input(s): CHOL, HDL, LDLCALC, TRIG, CHOLHDL, LDLDIRECT in the last 72 hours. Thyroid Function Tests: No results for input(s): TSH, T4TOTAL, FREET4, T3FREE, THYROIDAB in the last 72  hours. Anemia Panel: No results for input(s): VITAMINB12, FOLATE, FERRITIN, TIBC, IRON, RETICCTPCT in the last 72 hours. Sepsis Labs: No results for input(s): PROCALCITON, LATICACIDVEN in the last 168 hours.  Recent Results (from the past 240 hours)  Resp panel by RT-PCR (RSV, Flu A&B, Covid) Anterior Nasal Swab     Status: None   Collection Time: 12/11/23  4:09 AM   Specimen: Anterior Nasal Swab  Result Value Ref Range Status   SARS Coronavirus 2 by RT PCR NEGATIVE NEGATIVE Final   Influenza A by PCR NEGATIVE NEGATIVE Final   Influenza B by PCR NEGATIVE NEGATIVE Final    Comment: (NOTE) The Xpert Xpress SARS-CoV-2/FLU/RSV plus assay is intended as an aid in the diagnosis of influenza from Nasopharyngeal swab specimens and should not be used as a sole basis for treatment. Nasal washings and aspirates are unacceptable for Xpert Xpress SARS-CoV-2/FLU/RSV testing.  Fact Sheet for Patients: BloggerCourse.com  Fact Sheet for Healthcare Providers: SeriousBroker.it  This test is not yet approved or cleared by  the [place]  FDA and has been authorized for detection and/or diagnosis of SARS-CoV-2 by FDA under an Emergency Use Authorization (EUA). This EUA will remain in effect (meaning this test can be used) for the duration of the COVID-19 declaration under Section 564(b)(1) of the Act, 21 U.S.C. section 360bbb-3(b)(1), unless the authorization is terminated or revoked.     Resp Syncytial Virus by PCR NEGATIVE NEGATIVE Final    Comment: (NOTE) Fact Sheet for Patients: BloggerCourse.com  Fact Sheet for Healthcare Providers: SeriousBroker.it  This test is not yet approved or cleared by the [place]  FDA and has been authorized for detection and/or diagnosis of SARS-CoV-2 by FDA under an Emergency Use Authorization (EUA). This EUA will remain in effect (meaning  this test can be used) for the duration of the COVID-19 declaration under Section 564(b)(1) of the Act, 21 U.S.C. section 360bbb-3(b)(1), unless the authorization is terminated or revoked.  Performed at [facility], [address] [place], [place] [address]   MRSA Next Gen by PCR, Nasal     Status: None   Collection Time: 12/11/23 10:49 AM   Specimen: Nasal Mucosa; Nasal Swab  Result Value Ref Range Status   MRSA by PCR Next Gen NOT DETECTED NOT DETECTED Final    Comment: (NOTE) The GeneXpert MRSA Assay (FDA approved for NASAL specimens only), is one component of a comprehensive MRSA colonization surveillance program. It is not intended to diagnose MRSA infection nor to guide or monitor treatment for MRSA infections. Test performance is not FDA approved in patients less than 70 years old. Performed at [facility], [address] [place], [place] [address]          Radiology Studies: No results found.       Scheduled Meds:  allopurinol   100 mg Oral Daily   amLODipine   10 mg Oral Daily   enoxaparin  (LOVENOX ) injection  40 mg Subcutaneous Q24H   ezetimibe   10 mg Oral Daily   fluticasone  furoate-vilanterol  1 puff Inhalation Q2000   ipratropium-albuterol   3 mL Nebulization Q6H   methylPREDNISolone  (SOLU-MEDROL ) injection  40 mg Intravenous Daily   metoprolol  succinate  50 mg Oral Daily   montelukast   10 mg Oral QHS   roflumilast   500 mcg Oral Daily   sodium chloride  flush  3 mL Intravenous Q12H   Continuous Infusions:  ceFEPime  (MAXIPIME ) IV 2 g (12/16/23 0348)          Derryl Duval, MD [facility] 12/16/2023, 10:55 AM

## 2023-12-17 ENCOUNTER — Other Ambulatory Visit: Payer: Self-pay

## 2023-12-17 DIAGNOSIS — J441 Chronic obstructive pulmonary disease with (acute) exacerbation: Secondary | ICD-10-CM | POA: Diagnosis not present

## 2023-12-17 MED ORDER — PREDNISONE 20 MG PO TABS
40.0000 mg | ORAL_TABLET | Freq: Every day | ORAL | Status: DC
  Administered 2023-12-17 – 2023-12-18 (×2): 40 mg via ORAL
  Filled 2023-12-17 (×2): qty 2

## 2023-12-17 MED ORDER — ORAL CARE MOUTH RINSE: 15.0000 mL | OROMUCOSAL | Status: DC | PRN

## 2023-12-17 NOTE — Plan of Care (Signed)

## 2023-12-17 NOTE — Evaluation (Signed)
 Physical Therapy Evaluation Patient Details Name: Gregory Young. MRN: [id number] DOB: [birth year] Today's Date: 12/17/2023  History of Present Illness  Pt is a 63 y.o. male admitted 9/2 with COPD exacerbation. 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 PTA pt lived at home with his 2 daughters, mod I short household distances with rollator vs cane. Pt is on 10L O2 at baseline. On eval, pt demo mod I bed mobility. Supervision provided for transfers and amb 14' with RW. Initiated gait on 10L with SpO2 92%. Desat to 87%. O2 increased to 12L with return to 92%. Pt with very slow guarded gait due to respiratory status. He appears to be at/near his baseline for mobility. No further skilled PT intervention indicated. Will defer to mobility team for further amb if pt remains inpatient. PT signing off.         If plan is discharge home, recommend the following: A little help with bathing/dressing/bathroom;A little help with walking and/or transfers;Assistance with cooking/housework;Assist for transportation   Can travel by private vehicle        Equipment Recommendations None recommended by PT  Recommendations for Other Services       Functional Status Assessment Patient has not had a recent decline in their functional status     Precautions / Restrictions Precautions Precautions: Other (comment) Recall of Precautions/Restrictions: Intact Precaution/Restrictions Comments: watch sats      Mobility  Bed Mobility Overal bed mobility: Modified Independent                  Transfers Overall transfer level: Needs assistance Equipment used: Rolling walker (2 wheels) Transfers: Sit to/from Stand Sit to Stand: Supervision                Ambulation/Gait Ambulation/Gait assistance:  Supervision Gait Distance (Feet): 50 Feet Assistive device: Rolling walker (2 wheels) Gait Pattern/deviations: Step-through pattern, Decreased stride length, Trunk flexed Gait velocity: slow, guarded gait due to respiratory status     General Gait Details: Initiated gait on 10L with SpO2 92%. Desat to 87%. O2 increased to 12L as well as standing rest break to recover to 92%. Back to 10L in room at rest.  Stairs            Wheelchair Mobility     Tilt Bed    Modified Rankin (Stroke Patients Only)       Balance Overall balance assessment: Needs assistance Sitting-balance support: No upper extremity supported, Feet supported Sitting balance-Leahy Scale: Good     Standing balance support: Bilateral upper extremity supported, During functional activity, Reliant on assistive device for balance Standing balance-Leahy Scale: Poor                               Pertinent Vitals/Pain Pain Assessment Pain Assessment: No/denies pain    Home Living Family/patient expects to be discharged to:: Private residence Living Arrangements: Children Available Help at Discharge: Family;Available 24 hours/day Type of Home: House Home Access: Level entry       Home Layout: One level Home Equipment: Rollator (4 wheels);Cane - single point;Wheelchair - manual;Other (comment) (home O2) Additional Comments: Pt on 10L O2 at baseline.    Prior Function Prior Level of Function : Needs assist  Mobility Comments: amb short household distances with rollator vs cane ADLs Comments: mod I basic ADLs. Daughters do cooking, Education officer, environmental, and grocery shopping.     Extremity/Trunk Assessment   Upper Extremity Assessment Upper Extremity Assessment: Overall WFL for tasks assessed    Lower Extremity Assessment Lower Extremity Assessment: Overall WFL for tasks assessed    Cervical / Trunk Assessment Cervical / Trunk Assessment: Kyphotic  Communication    Communication Communication: No apparent difficulties    Cognition Arousal: Alert Behavior During Therapy: WFL for tasks assessed/performed   PT - Cognitive impairments: No apparent impairments                         Following commands: Intact       Cueing Cueing Techniques: Verbal cues     General Comments      Exercises     Assessment/Plan    PT Assessment Patient does not need any further PT services  PT Problem List         PT Treatment Interventions      PT Goals (Current goals can be found in the Care Plan section)  Acute Rehab PT Goals Patient Stated Goal: not stated PT Goal Formulation: All assessment and education complete, DC therapy    Frequency       Co-evaluation               AM-PAC PT 6 Clicks Mobility  Outcome Measure Help needed turning from your back to your side while in a flat bed without using bedrails?: None Help needed moving from lying on your back to sitting on the side of a flat bed without using bedrails?: None Help needed moving to and from a bed to a chair (including a wheelchair)?: A Little Help needed standing up from a chair using your arms (e.g., wheelchair or bedside chair)?: A Little Help needed to walk in hospital room?: A Little Help needed climbing 3-5 steps with a railing? : A Little 6 Click Score: 20    End of Session Equipment Utilized During Treatment: Oxygen Activity Tolerance: Patient tolerated treatment well Patient left: in bed;with call bell/phone within reach (sitting EOB) Nurse Communication: Mobility status PT Visit Diagnosis: Difficulty in walking, not elsewhere classified (R26.2)    Time: 8895-8876 PT Time Calculation (min) (ACUTE ONLY): 19 min   Charges:   PT Evaluation $PT Eval Moderate Complexity: 1 Mod   PT General Charges $$ ACUTE PT VISIT: 1 Visit         Sari MATSU., PT  Office # [phone]   Erven Sari Shaker 12/17/2023, 11:41 AM

## 2023-12-17 NOTE — Progress Notes (Signed)
 PROGRESS NOTE    [name]  FMW:[id number] DOB: [birth year] DOA: 12/11/2023 PCP: Delbert Clam, MD   Brief Narrative:    Assessment & Plan:   Principal Problem:   COPD exacerbation [facility])   Assessment/Plan:   63 y.o. 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 He is not having significant cough/sputum, though has trouble with clearance chronically. Home O2 is 10L -> sometimes increases to 15L with activity. On EMS arrival was 80% on home O2. See treatment in ED course above, was placed on BiPAP. Resp distress improving. CXR stable from prior, does show hyperexpansion and mild interstitial thickening without focal infiltrate.  --Currently  [place] 10 L/min -9/4 Methylprednisolone  60 mg IV q12 hr decreased to 80 mg IV daily,  -9/7 IV Solu-Medrol  40 mg daily. -Switch steroid to p.o. prednisone  40 mg daily.  Will consider short taper on discharge -Give Cefepime  2g IV q 8 hr for COPD exacerbation, I/s/o recent Abx exposures.  Will complete 7 days of antibiotic therapy.  -PT eval. - Flu / COVID / RSV, negative. -DuoNebs every 6 hours scheduled, albuterol  every 4 hours as needed, incentive spirometer, encourage out of bed to chair. --Continue his home Roflumilast , singulair , sub home symbicort -> breo ellipta . He prefers to bring in Spiriva  -Home O2 evaluation prior to discharge -Consider referral to pulmonary rehab - Anticipate discharge in next 24 to 48 hours  Diarrhea: Does not meet criteria for C. difficile 6.  Will continue Imodium  as needed  Leukocytosis: Likely from steroid use.  Will monitor  Chronic medical problems:   Pulmonary nodules, mediastinal / hilar LAD, c/f malignancy  CTA in 4/'25 with irregular 2.1 cm lingular and 1 cm RUL nodule, hilar and mediastinal LAD which had increased findings concerning for potential malignancy -> Repeat CT Chest on 11/01/23 with regression of spiculated LUL / lingular focus, Distortion superiorly / anteriorly 1.3 x 0.7 cm noted, and persistent nodular spiculation in posterior RUL 1 x 1.2 cm (previously 1.1 x 0.9 cm).  -- He is planned for PETCT as outpatient on 10/9 and will f/u with pulmonology after this study    Chronic medical problems:  HTN: Continue home Amlodipine , metoprolol  HLD:  CKD stage 2: Cr near baseline ~1 Gout: Continue home allopurinol   Former smoker: Noted, no active smoking    There is no height or weight on file to calculate BMI.     DVT prophylaxis:  Lovenox  Code Status:  DNR/DNI(Do NOT Intubate);   Family Communication: No family at the bedside Disposition Plan: Status is: Inpatient     Consultants:   Procedures:   Antimicrobials:    Subjective:  Patient seen and examined at the bedside.  He is sitting on edge of the bed.  No acute issues overnight.  Continues to have some loose stools.  Does not feel short of breath at rest.  Feels with activity.  Overall feels improving but not quite to baseline, on 10 L/min oxygen at rest.  Objective: Vitals:   12/17/23 0811 12/17/23 0829 12/17/23 1030 12/17/23 1257  BP: 129/77 127/78 125/86   Pulse:  90  86  Resp: 13  15  Temp: 98.7 F (37.1 C)  97.8 F (36.6 C)   TempSrc: Oral  Oral   SpO2: 94%  97%   Weight:      Height:        Intake/Output Summary (Last 24 hours) at 12/17/2023 1332 Last data filed at 12/17/2023 0700 Gross per 24 hour  Intake 1200 ml  Output 2000 ml  Net -800 ml   Filed Weights   12/15/23 0433 12/16/23 0506 12/17/23 0500  Weight: 79.9 kg 82.8 kg 80.8 kg    Examination:  General exam: Appears calm and comfortable  Respiratory system: Bilateral decreased  breath sounds at bases Cardiovascular system: S1 & S2 heard, Rate controlled Gastrointestinal system: Abdomen is nondistended, soft and nontender. Normal bowel sounds heard. Extremities: No cyanosis, clubbing, edema  Central nervous system: Alert and oriented. No focal neurological deficits. Moving extremities Skin: No rashes, lesions or ulcers Psychiatry: Judgement and insight appear normal. Mood & affect appropriate.     Data Reviewed: I have personally reviewed following labs and imaging studies  CBC: Recent Labs  Lab 12/11/23 0206 12/13/23 0906  WBC 15.0* 23.4*  NEUTROABS  --  20.6*  HGB 14.4 13.4  HCT 47.3 43.7  MCV 76.4* 75.7*  PLT 328 316   Basic Metabolic Panel: Recent Labs  Lab 12/11/23 0206 12/13/23 0906 12/14/23 0844  NA 135 137 137  K 3.7 5.9* 3.8  CL 96* 100 98  CO2 27 22 29   GLUCOSE 97 109* 186*  BUN 8 21 14   CREATININE 1.11 1.21 1.12  CALCIUM 9.8 9.4 9.4   GFR: Estimated Creatinine Clearance: 66.5 mL/min (by C-G formula based on SCr of 1.12 mg/dL). Liver Function Tests: No results for input(s): AST, ALT, ALKPHOS, BILITOT, PROT, ALBUMIN in the last 168 hours. No results for input(s): LIPASE, AMYLASE in the last 168 hours. No results for input(s): AMMONIA in the last 168 hours. Coagulation Profile: No results for input(s): INR, PROTIME in the last 168 hours. Cardiac Enzymes: No results for input(s): CKTOTAL, CKMB, CKMBINDEX, TROPONINI in the last 168 hours. BNP (last 3 results) No results for input(s): PROBNP in the last 8760 hours. HbA1C: No results for input(s): HGBA1C in the last 72 hours. CBG: No results for input(s): GLUCAP in the last 168 hours. Lipid Profile: No results for input(s): CHOL, HDL, LDLCALC, TRIG, CHOLHDL, LDLDIRECT in the last 72 hours. Thyroid Function Tests: No results for input(s): TSH, T4TOTAL, FREET4, T3FREE, THYROIDAB in the last 72 hours. Anemia Panel: No  results for input(s): VITAMINB12, FOLATE, FERRITIN, TIBC, IRON, RETICCTPCT in the last 72 hours. Sepsis Labs: No results for input(s): PROCALCITON, LATICACIDVEN in the last 168 hours.  Recent Results (from the past 240 hours)  Resp panel by RT-PCR (RSV, Flu A&B, Covid) Anterior Nasal Swab     Status: None   Collection Time: 12/11/23  4:09 AM   Specimen: Anterior Nasal Swab  Result Value Ref Range Status   SARS Coronavirus 2 by RT PCR NEGATIVE NEGATIVE Final   Influenza A by PCR NEGATIVE NEGATIVE Final   Influenza B by PCR NEGATIVE NEGATIVE Final    Comment: (NOTE) The Xpert Xpress SARS-CoV-2/FLU/RSV plus assay is intended as an aid in the diagnosis of influenza from Nasopharyngeal swab specimens and should not be used as a sole basis for treatment. Nasal washings and aspirates are unacceptable for Xpert Xpress SARS-CoV-2/FLU/RSV testing.  Fact Sheet for Patients: BloggerCourse.com  Fact Sheet for Healthcare Providers: SeriousBroker.it  This test is not yet approved or cleared by the  [place]  FDA and has been authorized for detection and/or diagnosis of SARS-CoV-2 by FDA under an Emergency Use Authorization (EUA). This EUA will remain in effect (meaning this test can be used) for the duration of the COVID-19 declaration under Section 564(b)(1) of the Act, 21 U.S.C. section 360bbb-3(b)(1), unless the authorization is terminated or revoked.     Resp Syncytial Virus by PCR NEGATIVE NEGATIVE Final    Comment: (NOTE) Fact Sheet for Patients: BloggerCourse.com  Fact Sheet for Healthcare Providers: SeriousBroker.it  This test is not yet approved or cleared by the [place]  FDA and has been authorized for detection and/or diagnosis of SARS-CoV-2 by FDA under an Emergency Use Authorization (EUA). This EUA will remain in effect (meaning this test can be used)  for the duration of the COVID-19 declaration under Section 564(b)(1) of the Act, 21 U.S.C. section 360bbb-3(b)(1), unless the authorization is terminated or revoked.  Performed at [facility], [address] [place], [place] [address]   MRSA Next Gen by PCR, Nasal     Status: None   Collection Time: 12/11/23 10:49 AM   Specimen: Nasal Mucosa; Nasal Swab  Result Value Ref Range Status   MRSA by PCR Next Gen NOT DETECTED NOT DETECTED Final    Comment: (NOTE) The GeneXpert MRSA Assay (FDA approved for NASAL specimens only), is one component of a comprehensive MRSA colonization surveillance program. It is not intended to diagnose MRSA infection nor to guide or monitor treatment for MRSA infections. Test performance is not FDA approved in patients less than 52 years old. Performed at [facility], [address], [place], [place] [address]          Radiology Studies: ECHOCARDIOGRAM COMPLETE Result Date: 12/16/2023    ECHOCARDIOGRAM REPORT   Patient Name:   Levoy Geisen. Date of Exam: 12/16/2023 Medical Rec #:  [id number]       Height:       66.0 in Accession #:    [id number]      Weight:       182.5 lb Date of Birth:  06-18-1960       BSA:          1.924 m Patient Age:    62 years        BP:           128/86 mmHg Patient Gender: M               HR:           82 bpm. Exam Location:  Inpatient Procedure: 2D Echo (Both Spectral and Color Flow Doppler were utilized during            procedure). Indications:    dyspnea  History:        Patient has no prior history of Echocardiogram examinations.                 COPD and chronic kidney disease; Risk Factors:Hypertension.  Sonographer:    Tinnie Barefoot RDCS Referring Phys: [id number] [facility] Sherrye Puga  Sonographer Comments: Image acquisition challenging due to COPD and Image acquisition challenging due to respiratory motion. IMPRESSIONS  1. Left ventricular ejection fraction, by estimation, is 55 to 60%. The left ventricle has normal  function. Left ventricular endocardial border not optimally defined to evaluate regional wall motion. Left ventricular diastolic parameters are consistent with Grade I diastolic dysfunction (impaired relaxation).  2. Right ventricular systolic function is moderately reduced. The right ventricular size is moderately  enlarged. There is normal pulmonary artery systolic pressure.  3. The mitral valve is normal in structure. Trivial mitral valve regurgitation. No evidence of mitral stenosis.  4. The tricuspid valve is abnormal. Tricuspid valve regurgitation is mild to moderate.  5. The aortic valve is tricuspid. Aortic valve regurgitation is not visualized. No aortic stenosis is present.  6. The inferior vena cava is normal in size with greater than 50% respiratory variability, suggesting right atrial pressure of 3 mmHg. Comparison(s): No prior Echocardiogram. FINDINGS  Left Ventricle: Left ventricular ejection fraction, by estimation, is 55 to 60%. The left ventricle has normal function. Left ventricular endocardial border not optimally defined to evaluate regional wall motion. Strain was performed and the global longitudinal strain is indeterminate. The left ventricular internal cavity size was normal in size. There is no left ventricular hypertrophy. Left ventricular diastolic parameters are consistent with Grade I diastolic dysfunction (impaired relaxation). Normal left ventricular filling pressure. Right Ventricle: The right ventricular size is moderately enlarged. No increase in right ventricular wall thickness. Right ventricular systolic function is moderately reduced. There is normal pulmonary artery systolic pressure. The tricuspid regurgitant velocity is 2.76 m/s, and with an assumed right atrial pressure of 3 mmHg, the estimated right ventricular systolic pressure is 33.5 mmHg. Left Atrium: Left atrial size was normal in size. Right Atrium: Right atrial size was normal in size. Pericardium: There is no  evidence of pericardial effusion. Mitral Valve: The mitral valve is normal in structure. Trivial mitral valve regurgitation. No evidence of mitral valve stenosis. Tricuspid Valve: The tricuspid valve is abnormal. Tricuspid valve regurgitation is mild to moderate. No evidence of tricuspid stenosis. Aortic Valve: The aortic valve is tricuspid. Aortic valve regurgitation is not visualized. No aortic stenosis is present. Pulmonic Valve: The pulmonic valve was normal in structure. Pulmonic valve regurgitation is not visualized. No evidence of pulmonic stenosis. Aorta: The aortic root and ascending aorta are structurally normal, with no evidence of dilitation. Venous: The inferior vena cava is normal in size with greater than 50% respiratory variability, suggesting right atrial pressure of 3 mmHg. IAS/Shunts: No atrial level shunt detected by color flow Doppler. Additional Comments: 3D was performed not requiring image post processing on an independent workstation and was indeterminate. A device lead is visualized.  LEFT VENTRICLE PLAX 2D LVIDd:         4.90 cm     Diastology LVIDs:         3.10 cm     LV e' medial:    7.40 cm/s LV PW:         1.00 cm     LV E/e' medial:  6.9 LV IVS:        1.10 cm     LV e' lateral:   11.00 cm/s LVOT diam:     2.20 cm     LV E/e' lateral: 4.6 LV SV:         72 LV SV Index:   38 LVOT Area:     3.80 cm  LV Volumes (MOD) LV vol d, MOD A4C: 93.9 ml LV vol s, MOD A4C: 42.8 ml LV SV MOD A4C:     93.9 ml RIGHT VENTRICLE          IVC RV Basal diam:  2.90 cm  IVC diam: 1.90 cm TAPSE (M-mode): 1.8 cm LEFT ATRIUM             Index        RIGHT ATRIUM  Index LA diam:        3.40 cm 1.77 cm/m   RA Area:     15.30 cm LA Vol (A2C):   43.4 ml 22.56 ml/m  RA Volume:   37.70 ml  19.60 ml/m LA Vol (A4C):   33.4 ml 17.36 ml/m LA Biplane Vol: 39.0 ml 20.27 ml/m  AORTIC VALVE LVOT Vmax:   117.00 cm/s LVOT Vmean:  70.300 cm/s LVOT VTI:    0.190 m  AORTA Ao Root diam: 3.30 cm MITRAL VALVE                TRICUSPID VALVE MV Area (PHT): 4.68 cm    TR Peak grad:   30.5 mmHg MV Decel Time: 162 msec    TR Vmax:        276.00 cm/s MV E velocity: 50.90 cm/s MV A velocity: 69.80 cm/s  SHUNTS MV E/A ratio:  0.73        Systemic VTI:  0.19 m                            Systemic Diam: 2.20 cm Vishnu Priya Mallipeddi Electronically signed by Diannah Late Mallipeddi Signature Date/Time: 12/16/2023/3:42:46 PM    Final          Scheduled Meds:  allopurinol   100 mg Oral Daily   amLODipine   10 mg Oral Daily   enoxaparin  (LOVENOX ) injection  40 mg Subcutaneous Q24H   ezetimibe   10 mg Oral Daily   fluticasone  furoate-vilanterol  1 puff Inhalation Q2000   ipratropium-albuterol   3 mL Nebulization Q6H   metoprolol  succinate  50 mg Oral Daily   montelukast   10 mg Oral QHS   predniSONE   40 mg Oral Q breakfast   roflumilast   500 mcg Oral Daily   sodium chloride  flush  3 mL Intravenous Q12H   Continuous Infusions:  ceFEPime  (MAXIPIME ) IV 2 g (12/17/23 1137)          Derryl Duval, MD [facility] 12/17/2023, 1:32 PM

## 2023-12-18 ENCOUNTER — Other Ambulatory Visit (HOSPITAL_COMMUNITY): Payer: Self-pay

## 2023-12-18 MED ORDER — PREDNISONE 10 MG PO TABS
ORAL_TABLET | ORAL | 0 refills | Status: DC
  Filled 2023-12-18: qty 30, 12d supply, fill #0

## 2023-12-18 NOTE — Plan of Care (Signed)

## 2023-12-18 NOTE — TOC Transition Note (Signed)
 Transition of Care [facility]) - Discharge Note   Patient Details  Name: Gregory Young. MRN: [id number] Date of Birth: 1960/06/27  Transition of Care [facility]) CM/SW Contact:  Roxie KANDICE Stain, RN Phone Number: 12/18/2023, 12:11 PM   Clinical Narrative:    Gregory Nechama Raddle. is stable to discharge home. Follow up apt on AVS. Family will bring patient's portable 02 tanks. No ICM (Inpatient Care Management) needs at this time.    Final next level of care: Home/Self Care Barriers to Discharge: Barriers Resolved   Patient Goals and CMS Choice Patient states their goals for this hospitalization and ongoing recovery are:: return home          Discharge Placement       home                Discharge Plan and Services Additional resources added to the After Visit Summary for                                       Social Drivers of Health (SDOH) Interventions SDOH Screenings   Food Insecurity: No Food Insecurity (12/11/2023)  Housing: Low Risk  (12/11/2023)  Transportation Needs: Unmet Transportation Needs (12/11/2023)  Utilities: Not At Risk (12/11/2023)  Social Connections: Moderately Isolated (12/11/2023)  Tobacco Use: Medium Risk (12/13/2023)     Readmission Risk Interventions    12/18/2023   12:11 PM  Readmission Risk Prevention Plan  Post Dischage Appt Complete  Medication Screening Complete  Transportation Screening Complete

## 2023-12-18 NOTE — Discharge Summary (Signed)
 Physician Discharge Summary  Duplicate DUPLICATE FMW:[id number] DOB: [birth year] DOA: 12/11/2023  PCP: Delbert Clam, MD  Admit date: 12/11/2023 Discharge date: 12/18/2023  Admitted From: Home Disposition: Home  Recommendations for Outpatient Follow-up:  Follow up with PCP in 1 week with repeat CBC/BMP Follow up in ED if symptoms worsen or new appear   Discharge Condition: Stable CODE STATUS: Full Diet recommendation: Heart healthy  Brief/Interim Summary: 63 y.o. 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  Patient received 7 days of IV cefepime  for presumed bacterial exacerbation.  He also received IV Solu-Medrol  initially transition to prednisone .  Will be discharged on 10 days taper.  He remained on baseline oxygen 10 L/min for several days in the hospital prior to DC   COPD exacerbation  Acute on chronic hypoxic respiratory failure  P/w ~ 4 days worsening SOB. He is not having significant cough/sputum, though has trouble with clearance chronically. Home O2 is 10L -> sometimes increases to 15L with activity. On EMS arrival was 80% on home O2. See treatment in ED course above, was placed on BiPAP. Resp distress improving. CXR stable from prior, does show hyperexpansion and mild interstitial thickening without focal infiltrate.  -Received IV Solu-Medrol  for several days, transition to oral prednisone .  On discharge he was prescribed taper of prednisone  40 mg daily for 3 days followed by 30 mg daily for 3 days followed by 20 mg daily for 3 days followed by 10 mg daily for 3 days then stop. --- Flu / COVID / RSV, negative. -DuoNebs every 6 hours scheduled, albuterol  every 4 hours as needed, incentive spirometer, encourage out of bed to  chair. --received Roflumilast , singulair , sub home symbicort -> breo ellipta . -Home O2 evaluation prior to discharge   Diarrhea: Does not meet criteria for C. difficile 6.  Will continue Imodium  as needed   Leukocytosis: Likely from steroid use.  Will monitor   Chronic medical problems:  Pulmonary nodules, mediastinal / hilar LAD, c/f malignancy  CTA in 4/'25 with irregular 2.1 cm lingular and 1 cm RUL nodule, hilar and mediastinal LAD which had increased findings concerning for potential malignancy -> Repeat CT Chest on 11/01/23 with regression of spiculated LUL / lingular focus, Distortion superiorly / anteriorly 1.3 x 0.7 cm noted, and persistent nodular spiculation in posterior RUL 1 x 1.2 cm (previously 1.1 x 0.9 cm).  -- He is planned for PETCT as outpatient on 10/9 and will f/u with pulmonology after this study    Chronic medical problems:  HTN: Continue home Amlodipine , metoprolol  HLD:  CKD stage 2: Cr near baseline ~1 Gout: Continue home allopurinol   Former smoker: Noted, no active smoking   Discharge Diagnoses:  Principal Problem:   COPD exacerbation (HCC)    Discharge Instructions  Discharge Instructions     Call MD for:  difficulty breathing, headache or visual disturbances   Complete by: As directed    Call MD for:  persistant nausea and vomiting   Complete by: As directed    Call MD for:  temperature >100.4   Complete by: As directed    Diet - low sodium heart healthy   Complete by: As directed    Discharge instructions   Complete by: As directed    1.  Continue  home medications as you have been taking prior to admission. 2.  Start prednisone  taper.  40 mg daily for 3 days followed by 30 mg daily for 3 days followed by 20 mg daily for 3 days followed by 10 mg daily for 3 days then stop.   Increase activity slowly   Complete by: As directed       Allergies as of 12/18/2023       Reactions   Shellfish Allergy Anaphylaxis   Atorvastatin Itching, Swelling    Beef-derived Drug Products Hives, Other (See Comments)   Causes gout    Colchicine Hives        Medication List     TAKE these medications    albuterol  (2.5 MG/3ML) 0.083% nebulizer solution Commonly known as: PROVENTIL  Take 2.5 mg by nebulization daily as needed for wheezing or shortness of breath.   albuterol  108 (90 Base) MCG/ACT inhaler Commonly known as: VENTOLIN  HFA Inhale 2 puffs into the lungs in the morning and at bedtime.   allopurinol  100 MG tablet Commonly known as: ZYLOPRIM  Take 100 mg by mouth daily.   amLODipine  10 MG tablet Commonly known as: NORVASC  Take 10 mg by mouth daily.   budesonide-formoterol 160-4.5 MCG/ACT inhaler Commonly known as: SYMBICORT Inhale 2 puffs into the lungs 2 (two) times daily.   ezetimibe  10 MG tablet Commonly known as: ZETIA  Take 10 mg by mouth daily.   ibuprofen 200 MG tablet Commonly known as: ADVIL Take 800 mg by mouth daily as needed for mild pain (pain score 1-3) or moderate pain (pain score 4-6).   metoprolol  succinate 50 MG 24 hr tablet Commonly known as: TOPROL -XL Take 50 mg by mouth daily.   montelukast  10 MG tablet Commonly known as: SINGULAIR  Take 10 mg by mouth at bedtime.   predniSONE  10 MG tablet Commonly known as: DELTASONE  Take 4 tablets daily for 3 days followed by 3 tablets daily for 3 days followed by 2 tablets daily for 3 days then 1 tablet daily for 3 days then stop.   roflumilast  500 MCG Tabs tablet Commonly known as: DALIRESP  Take 250-500 mcg by mouth daily. Take 250 mcg bu mouth once daily for 30 days. Then take 500 mcg by mouth once daily.   tiotropium 18 MCG inhalation capsule Commonly known as: SPIRIVA Place 18 mcg into inhaler and inhale daily.   Vitamin D (Ergocalciferol) 1.25 MG (50000 UNIT) Caps capsule Commonly known as: DRISDOL Take 50,000 Units by mouth every 7 (seven) days.        Follow-up Information     Delbert Clam, MD Follow up in 1 week(s).   Specialty: Family  Medicine Contact information: [address] [place] [address] [phone]                Allergies  Allergen Reactions   Shellfish Allergy Anaphylaxis   Atorvastatin Itching and Swelling   Beef-Derived Drug Products Hives and Other (See Comments)    Causes gout    Colchicine Hives    Consultations:    Procedures/Studies: ECHOCARDIOGRAM COMPLETE Result Date: 12/16/2023    ECHOCARDIOGRAM REPORT   Patient Name:   Gregory Young. Date of Exam: 12/16/2023 Medical Rec #:  [id number]       Height:       66.0 in Accession #:    [id number]      Weight:       182.5 lb Date of Birth:  1960-07-01       BSA:  1.924 m Patient Age:    62 years        BP:           128/86 mmHg Patient Gender: M               HR:           82 bpm. Exam Location:  Inpatient Procedure: 2D Echo (Both Spectral and Color Flow Doppler were utilized during            procedure). Indications:    dyspnea  History:        Patient has no prior history of Echocardiogram examinations.                 COPD and chronic kidney disease; Risk Factors:Hypertension.  Sonographer:    Tinnie Barefoot RDCS Referring Phys: [id number] [facility] Bertina Guthridge  Sonographer Comments: Image acquisition challenging due to COPD and Image acquisition challenging due to respiratory motion. IMPRESSIONS  1. Left ventricular ejection fraction, by estimation, is 55 to 60%. The left ventricle has normal function. Left ventricular endocardial border not optimally defined to evaluate regional wall motion. Left ventricular diastolic parameters are consistent with Grade I diastolic dysfunction (impaired relaxation).  2. Right ventricular systolic function is moderately reduced. The right ventricular size is moderately enlarged. There is normal pulmonary artery systolic pressure.  3. The mitral valve is normal in structure. Trivial mitral valve regurgitation. No evidence of mitral stenosis.  4. The tricuspid valve is abnormal. Tricuspid valve  regurgitation is mild to moderate.  5. The aortic valve is tricuspid. Aortic valve regurgitation is not visualized. No aortic stenosis is present.  6. The inferior vena cava is normal in size with greater than 50% respiratory variability, suggesting right atrial pressure of 3 mmHg. Comparison(s): No prior Echocardiogram. FINDINGS  Left Ventricle: Left ventricular ejection fraction, by estimation, is 55 to 60%. The left ventricle has normal function. Left ventricular endocardial border not optimally defined to evaluate regional wall motion. Strain was performed and the global longitudinal strain is indeterminate. The left ventricular internal cavity size was normal in size. There is no left ventricular hypertrophy. Left ventricular diastolic parameters are consistent with Grade I diastolic dysfunction (impaired relaxation). Normal left ventricular filling pressure. Right Ventricle: The right ventricular size is moderately enlarged. No increase in right ventricular wall thickness. Right ventricular systolic function is moderately reduced. There is normal pulmonary artery systolic pressure. The tricuspid regurgitant velocity is 2.76 m/s, and with an assumed right atrial pressure of 3 mmHg, the estimated right ventricular systolic pressure is 33.5 mmHg. Left Atrium: Left atrial size was normal in size. Right Atrium: Right atrial size was normal in size. Pericardium: There is no evidence of pericardial effusion. Mitral Valve: The mitral valve is normal in structure. Trivial mitral valve regurgitation. No evidence of mitral valve stenosis. Tricuspid Valve: The tricuspid valve is abnormal. Tricuspid valve regurgitation is mild to moderate. No evidence of tricuspid stenosis. Aortic Valve: The aortic valve is tricuspid. Aortic valve regurgitation is not visualized. No aortic stenosis is present. Pulmonic Valve: The pulmonic valve was normal in structure. Pulmonic valve regurgitation is not visualized. No evidence of pulmonic  stenosis. Aorta: The aortic root and ascending aorta are structurally normal, with no evidence of dilitation. Venous: The inferior vena cava is normal in size with greater than 50% respiratory variability, suggesting right atrial pressure of 3 mmHg. IAS/Shunts: No atrial level shunt detected by color flow Doppler. Additional Comments: 3D was performed not requiring image post processing on  an independent workstation and was indeterminate. A device lead is visualized.  LEFT VENTRICLE PLAX 2D LVIDd:         4.90 cm     Diastology LVIDs:         3.10 cm     LV e' medial:    7.40 cm/s LV PW:         1.00 cm     LV E/e' medial:  6.9 LV IVS:        1.10 cm     LV e' lateral:   11.00 cm/s LVOT diam:     2.20 cm     LV E/e' lateral: 4.6 LV SV:         72 LV SV Index:   38 LVOT Area:     3.80 cm  LV Volumes (MOD) LV vol d, MOD A4C: 93.9 ml LV vol s, MOD A4C: 42.8 ml LV SV MOD A4C:     93.9 ml RIGHT VENTRICLE          IVC RV Basal diam:  2.90 cm  IVC diam: 1.90 cm TAPSE (M-mode): 1.8 cm LEFT ATRIUM             Index        RIGHT ATRIUM           Index LA diam:        3.40 cm 1.77 cm/m   RA Area:     15.30 cm LA Vol (A2C):   43.4 ml 22.56 ml/m  RA Volume:   37.70 ml  19.60 ml/m LA Vol (A4C):   33.4 ml 17.36 ml/m LA Biplane Vol: 39.0 ml 20.27 ml/m  AORTIC VALVE LVOT Vmax:   117.00 cm/s LVOT Vmean:  70.300 cm/s LVOT VTI:    0.190 m  AORTA Ao Root diam: 3.30 cm MITRAL VALVE               TRICUSPID VALVE MV Area (PHT): 4.68 cm    TR Peak grad:   30.5 mmHg MV Decel Time: 162 msec    TR Vmax:        276.00 cm/s MV E velocity: 50.90 cm/s MV A velocity: 69.80 cm/s  SHUNTS MV E/A ratio:  0.73        Systemic VTI:  0.19 m                            Systemic Diam: 2.20 cm Vishnu Priya Mallipeddi Electronically signed by Diannah Late Mallipeddi Signature Date/Time: 12/16/2023/3:42:46 PM    Final    DG Chest Port 1 View Result Date: 12/11/2023 CLINICAL DATA:  Shortness of breath EXAM: PORTABLE CHEST 1 VIEW COMPARISON:  10/31/2023  FINDINGS: Cardiac shadow is stable. Lungs are hyperinflated with emphysematous change. Chronic blunting of the costophrenic angles is seen. Mild interstitial thickening is noted without focal infiltrate. No acute bony abnormality is noted. IMPRESSION: Stable appearance of the chest when compared with the prior exam. Electronically Signed   By: Oneil Devonshire M.D.   On: 12/11/2023 02:34      Subjective:   Discharge Exam: Vitals:   12/18/23 0904 12/18/23 1048  BP: 127/82 133/81  Pulse: 92   Resp:  17  Temp:  97.8 F (36.6 C)  SpO2:  94%    General: Pt is alert, awake, not in acute distress Cardiovascular: rate controlled, S1/S2 + Respiratory: bilateral decreased breath sounds at bases Abdominal: Soft, NT, ND, bowel sounds +  Extremities: no edema, no cyanosis    The results of significant diagnostics from this hospitalization (including imaging, microbiology, ancillary and laboratory) are listed below for reference.     Microbiology: Recent Results (from the past 240 hours)  Resp panel by RT-PCR (RSV, Flu A&B, Covid) Anterior Nasal Swab     Status: None   Collection Time: 12/11/23  4:09 AM   Specimen: Anterior Nasal Swab  Result Value Ref Range Status   SARS Coronavirus 2 by RT PCR NEGATIVE NEGATIVE Final   Influenza A by PCR NEGATIVE NEGATIVE Final   Influenza B by PCR NEGATIVE NEGATIVE Final    Comment: (NOTE) The Xpert Xpress SARS-CoV-2/FLU/RSV plus assay is intended as an aid in the diagnosis of influenza from Nasopharyngeal swab specimens and should not be used as a sole basis for treatment. Nasal washings and aspirates are unacceptable for Xpert Xpress SARS-CoV-2/FLU/RSV testing.  Fact Sheet for Patients: BloggerCourse.com  Fact Sheet for Healthcare Providers: SeriousBroker.it  This test is not yet approved or cleared by the [place]  FDA and has been authorized for detection and/or diagnosis of SARS-CoV-2  by FDA under an Emergency Use Authorization (EUA). This EUA will remain in effect (meaning this test can be used) for the duration of the COVID-19 declaration under Section 564(b)(1) of the Act, 21 U.S.C. section 360bbb-3(b)(1), unless the authorization is terminated or revoked.     Resp Syncytial Virus by PCR NEGATIVE NEGATIVE Final    Comment: (NOTE) Fact Sheet for Patients: BloggerCourse.com  Fact Sheet for Healthcare Providers: SeriousBroker.it  This test is not yet approved or cleared by the [place]  FDA and has been authorized for detection and/or diagnosis of SARS-CoV-2 by FDA under an Emergency Use Authorization (EUA). This EUA will remain in effect (meaning this test can be used) for the duration of the COVID-19 declaration under Section 564(b)(1) of the Act, 21 U.S.C. section 360bbb-3(b)(1), unless the authorization is terminated or revoked.  Performed at [facility], [address], [place], [place] [address]   MRSA Next Gen by PCR, Nasal     Status: None   Collection Time: 12/11/23 10:49 AM   Specimen: Nasal Mucosa; Nasal Swab  Result Value Ref Range Status   MRSA by PCR Next Gen NOT DETECTED NOT DETECTED Final    Comment: (NOTE) The GeneXpert MRSA Assay (FDA approved for NASAL specimens only), is one component of a comprehensive MRSA colonization surveillance program. It is not intended to diagnose MRSA infection nor to guide or monitor treatment for MRSA infections. Test performance is not FDA approved in patients less than 33 years old. Performed at [facility], [address], [place], [place] [address]      Labs: BNP (last 3 results) Recent Labs    12/11/23 0206  BNP 51.3   Basic Metabolic Panel: Recent Labs  Lab 12/13/23 0906 12/14/23 0844  NA 137 137  K 5.9* 3.8  CL 100 98  CO2 22 29  GLUCOSE 109* 186*  BUN 21 14  CREATININE 1.21 1.12  CALCIUM 9.4 9.4   Liver Function  Tests: No results for input(s): AST, ALT, ALKPHOS, BILITOT, PROT, ALBUMIN in the last 168 hours. No results for input(s): LIPASE, AMYLASE in the last 168 hours. No results for input(s): AMMONIA in the last 168 hours. CBC: Recent Labs  Lab 12/13/23 0906  WBC 23.4*  NEUTROABS 20.6*  HGB 13.4  HCT 43.7  MCV 75.7*  PLT 316   Cardiac Enzymes: No results for input(s):  CKTOTAL, CKMB, CKMBINDEX, TROPONINI in the last 168 hours. BNP: Invalid input(s): POCBNP CBG: No results for input(s): GLUCAP in the last 168 hours. D-Dimer No results for input(s): DDIMER in the last 72 hours. Hgb A1c No results for input(s): HGBA1C in the last 72 hours. Lipid Profile No results for input(s): CHOL, HDL, LDLCALC, TRIG, CHOLHDL, LDLDIRECT in the last 72 hours. Thyroid function studies No results for input(s): TSH, T4TOTAL, T3FREE, THYROIDAB in the last 72 hours.  Invalid input(s): FREET3 Anemia work up No results for input(s): VITAMINB12, FOLATE, FERRITIN, TIBC, IRON, RETICCTPCT in the last 72 hours. Urinalysis No results found for: COLORURINE, APPEARANCEUR, LABSPEC, PHURINE, GLUCOSEU, HGBUR, BILIRUBINUR, KETONESUR, PROTEINUR, UROBILINOGEN, NITRITE, LEUKOCYTESUR Sepsis Labs Recent Labs  Lab 12/13/23 0906  WBC 23.4*   Microbiology Recent Results (from the past 240 hours)  Resp panel by RT-PCR (RSV, Flu A&B, Covid) Anterior Nasal Swab     Status: None   Collection Time: 12/11/23  4:09 AM   Specimen: Anterior Nasal Swab  Result Value Ref Range Status   SARS Coronavirus 2 by RT PCR NEGATIVE NEGATIVE Final   Influenza A by PCR NEGATIVE NEGATIVE Final   Influenza B by PCR NEGATIVE NEGATIVE Final    Comment: (NOTE) The Xpert Xpress SARS-CoV-2/FLU/RSV plus assay is intended as an aid in the diagnosis of influenza from Nasopharyngeal swab specimens and should not be used as a sole basis for treatment. Nasal  washings and aspirates are unacceptable for Xpert Xpress SARS-CoV-2/FLU/RSV testing.  Fact Sheet for Patients: BloggerCourse.com  Fact Sheet for Healthcare Providers: SeriousBroker.it  This test is not yet approved or cleared by the [place]  FDA and has been authorized for detection and/or diagnosis of SARS-CoV-2 by FDA under an Emergency Use Authorization (EUA). This EUA will remain in effect (meaning this test can be used) for the duration of the COVID-19 declaration under Section 564(b)(1) of the Act, 21 U.S.C. section 360bbb-3(b)(1), unless the authorization is terminated or revoked.     Resp Syncytial Virus by PCR NEGATIVE NEGATIVE Final    Comment: (NOTE) Fact Sheet for Patients: BloggerCourse.com  Fact Sheet for Healthcare Providers: SeriousBroker.it  This test is not yet approved or cleared by the [place]  FDA and has been authorized for detection and/or diagnosis of SARS-CoV-2 by FDA under an Emergency Use Authorization (EUA). This EUA will remain in effect (meaning this test can be used) for the duration of the COVID-19 declaration under Section 564(b)(1) of the Act, 21 U.S.C. section 360bbb-3(b)(1), unless the authorization is terminated or revoked.  Performed at [facility], [address], [place], [place] [address]   MRSA Next Gen by PCR, Nasal     Status: None   Collection Time: 12/11/23 10:49 AM   Specimen: Nasal Mucosa; Nasal Swab  Result Value Ref Range Status   MRSA by PCR Next Gen NOT DETECTED NOT DETECTED Final    Comment: (NOTE) The GeneXpert MRSA Assay (FDA approved for NASAL specimens only), is one component of a comprehensive MRSA colonization surveillance program. It is not intended to diagnose MRSA infection nor to guide or monitor treatment for MRSA infections. Test performance is not FDA approved in patients less than 26  years old. Performed at [facility], [address] [place], [place] [address]      Time coordinating discharge: 35 minutes  SIGNED:   Derryl Duval, MD  [facility] 12/18/2023, 5:45 PM

## 2023-12-18 NOTE — Telephone Encounter (Signed)
 Called PT to set up APT PT said he would call us  back to set up a APT when he is out of the hospital. NFN

## 2023-12-19 ENCOUNTER — Other Ambulatory Visit: Payer: Self-pay | Admitting: Family Medicine

## 2023-12-19 ENCOUNTER — Telehealth: Payer: Self-pay | Admitting: Family Medicine

## 2023-12-19 ENCOUNTER — Other Ambulatory Visit: Payer: Self-pay

## 2023-12-19 ENCOUNTER — Encounter (HOSPITAL_COMMUNITY): Payer: Self-pay | Admitting: Internal Medicine

## 2023-12-19 ENCOUNTER — Telehealth: Payer: Self-pay | Admitting: *Deleted

## 2023-12-19 DIAGNOSIS — J449 Chronic obstructive pulmonary disease, unspecified: Secondary | ICD-10-CM

## 2023-12-19 MED ORDER — ALBUTEROL SULFATE (2.5 MG/3ML) 0.083% IN NEBU
2.5000 mg | INHALATION_SOLUTION | Freq: Four times a day (QID) | RESPIRATORY_TRACT | 0 refills | Status: DC | PRN
  Filled 2023-12-19: qty 360, 30d supply, fill #0

## 2023-12-19 MED ORDER — TIOTROPIUM BROMIDE MONOHYDRATE 18 MCG IN CAPS
1.0000 | ORAL_CAPSULE | Freq: Every day | RESPIRATORY_TRACT | 1 refills | Status: AC
  Filled 2023-12-19: qty 30, 30d supply, fill #0
  Filled 2024-01-14: qty 30, 30d supply, fill #1

## 2023-12-19 MED FILL — Roflumilast Tab 500 MCG: ORAL | 30 days supply | Qty: 30 | Fill #0 | Status: AC

## 2023-12-19 NOTE — Telephone Encounter (Signed)
 Refills has been sent to pharmacy and pt has been informed.

## 2023-12-19 NOTE — Telephone Encounter (Unsigned)
 Copied from CRM [id number]. Topic: Clinical - Prescription Issue >> Dec 19, 2023  1:57 PM Tiffini S wrote: Reason for CRM: Patient called stating that he is out of the medications- refills was requested with the pharmacy last week/ the pharmacy faxed a request for the medication refills.   albuterol  (VENTOLIN  HFA) 108 (90 Base) MCG/ACT inhaler, tiotropium (SPIRIVA ) 18 MCG inhalation capsule, amLODipine  (NORVASC ) 10 MG tablet.  Patient is not sure what medications requested and did not have all the names of his medicine with him. Please call to follow up with the patient at [phone].

## 2023-12-19 NOTE — Transitions of Care (Post Inpatient/ED Visit) (Signed)
   12/19/2023  Name: [name] [name]. MRN: [id number] DOB: May 27, 1960  Today's TOC FU Call Status: Today's TOC FU Call Status:: Unsuccessful Call (1st Attempt) Unsuccessful Call (1st Attempt) Date: 12/19/23  Attempted to reach the patient regarding the most recent Inpatient/ED visit.  Patient request a call back later today or tomorrow.  Follow Up Plan: Additional outreach attempts will be made to reach the patient to complete the Transitions of Care (Post Inpatient/ED visit) call.   Andrea Dimes RN, BSN [place]  [facility] RN Care Manager [phone]

## 2023-12-20 ENCOUNTER — Other Ambulatory Visit: Payer: Self-pay

## 2023-12-20 ENCOUNTER — Telehealth: Payer: Self-pay | Admitting: *Deleted

## 2023-12-20 DIAGNOSIS — J449 Chronic obstructive pulmonary disease, unspecified: Secondary | ICD-10-CM | POA: Diagnosis not present

## 2023-12-20 NOTE — Transitions of Care (Post Inpatient/ED Visit) (Signed)
 12/20/2023  Name: [name] [name]. MRN: [id number] DOB: [birth year]  Today's TOC FU Call Status: Today's TOC FU Call Status:: Successful TOC FU Call Completed TOC FU Call Complete Date: 12/20/23 Patient's Name and Date of Birth confirmed.  Transition Care Management Follow-up Telephone Call Date of Discharge: 12/18/23 Discharge Facility: [facility]) Type of Discharge: Inpatient Admission Primary Inpatient Discharge Diagnosis:: COPD exacerbation How have you been since you were released from the hospital?: Better Any questions or concerns?: No  Items Reviewed: Did you receive and understand the discharge instructions provided?: Yes Medications obtained,verified, and reconciled?: Yes (Medications Reviewed) Any new allergies since your discharge?: No Dietary orders reviewed?: Yes Type of Diet Ordered:: low sodium heart healthy Do you have support at home?: Yes People in Home [RPT]: child(ren), adult Name of Support/Comfort Primary Source: Dtr/Erica  Medications Reviewed Today: Medications Reviewed Today     Reviewed by Lucky Andrea LABOR, RN (Registered Nurse) on 12/20/23 at 1406  Med List Status: <None>   Medication Order Taking? Sig Documenting Provider Last Dose Status Informant  albuterol  (PROVENTIL ) (2.5 MG/3ML) 0.083% nebulizer solution [id number] Yes Take 2.5 mg by nebulization daily as needed for wheezing or shortness of breath. [provider]  Active Self, Pharmacy Records  albuterol  (PROVENTIL ) (2.5 MG/3ML) 0.083% nebulizer solution [id number] Yes TAKE 3 MLS (2.5 MG TOTAL) BY NEBULIZATION EVERY 6 (SIX) HOURS AS NEEDED FOR SHORTNESS OF BREATH. Newlin, Enobong, MD  Active   albuterol  (VENTOLIN  HFA) 108 (90 Base) MCG/ACT inhaler [id number] Yes Inhale 2 puffs into the lungs every 6 (six) hours as needed for wheezing or shortness of breath Danton Jon HERO, PA-C  Active Self, Pharmacy Records  albuterol  (VENTOLIN  HFA) 108 (90 Base) MCG/ACT inhaler [id number] Yes Inhale 2  puffs into the lungs in the morning and at bedtime. [provider]  Active Self, Pharmacy Records    Discontinued 07/03/11 1455 (Entry Error)   allopurinol  (ZYLOPRIM ) 100 MG tablet [id number] Yes Take 1 tablet (100 mg total) by mouth daily. Newlin, Enobong, MD  Active Self, Pharmacy Records  allopurinol  (ZYLOPRIM ) 100 MG tablet [id number] Yes Take 100 mg by mouth daily. [provider]  Active Self, Pharmacy Records  amLODipine  (NORVASC ) 10 MG tablet [id number] Yes Take 1 tablet (10 mg total) by mouth daily. Newlin, Enobong, MD  Active Self, Pharmacy Records  amLODipine  (NORVASC ) 10 MG tablet [id number] Yes Take 10 mg by mouth daily. [provider]  Active Self, Pharmacy Records  budesonide -formoterol  (SYMBICORT ) 160-4.5 MCG/ACT inhaler [id number] Yes Inhale 2 puffs into the lungs 2 (two) times daily. Mannam, Praveen, MD  Active Self, Pharmacy Records  budesonide -formoterol  (SYMBICORT ) 160-4.5 MCG/ACT inhaler [id number] Yes Inhale 2 puffs into the lungs 2 (two) times daily. [provider]  Active Self, Pharmacy Records  ezetimibe  (ZETIA ) 10 MG tablet [id number] Yes Take 1 tablet (10 mg total) by mouth daily. Newlin, Enobong, MD  Active   ezetimibe  (ZETIA ) 10 MG tablet [id number] Yes Take 10 mg by mouth daily. [provider]  Active Self, Pharmacy Records  ibuprofen  (ADVIL ) 200 MG tablet [id number] Yes Take 800 mg by mouth daily as needed for moderate pain (pain score 4-6) or headache. [provider]  Active Self, Pharmacy Records  ibuprofen  (ADVIL ) 200 MG tablet [id number] Yes Take 800 mg by mouth daily as needed for mild pain (pain score 1-3) or moderate pain (pain score 4-6). [provider]  Active Self, Pharmacy Records  metoprolol  succinate (TOPROL -XL) 50 MG 24 hr tablet [id number] Yes Take 1 tablet (  50 mg total) by mouth daily. Newlin, Enobong, MD  Active Self, Pharmacy Records  metoprolol  succinate (TOPROL -XL) 50 MG 24 hr tablet [id number]  Yes Take 50 mg by mouth daily. [provider]  Active Self, Pharmacy Records  montelukast  (SINGULAIR ) 10 MG tablet [id number] Yes Take 1 tablet (10 mg total) by mouth at bedtime. Newlin, Enobong, MD  Active Self, Pharmacy Records  montelukast  (SINGULAIR ) 10 MG tablet [id number] Yes Take 10 mg by mouth at bedtime. [provider]  Active Self, Pharmacy Records  OXYGEN  [id number] Yes Inhale 10 L into the lungs continuous. [provider]  Active   predniSONE  (DELTASONE ) 10 MG tablet [id number] Yes Take 4 tablets daily for 3 days followed by 3 tablets daily for 3 days followed by 2 tablets daily for 3 days then 1 tablet daily for 3 days then stop. Sigdel, Santosh, MD  Active   roflumilast  (DALIRESP ) 500 MCG TABS tablet [id number] Yes Take 0.5 tablets (250 mcg total) by mouth daily for 30 days, THEN 1 tablet (500 mcg total) daily. Mannam, Praveen, MD  Active   roflumilast  (DALIRESP ) 500 MCG TABS tablet [id number] Yes Take 250-500 mcg by mouth daily. Take 250 mcg bu mouth once daily for 30 days. Then take 500 mcg by mouth once daily. [provider]  Active Self, Pharmacy Records  tiotropium (SPIRIVA  HANDIHALER) 18 MCG inhalation capsule [id number] Yes PLACE 1 CAPSULE (18 MCG TOTAL) INTO INHALER AND INHALE DAILY. Newlin, Enobong, MD  Active   tiotropium (SPIRIVA ) 18 MCG inhalation capsule [id number] Yes Place 18 mcg into inhaler and inhale daily. [provider]  Active Self, Pharmacy Records  Vitamin D , Ergocalciferol , (DRISDOL ) 1.25 MG (50000 UNIT) CAPS capsule [id number] Yes Take 1 capsule (50,000 Units total) by mouth every 7 (seven) days. Newlin, Enobong, MD  Active   Vitamin D , Ergocalciferol , (DRISDOL ) 1.25 MG (50000 UNIT) CAPS capsule [id number]  Take 50,000 Units by mouth every 7 (seven) days.  Patient not taking: Reported on 12/12/2023   [provider]  Consider Medication Status and Discontinue Self, Pharmacy Records            Home Care and  Equipment/Supplies: Were Home Health Services Ordered?: No Any new equipment or medical supplies ordered?: No  Functional Questionnaire: Do you need assistance with bathing/showering or dressing?: No Do you need assistance with meal preparation?: No Do you need assistance with eating?: No Do you have difficulty maintaining continence: No Do you need assistance with getting out of bed/getting out of a chair/moving?: No Do you have difficulty managing or taking your medications?: No  Follow up appointments reviewed: PCP Follow-up appointment confirmed?: Yes Date of PCP follow-up appointment?: 12/26/23 Follow-up Provider: Dr. Newlin Specialist [facility] Follow-up appointment confirmed?: NA Do you need transportation to your follow-up appointment?: No (Dtr will transport patient to appointments) Do you understand care options if your condition(s) worsen?: Yes-patient verbalized understanding  SDOH Interventions Today    Flowsheet Row Most Recent Value  SDOH Interventions   Food Insecurity Interventions Intervention Not Indicated  Housing Interventions Intervention Not Indicated  Transportation Interventions Patient Declined  [Patient prefers his family to provide transportation. Declines public transportation]  Utilities Interventions Intervention Not Indicated    Goals Addressed             This Visit's Progress    VBCI Transitions of Care (TOC) Care Plan       Problems:  Recent Hospitalization for treatment of COPD Knowledge Deficit Related to COPD triggers   Goal:  Over  the next 30 days, the patient will not experience hospital readmission  Interventions:  Transitions of Care: Annual Eye Exam education provided regarding need for exam Doctor Visits  - discussed the importance of doctor visits Post discharge activity limitations prescribed by provider reviewed Reviewed Signs and symptoms of infection Reviewed upcoming appointments including: PCP hospital follow up on  12/26/23 and PET scan on 01/17/24 Discussed patient to follow up with Pulmonology after PET scan to review results Verified patient has oxygen  and using 10L continuously Medication review-discussed prednisone  taper-patient voiced understanding   COPD Interventions: Advised patient to engage in light exercise as tolerated 3-5 days a week to aid in the the management of COPD Advised patient to track and manage COPD triggers Advised patient to self assesses COPD action plan zone and make appointment with provider if in the yellow zone for 48 hours without improvement Assessed social determinant of health barriers Discussed the importance of adequate rest and management of fatigue with COPD Use of home oxygen    Patient Self Care Activities:  Attend all scheduled provider appointments Call provider office for new concerns or questions  Participate in Transition of Care Program/Attend TOC scheduled calls Take medications as prescribed    Plan:  Telephone follow up appointment with care management team member scheduled for:  12/27/23 at 1:30pm        Andrea Dimes RN, BSN [place]  [facility] RN Care Manager [phone]

## 2023-12-21 DIAGNOSIS — J449 Chronic obstructive pulmonary disease, unspecified: Secondary | ICD-10-CM | POA: Diagnosis not present

## 2023-12-26 ENCOUNTER — Ambulatory Visit: Attending: Family Medicine | Admitting: Family Medicine

## 2023-12-26 ENCOUNTER — Other Ambulatory Visit: Payer: Self-pay

## 2023-12-26 ENCOUNTER — Encounter: Payer: Self-pay | Admitting: Family Medicine

## 2023-12-26 VITALS — BP 128/78 | HR 86 | Ht 66.0 in

## 2023-12-26 DIAGNOSIS — R918 Other nonspecific abnormal finding of lung field: Secondary | ICD-10-CM

## 2023-12-26 DIAGNOSIS — J9621 Acute and chronic respiratory failure with hypoxia: Secondary | ICD-10-CM

## 2023-12-26 DIAGNOSIS — Z1211 Encounter for screening for malignant neoplasm of colon: Secondary | ICD-10-CM

## 2023-12-26 DIAGNOSIS — J441 Chronic obstructive pulmonary disease with (acute) exacerbation: Secondary | ICD-10-CM

## 2023-12-26 DIAGNOSIS — Z9981 Dependence on supplemental oxygen: Secondary | ICD-10-CM | POA: Diagnosis not present

## 2023-12-26 DIAGNOSIS — D72829 Elevated white blood cell count, unspecified: Secondary | ICD-10-CM

## 2023-12-26 MED ORDER — PNEUMOCOCCAL 20-VAL CONJ VACC 0.5 ML IM SUSY
0.5000 mL | PREFILLED_SYRINGE | Freq: Once | INTRAMUSCULAR | 0 refills | Status: AC
  Filled 2023-12-26: qty 0.5, 1d supply, fill #0

## 2023-12-26 NOTE — Progress Notes (Signed)
 Subjective:  Patient ID: [name] [name]., male    DOB: 1960-12-02  Age: 63 y.o. MRN: [id number]  CC: Hospitalization Follow-up (Needs letter for daughter to be caregiver)     Discussed the use of AI scribe software for clinical note transcription with the patient, who gave verbal consent to proceed.  History of Present Illness [name] [name]. 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 He is accompanied by [name], his daughter and caregiver.  He experienced significant dyspnea during his recent hospitalization for COPD exacerbation, requiring ten liters of oxygen . He uses two oxygen  machines to achieve this, but one malfunctioned, contributing to his symptoms patient.  Chest x-ray was negative for focal infiltrate but did reveal emphysematous changes, blunting of costophrenic angles and mild interstitial thickening.  RSV, COVID test were negative.  He was treated with IV Solu-Medrol  and subsequently transition to prednisone  at discharge. He is completing a prednisone  course and continues using roflumilast , Spiriva , Symbicort , and albuterol  inhaler and solution. His white blood cell count was elevated during hospitalization. He feels he is back to his baseline at the moment. He has not received a pneumonia vaccine or flu shot, expressing reluctance towards the flu shot.  A PET scan is scheduled for October 9th to follow up on lung nodules. He denies constipation and does not wish to undergo colon or prostate cancer screening.  His daughter who accompanies him is requesting a letter indicating she is his caregiver.  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 age of diagnosis    Healthy Son    Healthy Daughter     Social History   Socioeconomic History   Marital status: Legally Separated    Spouse name: Not on file   Number of children: 5    Years of education: 11   Highest education level: Not on file  Occupational History   Occupation: Unemployed   Tobacco Use   Smoking status: Former    Current packs/day: 0.00    Average packs/day: 1 pack/day for 45.0 years (45.0 ttl pk-yrs)    Types: Cigarettes    Start date: 73    Quit date: 2021    Years since quitting: 4.7    Passive exposure: Past   Smokeless tobacco: Never   Tobacco comments:    'Quitting  Vaping Use   Vaping status: Never Used  Substance and Sexual Activity   Alcohol use: Yes    Alcohol/week: 0.0 standard drinks of alcohol    Comment: occaisional beer - previously one beer daily, stopped 4 months   Drug use: No    Types: Cocaine  Comment: remote use   Sexual activity: Yes    Birth control/protection: Condom  Other Topics Concern   Not on file  Social History Narrative   ** Merged History Encounter **       Lives with daughter and grandkids x 2. 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  Smokes 1ppd since age 7 y.o  Drinks 2 times per week 40 oz beer. Denies liquor, wine. History of cocaine us    e. Denies IVDU Has an orange card  Sexually active with 1 partner uses protection        Social Drivers of Health   Financial Resource Strain: Not on file  Food Insecurity: No Food Insecurity (12/20/2023)   Hunger Vital Sign    Worried  About Running Out of Food in the Last Year: Never true    Ran Out of Food in the Last Year: Never true  Transportation Needs: Unmet Transportation Needs (12/20/2023)   PRAPARE - Administrator, Civil Service (Medical): Yes    Lack of Transportation (Non-Medical): No  Physical Activity: Not on file  Stress: Not on file  Social Connections: Moderately Isolated (12/11/2023)   Social Connection and Isolation Panel    Frequency of Communication with Friends and Family: More than three times a week    Frequency of Social Gatherings with Friends and Family: Once a week    Attends Religious Services: 1 to 4 times per year    Active Member of [employer] or Organizations: No    Attends Banker Meetings: Never    Marital Status: Widowed    Allergies  Allergen Reactions   Lipitor [Atorvastatin ] Itching and Swelling    Facial, tongue swelling   Shellfish Allergy Anaphylaxis   Shellfish Allergy Anaphylaxis   Atorvastatin  Itching and Swelling   Beef (Diagnostic) Other (See Comments)    Gout flares   Beef-Derived Drug Products Hives and Other (See Comments)    Causes gout    Colchicine  Hives   Colcrys  [Colchicine ] Hives    Outpatient Medications Prior to Visit  Medication Sig Dispense Refill   albuterol  (PROVENTIL ) (2.5 MG/3ML) 0.083% nebulizer solution Take 2.5 mg by nebulization daily as needed for wheezing or shortness of breath.     albuterol  (PROVENTIL ) (2.5 MG/3ML) 0.083% nebulizer solution TAKE 3 MLS (2.5 MG TOTAL) BY NEBULIZATION EVERY 6 (SIX) HOURS AS NEEDED FOR SHORTNESS OF BREATH. 360 mL 0   albuterol  (VENTOLIN  HFA) 108 (90 Base) MCG/ACT inhaler Inhale 2 puffs into the lungs every 6 (six) hours as needed for wheezing or shortness of breath 18 g 2   albuterol  (VENTOLIN  HFA) 108 (90 Base) MCG/ACT inhaler Inhale 2 puffs into the lungs in the morning and at bedtime.     allopurinol  (ZYLOPRIM ) 100 MG tablet Take 1 tablet (100 mg total) by mouth daily. 90 tablet 1    amLODipine  (NORVASC ) 10 MG tablet Take 1 tablet (10 mg total) by mouth daily. 90 tablet 1   budesonide -formoterol  (SYMBICORT ) 160-4.5 MCG/ACT inhaler Inhale 2 puffs into the lungs 2 (two) times daily. 10.2 g 5   ezetimibe  (ZETIA ) 10 MG tablet Take 1 tablet (10 mg total) by mouth daily. 90 tablet 1   ibuprofen  (ADVIL ) 200 MG tablet Take 800 mg by mouth daily as needed for moderate pain (pain score 4-6) or headache.     metoprolol  succinate (TOPROL -XL) 50 MG 24 hr tablet Take 1 tablet (50 mg total) by  mouth daily. 90 tablet 1   montelukast  (SINGULAIR ) 10 MG tablet Take 1 tablet (10 mg total) by mouth at bedtime. 90 tablet 1   OXYGEN  Inhale 10 L into the lungs continuous.     predniSONE  (DELTASONE ) 10 MG tablet Take 4 tablets daily for 3 days followed by 3 tablets daily for 3 days followed by 2 tablets daily for 3 days then 1 tablet daily for 3 days then stop. 30 tablet 0   roflumilast  (DALIRESP ) 500 MCG TABS tablet Take 0.5 tablets (250 mcg total) by mouth daily for 30 days, THEN 1 tablet (500 mcg total) daily. 45 tablet 0   tiotropium (SPIRIVA  HANDIHALER) 18 MCG inhalation capsule PLACE 1 CAPSULE (18 MCG TOTAL) INTO INHALER AND INHALE DAILY. 30 capsule 1   Vitamin D , Ergocalciferol , (DRISDOL ) 1.25 MG (50000 UNIT) CAPS capsule Take 1 capsule (50,000 Units total) by mouth every 7 (seven) days. 12 capsule 0   allopurinol  (ZYLOPRIM ) 100 MG tablet Take 100 mg by mouth daily.     amLODipine  (NORVASC ) 10 MG tablet Take 10 mg by mouth daily.     budesonide -formoterol  (SYMBICORT ) 160-4.5 MCG/ACT inhaler Inhale 2 puffs into the lungs 2 (two) times daily.     ezetimibe  (ZETIA ) 10 MG tablet Take 10 mg by mouth daily.     ibuprofen  (ADVIL ) 200 MG tablet Take 800 mg by mouth daily as needed for mild pain (pain score 1-3) or moderate pain (pain score 4-6).     metoprolol  succinate (TOPROL -XL) 50 MG 24 hr tablet Take 50 mg by mouth daily.     montelukast  (SINGULAIR ) 10 MG tablet Take 10 mg by mouth at bedtime.      roflumilast  (DALIRESP ) 500 MCG TABS tablet Take 250-500 mcg by mouth daily. Take 250 mcg bu mouth once daily for 30 days. Then take 500 mcg by mouth once daily.     tiotropium (SPIRIVA ) 18 MCG inhalation capsule Place 18 mcg into inhaler and inhale daily.     Vitamin D , Ergocalciferol , (DRISDOL ) 1.25 MG (50000 UNIT) CAPS capsule Take 50,000 Units by mouth every 7 (seven) days. (Patient not taking: Reported on 12/12/2023)     No facility-administered medications prior to visit.     ROS Review of Systems  Constitutional:  Negative for activity change and appetite change.  HENT:  Negative for sinus pressure and sore throat.   Respiratory:  Negative for chest tightness, shortness of breath and wheezing.   Cardiovascular:  Negative for chest pain and palpitations.  Gastrointestinal:  Negative for abdominal distention, abdominal pain and constipation.  Genitourinary: Negative.   Musculoskeletal: Negative.   Psychiatric/Behavioral:  Negative for behavioral problems and dysphoric mood.     Objective:  BP 128/78   Pulse 86   Ht 5' 6 (1.676 m)   SpO2 100%   BMI 28.72 kg/m      12/26/2023    2:09 PM 12/18/2023   10:48 AM 12/18/2023    9:04 AM  BP/Weight  Systolic BP 128 133 127  Diastolic BP 78 81 82      Physical Exam Constitutional:      Appearance: He is well-developed.  HENT:     Nose:     Comments: On oxygen  via nasal cannula Cardiovascular:     Rate and Rhythm: Normal rate.     Heart sounds: Normal heart sounds. No murmur heard. Pulmonary:     Effort: Pulmonary effort is normal.     Breath sounds: Normal breath sounds. No wheezing or rales.  Chest:     Chest wall: No tenderness.  Abdominal:     General: Bowel sounds are normal. There is no distension.     Palpations: Abdomen is soft. There is no mass.     Tenderness: There is no abdominal tenderness.  Musculoskeletal:        General: Normal range of motion.     Right lower leg: No edema.     Left lower leg: No edema.   Neurological:     Mental Status: He is alert and oriented to person, place, and time.  Psychiatric:        Mood and Affect: Mood normal.        Latest Ref Rng & Units 12/14/2023    8:44 AM 12/13/2023    9:06 AM 12/11/2023    2:06 AM  CMP  Glucose 70 - 99 mg/dL 813  890  97   BUN 8 - 23 mg/dL 14  21  8    Creatinine 0.61 - 1.24 mg/dL 8.87  8.78  8.88   Sodium 135 - 145 mmol/L 137  137  135   Potassium 3.5 - 5.1 mmol/L 3.8  5.9  3.7   Chloride 98 - 111 mmol/L 98  100  96   CO2 22 - 32 mmol/L 29  22  27    Calcium  8.9 - 10.3 mg/dL 9.4  9.4  9.8     Lipid Panel     Component Value Date/Time   CHOL 222 (H) 06/08/2020 1047   TRIG 76 06/08/2020 1047   HDL 48 06/08/2020 1047   CHOLHDL 4.6 06/08/2020 1047   CHOLHDL 4.1 10/26/2014 0429   VLDL 10 10/26/2014 0429   LDLCALC 161 (H) 06/08/2020 1047    CBC    Component Value Date/Time   WBC 23.4 (H) 12/13/2023 0906   RBC 5.77 12/13/2023 0906   HGB 13.4 12/13/2023 0906   HGB 12.6 (L) 05/25/2022 1043   HCT 43.7 12/13/2023 0906   HCT 40.5 05/25/2022 1043   PLT 316 12/13/2023 0906   PLT 235 05/25/2022 1043   MCV 75.7 (L) 12/13/2023 0906   MCV 78 (L) 05/25/2022 1043   MCH 23.2 (L) 12/13/2023 0906   MCHC 30.7 12/13/2023 0906   RDW 13.8 12/13/2023 0906   RDW 12.2 05/25/2022 1043   LYMPHSABS 1.4 12/13/2023 0906   LYMPHSABS 2.2 05/25/2022 1043   MONOABS 1.0 12/13/2023 0906   EOSABS 0.0 12/13/2023 0906   EOSABS 0.3 05/25/2022 1043   BASOSABS 0.0 12/13/2023 0906   BASOSABS 0.0 05/25/2022 1043    Lab Results  Component Value Date   HGBA1C 5.0 03/14/2022       Assessment & Plan Chronic obstructive pulmonary disease with acute exacerbation and acute and chronic respiratory failure with hypoxia Recent hospitalization for COPD exacerbation with acute and chronic respiratory failure. Currently on 10 liters of oxygen . Completing prednisone  course. Issues with oxygen  machine settings noted, requiring careful management to maintain  adequate oxygen  levels. - Complete prednisone  course. - Continue roflumilast , Spiriva , Symbicort . albuterol  inhaler, and albuterol  solution. - Ensure careful use of oxygen  machines to maintain 10 liters. - Keep upcoming pulmonologist appointment.  Pulmonary nodule Spiculated nodules concerning for small neoplasm from CT of 10/2023 - PET scan scheduled for October 9th for lung nodule follow-up.  Leukocytosis Elevated white blood cell count noted during recent hospitalization. Likely due to prednisone  use, but infection cannot be ruled out without further testing. - Order repeat blood work to monitor white blood cell count.  Healthcare maintenance He is at high risk for pneumonia, flu, RSV, and COVID. -PCV 20 prescription sent to his pharmacy - Discussed the importance of pneumonia and flu vaccines due to high risk from COPD. - Declined flu shot. - Discussed colon cancer screening; patient declined. - Discussed prostate cancer screening; patient declined.   No orders of the defined types were placed in this encounter.   Follow-up: Return in about 6 months (around 06/24/2024) for Chronic medical conditions.       Corrina Sabin, MD, FAAFP. [facility] [place], [place] [phone]   12/26/2023, 2:17 PM

## 2023-12-26 NOTE — Patient Instructions (Signed)
 VISIT SUMMARY:  You had a follow-up appointment today after your recent hospitalization for a COPD exacerbation. We discussed your current medications, oxygen  therapy, and upcoming tests. We also reviewed your general health and vaccinations.  YOUR PLAN:  -CHRONIC OBSTRUCTIVE PULMONARY DISEASE (COPD) WITH ACUTE EXACERBATION AND ACUTE AND CHRONIC RESPIRATORY FAILURE WITH HYPOXIA: COPD is a chronic lung disease that makes it hard to breathe. You recently had a worsening of your symptoms, which required hospitalization and increased oxygen . Continue your current medications, complete your prednisone  course, and carefully manage your oxygen  machines to maintain 10 liters. Keep your upcoming pulmonologist appointment and PET scan on October 9th.  -ELEVATED WHITE BLOOD CELL COUNT: An elevated white blood cell count can indicate an infection or inflammation. It was noted during your hospitalization and may be due to prednisone  use. We will repeat your blood work to monitor this.  -ADULT WELLNESS VISIT: During your wellness visit, we discussed the importance of vaccinations and screenings. You received a pneumonia vaccine but declined the flu shot, colon cancer screening, and prostate cancer screening.  INSTRUCTIONS:  Please complete your prednisone  course as prescribed. Continue using your current medications: roflumilast , Spiriva , Symbicort , and albuterol  inhaler and solution. Carefully manage your oxygen  machines to maintain 10 liters of oxygen . Attend your upcoming pulmonologist appointment and PET scan on October 9th. We will repeat your blood work to monitor your white blood cell count.

## 2023-12-27 ENCOUNTER — Other Ambulatory Visit: Payer: Self-pay | Admitting: *Deleted

## 2023-12-27 ENCOUNTER — Ambulatory Visit: Payer: Self-pay | Admitting: Family Medicine

## 2023-12-27 LAB — CBC WITH DIFFERENTIAL/PLATELET
Basophils Absolute: 0 x10E3/uL (ref 0.0–0.2)
Basos: 0 %
EOS (ABSOLUTE): 0 x10E3/uL (ref 0.0–0.4)
Eos: 0 %
Hematocrit: 46.4 % (ref 37.5–51.0)
Hemoglobin: 14 g/dL (ref 13.0–17.7)
Immature Grans (Abs): 0.4 x10E3/uL — ABNORMAL HIGH (ref 0.0–0.1)
Immature Granulocytes: 2 %
Lymphocytes Absolute: 0.8 x10E3/uL (ref 0.7–3.1)
Lymphs: 4 %
MCH: 23.4 pg — ABNORMAL LOW (ref 26.6–33.0)
MCHC: 30.2 g/dL — ABNORMAL LOW (ref 31.5–35.7)
MCV: 78 fL — ABNORMAL LOW (ref 79–97)
Monocytes Absolute: 0.3 x10E3/uL (ref 0.1–0.9)
Monocytes: 1 %
Neutrophils Absolute: 18.4 x10E3/uL — ABNORMAL HIGH (ref 1.4–7.0)
Neutrophils: 93 %
Platelets: 297 x10E3/uL (ref 150–450)
RBC: 5.98 x10E6/uL — ABNORMAL HIGH (ref 4.14–5.80)
RDW: 13.6 % (ref 11.6–15.4)
WBC: 19.8 x10E3/uL — ABNORMAL HIGH (ref 3.4–10.8)

## 2023-12-27 NOTE — Patient Instructions (Signed)
 Visit Information  Thank you for taking time to visit with me today. Please don't hesitate to contact me if I can be of assistance to you before our next scheduled telephone appointment.   Following is a copy of your care plan:   Goals Addressed             This Visit's Progress    VBCI Transitions of Care (TOC) Care Plan       Problems:  Recent Hospitalization for treatment of COPD Knowledge Deficit Related to COPD triggers   Goal:  Over the next 30 days, the patient will not experience hospital readmission  Interventions:  Transitions of Care: Annual Eye Exam education provided regarding need for exam Doctor Visits  - discussed the importance of doctor visits Post discharge activity limitations prescribed by provider reviewed Reviewed Signs and symptoms of infection Reviewed upcoming appointments including: PET scan on 01/17/24 Discussed patient to follow up with Pulmonology after PET scan to review results Verified patient has oxygen  and using 10L continuously, checking oxygen  saturation multiple times daily Medication review-discussed prednisone  taper-patient voiced understanding   COPD Interventions: Advised patient to engage in light exercise as tolerated 3-5 days a week to aid in the the management of COPD Advised patient to track and manage COPD triggers Advised patient to self assesses COPD action plan zone and make appointment with provider if in the yellow zone for 48 hours without improvement Discussed the importance of adequate rest and management of fatigue with COPD Provided patient with basic written and verbal COPD education on self care/management/and exacerbation prevention Use of home oxygen    Patient Self Care Activities:  Attend all scheduled provider appointments Call provider office for new concerns or questions  Participate in Transition of Care Program/Attend TOC scheduled calls Take medications as prescribed    Plan:  Telephone follow up  appointment with care management team member scheduled for:  01/03/24 at 12:45 pm        Patient verbalizes understanding of instructions and care plan provided today and agrees to view in MyChart. Active MyChart status and patient understanding of how to access instructions and care plan via MyChart confirmed with patient.     Telephone follow up appointment with care management team member scheduled for:01/03/24 at 12:45pm  Please call the care guide team at [phone] if you need to cancel or reschedule your appointment.   Please call 1-800-273-TALK (toll free, 24 hour hotline) go to [facility] Urgent [facility] [address] [place] [phone]) call 911 if you are experiencing a Mental Health or Behavioral Health Crisis or need someone to talk to.  Andrea Dimes RN, BSN [place]  Value-Based Care Institute [facility] Health RN Care Manager [phone]

## 2023-12-27 NOTE — Transitions of Care (Post Inpatient/ED Visit) (Signed)
 Transition of Care week 2  Visit Note  12/27/2023  Name: [name] [name]. MRN: [id number]          DOB: [birth year]  Situation: Patient enrolled in [facility] 30-day program. Visit completed with [name] [name] by telephone.   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   COPD (chronic obstructive pulmonary disease) (HCC)    on 4L home O2   Essential hypertension    Gout    Thrombocytopenia (HCC) 07/28/2012    Assessment: Patient Reported Symptoms: Cognitive Cognitive Status: Able to follow simple commands, Alert and oriented to person, place, and time, Normal speech and language skills      Neurological Neurological Review of Symptoms: No symptoms reported    HEENT HEENT Symptoms Reported: No symptoms reported      Cardiovascular Cardiovascular Symptoms Reported: No symptoms reported    Respiratory Respiratory Symptoms Reported: Dry cough Other Respiratory Symptoms: Patient using oxygen  10L, occasional dry cough Additional Respiratory Details: pulse Ox currently 91%, down to 88% with activity. Patient scheduled for CT on 01/17/24 Respiratory Management Strategies: Routine screening, Medication therapy, CPAP, Adequate rest, Oxygen  therapy, Coping strategies, Breathing exercise Respiratory Self-Management Outcome: 3 (uncertain)  Endocrine Endocrine Symptoms Reported: No symptoms reported    Gastrointestinal Gastrointestinal Symptoms Reported: No symptoms reported      Genitourinary Genitourinary Symptoms Reported: No symptoms reported    Integumentary Integumentary Symptoms Reported: No symptoms reported    Musculoskeletal   Musculoskeletal Management Strategies: Medication therapy, Routine screening, Exercise, Medical device, Adequate rest Musculoskeletal Self-Management Outcome: 4 (good) Musculoskeletal Comment: Ambulates with walker,  patient does exercises provided by PT everyday      Psychosocial Psychosocial Symptoms Reported: Not assessed         There were no vitals filed for this visit.  Medications Reviewed Today     Reviewed by Lucky Andrea LABOR, RN (Registered Nurse) on 12/27/23 at 1348  Med List Status: <None>   Medication Order Taking? Sig Documenting Provider Last Dose Status Informant  albuterol  (PROVENTIL ) (2.5 MG/3ML) 0.083% nebulizer solution [id number]  Take 2.5 mg by nebulization daily as needed for wheezing or shortness of breath. [provider]  Consider Medication Status and Discontinue (Duplicate) Self, Pharmacy Records  albuterol  (PROVENTIL ) (2.5 MG/3ML) 0.083% nebulizer solution [id number] Yes TAKE 3 MLS (2.5 MG TOTAL) BY NEBULIZATION EVERY 6 (SIX) HOURS AS NEEDED FOR SHORTNESS OF BREATH. Newlin, Enobong, MD  Active   albuterol  (VENTOLIN  HFA) 108 (90 Base) MCG/ACT inhaler [id number]  Inhale 2 puffs into the lungs every 6 (six) hours as needed for wheezing or shortness of breath Danton Jon HERO, PA-C  Consider Medication Status and Discontinue (Duplicate) Self, Pharmacy Records  albuterol  (VENTOLIN  HFA) 108 (90 Base) MCG/ACT inhaler [id number] Yes Inhale 2 puffs into the lungs in the morning and at bedtime. [provider]  Active Self, Pharmacy Records    Discontinued 07/03/11 1455 (Entry Error)   allopurinol  (ZYLOPRIM ) 100 MG tablet [id number] Yes Take 1 tablet (100 mg total) by mouth daily. Newlin, Enobong, MD  Active Self, Pharmacy Records  amLODipine  (NORVASC ) 10 MG tablet [id number] Yes Take 1 tablet (10 mg total) by mouth daily. Newlin, Enobong, MD  Active Self, Pharmacy Records  budesonide -formoterol  (SYMBICORT ) 160-4.5 MCG/ACT inhaler [id number]  Inhale 2 puffs into the lungs 2 (two) times daily. Mannam, Praveen, MD  Consider Medication Status and Discontinue (Duplicate) Self,  Pharmacy Records  budesonide -formoterol  (SYMBICORT ) 160-4.5 MCG/ACT inhaler [id number] Yes Inhale 2 puffs  into the lungs 2 (two) times daily. [provider]  Active Self, Pharmacy Records  ezetimibe  (ZETIA ) 10 MG tablet [id number]  Take 1 tablet (10 mg total) by mouth daily. Newlin, Enobong, MD  Consider Medication Status and Discontinue (Duplicate)   ezetimibe  (ZETIA ) 10 MG tablet [id number] Yes Take 10 mg by mouth daily. [provider]  Active Self, Pharmacy Records  guaiFENesin  (MUCINEX ) 600 MG 12 hr tablet [id number] Yes Take 1,200 mg by mouth daily. [provider]  Active   ibuprofen  (ADVIL ) 200 MG tablet [id number]  Take 800 mg by mouth daily as needed for moderate pain (pain score 4-6) or headache. [provider]  Consider Medication Status and Discontinue (Duplicate) Self, Pharmacy Records  ibuprofen  (ADVIL ) 200 MG tablet [id number] Yes Take 800 mg by mouth daily as needed for mild pain (pain score 1-3) or moderate pain (pain score 4-6). [provider]  Active Self, Pharmacy Records  metoprolol  succinate (TOPROL -XL) 50 MG 24 hr tablet [id number] Yes Take 1 tablet (50 mg total) by mouth daily. Newlin, Enobong, MD  Active Self, Pharmacy Records  montelukast  (SINGULAIR ) 10 MG tablet [id number] Yes Take 1 tablet (10 mg total) by mouth at bedtime. Newlin, Enobong, MD  Active Self, Pharmacy Records  OXYGEN  [id number] Yes Inhale 10 L into the lungs continuous. [provider]  Active   predniSONE  (DELTASONE ) 10 MG tablet [id number] Yes Take 4 tablets daily for 3 days followed by 3 tablets daily for 3 days followed by 2 tablets daily for 3 days then 1 tablet daily for 3 days then stop. Sigdel, Santosh, MD  Active   roflumilast  (DALIRESP ) 500 MCG TABS tablet [id number] Yes Take 0.5 tablets (250 mcg total) by mouth daily for 30 days, THEN 1 tablet (500 mcg total) daily. Mannam, Praveen, MD  Active   tiotropium (SPIRIVA  HANDIHALER) 18 MCG inhalation capsule [id number] Yes PLACE 1 CAPSULE (18 MCG TOTAL) INTO INHALER AND INHALE DAILY. Newlin, Enobong, MD  Active    Vitamin D , Ergocalciferol , (DRISDOL ) 1.25 MG (50000 UNIT) CAPS capsule [id number] Yes Take 1 capsule (50,000 Units total) by mouth every 7 (seven) days. Newlin, Enobong, MD  Active   Vitamin D , Ergocalciferol , (DRISDOL ) 1.25 MG (50000 UNIT) CAPS capsule [id number]  Take 50,000 Units by mouth every 7 (seven) days.  Patient not taking: Reported on 12/12/2023   [provider]  Consider Medication Status and Discontinue (Duplicate) Self, Pharmacy Records            Recommendation:   Continue Current Plan of Care  Follow Up Plan:   Telephone follow-up in 1 week  Andrea Dimes RN, BSN [place]  Value-Based Care Institute [facility] Health RN Care Manager [phone]

## 2024-01-01 ENCOUNTER — Telehealth: Payer: Self-pay

## 2024-01-01 DIAGNOSIS — R252 Cramp and spasm: Secondary | ICD-10-CM

## 2024-01-01 NOTE — Telephone Encounter (Signed)
 Copied from CRM [id number]. Topic: Clinical - Medical Advice >> Jan 01, 2024  2:33 PM DeAngela L wrote: Reason for CRM: patient states his hands and feet have been cramping off and on for the last two days and he would like to ask for medical advise cause this has already happened three times this morning  This started today when he was doing his breathing exercises and his hand whichever hand he is using would cramp up and it doesn't last long   Pt num [phone] (M)

## 2024-01-02 ENCOUNTER — Telehealth: Payer: Self-pay | Admitting: Family Medicine

## 2024-01-02 ENCOUNTER — Other Ambulatory Visit: Payer: Self-pay

## 2024-01-02 MED ORDER — TIZANIDINE HCL 4 MG PO TABS
4.0000 mg | ORAL_TABLET | Freq: Three times a day (TID) | ORAL | 0 refills | Status: DC | PRN
  Filled 2024-01-02: qty 60, 20d supply, fill #0

## 2024-01-02 NOTE — Addendum Note (Signed)
 Addended by: Kikuye Korenek on: 01/02/2024 05:34 PM   Modules accepted: Orders

## 2024-01-02 NOTE — Telephone Encounter (Signed)
 I have ordered a blood test to check his potassium level to ensure he is not hypokalemic as this is a possible cause.  I also sent a prescription for muscle relaxant to the pharmacy which he can use for the cramps.

## 2024-01-02 NOTE — Telephone Encounter (Signed)
 Patient's daughter Rondall Radigan came in the office today requesting an update of the letter that was written on 9/17 for her father. She asked that the revised letter include the detail that she became his caregiver on August 06, 2023. Please advise.

## 2024-01-02 NOTE — Telephone Encounter (Signed)
 Routing to PCP for review.

## 2024-01-03 ENCOUNTER — Other Ambulatory Visit: Payer: Self-pay

## 2024-01-03 ENCOUNTER — Other Ambulatory Visit: Payer: Self-pay | Admitting: *Deleted

## 2024-01-03 NOTE — Telephone Encounter (Signed)
 Done

## 2024-01-03 NOTE — Patient Instructions (Signed)
 Visit Information  Thank you for taking time to visit with me today. Please don't hesitate to contact me if I can be of assistance to you before our next scheduled telephone appointment.   Following is a copy of your care plan:   Goals Addressed             This Visit's Progress    VBCI Transitions of Care (TOC) Care Plan       Problems:  Recent Hospitalization for treatment of COPD Knowledge Deficit Related to COPD triggers   Goal:  Over the next 30 days, the patient will not experience hospital readmission  Interventions:  Transitions of Care: Annual Eye Exam education provided regarding need for exam Doctor Visits  - discussed the importance of doctor visits Post discharge activity limitations prescribed by provider reviewed Reviewed Signs and symptoms of infection Reviewed upcoming appointments including: PET scan on 01/17/24 Discussed patient to follow up with Pulmonology after PET scan to review results Verified patient has oxygen  and using 10L continuously, checking oxygen  saturation multiple times daily Medication review Discussed housing, patient planning to move in the next 15 days-declines referral to BSW Explained PCP has ordered lab for potassium level Educated patient on potassium rich foods   COPD Interventions: Advised patient to engage in light exercise as tolerated 3-5 days a week to aid in the the management of COPD Advised patient to track and manage COPD triggers Advised patient to self assesses COPD action plan zone and make appointment with provider if in the yellow zone for 48 hours without improvement Discussed the importance of adequate rest and management of fatigue with COPD Provided patient with basic written and verbal COPD education on self care/management/and exacerbation prevention Use of home oxygen  Provided education on cleaning/changing nasal canula-patient replaces nasal canula every 30 days and CPAP mask at least every 30 days and  cleans in between Discussed patient doing breathing exercises every hour while awake Educated patient on the importance of no smoking in the home-patient aware and family smokes outside   Patient Self Care Activities:  Attend all scheduled provider appointments Call provider office for new concerns or questions  Participate in Transition of Care Program/Attend TOC scheduled calls Take medications as prescribed    Plan:  Telephone follow up appointment with care management team member scheduled for:  01/10/24 at 1 pm        The patient verbalized understanding of instructions, educational materials, and care plan provided today and agreed to receive a mailed copy of patient instructions, educational materials, and care plan.   Telephone follow up appointment with care management team member scheduled for:01/10/24 at 1pm  Please call the care guide team at [phone] if you need to cancel or reschedule your appointment.   Please call 1-800-273-TALK (toll free, 24 hour hotline) go to [facility] Urgent [facility] [address], [place] [phone]) call 911 if you are experiencing a Mental Health or Behavioral Health Crisis or need someone to talk to.  Andrea Dimes RN, BSN [place]  Value-Based Care Institute [facility] Health RN Care Manager [phone]

## 2024-01-03 NOTE — Transitions of Care (Post Inpatient/ED Visit) (Signed)
 Transition of Care week 3  Visit Note  01/03/2024  Name: [name] [name]. MRN: [id number]          DOB: Dec 22, 1960  Situation: Patient enrolled in [facility] 30-day program. Visit completed with Mr. Hartsell by telephone.   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   COPD (chronic obstructive pulmonary disease) (HCC)    on 4L home O2   Essential hypertension    Gout    Thrombocytopenia 07/28/2012    Assessment: Patient Reported Symptoms: Cognitive Cognitive Status: Able to follow simple commands, Normal speech and language skills, Alert and oriented to person, place, and time      Neurological Neurological Review of Symptoms: No symptoms reported    HEENT HEENT Symptoms Reported: No symptoms reported      Cardiovascular Cardiovascular Symptoms Reported: No symptoms reported    Respiratory Other Respiratory Symptoms: Oxygen  10L Additional Respiratory Details: Pulse Ox currently 92%, patient checking every hour. Performs breathing exercises every hour while awake Respiratory Management Strategies: Routine screening, CPAP, Breathing exercise, Exercise, Coping strategies, Adequate rest, Oxygen  therapy Respiratory Self-Management Outcome: 4 (good)  Endocrine Endocrine Symptoms Reported: Not assessed    Gastrointestinal Gastrointestinal Symptoms Reported: No symptoms reported      Genitourinary Genitourinary Symptoms Reported: No symptoms reported    Integumentary Integumentary Symptoms Reported: No symptoms reported    Musculoskeletal Musculoskelatal Symptoms Reviewed: Weakness, Other Other Musculoskeletal Symptoms: cramping in hands Musculoskeletal Management Strategies: Medication therapy, Routine screening, Adequate rest, Coping strategies, Exercise Musculoskeletal Self-Management Outcome: 3 (uncertain) Musculoskeletal Comment:  Ambulates with walker, patient continues to do exercises provided by PT. Explained that PCP has ordered for potassium level to be drawn and new prescription for muscle relaxer has been sent to the pharmacy. Advised patient to have blood work drawn.      Psychosocial Psychosocial Symptoms Reported: No symptoms reported         Vitals:   01/03/24 1251  SpO2: 92%    Medications Reviewed Today     Reviewed by Lucky Andrea LABOR, RN (Registered Nurse) on 01/03/24 at 1300  Med List Status: <None>   Medication Order Taking? Sig Documenting Provider Last Dose Status Informant  albuterol  (PROVENTIL ) (2.5 MG/3ML) 0.083% nebulizer solution [id number] Yes Take 2.5 mg by nebulization daily as needed for wheezing or shortness of breath. [provider]  Active Self, Pharmacy Records  albuterol  (PROVENTIL ) (2.5 MG/3ML) 0.083% nebulizer solution [id number] Yes TAKE 3 MLS (2.5 MG TOTAL) BY NEBULIZATION EVERY 6 (SIX) HOURS AS NEEDED FOR SHORTNESS OF BREATH. Newlin, Enobong, MD  Active   albuterol  (VENTOLIN  HFA) 108 (90 Base) MCG/ACT inhaler [id number] Yes Inhale 2 puffs into the lungs every 6 (six) hours as needed for wheezing or shortness of breath Danton Jon HERO, PA-C  Active Self, Pharmacy Records  albuterol  (VENTOLIN  HFA) 108 (90 Base) MCG/ACT inhaler [id number] Yes Inhale 2 puffs into the lungs in the morning and at bedtime. [provider]  Active Self, Pharmacy Records    Discontinued 07/03/11 1455 (Entry Error)   allopurinol  (ZYLOPRIM ) 100 MG tablet [id number] Yes Take 1 tablet (100 mg total) by mouth daily. Newlin, Enobong, MD  Active Self, Pharmacy Records  amLODipine  (NORVASC ) 10 MG tablet [id number] Yes Take 1 tablet (10 mg total) by mouth daily. Newlin, Enobong, MD  Active Self, Pharmacy Records  budesonide -formoterol  (SYMBICORT ) 160-4.5 MCG/ACT inhaler [id number] Yes Inhale  2 puffs into the lungs 2 (two) times daily. Mannam, Praveen, MD  Active Self, Pharmacy Records   budesonide -formoterol  (SYMBICORT ) 160-4.5 MCG/ACT inhaler [id number] Yes Inhale 2 puffs into the lungs 2 (two) times daily. [provider]  Active Self, Pharmacy Records  ezetimibe  (ZETIA ) 10 MG tablet [id number] Yes Take 1 tablet (10 mg total) by mouth daily. Newlin, Enobong, MD  Active   ezetimibe  (ZETIA ) 10 MG tablet [id number] Yes Take 10 mg by mouth daily. [provider]  Active Self, Pharmacy Records  guaiFENesin  (MUCINEX ) 600 MG 12 hr tablet [id number] Yes Take 1,200 mg by mouth daily. [provider]  Active   ibuprofen  (ADVIL ) 200 MG tablet [id number] Yes Take 800 mg by mouth daily as needed for moderate pain (pain score 4-6) or headache. [provider]  Active Self, Pharmacy Records  ibuprofen  (ADVIL ) 200 MG tablet [id number] Yes Take 800 mg by mouth daily as needed for mild pain (pain score 1-3) or moderate pain (pain score 4-6). [provider]  Active Self, Pharmacy Records  metoprolol  succinate (TOPROL -XL) 50 MG 24 hr tablet [id number] Yes Take 1 tablet (50 mg total) by mouth daily. Newlin, Enobong, MD  Active Self, Pharmacy Records  montelukast  (SINGULAIR ) 10 MG tablet [id number] Yes Take 1 tablet (10 mg total) by mouth at bedtime. Newlin, Enobong, MD  Active Self, Pharmacy Records  OXYGEN  [id number] Yes Inhale 10 L into the lungs continuous. [provider]  Active   predniSONE  (DELTASONE ) 10 MG tablet [id number]  Take 4 tablets daily for 3 days followed by 3 tablets daily for 3 days followed by 2 tablets daily for 3 days then 1 tablet daily for 3 days then stop.  Patient not taking: Reported on 01/03/2024   Sigdel, Santosh, MD  Active   roflumilast  (DALIRESP ) 500 MCG TABS tablet [id number] Yes Take 0.5 tablets (250 mcg total) by mouth daily for 30 days, THEN 1 tablet (500 mcg total) daily. Mannam, Praveen, MD  Active   tiotropium (SPIRIVA  HANDIHALER) 18 MCG inhalation capsule [id number] Yes PLACE 1 CAPSULE (18 MCG TOTAL) INTO INHALER AND  INHALE DAILY. Newlin, Enobong, MD  Active   tiZANidine  (ZANAFLEX ) 4 MG tablet [id number]  Take 1 tablet (4 mg total) by mouth every 8 (eight) hours as needed.  Patient not taking: Reported on 01/03/2024   Newlin, Enobong, MD  Active   Vitamin D , Ergocalciferol , (DRISDOL ) 1.25 MG (50000 UNIT) CAPS capsule [id number] Yes Take 1 capsule (50,000 Units total) by mouth every 7 (seven) days. Newlin, Enobong, MD  Active   Vitamin D , Ergocalciferol , (DRISDOL ) 1.25 MG (50000 UNIT) CAPS capsule [id number] Yes Take 50,000 Units by mouth every 7 (seven) days. [provider]  Active Self, Pharmacy Records            Recommendation:   Continue Current Plan of Care  Follow Up Plan:   Telephone follow-up in 1 week  Andrea Dimes RN, BSN [place]  Value-Based Care Institute [facility] Health RN Care Manager [phone]

## 2024-01-04 ENCOUNTER — Other Ambulatory Visit: Payer: Self-pay

## 2024-01-04 NOTE — Telephone Encounter (Signed)
Pt has been informed that letter is ready for pick up. 

## 2024-01-07 NOTE — Telephone Encounter (Signed)
 Patient is aware of medication that has been call in and also K+ lab draw.

## 2024-01-10 ENCOUNTER — Other Ambulatory Visit: Payer: Self-pay | Admitting: *Deleted

## 2024-01-10 NOTE — Patient Instructions (Signed)
 Visit Information  Thank you for taking time to visit with me today. Please don't hesitate to contact me if I can be of assistance to you before our next scheduled telephone appointment.   Following is a copy of your care plan:   Goals Addressed             This Visit's Progress    VBCI Transitions of Care (TOC) Care Plan       Problems:  Recent Hospitalization for treatment of COPD Knowledge Deficit Related to COPD triggers   Goal:  Over the next 30 days, the patient will not experience hospital readmission  Interventions:  Transitions of Care: Annual Eye Exam education provided regarding need for exam Doctor Visits  - discussed the importance of doctor visits Post discharge activity limitations prescribed by provider reviewed Reviewed Signs and symptoms of infection Verified patient has oxygen  and using 10L continuously, checking oxygen  saturation multiple times daily Explained PCP has ordered lab for potassium level Discussed rescheduling PET scan-patient unsure if he wants to have this scan, will consider     COPD Interventions: Advised patient to engage in light exercise as tolerated 3-5 days a week to aid in the the management of COPD Advised patient to track and manage COPD triggers Advised patient to self assesses COPD action plan zone and make appointment with provider if in the yellow zone for 48 hours without improvement Discussed the importance of adequate rest and management of fatigue with COPD Provided education about and advised patient to utilize infection prevention strategies to reduce risk of respiratory infection Provided patient with basic written and verbal COPD education on self care/management/and exacerbation prevention Use of home oxygen  Discussed patient doing breathing exercises every hour while awake   Patient Self Care Activities:  Attend all scheduled provider appointments Call provider office for new concerns or questions  Participate  in Transition of Care Program/Attend TOC scheduled calls Take medications as prescribed    Plan:  Telephone follow up appointment with care management team member scheduled for:  01/17/24 at 1 pm        Patient verbalizes understanding of instructions and care plan provided today and agrees to view in MyChart. Active MyChart status and patient understanding of how to access instructions and care plan via MyChart confirmed with patient.     Telephone follow up appointment with care management team member scheduled for:01/17/24 at 1pm  Please call the care guide team at [phone] if you need to cancel or reschedule your appointment.   Please call 1-800-273-TALK (toll free, 24 hour hotline) go to [facility] Urgent [facility] [address] [place] [phone]) call 911 if you are experiencing a Mental Health or Behavioral Health Crisis or need someone to talk to.  Andrea Dimes RN, BSN [place]  Value-Based Care Institute [facility] Health RN Care Manager [phone]

## 2024-01-10 NOTE — Transitions of Care (Post Inpatient/ED Visit) (Signed)
 Transition of Care week 4  Visit Note  01/10/2024  Name: [name] [name]. MRN: [id number]          DOB: May 22, 1960  Situation: Patient enrolled in [facility] 30-day program. Visit completed with Mr. Zarrella by telephone.   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   COPD (chronic obstructive pulmonary disease) (HCC)    on 4L home O2   Essential hypertension    Gout    Thrombocytopenia 07/28/2012    Assessment: Patient Reported Symptoms: Cognitive Cognitive Status: Able to follow simple commands, Alert and oriented to person, place, and time, Normal speech and language skills      Neurological Neurological Review of Symptoms: No symptoms reported    HEENT HEENT Symptoms Reported: Not assessed      Cardiovascular Cardiovascular Symptoms Reported: No symptoms reported    Respiratory Respiratory Symptoms Reported: No symptoms reported Other Respiratory Symptoms: Oxygen  10L Respiratory Management Strategies: Routine screening, Breathing exercise, CPAP, Exercise, Coping strategies, Adequate rest, Oxygen  therapy Respiratory Self-Management Outcome: 4 (good)  Endocrine Endocrine Symptoms Reported: Not assessed    Gastrointestinal Gastrointestinal Symptoms Reported: Not assessed      Genitourinary Genitourinary Symptoms Reported: Not assessed    Integumentary Integumentary Symptoms Reported: Not assessed    Musculoskeletal Musculoskelatal Symptoms Reviewed: Not assessed        Psychosocial Psychosocial Symptoms Reported: Not assessed         There were no vitals filed for this visit.  Medications Reviewed Today     Reviewed by Lucky Andrea LABOR, RN (Registered Nurse) on 01/10/24 at 1314  Med List Status: <None>   Medication Order Taking? Sig Documenting Provider Last Dose Status Informant  albuterol  (PROVENTIL ) (2.5 MG/3ML)  0.083% nebulizer solution [id number]  Take 2.5 mg by nebulization daily as needed for wheezing or shortness of breath. [provider]  Active Self, Pharmacy Records  albuterol  (PROVENTIL ) (2.5 MG/3ML) 0.083% nebulizer solution [id number]  TAKE 3 MLS (2.5 MG TOTAL) BY NEBULIZATION EVERY 6 (SIX) HOURS AS NEEDED FOR SHORTNESS OF BREATH. Newlin, Enobong, MD  Active   albuterol  (VENTOLIN  HFA) 108 (90 Base) MCG/ACT inhaler [id number]  Inhale 2 puffs into the lungs every 6 (six) hours as needed for wheezing or shortness of breath Danton Jon HERO, PA-C  Active Self, Pharmacy Records  albuterol  (VENTOLIN  HFA) 108 [phone] Base) MCG/ACT inhaler [id number]  Inhale 2 puffs into the lungs in the morning and at bedtime. [provider]  Active Self, Pharmacy Records    Discontinued 07/03/11 1455 (Entry Error)   allopurinol  (ZYLOPRIM ) 100 MG tablet [id number]  Take 1 tablet (100 mg total) by mouth daily. Newlin, Enobong, MD  Active Self, Pharmacy Records  amLODipine  (NORVASC ) 10 MG tablet [id number]  Take 1 tablet (10 mg total) by mouth daily. Newlin, Enobong, MD  Active Self, Pharmacy Records  budesonide -formoterol  (SYMBICORT ) 160-4.5 MCG/ACT inhaler [id number]  Inhale 2 puffs into the lungs 2 (two) times daily. Mannam, Praveen, MD  Active Self, Pharmacy Records  budesonide -formoterol  (SYMBICORT ) 160-4.5 MCG/ACT inhaler [id number]  Inhale 2 puffs into the lungs 2 (two) times daily. [provider]  Active Self, Pharmacy Records  ezetimibe  (ZETIA ) 10 MG tablet [id number]  Take 1 tablet (10 mg total) by mouth daily. Newlin, Enobong, MD  Active   ezetimibe  (ZETIA ) 10 MG tablet [id number]  Take 10 mg by mouth daily. [provider]  Active Self, Pharmacy Records  guaiFENesin  (MUCINEX ) 600 MG 12 hr tablet [id number]  Take 1,200 mg by mouth daily. [provider]  Active   ibuprofen  (ADVIL ) 200 MG tablet [id number]  Take 800 mg by mouth daily as needed for moderate pain (pain score 4-6) or  headache. [provider]  Active Self, Pharmacy Records  ibuprofen  (ADVIL ) 200 MG tablet [id number]  Take 800 mg by mouth daily as needed for mild pain (pain score 1-3) or moderate pain (pain score 4-6). [provider]  Active Self, Pharmacy Records  metoprolol  succinate (TOPROL -XL) 50 MG 24 hr tablet [id number]  Take 1 tablet (50 mg total) by mouth daily. Newlin, Enobong, MD  Active Self, Pharmacy Records  montelukast  (SINGULAIR ) 10 MG tablet [id number]  Take 1 tablet (10 mg total) by mouth at bedtime. Newlin, Enobong, MD  Active Self, Pharmacy Records  OXYGEN  [id number] Yes Inhale 10 L into the lungs continuous. [provider]  Active   predniSONE  (DELTASONE ) 10 MG tablet [id number]  Take 4 tablets daily for 3 days followed by 3 tablets daily for 3 days followed by 2 tablets daily for 3 days then 1 tablet daily for 3 days then stop.  Patient not taking: Reported on 01/03/2024   Sigdel, Santosh, MD  Active   roflumilast  (DALIRESP ) 500 MCG TABS tablet [id number]  Take 0.5 tablets (250 mcg total) by mouth daily for 30 days, THEN 1 tablet (500 mcg total) daily. Mannam, Praveen, MD  Active   tiotropium (SPIRIVA  HANDIHALER) 18 MCG inhalation capsule [id number]  PLACE 1 CAPSULE (18 MCG TOTAL) INTO INHALER AND INHALE DAILY. Newlin, Enobong, MD  Active   tiZANidine  (ZANAFLEX ) 4 MG tablet [id number]  Take 1 tablet (4 mg total) by mouth every 8 (eight) hours as needed.  Patient not taking: Reported on 01/03/2024   Newlin, Enobong, MD  Active   Vitamin D , Ergocalciferol , (DRISDOL ) 1.25 MG (50000 UNIT) CAPS capsule [id number]  Take 1 capsule (50,000 Units total) by mouth every 7 (seven) days. Newlin, Enobong, MD  Active   Vitamin D , Ergocalciferol , (DRISDOL ) 1.25 MG (50000 UNIT) CAPS capsule [id number]  Take 50,000 Units by mouth every 7 (seven) days. [provider]  Active Self, Pharmacy Records           Mr Harcum was on his way to the provider office to have lab work done.  Patient was unable to complete a full medication review. Patient reports having everything and denies any changes to his medication regimen.  Recommendation:   Continue Current Plan of Care  Follow Up Plan:   Telephone follow-up in 1 week  Andrea Dimes RN, BSN [place]  [facility] RN Care Manager [phone]

## 2024-01-14 ENCOUNTER — Other Ambulatory Visit: Payer: Self-pay | Admitting: Family Medicine

## 2024-01-14 ENCOUNTER — Other Ambulatory Visit: Payer: Self-pay | Admitting: Pulmonary Disease

## 2024-01-14 ENCOUNTER — Other Ambulatory Visit: Payer: Self-pay

## 2024-01-14 DIAGNOSIS — J449 Chronic obstructive pulmonary disease, unspecified: Secondary | ICD-10-CM

## 2024-01-14 MED ORDER — ALBUTEROL SULFATE (2.5 MG/3ML) 0.083% IN NEBU
2.5000 mg | INHALATION_SOLUTION | Freq: Four times a day (QID) | RESPIRATORY_TRACT | 0 refills | Status: DC | PRN
  Filled 2024-01-14: qty 360, 30d supply, fill #0

## 2024-01-14 MED FILL — Roflumilast Tab 500 MCG: ORAL | 15 days supply | Qty: 15 | Fill #1 | Status: CN

## 2024-01-17 ENCOUNTER — Other Ambulatory Visit: Payer: Self-pay

## 2024-01-17 ENCOUNTER — Other Ambulatory Visit: Payer: Self-pay | Admitting: *Deleted

## 2024-01-17 ENCOUNTER — Other Ambulatory Visit (HOSPITAL_COMMUNITY)

## 2024-01-17 DIAGNOSIS — J441 Chronic obstructive pulmonary disease with (acute) exacerbation: Secondary | ICD-10-CM

## 2024-01-17 NOTE — Transitions of Care (Post Inpatient/ED Visit) (Signed)
 Transition of Care week 5  Visit Note  01/17/2024  Name: [name] [name]. MRN: [id number]          DOB: [birth year]  Situation: Patient enrolled in [facility] 30-day program. Visit completed with [name] [name] by telephone.   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   COPD (chronic obstructive pulmonary disease) (HCC)    on 4L home O2   Essential hypertension    Gout    Thrombocytopenia 07/28/2012    Assessment: Patient Reported Symptoms: Cognitive Cognitive Status: Able to follow simple commands, Alert and oriented to person, place, and time, Normal speech and language skills      Neurological Neurological Review of Symptoms: No symptoms reported    HEENT HEENT Symptoms Reported: Not assessed      Cardiovascular Cardiovascular Symptoms Reported: No symptoms reported Does patient have uncontrolled Hypertension?: No    Respiratory Other Respiratory Symptoms: Oxygen  10L continuous Additional Respiratory Details: Pulse Ox currently 92%, performs breathing exercises every hour while awake. RNCM discussed infection prevention with patient and family. Respiratory Management Strategies: Routine screening, CPAP, Breathing exercise, Adequate rest, Oxygen  therapy Respiratory Self-Management Outcome: 4 (good)  Endocrine Endocrine Symptoms Reported: Not assessed    Gastrointestinal Gastrointestinal Symptoms Reported: No symptoms reported      Genitourinary Genitourinary Symptoms Reported: No symptoms reported    Integumentary Integumentary Symptoms Reported: No symptoms reported    Musculoskeletal Musculoskelatal Symptoms Reviewed: No symptoms reported        Psychosocial Psychosocial Symptoms Reported: Not assessed         There were no vitals filed for this visit.  Medications Reviewed Today     Reviewed by Lucky Andrea LABOR, RN  (Registered Nurse) on 01/17/24 at 1318  Med List Status: <None>   Medication Order Taking? Sig Documenting Provider Last Dose Status Informant  albuterol  (PROVENTIL ) (2.5 MG/3ML) 0.083% nebulizer solution [id number] Yes Take 2.5 mg by nebulization daily as needed for wheezing or shortness of breath. [provider]  Active Self, Pharmacy Records  albuterol  (PROVENTIL ) (2.5 MG/3ML) 0.083% nebulizer solution [id number] Yes TAKE 3 MLS (2.5 MG TOTAL) BY NEBULIZATION EVERY 6 (SIX) HOURS AS NEEDED FOR SHORTNESS OF BREATH. Newlin, Enobong, MD  Active   albuterol  (VENTOLIN  HFA) 108 (90 Base) MCG/ACT inhaler [id number] Yes Inhale 2 puffs into the lungs every 6 (six) hours as needed for wheezing or shortness of breath Danton Jon HERO, PA-C  Active Self, Pharmacy Records  albuterol  (VENTOLIN  HFA) 108 [phone] Base) MCG/ACT inhaler [id number] Yes Inhale 2 puffs into the lungs in the morning and at bedtime. [provider]  Active Self, Pharmacy Records    Discontinued 07/03/11 1455 (Entry Error)   allopurinol  (ZYLOPRIM ) 100 MG tablet [id number] Yes Take 1 tablet (100 mg total) by mouth daily. Newlin, Enobong, MD  Active Self, Pharmacy Records  amLODipine  (NORVASC ) 10 MG tablet [id number] Yes Take 1 tablet (10 mg total) by mouth daily. Newlin, Enobong, MD  Active Self, Pharmacy Records  budesonide -formoterol  (SYMBICORT ) 160-4.5 MCG/ACT inhaler [id number] Yes Inhale 2 puffs into the lungs 2 (two) times daily. Mannam, Praveen, MD  Active Self, Pharmacy Records  budesonide -formoterol  (SYMBICORT ) 160-4.5 MCG/ACT inhaler [id number] Yes Inhale 2 puffs into the lungs 2 (two) times daily. [provider]  Active Self, Pharmacy Records  cyanocobalamin  (VITAMIN B12) 1000 MCG tablet [id number] Yes Take 1,000 mcg by mouth daily. [provider]  Active   ezetimibe  (ZETIA ) 10 MG tablet [id number] Yes Take 1 tablet (10 mg total) by mouth daily. Newlin, Enobong, MD  Active   ezetimibe  (ZETIA ) 10 MG tablet  [id number] Yes Take 10 mg by mouth daily. [provider]  Active Self, Pharmacy Records  guaiFENesin  (MUCINEX ) 600 MG 12 hr tablet [id number] Yes Take 1,200 mg by mouth daily. [provider]  Active   ibuprofen  (ADVIL ) 200 MG tablet [id number] Yes Take 800 mg by mouth daily as needed for moderate pain (pain score 4-6) or headache. [provider]  Active Self, Pharmacy Records  ibuprofen  (ADVIL ) 200 MG tablet [id number] Yes Take 800 mg by mouth daily as needed for mild pain (pain score 1-3) or moderate pain (pain score 4-6). [provider]  Active Self, Pharmacy Records  metoprolol  succinate (TOPROL -XL) 50 MG 24 hr tablet [id number] Yes Take 1 tablet (50 mg total) by mouth daily. Newlin, Enobong, MD  Active Self, Pharmacy Records  montelukast  (SINGULAIR ) 10 MG tablet [id number] Yes Take 1 tablet (10 mg total) by mouth at bedtime. Newlin, Enobong, MD  Active Self, Pharmacy Records  OXYGEN  [id number] Yes Inhale 10 L into the lungs continuous. [provider]  Active   predniSONE  (DELTASONE ) 10 MG tablet [id number]  Take 4 tablets daily for 3 days followed by 3 tablets daily for 3 days followed by 2 tablets daily for 3 days then 1 tablet daily for 3 days then stop.  Patient not taking: Reported on 01/17/2024   Sigdel, Santosh, MD  Active   roflumilast  (DALIRESP ) 500 MCG TABS tablet [id number] Yes Take 0.5 tablets (250 mcg total) by mouth daily for 30 days, THEN 1 tablet (500 mcg total) daily. Mannam, Praveen, MD  Active   tiotropium (SPIRIVA  HANDIHALER) 18 MCG inhalation capsule [id number] Yes PLACE 1 CAPSULE (18 MCG TOTAL) INTO INHALER AND INHALE DAILY. Newlin, Enobong, MD  Active   tiZANidine  (ZANAFLEX ) 4 MG tablet [id number] Yes Take 1 tablet (4 mg total) by mouth every 8 (eight) hours as needed. Newlin, Enobong, MD  Active   Vitamin D , Ergocalciferol , (DRISDOL ) 1.25 MG (50000 UNIT) CAPS capsule [id number] Yes Take 1 capsule (50,000 Units total) by mouth every 7  (seven) days. Newlin, Enobong, MD  Active   Vitamin D , Ergocalciferol , (DRISDOL ) 1.25 MG (50000 UNIT) CAPS capsule [id number] Yes Take 50,000 Units by mouth every 7 (seven) days. [provider]  Active Self, Pharmacy Records            Recommendation:   Continue Current Plan of Care  Follow Up Plan:   Referral to RN Case Manager Closing From:  Transitions of Care Program  Andrea Dimes RN, BSN [place]  [facility] RN Care Manager [phone]

## 2024-01-17 NOTE — Patient Instructions (Signed)
 Visit Information  Thank you for taking time to visit with me today. Please don't hesitate to contact me if I can be of assistance to you before our next scheduled telephone appointment.   Following is a copy of your care plan:   Goals Addressed             This Visit's Progress    COMPLETED: [facility] Transitions of Care (TOC) Care Plan       Problems:  Recent Hospitalization for treatment of COPD Knowledge Deficit Related to COPD triggers   Goal: MET Over the next 30 days, the patient will not experience hospital readmission  Interventions:  Transitions of Care: Doctor Visits  - discussed the importance of doctor visits Referral to Longitudinal Nurse Case Manager for Ongoing follow-up Post discharge activity limitations prescribed by provider reviewed Reviewed Signs and symptoms of infection Verified patient has oxygen  and using 10L continuously, checking oxygen  saturation multiple times daily Discussed rescheduling PET scan-scheduled on 02/01/24  COPD Interventions: Advised patient to engage in light exercise as tolerated 3-5 days a week to aid in the the management of COPD Advised patient to track and manage COPD triggers Advised patient to self assesses COPD action plan zone and make appointment with provider if in the yellow zone for 48 hours without improvement Discussed the importance of adequate rest and management of fatigue with COPD Provided education about and advised patient to utilize infection prevention strategies to reduce risk of respiratory infection Provided patient with basic written and verbal COPD education on self care/management/and exacerbation prevention Use of home oxygen  Discussed patient doing breathing exercises every hour while awake   Patient Self Care Activities:  Attend all scheduled provider appointments Call provider office for new concerns or questions  Participate in Transition of Care Program/Attend TOC scheduled calls Take medications  as prescribed    Plan:  The care management team will reach out to the patient again over the next 30 days.        Patient verbalizes understanding of instructions and care plan provided today and agrees to view in MyChart. Active MyChart status and patient understanding of how to access instructions and care plan via MyChart confirmed with patient.     The care management team will reach out to the patient again over the next 30 days.   Please call the care guide team at [phone] if you need to cancel or reschedule your appointment.   Please call [phone]-TALK (toll free, 24 hour hotline) go to [facility] Urgent [facility] [address] [place] [phone]) call 911 if you are experiencing a Mental Health or Behavioral Health Crisis or need someone to talk to.  Andrea Dimes RN, BSN [place]  [facility] RN Care Manager [phone]

## 2024-01-18 ENCOUNTER — Telehealth: Payer: Self-pay | Admitting: Pulmonary Disease

## 2024-01-18 ENCOUNTER — Other Ambulatory Visit (HOSPITAL_COMMUNITY): Payer: Self-pay

## 2024-01-18 ENCOUNTER — Other Ambulatory Visit: Payer: Self-pay

## 2024-01-18 ENCOUNTER — Other Ambulatory Visit: Payer: Self-pay | Admitting: Physician Assistant

## 2024-01-18 MED ORDER — ALBUTEROL SULFATE HFA 108 (90 BASE) MCG/ACT IN AERS
2.0000 | INHALATION_SPRAY | Freq: Four times a day (QID) | RESPIRATORY_TRACT | 2 refills | Status: AC | PRN
  Filled 2024-01-18: qty 18, 25d supply, fill #0
  Filled 2024-04-09: qty 18, 25d supply, fill #1
  Filled 2024-04-24: qty 6.7, 25d supply, fill #2
  Filled 2024-04-28 (×2): qty 18, 25d supply, fill #2

## 2024-01-19 ENCOUNTER — Emergency Department (HOSPITAL_COMMUNITY)

## 2024-01-19 ENCOUNTER — Inpatient Hospital Stay (HOSPITAL_COMMUNITY)
Admission: EM | Admit: 2024-01-19 | Discharge: 2024-01-23 | DRG: 193 | Disposition: A | Discharge status: Home / Self Care | Attending: Infectious Diseases | Admitting: Infectious Diseases

## 2024-01-19 ENCOUNTER — Encounter (HOSPITAL_COMMUNITY): Payer: Self-pay | Admitting: Emergency Medicine

## 2024-01-19 ENCOUNTER — Other Ambulatory Visit: Payer: Self-pay

## 2024-01-19 DIAGNOSIS — Z66 Do not resuscitate: Secondary | ICD-10-CM | POA: Diagnosis present

## 2024-01-19 DIAGNOSIS — R000 Tachycardia, unspecified: Secondary | ICD-10-CM | POA: Diagnosis present

## 2024-01-19 DIAGNOSIS — J9621 Acute and chronic respiratory failure with hypoxia: Secondary | ICD-10-CM | POA: Diagnosis not present

## 2024-01-19 DIAGNOSIS — M109 Gout, unspecified: Secondary | ICD-10-CM | POA: Diagnosis present

## 2024-01-19 DIAGNOSIS — Z87891 Personal history of nicotine dependence: Secondary | ICD-10-CM | POA: Diagnosis not present

## 2024-01-19 DIAGNOSIS — Z8249 Family history of ischemic heart disease and other diseases of the circulatory system: Secondary | ICD-10-CM

## 2024-01-19 DIAGNOSIS — I10 Essential (primary) hypertension: Secondary | ICD-10-CM

## 2024-01-19 DIAGNOSIS — I129 Hypertensive chronic kidney disease with stage 1 through stage 4 chronic kidney disease, or unspecified chronic kidney disease: Secondary | ICD-10-CM | POA: Diagnosis present

## 2024-01-19 DIAGNOSIS — J441 Chronic obstructive pulmonary disease with (acute) exacerbation: Principal | ICD-10-CM | POA: Diagnosis present

## 2024-01-19 DIAGNOSIS — R911 Solitary pulmonary nodule: Secondary | ICD-10-CM | POA: Diagnosis present

## 2024-01-19 DIAGNOSIS — Z888 Allergy status to other drugs, medicaments and biological substances status: Secondary | ICD-10-CM

## 2024-01-19 DIAGNOSIS — D649 Anemia, unspecified: Secondary | ICD-10-CM

## 2024-01-19 DIAGNOSIS — Z87892 Personal history of anaphylaxis: Secondary | ICD-10-CM

## 2024-01-19 DIAGNOSIS — Z91014 Allergy to mammalian meats: Secondary | ICD-10-CM | POA: Diagnosis not present

## 2024-01-19 DIAGNOSIS — D509 Iron deficiency anemia, unspecified: Secondary | ICD-10-CM | POA: Diagnosis present

## 2024-01-19 DIAGNOSIS — Z79899 Other long term (current) drug therapy: Secondary | ICD-10-CM

## 2024-01-19 DIAGNOSIS — J440 Chronic obstructive pulmonary disease with acute lower respiratory infection: Secondary | ICD-10-CM | POA: Diagnosis present

## 2024-01-19 DIAGNOSIS — Z5982 Transportation insecurity: Secondary | ICD-10-CM

## 2024-01-19 DIAGNOSIS — E785 Hyperlipidemia, unspecified: Secondary | ICD-10-CM | POA: Diagnosis not present

## 2024-01-19 DIAGNOSIS — J189 Pneumonia, unspecified organism: Secondary | ICD-10-CM | POA: Diagnosis not present

## 2024-01-19 DIAGNOSIS — Z800 Family history of malignant neoplasm of digestive organs: Secondary | ICD-10-CM | POA: Diagnosis not present

## 2024-01-19 DIAGNOSIS — Z9981 Dependence on supplemental oxygen: Secondary | ICD-10-CM | POA: Diagnosis not present

## 2024-01-19 DIAGNOSIS — Z515 Encounter for palliative care: Secondary | ICD-10-CM | POA: Diagnosis not present

## 2024-01-19 DIAGNOSIS — Z7951 Long term (current) use of inhaled steroids: Secondary | ICD-10-CM

## 2024-01-19 DIAGNOSIS — Z7952 Long term (current) use of systemic steroids: Secondary | ICD-10-CM | POA: Diagnosis not present

## 2024-01-19 DIAGNOSIS — Z91013 Allergy to seafood: Secondary | ICD-10-CM

## 2024-01-19 DIAGNOSIS — R0602 Shortness of breath: Secondary | ICD-10-CM

## 2024-01-19 DIAGNOSIS — R0902 Hypoxemia: Secondary | ICD-10-CM

## 2024-01-19 DIAGNOSIS — R069 Unspecified abnormalities of breathing: Secondary | ICD-10-CM | POA: Diagnosis not present

## 2024-01-19 DIAGNOSIS — Z8701 Personal history of pneumonia (recurrent): Secondary | ICD-10-CM

## 2024-01-19 DIAGNOSIS — J439 Emphysema, unspecified: Secondary | ICD-10-CM | POA: Diagnosis not present

## 2024-01-19 DIAGNOSIS — Z1152 Encounter for screening for COVID-19: Secondary | ICD-10-CM

## 2024-01-19 DIAGNOSIS — N182 Chronic kidney disease, stage 2 (mild): Secondary | ICD-10-CM | POA: Diagnosis not present

## 2024-01-19 DIAGNOSIS — D75839 Thrombocytosis, unspecified: Secondary | ICD-10-CM | POA: Diagnosis present

## 2024-01-19 DIAGNOSIS — J90 Pleural effusion, not elsewhere classified: Secondary | ICD-10-CM | POA: Diagnosis not present

## 2024-01-19 DIAGNOSIS — R918 Other nonspecific abnormal finding of lung field: Secondary | ICD-10-CM | POA: Diagnosis not present

## 2024-01-19 DIAGNOSIS — R0603 Acute respiratory distress: Secondary | ICD-10-CM | POA: Diagnosis not present

## 2024-01-19 LAB — RESPIRATORY PANEL BY PCR

## 2024-01-19 LAB — BASIC METABOLIC PANEL WITH GFR
Anion gap: 11 (ref 5–15)
BUN: 9 mg/dL (ref 8–23)
CO2: 29 mmol/L (ref 22–32)
Calcium: 9.7 mg/dL (ref 8.9–10.3)
Chloride: 98 mmol/L (ref 98–111)
Creatinine, Ser: 1.04 mg/dL (ref 0.61–1.24)
GFR, Estimated: >60 mL/min (ref >60)
Glucose, Bld: 116 mg/dL — ABNORMAL HIGH (ref 70–99)
Potassium: 4.1 mmol/L (ref 3.5–5.1)
Sodium: 138 mmol/L (ref 135–145)

## 2024-01-19 LAB — CBC
HCT: 40 % (ref 39.0–52.0)
Hemoglobin: 12.1 g/dL — ABNORMAL LOW (ref 13.0–17.0)
MCH: 22.7 pg — ABNORMAL LOW (ref 26.0–34.0)
MCHC: 30.3 g/dL (ref 30.0–36.0)
MCV: 74.9 fL — ABNORMAL LOW (ref 80.0–100.0)
Platelets: 556 K/uL — ABNORMAL HIGH (ref 150–400)
RBC: 5.34 MIL/uL (ref 4.22–5.81)
RDW: 15.3 % (ref 11.5–15.5)
WBC: 13.6 K/uL — ABNORMAL HIGH (ref 4.0–10.5)
nRBC: 0 % (ref 0.0–0.2)

## 2024-01-19 LAB — BRAIN NATRIURETIC PEPTIDE: B Natriuretic Peptide: 46.2 pg/mL (ref 0.0–100.0)

## 2024-01-19 LAB — TROPONIN I (HIGH SENSITIVITY)
Troponin I (High Sensitivity): 6 ng/L (ref <18)
Troponin I (High Sensitivity): 8 ng/L (ref <18)

## 2024-01-19 LAB — RESP PANEL BY RT-PCR (RSV, FLU A&B, COVID)  RVPGX2
Influenza A by PCR: NEGATIVE
Influenza B by PCR: NEGATIVE
Resp Syncytial Virus by PCR: NEGATIVE
SARS Coronavirus 2 by RT PCR: NEGATIVE

## 2024-01-19 MED ORDER — TIZANIDINE HCL 4 MG PO TABS
4.0000 mg | ORAL_TABLET | Freq: Three times a day (TID) | ORAL | Status: DC | PRN
Start: 2024-01-19 — End: 2024-01-23

## 2024-01-19 MED ORDER — AZITHROMYCIN 250 MG PO TABS
500.0000 mg | ORAL_TABLET | Freq: Every day | ORAL | Status: AC
  Administered 2024-01-20 – 2024-01-21 (×2): 500 mg via ORAL
  Filled 2024-01-19 (×2): qty 2

## 2024-01-19 MED ORDER — MONTELUKAST SODIUM 10 MG PO TABS
10.0000 mg | ORAL_TABLET | Freq: Every day | ORAL | Status: DC
  Administered 2024-01-19 – 2024-01-22 (×4): 10 mg via ORAL
  Filled 2024-01-19 (×4): qty 1

## 2024-01-19 MED ORDER — MAGNESIUM SULFATE 2 GM/50ML IV SOLN
2.0000 g | Freq: Once | INTRAVENOUS | Status: AC
Start: 2024-01-19 — End: 2024-01-19
  Administered 2024-01-19: 2 g via INTRAVENOUS

## 2024-01-19 MED ORDER — SODIUM CHLORIDE 0.9 % IV SOLN
500.0000 mg | Freq: Once | INTRAVENOUS | Status: AC
  Administered 2024-01-19: 500 mg via INTRAVENOUS
  Filled 2024-01-19: qty 5

## 2024-01-19 MED ORDER — AMLODIPINE BESYLATE 10 MG PO TABS
10.0000 mg | ORAL_TABLET | Freq: Every day | ORAL | Status: DC
Start: 2024-01-20 — End: 2024-01-23
  Administered 2024-01-20 – 2024-01-23 (×4): 10 mg via ORAL
  Filled 2024-01-19 (×4): qty 1

## 2024-01-19 MED ORDER — UMECLIDINIUM BROMIDE 62.5 MCG/ACT IN AEPB
1.0000 | INHALATION_SPRAY | Freq: Every day | RESPIRATORY_TRACT | Status: DC
  Administered 2024-01-20 – 2024-01-23 (×4): 1 via RESPIRATORY_TRACT
  Filled 2024-01-19 (×2): qty 7

## 2024-01-19 MED ORDER — EZETIMIBE 10 MG PO TABS
10.0000 mg | ORAL_TABLET | Freq: Every day | ORAL | Status: DC
  Administered 2024-01-20 – 2024-01-23 (×4): 10 mg via ORAL
  Filled 2024-01-19 (×4): qty 1

## 2024-01-19 MED ORDER — PREDNISONE 20 MG PO TABS
40.0000 mg | ORAL_TABLET | Freq: Every day | ORAL | Status: AC
  Administered 2024-01-20 – 2024-01-23 (×4): 40 mg via ORAL
  Filled 2024-01-19 (×4): qty 2

## 2024-01-19 MED ORDER — SODIUM CHLORIDE 0.9 % IV SOLN
1.0000 g | Freq: Once | INTRAVENOUS | Status: AC
  Administered 2024-01-19: 1 g via INTRAVENOUS
  Filled 2024-01-19: qty 10

## 2024-01-19 MED ORDER — FLUTICASONE FUROATE-VILANTEROL 200-25 MCG/ACT IN AEPB
1.0000 | INHALATION_SPRAY | Freq: Every day | RESPIRATORY_TRACT | Status: DC
  Administered 2024-01-20 – 2024-01-23 (×4): 1 via RESPIRATORY_TRACT
  Filled 2024-01-19 (×2): qty 28

## 2024-01-19 MED ORDER — TIOTROPIUM BROMIDE MONOHYDRATE 18 MCG IN CAPS: 1.0000 | ORAL_CAPSULE | Freq: Every day | RESPIRATORY_TRACT | Status: DC

## 2024-01-19 MED ORDER — IPRATROPIUM-ALBUTEROL 0.5-2.5 (3) MG/3ML IN SOLN
3.0000 mL | Freq: Four times a day (QID) | RESPIRATORY_TRACT | Status: DC | PRN
  Administered 2024-01-19 – 2024-01-20 (×2): 3 mL via RESPIRATORY_TRACT
  Filled 2024-01-19 (×2): qty 3

## 2024-01-19 MED ORDER — SODIUM CHLORIDE 0.9 % IV SOLN
1.0000 g | INTRAVENOUS | Status: AC
  Administered 2024-01-20 – 2024-01-23 (×4): 1 g via INTRAVENOUS
  Filled 2024-01-19 (×4): qty 10

## 2024-01-19 MED ORDER — RIVAROXABAN 10 MG PO TABS
10.0000 mg | ORAL_TABLET | Freq: Every day | ORAL | Status: DC
  Administered 2024-01-19 – 2024-01-23 (×5): 10 mg via ORAL
  Filled 2024-01-19 (×5): qty 1

## 2024-01-19 MED ORDER — ALLOPURINOL 100 MG PO TABS
100.0000 mg | ORAL_TABLET | Freq: Every day | ORAL | Status: DC
  Administered 2024-01-20 – 2024-01-23 (×4): 100 mg via ORAL
  Filled 2024-01-19 (×4): qty 1

## 2024-01-19 NOTE — ED Provider Notes (Signed)
 [place] EMERGENCY DEPARTMENT AT [facility] Provider Note   CSN: [id number] Arrival date & time: 01/19/24  1320     Patient presents with: Respiratory Distress   [name] Hobin. is a 63 y.o. male.   The history is provided by the patient and medical records. No language interpreter was used.  Shortness of Breath Severity:  Severe Onset quality:  Gradual Duration:  3 days Timing:  Constant Progression:  Worsening Chronicity:  Recurrent Context: URI   Relieved by:  Nothing Worsened by:  Coughing Ineffective treatments:  None tried Associated symptoms: cough, sputum production and wheezing   Associated symptoms: no abdominal pain, no chest pain, no fever, no headaches and no vomiting        Prior to Admission medications   Medication Sig Start Date End Date Taking? Authorizing Provider  albuterol  (PROVENTIL ) (2.5 MG/3ML) 0.083% nebulizer solution Take 2.5 mg by nebulization daily as needed for wheezing or shortness of breath.    [provider]  albuterol  (PROVENTIL ) (2.5 MG/3ML) 0.083% nebulizer solution TAKE 3 MLS (2.5 MG TOTAL) BY NEBULIZATION EVERY 6 (SIX) HOURS AS NEEDED FOR SHORTNESS OF BREATH. 01/14/24   Newlin, Enobong, MD  albuterol  (VENTOLIN  HFA) 108 (90 Base) MCG/ACT inhaler Inhale 2 puffs into the lungs in the morning and at bedtime.    [provider]  albuterol  (VENTOLIN  HFA) 108 (90 Base) MCG/ACT inhaler Inhale 2 puffs into the lungs every 6 (six) hours as needed for wheezing or shortness of breath 01/18/24   Delbert Clam, MD  allopurinol  (ZYLOPRIM ) 100 MG tablet Take 1 tablet (100 mg total) by mouth daily. 10/10/23   Newlin, Enobong, MD  amLODipine  (NORVASC ) 10 MG tablet Take 1 tablet (10 mg total) by mouth daily. 10/10/23   Newlin, Enobong, MD  budesonide -formoterol  (SYMBICORT ) 160-4.5 MCG/ACT inhaler Inhale 2 puffs into the lungs 2 (two) times daily. 06/12/23   Mannam, Praveen, MD  budesonide -formoterol  (SYMBICORT ) 160-4.5 MCG/ACT  inhaler Inhale 2 puffs into the lungs 2 (two) times daily.    [provider]  cyanocobalamin  (VITAMIN B12) 1000 MCG tablet Take 1,000 mcg by mouth daily.    [provider]  ezetimibe  (ZETIA ) 10 MG tablet Take 1 tablet (10 mg total) by mouth daily. 11/27/23   Newlin, Enobong, MD  ezetimibe  (ZETIA ) 10 MG tablet Take 10 mg by mouth daily.    [provider]  guaiFENesin  (MUCINEX ) 600 MG 12 hr tablet Take 1,200 mg by mouth daily.    [provider]  ibuprofen  (ADVIL ) 200 MG tablet Take 800 mg by mouth daily as needed for moderate pain (pain score 4-6) or headache.    [provider]  ibuprofen  (ADVIL ) 200 MG tablet Take 800 mg by mouth daily as needed for mild pain (pain score 1-3) or moderate pain (pain score 4-6).    [provider]  metoprolol  succinate (TOPROL -XL) 50 MG 24 hr tablet Take 1 tablet (50 mg total) by mouth daily. 10/10/23   Newlin, Enobong, MD  montelukast  (SINGULAIR ) 10 MG tablet Take 1 tablet (10 mg total) by mouth at bedtime. 10/25/23 01/24/24  Newlin, Enobong, MD  OXYGEN  Inhale 10 L into the lungs continuous.    [provider]  predniSONE  (DELTASONE ) 10 MG tablet Take 4 tablets daily for 3 days followed by 3 tablets daily for 3 days followed by 2 tablets daily for 3 days then 1 tablet daily for 3 days then stop. Patient not taking: Reported on 01/17/2024 12/18/23   Mcarthur Pick, MD  roflumilast  (DALIRESP ) 500 MCG TABS tablet Take 0.5 tablets (250 mcg total) by mouth daily for 30 days, THEN 1 tablet (500 mcg total) daily. 11/30/23 01/29/24  Mannam, Praveen, MD  tiotropium (SPIRIVA  HANDIHALER) 18 MCG inhalation capsule PLACE 1 CAPSULE (18 MCG TOTAL) INTO INHALER AND INHALE DAILY. 12/19/23   Newlin, Enobong, MD  tiZANidine  (ZANAFLEX ) 4 MG tablet Take 1 tablet (4 mg total) by mouth every 8 (eight) hours as needed. 01/02/24   Newlin, Enobong, MD  Vitamin D , Ergocalciferol , (DRISDOL ) 1.25 MG (50000 UNIT) CAPS capsule Take 1 capsule  (50,000 Units total) by mouth every 7 (seven) days. 12/05/23 03/13/24  Newlin, Enobong, MD  Vitamin D , Ergocalciferol , (DRISDOL ) 1.25 MG (50000 UNIT) CAPS capsule Take 50,000 Units by mouth every 7 (seven) days.    [provider]  ALBUTEROL  IN Inhale into the lungs.  07/03/11  [provider]    Allergies: Lipitor [atorvastatin ], Shellfish allergy, Shellfish allergy, Atorvastatin , Beef (diagnostic), Bovine (beef) protein-containing drug products, Colchicine , and Colcrys  [colchicine ]    Review of Systems  Constitutional:  Negative for chills, fatigue and fever.  HENT:  Positive for congestion.   Respiratory:  Positive for cough, sputum production, chest tightness, shortness of breath and wheezing. Negative for stridor.   Cardiovascular:  Negative for chest pain, palpitations and leg swelling.  Gastrointestinal:  Negative for abdominal pain, constipation, diarrhea, nausea and vomiting.  Genitourinary:  Negative for dysuria.  Musculoskeletal:  Negative for back pain.  Skin:  Negative for wound.  Neurological:  Negative for headaches.  Psychiatric/Behavioral:  Negative for agitation and confusion.     Updated Vital Signs BP 124/73 (BP Location: Left Arm)   Pulse 97   Temp (!) 97.1 F (36.2 C) (Axillary)   Resp (!) 26   SpO2 96%   Physical Exam Vitals and nursing note reviewed.  Constitutional:      General: He is in acute distress.     Appearance: He is well-developed. He is ill-appearing.  HENT:     Head: Normocephalic and atraumatic.     Nose: Congestion present. No rhinorrhea.     Mouth/Throat:     Mouth: Mucous membranes are moist.  Eyes:     Extraocular Movements: Extraocular movements intact.     Conjunctiva/sclera: Conjunctivae normal.     Pupils: Pupils are equal, round, and reactive to light.  Cardiovascular:     Rate and Rhythm: Normal rate and regular rhythm.     Heart sounds: No murmur heard. Pulmonary:     Effort: Pulmonary effort is normal. No  respiratory distress.     Breath sounds: Wheezing and rhonchi present. No rales.  Chest:     Chest wall: No tenderness.  Abdominal:     General: Abdomen is flat.     Palpations: Abdomen is soft.     Tenderness: There is no abdominal tenderness. There is no guarding or rebound.  Musculoskeletal:        General: No swelling.     Cervical back: Neck supple.     Right lower leg: No edema.     Left lower leg: No edema.  Skin:    General: Skin is warm and dry.     Capillary Refill: Capillary refill takes less than 2 seconds.     Findings: No erythema or rash.  Neurological:     General: No focal deficit present.     Mental Status: He is alert.  Psychiatric:        Mood and Affect: Mood normal.     (  all labs ordered are listed, but only abnormal results are displayed) Labs Reviewed  BASIC METABOLIC PANEL WITH GFR - Abnormal; Notable for the following components:      Result Value   Glucose, Bld 116 (*)    All other components within normal limits  CBC - Abnormal; Notable for the following components:   WBC 13.6 (*)    Hemoglobin 12.1 (*)    MCV 74.9 (*)    MCH 22.7 (*)    Platelets 556 (*)    All other components within normal limits  RESP PANEL BY RT-PCR (RSV, FLU A&B, COVID)  RVPGX2  BRAIN NATRIURETIC PEPTIDE  TROPONIN I (HIGH SENSITIVITY)    EKG: EKG Interpretation Date/Time:  Saturday January 19 2024 13:26:11 EDT Ventricular Rate:  106 PR Interval:  154 QRS Duration:  97 QT Interval:  350 QTC Calculation: 465 R Axis:   65  Text Interpretation: Fast sinus arrhythmia when compared to prior, faster rate No STEMI Confirmed by Ginger Barefoot ([id number]) on 01/19/2024 1:31:32 PM  Radiology: DG Chest Portable 1 View Result Date: 01/19/2024 EXAM: 1 VIEW(S) XRAY OF THE CHEST 01/19/2024 01:37:00 PM COMPARISON: 12/11/2023 CLINICAL HISTORY: resp distress. Res distress FINDINGS: LUNGS AND PLEURA: Chronic hyperinflation and interstitial coarsening. Increasing bibasilar opacities,  right greater than left. Possible developing pneumonia in right lung base. Trace right pleural effusion. No pulmonary edema. No pneumothorax. HEART AND MEDIASTINUM: No acute abnormality of the cardiac and mediastinal silhouettes. BONES AND SOFT TISSUES: No acute osseous abnormality. IMPRESSION: 1. Increasing bibasilar opacities, right greater than left, with possible developing pneumonia in the right lung base. 2. Trace bilateral pleural effusion. 3. Chronic changes of COPD/emphysema Electronically signed by: Waddell Calk MD 01/19/2024 01:57 PM EDT RP Workstation: [id number]     Procedures   CRITICAL CARE Performed by: Lonni PARAS Rosalio Catterton Total critical care time: 35 minutes Critical care time was exclusive of separately billable procedures and treating other patients. Critical care was necessary to treat or prevent imminent or life-threatening deterioration. Critical care was time spent personally by me on the following activities: development of treatment plan with patient and/or surrogate as well as nursing, discussions with consultants, evaluation of patient's response to treatment, examination of patient, obtaining history from patient or surrogate, ordering and performing treatments and interventions, ordering and review of laboratory studies, ordering and review of radiographic studies, pulse oximetry and re-evaluation of patient's condition.  Medications Ordered in the ED  cefTRIAXone  (ROCEPHIN ) 1 g in sodium chloride  0.9 % 100 mL IVPB (has no administration in time range)  azithromycin  (ZITHROMAX ) 500 mg in sodium chloride  0.9 % 250 mL IVPB (has no administration in time range)  magnesium  sulfate IVPB 2 g 50 mL (2 g Intravenous New Bag/Given 01/19/24 1331)                                    Medical Decision Making Amount and/or Complexity of Data Reviewed Labs: ordered. Radiology: ordered.  Risk Prescription drug management.    [name] Thain. is a 63 y.o. male with a past  medical history significant for COPD exacerbation with home oxygen  and home BiPAP at night, CKD, hypertension, gout, and asthma who presents with respiratory distress.  According to EMS, patient has been having breathing difficulties for the last few days including some congestion and cough.  He has had no fevers or chills and EMS reports he is extremely tight tripoding and tachypneic when  they got to him.  Patient received DuoNeb and Solu-Medrol  prior to arrival and he was still very tight.  On arrival he was switched to our BiPAP and he started to improve.    Otherwise he denies chest pain, palpitations, nausea, vomiting, constipation, diarrhea, or urinary changes.  Denies any trauma.  On exam, lungs are extremely tight and wheezing with some rhonchi.  No rales.  Chest and abdomen nontender.  No murmur.  Intact pulses.  Legs nontender and nonedematous.  Patient did improve on BiPAP and was given magnesium .  He will get labs, x-ray, COVID swab, and rule out pneumonia given his cough and congestion.  X-ray does show evidence of pneumonia.  He will get antibiotics and we will admit for further management given his difficulty breathing.  He is clinically improving as time goes on but he is still on BiPAP.  He will be admitted for further management of hypoxia, wheezing, COPD exacerbation in the setting of pneumonia.      Final diagnoses:  COPD exacerbation (HCC)  Pneumonia due to infectious organism, unspecified laterality, unspecified part of lung  Hypoxia  Shortness of breath    Clinical Impression: 1. COPD exacerbation (HCC)   2. Pneumonia due to infectious organism, unspecified laterality, unspecified part of lung   3. Hypoxia   4. Shortness of breath     Disposition: Admit  This note was prepared with assistance of Dragon voice recognition software. Occasional wrong-word or sound-a-like substitutions may have occurred due to the inherent limitations of voice recognition  software.     Caswell Alvillar, Lonni PARAS, MD 01/19/24 1444

## 2024-01-19 NOTE — ED Triage Notes (Signed)
 Per [facility] pt coming from home arrives on c-pap. EDP and RT at bedside on arrival. Patient wears bipap at night and [place] during the day. Could not tolerate his Progress so remained on Bipap all day. Given 1 duoneb and solumedrol PTA.  18G LFA

## 2024-01-19 NOTE — H&P (Signed)
 Date: 01/19/2024               Patient Name:  [name] [name]. MRN: [id number]  DOB: Aug 19, 1960 Age / Sex: 63 y.o., male   PCP: Delbert Clam, MD         Medical Service: Internal Medicine Teaching Service         Attending Physician: Dr. Eben Reyes BROCKS, MD      First Contact: Melvenia Morrison, MD  Pager:  [phone]  Second Contact: Dr. Norman Lobstein, DO  Pager:  [phone]       After Hours  (After 5pm / First Contact Pager: [phone]  weekends / holidays): Second Contact Pager: [phone]   SUBJECTIVE   Chief Complaint: Difficulty breathing  History of Present Illness: Nikolaos Maddocks. is a 63 y.o. 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 He notes that his symptoms started yesterday. He had been having worsening shortness of breath that required him to wear his BIPAP that he normally only wears overnight. His daughter had been sick with a cold and tried to avoid him but he did come in contact with her a couple of times. His cough and sputum production are at baseline for him, but he does have increased congestion. He reports adherence to his medications, and has not run out of any of them. He normally wears 10L of O2 during the day, but sometimes needs to increase the amount to 15L if he is short of breath. He denies chest pain, headache, nausea, vomiting, abdominal pain, confusion, sleepiness. He does endorse diarrhea but states that this is chronic for him. He believes that he caught a cold from his daughter. He reports that he already feels better than when he first got to the ED.  He was brought in by EMS and was given DuoNebs and SoluMedrol prior to arrival. He was switched to BIPAP in the ED and started to improve. He received 2g magnesium  as well in the ED and started on Azithromycin  and Ceftriaxone  for CAP coverage. He improved significantly in the ED.  ED  Course: Vitals were significant for tachycardia to 104 and tachypnea to 26. He was saturating at 93% while on BiPAP Labs significant for leukocytosis to 13.6, negative Covid, Flu swab. CXR revealed increasing bibasilar opacities, right greater than left, with possible developing pneumonia in the right lung base.  Meds:  Albuterol  inhaler q6 as needed Allopurinol  100mg  daily Amlodipine  10mg  daily Symbicort  2 puffs daily BID Vitamin B12 1000 mcg Zetia  10mg  daily Metoprolol  50mg  daily Montelukast  10mg  daily Roflumilast  500 mcg (has one pill left) Spiriva  handihaler Tizanidine  4mg  q8 as needed Vitamin D  50000 units weekly until December Current Meds  Medication Sig   SPIRIVA  HANDIHALER 18 MCG CAPS Irrigate with 1 capsule as directed daily.    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 Enobong Newlin. [facility] Substances: -Tobacco: Former smoker, not currently smoking -Alcohol: Former alcohol use -Recreational Drug: Former cocaine use  Family History:  Family History  Problem Relation Age of Onset   Cancer Mother        colon    Heart disease Father    Colon cancer Other        mother ? age of diagnosis    Healthy Son    Healthy Daughter     Allergies: Allergies as of 01/19/2024 - Reviewed 01/19/2024  Allergen Reaction Noted   Lipitor [atorvastatin ] Itching and Swelling  10/30/2014   Shellfish allergy Anaphylaxis 07/28/2012   Shellfish allergy Anaphylaxis 12/12/2023   Atorvastatin  Itching and Swelling 12/12/2023   Beef (diagnostic) Other (See Comments) 09/09/2023   Bovine (beef) protein-containing drug products Hives and Other (See Comments) 12/12/2023   Colchicine  Hives 12/12/2023   Colcrys  [colchicine ] Hives 10/13/2014    Review of Systems: A complete ROS was negative except as per HPI.   OBJECTIVE:   Physical Exam: Blood pressure 124/73, pulse 97, temperature (!) 97.1 F (36.2 C), temperature source Axillary, resp. rate (!) 26, SpO2 96%.  Constitutional: alert, well-appearing  sitting in , in no acute distress HENT: normocephalic atraumatic, dry mucous membranes Eyes: conjunctiva non-erythematous Neck: supple Cardiovascular: regular rate and rhythm, no m/r/g Pulmonary/Chest: normal work of breathing on room air, lungs clear to auscultation bilaterally Abdominal: bowel sounds present, soft, non-tender, non-distended MSK: normal bulk and tone Neurological: alert & oriented x 3,  Skin: wrinkled, dry appearing Psych: Normal  Labs: CBC    Component Value Date/Time   WBC 13.6 (H) 01/19/2024 1326   RBC 5.34 01/19/2024 1326   HGB 12.1 (L) 01/19/2024 1326   HGB 14.0 12/26/2023 1436   HCT 40.0 01/19/2024 1326   HCT 46.4 12/26/2023 1436   PLT 556 (H) 01/19/2024 1326   PLT 297 12/26/2023 1436   MCV 74.9 (L) 01/19/2024 1326   MCV 78 (L) 12/26/2023 1436   MCH 22.7 (L) 01/19/2024 1326   MCHC 30.3 01/19/2024 1326   RDW 15.3 01/19/2024 1326   RDW 13.6 12/26/2023 1436   LYMPHSABS 0.8 12/26/2023 1436   MONOABS 1.0 12/13/2023 0906   EOSABS 0.0 12/26/2023 1436   BASOSABS 0.0 12/26/2023 1436     CMP     Component Value Date/Time   NA 138 01/19/2024 1326   NA 141 05/25/2022 1043   K 4.1 01/19/2024 1326   CL 98 01/19/2024 1326   CO2 29 01/19/2024 1326   GLUCOSE 116 (H) 01/19/2024 1326   BUN 9 01/19/2024 1326   BUN 8 05/25/2022 1043    CREATININE 1.04 01/19/2024 1326   CREATININE 1.50 (H) 09/17/2015 0955   CALCIUM  9.7 01/19/2024 1326   PROT 7.9 10/31/2023 1943   PROT 6.4 05/25/2022 1043   ALBUMIN 3.5 10/31/2023 1943   ALBUMIN 3.7 (L) 05/25/2022 1043   AST 16 10/31/2023 1943   ALT 13 10/31/2023 1943   ALKPHOS 83 10/31/2023 1943   BILITOT 0.9 10/31/2023 1943   BILITOT 0.9 05/25/2022 1043   GFRNONAA >60 01/19/2024 1326   GFRNONAA 51 (L) 09/17/2015 0955   GFRAA >60 10/30/2018 0703   GFRAA 59 (L) 09/17/2015 0955   Negative RVP Negative Covid/flu swab  Imaging:  DG Chest Portable 1 View EXAM: 1 VIEW(S) XRAY OF THE CHEST 01/19/2024 01:37:00 PM  COMPARISON: 12/11/2023  CLINICAL HISTORY: resp distress. Res  distress  FINDINGS:  LUNGS AND PLEURA: Chronic hyperinflation and interstitial coarsening. Increasing bibasilar opacities, right greater than left. Possible developing pneumonia in right lung base. Trace right pleural effusion. No pulmonary edema. No pneumothorax.  HEART AND MEDIASTINUM: No acute abnormality of the cardiac and mediastinal silhouettes.  BONES AND SOFT TISSUES: No acute osseous abnormality.  IMPRESSION: 1. Increasing bibasilar opacities, right greater than left, with possible developing pneumonia in the right lung base. 2. Trace bilateral pleural effusion. 3. Chronic changes of COPD/emphysema  Electronically signed by: Waddell Calk MD 01/19/2024 01:57 PM EDT RP Workstation: [id number]   EKG: personally reviewed my interpretation is sinus arrhythmia without ischemic changes.  ASSESSMENT & PLAN:   Assessment & Plan by Problem: Principal Problem:   COPD with acute exacerbation (HCC)   [name] is a 63 y.o. 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  #Acute on chronic hypoxic respiratory failure #COPD  exacerbation #Community acquired pneumonia #Leukocytosis Home O2 requirements of 10L. Worsening shortness of breath over the past two days prior to admission without changes in his cough. He has had malaise for the same time frame. Home medications are tiotropium and Symbicort . Afebrile, hypoxic, tachypneic, and tachycardic in the ED. Leukocytosis to 13.6 with unclear baseline. Troponins and BNP within normal limits. RVP negative CXR showed increasing bibasilar opacities, right greater than left, with possible pneumonia in the right lung base. He received Ceftriaxone , Azithromycin , Solumedrol, Duonebs, and Magnesium  in the ED with improvement of his symptoms. He was initially placed on BiPAP in the ED with improvement of his symptoms. As he has recently had a sick contact, it is likely that his COPD exacerbation was triggered by a viral or bacterial illness. Since his RVP was negative, we will treat for CAP.  -Wean O2 to home oxygen  of 10L -BIPAP at night -Ceftriaxone  1g for 5 days, last dose 10/15 -Azithromycin  500mg  daily for 3 days, last dose 10/13 -Prednisone  40mg  for 4 days starting 10/11, last day 10/15 -Duonebs q6 PRN -Breo Ellipta  1 puff daily -Incruse daily -Roflumilast  500mcg daily -Montelukast  10mg  daily -CBC daily  #Microcytic anemia Hgb 12.1 on admission, recent baseline 13-14. MCV 74.9. Asymptomatic currently with no reported symptoms of bleeding.  -Trend CBC  #Thrombocytosis Elevated to 556. Likely acute phase reactant.  #Hypertension Home medications Amlodipine  10mg  and Metoprolol  50mg  daily. BP on admission well controlled -Continue home Amlodipine  10mg  -Holding home Metoprolol  due to unclear indication  #Hyperlipidemia -Continue home Zetia  10mg  daily   #Gout -Continue home allopurinol  100mg  daily  Diet: Normal VTE: Xarelto  10mg  daily Code: Full Surrogate Decision Maker: Daughter Bernice  Prior to Admission Living Arrangement: Home, living with 2  daughters Anticipated Discharge Location: Home Barriers to Discharge: Improvement in respiratory status  Dispo: Admit patient to Observation with expected length of stay less than 2 midnights.  Signed: Napoleon Limes, MD Internal Medicine Resident PGY-1 01/19/2024, 3:55 PM   Please contact IM Residency On-Call Pager at: [phone] or [phone].

## 2024-01-19 NOTE — Progress Notes (Signed)
 Patient requested BIPAP to be placed on.

## 2024-01-20 DIAGNOSIS — Z91013 Allergy to seafood: Secondary | ICD-10-CM | POA: Diagnosis not present

## 2024-01-20 DIAGNOSIS — Z79899 Other long term (current) drug therapy: Secondary | ICD-10-CM | POA: Diagnosis not present

## 2024-01-20 DIAGNOSIS — Z7951 Long term (current) use of inhaled steroids: Secondary | ICD-10-CM | POA: Diagnosis not present

## 2024-01-20 DIAGNOSIS — Z1152 Encounter for screening for COVID-19: Secondary | ICD-10-CM | POA: Diagnosis not present

## 2024-01-20 DIAGNOSIS — Z8701 Personal history of pneumonia (recurrent): Secondary | ICD-10-CM | POA: Diagnosis not present

## 2024-01-20 DIAGNOSIS — R0902 Hypoxemia: Secondary | ICD-10-CM | POA: Diagnosis not present

## 2024-01-20 DIAGNOSIS — J449 Chronic obstructive pulmonary disease, unspecified: Secondary | ICD-10-CM | POA: Diagnosis not present

## 2024-01-20 DIAGNOSIS — Z9981 Dependence on supplemental oxygen: Secondary | ICD-10-CM | POA: Diagnosis not present

## 2024-01-20 DIAGNOSIS — I129 Hypertensive chronic kidney disease with stage 1 through stage 4 chronic kidney disease, or unspecified chronic kidney disease: Secondary | ICD-10-CM | POA: Diagnosis present

## 2024-01-20 DIAGNOSIS — Z7952 Long term (current) use of systemic steroids: Secondary | ICD-10-CM | POA: Diagnosis not present

## 2024-01-20 DIAGNOSIS — E785 Hyperlipidemia, unspecified: Secondary | ICD-10-CM | POA: Diagnosis not present

## 2024-01-20 DIAGNOSIS — Z888 Allergy status to other drugs, medicaments and biological substances status: Secondary | ICD-10-CM | POA: Diagnosis not present

## 2024-01-20 DIAGNOSIS — Z87892 Personal history of anaphylaxis: Secondary | ICD-10-CM | POA: Diagnosis not present

## 2024-01-20 DIAGNOSIS — J9621 Acute and chronic respiratory failure with hypoxia: Secondary | ICD-10-CM | POA: Diagnosis present

## 2024-01-20 DIAGNOSIS — D649 Anemia, unspecified: Secondary | ICD-10-CM | POA: Diagnosis not present

## 2024-01-20 DIAGNOSIS — J440 Chronic obstructive pulmonary disease with acute lower respiratory infection: Secondary | ICD-10-CM | POA: Diagnosis present

## 2024-01-20 DIAGNOSIS — Z515 Encounter for palliative care: Secondary | ICD-10-CM | POA: Diagnosis not present

## 2024-01-20 DIAGNOSIS — M109 Gout, unspecified: Secondary | ICD-10-CM | POA: Diagnosis not present

## 2024-01-20 DIAGNOSIS — J9611 Chronic respiratory failure with hypoxia: Secondary | ICD-10-CM | POA: Diagnosis not present

## 2024-01-20 DIAGNOSIS — R269 Unspecified abnormalities of gait and mobility: Secondary | ICD-10-CM | POA: Diagnosis not present

## 2024-01-20 DIAGNOSIS — Z91014 Allergy to mammalian meats: Secondary | ICD-10-CM | POA: Diagnosis not present

## 2024-01-20 DIAGNOSIS — R0602 Shortness of breath: Secondary | ICD-10-CM | POA: Diagnosis not present

## 2024-01-20 DIAGNOSIS — R531 Weakness: Secondary | ICD-10-CM | POA: Diagnosis not present

## 2024-01-20 DIAGNOSIS — R911 Solitary pulmonary nodule: Secondary | ICD-10-CM | POA: Diagnosis not present

## 2024-01-20 DIAGNOSIS — Z66 Do not resuscitate: Secondary | ICD-10-CM | POA: Diagnosis present

## 2024-01-20 DIAGNOSIS — N182 Chronic kidney disease, stage 2 (mild): Secondary | ICD-10-CM | POA: Diagnosis present

## 2024-01-20 DIAGNOSIS — Z792 Long term (current) use of antibiotics: Secondary | ICD-10-CM | POA: Diagnosis not present

## 2024-01-20 DIAGNOSIS — J189 Pneumonia, unspecified organism: Secondary | ICD-10-CM | POA: Diagnosis present

## 2024-01-20 DIAGNOSIS — Z800 Family history of malignant neoplasm of digestive organs: Secondary | ICD-10-CM | POA: Diagnosis not present

## 2024-01-20 DIAGNOSIS — Z87891 Personal history of nicotine dependence: Secondary | ICD-10-CM | POA: Diagnosis not present

## 2024-01-20 DIAGNOSIS — J441 Chronic obstructive pulmonary disease with (acute) exacerbation: Secondary | ICD-10-CM | POA: Diagnosis not present

## 2024-01-20 DIAGNOSIS — I10 Essential (primary) hypertension: Secondary | ICD-10-CM | POA: Diagnosis not present

## 2024-01-20 DIAGNOSIS — D509 Iron deficiency anemia, unspecified: Secondary | ICD-10-CM | POA: Diagnosis present

## 2024-01-20 DIAGNOSIS — Z8249 Family history of ischemic heart disease and other diseases of the circulatory system: Secondary | ICD-10-CM | POA: Diagnosis not present

## 2024-01-20 LAB — CBC
HCT: 34.9 % — ABNORMAL LOW (ref 39.0–52.0)
Hemoglobin: 10.6 g/dL — ABNORMAL LOW (ref 13.0–17.0)
MCH: 22.7 pg — ABNORMAL LOW (ref 26.0–34.0)
MCHC: 30.4 g/dL (ref 30.0–36.0)
MCV: 74.9 fL — ABNORMAL LOW (ref 80.0–100.0)
Platelets: 473 K/uL — ABNORMAL HIGH (ref 150–400)
RBC: 4.66 MIL/uL (ref 4.22–5.81)
RDW: 15.2 % (ref 11.5–15.5)
WBC: 10.6 K/uL — ABNORMAL HIGH (ref 4.0–10.5)
nRBC: 0 % (ref 0.0–0.2)

## 2024-01-20 LAB — BASIC METABOLIC PANEL WITH GFR
Anion gap: 8 (ref 5–15)
BUN: 12 mg/dL (ref 8–23)
CO2: 30 mmol/L (ref 22–32)
Calcium: 9.3 mg/dL (ref 8.9–10.3)
Chloride: 98 mmol/L (ref 98–111)
Creatinine, Ser: 1.02 mg/dL (ref 0.61–1.24)
GFR, Estimated: >60 mL/min (ref >60)
Glucose, Bld: 110 mg/dL — ABNORMAL HIGH (ref 70–99)
Potassium: 4.6 mmol/L (ref 3.5–5.1)
Sodium: 136 mmol/L (ref 135–145)

## 2024-01-20 MED ORDER — IPRATROPIUM-ALBUTEROL 0.5-2.5 (3) MG/3ML IN SOLN
3.0000 mL | Freq: Three times a day (TID) | RESPIRATORY_TRACT | Status: DC
  Administered 2024-01-20 – 2024-01-23 (×10): 3 mL via RESPIRATORY_TRACT
  Filled 2024-01-20 (×10): qty 3

## 2024-01-20 NOTE — Care Management Obs Status (Signed)
 MEDICARE OBSERVATION STATUS NOTIFICATION   Patient Details  Name: [name] [name]. MRN: [id number] Date of Birth: 1960/11/07   Medicare Observation Status Notification Given:  Yes    Amaliya Whitelaw G., RN 01/20/2024, 2:32 PM

## 2024-01-20 NOTE — Progress Notes (Signed)
 HD#0 SUBJECTIVE:  Patient Summary: [name] Maura. is a 63 y.o. 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   Overnight Events: None  Interim History: He feels better this morning but not at baseline. He has been requiring BIPAP to go to and from his bedside commode. He would like to be able to get to his commode and back on 10L of O2 prior to discharge. Denies fevers, chills, cough, chest pain.   OBJECTIVE:  Vital Signs: Vitals:   01/19/24 2348 01/20/24 0438 01/20/24 0810 01/20/24 0830  BP: 113/72 120/77 (!) 143/80   Pulse: 82 76 91 91  Resp: 20 20 (!) 22 (!) 22  Temp: (!) 97.5 F (36.4 C) (!) 96.9 F (36.1 C)    TempSrc: Axillary Axillary    SpO2: 97% 100% 96%    Supplemental O2: BIPAP overnight SpO2: 96 % O2 Flow Rate (L/min): 10 L/min (Wears at home) FiO2 (%): 60 %  There were no vitals filed for this visit.   Intake/Output Summary (Last 24 hours) at 01/20/2024 0954 Last data filed at 01/20/2024 0506 Gross per 24 hour  Intake 410 ml  Output 250 ml  Net 160 ml   Net IO Since Admission: 160 mL [01/20/24 0954]  Physical Exam: Physical Exam Constitutional:      General: He is not in acute distress. HENT:     Nose: Congestion present.  Eyes:     Extraocular Movements: Extraocular movements intact.  Cardiovascular:     Rate and Rhythm: Normal rate and regular rhythm.  Pulmonary:     Effort: Pulmonary effort is normal. No respiratory distress.     Breath sounds: Normal breath sounds. No stridor. No wheezing or rales.  Neurological:     Mental Status: He is alert.      Patient Lines/Drains/Airways Status     Active Line/Drains/Airways     Name Placement date Placement time Site Days   Peripheral IV 01/19/24 18 G Anterior;Left Forearm 01/19/24  1330  Forearm  1             Pertinent labs and imaging:      Latest Ref Rng & Units 01/20/2024    3:42 AM 01/19/2024    1:26 PM 12/26/2023    2:36 PM  CBC  WBC 4.0 - 10.5 K/uL 10.6  13.6  19.8   Hemoglobin 13.0 - 17.0 g/dL 89.3  87.8  85.9   Hematocrit 39.0 - 52.0 % 34.9  40.0  46.4   Platelets 150 - 400 K/uL 473  556  297        Latest Ref Rng & Units 01/20/2024    3:42 AM 01/19/2024    1:26 PM 12/14/2023    8:44 AM  CMP  Glucose 70 - 99 mg/dL 889  883  813   BUN 8 - 23 mg/dL 12  9  14    Creatinine 0.61 - 1.24 mg/dL 8.97  8.95  8.87   Sodium 135 - 145 mmol/L 136  138  137   Potassium 3.5 - 5.1 mmol/L 4.6  4.1  3.8   Chloride 98 - 111 mmol/L 98  98  98   CO2 22 - 32 mmol/L 30  29  29    Calcium  8.9 - 10.3 mg/dL 9.3  9.7  9.4     DG Chest  Portable 1 View Result Date: 01/19/2024 EXAM: 1 VIEW(S) XRAY OF THE CHEST 01/19/2024 01:37:00 PM COMPARISON: 12/11/2023 CLINICAL HISTORY: resp distress. Res distress FINDINGS: LUNGS AND PLEURA: Chronic hyperinflation and interstitial coarsening. Increasing bibasilar opacities, right greater than left. Possible developing pneumonia in right lung base. Trace right pleural effusion. No pulmonary edema. No pneumothorax. HEART AND MEDIASTINUM: No acute abnormality of the cardiac and mediastinal silhouettes. BONES AND SOFT TISSUES: No acute osseous abnormality. IMPRESSION: 1. Increasing bibasilar opacities, right greater than left, with possible developing pneumonia in the right lung base. 2. Trace bilateral pleural effusion. 3. Chronic changes of COPD/emphysema Electronically signed by: Waddell Calk MD 01/19/2024 01:57 PM EDT RP Workstation: [id number]    ASSESSMENT/PLAN:  Assessment: Principal Problem:   COPD with acute exacerbation (HCC)  [name] Nechama Raddle. is a 63 y.o. with a pertinent PMH of end stage COPD on 10L O2, spiculated pulmonary nodules, gout, CKD, HLD, HTN , who presented with worsening shortness of breath and admitted for COPD exacerbation secondary to  community acquired pneumonia.   Plan: #Acute on chronic hypoxic respiratory failure #COPD exacerbation secondary to community acquired pneumonia #Leukocytosis Home O2 requirements of 10L. Worsening shortness of breath over two days prior to admission without changes in his cough. He has had malaise for the same time frame. Home medications are tiotropium and Symbicort . Afebrile, hypoxic, tachypneic, and tachycardic in the ED. Leukocytosis to 13.6 with unclear baseline. Troponins and BNP within normal limits. RVP negative. CXR showed increasing bibasilar opacities, right greater than left, with possible pneumonia in the right lung base. He received Ceftriaxone , Azithromycin , Solumedrol, Duonebs, and Magnesium  in the ED with improvement of his symptoms. He was initially placed on BiPAP in the ED with improvement of his symptoms. As he has recently had a sick contact, it is likely that his COPD exacerbation was triggered by a viral or bacterial illness. Since his RVP was negative and he has infiltrates on his x-ray, we will treat for CAP. This morning, he is not yet at his baseline and is requiring BiPAP to walk to the bathroom, but he is improved from yesterday.   -Wean O2 to home oxygen  of 10L with ambulation. -BIPAP at night -Ceftriaxone  1g for 5 days, last dose 10/15, transition to Augmentin 875-125mg  BID at discharge -Azithromycin  500mg  daily for 3 days, last dose 10/13 -Prednisone  40mg  for 4 days starting 10/11, last day 10/15 -Duonebs q6 PRN -Breo Ellipta  1 puff daily -Incruse Ellipta  daily -Roflumilast  500mcg daily -Montelukast  10mg  daily -CBC daily   #Microcytic anemia Hgb 12.1 on admission, recent baseline 13-14. MCV 74.9. Asymptomatic currently with no reported symptoms of bleeding. Hgb 10.6 this morning.   -Trend CBC   #Thrombocytosis Elevated to 556 on admission, down trending to 473 today. Likely acute phase reactant.   #Hypertension Home medications Amlodipine  10mg  and  Metoprolol  50mg  daily. BP on admission well controlled -Continue home Amlodipine  10mg  -Holding home Metoprolol  due to unclear indication and contraindication with COPD   #Hyperlipidemia -Continue home Zetia  10mg  daily   #Gout -Continue home allopurinol  100mg  daily  Best Practice: Diet: Regular diet VTE: rivaroxaban  (XARELTO ) tablet 10 mg Start: 01/19/24 1545 Code: Full  Disposition planning: DISPO: Anticipated discharge tomorrow to Home pending improvement in work of breathing.  Signature:  Melvenia Napoleon [facility]  9:54 AM, 01/20/2024  On Call pager [phone]

## 2024-01-21 DIAGNOSIS — R911 Solitary pulmonary nodule: Secondary | ICD-10-CM

## 2024-01-21 DIAGNOSIS — R0902 Hypoxemia: Secondary | ICD-10-CM

## 2024-01-21 LAB — CBC
HCT: 36.6 % — ABNORMAL LOW (ref 39.0–52.0)
Hemoglobin: 11.3 g/dL — ABNORMAL LOW (ref 13.0–17.0)
MCH: 22.8 pg — ABNORMAL LOW (ref 26.0–34.0)
MCHC: 30.9 g/dL (ref 30.0–36.0)
MCV: 73.9 fL — ABNORMAL LOW (ref 80.0–100.0)
Platelets: 538 K/uL — ABNORMAL HIGH (ref 150–400)
RBC: 4.95 MIL/uL (ref 4.22–5.81)
RDW: 15.3 % (ref 11.5–15.5)
WBC: 20.9 K/uL — ABNORMAL HIGH (ref 4.0–10.5)
nRBC: 0 % (ref 0.0–0.2)

## 2024-01-21 NOTE — Progress Notes (Addendum)
 HD#1 SUBJECTIVE:  Patient Summary: [name] [name]. is a 63 y.o. 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   Overnight Events: None   Interim History: He reports that he had an episode where he became short of breath and desaturated yesterday and required BiPAP. He has been requiring increased O2 to get too and from the bedside commode. He has no new symptoms. No chest pain.   OBJECTIVE:  Vital Signs: Vitals:   01/20/24 2035 01/20/24 2359 01/21/24 0416 01/21/24 0803  BP:  122/82 117/67 (!) 140/80  Pulse:  84 73 83  Resp:  18 18   Temp:  97.7 F (36.5 C) 97.7 F (36.5 C)   TempSrc:  Axillary Axillary   SpO2: 100% 100% 97% 96%   Supplemental O2: BIPAP overnight 10L/min [place] during the day SpO2: 96 % O2 Flow Rate (L/min): 9 L/min FiO2 (%): 60 %  There were no vitals filed for this visit.   Intake/Output Summary (Last 24 hours) at 01/21/2024 0843 Last data filed at 01/21/2024 0419 Gross per 24 hour  Intake --  Output 2450 ml  Net -2450 ml   Net IO Since Admission: -2,290 mL [01/21/24 0843]  Physical Exam: Physical Exam Constitutional:      General: He is not in acute distress. HENT:     Nose: Congestion present.  Eyes:     Extraocular Movements: Extraocular movements intact.  Cardiovascular:     Rate and Rhythm: Normal rate and regular rhythm.  Pulmonary:     Effort: Pulmonary effort is normal. No respiratory distress.     Breath sounds: Normal breath sounds. No stridor. No wheezing or rales.  Neurological:     Mental Status: He is alert.      Patient Lines/Drains/Airways Status     Active Line/Drains/Airways     Name Placement date Placement time Site Days   Peripheral IV 01/19/24 18 G Anterior;Left Forearm 01/19/24  1330  Forearm  1            Pertinent labs and  imaging:      Latest Ref Rng & Units 01/21/2024    5:06 AM 01/20/2024    3:42 AM 01/19/2024    1:26 PM  CBC  WBC 4.0 - 10.5 K/uL 20.9  10.6  13.6   Hemoglobin 13.0 - 17.0 g/dL 88.6  89.3  87.8   Hematocrit 39.0 - 52.0 % 36.6  34.9  40.0   Platelets 150 - 400 K/uL 538  473  556        Latest Ref Rng & Units 01/20/2024    3:42 AM 01/19/2024    1:26 PM 12/14/2023    8:44 AM  CMP  Glucose 70 - 99 mg/dL 889  883  813   BUN 8 - 23 mg/dL 12  9  14    Creatinine 0.61 - 1.24 mg/dL 8.97  8.95  8.87   Sodium 135 - 145 mmol/L 136  138  137   Potassium 3.5 - 5.1 mmol/L 4.6  4.1  3.8   Chloride 98 - 111 mmol/L 98  98  98   CO2 22 - 32 mmol/L 30  29  29    Calcium  8.9 - 10.3 mg/dL 9.3  9.7  9.4     No results found.   ASSESSMENT/PLAN:  Assessment: Principal Problem:  COPD with acute exacerbation (HCC) Active Problems:   Pneumonia due to infectious organism  [name] [name]. is a 63 y.o. 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   Plan: #Acute on chronic hypoxic respiratory failure #COPD exacerbation secondary to community acquired pneumonia #Leukocytosis  Home O2 requirements of 10L with occasional increased O2 needs when ambulating to the restroom to 15L. Worsening shortness of breath over two days prior to admission without changes in his cough. He has had malaise for the same time frame. Home medications are tiotropium and Symbicort . Afebrile, hypoxic, tachypneic, and tachycardic in the ED. Leukocytosis to 13.6 with unclear baseline. Troponins and BNP within normal limits. RVP negative. CXR showed increasing bibasilar opacities, right greater than left, with possible pneumonia in the right lung base. He received Ceftriaxone , Azithromycin , Solumedrol, Duonebs, and Magnesium  in the ED with improvement of  his symptoms. He was initially placed on BiPAP in the ED with improvement of his symptoms. As he has recently had a sick contact, it is likely that his COPD exacerbation was triggered by a viral or bacterial illness. Since his RVP was negative and he has infiltrates on his x-ray, we will treat for CAP. His leukocytosis increased to 20.9 this morning from 10.6, likely in the setting of his Prednisone . He has not been able to get between the bed and bedside commode without desaturation. On ambulation testing today, he desaturated to 82% when walking between his bed and the doorway to his room while on 10L O2. We will reach out to pulmonology to see if they have any recommendations on discharge for optimization.   -Ambulate with pulse oximetry at 15L, could potentially discharge today.  -Wean O2 to home oxygen  of 10L with ambulation. -BIPAP at night -Ceftriaxone  1g for 5 days, last dose 10/15, transition to Augmentin 875-125mg  BID at discharge -Azithromycin  500mg  daily for 3 days, last dose 10/13 -Prednisone  40mg  for 4 days starting 10/11, last day 10/15 -Duonebs q6 PRN -Breo Ellipta  1 puff daily -Incruse Ellipta  daily -Roflumilast  500mcg daily -Montelukast  10mg  daily -CBC daily   #Microcytic anemia  Hgb 12.1 on admission, recent baseline 13-14. MCV 74.9. Asymptomatic currently with no reported symptoms of bleeding. Hgb 11.3 this morning.   -Trend CBC   #Thrombocytosis Elevated to 556 on admission, 538 today. Likely acute phase reactant.   #Hypertension Home medications Amlodipine  10mg  and Metoprolol  50mg  daily. BP on admission well controlled. Has not required Metoprolol  for adequate BP control in the hospital. -Continue home Amlodipine  10mg  -Holding home Metoprolol  due to unclear indication and contraindication with COPD   #Hyperlipidemia -Continue home Zetia  10mg  daily   #Gout -Continue home allopurinol  100mg  daily  Best Practice: Diet: Regular diet VTE: rivaroxaban  (XARELTO )  tablet 10 mg Start: 01/19/24 1545 Code: Full  Disposition planning: DISPO: Anticipated discharge today or tomorrow to Home pending improvement in work of breathing.  Signature:  Melvenia Napoleon [facility]  8:43 AM, 01/21/2024  On Call pager [phone]

## 2024-01-21 NOTE — TOC Initial Note (Signed)
 Transition of Care (TOC) - Initial/Assessment Note    Patient Details  Name: [name] [name]. MRN: [id number] Date of Birth: 05-29-1960  Transition of Care [facility]) CM/SW Contact:    Sudie Erminio Deems, RN Phone Number: 01/21/2024, 3:05 PM  Clinical Narrative:   Patient presented for COPD exacerbation. Patient is currently from home with support of daughters and five grandchildren. Patient has DME: BiPAP, Rolling Walker, Oxygen  with [employer] at 10 Liters, and a nebulizer machine. Inpatient Case Manager received a consult for outpatient palliative. ICM discussed with patient and patient is agreeable to services with [facility]. Referral submitted to [facility] and they will follow the patient. ICM discussed home health services and the patient politely declined services. Patient did ask for a bedside commode- MD is aware and will place orders. Referral submitted to [employer] and DME will be delivered to the room. Patient states daughter will provide transportation home once stable for discharge. NO further needs identified at this time.            Expected Discharge Plan: Home/Self Care Barriers to Discharge: No Barriers Identified   Patient Goals and CMS Choice Patient states their goals for this hospitalization and ongoing recovery are:: Plans to return home with outpatient palliative services.  Expected Discharge Plan and Services   Discharge Planning Services: CM Consult Post Acute Care Choice: NA                   DME Arranged: Bedside commode DME Agency: [employer] Date DME Agency Contacted: 01/21/24 Time DME Agency Contacted: 1502 Representative spoke with at DME Agency: [facility] Arranged: NA  Prior Living Arrangements/Services   Lives with:: Adult Children Patient language and need for interpreter reviewed:: Yes Do you feel safe going back to the place where you live?: Yes      Need for Family Participation in Patient Care: Yes (Comment) Care giver support system in  place?: Yes (comment) Current home services: DME (Oxygen  10 Liters via [employer], BIPAP, Rolling walker, Nebulizer Machine,) Criminal Activity/Legal Involvement Pertinent to Current Situation/Hospitalization: No - Comment as needed  Activities of Daily Living   ADL Screening (condition at time of admission) Independently performs ADLs?: Yes (appropriate for developmental age) Is the patient deaf or have difficulty hearing?: No Does the patient have difficulty seeing, even when wearing glasses/contacts?: No Does the patient have difficulty concentrating, remembering, or making decisions?: No  Permission Sought/Granted Permission sought to share information with : Family Supports, Case Manager   Emotional Assessment Appearance:: Appears stated age Attitude/Demeanor/Rapport: Engaged Affect (typically observed): Appropriate Orientation: : Oriented to Self, Oriented to Place, Oriented to  Time, Oriented to Situation Alcohol / Substance Use: Not Applicable Psych Involvement: No (comment)  Admission diagnosis:  Shortness of breath [R06.02] Hypoxia [R09.02] COPD exacerbation (HCC) [J44.1] COPD with acute exacerbation (HCC) [J44.1] Pneumonia due to infectious organism, unspecified laterality, unspecified part of lung [J18.9] Patient Active Problem List   Diagnosis Date Noted   COPD exacerbation (HCC) 12/11/2023   COPD with acute exacerbation (HCC) 09/09/2023   Pulmonary nodules 08/01/2023   Respiratory distress 07/31/2023   Viral respiratory infection 06/14/2023   Acute on chronic hypoxic respiratory failure (HCC) 06/13/2023   Acute on chronic respiratory failure with hypoxia (HCC) 05/10/2022   COVID-19 virus infection 05/10/2022   Statin intolerance 04/20/2021   Hyperlipidemia 04/19/2021   Acute hypoxemic respiratory failure due to COVID-19 (HCC) 12/04/2020   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   Chronic hypoxic respiratory failure (HCC) 10/30/2016   Class 2  obesity 09/17/2015   Bilateral  carpal tunnel syndrome 09/09/2015   Ulnar neuropathy of both upper extremities 09/09/2015   Numbness of left hand 01/21/2015   Abnormal EKG 10/27/2014   Moderate to severe pulmonary hypertension (HCC) 12/14/2013   COPD exacerbation (HCC) 12/11/2013   Cocaine abuse in remission (HCC) 12/11/2013   Hypertension 12/11/2013   COPD bronchitis 08/08/2012   Gout 08/08/2012   CKD (chronic kidney disease) stage 2, GFR 60-89 ml/min 08/03/2012   Tobacco abuse, in remission 07/29/2012   Pneumonia due to infectious organism 07/28/2012   PCP:  Delbert Clam, MD Pharmacy:   [facility] [address], [address] [place] [address] Phone: [phone] Fax: [phone]  [facility] [address] [place]), [place] - [address] [place]) [place] [address] Phone: [phone] Fax: [phone]  [facility] [address] [place] [address] Phone: [phone] Fax: [phone]  [facility] [place], [place] - [address] [place] [address] Phone: [phone] Fax: [phone]  [facility] [address], [address] [place] [address] Phone: [phone] Fax: [phone]     Social Drivers of Health (SDOH) Social History: SDOH Screenings   Food Insecurity: No Food Insecurity (01/19/2024)  Housing: Low Risk  (01/19/2024)  Transportation Needs: Unmet Transportation Needs (01/19/2024)  Utilities: Not At Risk (01/19/2024)  Depression (PHQ2-9): Low Risk  (12/20/2023)  Social Connections: Moderately Isolated (01/19/2024)  Tobacco Use: Medium Risk (01/19/2024)   SDOH Interventions:     Readmission Risk Interventions    12/18/2023   12:11 PM 11/02/2023   10:59 AM 09/14/2023    1:09 PM  Readmission Risk Prevention Plan  Post Dischage Appt Complete  Complete  Medication  Screening Complete  Complete  Transportation Screening Complete Complete Complete  PCP or Specialist Appt within 5-7 Days  Complete   Home Care Screening  Complete   Medication Review (RN CM)  Complete

## 2024-01-21 NOTE — Progress Notes (Signed)
 Rest, 10L [place] SpO2 = 94%  Standing, 15L [place] SpO2 = 92%  The start of ambulating, 15L [place] SpO2 = 81%  Standing rest for 2 minutes on 15L [place], recovered to 90% Continued to ambulated from room to nurses station and back, maintaining SpO2 of 90% on 15L [place].

## 2024-01-21 NOTE — TOC CM/SW Note (Addendum)
    Durable Medical Equipment  (From admission, onward)           Start     Ordered   01/21/24 1558  For home use only DME 3 n 1  Once       Comments: patient is confined to one room at home   01/21/24 1557          Dx: general weakness.

## 2024-01-21 NOTE — Progress Notes (Signed)
 Mobility Specialist Progress Note:   01/21/24 1016  Mobility  Activity Ambulated with assistance  Level of Assistance Standby assist, set-up cues, supervision of patient - no hands on  Assistive Device Front wheel walker  Distance Ambulated (ft) 150 ft  Activity Response Tolerated well  Mobility Referral Yes  Mobility visit 1 Mobility  Mobility Specialist Start Time (ACUTE ONLY) 1016  Mobility Specialist Stop Time (ACUTE ONLY) 1030  Mobility Specialist Time Calculation (min) (ACUTE ONLY) 14 min   Pt received ambulating into hallway with student RN. SpO2 91% on 15LO2 (which is baseline with exertion). Mild SOB noted. Pt stopped to take multiple standing rest breaks during short bout of ambulation. Refused to sit in chair despite max encouragement. Left sitting EOB with all needs met.   Therisa Rana Mobility Specialist Please contact via SecureChat or  Rehab office at [phone]

## 2024-01-21 NOTE — TOC CM/SW Note (Signed)
 Transition of Care (TOC) CM/SW Note    CSW attached transportation resources to patients AVS.

## 2024-01-21 NOTE — Discharge Instructions (Signed)
  You were hospitalized for COPD exacerbation. Thank you for allowing us  to be part of your care.   Please call to schedule an appointment with your primary care doctor in the next week.  Please call to schedule an appointment with your pulmonologist in the next week.   In addition to your regular medications, please note these changes made to your medications:   *Please START taking:  Azithromycin  500mg  Monday, Wednesday, and Friday Prednisone  10mg  once daily  *Please STOP taking:  Metoprolol      Transportation Resources: Managed Medicare Transportation:  If you have at managed Medicare plan (UHC/BCBS/Humana etc) please call Member Services on the back of your insurance card to assess potential non-emergency ride benefits.   Medicaid Transportation:  Based on county of residence. If you have Medicaid, you can schedule transportation to medical appointments using the following numbers based off which insurance manages your Medicaid plan: United[employer]- [phone] [facility]- [phone] [employer]- [phone] [facility] Complete Health- [phone] [facility]- [phone] If you have secondary or non-managed Medicaid (Medicaid of [place]) call your local Dept of Social Services to discuss transport  Access [place] (formerly [employer]): [employer] #: [phone] Eligibility assessment and application required. For those with disabilities- wheelchair accessible, cash fare $4 for round trip  Senior Wheels: [employer], [place], and [employer] #: [place] and [phone] Phone #: Everywhere else in [place]- [phone] Provides rides to medical appointments in [facility], based on availability of drivers. Must be 55 and over and unable to drive, limit to one ride per week  Access [place]: [employer] Phone #: [phone] Application required. Provides rides for those 70 and over as well as disabled individuals in  [facility] Operate M-F 5:45am-6:30pm, Saturday 8:45am-5:15pm, costs $2.50 per one-way trip     [facility] and [employer] ([employer]): [facility] Phone# [phone]  Application required. Is available for those under 60 and without Medicaid but may include a charge  [employer]- Discount Identification Card: [place] Phone #: [phone]  Half-price fares if riders meet one or more of the following: are a veteran, senior citizen (60 years or older), are a Consulting civil engineer (6-18 years), receive Medicaid/Medicare, or have a disability. Documentation of either of these is required to a receive card, which must be shown to a driver at boarding each time.   [employer] [place] Phone #: [phone]  Anyone requiring transportation in [place] is eligible to ride the [employer]. [employer] provides transportation for general purpose trips, medical trips, and almost any non-emergency trip destination. In addition, special programs and pricing are available to qualified riders based on eligibility requirements.  [employer]: [facility] Phone #: [phone]  Serves those who are disabled without Medicaid but may have small fee  [employer]: [facility] Phone #: [phone]  Serves those who are disabled without Medicaid but may have small fee. Will provide transportation to [place] during limited times and for $10 round trip.

## 2024-01-21 NOTE — Progress Notes (Signed)
 At rest, 10L [place] SpO2 = 96%  Standing, 10L [place] SpO2 = 92%  Ambulating ( 10 ft.), 10L [place] SpO2 =82%

## 2024-01-21 NOTE — Consult Note (Signed)
 NAME:  [name] Squier., MRN:  [id number], DOB:  Sep 22, 1960, LOS: 1 ADMISSION DATE:  01/19/2024, CONSULTATION DATE: 01/21/2023 REFERRING MD: JINNY Fenton, MD, CHIEF COMPLAINT: COPD exacerbation  History of Present Illness:   The patient, with severe COPD on 10 L oxygen  at home, presents with recurrent COPD exacerbations.  Dyspnea and hypoxemia - Severe COPD with recent exacerbation - Oxygen  saturation has been dropping more than baseline - Requires 10 liters of supplemental oxygen  at home - Uses Spiriva , Symbicort , and albuterol  inhalers, as well as Daliresp .  On home BiPAP - Not on chronic home steroids, but receives step-down prednisone  regimen after I agree hospital discharge  Infectious respiratory symptoms - Recent pneumonia, possibly triggered by exposure to daughter's cold - Believes this infection contributed to current COPD exacerbation  Pulmonary rehabilitation and exercise tolerance - Performs home exercises including incentive spirometer, leg, arm, and hand exercises - Unable to attend pulmonary rehabilitation due to transportation issues  Pulmonary nodule surveillance - History of lung nodule under evaluation - PET scan scheduled for further assessment  Pertinent  Medical History    has a past medical history of Asthma (04/10/1997), CKD (chronic kidney disease), stage II (08/03/2012), COPD (chronic obstructive pulmonary disease) (HCC), Essential hypertension, Gout, and Thrombocytopenia (07/28/2012).   Significant Hospital Events: Including procedures, antibiotic start and stop dates in addition to other pertinent events     Interim History / Subjective:    Objective    Blood pressure (!) 142/85, pulse (!) 103, temperature 97.7 F (36.5 C), temperature source Oral, resp. rate (!) 21, SpO2 94%.    Vent Mode: BIPAP;PCV Set Rate:  [10 bmp] 10 bmp PEEP:  [5 cmH20] 5 cmH20   Intake/Output Summary (Last 24 hours) at 01/21/2024 1500 Last data filed at 01/21/2024  0803 Gross per 24 hour  Intake --  Output 2450 ml  Net -2450 ml   There were no vitals filed for this visit.  Examination: Gen:      No acute distress HEENT:  EOMI, sclera anicteric Neck:     No masses; no thyromegaly Lungs:    Diminished air entry.  No wheeze CV:         Regular rate and rhythm; no murmurs Abd:      + bowel sounds; soft, non-tender; no palpable masses, no distension Ext:    No edema; adequate peripheral perfusion Neuro: alert and oriented x 3 Psych: normal mood and affect   Labs/imaging reviewed Significant for WBC 20.9 Negative COVID flu, RSV Chest x-ray with bibasilar opacities right greater than left, trace pleural effusion   Resolved problem list   Assessment and Plan  Chronic obstructive pulmonary disease with acute exacerbation Experiencing an acute exacerbation of severe COPD, likely triggered by a viral infection contracted from their daughter. Hospitalized and on 10 liters of oxygen , attempting to reduce to 9 liters when possible. Not on steroids at home but receives a step-down regimen upon hospital discharge. Uses Spiriva , Symbicort , albuterol , and Daliresp  inhalers at home. Hesitant about in person pulmonary rehab due to transportation issues and possibility of exposure but open to virtual rehab options.  Can consider biologic injections such as dupilumab or mepolizumab but patient is not ready to give himself home injections. - Initiate azithromycin  three times a week on discharge to reduce exacerbation frequency. - Start low-dose prednisone  (10 mg) on discharge to maintain stability and prevent hospital readmission. - We will arrange virtual pulmonary rehab through office - Continue supplemental oxygen , bronchodilators, montelukast  - Continue BiPAP at  night  Community-acquired pneumonia - Finishing a course of ceftriaxone , azithromycin   Pulmonary nodule under evaluation A 2.1 cm pulmonary nodule was previously identified in the lingula. A PET  scan is scheduled for February 01, 2024. Declined a biopsy but agreed to follow up with imaging. - Ensure PET scan is completed on February 01, 2024. - Follow up in clinic after PET scan results are available.  Signature:   Loye Reininger MD [facility] See Amion for pager  If no response to pager , please call [phone] until 7pm After 7:00 pm call Elink  [phone] 01/21/2024, 3:09 PM

## 2024-01-22 ENCOUNTER — Other Ambulatory Visit: Payer: Self-pay | Admitting: Pulmonary Disease

## 2024-01-22 ENCOUNTER — Other Ambulatory Visit: Payer: Self-pay

## 2024-01-22 DIAGNOSIS — Z792 Long term (current) use of antibiotics: Secondary | ICD-10-CM

## 2024-01-22 DIAGNOSIS — J189 Pneumonia, unspecified organism: Secondary | ICD-10-CM | POA: Diagnosis not present

## 2024-01-22 DIAGNOSIS — Z7952 Long term (current) use of systemic steroids: Secondary | ICD-10-CM

## 2024-01-22 DIAGNOSIS — J441 Chronic obstructive pulmonary disease with (acute) exacerbation: Secondary | ICD-10-CM | POA: Diagnosis not present

## 2024-01-22 LAB — CBC
HCT: 36.1 % — ABNORMAL LOW (ref 39.0–52.0)
Hemoglobin: 11.1 g/dL — ABNORMAL LOW (ref 13.0–17.0)
MCH: 22.9 pg — ABNORMAL LOW (ref 26.0–34.0)
MCHC: 30.7 g/dL (ref 30.0–36.0)
MCV: 74.4 fL — ABNORMAL LOW (ref 80.0–100.0)
Platelets: 521 K/uL — ABNORMAL HIGH (ref 150–400)
RBC: 4.85 MIL/uL (ref 4.22–5.81)
RDW: 15.4 % (ref 11.5–15.5)
WBC: 20.5 K/uL — ABNORMAL HIGH (ref 4.0–10.5)
nRBC: 0 % (ref 0.0–0.2)

## 2024-01-22 MED ORDER — ROFLUMILAST 500 MCG PO TABS
ORAL_TABLET | ORAL | 0 refills | Status: DC
  Filled 2024-01-22: qty 15, 30d supply, fill #0

## 2024-01-22 MED ORDER — ROFLUMILAST 500 MCG PO TABS
500.0000 ug | ORAL_TABLET | Freq: Every day | ORAL | Status: DC
  Administered 2024-01-22 – 2024-01-23 (×2): 500 ug via ORAL
  Filled 2024-01-22 (×2): qty 1

## 2024-01-22 MED ORDER — IBUPROFEN 200 MG PO TABS
400.0000 mg | ORAL_TABLET | Freq: Once | ORAL | Status: AC
  Administered 2024-01-22: 400 mg via ORAL
  Filled 2024-01-22: qty 2

## 2024-01-22 MED ORDER — BUDESONIDE-FORMOTEROL FUMARATE 160-4.5 MCG/ACT IN AERO
2.0000 | INHALATION_SPRAY | Freq: Two times a day (BID) | RESPIRATORY_TRACT | 0 refills | Status: DC
  Filled 2024-01-22 – 2024-01-23 (×2): qty 10.2, 30d supply, fill #0

## 2024-01-22 NOTE — Progress Notes (Signed)
 NAME:  [name] Trim., MRN:  [id number], DOB:  08-Jul-1960, LOS: 2 ADMISSION DATE:  01/19/2024, CONSULTATION DATE: 01/21/2023 REFERRING MD: JINNY Fenton, MD, CHIEF COMPLAINT: COPD exacerbation  History of Present Illness:   The patient, with severe COPD on 10 L oxygen  at home, presents with recurrent COPD exacerbations.  Dyspnea and hypoxemia - Severe COPD with recent exacerbation - Oxygen  saturation has been dropping more than baseline - Requires 10 liters of supplemental oxygen  at home - Uses Spiriva , Symbicort , and albuterol  inhalers, as well as Daliresp .  On home BiPAP - Not on chronic home steroids, but receives step-down prednisone  regimen after I agree hospital discharge  Infectious respiratory symptoms - Recent pneumonia, possibly triggered by exposure to daughter's cold - Believes this infection contributed to current COPD exacerbation  Pulmonary rehabilitation and exercise tolerance - Performs home exercises including incentive spirometer, leg, arm, and hand exercises - Unable to attend pulmonary rehabilitation due to transportation issues  Pulmonary nodule surveillance - History of lung nodule under evaluation - PET scan scheduled for further assessment  Pertinent  Medical History    has a past medical history of Asthma (04/10/1997), CKD (chronic kidney disease), stage II (08/03/2012), COPD (chronic obstructive pulmonary disease) (HCC), Essential hypertension, Gout, and Thrombocytopenia (07/28/2012).   Significant Hospital Events: Including procedures, antibiotic start and stop dates in addition to other pertinent events     Interim History / Subjective:   No new complaints  Objective    Blood pressure 133/88, pulse (!) 102, temperature 98.1 F (36.7 C), temperature source Oral, resp. rate 19, SpO2 96%.    FiO2 (%):  [50 %] 50 %   Intake/Output Summary (Last 24 hours) at 01/22/2024 1641 Last data filed at 01/22/2024 1300 Gross per 24 hour  Intake --  Output  2350 ml  Net -2350 ml   There were no vitals filed for this visit.  Examination: Gen:      No acute distress HEENT:  EOMI, sclera anicteric Neck:     No masses; no thyromegaly Lungs:    Diminished air entry.  No wheeze CV:         Regular rate and rhythm; no murmurs Abd:      + bowel sounds; soft, non-tender; no palpable masses, no distension Ext:    No edema; adequate peripheral perfusion Neuro: alert and oriented x 3 Psych: normal mood and affect   Resolved problem list   Assessment and Plan  Chronic obstructive pulmonary disease with acute exacerbation Experiencing an acute exacerbation of severe COPD, likely triggered by a viral infection contracted from their daughter. Hospitalized and on 10 liters of oxygen , attempting to reduce to 9 liters when possible. Not on steroids at home but receives a step-down regimen upon hospital discharge. Uses Spiriva , Symbicort , albuterol , and Daliresp  inhalers at home. Hesitant about in person pulmonary rehab due to transportation issues and possibility of exposure but open to virtual rehab options.  Can consider biologic injections such as dupilumab or mepolizumab but patient is not ready to give himself home injections. - Initiate azithromycin  three times a week on discharge to reduce exacerbation frequency. - Start low-dose prednisone  (10 mg) on discharge to maintain stability and prevent hospital readmission. - We will arrange virtual pulmonary rehab through office - Continue supplemental oxygen , bronchodilators, montelukast  - Continue supplemental oxygen .  Add Oxymizer to help to maintain O2 sats  Community-acquired pneumonia - Finishing a course of ceftriaxone , azithromycin   Pulmonary nodule under evaluation A 2.1 cm pulmonary nodule was previously identified  in the lingula. A PET scan is scheduled for February 01, 2024. Declined a biopsy but agreed to follow up with imaging. - Ensure PET scan is completed on February 01, 2024. - Follow up  in clinic after PET scan results are available.  Signature:   Carlus Stay MD [facility] See Amion for pager  If no response to pager , please call [phone] until 7pm After 7:00 pm call Elink  [phone] 01/22/2024, 4:41 PM

## 2024-01-22 NOTE — Telephone Encounter (Signed)
 Pt was advised by Dr. Theophilus to have a f/u appt in October 2025. Pt does not have appt scheduled, please advise if you would like to refill the Daliresp ? Thank you!

## 2024-01-22 NOTE — Progress Notes (Signed)
 HD#2 SUBJECTIVE:  Patient Summary: [name] Sevin. is a 63 y.o. with a pertinent PMH of end stage COPD on 10L O2, pulmonary nodules under surveillance, gout, CKD, HLD, HTN , who presented with worsening shortness of breath and admitted for COPD exacerbation secondary to community acquired pneumonia, who presented with worsening shortness of breath and admitted for COPD exacerbation secondary to community acquired pneumonia.   Overnight Events: None  Interim History: [name] Nechama Raddle. still is nervous about going home, as he does not feel that he is at his baseline. He has been tolerating a diet. He is requiring BiPAP to get from his bed to the toilet.  Walked with Mobility 150 feet. SPO2 91% on 15L O2 after desaturating.   Seen by pulmonology who recommended to start Aztithromycin three times per week, low dose prednisone , and virtual pulmonary rehab.  Set up with [facility] for outpatient palliative care.  OBJECTIVE:  Vital Signs: Vitals:   01/21/24 2112 01/21/24 2342 01/22/24 0010 01/22/24 0402  BP: 129/84 116/81  (!) 139/94  Pulse: 95 80  82  Resp: 18 18 18 18   Temp: 98 F (36.7 C) 97.6 F (36.4 C)  97.6 F (36.4 C)  TempSrc: Oral Axillary  Oral  SpO2: 95% 99%  99%   Supplemental O2: [place] 9L, BiPAP at night  There were no vitals filed for this visit.   Intake/Output Summary (Last 24 hours) at 01/22/2024 0656 Last data filed at 01/22/2024 0608 Gross per 24 hour  Intake --  Output 2400 ml  Net -2400 ml   Net IO Since Admission: -4,690 mL [01/22/24 0656]  Physical Exam: Physical Exam Constitutional:      General: He is not in acute distress.    Appearance: He is not ill-appearing.  HENT:     Mouth/Throat:     Mouth: Mucous membranes are moist.  Cardiovascular:     Rate and Rhythm: Normal rate and regular rhythm.  Pulmonary:     Effort: Pulmonary effort is normal. No respiratory distress.     Breath sounds: No wheezing or rales.     Comments: Mildly  decreased breath sounds on 10L o2 Musculoskeletal:     Right lower leg: No edema.     Left lower leg: No edema.  Neurological:     Mental Status: He is alert.     Patient Lines/Drains/Airways Status     Active Line/Drains/Airways     Name Placement date Placement time Site Days   Peripheral IV 01/19/24 18 G Anterior;Left Forearm 01/19/24  1330  Forearm  3            Pertinent labs and imaging:      Latest Ref Rng & Units 01/22/2024    4:07 AM 01/21/2024    5:06 AM 01/20/2024    3:42 AM  CBC  WBC 4.0 - 10.5 K/uL 20.5  20.9  10.6   Hemoglobin 13.0 - 17.0 g/dL 88.8  88.6  89.3   Hematocrit 39.0 - 52.0 % 36.1  36.6  34.9   Platelets 150 - 400 K/uL 521  538  473        Latest Ref Rng & Units 01/20/2024    3:42 AM 01/19/2024    1:26 PM 12/14/2023    8:44 AM  CMP  Glucose 70 - 99 mg/dL 889  883  813   BUN 8 - 23 mg/dL 12  9  14    Creatinine 0.61 - 1.24 mg/dL 8.97  8.95  1.12   Sodium 135 - 145 mmol/L 136  138  137   Potassium 3.5 - 5.1 mmol/L 4.6  4.1  3.8   Chloride 98 - 111 mmol/L 98  98  98   CO2 22 - 32 mmol/L 30  29  29    Calcium  8.9 - 10.3 mg/dL 9.3  9.7  9.4     No results found.  ASSESSMENT/PLAN:  Assessment: Principal Problem:   COPD with acute exacerbation (HCC) Active Problems:   Pneumonia due to infectious organism  [name] Santellan. is a 63 y.o. with a pertinent PMH of end stage COPD on 10L O2, pulmonary nodules under surveillance, gout, CKD, HLD, HTN , who presented with worsening shortness of breath and admitted for COPD exacerbation secondary to community acquired pneumonia, who presented with worsening shortness of breath and admitted for COPD exacerbation secondary to community acquired pneumonia.   Plan: #Acute on chronic hypoxic respiratory failure #COPD exacerbation secondary to community acquired pneumonia #Leukocytosis  Home O2 requirements of 10L with occasional increased O2 needs when ambulating to the restroom to 15L. Worsening  shortness of breath and malaise over two days prior to admission without changes in his cough in the setting of a sick contact. Home medications are tiotropium and Symbicort . Afebrile, hypoxic, tachypneic, and tachycardic in the ED. Leukocytosis to 13.6 with unclear baseline. Troponins and BNP within normal limits. RVP negative. CXR showed increasing bibasilar opacities, right greater than left, with possible pneumonia in the right lung base. He received Ceftriaxone , Azithromycin , Solumedrol, Duonebs, and Magnesium  in the ED. He was initially placed on BiPAP in the ED with improvement of his symptoms. Since his RVP was negative and he has infiltrates on his x-ray, we will treat for CAP. His WBC increased after prednisone  administration to 20.9 on 10/13 and remained stable at 20.5 today. On, 10/13 he did ambulate more than the days prior, however he desaturated while on 15L. Pulmonology was consulted this admission, and recommended that he start prophylactic Azithromycin  and oral low dose prednisone . He has been trying to wean his resting O2 to 9L. He walked with mobility today and was able to maintain his saturations at 15L, although he did desaturate on 10L O2. It seems like this patient is nearing his baseline.   -Ambulate with pulse oximetry today on 15L O2 -Pulmonology consulted, appreciate recommendations -Azithromycin  three times weekly to reduce exacerbation frequency -Start low-dose prednisone  10mg  on discharge to maintain stability -Virtual pulmonary rehab -Continue other home medications as below. -BIPAP at night -Ceftriaxone  1g for 5 days, last dose 10/15, transition to Augmentin 875-125mg  BID at discharge -Finished course of Azithromycin  500mg  daily for 3 days, last dose 10/13 -Prednisone  40mg  for 4 days starting 10/11, last day 10/15 -Duonebs q6 PRN -Breo Ellipta  1 puff daily -Incruse Ellipta  daily -Roflumilast  500mcg daily -Montelukast  10mg  daily -CBC daily   #Microcytic anemia  Hgb  12.1 on admission, recent baseline 13-14. MCV 74.9. Asymptomatic currently with no reported symptoms of bleeding. Hgb 11.1 this morning.   -Trend CBC   #Thrombocytosis Elevated to 556 on admission, 521 today. Likely acute phase reactant.   #Hypertension Home medications Amlodipine  10mg  and Metoprolol  50mg  daily. BP on admission well controlled. Has not required Metoprolol  for adequate BP control in the hospital. -Continue home Amlodipine  10mg  -Holding home Metoprolol  due to unclear indication and contraindication with COPD, plan to stop at discharge   #Hyperlipidemia -Continue home Zetia  10mg  daily   #Gout -Continue home allopurinol  100mg  daily  Best Practice: Diet:  Regular diet VTE: rivaroxaban  (XARELTO ) tablet 10 mg Start: 01/19/24 1545 Code: DNR/DNI  Disposition planning: DISPO: Anticipated discharge tentatively today to Home.  Signature:  Melvenia Napoleon [facility]  6:56 AM, 01/22/2024  On Call pager [phone]

## 2024-01-22 NOTE — Progress Notes (Signed)
 [name] [name] [phone] Liaison Note:  Notified by [facility] manager of patient/family request for [facility] services at home after discharge. Please call with any hospice or outpatient palliative care related questions. Thank you for the opportunity to participate in this patient's care.  Eleanor Nail, LPN [facility] Liaison [phone]

## 2024-01-22 NOTE — Progress Notes (Signed)
 Mobility Specialist Progress Note;   01/22/24 0920  Mobility  Activity Ambulated with assistance  Level of Assistance Standby assist, set-up cues, supervision of patient - no hands on  Assistive Device Front wheel walker  Distance Ambulated (ft) 80 ft  Activity Response Tolerated fair  Mobility Referral Yes  Mobility visit 1 Mobility  Mobility Specialist Start Time (ACUTE ONLY) 0920  Mobility Specialist Stop Time (ACUTE ONLY) 0935  Mobility Specialist Time Calculation (min) (ACUTE ONLY) 15 min   Pt agreeable to mobility. On 9LO2 upon arrival. Required no physical assistance during ambulation. Began ambulation on 10LO2. Pt able to ambulate 28ft then SPO2 desat to 84%, requiring 15L and ~1-2 minutes to recover back to 92%. Able to tolerate ambulation on 15L maintaining SPO2 94%. HR up to 144 bpm w/ exertion. Pt returned back to sitting on EoB and left with all needs met. MD and RN notified.   Lauraine Erm Mobility Specialist Please contact via SecureChat or [employer] [phone]

## 2024-01-22 NOTE — Progress Notes (Signed)
   01/22/24 2047  BiPAP/CPAP/SIPAP  BiPAP/CPAP/SIPAP Pt Type Adult  BiPAP/CPAP/SIPAP SERVO  Mask Type Full face mask  Dentures removed? Not applicable  Mask Size Medium  Set Rate 10 breaths/min  Respiratory Rate 22 breaths/min  IPAP 10 cmH20  EPAP 5 cmH2O  PEEP 5 cmH20  FiO2 (%) 50 %  Minute Ventilation 24.8  Leak 18  Peak Inspiratory Pressure (PIP) 14  Tidal Volume (Vt) 908  Patient Home Machine No  Patient Home Mask No  Patient Home Tubing No  Auto Titrate No  Press High Alarm 25 cmH2O  Press Low Alarm 2 cmH2O  CPAP/SIPAP surface wiped down Yes  Device Plugged into RED Power Outlet Yes  BiPAP/CPAP /SiPAP Vitals  Pulse Rate 98  SpO2 96 %  MEWS Score/Color  MEWS Score 0  MEWS Score Color [place]

## 2024-01-23 ENCOUNTER — Other Ambulatory Visit: Payer: Self-pay

## 2024-01-23 DIAGNOSIS — J189 Pneumonia, unspecified organism: Secondary | ICD-10-CM | POA: Diagnosis not present

## 2024-01-23 DIAGNOSIS — J441 Chronic obstructive pulmonary disease with (acute) exacerbation: Secondary | ICD-10-CM | POA: Diagnosis not present

## 2024-01-23 DIAGNOSIS — Z9981 Dependence on supplemental oxygen: Secondary | ICD-10-CM | POA: Diagnosis not present

## 2024-01-23 DIAGNOSIS — Z8249 Family history of ischemic heart disease and other diseases of the circulatory system: Secondary | ICD-10-CM

## 2024-01-23 DIAGNOSIS — I10 Essential (primary) hypertension: Secondary | ICD-10-CM | POA: Diagnosis not present

## 2024-01-23 MED ORDER — TIOTROPIUM BROMIDE 18 MCG IN CAPS
1.0000 | ORAL_CAPSULE | Freq: Every day | RESPIRATORY_TRACT | 0 refills | Status: DC
  Filled 2024-01-23: qty 30, 30d supply, fill #0

## 2024-01-23 MED ORDER — AZITHROMYCIN 500 MG PO TABS
500.0000 mg | ORAL_TABLET | ORAL | 0 refills | Status: AC
  Filled 2024-01-23 (×2): qty 28, 65d supply, fill #0
  Filled 2024-01-23 (×2): qty 8, 19d supply, fill #0

## 2024-01-23 MED ORDER — AZITHROMYCIN 250 MG PO TABS
500.0000 mg | ORAL_TABLET | Freq: Once | ORAL | Status: AC
  Administered 2024-01-23: 500 mg via ORAL
  Filled 2024-01-23: qty 2

## 2024-01-23 MED ORDER — LOPERAMIDE HCL 2 MG PO CAPS
2.0000 mg | ORAL_CAPSULE | Freq: Once | ORAL | Status: AC
  Administered 2024-01-23: 2 mg via ORAL
  Filled 2024-01-23: qty 1

## 2024-01-23 MED ORDER — LOPERAMIDE HCL 2 MG PO CAPS
2.0000 mg | ORAL_CAPSULE | ORAL | 0 refills | Status: DC | PRN
  Filled 2024-01-23 (×2): qty 30, 30d supply, fill #0

## 2024-01-23 MED ORDER — TIZANIDINE HCL 4 MG PO TABS
4.0000 mg | ORAL_TABLET | Freq: Three times a day (TID) | ORAL | Status: DC | PRN
Start: 2024-01-23 — End: 2024-02-05

## 2024-01-23 MED ORDER — ROFLUMILAST 500 MCG PO TABS: 500.0000 ug | ORAL_TABLET | Freq: Every day | ORAL | Status: DC

## 2024-01-23 MED ORDER — PREDNISONE 10 MG PO TABS
10.0000 mg | ORAL_TABLET | Freq: Every day | ORAL | 0 refills | Status: DC
  Filled 2024-01-23: qty 30, 30d supply, fill #0

## 2024-01-23 NOTE — Progress Notes (Signed)
 Mobility Specialist Progress Note;   01/23/24 0947  Mobility  Activity Ambulated with assistance  Level of Assistance Standby assist, set-up cues, supervision of patient - no hands on  Assistive Device Front wheel walker  Distance Ambulated (ft) 120 ft  Activity Response Tolerated well  Mobility Referral Yes  Mobility visit 1 Mobility  Mobility Specialist Start Time (ACUTE ONLY) 0947  Mobility Specialist Stop Time (ACUTE ONLY) 1003  Mobility Specialist Time Calculation (min) (ACUTE ONLY) 16 min   Pt agreeable to mobility. Required no physical assistance during ambulation. Required 15LO2 to maintain SPO2 91%> during ambulation. Took 3x standing rest breaks d/t SPO2 desat to 89%, recovered quickly within 1 min rest. Pt returned back to sitting on EoB and left with all needs met.   Lauraine Erm Mobility Specialist Please contact via SecureChat or [employer] [phone]

## 2024-01-23 NOTE — Discharge Summary (Signed)
 Name: [name] [name]. MRN: [id number] DOB: July 27, 1960 63 y.o. PCP: Delbert Clam, MD  Date of Admission: 01/19/2024  1:20 PM Date of Discharge: 01/23/2024 Attending Physician: Dr. Reyes Fenton  Discharge Diagnosis: 1. Principal Problem:   COPD with acute exacerbation (HCC) Active Problems:   Pneumonia due to infectious organism   Discharge Medications: Allergies as of 01/23/2024       Reactions   Lipitor [atorvastatin ] Itching, Swelling   Facial, tongue swelling   Shellfish Allergy Anaphylaxis   Beef (diagnostic) Other (See Comments)   Gout flares   Bovine (beef) Protein-containing Drug Products Hives, Other (See Comments)   Causes gout    Colcrys  [colchicine ] Hives        Medication List     STOP taking these medications    guaiFENesin  600 MG 12 hr tablet Commonly known as: MUCINEX    metoprolol  succinate 50 MG 24 hr tablet Commonly known as: TOPROL -XL       TAKE these medications    albuterol  (2.5 MG/3ML) 0.083% nebulizer solution Commonly known as: PROVENTIL  TAKE 3 MLS (2.5 MG TOTAL) BY NEBULIZATION EVERY 6 (SIX) HOURS AS NEEDED FOR SHORTNESS OF BREATH.   albuterol  108 (90 Base) MCG/ACT inhaler Commonly known as: Ventolin  HFA Inhale 2 puffs into the lungs every 6 (six) hours as needed for wheezing or shortness of breath   allopurinol  100 MG tablet Commonly known as: ZYLOPRIM  Take 1 tablet (100 mg total) by mouth daily.   amLODipine  10 MG tablet Commonly known as: NORVASC  Take 1 tablet (10 mg total) by mouth daily.   azithromycin  500 MG tablet Commonly known as: Zithromax  Take 1 tablet (500 mg total) by mouth 3 (three) times a week. Take 1 tablet (500 mg total) by mouth 3 (three) times a week. (Monday, Wednesday, Friday)   budesonide -formoterol  160-4.5 MCG/ACT inhaler Commonly known as: Symbicort  Inhale 2 puffs into the lungs 2 (two) times daily.   cyanocobalamin  1000 MCG tablet Commonly known as: VITAMIN B12 Take 1,000 mcg by mouth  daily.   ezetimibe  10 MG tablet Commonly known as: ZETIA  Take 1 tablet (10 mg total) by mouth daily.   loperamide  2 MG capsule Commonly known as: IMODIUM  Take 1 capsule (2 mg total) by mouth as needed for diarrhea or loose stools.   montelukast  10 MG tablet Commonly known as: SINGULAIR  Take 1 tablet (10 mg total) by mouth at bedtime.   OXYGEN  Inhale 10 L into the lungs continuous.   predniSONE  10 MG tablet Commonly known as: DELTASONE  Take 1 tablet (10 mg total) by mouth daily with breakfast. What changed:  how much to take how to take this when to take this additional instructions   roflumilast  500 MCG Tabs tablet Commonly known as: DALIRESP  Take 1 tablet (500 mcg total) by mouth daily. What changed: See the new instructions.   Spiriva  HandiHaler 18 MCG Caps Generic drug: Tiotropium Bromide  PLACE 1 CAPSULE (18 MCG TOTAL) INTO INHALER AND INHALE DAILY.   tiZANidine  4 MG tablet Commonly known as: Zanaflex  Take 1 tablet (4 mg total) by mouth every 8 (eight) hours as needed for muscle spasms.   Vitamin D  (Ergocalciferol ) 1.25 MG (50000 UNIT) Caps capsule Commonly known as: DRISDOL  Take 1 capsule (50,000 Units total) by mouth every 7 (seven) days.               Durable Medical Equipment  (From admission, onward)           Start     Ordered   01/23/24  9044  For home use only DME Other see comment  Once       Comments: Per pulmonology, patient will benefit from an oximizer  Question:  Length of Need  Answer:  Lifetime   01/23/24 0955   01/21/24 1558  For home use only DME 3 n 1  Once       Comments: patient is confined to one room at home   01/21/24 1557            Disposition and follow-up:   [name] was discharged from [facility] in Good condition.  At the hospital follow up visit please address:  1.  COPD  -COPD exacerbation, discharged on 10L at rest and 15L with ambulation  -Discharged on Tiotropium, Albuterol ,  Montelukast , Symbicort , Roflumilast , Azithromycin  500mg  MWF, and Prednisone  10mg  daily. (New medications bolded)  -Please evaluate for symptom control on current oxygen  and medication regimen.  -Please inquire whether the patient has had the oxymizer delivered and if he is using it  -Please ensure follow-up with pulmonology and virtual pulmonary rehab  -Please ensure patient has been established with [facility] palliative care in the outpatient setting.  2. Pulmonary nodules  -Two lung nodules under surveillance.  -PET scan scheduled for 10/24 at 10:00AM  3. Hypertension  -Home medications Amlodipine  and Metoprolol   -Metoprolol  discontinued due to COPD and he was able to maintain adequate blood pressure control in the hospital.  -Evaluate the need for further BP control and consider non-BB medication if necessary.  4.  Labs / imaging needed at time of follow-up: CBC   5.  Pending labs/ test needing follow-up: None  Follow-up Appointments:  Follow-up Information     [facility]  Follow up.   Why: Bedside commode and Oxymizer-ordered and will be delivered to the room. Contact information: [address] [place] [address] [phone]         Delbert Clam, MD. Call in 2 week(s).   Specialty: Family Medicine Why: Call to make an appointment with your PCP in the next week or 2 Contact information: [address] [place] [address] [place] [address] [phone]                  Hospital Course by problem list: [name] [name]. is a 63 y.o. person living with a history COPD on 10L O2, pulmonary nodules under surveillance, gout, CKD, HLD, HTN , who presented with worsening shortness of breath and admitted for COPD exacerbation secondary to community acquired pneumonia  now being discharged on hospital day 3 with the following pertinent hospital course:  #Acute on chronic hypoxic respiratory failure #COPD exacerbation secondary to community acquired  pneumonia #Leukocytosis  Home O2 requirements of 10L with occasional increased O2 needs when ambulating to the restroom to 15L Worsening shortness of breath over two days prior to admission without changes in his cough. He had malaise for the same time frame, as well as a sick contact. Home medications of Tiotropium and Symbicort  with good adherence. Afebrile, hypoxic, tachypneic, and tachycardic in the ED. Leukocytosis to 13.6 with unclear baseline. RVP negative. CXR showed increasing bibasilar opacities, right greater than left, with possible pneumonia in the right lung base. He received Ceftriaxone , Azithromycin , Solumedrol, Duonebs, and Magnesium  in the ED with improvement of his symptoms. He was initially placed on BiPAP in the ED with improvement of his symptoms. He was treated for CAP with 5 days of Ceftriaxone  and 3 days of Azithromycin . He  received Prednisone  40mg  for 5 days. His leukocytosis did increase to 20.9 on 10/13 without any infectious signs, likely in the setting of his Prednisone . On ambulation testing on 10/14, he desaturated to 84% when walking 40 feet, then was able to maintain at 94% on 15L. Pulmonology were consulted and recommended Azithromycin  500mg  MWF and prednisone  10mg  daily. He was discharged on 10L of O2 at rest and 15L with ambulation, and was set up with an oximyzer to use at home   #Microcytic anemia  Hgb 12.1 on admission, recent baseline 13-14. MCV 74.9. Asymptomatic currently with no reported symptoms of bleeding. Hgb 11.1 at discharge   #Thrombocytosis Elevated to 556 on admission, 521 on discharge. Likely elevated as an acute phase reactant.   #Hypertension Home medications Amlodipine  10mg  and Metoprolol  50mg  daily. BP on admission well controlled. His Metoprolol  was discontinued due to potential to exacerbate his COPD and his BP remained controlled on Amlodipine  10mg  daily. He may need to start another medication for his BP in the future that he will be starting  on chronic steroids.   #Hyperlipidemia Managed with home Zetia  10mg  daily   #Gout Managed with home allopurinol  100mg  daily    Discharge Subjective [name] [name]. Feels better today. He does not have shortness of breath on his home O2 at rest. He denies cough, fevers, chills, nausea and vomiting. His daughter will be able to pick him up this afternoon.  Discharge Exam:   BP 126/66 (BP Location: Left Arm)   Pulse 85   Temp 98.1 F (36.7 C) (Oral)   Resp 20   Ht 5' 6 (1.676 m)   Wt 80.7 kg   SpO2 100%   BMI 28.72 kg/m  Discharge exam:  Physical Exam Constitutional:      General: He is not in acute distress.    Appearance: He is not ill-appearing.  Cardiovascular:     Rate and Rhythm: Normal rate and regular rhythm.  Pulmonary:     Effort: Pulmonary effort is normal. No respiratory distress.     Breath sounds: No wheezing or rales.     Comments: Decreased breath sounds at the bases Musculoskeletal:     Right lower leg: No edema.     Left lower leg: No edema.  Neurological:     Mental Status: He is alert.      Pertinent Labs, Studies, and Procedures:     Latest Ref Rng & Units 01/22/2024    4:07 AM 01/21/2024    5:06 AM 01/20/2024    3:42 AM  CBC  WBC 4.0 - 10.5 K/uL 20.5  20.9  10.6   Hemoglobin 13.0 - 17.0 g/dL 88.8  88.6  89.3   Hematocrit 39.0 - 52.0 % 36.1  36.6  34.9   Platelets 150 - 400 K/uL 521  538  473        Latest Ref Rng & Units 01/20/2024    3:42 AM 01/19/2024    1:26 PM 12/14/2023    8:44 AM  CMP  Glucose 70 - 99 mg/dL 889  883  813   BUN 8 - 23 mg/dL 12  9  14    Creatinine 0.61 - 1.24 mg/dL 8.97  8.95  8.87   Sodium 135 - 145 mmol/L 136  138  137   Potassium 3.5 - 5.1 mmol/L 4.6  4.1  3.8   Chloride 98 - 111 mmol/L 98  98  98   CO2 22 - 32 mmol/L 30  29  29   Calcium  8.9 - 10.3 mg/dL 9.3  9.7  9.4     DG Chest Portable 1 View Result Date: 01/19/2024 EXAM: 1 VIEW(S) XRAY OF THE CHEST 01/19/2024 01:37:00 PM COMPARISON: 12/11/2023  CLINICAL HISTORY: resp distress. Res distress FINDINGS: LUNGS AND PLEURA: Chronic hyperinflation and interstitial coarsening. Increasing bibasilar opacities, right greater than left. Possible developing pneumonia in right lung base. Trace right pleural effusion. No pulmonary edema. No pneumothorax. HEART AND MEDIASTINUM: No acute abnormality of the cardiac and mediastinal silhouettes. BONES AND SOFT TISSUES: No acute osseous abnormality. IMPRESSION: 1. Increasing bibasilar opacities, right greater than left, with possible developing pneumonia in the right lung base. 2. Trace bilateral pleural effusion. 3. Chronic changes of COPD/emphysema Electronically signed by: Waddell Calk MD 01/19/2024 01:57 PM EDT RP Workstation: [id number]     Discharge Instructions:  You were hospitalized for COPD exacerbation. Thank you for allowing us  to be part of your care.   Please call to schedule an appointment with your primary care doctor in the next week.  Please call to schedule an appointment with your pulmonologist in the next week.   In addition to your regular medications, please note these changes made to your medications:   *Please START taking:  Azithromycin  500mg  Monday, Wednesday, and Friday Prednisone  10mg  once daily  *Please STOP taking:  Metoprolol   Signed: Napoleon Limes, MD 01/23/2024, 10:22 AM

## 2024-01-23 NOTE — Progress Notes (Signed)
 HD#3 SUBJECTIVE:  Patient Summary: [name] Bruun. is a 63 y.o. with a pertinent PMH of end stage COPD on 10L O2, pulmonary nodules under surveillance, gout, CKD, HLD, HTN , who presented with worsening shortness of breath and admitted for COPD exacerbation secondary to community acquired pneumonia.  Overnight Events: None  Interim History: [name] Nechama Raddle. feels better today. He does not have shortness of breath on his home O2 at rest. He denies cough, fevers, chills, nausea and vomiting. His daughter will be able to pick him up this afternoon.   Walked with mobility and desaturated to 84% on 10L then placed on 15L and able to ambulate to without desaturations.  Seen by pulmonology, who recommended oximyzer, although after discussion with respiratory therapy, we do not have this option inpatient. OBJECTIVE:  Vital Signs: Vitals:   01/22/24 2027 01/22/24 2047 01/23/24 0011 01/23/24 0412  BP:   128/86 126/66  Pulse: (!) 111 98 79 85  Resp: (!) 24  18 20   Temp:   98.2 F (36.8 C) 98.1 F (36.7 C)  TempSrc:   Axillary Oral  SpO2: 95% 96% 95% 92%  Weight:      Height:       Supplemental O2: [place] 10L, BiPAP at night   Filed Weights   01/22/24 1700  Weight: 80.7 kg     Intake/Output Summary (Last 24 hours) at 01/23/2024 0631 Last data filed at 01/23/2024 0449 Gross per 24 hour  Intake --  Output 1750 ml  Net -1750 ml   Net IO Since Admission: -6,440 mL [01/23/24 0631]  Physical Exam: Constitutional:      General: He is not in acute distress.    Appearance: He is not ill-appearing.  Cardiovascular:     Rate and Rhythm: Normal rate and regular rhythm.  Pulmonary:     Effort: Pulmonary effort is normal. No respiratory distress.     Breath sounds: No wheezing or rales.     Comments: Decreased breath sounds at the bases Musculoskeletal:     Right lower leg: No edema.     Left lower leg: No edema.  Neurological:     Mental Status: He is alert.   Patient  Lines/Drains/Airways Status     Active Line/Drains/Airways     Name Placement date Placement time Site Days   Peripheral IV 01/19/24 18 G Anterior;Left Forearm 01/19/24  1330  Forearm  3            Pertinent labs and imaging:      Latest Ref Rng & Units 01/22/2024    4:07 AM 01/21/2024    5:06 AM 01/20/2024    3:42 AM  CBC  WBC 4.0 - 10.5 K/uL 20.5  20.9  10.6   Hemoglobin 13.0 - 17.0 g/dL 88.8  88.6  89.3   Hematocrit 39.0 - 52.0 % 36.1  36.6  34.9   Platelets 150 - 400 K/uL 521  538  473        Latest Ref Rng & Units 01/20/2024    3:42 AM 01/19/2024    1:26 PM 12/14/2023    8:44 AM  CMP  Glucose 70 - 99 mg/dL 889  883  813   BUN 8 - 23 mg/dL 12  9  14    Creatinine 0.61 - 1.24 mg/dL 8.97  8.95  8.87   Sodium 135 - 145 mmol/L 136  138  137   Potassium 3.5 - 5.1 mmol/L 4.6  4.1  3.8   Chloride  98 - 111 mmol/L 98  98  98   CO2 22 - 32 mmol/L 30  29  29    Calcium  8.9 - 10.3 mg/dL 9.3  9.7  9.4     No results found.  ASSESSMENT/PLAN:  Assessment: Principal Problem:   COPD with acute exacerbation (HCC) Active Problems:   Pneumonia due to infectious organism  Hjalmer Iovino. is a 63 y.o. with a pertinent PMH of end stage COPD on 10L O2, pulmonary nodules under surveillance, gout, CKD, HLD, HTN , who presented with worsening shortness of breath and admitted for COPD exacerbation secondary to community acquired pneumonia, who presented with worsening shortness of breath and admitted for COPD exacerbation secondary to community acquired pneumonia.   Plan: #Acute on chronic hypoxic respiratory failure #COPD exacerbation secondary to community acquired pneumonia #Leukocytosis  Home O2 requirements of 10L with occasional increased O2 needs when ambulating to the restroom to 15L. Worsening shortness of breath and malaise over two days prior to admission without changes in his cough in the setting of a sick contact. Home medications are tiotropium and Symbicort . Afebrile,  hypoxic, tachypneic, and tachycardic in the ED. Leukocytosis to 13.6 with unclear baseline. Troponins and BNP within normal limits. RVP negative. CXR showed increasing bibasilar opacities, right greater than left, with possible pneumonia in the right lung base. He received Ceftriaxone , Azithromycin , Solumedrol, Duonebs, and Magnesium  in the ED. He was initially placed on BiPAP in the ED with improvement of his symptoms. Since his RVP was negative and he has infiltrates on his x-ray, we will treat for CAP. His WBC increased after prednisone  administration to 20.9 on 10/13 and remained stable at 20.5 today. On, 10/13 he did ambulate more than the days prior, however he desaturated while on 15L. Pulmonology was consulted this admission, and recommended that he start prophylactic Azithromycin  and oral low dose prednisone . He has been trying to wean his resting O2 to 9L. He walked with mobility today and was able to maintain his saturations at 15L, although he did desaturate on 10L O2. The patient is at his baseline   -Anticipate discharge today -Pulmonology consulted, appreciate recommendations -Azithromycin  three times weekly to reduce exacerbation frequency -Start low-dose prednisone  10mg  on discharge to maintain stability -Virtual pulmonary rehab -Continue other home medications as below. -Start oximizer while at home, coordinating with case management to have this delivered. -Establish care with [facility] Palliative care -BIPAP at night -Ceftriaxone  1g for 5 days, last dose 10/15 -Finished course of Azithromycin  500mg  daily for 3 days, last dose 10/13 -Prednisone  40mg  for 4 days starting 10/11, last day 10/15 -Duonebs q6 PRN -Breo Ellipta  1 puff daily -Incruse Ellipta  daily -Roflumilast  500mcg daily -Montelukast  10mg  daily -CBC daily   #Microcytic anemia  Hgb 12.1 on admission, recent baseline 13-14. MCV 74.9. Asymptomatic currently with no reported symptoms of bleeding. Hgb 11.1 on 10/15    -Trend CBC   #Thrombocytosis Elevated to 556 on admission, 521 today. Likely acute phase reactant.   #Hypertension  Home medications Amlodipine  10mg  and Metoprolol  50mg  daily. BP on admission well controlled. Has not required Metoprolol  for adequate BP control in the hospital. -Continue home Amlodipine  10mg  -Holding home Metoprolol  due to unclear indication and contraindication with COPD, plan to stop at discharge   #Hyperlipidemia -Continue home Zetia  10mg  daily   #Gout -Continue home allopurinol  100mg  daily  Best Practice: Diet: Regular diet VTE: rivaroxaban  (XARELTO ) tablet 10 mg Start: 01/19/24 1545 Code: DNR/DNI  Disposition planning: DISPO: Anticipated discharge today to Home.  Signature:  Melvenia Napoleon [facility]  6:31 AM, 01/23/2024  On Call pager [phone]

## 2024-01-23 NOTE — TOC CM/SW Note (Signed)
    Durable Medical Equipment  (From admission, onward)           Start     Ordered   01/23/24 0955  For home use only DME Other see comment  Once       Comments: Per pulmonology, patient will benefit from an oximizer  Question:  Length of Need  Answer:  Lifetime   01/23/24 0955   01/21/24 1558  For home use only DME 3 n 1  Once       Comments: patient is confined to one room at home   01/21/24 1557

## 2024-01-23 NOTE — TOC Transition Note (Signed)
 Transition of Care [facility]) - Discharge Note   Patient Details  Name: [name] [name]. MRN: [id number] Date of Birth: 08/10/1960  Transition of Care [facility]) CM/SW Contact:  Sudie Erminio Deems, RN Phone Number: 01/23/2024, 10:15 AM   Clinical Narrative:  Inpatient Case Manager received notification that patient will need an Oxymizer for home. Previous DME ordered via [employer] and new referral submitted for Oxymizer. [employer] to deliver to the room before discharge. Patient is calling daughter to coordinate transportation home. Patient states daughter will have portable tanks in the car. No further needs identified at this time.     Final next level of care: Home/Self Care Barriers to Discharge: No Barriers Identified  Patient Goals and CMS Choice Patient states their goals for this hospitalization and ongoing recovery are:: Plans to return home with outpatient palliative services.    Discharge Plan and Services Additional resources added to the After Visit Summary for     Discharge Planning Services: CM Consult Post Acute Care Choice: NA          DME Arranged: Bedside commode DME Agency: [employer] Date DME Agency Contacted: 01/21/24 Time DME Agency Contacted: 1502 Representative spoke with at DME Agency: [facility] HH Arranged: NA         Social Drivers of Health (SDOH) Interventions SDOH Screenings   Food Insecurity: No Food Insecurity (01/19/2024)  Housing: Low Risk  (01/19/2024)  Transportation Needs: Unmet Transportation Needs (01/19/2024)  Utilities: Not At Risk (01/19/2024)  Depression (PHQ2-9): Low Risk  (12/20/2023)  Social Connections: Moderately Isolated (01/19/2024)  Tobacco Use: Medium Risk (01/19/2024)     Readmission Risk Interventions    12/18/2023   12:11 PM 11/02/2023   10:59 AM 09/14/2023    1:09 PM  Readmission Risk Prevention Plan  Post Dischage Appt Complete  Complete  Medication Screening Complete  Complete  Transportation Screening Complete Complete  Complete  PCP or Specialist Appt within 5-7 Days  Complete   Home Care Screening  Complete   Medication Review (RN CM)  Complete

## 2024-01-23 NOTE — Plan of Care (Signed)

## 2024-01-24 ENCOUNTER — Other Ambulatory Visit

## 2024-01-24 ENCOUNTER — Telehealth: Payer: Self-pay

## 2024-01-24 ENCOUNTER — Ambulatory Visit: Payer: Self-pay

## 2024-01-24 ENCOUNTER — Other Ambulatory Visit: Payer: Self-pay

## 2024-01-24 NOTE — Telephone Encounter (Signed)
 Reason for Disposition  [1] Caller requesting NON-URGENT health information AND [2] PCP's office is the best resource  Answer Assessment - Initial Assessment Questions 1. REASON FOR CALL: What is the main reason for your call? or How can I best help you? Pt had questions about optimizer on oxygen  tank. RN answered pts question on where it goes.   Pt was wondering if he should use it on portable tank or home tank. RN advised to use it for both tanks. He states he was only given one and wants to know how often to change it and if he can have a second one as it's too much work to switch it back and forth.  Protocols used: Information Only Call - No Triage-A-AH

## 2024-01-24 NOTE — Transitions of Care (Post Inpatient/ED Visit) (Signed)
   01/24/2024  Name: [name] [name]. MRN: [id number] DOB: 1960/10/02  Today's TOC FU Call Status: Today's TOC FU Call Status:: Successful TOC FU Call Completed TOC FU Call Complete Date: 01/24/24 Patient's Name and Date of Birth confirmed.  Transition Care Management Follow-up Telephone Call Date of Discharge: 01/23/24 Discharge Facility: [facility]) Type of Discharge: Inpatient Admission Primary Inpatient Discharge Diagnosis:: COPD with acute exacerbation How have you been since you were released from the hospital?: Better Any questions or concerns?: No  Items Reviewed: Did you receive and understand the discharge instructions provided?: Yes Medications obtained,verified, and reconciled?: Yes (Medications Reviewed) Any new allergies since your discharge?: No Do you have support at home?: Yes People in Home [RPT]: child(ren), adult Name of Support/Comfort Primary Source: patient has 2 daughters assisting and states 4 grandchildren in the home from age 73-20 something  Medications Reviewed Today: Unable to complete - patient declined enrollment Medications Reviewed Today   Medications were not reviewed in this encounter     Home Care and Equipment/Supplies: Were Home Health Services Ordered?: No Any new equipment or medical supplies ordered?: Yes Name of Medical supply agency?: [employer] Were you able to get the equipment/medical supplies?: Yes Do you have any questions related to the use of the equipment/supplies?: No  Functional Questionnaire: Do you need assistance with bathing/showering or dressing?: No (patient states he washes off instead of getting a shower) Do you need assistance with meal preparation?: Yes (daughters prepare meals) Do you need assistance with eating?: No  Shona Prow RN, CCM [facility]  VBCI-Population Health RN Care Manager [phone]

## 2024-01-24 NOTE — Telephone Encounter (Addendum)
 FYI Only or Action Required?: Action required by provider: clinical question for provider.  Patient is followed in Pulmonology for copd, last seen on 06/13/2023 by Malachy Comer GAILS, NP.  Called Nurse Triage reporting Advice Only.  Symptoms began today.  Interventions attempted: Nothing.  Symptoms are: unchanged.  Triage Disposition: Call PCP When Office is Open  Patient/caregiver understands and will follow disposition?: Yes  Pt called back after this and spoke to RN again and his O2 dropped in the 70's on the oxymizer. He put his regular tubing back on and said it will continue to us  that until he hears back from pulm. Rn stated understanding and advised pt to go to the ER if oxygen  drops again. Pt stated understanding.

## 2024-01-25 ENCOUNTER — Other Ambulatory Visit: Payer: Self-pay

## 2024-01-25 ENCOUNTER — Other Ambulatory Visit: Payer: Self-pay | Admitting: Pulmonary Disease

## 2024-01-25 NOTE — Telephone Encounter (Signed)
 I called and spoke with the pt  He reports his sats are 94% 10 lpm and I feel great  I advised he is overdue for appt and tried to get him scheduled for Monday 10/20  He states he will need to call back to schedule appt bc it depends on when he can get transportation  I advised if his sats drop below 90 again to seek emergent care and he verbalized understanding

## 2024-01-28 ENCOUNTER — Other Ambulatory Visit: Payer: Self-pay | Admitting: Pulmonary Disease

## 2024-01-28 ENCOUNTER — Other Ambulatory Visit (HOSPITAL_BASED_OUTPATIENT_CLINIC_OR_DEPARTMENT_OTHER): Payer: Self-pay

## 2024-01-28 ENCOUNTER — Other Ambulatory Visit: Payer: Self-pay

## 2024-01-28 ENCOUNTER — Telehealth: Payer: Self-pay | Admitting: *Deleted

## 2024-01-28 MED ORDER — ROFLUMILAST 500 MCG PO TABS
500.0000 ug | ORAL_TABLET | Freq: Every day | ORAL | 3 refills | Status: AC
  Filled 2024-01-28: qty 30, 30d supply, fill #0
  Filled 2024-03-03: qty 30, 30d supply, fill #1
  Filled 2024-03-24: qty 30, 30d supply, fill #2
  Filled 2024-04-30 – 2024-05-01 (×2): qty 30, 30d supply, fill #3

## 2024-01-28 NOTE — Telephone Encounter (Signed)
 Copied from CRM #[id number]. Topic: Clinical - Medical Advice >> Jan 24, 2024  2:40 PM Rilla B wrote: Reason for CRM: Patient was discharge with an optimizer and needs help understanding when or how to use it. Please call patient [phone].  This has been addressed.  Nothing further needed.

## 2024-01-28 NOTE — Progress Notes (Signed)
 Complex Care Management Note  Care Guide Note 01/28/2024 Name: [name] [name]. MRN: [id number] DOB: [birth year]  [name] [name]. is a 63 y.o. year old male who sees Delbert Clam, MD for primary care. I reached out to [name] [name]. by phone today to offer complex care management services.  [name] was given information about Complex Care Management services today including:   The Complex Care Management services include support from the care team which includes your Nurse Care Manager, Clinical Social Worker, or Pharmacist.  The Complex Care Management team is here to help remove barriers to the health concerns and goals most important to you. Complex Care Management services are voluntary, and the patient may decline or stop services at any time by request to their care team member.   Complex Care Management Consent Status: Patient agreed to services and verbal consent obtained.   Follow up plan:  Telephone appointment with complex care management team member scheduled for:  02/08/24  Encounter Outcome:  Patient Scheduled [name] [name]  [facility]  [facility], [facility] Guide  Direct Dial: [phone]  Fax [phone]

## 2024-01-29 ENCOUNTER — Telehealth: Payer: Self-pay

## 2024-01-29 ENCOUNTER — Other Ambulatory Visit (HOSPITAL_COMMUNITY): Payer: Self-pay

## 2024-01-29 NOTE — Telephone Encounter (Signed)
*  Pulm  Pharmacy Patient Advocate Encounter   Received notification from CoverMyMeds that prior authorization for Roflumilast  tablets  is required/requested.   Insurance verification completed.   The patient is insured through [facility].   Per test claim:  Brand Daliresp  is preferred by the insurance.  If suggested medication is appropriate, Please send in a new RX and discontinue this one. If not, please advise as to why it's not appropriate so that we may request a Prior Authorization. Please note, some preferred medications may still require a PA.  If the suggested medications have not been trialed and there are no contraindications to their use, the PA will not be submitted, as it will not be approved.

## 2024-01-29 NOTE — Telephone Encounter (Signed)
 Patient is informed,medication was sent in yesterday.NFN

## 2024-01-30 ENCOUNTER — Other Ambulatory Visit: Payer: Self-pay

## 2024-01-30 ENCOUNTER — Telehealth: Payer: Self-pay

## 2024-01-30 NOTE — Telephone Encounter (Signed)
 Copied from CRM [id number]. Topic: Clinical - Prescription Issue >> Jan 30, 2024  2:37 PM Essie A wrote: Reason for CRM: Patient is calling in regards to his medication roflumilast  (DALIRESP ) 500 MCG TABS tablet.  He went to the pharmacy who said they didn't receive the request and charged him $15.66 instead of the usual $4.00 that  he pays.  He just want someone to fix this so he won't have to keep paying the higher cost.  Please let him know when the pharmacy has been contacted at [phone].

## 2024-01-30 NOTE — Telephone Encounter (Signed)
 Patient was called and informed that he needs to reach out to his Lung doctor for medication cost.

## 2024-01-31 ENCOUNTER — Other Ambulatory Visit: Payer: Self-pay | Admitting: Family Medicine

## 2024-01-31 ENCOUNTER — Other Ambulatory Visit: Payer: Self-pay

## 2024-02-01 ENCOUNTER — Other Ambulatory Visit (HOSPITAL_COMMUNITY)

## 2024-02-01 NOTE — Telephone Encounter (Signed)
 Requested medications are due for refill today.  unsure  Requested medications are on the active medications list.  yes  Last refill. 10/15  Future visit scheduled.   yes  Notes to clinic.  Refill on 10/15 was signed by another provider and was printed. Refill not delegated.    Requested Prescriptions  Pending Prescriptions Disp Refills   tiZANidine  (ZANAFLEX ) 4 MG tablet 60 tablet 0    Sig: Take 1 tablet (4 mg total) by mouth every 8 (eight) hours as needed.     Not Delegated - Cardiovascular:  Alpha-2 Agonists - tizanidine  Failed - 02/01/2024  5:21 PM      Failed - This refill cannot be delegated      Passed - Valid encounter within last 6 months    Recent Outpatient Visits           1 month ago Leukocytosis, unspecified type   [place] [facility] Of [place]. [facility] Delbert Clam, MD   3 months ago COPD without exacerbation [facility])   [place] [facility] [place] [facility] Dept Of [place]. [facility] Delbert Clam, MD   6 months ago Hospital discharge follow-up   [facility] [place] - A Dept Of [place]. [facility] [place], Jon HERO, [place]   1 year ago Hospital discharge follow-up   [facility] Celestia Rosaline SQUIBB, NP   1 year ago Screening for diabetes mellitus   [place] [facility] - A Dept Of . [facility] Delbert Clam, MD

## 2024-02-04 ENCOUNTER — Other Ambulatory Visit: Payer: Self-pay | Admitting: Family Medicine

## 2024-02-04 ENCOUNTER — Ambulatory Visit: Payer: Self-pay | Admitting: *Deleted

## 2024-02-04 ENCOUNTER — Other Ambulatory Visit: Payer: Self-pay

## 2024-02-04 NOTE — Telephone Encounter (Signed)
 Requested medication (s) are due for refill today: yes  Requested medication (s) are on the active medication list: yes  Last refill:  01/23/24  Future visit scheduled: yes  Notes to clinic:  Unable to refill per protocol, cannot delegate.      Requested Prescriptions  Pending Prescriptions Disp Refills   tiZANidine  (ZANAFLEX ) 4 MG tablet 60 tablet 0    Sig: Take 1 tablet (4 mg total) by mouth every 8 (eight) hours as needed.     Not Delegated - Cardiovascular:  Alpha-2 Agonists - tizanidine  Failed - 02/04/2024  2:38 PM      Failed - This refill cannot be delegated      Passed - Valid encounter within last 6 months    Recent Outpatient Visits           1 month ago Leukocytosis, unspecified type   [place] [facility] Dept Of [place]. [facility] Delbert Clam, MD   3 months ago COPD without exacerbation [facility])   [place] [facility] [place] [facility] Dept Of [place]. [facility] Delbert Clam, MD   6 months ago Hospital discharge follow-up   [facility] [place] [facility] A Dept Of [place]. [facility] [place], [place] HERO, [place]   1 year ago Hospital discharge follow-up   [place] [facility] Celestia Rosaline SQUIBB, NP   1 year ago Screening for diabetes mellitus   [place] [facility] - A Dept Of [place]. [facility] Delbert Clam, MD

## 2024-02-04 NOTE — Telephone Encounter (Signed)
 Patient requesting refill on Zanaflex  4 mg

## 2024-02-04 NOTE — Telephone Encounter (Signed)
 Please advise. Patient requesting call back . See NT encounter Declined appt or mobile bus    FYI Only or Action Required?: Action required by provider: request for appointment and requesting telephone visit and medication zanaflex .  Patient was last seen in primary care on 12/26/2023 by Newlin, Enobong, MD.  Called Nurse Triage reporting Medication Problem.  Symptoms began several days ago.  Interventions attempted: Rest, hydration, or home remedies and Other: mustard.  Symptoms are: gradually worsening.  Triage Disposition: See PCP When Office is Open (Within 3 Days)  Patient/caregiver understands and will follow disposition?: No, wishes to speak with PCP     Copied from CRM #[id number]. Topic: Clinical - Red Word Triage >> Feb 04, 2024  2:16 PM Wess RAMAN wrote: Red Word that prompted transfer to Nurse Triage: Patient states he is on medication to keep him from cramping. He is cramping really bad in hands and feet. Pharmacy stated they have not heard from Dr. Newlin and need her to fax over the prescription in order for patient to pickup.  Medication: tiZANidine  (ZANAFLEX ) 4 MG tablet Reason for Disposition  [1] MODERATE pain (e.g., interferes with normal activities) AND [2] present > 3 days  Answer Assessment - Initial Assessment Questions Patient requesting if PCP will order Zanaflex  4 mg as ordered from recent hospital stay, d/c 01/23/24.  Rx Last ordered by N. Smucker, MD and patient reports he has finished Rx. Reports cramping in hands and feet worsening since not taking zanaflex . Recommended OV. No available appt until Dec. Recommended Mobile unit and patient reports he has difficulty going out due to wearing O2 at 10 L/min. No transportation and reports he has had covid and was in hospital. Patient has difficulty getting in to my chart and requesting if PCP will consider telephone conversation regarding sx. Patient requesting call back. Patient has appt with PCP 03/26/24. Please  advise . Patient reports he has been trying 'mustard and does not know how much more he can take.        1. ONSET: When did the pain start?     2 days ago  2. LOCATION: Where is the pain located?     Bilateral hands and feet  3. PAIN: How bad is the pain? (Scale 1-10; or mild, moderate, severe)     Worsening . Now out of zanaflex  4. WORK OR EXERCISE: Has there been any recent work or exercise that involved this part (i.e., hand or wrist) of the body?     Na  5. CAUSE: What do you think is causing the pain?     Out of medication zanaflex  6. AGGRAVATING FACTORS: What makes the pain worse? (e.g., using computer)     Not taking zanaflex  7. OTHER SYMPTOMS: Do you have any other symptoms? (e.g., fever, neck pain, numbness or tingling, rash, swelling)     Cramping in hands and feet. Uses oxygen  due to s/p covid  8. PREGNANCY: Is there any chance you are pregnant? When was your last menstrual period?     na  Protocols used: Hand Pain-A-AH

## 2024-02-05 ENCOUNTER — Other Ambulatory Visit: Payer: Self-pay

## 2024-02-05 MED ORDER — TIZANIDINE HCL 4 MG PO TABS
4.0000 mg | ORAL_TABLET | Freq: Three times a day (TID) | ORAL | 2 refills | Status: AC | PRN
  Filled 2024-02-05: qty 60, 20d supply, fill #0

## 2024-02-05 NOTE — Telephone Encounter (Signed)
 Done

## 2024-02-05 NOTE — Telephone Encounter (Signed)
 Patient has been informed.

## 2024-02-06 ENCOUNTER — Other Ambulatory Visit: Payer: Self-pay

## 2024-02-07 ENCOUNTER — Other Ambulatory Visit: Payer: Self-pay

## 2024-02-08 ENCOUNTER — Other Ambulatory Visit: Payer: Self-pay

## 2024-02-08 ENCOUNTER — Other Ambulatory Visit: Payer: Self-pay | Admitting: *Deleted

## 2024-02-08 NOTE — Patient Instructions (Signed)
 Visit Information  Thank you for taking time to visit with me today. Please don't hesitate to contact me if I can be of assistance to you before our next scheduled appointment.  Your next care management appointment is by telephone on 03/10/24  at 2 pm  Please call the care guide team at [phone] if you need to cancel, schedule, or reschedule an appointment.   Please call the Suicide and Crisis Lifeline: 988 call the [place]  National Suicide Prevention Lifeline: 727-475-2498 or TTY: 984-640-1354 TTY 408-321-2486) to talk to a trained counselor call 1-800-273-TALK (toll free, 24 hour hotline) go to [facility] [address], [place] [phone]) call the [facility] Crisis Line: [phone] call 911 if you are experiencing a Mental Health or Behavioral Health Crisis or need someone to talk to.  Yetta Marceaux, RN, BSN, Theatre Manager [employer] [phone]

## 2024-02-08 NOTE — Patient Outreach (Signed)
 Complex Care Management   Visit Note  02/08/2024  Name:  [name] [name]. MRN: [id number] DOB: 06-Sep-1960  Situation: Referral received for Complex Care Management related to COPD I obtained verbal consent from Patient.  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   COPD (chronic obstructive pulmonary disease) (HCC)    on 4L home O2   Essential hypertension    Gout    Thrombocytopenia 07/28/2012    Assessment: Patient Reported Symptoms:  Cognitive Cognitive Status: No symptoms reported, Able to follow simple commands, Alert and oriented to person, place, and time, Insightful and able to interpret abstract concepts, Normal speech and language skills Cognitive/Intellectual Conditions Management [RPT]: None reported or documented in medical history or problem list   Health Maintenance Behaviors: Annual physical exam, Sleep adequate, Stress management Healing Pattern: Average Health Facilitated by: Healthy diet, Rest, Stress management, Prayer/meditation  Neurological Neurological Review of Symptoms: No symptoms reported Neurological Management Strategies: Adequate rest, Coping strategies, Routine screening Neurological Self-Management Outcome: 4 (good)  HEENT HEENT Symptoms Reported: No symptoms reported HEENT Management Strategies: Adequate rest, Routine screening HEENT Self-Management Outcome: 4 (good)    Cardiovascular Cardiovascular Symptoms Reported: Swelling in legs or feet Does patient have uncontrolled Hypertension?: No Cardiovascular Management Strategies: Adequate rest, Routine screening Cardiovascular Self-Management Outcome: 4 (good) Cardiovascular Comment: Reports occassional swelling in lower extremities  Respiratory Respiratory Symptoms Reported: Shortness of breath Other Respiratory Symptoms: Shortness of breath at intervals.  Patient wears  02 at 10L per [place].  Patient reports that he also uses Albuterol  nebulizer and inhalers as needed for shortness of breath.  Patient reports that he wears Bipap at night. Patient reports that pulse ox reading ranges from 92%-95%. Respiratory Management Strategies: BiPAP, Adequate rest, Oxygen  therapy Respiratory Self-Management Outcome: 3 (uncertain)  Endocrine Endocrine Symptoms Reported: No symptoms reported Is patient diabetic?: No Endocrine Self-Management Outcome: 4 (good)  Gastrointestinal Gastrointestinal Symptoms Reported: Diarrhea Additional Gastrointestinal Details: Patient reports that he has had occassional diarrhea.  He reports that he takes Immodium as needed. Gastrointestinal Management Strategies: Adequate rest, Medication therapy Gastrointestinal Self-Management Outcome: 3 (uncertain)    Genitourinary Genitourinary Symptoms Reported: No symptoms reported Genitourinary Management Strategies: Adequate rest Genitourinary Self-Management Outcome: 4 (good)  Integumentary Integumentary Symptoms Reported: No symptoms reported Skin Management Strategies: Adequate rest, Routine screening Skin Self-Management Outcome: 4 (good)  Musculoskeletal Musculoskelatal Symptoms Reviewed: Limited mobility Additional Musculoskeletal Details: Patient reports that he ambulates with a walker Musculoskeletal Management Strategies: Adequate rest, Coping strategies, Routine screening Musculoskeletal Self-Management Outcome: 3 (uncertain) Falls in the past year?: No Number of falls in past year: 1 or less Was there an injury with Fall?: No Fall Risk Category Calculator: 0 Patient Fall Risk Level: Low Fall Risk Patient at Risk for Falls Due to: No Fall Risks Fall risk Follow up: Falls evaluation completed  Psychosocial Psychosocial Symptoms Reported: No symptoms reported     Quality of Family Relationships: helpful, involved, supportive Do you feel physically threatened by others?: No     02/08/2024    PHQ2-9 Depression Screening   Little interest or pleasure in doing things Not at all  Feeling down, depressed, or hopeless Not at all  PHQ-2 - Total Score 0  Trouble falling or staying asleep, or sleeping too much    Feeling tired or having little energy    Poor appetite or overeating  Feeling bad about yourself - or that you are a failure or have let yourself or your family down    Trouble concentrating on things, such as reading the newspaper or watching television    Moving or speaking so slowly that other people could have noticed.  Or the opposite - being so fidgety or restless that you have been moving around a lot more than usual    Thoughts that you would be better off dead, or hurting yourself in some way    PHQ2-9 Total Score    If you checked off any problems, how difficult have these problems made it for you to do your work, take care of things at home, or get along with other people    Depression Interventions/Treatment      There were no vitals filed for this visit.  Medications Reviewed Today     Reviewed by Jorja Nichole LABOR, RN (Case Manager) on 02/08/24 at 1320  Med List Status: <None>   Medication Order Taking? Sig Documenting Provider Last Dose Status Informant  albuterol  (PROVENTIL ) (2.5 MG/3ML) 0.083% nebulizer solution [id number] Yes TAKE 3 MLS (2.5 MG TOTAL) BY NEBULIZATION EVERY 6 (SIX) HOURS AS NEEDED FOR SHORTNESS OF BREATH. Newlin, Enobong, MD  Active Self, Pharmacy Records  albuterol  (VENTOLIN  HFA) [phone] Base) MCG/ACT inhaler [id number] Yes Inhale 2 puffs into the lungs every 6 (six) hours as needed for wheezing or shortness of breath Delbert Clam, MD  Active Self, Pharmacy Records    Discontinued 07/03/11 1455 (Entry Error)   allopurinol  (ZYLOPRIM ) 100 MG tablet [id number] Yes Take 1 tablet (100 mg total) by mouth daily. Newlin, Enobong, MD  Active Self, Pharmacy Records  amLODipine  (NORVASC ) 10 MG tablet [id number] Yes Take 1 tablet  (10 mg total) by mouth daily. Newlin, Enobong, MD  Active Self, Pharmacy Records  azithromycin  (ZITHROMAX ) 500 MG tablet [id number] Yes Take 1 tablet (500 mg total) by mouth 3 (three) times a week. Take 1 tablet (500 mg total) by mouth 3 (three) times a week. (Monday, Wednesday, Friday) Smucker, Melvenia, MD  Active   budesonide -formoterol  (SYMBICORT ) 160-4.5 MCG/ACT inhaler [id number] Yes Inhale 2 puffs into the lungs 2 (two) times daily. Mannam, Praveen, MD  Active   cyanocobalamin  (VITAMIN B12) 1000 MCG tablet [id number] Yes Take 1,000 mcg by mouth daily. [provider]  Active Self, Pharmacy Records  ezetimibe  (ZETIA ) 10 MG tablet [id number] Yes Take 1 tablet (10 mg total) by mouth daily. Newlin, Enobong, MD  Active Self, Pharmacy Records  loperamide  (IMODIUM ) 2 MG capsule [id number] Yes Take 1 capsule (2 mg total) by mouth as needed for diarrhea or loose stools. Smucker, Melvenia, MD  Active   montelukast  (SINGULAIR ) 10 MG tablet [id number] Yes Take 1 tablet (10 mg total) by mouth at bedtime. Newlin, Enobong, MD  Active Self, Pharmacy Records  OXYGEN  [id number] Yes Inhale 10 L into the lungs continuous. [provider]  Active Self, Pharmacy Records           Med Note STEFFI NIAN   Mon Jan 21, 2024  9:53 AM) If this is via nasal cannula, the range is typically from 1-6 lpm and not 10 lpm as the patient is taking. Patient would need a high flow nasal cannula or another method for the patient to use 10 lpm.   predniSONE  (DELTASONE ) 10 MG tablet [id number] Yes Take 1 tablet (10 mg total) by mouth daily with breakfast. Napoleon Melvenia, MD  Active   roflumilast  (DALIRESP ) 500 MCG TABS tablet [id number]  Yes Take 1 tablet (500 mcg total) by mouth daily. Mannam, Praveen, MD  Active   tiotropium (SPIRIVA  HANDIHALER) 18 MCG inhalation capsule [id number] Yes PLACE 1 CAPSULE (18 MCG TOTAL) INTO INHALER AND INHALE DAILY. Newlin, Enobong, MD  Active Self, Pharmacy Records  Tiotropium  Bromide (SPIRIVA  HANDIHALER) 18 MCG CAPS [id number] Yes Place 1 capsule into inhaler and inhale daily. Newlin, Enobong, MD  Active   tiZANidine  (ZANAFLEX ) 4 MG tablet [id number] Yes Take 1 tablet (4 mg total) by mouth every 8 (eight) hours as needed for muscle spasms. Newlin, Enobong, MD  Active   Vitamin D , Ergocalciferol , (DRISDOL ) 1.25 MG (50000 UNIT) CAPS capsule [id number] Yes Take 1 capsule (50,000 Units total) by mouth every 7 (seven) days. Newlin, Enobong, MD  Active Self, Pharmacy Records  Med List Note Steffi Nian, [place] 01/21/24 9048): Bipap at bedtime             Recommendation:   PCP Follow-up Specialty provider follow-up : Pulmonology-02/21/24 Continue Current Plan of Care  Follow Up Plan:   Telephone follow-up in 1 month: 03/10/24 @ 2 pm.   Rossie Bretado, RN, BSN, ACM RN Care Manager [facility] [phone]

## 2024-02-09 DIAGNOSIS — J449 Chronic obstructive pulmonary disease, unspecified: Secondary | ICD-10-CM | POA: Diagnosis not present

## 2024-02-11 ENCOUNTER — Ambulatory Visit: Payer: Self-pay | Admitting: Pulmonary Disease

## 2024-02-11 NOTE — Telephone Encounter (Signed)
 Pt last seen in March, following up with PCP about concerns.

## 2024-02-11 NOTE — Telephone Encounter (Signed)
 FYI Only or Action Required?: FYI only for provider: refused appointment, advise only call.  Patient is followed in Pulmonology for 11/09/2023, last seen on 06/13/2023 by Malachy Comer GAILS, NP.  Called Nurse Triage reporting Heartburn.  Symptoms began yesterday.  Interventions attempted: Nothing.  Symptoms are: unchanged.  Triage Disposition: See PCP Within 2 Weeks  Patient/caregiver understands and will follow disposition?: No   Copied from CRM [id number]. Topic: Clinical - Red Word Triage >> Feb 11, 2024 10:07 AM Avram MATSU wrote: Red Word that prompted transfer to Nurse Triage: heart burn, has tightness >> Feb 11, 2024 10:18 AM Leila BROCKS wrote: Patient [phone] states had heartburn, gas, tightness in chest since yesterday and is asking Dr. Theophilus what to take. Patient states does not take any OTC medications without having provider permission. Patient denies shortness of breath, wheezing, nor pain. Please advise.  Reason for Disposition  [1] Abdominal pain is intermittent AND [2] shoots into chest, with sour taste in mouth  (Exception: Symptoms same as previously diagnosed reflux and not tried antacids.)  Answer Assessment - Initial Assessment Questions Additional info: 1) Asking recommendation for antacids.-Advised as per care advise.  2) Patient states he called PCP first but was taking to long to answer so calling pulmonology for advise. Denies copd exacerbation.  3) Declines need for appointment at this time and will follow up with pcp, offered pcp triage but he declined at this time.    1. LOCATION: Where does it hurt?      No pain having heartburn  2. RADIATION: Does the pain shoot anywhere else? (e.g., chest, back)     denies 3. ONSET: When did the pain begin? (e.g., minutes, hours or days ago)      Yesterday  4. SUDDEN: Gradual or sudden onset?     gradual 5. PATTERN Does the pain come and go, or is it constant?     Intermittent-belches and goes away  6.  SEVERITY: How bad is the pain?  (e.g., Scale 1-10; mild, moderate, or severe)     Mild  7. RECURRENT SYMPTOM: Have you ever had this type of stomach pain before? If Yes, ask: When was the last time? and What happened that time?      No  8. AGGRAVATING FACTORS: Does anything seem to cause this pain? (e.g., foods, stress, alcohol)     no 9. CARDIAC SYMPTOMS: Do you have any of the following symptoms: chest pain, difficulty breathing, sweating, nausea?     Denies  10. OTHER SYMPTOMS: Do you have any other symptoms? (e.g., back pain, diarrhea, fever, urination pain, vomiting)       Denies. No bm x 2 days wondering if he should take laxative.  Protocols used: Abdominal Pain - Upper-A-AH

## 2024-02-12 ENCOUNTER — Telehealth: Payer: Self-pay | Admitting: Family Medicine

## 2024-02-12 NOTE — Telephone Encounter (Signed)
 Copied from CRM [id number]. Topic: Clinical - Red Word Triage >> Feb 11, 2024 10:07 AM Avram MATSU wrote: Red Word that prompted transfer to Nurse Triage: heart burn, has tightness >> Feb 12, 2024  9:52 AM Zebedee SAUNDERS wrote: Pt needs  a call back at [phone] and something sent to the pharmacy for his heart burn. Please call pt and sent medication to  [facility]  [address] [place] [address]  Phone: [phone] Fax: [phone]   >> Feb 11, 2024 10:18 AM Leila BROCKS wrote: Patient [phone] states had heartburn, gas, tightness in chest since yesterday and is asking Dr. Theophilus what to take. Patient states does not take any OTC medications without having provider permission. Patient denies shortness of breath, wheezing, nor pain. Please advise.

## 2024-02-13 ENCOUNTER — Emergency Department (HOSPITAL_COMMUNITY)

## 2024-02-13 ENCOUNTER — Observation Stay (HOSPITAL_COMMUNITY)

## 2024-02-13 ENCOUNTER — Encounter (HOSPITAL_COMMUNITY): Payer: Self-pay

## 2024-02-13 ENCOUNTER — Other Ambulatory Visit: Payer: Self-pay

## 2024-02-13 ENCOUNTER — Inpatient Hospital Stay (HOSPITAL_COMMUNITY)
Admission: EM | Admit: 2024-02-13 | Discharge: 2024-02-17 | DRG: 194 | Disposition: A | Discharge status: Home / Self Care | Attending: Internal Medicine | Admitting: Internal Medicine

## 2024-02-13 DIAGNOSIS — Z7951 Long term (current) use of inhaled steroids: Secondary | ICD-10-CM | POA: Diagnosis not present

## 2024-02-13 DIAGNOSIS — Z7952 Long term (current) use of systemic steroids: Secondary | ICD-10-CM | POA: Diagnosis not present

## 2024-02-13 DIAGNOSIS — J9621 Acute and chronic respiratory failure with hypoxia: Secondary | ICD-10-CM | POA: Diagnosis present

## 2024-02-13 DIAGNOSIS — D631 Anemia in chronic kidney disease: Secondary | ICD-10-CM | POA: Diagnosis not present

## 2024-02-13 DIAGNOSIS — Z888 Allergy status to other drugs, medicaments and biological substances status: Secondary | ICD-10-CM | POA: Diagnosis not present

## 2024-02-13 DIAGNOSIS — E785 Hyperlipidemia, unspecified: Secondary | ICD-10-CM | POA: Diagnosis present

## 2024-02-13 DIAGNOSIS — Z91013 Allergy to seafood: Secondary | ICD-10-CM | POA: Diagnosis not present

## 2024-02-13 DIAGNOSIS — J441 Chronic obstructive pulmonary disease with (acute) exacerbation: Secondary | ICD-10-CM | POA: Diagnosis not present

## 2024-02-13 DIAGNOSIS — Z9981 Dependence on supplemental oxygen: Secondary | ICD-10-CM

## 2024-02-13 DIAGNOSIS — Z1152 Encounter for screening for COVID-19: Secondary | ICD-10-CM

## 2024-02-13 DIAGNOSIS — Z66 Do not resuscitate: Secondary | ICD-10-CM | POA: Diagnosis present

## 2024-02-13 DIAGNOSIS — I129 Hypertensive chronic kidney disease with stage 1 through stage 4 chronic kidney disease, or unspecified chronic kidney disease: Secondary | ICD-10-CM | POA: Diagnosis present

## 2024-02-13 DIAGNOSIS — M109 Gout, unspecified: Secondary | ICD-10-CM | POA: Diagnosis present

## 2024-02-13 DIAGNOSIS — Z8249 Family history of ischemic heart disease and other diseases of the circulatory system: Secondary | ICD-10-CM | POA: Diagnosis not present

## 2024-02-13 DIAGNOSIS — D509 Iron deficiency anemia, unspecified: Secondary | ICD-10-CM | POA: Diagnosis present

## 2024-02-13 DIAGNOSIS — Z79899 Other long term (current) drug therapy: Secondary | ICD-10-CM | POA: Diagnosis not present

## 2024-02-13 DIAGNOSIS — J449 Chronic obstructive pulmonary disease, unspecified: Secondary | ICD-10-CM | POA: Diagnosis present

## 2024-02-13 DIAGNOSIS — Z91014 Allergy to mammalian meats: Secondary | ICD-10-CM

## 2024-02-13 DIAGNOSIS — Z87891 Personal history of nicotine dependence: Secondary | ICD-10-CM | POA: Diagnosis not present

## 2024-02-13 DIAGNOSIS — J440 Chronic obstructive pulmonary disease with acute lower respiratory infection: Secondary | ICD-10-CM | POA: Diagnosis present

## 2024-02-13 DIAGNOSIS — J189 Pneumonia, unspecified organism: Principal | ICD-10-CM | POA: Diagnosis present

## 2024-02-13 DIAGNOSIS — M1A9XX0 Chronic gout, unspecified, without tophus (tophi): Secondary | ICD-10-CM | POA: Diagnosis present

## 2024-02-13 DIAGNOSIS — J9611 Chronic respiratory failure with hypoxia: Secondary | ICD-10-CM | POA: Diagnosis present

## 2024-02-13 DIAGNOSIS — Z800 Family history of malignant neoplasm of digestive organs: Secondary | ICD-10-CM

## 2024-02-13 DIAGNOSIS — I10 Essential (primary) hypertension: Secondary | ICD-10-CM | POA: Diagnosis present

## 2024-02-13 DIAGNOSIS — Z792 Long term (current) use of antibiotics: Secondary | ICD-10-CM | POA: Diagnosis not present

## 2024-02-13 DIAGNOSIS — N182 Chronic kidney disease, stage 2 (mild): Secondary | ICD-10-CM | POA: Diagnosis present

## 2024-02-13 LAB — RESP PANEL BY RT-PCR (RSV, FLU A&B, COVID)  RVPGX2
Influenza A by PCR: NEGATIVE
Influenza B by PCR: NEGATIVE
Resp Syncytial Virus by PCR: NEGATIVE
SARS Coronavirus 2 by RT PCR: NEGATIVE

## 2024-02-13 LAB — RESPIRATORY PANEL BY PCR

## 2024-02-13 LAB — BASIC METABOLIC PANEL WITH GFR
Anion gap: 11 (ref 5–15)
BUN: 11 mg/dL (ref 8–23)
CO2: 29 mmol/L (ref 22–32)
Calcium: 9.9 mg/dL (ref 8.9–10.3)
Chloride: 95 mmol/L — ABNORMAL LOW (ref 98–111)
Creatinine, Ser: 0.96 mg/dL (ref 0.61–1.24)
GFR, Estimated: >60 mL/min (ref >60)
Glucose, Bld: 94 mg/dL (ref 70–99)
Potassium: 3.5 mmol/L (ref 3.5–5.1)
Sodium: 135 mmol/L (ref 135–145)

## 2024-02-13 LAB — CBC
HCT: 37.6 % — ABNORMAL LOW (ref 39.0–52.0)
Hemoglobin: 11.5 g/dL — ABNORMAL LOW (ref 13.0–17.0)
MCH: 22.9 pg — ABNORMAL LOW (ref 26.0–34.0)
MCHC: 30.6 g/dL (ref 30.0–36.0)
MCV: 74.9 fL — ABNORMAL LOW (ref 80.0–100.0)
Platelets: 226 K/uL (ref 150–400)
RBC: 5.02 MIL/uL (ref 4.22–5.81)
RDW: 17 % — ABNORMAL HIGH (ref 11.5–15.5)
WBC: 17.2 K/uL — ABNORMAL HIGH (ref 4.0–10.5)
nRBC: 0 % (ref 0.0–0.2)

## 2024-02-13 LAB — TROPONIN I (HIGH SENSITIVITY)
Troponin I (High Sensitivity): 9 ng/L (ref <18)
Troponin I (High Sensitivity): 7 ng/L (ref <18)

## 2024-02-13 LAB — MAGNESIUM: Magnesium: 1.9 mg/dL (ref 1.7–2.4)

## 2024-02-13 LAB — BRAIN NATRIURETIC PEPTIDE: B Natriuretic Peptide: 37.1 pg/mL (ref 0.0–100.0)

## 2024-02-13 LAB — MRSA NEXT GEN BY PCR, NASAL: MRSA by PCR Next Gen: NOT DETECTED

## 2024-02-13 MED ORDER — ACETAMINOPHEN 650 MG RE SUPP: 650.0000 mg | Freq: Four times a day (QID) | RECTAL | Status: DC | PRN

## 2024-02-13 MED ORDER — IPRATROPIUM-ALBUTEROL 0.5-2.5 (3) MG/3ML IN SOLN
3.0000 mL | Freq: Once | RESPIRATORY_TRACT | Status: AC
  Administered 2024-02-13 (×2): 3 mL via RESPIRATORY_TRACT
  Filled 2024-02-13: qty 3

## 2024-02-13 MED ORDER — IPRATROPIUM-ALBUTEROL 0.5-2.5 (3) MG/3ML IN SOLN
3.0000 mL | Freq: Four times a day (QID) | RESPIRATORY_TRACT | Status: AC
  Administered 2024-02-13 – 2024-02-14 (×4): 3 mL via RESPIRATORY_TRACT
  Filled 2024-02-13 (×4): qty 3

## 2024-02-13 MED ORDER — HYDROMORPHONE HCL 1 MG/ML IJ SOLN
0.5000 mg | Freq: Once | INTRAMUSCULAR | Status: AC
  Administered 2024-02-14: 0.5 mg via INTRAVENOUS
  Filled 2024-02-13: qty 1

## 2024-02-13 MED ORDER — IOHEXOL 350 MG/ML SOLN
75.0000 mL | Freq: Once | INTRAVENOUS | Status: AC | PRN
  Administered 2024-02-13: 75 mL via INTRAVENOUS

## 2024-02-13 MED ORDER — TIZANIDINE HCL 4 MG PO TABS
4.0000 mg | ORAL_TABLET | Freq: Three times a day (TID) | ORAL | Status: DC | PRN
Start: 2024-02-13 — End: 2024-02-17
  Administered 2024-02-13 – 2024-02-15 (×5): 4 mg via ORAL
  Filled 2024-02-13 (×5): qty 1

## 2024-02-13 MED ORDER — AZITHROMYCIN 250 MG PO TABS
500.0000 mg | ORAL_TABLET | Freq: Every day | ORAL | Status: DC
  Administered 2024-02-13 – 2024-02-14 (×2): 500 mg via ORAL
  Filled 2024-02-13 (×2): qty 2

## 2024-02-13 MED ORDER — AMLODIPINE BESYLATE 10 MG PO TABS
10.0000 mg | ORAL_TABLET | Freq: Every day | ORAL | Status: DC
  Administered 2024-02-13 – 2024-02-17 (×5): 10 mg via ORAL
  Filled 2024-02-13 (×4): qty 1
  Filled 2024-02-13: qty 2

## 2024-02-13 MED ORDER — IPRATROPIUM-ALBUTEROL 0.5-2.5 (3) MG/3ML IN SOLN
3.0000 mL | RESPIRATORY_TRACT | Status: DC
  Administered 2024-02-13 (×2): 3 mL via RESPIRATORY_TRACT
  Filled 2024-02-13 (×2): qty 3

## 2024-02-13 MED ORDER — MELATONIN 3 MG PO TABS
3.0000 mg | ORAL_TABLET | Freq: Every day | ORAL | Status: DC
  Administered 2024-02-13 – 2024-02-16 (×4): 3 mg via ORAL
  Filled 2024-02-13 (×4): qty 1

## 2024-02-13 MED ORDER — BUDESONIDE 0.25 MG/2ML IN SUSP
0.2500 mg | Freq: Two times a day (BID) | RESPIRATORY_TRACT | Status: DC
  Administered 2024-02-13 – 2024-02-14 (×3): 0.25 mg via RESPIRATORY_TRACT
  Filled 2024-02-13 (×4): qty 2

## 2024-02-13 MED ORDER — LOPERAMIDE HCL 2 MG PO CAPS
2.0000 mg | ORAL_CAPSULE | ORAL | Status: DC | PRN
Start: 2024-02-13 — End: 2024-02-17
  Administered 2024-02-14 – 2024-02-17 (×4): 2 mg via ORAL
  Filled 2024-02-13 (×4): qty 1

## 2024-02-13 MED ORDER — PREDNISONE 20 MG PO TABS
40.0000 mg | ORAL_TABLET | Freq: Every day | ORAL | Status: DC
  Administered 2024-02-14 – 2024-02-17 (×4): 40 mg via ORAL
  Filled 2024-02-13 (×4): qty 2

## 2024-02-13 MED ORDER — TIOTROPIUM BROMIDE MONOHYDRATE 18 MCG IN CAPS: 1.0000 | ORAL_CAPSULE | Freq: Every day | RESPIRATORY_TRACT | Status: DC

## 2024-02-13 MED ORDER — DM-GUAIFENESIN ER 30-600 MG PO TB12
1.0000 | ORAL_TABLET | Freq: Two times a day (BID) | ORAL | Status: DC | PRN
  Administered 2024-02-16 – 2024-02-17 (×2): 1 via ORAL
  Filled 2024-02-13 (×2): qty 1

## 2024-02-13 MED ORDER — PREDNISONE 20 MG PO TABS
10.0000 mg | ORAL_TABLET | Freq: Every day | ORAL | Status: DC
  Administered 2024-02-13: 10 mg via ORAL
  Filled 2024-02-13: qty 1

## 2024-02-13 MED ORDER — LIDOCAINE 5 % EX PTCH
1.0000 | MEDICATED_PATCH | CUTANEOUS | Status: DC
  Administered 2024-02-13 – 2024-02-17 (×5): 1 via TRANSDERMAL
  Filled 2024-02-13 (×5): qty 1

## 2024-02-13 MED ORDER — ROFLUMILAST 500 MCG PO TABS
500.0000 ug | ORAL_TABLET | Freq: Every day | ORAL | Status: DC
  Administered 2024-02-13 – 2024-02-17 (×5): 500 ug via ORAL
  Filled 2024-02-13 (×5): qty 1

## 2024-02-13 MED ORDER — AMOXICILLIN-POT CLAVULANATE 875-125 MG PO TABS: 1.0000 | ORAL_TABLET | Freq: Two times a day (BID) | ORAL | Status: DC

## 2024-02-13 MED ORDER — ALLOPURINOL 100 MG PO TABS
100.0000 mg | ORAL_TABLET | Freq: Every day | ORAL | Status: DC
  Administered 2024-02-13 – 2024-02-17 (×5): 100 mg via ORAL
  Filled 2024-02-13 (×5): qty 1

## 2024-02-13 MED ORDER — UMECLIDINIUM BROMIDE 62.5 MCG/ACT IN AEPB
1.0000 | INHALATION_SPRAY | Freq: Every day | RESPIRATORY_TRACT | Status: DC
  Filled 2024-02-13: qty 7

## 2024-02-13 MED ORDER — RIVAROXABAN 10 MG PO TABS
10.0000 mg | ORAL_TABLET | Freq: Every day | ORAL | Status: DC
  Administered 2024-02-13 – 2024-02-17 (×5): 10 mg via ORAL
  Filled 2024-02-13 (×5): qty 1

## 2024-02-13 MED ORDER — DICLOFENAC SODIUM 1 % EX GEL
4.0000 g | Freq: Four times a day (QID) | CUTANEOUS | Status: DC
  Administered 2024-02-13 – 2024-02-14 (×4): 4 g via TOPICAL
  Filled 2024-02-13: qty 100

## 2024-02-13 MED ORDER — ARFORMOTEROL TARTRATE 15 MCG/2ML IN NEBU
15.0000 ug | INHALATION_SOLUTION | Freq: Two times a day (BID) | RESPIRATORY_TRACT | Status: DC
  Administered 2024-02-13 – 2024-02-14 (×3): 15 ug via RESPIRATORY_TRACT
  Filled 2024-02-13 (×4): qty 2

## 2024-02-13 MED ORDER — POTASSIUM CHLORIDE 20 MEQ PO PACK
40.0000 meq | PACK | Freq: Once | ORAL | Status: AC
  Administered 2024-02-13: 40 meq via ORAL
  Filled 2024-02-13: qty 2

## 2024-02-13 MED ORDER — FLUTICASONE FUROATE-VILANTEROL 200-25 MCG/ACT IN AEPB
1.0000 | INHALATION_SPRAY | Freq: Every day | RESPIRATORY_TRACT | Status: DC
  Filled 2024-02-13: qty 28

## 2024-02-13 MED ORDER — AMOXICILLIN-POT CLAVULANATE 875-125 MG PO TABS
1.0000 | ORAL_TABLET | Freq: Two times a day (BID) | ORAL | Status: DC
Start: 2024-02-13 — End: 2024-02-17
  Administered 2024-02-13 – 2024-02-17 (×8): 1 via ORAL
  Filled 2024-02-13 (×10): qty 1

## 2024-02-13 MED ORDER — SODIUM CHLORIDE 0.9 % IV SOLN
2.0000 g | Freq: Once | INTRAVENOUS | Status: AC
  Administered 2024-02-13: 2 g via INTRAVENOUS
  Filled 2024-02-13: qty 12.5

## 2024-02-13 MED ORDER — ACETAMINOPHEN 325 MG PO TABS
650.0000 mg | ORAL_TABLET | Freq: Four times a day (QID) | ORAL | Status: DC | PRN
Start: 2024-02-13 — End: 2024-02-13

## 2024-02-13 MED ORDER — EZETIMIBE 10 MG PO TABS
10.0000 mg | ORAL_TABLET | Freq: Every day | ORAL | Status: DC
  Administered 2024-02-13 – 2024-02-17 (×5): 10 mg via ORAL
  Filled 2024-02-13 (×5): qty 1

## 2024-02-13 MED ORDER — POLYETHYLENE GLYCOL 3350 17 G PO PACK: 17.0000 g | PACK | Freq: Every day | ORAL | Status: DC | PRN

## 2024-02-13 MED ORDER — MONTELUKAST SODIUM 10 MG PO TABS
10.0000 mg | ORAL_TABLET | Freq: Every day | ORAL | Status: DC
  Administered 2024-02-13 – 2024-02-16 (×4): 10 mg via ORAL
  Filled 2024-02-13 (×4): qty 1

## 2024-02-13 MED ORDER — REVEFENACIN 175 MCG/3ML IN SOLN
175.0000 ug | Freq: Every day | RESPIRATORY_TRACT | Status: DC
  Administered 2024-02-14: 175 ug via RESPIRATORY_TRACT
  Filled 2024-02-13 (×3): qty 3

## 2024-02-13 MED ORDER — VANCOMYCIN HCL 2000 MG/400ML IV SOLN
2000.0000 mg | Freq: Once | INTRAVENOUS | Status: AC
  Administered 2024-02-13: 2000 mg via INTRAVENOUS
  Filled 2024-02-13: qty 400

## 2024-02-13 MED ORDER — INFLUENZA VAC SPLIT HIGH-DOSE 0.5 ML IM SUSY
0.5000 mL | PREFILLED_SYRINGE | INTRAMUSCULAR | Status: DC
  Filled 2024-02-13: qty 0.5

## 2024-02-13 MED ORDER — IPRATROPIUM-ALBUTEROL 0.5-2.5 (3) MG/3ML IN SOLN
3.0000 mL | RESPIRATORY_TRACT | Status: DC | PRN
  Filled 2024-02-13: qty 3

## 2024-02-13 NOTE — ED Provider Notes (Signed)
 [facility] EMERGENCY DEPARTMENT AT [facility] Provider Note   CSN: [id number] Arrival date & time: 02/13/24  9742     Patient presents with: Chest Pain   [name] [name]. is a 63 y.o. male.   The history is provided by the patient and medical records.  Chest Pain Associated symptoms: shortness of breath    63 y.o. M with hx of COPD on chronic 6L, gout, HTN, CKD, thrombocytopenia, presenting to the ED with shortness of breath.  Reports pain in his left back just below the ribs.  States pain comes and goes, lasts a few seconds at a time.  States for the past 3 days his breathing has been not right.  He is always some SOB but worse than usual.  EMS has come to the house a few times and checked him out but has not transported him until this evening.  Concentrator at home only goes up to 6L.  Denies cough or sick contacts.  No fever/chills.  Prior to Admission medications   Medication Sig Start Date End Date Taking? Authorizing Provider  albuterol  (PROVENTIL ) (2.5 MG/3ML) 0.083% nebulizer solution TAKE 3 MLS (2.5 MG TOTAL) BY NEBULIZATION EVERY 6 (SIX) HOURS AS NEEDED FOR SHORTNESS OF BREATH. 01/14/24   Newlin, Enobong, MD  albuterol  (VENTOLIN  HFA) 108 (90 Base) MCG/ACT inhaler Inhale 2 puffs into the lungs every 6 (six) hours as needed for wheezing or shortness of breath 01/18/24   Delbert Clam, MD  allopurinol  (ZYLOPRIM ) 100 MG tablet Take 1 tablet (100 mg total) by mouth daily. 10/10/23   Newlin, Enobong, MD  amLODipine  (NORVASC ) 10 MG tablet Take 1 tablet (10 mg total) by mouth daily. 10/10/23   Newlin, Enobong, MD  azithromycin  (ZITHROMAX ) 500 MG tablet Take 1 tablet (500 mg total) by mouth 3 (three) times a week. Take 1 tablet (500 mg total) by mouth 3 (three) times a week. (Monday, Wednesday, Friday) 01/23/24   Smucker, Melvenia, MD  budesonide -formoterol  (SYMBICORT ) 160-4.5 MCG/ACT inhaler Inhale 2 puffs into the lungs 2 (two) times daily. 01/22/24   Mannam, Praveen, MD   cyanocobalamin  (VITAMIN B12) 1000 MCG tablet Take 1,000 mcg by mouth daily.    [provider]  ezetimibe  (ZETIA ) 10 MG tablet Take 1 tablet (10 mg total) by mouth daily. 11/27/23   Newlin, Enobong, MD  loperamide  (IMODIUM ) 2 MG capsule Take 1 capsule (2 mg total) by mouth as needed for diarrhea or loose stools. 01/23/24   Smucker, Melvenia, MD  montelukast  (SINGULAIR ) 10 MG tablet Take 1 tablet (10 mg total) by mouth at bedtime. 10/25/23 05/07/24  Newlin, Enobong, MD  OXYGEN  Inhale 10 L into the lungs continuous.    [provider]  predniSONE  (DELTASONE ) 10 MG tablet Take 1 tablet (10 mg total) by mouth daily with breakfast. 01/23/24   Smucker, Melvenia, MD  roflumilast  (DALIRESP ) 500 MCG TABS tablet Take 1 tablet (500 mcg total) by mouth daily. 01/28/24 01/27/25  Mannam, Praveen, MD  tiotropium (SPIRIVA  HANDIHALER) 18 MCG inhalation capsule PLACE 1 CAPSULE (18 MCG TOTAL) INTO INHALER AND INHALE DAILY. 12/19/23   Delbert Clam, MD  Tiotropium Bromide  (SPIRIVA  HANDIHALER) 18 MCG CAPS Place 1 capsule into inhaler and inhale daily. 12/19/23   Newlin, Enobong, MD  tiZANidine  (ZANAFLEX ) 4 MG tablet Take 1 tablet (4 mg total) by mouth every 8 (eight) hours as needed for muscle spasms. 02/05/24   Newlin, Enobong, MD  Vitamin D , Ergocalciferol , (DRISDOL ) 1.25 MG (50000 UNIT) CAPS capsule Take 1 capsule (50,000 Units  total) by mouth every 7 (seven) days. 12/05/23 03/13/24  Newlin, Enobong, MD  ALBUTEROL  IN Inhale into the lungs.  07/03/11  [provider]    Allergies: Lipitor [atorvastatin ], Shellfish allergy, Beef (diagnostic), Bovine (beef) protein-containing drug products, and Colcrys  [colchicine ]    Review of Systems  Respiratory:  Positive for shortness of breath.   Cardiovascular:  Positive for chest pain.  All other systems reviewed and are negative.   Updated Vital Signs BP (!) 143/74   Pulse 100   Resp (!) 24   Ht 5' 7 (1.702 m)   Wt 81 kg   SpO2 94%   BMI  27.97 kg/m   Physical Exam Vitals and nursing note reviewed.  Constitutional:      Appearance: He is well-developed.  HENT:     Head: Normocephalic and atraumatic.  Eyes:     Conjunctiva/sclera: Conjunctivae normal.     Pupils: Pupils are equal, round, and reactive to light.  Cardiovascular:     Rate and Rhythm: Normal rate and regular rhythm.     Heart sounds: Normal heart sounds.  Pulmonary:     Effort: Pulmonary effort is normal.     Breath sounds: Normal breath sounds.     Comments: HFNC at 8L in use, speaking in sentences with this, does not appear distressed currently Abdominal:     General: Bowel sounds are normal.     Palpations: Abdomen is soft.  Musculoskeletal:        General: Normal range of motion.     Cervical back: Normal range of motion.     Comments: No significant LE edema  Skin:    General: Skin is warm and dry.  Neurological:     Mental Status: He is alert and oriented to person, place, and time.     (all labs ordered are listed, but only abnormal results are displayed) Labs Reviewed  BASIC METABOLIC PANEL WITH GFR - Abnormal; Notable for the following components:      Result Value   Chloride 95 (*)    All other components within normal limits  CBC - Abnormal; Notable for the following components:   WBC 17.2 (*)    Hemoglobin 11.5 (*)    HCT 37.6 (*)    MCV 74.9 (*)    MCH 22.9 (*)    RDW 17.0 (*)    All other components within normal limits  RESP PANEL BY RT-PCR (RSV, FLU A&B, COVID)  RVPGX2  MRSA NEXT GEN BY PCR, NASAL  BRAIN NATRIURETIC PEPTIDE  TROPONIN I (HIGH SENSITIVITY)  TROPONIN I (HIGH SENSITIVITY)    EKG: None  Radiology: DG Chest Port 1 View Result Date: 02/13/2024 EXAM: 1 VIEW(S) XRAY OF THE CHEST 02/13/2024 03:23:00 AM COMPARISON: 01/19/2024 CLINICAL HISTORY: SOB, COPD FINDINGS: LUNGS AND PLEURA: Hyperlucent lungs consistent with emphysema. New left mid lung consolidation. Improved right lower lung zone airspace disease.  Less improved left lower lung zone airspace disease. No pulmonary edema. No pleural effusion. No pneumothorax. HEART AND MEDIASTINUM: No acute abnormality of the cardiac and mediastinal silhouettes. BONES AND SOFT TISSUES: No acute osseous abnormality. IMPRESSION: 1. Emphysema. New left mid lung consolidation, suggesting acute infectious exacerbation. 2. Elsewhere Improved lower lung ventilation since October. No definite pleural effusion Electronically signed by: Helayne Hurst MD 02/13/2024 03:50 AM EST RP Workstation: [id number]     Procedures   CRITICAL CARE Performed by: Olam CHRISTELLA Slocumb   Total critical care time: 45 minutes  Critical care time was exclusive of separately  billable procedures and treating other patients.  Critical care was necessary to treat or prevent imminent or life-threatening deterioration.  Critical care was time spent personally by me on the following activities: development of treatment plan with patient and/or surrogate as well as nursing, discussions with consultants, evaluation of patient's response to treatment, examination of patient, obtaining history from patient or surrogate, ordering and performing treatments and interventions, ordering and review of laboratory studies, ordering and review of radiographic studies, pulse oximetry and re-evaluation of patient's condition.   Medications Ordered in the ED  ceFEPIme  (MAXIPIME ) 2 g in sodium chloride  0.9 % 100 mL IVPB (has no administration in time range)  vancomycin (VANCOREADY) IVPB 2000 mg/400 mL (has no administration in time range)                                    Medical Decision Making Amount and/or Complexity of Data Reviewed Labs: ordered. Radiology: ordered and independent interpretation performed. ECG/medicine tests: ordered and independent interpretation performed.  Risk Prescription drug management. Decision regarding hospitalization.   63 year old male presenting to the ED with  shortness of breath.  On 6-10L at baseline, was only 93% with 6 L on arrival.  He was immediately transition to high flow nasal cannula at 8 L with improvement.  Reports pain in his left posterior rib cage, worse with lying flat better with sitting up.  Denies any significant cough or fever.  He is afebrile and nontoxic in appearance here.  He is breathing easily on the high flow, no acute distress noted.  Labs and chest x-ray ordered along with RVP.  Labs with leukocytosis, albeit improved from prior.  Suspect some of this may be due to ongoing steroid use.  No significant electrolyte derangement.  BNP WNL.  Trop negative.  RVP pending.  CXR with left mid lung infiltrate, favored acute infectious process.  He was previously on azithromycin  within the past 30 days, will step up antibiotics to vanc/cefepime .  Discussed with IM teaching service, they will admit for ongoing care.  Final diagnoses:  Pneumonia of left lower lobe due to infectious organism    ED Discharge Orders     None          Jarold Olam HERO, PA-C 02/13/24 0439    Trine Raynell Moder, MD 02/13/24 [phone]

## 2024-02-13 NOTE — ED Notes (Signed)
 MD at bedside.

## 2024-02-13 NOTE — ED Notes (Signed)
 CCMD called.

## 2024-02-13 NOTE — ED Triage Notes (Signed)
 PT brought in by [facility], PT is from home complaint of chest pain states it hurts more on the right side, PT states when he passes gas he feels better. PT wears 6L of O2 at home and cpap at home. PT is alert and talking. PT states pain on the back left side under his rib. 93% on 6L

## 2024-02-13 NOTE — H&P (Addendum)
 Date: 02/13/2024               Patient Name:  [name] [name]. MRN: [id number]  DOB: 14-Jan-1961 Age / Sex: 63 y.o., male   PCP: Delbert Clam, MD         Medical Service: Internal Medicine Teaching Service         Attending Physician: Dr. Reyes Fenton      First Contact: Viktoria King, DO}    Second Contact: Dr. Damien Lease, DO         Pager Information: First Contact Pager: [phone]   Second Contact Pager: [phone]   SUBJECTIVE   Chief Complaint: Shortness of breath and left-sided back and chest wall discomfort   History of Present Illness: [name] [name]. is a 63 y.o. 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  He reports that last night he experienced intermittent discomfort along his left back, just below the shoulder blade and left side chest wall, which he describes as annoying but not painful, similar to gas that improves after burping. The sensation comes and goes, lasting only a few seconds at a time. He denies sharp or pressure-like chest pain, radiation or associated diaphoresis.  Over the past three days, he has noticed that his breathing has been not right,  slightly worse than his usual baseline. He normally uses 10 L of oxygen  continuously and increases to 15 L with ambulation due to chronic exertional dyspnea. His concentrator at home only goes up to 6 L, so he called EMS when his breathing worsened; EMS had evaluated him multiple times at home over the past few days without transport until this episode.  He denies fever, chills, or cough and has not noticed any change in the amount, color, or thickness of his sputum. He also denies nasal congestion, sore throat, sick contacts (aside from grandchildren with mild colds), or increased use of inhalers. He reports no chest tightness, wheezing beyond baseline, or swelling in the legs. He  has chronic intermittent diarrhea related to medications but no abdominal pain, nausea, or vomiting. No dysuria or urinary changes.  He was recently hospitalized from 10/11 to 01/23/24 for a COPD exacerbation secondary to CAP, discharged on azithromycin  (500 mg M/W/F), prednisone  10 mg daily, and his home inhaler regimen. He reports good adherence to all medications.  Currently, he denies shortness of breath, fever, cough, or new sputum changes. He continues to have mild intermittent left posterior chest discomfort that resolves spontaneously.  ED Course: In the ED, he was found to be mildly tachycardic and tachypneic with O2 sat 93% on 6 L; he was transitioned to high-flow nasal cannula at 8 L with improvement. Labs revealed leukocytosis to 17.2 , normal BNP and troponin, and negative viral PCR. CXR demonstrated new left mid-lung consolidation with interval improvement of right-sided opacities, consistent with an acute infectious process. He was started on empiric vancomycin and cefepime . - On arrival, afebrile, HR 100, RR 24, SpO? 93 % on 6 L O?. Transitioned to HFNC 8 L and improved saturations.  Labs: WBC 17.2, Hgb 11.5,  BMP unremarkable; BNP 37; trop negative; RVP negative. CXR: new left mid-lung consolidation consistent with acute infectious process; improvement of prior right lower-lobe disease. Received vancomycin + cefepime  in ED; admitted to IMTS  Meds:  Patient reported:  Prednisone  10 mg daily Azithromycin  500 mg three times weekly (  Mon/Wed/Fri) Symbicort  160/4.5 inhaler - 2 puffs BID Spiriva   inhaler 18 mcg inhaled daily Montelukast  10 mg nightly Roflumilast  500 mcg daily Albuterol  inhaler - 2 puffs q6h PRN Albuterol  nebulizer (2.5 mg/3 mL) q6h PRN Amlodipine  10 mg daily Allopurinol  100 mg daily Ezetimibe  (Zetia ) 10 mg daily Vitamin D  50,000 units weekly Vitamin B12 1000 mcg daily Tizanidine  4 mg q8h PRN muscle spasms Loperamide  2 mg PRN diarrhea Oxygen  - 10 L continuous,  15 L with ambulation   Past Medical History End-stage COPD (on 10 L O2) Hypertension Chronic Kidney Disease (Stage II-III) Gout Thrombocytopenia Hyperlipidemia  Past Surgical History Past Surgical History:  Procedure Laterality Date   FRACTURE SURGERY  childhood   right arm   KNEE SURGERY     right knee s/p trauma   Social:  Lives With:Lives with daughters and grandchildren. Occupation:Not working.  Support:Good family support. Level of Function:Ambulates and performs ADLs independently. PCP:  Delbert Clam, MD  Substances: -Tobacco: Former Smoker -Alcohol: no current alcohol or drug us  -Recreational Drug: None  Family History:  Family History  Problem Relation Age of Onset   Cancer Mother        colon    Heart disease Father    Colon cancer Other        mother ? age of diagnosis    Healthy Son    Healthy Daughter      Allergies: Allergies as of 02/13/2024 - Review Complete 02/13/2024  Allergen Reaction Noted   Lipitor [atorvastatin ] Itching and Swelling 10/30/2014   Shellfish allergy Anaphylaxis 07/28/2012   Beef (diagnostic) Other (See Comments) 09/09/2023   Bovine (beef) protein-containing drug products Hives and Other (See Comments) 12/12/2023   Colcrys  [colchicine ] Hives 10/13/2014    Review of Systems: A complete ROS was negative except as per HPI.   OBJECTIVE:   Physical Exam: Blood pressure (!) 149/98, pulse (!) 106, temperature 98.7 F (37.1 C), temperature source Oral, resp. rate 18, height 5' 7 (1.702 m), weight 81 kg, SpO2 99%.  Constitutional: Well-appearing, sitting upright in bed, no acute distress HENT: Normocephalic, atraumatic, mucous membranes moist Eyes: Conjunctiva clear Cardiovascular: Tachycardia with normal rhythm, no murmur Pulmonary/Chest: Tachypnea, diminished breath sounds bilaterally consistent with emphysema; no wheezes, rales, or rhonchi Abdominal: Soft, non-tender, non-distended MSK: Normal bulk and tone; no LE  edema; no tenderness over left posterior thorax on palpation  Neuro: Alert and oriented  3, strength 5/5 throughout Skin: Warm, dry, intact Psych: Pleasant, cooperative, appropriate affect  Labs: CBC    Component Value Date/Time   WBC 17.2 (H) 02/13/2024 0307   RBC 5.02 02/13/2024 0307   HGB 11.5 (L) 02/13/2024 0307   HGB 14.0 12/26/2023 1436   HCT 37.6 (L) 02/13/2024 0307   HCT 46.4 12/26/2023 1436   PLT 226 02/13/2024 0307   PLT 297 12/26/2023 1436   MCV 74.9 (L) 02/13/2024 0307   MCV 78 (L) 12/26/2023 1436   MCH 22.9 (L) 02/13/2024 0307   MCHC 30.6 02/13/2024 0307   RDW 17.0 (H) 02/13/2024 0307   RDW 13.6 12/26/2023 1436   LYMPHSABS 0.8 12/26/2023 1436   MONOABS 1.0 12/13/2023 0906   EOSABS 0.0 12/26/2023 1436   BASOSABS 0.0 12/26/2023 1436     CMP     Component Value Date/Time   NA 135 02/13/2024 0307   NA 141 05/25/2022 1043   K 3.5 02/13/2024 0307   CL 95 (L) 02/13/2024 0307   CO2 29 02/13/2024 0307   GLUCOSE 94 02/13/2024 0307  BUN 11 02/13/2024 0307   BUN 8 05/25/2022 1043   CREATININE 0.96 02/13/2024 0307   CREATININE 1.50 (H) 09/17/2015 0955   CALCIUM  9.9 02/13/2024 0307   PROT 7.9 10/31/2023 1943   PROT 6.4 05/25/2022 1043   ALBUMIN 3.5 10/31/2023 1943   ALBUMIN 3.7 (L) 05/25/2022 1043   AST 16 10/31/2023 1943   ALT 13 10/31/2023 1943   ALKPHOS 83 10/31/2023 1943   BILITOT 0.9 10/31/2023 1943   BILITOT 0.9 05/25/2022 1043   GFRNONAA >60 02/13/2024 0307   GFRNONAA 51 (L) 09/17/2015 0955   GFRAA >60 10/30/2018 0703   GFRAA 59 (L) 09/17/2015 0955    Imaging:  DG Chest Port 1 View Result Date: 02/13/2024 EXAM: 1 VIEW(S) XRAY OF THE CHEST 02/13/2024 03:23:00 AM COMPARISON: 01/19/2024 CLINICAL HISTORY: SOB, COPD FINDINGS: LUNGS AND PLEURA: Hyperlucent lungs consistent with emphysema. New left mid lung consolidation. Improved right lower lung zone airspace disease. Less improved left lower lung zone airspace disease. No pulmonary edema. No pleural  effusion. No pneumothorax. HEART AND MEDIASTINUM: No acute abnormality of the cardiac and mediastinal silhouettes. BONES AND SOFT TISSUES: No acute osseous abnormality. IMPRESSION: 1. Emphysema. New left mid lung consolidation, suggesting acute infectious exacerbation. 2. Elsewhere Improved lower lung ventilation since October. No definite pleural effusion Electronically signed by: Helayne Hurst MD 02/13/2024 03:50 AM EST RP Workstation: [id number]     EKG: personally reviewed my interpretation is sinus tachycardia with no ischemic changes.  ASSESSMENT & PLAN:   Assessment & Plan by Problem: Principal Problem:   CAP (community acquired pneumonia) Active Problems:   Gout   Hypertension   Chronic hypoxic respiratory failure (HCC)   COPD with acute lower respiratory infection (HCC)   [name] [name]. is a 63 year old man with end-stage COPD on 10 L O?, HTN, CKD, and gout, presenting with new left mid-lung consolidation and mild positional left back discomfort, consistent with community-acquired pneumonia and chronic hypoxic respiratory failure, currently stable on baseline oxygen  and improving on Augmentin, Azithromycin , prednisone , and supportive care (Duonebs, inhalers, BiPAP, lidocaine  patch).  #Community Acquired Pneumonia #Chronic Hypoxic Respiratory Failure / End-Stage COPD Recent hospitalization (10/11-10/15) for COPD exacerbation with RLL pneumonia; now returns with new left mid-lung consolidation and leukocytosis (WBC 17 K). He denies fever, cough, or sputum change, but positional left back discomfort and imaging are consistent with acute infectious process. Baseline 10 L O2 at rest, 15 L with ambulation; currently comfortable on home settings. No wheezing, sputum change, or acute distress. Mild leukocytosis likely steroid-related. Initially treated with broad-spectrum coverage (vanc/cefepime ) and quickly de-escalated to Augmentin and Azithromycin  given stable condition and no risk  factors for Pseudomonas/MRSA. Currently afebrile, saturating well on baseline oxygen , clinically improving.  Plan - Continue Augmentin 875 mg BID + Azithromycin  500 mg daily - Encourage incentive spirometry and ambulation - Continue home inhalers: Symbicort , Spiriva , Montelukast , Roflumilast  - Prednisone  10 mg daily (chronic) - Duoneb q4h PRN - continue nightly BiPAP - Wean to baseline O2 as tolerated - Continue pulmonary hygiene and rehab efforts  #Chest/Back Discomfort Intermittent left posterior chest discomfort, mild and positional, worse lying flat, improved sitting up. No ischemic features; reproducible on palpation. Could represent musculoskeletal strain or pleuritic irritation from pneumonia. Plan - Lidocaine  patch daily to affected area - Acetaminophen  PRN for pain - Encourage repositioning and gentle stretching - Monitor for any new pleuritic or cardiac features  #Hypertension Stable pressures (SBP 140s) on Amlodipine  10 mg daily. Plan Continue Amlodipine  10 mg daily Monitor BP daily while inpatient  #  Gout Chronic, well controlled on Allopurinol  100 mg daily. No acute flare. Plan Continue Allopurinol  100 mg daily  #Microcytic Anemia Stable mild anemia (Hgb 11.5, MCV 75), consistent with chronic baseline; asymptomatic. Plan Trend CBC Outpatient iron  studies if persistent  #CKD Stage II Stable renal function (Cr 0.96, eGFR > 60). Plan Avoid nephrotoxins  #Hyperlipidemia On Zetia  10 mg daily, intolerant to statins. Plan Continue Zetia  10 mg daily  Best practice: Diet: Normal VTE: DOAC IVF: None,None Code: DNR  Disposition planning: Prior to Admission Living Arrangement: Home, living   Anticipated Discharge Location: Home  Dispo: Admit patient to Observation with expected length of stay less than 2 midnights.  Signed: Bernadine Manos, MD Internal Medicine Resident  02/13/2024, 5:47 AM  On Call pager: [phone]

## 2024-02-13 NOTE — Progress Notes (Signed)
 IMTS Interim Progress Note  Revisited patient for concern of use of BiPAP and increasing heart rate.  S: Pt was sitting in bed leaning over his side table playing a game on his phone. He says that right now he is at his baseline in terms of comfort, and that he often spends much of the day leaning over as such, but is also current comfortable sitting upright. He uses a BiPAP instead of CPAP for sleeping at night and for naps. Also reports that his current episode that brought him to the hospital was in the context of his grandson vaping inside the house, leading to some irritation to his breathing.  O: BP 138/86   Pulse (!) 123   Temp 98.6 F (37 C)   Resp (!) 31   Ht 5' 7 (1.702 m)   Wt 81 kg   SpO2 93%   BMI 27.97 kg/m    Lungs: Decreased breath sounds and wheezing throughout. Some crackles in LLL  A/P: - Increased Prednisone  dose to 40mg  daily - Adjusted inhaled medications to nebulized medications (Revefenacin , Arformoterol , Duoneb) - Broader workup for etiology given pt DNI status - Waiting for PT ambulatory pulse ox - If vitals continue to worsen, consider Pulm consult.  Lera Nancyann NOVAK, DO 02/13/2024, 3:20 PM PGY-1, [facility] Family Medicine Service pager [phone]

## 2024-02-13 NOTE — Telephone Encounter (Signed)
 In-patient at [facility]

## 2024-02-13 NOTE — Hospital Course (Addendum)
#   Community Acquired Pneumonia # Chronic Hypoxic Respiratory Failure / End-Stage COPD Pt came to the ED for concern of slightly worsened difficulty breathing in the setting of his grandson vaping more in the house. He is on 10L O2 via [place] at baseline, and wears a BiPAP at home at night and during naps. He has also been tachycardic while admitted. Imaging was suggestive of small CAP, but also visualized the spiculated lesion which was scheduled to have an outpt PET scan at [facility] on 02/14/2024 which required rescheduling. He was given an increased dose of his home prednisone , and nebulized LAMA/LABA treatment to which he responded well. He was also given Augmentin and Azithromycin  for CAP coverage to which he had some diarrhea, but otherwise tolerated well. His comfort breathing improved to some degree, thought not back to baseline. It was communicated to him multiple times that given his end-stage COPD, it may never come back to baseline.  # Chest/Back Discomfort Pt presented with intermittent left posterior chest discomfort which was mild and positional. There were no found ischemic features and was reproducible on palpation and helped with a lidocaine  patch. Unclear if this is from a musculoskeletal strain from sleeping on that side, or from pleuritic irritation as this is over the site of the pneumonia. The lidocaine  patch has been beneficial for the pain.  # Stable Conditions The following conditions have been monitored and well managed with the patient's home therapy: HTN, Gout, Microcytic Anemia, CKD Stage II, Hyperlipidemia.

## 2024-02-13 NOTE — Progress Notes (Signed)
 ED Pharmacy Antibiotic Sign Off An antibiotic consult was received from an ED provider for cefepime /vancomycin per pharmacy dosing for pneumonia. A chart review was completed to assess appropriateness.   The following one time order(s) were placed:   -Cefepime  2g IV x1 -Vancomycin 2g IV x1  Further antibiotic and/or antibiotic pharmacy consults should be ordered by the admitting provider if indicated.   Thank you for allowing pharmacy to be a part of this patient's care.   Lynwood Poplar, [facility]  Clinical Pharmacist 02/13/24 4:10 AM

## 2024-02-13 NOTE — Progress Notes (Signed)
 HD#0 SUBJECTIVE:  Patient Summary: [name] [name]. is a 63 y.o. 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   Overnight Events: Pt was admitted overnight.  Interim History:  Pt was requesting his O2 be turned up to 10L despite being on 96% because of experienced SOB. Pt is tripoding and uncomfortable.  No history of chest pain until current wall pain, which is not reproducible on palpation. It hurts when he lays on the left side, and does not hurt more when he takes a deep breath. Denies cough and fevers. Has has loose bowel movements which he suspects is from antibiotics he was on, and these were only when he was taking antibiotics from his past hospitalization. Is eating well w/o N/V. Reports drinking lots of fluids, not missing any medications, no abdominal pain. Lidocaine  patch has been helpful for pain. Grandson whom he lives with has a cold.  DNR/DNI, but okay with shocks.  OBJECTIVE:  Vital Signs: Vitals:   02/13/24 0930 02/13/24 1000 02/13/24 1015 02/13/24 1045  BP: 137/83 133/85 (!) 152/86 134/87  Pulse: 100 (!) 115 (!) 116 (!) 117  Resp: (!) 21 (!) 30 (!) 32 (!) 24  Temp:      TempSrc:      SpO2: 100% 94% 92% 100%  Weight:      Height:       Supplemental O2: Nasal Cannula SpO2: 100 % O2 Flow Rate (L/min): 9 L/min FiO2 (%): 60 %  Filed Weights   02/13/24 0305  Weight: 81 kg     Intake/Output Summary (Last 24 hours) at 02/13/2024 1249 Last data filed at 02/13/2024 1048 Gross per 24 hour  Intake 500.52 ml  Output 900 ml  Net -399.48 ml   Net IO Since Admission: -399.48 mL [02/13/24 1249]  Physical Exam: Physical Exam Vitals reviewed.  Constitutional:      Appearance: He is ill-appearing.  Pulmonary:     Effort: Respiratory distress present.     Breath sounds: Decreased air movement present.     Comments:  Tripoding, leaning forward to breath better Abdominal:     Palpations: Abdomen is soft.     Tenderness: There is no abdominal tenderness.  Musculoskeletal:     Right lower leg: No edema.     Left lower leg: No edema.  Skin:    General: Skin is warm and dry.  Neurological:     Mental Status: He is alert.     Patient Lines/Drains/Airways Status     Active Line/Drains/Airways     Name Placement date Placement time Site Days   Peripheral IV 02/13/24 20 G Left Antecubital 02/13/24  0258  Antecubital  less than 1            Pertinent labs and imaging:      Latest Ref Rng & Units 02/13/2024    3:07 AM 01/22/2024    4:07 AM 01/21/2024    5:06 AM  CBC  WBC 4.0 - 10.5 K/uL 17.2  20.5  20.9   Hemoglobin 13.0 - 17.0 g/dL 88.4  88.8  88.6   Hematocrit 39.0 - 52.0 % 37.6  36.1  36.6   Platelets 150 - 400 K/uL 226  521  538        Latest Ref Rng & Units 02/13/2024    3:07 AM 01/20/2024  3:42 AM 01/19/2024    1:26 PM  CMP  Glucose 70 - 99 mg/dL 94  889  883   BUN 8 - 23 mg/dL 11  12  9    Creatinine 0.61 - 1.24 mg/dL 9.03  8.97  8.95   Sodium 135 - 145 mmol/L 135  136  138   Potassium 3.5 - 5.1 mmol/L 3.5  4.6  4.1   Chloride 98 - 111 mmol/L 95  98  98   CO2 22 - 32 mmol/L 29  30  29    Calcium  8.9 - 10.3 mg/dL 9.9  9.3  9.7     CT Angio Chest Pulmonary Embolism (PE) W or WO Contrast Result Date: 02/13/2024 CLINICAL DATA:  Chest pain worse on right side. Possible pulmonary embolism. EXAM: CT ANGIOGRAPHY CHEST WITH CONTRAST TECHNIQUE: Multidetector CT imaging of the chest was performed using the standard protocol during bolus administration of intravenous contrast. Multiplanar CT image reconstructions and MIPs were obtained to evaluate the vascular anatomy. RADIATION DOSE REDUCTION: This exam was performed according to the departmental dose-optimization program which includes automated exposure control, adjustment of the mA and/or kV according to patient size and/or use of  iterative reconstruction technique. CONTRAST:  75mL OMNIPAQUE  IOHEXOL  350 MG/ML SOLN COMPARISON:  11/01/2023, 07/31/2023 and 10/30/2018 FINDINGS: Cardiovascular: Right is within normal in size. Mild calcified plaque over the left anterior descending and right coronary arteries. Thoracic aorta is normal in caliber. Mild calcified plaque over the descending thoracic aorta. Pulmonary arterial system is well opacified without evidence of emboli. Remaining vascular structures are unremarkable. Mediastinum/Nodes: Cluster of lymph nodes over the AP window with the largest measuring approximately 1 cm by short axis without significant change from April 2025, although increased in size compared to 2020. No hilar adenopathy. Remaining mediastinal structures are unremarkable. Lungs/Pleura: Lungs are adequately inflated with moderate centrilobular emphysematous disease. There is a spiculated nodular opacity over the posterior right upper lobe (image 47) without significant change compared to 07/31/2023, although new since 2020. Mild scarring/atelectatic change over the medial inferior aspect of the right middle lobe and also posterior basilar right lower lobe which is stable. Subtle interstitial prominence over the periphery of the right lower lobe which is stable. Waxing and waning process over the lingula has this irregular focal airspace density decreased in size from April 2025 to July 2025 as there is now a larger more confluent irregular airspace density on the current exam extending to the pleura. Mild increased subpleural reticular density over the lingula with hazy opacification of the inferior lingula. No effusion. Remainder the lungs are unchanged. Airways unremarkable. Upper Abdomen: Mild calcified plaque over the abdominal aorta. Cholesterol gallstones. No acute findings. Musculoskeletal: No focal abnormality. Review of the MIP images confirms the above findings. IMPRESSION: 1. No evidence of pulmonary embolism. 2.  Waxing and waning process over the lingula where there is now a larger more confluent irregular airspace density extending to the pleura. Mild increased subpleural reticular density over the lingula with hazy opacification of the inferior lingula. Findings may be due to an acute on chronic process likely of infectious or inflammatory nature. Recommend follow-up CT 6 weeks. 3. Spiculated nodular opacity over the posterior right upper lobe without significant change compared to 07/31/2023, although new since 2020. Recommend referral to [facility]) for consideration of PET-CT versus tissue sampling. 4. Cluster of lymph nodes over the AP window with the largest measuring approximately 1 cm by short axis without significant change from April 2025,  although increased in size compared to 2020. 5. Moderate centrilobular emphysematous disease. 6. Cholelithiasis. 7. Aortic atherosclerosis. Atherosclerotic coronary artery disease. Aortic Atherosclerosis (ICD10-I70.0) and Emphysema (ICD10-J43.9). Electronically Signed   By: Toribio Agreste M.D.   On: 02/13/2024 11:35   DG Chest Port 1 View Result Date: 02/13/2024 EXAM: 1 VIEW(S) XRAY OF THE CHEST 02/13/2024 03:23:00 AM COMPARISON: 01/19/2024 CLINICAL HISTORY: SOB, COPD FINDINGS: LUNGS AND PLEURA: Hyperlucent lungs consistent with emphysema. New left mid lung consolidation. Improved right lower lung zone airspace disease. Less improved left lower lung zone airspace disease. No pulmonary edema. No pleural effusion. No pneumothorax. HEART AND MEDIASTINUM: No acute abnormality of the cardiac and mediastinal silhouettes. BONES AND SOFT TISSUES: No acute osseous abnormality. IMPRESSION: 1. Emphysema. New left mid lung consolidation, suggesting acute infectious exacerbation. 2. Elsewhere Improved lower lung ventilation since October. No definite pleural effusion Electronically signed by: Helayne Hurst MD 02/13/2024 03:50 AM EST RP Workstation:  [id number]    ASSESSMENT/PLAN:  Assessment: Principal Problem:   CAP (community acquired pneumonia) Active Problems:   Gout   Hypertension   End stage COPD (HCC)   Chronic hypoxic respiratory failure (HCC)   COPD with acute lower respiratory infection (HCC)   Quadre Bristol. is a 63 year old man with end-stage COPD on 10 L O?, HTN, CKD, and gout, presenting with new left mid-lung consolidation and mild positional left back discomfort, consistent with community-acquired pneumonia and chronic hypoxic respiratory failure, currently stable on baseline oxygen  and improving on Augmentin, Azithromycin , prednisone , and supportive care (Duonebs, inhalers, BiPAP, lidocaine  patch).   #Community Acquired Pneumonia #Chronic Hypoxic Respiratory Failure / End-Stage COPD Recent hospitalization (10/11-10/15) for COPD exacerbation with RLL pneumonia; now returns with new left mid-lung consolidation and leukocytosis (WBC 17 K). He denies fever, cough, or sputum change, but positional left back discomfort and imaging are consistent with acute infectious process. Baseline 10 L O2 at rest, 15 L with ambulation; currently comfortable on home settings. No wheezing, sputum change, or acute distress. Mild leukocytosis likely steroid-related. Initially treated with broad-spectrum coverage (vanc/cefepime ) and quickly de-escalated to Augmentin and Azithromycin  given stable condition and no risk factors for Pseudomonas/MRSA. Currently afebrile, saturating well on baseline oxygen , clinically improving. CTA negative for PE, showed spiculated nodular opacity which was scheduled to receive a PET scan tomorrow, will likely need to reschedule.   Plan - Continue Augmentin 875 mg BID + Azithromycin  500 mg daily - Encourage incentive spirometry and ambulation - Continue home Symbicort  (Breo ellipta ), Montelukast , and Roflumilast  - Prednisone  10 mg daily (chronic) - Duoneb q4h PRN - continue nightly BiPAP - Wean to  baseline O2 as tolerated - Continue pulmonary hygiene and rehab efforts  #Chest/Back Discomfort Intermittent left posterior chest discomfort, mild and positional, worse lying flat, improved sitting up. No ischemic features; reproducible on palpation. Could represent musculoskeletal strain or pleuritic irritation from pneumonia. Lidocaine  patch has been helpful. Plan - Lidocaine  patch daily to affected area - Acetaminophen  PRN for pain - Encourage repositioning and gentle stretching - Monitor for any new pleuritic or cardiac features  #Hypertension Stable pressures (SBP 140s) on Amlodipine  10 mg daily. Plan - Continue Amlodipine  10 mg daily - Monitor BP daily while inpatient  #Gout Chronic, well controlled on Allopurinol  100 mg daily. No acute flare. Allergy to Colchicine  (Hives). Plan - Continue Allopurinol  100 mg daily  #Microcytic Anemia Stable mild anemia (Hgb 11.5, MCV 75), consistent with chronic baseline; asymptomatic. Plan - Trend CBC - Outpatient iron  studies if persistent  #CKD Stage II Stable renal  function (Cr 0.96, eGFR > 60). Plan - Avoid nephrotoxins  #Hyperlipidemia On Zetia  10 mg daily, intolerant to statins. Plan - Continue Zetia  10 mg daily  Best Practice: Diet: Regular diet VTE: rivaroxaban  (XARELTO ) tablet 10 mg Start: 02/13/24 1000 Code: DNR/DNI, but is ok with shocks (not compressions)  Disposition planning: Therapy Recs: Pending DISPO: Anticipated discharge in 2-3 days location pending. Pending clinical improvement.  Signature:  Penne Lera [facility]  12:49 PM, 02/13/2024  On Call pager [phone]

## 2024-02-13 NOTE — Progress Notes (Signed)
 PT placed on BIPAP at this time.   02/13/24 2207  BiPAP/CPAP/SIPAP  BiPAP/CPAP/SIPAP Pt Type Adult  BiPAP/CPAP/SIPAP SERVO  Mask Type Full face mask  Dentures removed? Not applicable  Mask Size Medium  Respiratory Rate 23 breaths/min  PEEP 8 cmH20  FiO2 (%) 60 %  Flow Rate 0 lpm  Minute Ventilation 13.4  Leak 11  Peak Inspiratory Pressure (PIP) 13  Tidal Volume (Vt) 806  Patient Home Machine No  Patient Home Mask No  Patient Home Tubing No  Auto Titrate No  Press High Alarm 20 cmH2O  Press Low Alarm 2 cmH2O  Nasal massage performed Yes  CPAP/SIPAP surface wiped down Yes  Device Plugged into RED Power Outlet Yes  Oxygen  Percent 60 %

## 2024-02-14 ENCOUNTER — Ambulatory Visit (HOSPITAL_COMMUNITY): Admission: RE | Admit: 2024-02-14 | Source: Ambulatory Visit

## 2024-02-14 DIAGNOSIS — J189 Pneumonia, unspecified organism: Secondary | ICD-10-CM | POA: Diagnosis not present

## 2024-02-14 DIAGNOSIS — J441 Chronic obstructive pulmonary disease with (acute) exacerbation: Secondary | ICD-10-CM | POA: Diagnosis not present

## 2024-02-14 DIAGNOSIS — Z9981 Dependence on supplemental oxygen: Secondary | ICD-10-CM | POA: Diagnosis not present

## 2024-02-14 DIAGNOSIS — Z7951 Long term (current) use of inhaled steroids: Secondary | ICD-10-CM

## 2024-02-14 DIAGNOSIS — D631 Anemia in chronic kidney disease: Secondary | ICD-10-CM

## 2024-02-14 DIAGNOSIS — E785 Hyperlipidemia, unspecified: Secondary | ICD-10-CM

## 2024-02-14 LAB — CBC
HCT: 36 % — ABNORMAL LOW (ref 39.0–52.0)
Hemoglobin: 11 g/dL — ABNORMAL LOW (ref 13.0–17.0)
MCH: 22.7 pg — ABNORMAL LOW (ref 26.0–34.0)
MCHC: 30.6 g/dL (ref 30.0–36.0)
MCV: 74.2 fL — ABNORMAL LOW (ref 80.0–100.0)
Platelets: 220 K/uL (ref 150–400)
RBC: 4.85 MIL/uL (ref 4.22–5.81)
RDW: 16.6 % — ABNORMAL HIGH (ref 11.5–15.5)
WBC: 13.7 K/uL — ABNORMAL HIGH (ref 4.0–10.5)
nRBC: 0 % (ref 0.0–0.2)

## 2024-02-14 LAB — BASIC METABOLIC PANEL WITH GFR
Anion gap: 9 (ref 5–15)
BUN: 12 mg/dL (ref 8–23)
CO2: 28 mmol/L (ref 22–32)
Calcium: 9.4 mg/dL (ref 8.9–10.3)
Chloride: 97 mmol/L — ABNORMAL LOW (ref 98–111)
Creatinine, Ser: 1.17 mg/dL (ref 0.61–1.24)
GFR, Estimated: >60 mL/min (ref >60)
Glucose, Bld: 90 mg/dL (ref 70–99)
Potassium: 3.7 mmol/L (ref 3.5–5.1)
Sodium: 134 mmol/L — ABNORMAL LOW (ref 135–145)

## 2024-02-14 MED ORDER — AZITHROMYCIN 250 MG PO TABS: 500.0000 mg | ORAL_TABLET | ORAL | Status: DC

## 2024-02-14 MED ORDER — AZITHROMYCIN 250 MG PO TABS
500.0000 mg | ORAL_TABLET | Freq: Every day | ORAL | Status: AC
  Administered 2024-02-15: 500 mg via ORAL
  Filled 2024-02-14: qty 2

## 2024-02-14 NOTE — Plan of Care (Signed)

## 2024-02-14 NOTE — Progress Notes (Signed)
 SATURATION QUALIFICATIONS: (This note is used to comply with regulatory documentation for home oxygen )  Patient Saturations on 10L at Rest = 92%  Patient Saturations on 15L while Ambulating = 90%  Please briefly explain why patient needs home oxygen : With static sitting pt able to tolerate 10L with mobility rapid desaturation to 88% requiring 15L with limited gait to maintain >90%  [name] [name] P, PT Acute Rehabilitation Services Office: [phone]

## 2024-02-14 NOTE — Evaluation (Signed)
 Physical Therapy Evaluation Patient Details Name: [name] [name]. MRN: [id number] DOB: 11-May-1960 Today's Date: 02/14/2024  History of Present Illness  63 y/o male adm 02/13/24 with SOB, CAP and chronic respiratory failure. PMHx: COPD on home 10L, CKD, HTN, HLD, gout.  Clinical Impression  Pt pleasant, on bipap on arrival, transitioned to 10L HFNC via RN. Pt with desaturation on 10L at rest to 89% requiring increase to 15L for activity to maintain 90% with limited gait and HR max 123. Pt with good use of DME and decreased gait speed to modify activity. Pt reports daughters assisting with O2 tank management at home to maintain HFNC. Pt with decreased activity tolerance who will benefit from acute therapy but does not require post acute needs. Encouraged ambulation with staff and up to bathroom with O2 assist and monitoring.   HR 113-123 with activity      If plan is discharge home, recommend the following: Assistance with cooking/housework;A little help with bathing/dressing/bathroom;Assist for transportation   Can travel by private vehicle        Equipment Recommendations None recommended by PT  Recommendations for Other Services       Functional Status Assessment Patient has had a recent decline in their functional status and/or demonstrates limited ability to make significant improvements in function in a reasonable and predictable amount of time     Precautions / Restrictions Precautions Precautions: Fall;Other (comment) Recall of Precautions/Restrictions: Intact Precaution/Restrictions Comments: watch sats 10L rest, 15L gait      Mobility  Bed Mobility               General bed mobility comments: EOB on arrival and end of session    Transfers Overall transfer level: Independent                      Ambulation/Gait Ambulation/Gait assistance: Supervision Gait Distance (Feet): 90 Feet Assistive device: Rollator (4 wheels) Gait Pattern/deviations:  Step-through pattern, Decreased stride length, Trunk flexed, Shuffle   Gait velocity interpretation: <1.31 ft/sec, indicative of household ambulator   General Gait Details: slow cautious gait, flexed over armrests of rollator, propping on forearms, standing rest halfway with pt self regulating distance. 90% on 15L  Stairs            Wheelchair Mobility     Tilt Bed    Modified Rankin (Stroke Patients Only)       Balance Overall balance assessment: Needs assistance   Sitting balance-Leahy Scale: Good     Standing balance support: Bilateral upper extremity supported, During functional activity Standing balance-Leahy Scale: Fair Standing balance comment: can stand without support, rollator for gait                             Pertinent Vitals/Pain Pain Assessment Pain Assessment: No/denies pain    Home Living Family/patient expects to be discharged to:: Private residence Living Arrangements: Children Available Help at Discharge: Family;Available 24 hours/day Type of Home: House Home Access: Level entry       Home Layout: One level Home Equipment: Rollator (4 wheels);Cane - single point;Wheelchair - Careers Adviser (comment)      Prior Function Prior Level of Function : Needs assist             Mobility Comments: amb short household distances with rollator vs cane ADLs Comments: mod I basic ADLs. Daughters do cooking, education officer, environmental, and grocery shopping.     Extremity/Trunk Assessment  Upper Extremity Assessment Upper Extremity Assessment: Generalized weakness    Lower Extremity Assessment Lower Extremity Assessment: Generalized weakness    Cervical / Trunk Assessment Cervical / Trunk Assessment: Normal (preference for tripod posture)  Communication   Communication Communication: No apparent difficulties    Cognition Arousal: Alert Behavior During Therapy: WFL for tasks assessed/performed   PT - Cognitive impairments: No apparent  impairments                         Following commands: Intact       Cueing Cueing Techniques: Verbal cues     General Comments      Exercises     Assessment/Plan    PT Assessment Patient needs continued PT services  PT Problem List Decreased activity tolerance;Decreased mobility;Cardiopulmonary status limiting activity       PT Treatment Interventions DME instruction;Gait training;Functional mobility training;Patient/family education;Therapeutic activities;Therapeutic exercise    PT Goals (Current goals can be found in the Care Plan section)  Acute Rehab PT Goals Patient Stated Goal: return home PT Goal Formulation: With patient Time For Goal Achievement: 02/28/24 Potential to Achieve Goals: Fair    Frequency Min 1X/week     Co-evaluation               AM-PAC PT 6 Clicks Mobility  Outcome Measure Help needed turning from your back to your side while in a flat bed without using bedrails?: None Help needed moving from lying on your back to sitting on the side of a flat bed without using bedrails?: None Help needed moving to and from a bed to a chair (including a wheelchair)?: None Help needed standing up from a chair using your arms (e.g., wheelchair or bedside chair)?: A Little Help needed to walk in hospital room?: A Little Help needed climbing 3-5 steps with a railing? : A Little 6 Click Score: 21    End of Session Equipment Utilized During Treatment: Oxygen  Activity Tolerance: Patient tolerated treatment well Patient left: in bed;with call bell/phone within reach (EOB with MD end of session) Nurse Communication: Mobility status PT Visit Diagnosis: Other abnormalities of gait and mobility (R26.89);Difficulty in walking, not elsewhere classified (R26.2)    Time: 0722-0749 PT Time Calculation (min) (ACUTE ONLY): 27 min   Charges:   PT Evaluation $PT Eval Low Complexity: 1 Low PT Treatments $Gait Training: 8-22 mins PT General  Charges $$ ACUTE PT VISIT: 1 Visit         Lenoard SQUIBB, PT Acute Rehabilitation Services Office: [phone]   Yechezkel Fertig B Jeovany Huitron 02/14/2024, 11:00 AM

## 2024-02-14 NOTE — Progress Notes (Signed)
 RT placed pt on bipap per pt request while sleeping. RT informed pt when he wakes up to have RN call respiratory to place pt back on 10L HFNC [place]. RN also informed on pt on being on bipap.

## 2024-02-14 NOTE — Evaluation (Signed)
 Occupational Therapy Evaluation Patient Details Name: [name] [name]. MRN: [id number] DOB: 1960/05/25 Today's Date: 02/14/2024   History of Present Illness   63 y/o male adm 02/13/24 with SOB, CAP and chronic respiratory failure. PMHx: COPD on home 10L, CKD, HTN, HLD, gout.     Clinical Impressions Pt presents with decline in function and safety with ADLs and ADL mobility with significant impairment with activity tolerance/endurance with DOE with minimal activity. PTA pt lives with hsi daughter and other family members and was Ind with basic ADLs, bathes at sink, uses rollater. Pt's daughter assists with cooking, home mgt, grocery shopping and transportation. Pt sitting EOB upon arrival and is currently Sup-Mod I with ADLs and mobility, requiring increased time with muitple rest breaks, used urinal at EOB, O2 SATs drop with minimal exertion. Pt on 10L at rest at 92%-94%, dropping to 87% with minimal ADL activity. Able to recover to 92-94% with rest breaks. HR 112-117. Initiated energy conservation education, pt declined handouts stating that he has the info at home. OT will follow acutely to maximize level of function and safety     If plan is discharge home, recommend the following:   A little help with bathing/dressing/bathroom;Assistance with cooking/housework;Assist for transportation;Help with stairs or ramp for entrance     Functional Status Assessment   Patient has had a recent decline in their functional status and demonstrates the ability to make significant improvements in function in a reasonable and predictable amount of time.     Equipment Recommendations   Tub/shower bench;Other (comment) (reacher, LH bath sponge)     Recommendations for Other Services         Precautions/Restrictions   Precautions Precautions: Fall;Other (comment) Recall of Precautions/Restrictions: Intact Precaution/Restrictions Comments: watch sats 10L rest, 15L gait Restrictions Weight  Bearing Restrictions Per Provider Order: No     Mobility Bed Mobility               General bed mobility comments: pt sititng EOB upon arrival    Transfers Overall transfer level: Modified independent                 General transfer comment: Sup-Mod I, pt initially reluctant to mobilize      Balance Overall balance assessment: Needs assistance Sitting-balance support: No upper extremity supported, Feet supported Sitting balance-Leahy Scale: Good     Standing balance support: Bilateral upper extremity supported, During functional activity Standing balance-Leahy Scale: Fair                             ADL either performed or assessed with clinical judgement   ADL                                         General ADL Comments: Sup-Mod I, increased time with muitple rest breaks, used urinal at EOB, O2 SATs drop with minimal exertion. Pt on 10L at rest at 92%-94%, dropping to 87% with minimal ADL activity. Able to recover to 92-94% with rest breaks. HR 112-117. Initiated energy conservation education, pt declined handouts stating that he has the info at home     Vision Baseline Vision/History: 1 Wears glasses Ability to See in Adequate Light: 0 Adequate Patient Visual Report: No change from baseline       Perception         Praxis  Pertinent Vitals/Pain Pain Assessment Pain Assessment: No/denies pain     Extremity/Trunk Assessment Upper Extremity Assessment Upper Extremity Assessment: Generalized weakness   Lower Extremity Assessment Lower Extremity Assessment: Defer to PT evaluation   Cervical / Trunk Assessment Cervical / Trunk Assessment: Normal   Communication Communication Communication: No apparent difficulties   Cognition Arousal: Alert Behavior During Therapy: WFL for tasks assessed/performed, Anxious Cognition: No apparent impairments                               Following commands:  Intact       Cueing  General Comments   Cueing Techniques: Verbal cues      Exercises     Shoulder Instructions      Home Living Family/patient expects to be discharged to:: Private residence Living Arrangements: Children Available Help at Discharge: Family;Available 24 hours/day Type of Home: House Home Access: Level entry     Home Layout: One level     Bathroom Shower/Tub: Chief Strategy Officer: Standard Bathroom Accessibility: Yes   Home Equipment: Rollator (4 wheels);Cane - single point;Wheelchair - manual;Other (comment)   Additional Comments: Pt on 10L O2 at baseline.      Prior Functioning/Environment Prior Level of Function : Needs assist             Mobility Comments: Uses rollater ADLs Comments: Ind with basic ADLs, bathes at sink. Daughter does cooking, driving, cleaning and shopping    OT Problem List: Cardiopulmonary status limiting activity;Decreased activity tolerance;Decreased knowledge of use of DME or AE   OT Treatment/Interventions: Self-care/ADL training;Patient/family education;Therapeutic activities;Energy conservation;DME and/or AE instruction      OT Goals(Current goals can be found in the care plan section)   Acute Rehab OT Goals Patient Stated Goal: breathe better, go home OT Goal Formulation: With patient Time For Goal Achievement: 02/28/24 Potential to Achieve Goals: Good ADL Goals Pt Will Perform Lower Body Bathing: with set-up;with modified independence;with adaptive equipment;with caregiver independent in assisting Pt Will Perform Lower Body Dressing: with set-up;with modified independence;with adaptive equipment;sit to/from stand;with caregiver independent in assisting Pt Will Transfer to Toilet: with modified independence;ambulating Additional ADL Goal #1: Pt will verbalize and demo 3 energy conservation strategies for ADLs and ADL mobility   OT Frequency:  Min 2X/week    Co-evaluation               AM-PAC OT 6 Clicks Daily Activity     Outcome Measure Help from another person eating meals?: None Help from another person taking care of personal grooming?: A Little Help from another person toileting, which includes using toliet, bedpan, or urinal?: A Little Help from another person bathing (including washing, rinsing, drying)?: A Little Help from another person to put on and taking off regular upper body clothing?: A Little Help from another person to put on and taking off regular lower body clothing?: A Little 6 Click Score: 19   End of Session Equipment Utilized During Treatment: Gait belt  Activity Tolerance: Patient limited by fatigue;Other (comment) (DOE) Patient left: in chair;with call bell/phone within reach;Other (comment) (sitting EOB)  OT Visit Diagnosis: Other abnormalities of gait and mobility (R26.89);Muscle weakness (generalized) (M62.81)                Time: 8953-8888 OT Time Calculation (min): 25 min Charges:  OT General Charges $OT Visit: 1 Visit OT Evaluation $OT Eval Low Complexity: 1 Low OT Treatments $Therapeutic Activity:  8-22 mins    Jacques Aquas [facility] 02/14/2024, 12:14 PM

## 2024-02-14 NOTE — Progress Notes (Signed)
 HD#1 SUBJECTIVE:  Patient Summary: [name] [name]. is a 63 y.o. 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   Overnight Events: No acute events overnight  Interim History:  11/6 There were no acute events overnight. Vital signs notable for elevated HR most of the day in the 110s, and RR 20s-30s (when off BiPAP), and pt was not comfortable with saturations lower than 96%. Morning labs notable for WBC 13.7 (from 17.2), and otherwise nonconcerning CBC/BMP.   On interview, pt reports that while his numbers are similar to what they are at home (10L baseline, 15L with ambulation) that he is still feeling worse than he does at home. Specifically, he says that it feels more difficult to get air into his lungs. Says that he prefers IV antibiotics.  OBJECTIVE:  Vital Signs: Vitals:   02/13/24 2208 02/13/24 2359 02/14/24 0107 02/14/24 0400  BP:  112/78  105/68  Pulse: (!) 102 84  84  Resp:  19  20  Temp:  98 F (36.7 C)  98.6 F (37 C)  TempSrc:  Oral  Oral  SpO2: 100% 100% 100% 100%  Weight:      Height:       Supplemental O2: Nasal Cannula SpO2: 100 % O2 Flow Rate (L/min): 9 L/min FiO2 (%): 50 %  Filed Weights   02/13/24 0305 02/13/24 1700  Weight: 81 kg 76.1 kg    Intake/Output Summary (Last 24 hours) at 02/14/2024 0614 Last data filed at 02/14/2024 0500 Gross per 24 hour  Intake 640.52 ml  Output 1000 ml  Net -359.48 ml   Net IO Since Admission: -759.48 mL [02/14/24 0614]  Physical Exam: Physical Exam Vitals reviewed.  Constitutional:      Appearance: He is ill-appearing.  Pulmonary:     Effort: Respiratory distress present.     Breath sounds: Decreased air movement present.     Comments: Tripoding, leaning forward to breath better Abdominal:     Palpations: Abdomen is soft.     Tenderness: There is no abdominal  tenderness.  Musculoskeletal:     Right lower leg: No edema.     Left lower leg: No edema.  Skin:    General: Skin is warm and dry.  Neurological:     Mental Status: He is alert.    Patient Lines/Drains/Airways Status     Active Line/Drains/Airways     Name Placement date Placement time Site Days   Peripheral IV 02/13/24 20 G Left Antecubital 02/13/24  0258  Antecubital  less than 1            Pertinent labs and imaging:      Latest Ref Rng & Units 02/14/2024    3:02 AM 02/13/2024    3:07 AM 01/22/2024    4:07 AM  CBC  WBC 4.0 - 10.5 K/uL 13.7  17.2  20.5   Hemoglobin 13.0 - 17.0 g/dL 88.9  88.4  88.8   Hematocrit 39.0 - 52.0 % 36.0  37.6  36.1   Platelets 150 - 400 K/uL 220  226  521        Latest Ref Rng & Units 02/14/2024    3:02 AM 02/13/2024    3:07 AM 01/20/2024    3:42 AM  CMP  Glucose 70 - 99 mg/dL 90  94  889   BUN 8 -  23 mg/dL 12  11  12    Creatinine 0.61 - 1.24 mg/dL 8.82  9.03  8.97   Sodium 135 - 145 mmol/L 134  135  136   Potassium 3.5 - 5.1 mmol/L 3.7  3.5  4.6   Chloride 98 - 111 mmol/L 97  95  98   CO2 22 - 32 mmol/L 28  29  30    Calcium  8.9 - 10.3 mg/dL 9.4  9.9  9.3     CT Angio Chest Pulmonary Embolism (PE) W or WO Contrast Result Date: 02/13/2024 CLINICAL DATA:  Chest pain worse on right side. Possible pulmonary embolism. EXAM: CT ANGIOGRAPHY CHEST WITH CONTRAST TECHNIQUE: Multidetector CT imaging of the chest was performed using the standard protocol during bolus administration of intravenous contrast. Multiplanar CT image reconstructions and MIPs were obtained to evaluate the vascular anatomy. RADIATION DOSE REDUCTION: This exam was performed according to the departmental dose-optimization program which includes automated exposure control, adjustment of the mA and/or kV according to patient size and/or use of iterative reconstruction technique. CONTRAST:  75mL OMNIPAQUE  IOHEXOL  350 MG/ML SOLN COMPARISON:  11/01/2023, 07/31/2023 and 10/30/2018  FINDINGS: Cardiovascular: Right is within normal in size. Mild calcified plaque over the left anterior descending and right coronary arteries. Thoracic aorta is normal in caliber. Mild calcified plaque over the descending thoracic aorta. Pulmonary arterial system is well opacified without evidence of emboli. Remaining vascular structures are unremarkable. Mediastinum/Nodes: Cluster of lymph nodes over the AP window with the largest measuring approximately 1 cm by short axis without significant change from April 2025, although increased in size compared to 2020. No hilar adenopathy. Remaining mediastinal structures are unremarkable. Lungs/Pleura: Lungs are adequately inflated with moderate centrilobular emphysematous disease. There is a spiculated nodular opacity over the posterior right upper lobe (image 47) without significant change compared to 07/31/2023, although new since 2020. Mild scarring/atelectatic change over the medial inferior aspect of the right middle lobe and also posterior basilar right lower lobe which is stable. Subtle interstitial prominence over the periphery of the right lower lobe which is stable. Waxing and waning process over the lingula has this irregular focal airspace density decreased in size from April 2025 to July 2025 as there is now a larger more confluent irregular airspace density on the current exam extending to the pleura. Mild increased subpleural reticular density over the lingula with hazy opacification of the inferior lingula. No effusion. Remainder the lungs are unchanged. Airways unremarkable. Upper Abdomen: Mild calcified plaque over the abdominal aorta. Cholesterol gallstones. No acute findings. Musculoskeletal: No focal abnormality. Review of the MIP images confirms the above findings. IMPRESSION: 1. No evidence of pulmonary embolism. 2. Waxing and waning process over the lingula where there is now a larger more confluent irregular airspace density extending to the  pleura. Mild increased subpleural reticular density over the lingula with hazy opacification of the inferior lingula. Findings may be due to an acute on chronic process likely of infectious or inflammatory nature. Recommend follow-up CT 6 weeks. 3. Spiculated nodular opacity over the posterior right upper lobe without significant change compared to 07/31/2023, although new since 2020. Recommend referral to [facility]) for consideration of PET-CT versus tissue sampling. 4. Cluster of lymph nodes over the AP window with the largest measuring approximately 1 cm by short axis without significant change from April 2025, although increased in size compared to 2020. 5. Moderate centrilobular emphysematous disease. 6. Cholelithiasis. 7. Aortic atherosclerosis. Atherosclerotic coronary artery disease. Aortic Atherosclerosis (ICD10-I70.0) and Emphysema (  ICD10-J43.9). Electronically Signed   By: Toribio Agreste M.D.   On: 02/13/2024 11:35   DG Chest Port 1 View Result Date: 02/13/2024 EXAM: 1 VIEW(S) XRAY OF THE CHEST 02/13/2024 03:23:00 AM COMPARISON: 01/19/2024 CLINICAL HISTORY: SOB, COPD FINDINGS: LUNGS AND PLEURA: Hyperlucent lungs consistent with emphysema. New left mid lung consolidation. Improved right lower lung zone airspace disease. Less improved left lower lung zone airspace disease. No pulmonary edema. No pleural effusion. No pneumothorax. HEART AND MEDIASTINUM: No acute abnormality of the cardiac and mediastinal silhouettes. BONES AND SOFT TISSUES: No acute osseous abnormality. IMPRESSION: 1. Emphysema. New left mid lung consolidation, suggesting acute infectious exacerbation. 2. Elsewhere Improved lower lung ventilation since October. No definite pleural effusion Electronically signed by: Helayne Hurst MD 02/13/2024 03:50 AM EST RP Workstation: [id number]    ASSESSMENT/PLAN:  Assessment: Principal Problem:   CAP (community acquired pneumonia) Active Problems:   Gout    Hypertension   End stage COPD (HCC)   Chronic hypoxic respiratory failure (HCC)   Acute on chronic hypoxic respiratory failure (HCC)   COPD with acute lower respiratory infection (HCC)  [name] [name]. is a 63 year old man with end-stage COPD on 10 L O?, HTN, CKD, and gout, presenting with new left mid-lung consolidation and mild positional left back discomfort, consistent with community-acquired pneumonia and chronic hypoxic respiratory failure, currently stable on baseline oxygen  and improving on Augmentin, Azithromycin , prednisone , and supportive care (Duonebs, inhalers, BiPAP, lidocaine  patch).   #Community Acquired Pneumonia #Chronic Hypoxic Respiratory Failure / End-Stage COPD Recent hospitalization (10/11-10/15) for COPD exacerbation with RLL pneumonia; now returns with new left mid-lung consolidation and leukocytosis (WBC 17 K). He denies fever, cough, or sputum change, but positional left back discomfort and imaging are consistent with acute infectious process. Baseline 10 L O2 at rest, 15 L with ambulation; currently comfortable on home settings. No wheezing, sputum change, or acute distress. Mild leukocytosis likely steroid-related. Initially treated with broad-spectrum coverage (vanc/cefepime ) and quickly de-escalated to Augmentin and Azithromycin  given stable condition and no risk factors for Pseudomonas/MRSA. CTA negative for PE, showed spiculated nodular opacity which was scheduled to receive a PET scan 11/6, which was cancelled and will likely need to reschedule.  Broad respiratory panel negative. MRSA negative. Troponin negative. BNP 37.1  Plan - Continue Augmentin 875 mg BID + Azithromycin  500 mg daily (Final dose, 5 days of antibiotics, will be 11/8),  - Pt is on chronic Azithromycin  500mg  MWF, continue this after abx course. - Encourage incentive spirometry and ambulation - Continue home Symbicort  (Breo ellipta ), Montelukast , and Roflumilast  - Prednisone  40 mg daily (on  10mg  at home as baseline) - Duoneb q4h PRN - continue nightly BiPAP - Wean to baseline O2 as tolerated - Continue pulmonary hygiene and rehab efforts - Has appointment scheduled with outpatient pulmonology for November 13th.  #Chest/Back Discomfort Intermittent left posterior chest discomfort, mild and positional, worse lying flat, improved sitting up. No ischemic features; reproducible on palpation. Could represent musculoskeletal strain or pleuritic irritation from pneumonia. Lidocaine  patch has been helpful. Plan - Lidocaine  patch daily to affected area - Encourage repositioning and gentle stretching - Monitor for any new pleuritic or cardiac features  #Hypertension Stable pressures (SBP 140s) on Amlodipine  10 mg daily. Plan - Continue Amlodipine  10 mg daily - Monitor BP daily while inpatient  #Gout Chronic, well controlled on Allopurinol  100 mg daily. No acute flare. Allergy to Colchicine  (Hives). Plan - Continue Allopurinol  100 mg daily  #Microcytic Anemia Stable mild anemia (Hgb 11.5, MCV 75), consistent  with chronic baseline; asymptomatic. Plan - Trend CBC - Outpatient iron  studies if persistent  #CKD Stage II Stable renal function (Cr 0.96, eGFR > 60). Plan - Avoid nephrotoxins  #Hyperlipidemia On Zetia  10 mg daily, intolerant to statins. Plan - Continue Zetia  10 mg daily  Best Practice: Diet: Regular diet VTE: rivaroxaban  (XARELTO ) tablet 10 mg Start: 02/13/24 1000 Code: DNR/DNI, but is ok with shocks (not compressions)  Disposition planning: Therapy Recs: Pending DISPO: Anticipated discharge in 2-3 days location pending. Pending clinical improvement. Has outpt pulmonology appointment scheduled for November 13th.   Signature:  Penne Lera [facility]  6:14 AM, 02/14/2024  On Call pager [phone]

## 2024-02-14 NOTE — TOC Initial Note (Signed)
 Transition of Care (TOC) - Initial/Assessment Note    Patient Details  Name: [name] [name]. MRN: [id number] Date of Birth: 02/01/1961  Transition of Care [facility]) CM/SW Contact:    Lauraine FORBES Saa, LCSWA Phone Number: 02/14/2024, 2:29 PM  Clinical Narrative:                  2:30 PM Per chart review, patient resides at home with child(ren). Patient has a PCP and insurance. Patient does not have SNF history. Patient has HH history with [facility]. Patient has DME (Wheelchair, wheelchair cushion, BSC, oxygen , CPAP, NIV, rolling walker, cane, oxymizer) history with [employer] and [employer]. Patient's preferred pharmacy's are [facility], [facility], [facility] [address], [facility] [place], and [facility]. PT did not have disposition recommendations. OT recommended DME (tub/shower bench, reacher, LH bath sponge). TOC will continue to follow.  Expected Discharge Plan: Home/Self Care Barriers to Discharge: Continued Medical Work up   Patient Goals and CMS Choice            Expected Discharge Plan and Services       Living arrangements for the past 2 months: Single Family Home                                      Prior Living Arrangements/Services Living arrangements for the past 2 months: Single Family Home Lives with:: Adult Children Patient language and need for interpreter reviewed:: Yes            Current home services: DME Criminal Activity/Legal Involvement Pertinent to Current Situation/Hospitalization: No - Comment as needed  Activities of Daily Living   ADL Screening (condition at time of admission) Independently performs ADLs?: Yes (appropriate for developmental age)  Permission Sought/Granted Permission sought to share information with : Family Supports Permission granted to share information with : No (Contact information on  chart)  Share Information with NAME: Fidel Caggiano     Permission granted to share info w Relationship: Daughter  Permission granted to share info w Contact Information: [phone]  Emotional Assessment         Alcohol / Substance Use: Not Applicable Psych Involvement: No (comment)  Admission diagnosis:  CAP (community acquired pneumonia) [J18.9] Pneumonia of left lower lobe due to infectious organism [J18.9] Acute on chronic hypoxic respiratory failure (HCC) [J96.21] Patient Active Problem List   Diagnosis Date Noted   CAP (community acquired pneumonia) 02/13/2024   COPD with acute lower respiratory infection (HCC) 12/11/2023   COPD with acute exacerbation (HCC) 09/09/2023   Pulmonary nodules 08/01/2023   Respiratory distress 07/31/2023   Viral respiratory infection 06/14/2023   Acute on chronic hypoxic respiratory failure (HCC) 06/13/2023   Acute on chronic respiratory failure with hypoxia (HCC) 05/10/2022   COVID-19 virus infection 05/10/2022   Statin intolerance 04/20/2021   Hyperlipidemia 04/19/2021   Acute hypoxemic respiratory failure due to COVID-19 (HCC) 12/04/2020   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   End stage COPD (HCC) 10/30/2016   Chronic hypoxic respiratory failure (HCC) 10/30/2016   Class 2 obesity 09/17/2015   Bilateral carpal tunnel syndrome 09/09/2015   Ulnar neuropathy of both upper extremities 09/09/2015   Numbness of left hand 01/21/2015   Abnormal EKG 10/27/2014   Moderate to severe pulmonary hypertension (HCC) 12/14/2013   COPD exacerbation (HCC) 12/11/2013   Cocaine abuse in remission (  HCC) 12/11/2013   Hypertension 12/11/2013   COPD bronchitis 08/08/2012   Gout 08/08/2012   CKD (chronic kidney disease) stage 2, GFR 60-89 ml/min 08/03/2012   Tobacco abuse, in remission 07/29/2012   Pneumonia due to infectious organism 07/28/2012   PCP:  Delbert Clam, MD Pharmacy:   [facility] [address], [address] [place] [address] Phone: [phone] Fax: [phone]  [facility] [address] [place]), [place] - [address] [place] ([place]) [place] [address] Phone: [phone] Fax: [phone]  [facility] [address] [place] [address] Phone: [phone] Fax: [phone]  [facility] [place], [place] - [address] [place] [address] Phone: [phone] Fax: [phone]  [place] [facility] [address], [address] [place] [address] Phone: [phone] Fax: [phone]     Social Drivers of Health (SDOH) Social History: SDOH Screenings   Food Insecurity: No Food Insecurity (02/13/2024)  Housing: Low Risk  (02/13/2024)  Transportation Needs: No Transportation Needs (02/13/2024)  Recent Concern: Transportation Needs - Unmet Transportation Needs (02/08/2024)  Utilities: Not At Risk (02/13/2024)  Depression (PHQ2-9): Low Risk  (02/08/2024)  Social Connections: Moderately Isolated (02/13/2024)  Tobacco Use: Medium Risk (02/13/2024)   SDOH Interventions:     Readmission Risk Interventions    12/18/2023   12:11 PM 11/02/2023   10:59 AM 09/14/2023    1:09 PM  Readmission Risk Prevention Plan  Post Dischage Appt Complete  Complete  Medication Screening Complete  Complete  Transportation Screening Complete Complete Complete  PCP or Specialist Appt within 5-7 Days  Complete   Home Care Screening  Complete   Medication Review (RN CM)  Complete

## 2024-02-15 DIAGNOSIS — J441 Chronic obstructive pulmonary disease with (acute) exacerbation: Secondary | ICD-10-CM | POA: Diagnosis not present

## 2024-02-15 DIAGNOSIS — J189 Pneumonia, unspecified organism: Secondary | ICD-10-CM | POA: Diagnosis not present

## 2024-02-15 LAB — CBC
HCT: 35.1 % — ABNORMAL LOW (ref 39.0–52.0)
Hemoglobin: 11 g/dL — ABNORMAL LOW (ref 13.0–17.0)
MCH: 23.1 pg — ABNORMAL LOW (ref 26.0–34.0)
MCHC: 31.3 g/dL (ref 30.0–36.0)
MCV: 73.6 fL — ABNORMAL LOW (ref 80.0–100.0)
Platelets: 226 K/uL (ref 150–400)
RBC: 4.77 MIL/uL (ref 4.22–5.81)
RDW: 16.4 % — ABNORMAL HIGH (ref 11.5–15.5)
WBC: 13.4 K/uL — ABNORMAL HIGH (ref 4.0–10.5)
nRBC: 0 % (ref 0.0–0.2)

## 2024-02-15 MED ORDER — FLUTICASONE FUROATE-VILANTEROL 200-25 MCG/ACT IN AEPB
1.0000 | INHALATION_SPRAY | Freq: Every day | RESPIRATORY_TRACT | Status: DC
Start: 2024-02-15 — End: 2024-02-15

## 2024-02-15 MED ORDER — IPRATROPIUM-ALBUTEROL 0.5-2.5 (3) MG/3ML IN SOLN
3.0000 mL | Freq: Four times a day (QID) | RESPIRATORY_TRACT | Status: DC
  Administered 2024-02-15 – 2024-02-17 (×9): 3 mL via RESPIRATORY_TRACT
  Filled 2024-02-15 (×9): qty 3

## 2024-02-15 MED ORDER — FLUTICASONE FUROATE-VILANTEROL 200-25 MCG/ACT IN AEPB
1.0000 | INHALATION_SPRAY | RESPIRATORY_TRACT | Status: DC
  Administered 2024-02-15 – 2024-02-16 (×2): 1 via RESPIRATORY_TRACT
  Filled 2024-02-15 (×2): qty 28

## 2024-02-15 MED ORDER — UMECLIDINIUM BROMIDE 62.5 MCG/ACT IN AEPB
1.0000 | INHALATION_SPRAY | RESPIRATORY_TRACT | Status: DC
  Administered 2024-02-15 – 2024-02-16 (×2): 1 via RESPIRATORY_TRACT
  Filled 2024-02-15: qty 7

## 2024-02-15 MED ORDER — UMECLIDINIUM BROMIDE 62.5 MCG/ACT IN AEPB
1.0000 | INHALATION_SPRAY | Freq: Every day | RESPIRATORY_TRACT | Status: DC
  Filled 2024-02-15: qty 7

## 2024-02-15 NOTE — Progress Notes (Signed)
 Occupational Therapy Treatment Patient Details Name: [name] [name]. MRN: [id number] DOB: [birth year] Today's Date: 02/15/2024   History of present illness 63 y/o male adm 02/13/24 with SOB, CAP and chronic respiratory failure. PMHx: COPD on home 10L, CKD, HTN, HLD, gout.   OT comments  Pt. Seen for skilled OT treatment session.  Pt. Able to complete peri care after BM with S sit/stand.  Stand pivot back to seated eob with S.  Pt. With good utilization of energy conservation and breathing strategies.  Initiates rest breaks appropriately without cues.  Cont. With acute OT POC.        If plan is discharge home, recommend the following:  A little help with bathing/dressing/bathroom;Assistance with cooking/housework;Assist for transportation;Help with stairs or ramp for entrance   Equipment Recommendations  Tub/shower bench;Other (comment)    Recommendations for Other Services      Precautions / Restrictions Precautions Precautions: Fall;Other (comment) Precaution/Restrictions Comments: watch sats 10L rest, 15L gait       Mobility Bed Mobility               General bed mobility comments: sitting on 3n1 at beg. of session then eob with rn present at end of session    Transfers Overall transfer level: Needs assistance Equipment used: None Transfers: Sit to/from Stand, Bed to chair/wheelchair/BSC Sit to Stand: Supervision Stand pivot transfers: Supervision         General transfer comment: cues for safer pivot to closest distance/side. pt. aware but having to pivot opposite due to tubing     Balance                                           ADL either performed or assessed with clinical judgement   ADL Overall ADL's : Needs assistance/impaired                         Toilet Transfer: Electrical Engineer Details (indicate cue type and reason): reviewed transferring to closest side during pivot, but pt. aware he  transferred the way he did secondary to tubing, but states otherwise he transfers to the closest side Toileting- Architect and Hygiene: Sit to/from stand;Set up Toileting - Clothing Manipulation Details (indicate cue type and reason): pt. able to clean buttocks partial sit/stand from bsc and use of arm rests for sit/stand     Functional mobility during ADLs: Supervision/safety      Extremity/Trunk Assessment              Vision       Perception     Praxis     Communication Communication Communication: No apparent difficulties   Cognition Arousal: Alert Behavior During Therapy: WFL for tasks assessed/performed Cognition: No apparent impairments                               Following commands: Intact        Cueing   Cueing Techniques: Verbal cues  Exercises      Shoulder Instructions       General Comments      Pertinent Vitals/ Pain       Pain Assessment Pain Assessment: No/denies pain  Home Living  Prior Functioning/Environment              Frequency  Min 2X/week        Progress Toward Goals  OT Goals(current goals can now be found in the care plan section)  Progress towards OT goals: Progressing toward goals     Plan      Co-evaluation                 AM-PAC OT 6 Clicks Daily Activity     Outcome Measure   Help from another person eating meals?: None Help from another person taking care of personal grooming?: A Little Help from another person toileting, which includes using toliet, bedpan, or urinal?: A Little Help from another person bathing (including washing, rinsing, drying)?: A Little Help from another person to put on and taking off regular upper body clothing?: A Little Help from another person to put on and taking off regular lower body clothing?: A Little 6 Click Score: 19    End of Session    OT Visit Diagnosis: Other  abnormalities of gait and mobility (R26.89);Muscle weakness (generalized) (M62.81)   Activity Tolerance Patient tolerated treatment well   Patient Left Other (comment) (seated eob with rn present)   Nurse Communication Other (comment) (pt. requesting to be put on nasal canula o2 once seated eob after toileting and wanting to know if he had imodium  this morning or not. rn present at end of session assisting him with these items)        Time: 1131-1148 OT Time Calculation (min): 17 min  Charges: OT General Charges $OT Visit: 1 Visit OT Treatments $Self Care/Home Management : 8-22 mins  Randall, COTA/L Acute Rehabilitation [phone]   CHRISTELLA Nest Lorraine-COTA/L  02/15/2024, 12:28 PM

## 2024-02-15 NOTE — Plan of Care (Signed)

## 2024-02-15 NOTE — Progress Notes (Signed)
 Mobility Specialist Progress Note:    02/15/24 1100  Mobility  Activity Dangled on edge of bed;Stood at bedside (Ankle Pumps, Leg Ext)  Level of Assistance Standby assist, set-up cues, supervision of patient - no hands on  Assistive Device None  Range of Motion/Exercises Left leg;Right leg  Activity Response Tolerated fair  Mobility Referral Yes  Mobility visit 1 Mobility  Mobility Specialist Start Time (ACUTE ONLY) 1100  Mobility Specialist Stop Time (ACUTE ONLY) 1111  Mobility Specialist Time Calculation (min) (ACUTE ONLY) 11 min   Received pt sitting on EOB refusing ambulating but agreeable to some exercise. Pt c/o fatigue and fear of SOB w/ activity. Pt able to move and perform exercises well w/ no issues. Left pt EOB w/ all needs met.   Venetia Keel Mobility Specialist Please Neurosurgeon or Rehab Office at [phone]

## 2024-02-15 NOTE — Progress Notes (Signed)
 HD#2 SUBJECTIVE:  Patient Summary: [name] Jolley. is a 63 y.o. 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   Interim History:  11/7: There were no acute events overnight. Vital signs have been stable, and morning labs are non-concerning. PT/OT recommend discharge to home when medically cleared. Patient reports that he is feeling a little better today, but also that during his last hospitalization that he did significantly better on Incruse Ellipta  and Breo Ellipta . He is hopeful to make a full recovery, and we emphasized that there is a high likelihood that he may not.   OBJECTIVE:  Vital Signs: Vitals:   02/15/24 0330 02/15/24 0804 02/15/24 0837 02/15/24 1036  BP: 126/88 117/71  129/71  Pulse: 84 92  88  Resp: 18 20 20 20   Temp: 98.5 F (36.9 C) 98.6 F (37 C)  98.3 F (36.8 C)  TempSrc: Axillary Oral  Oral  SpO2: 97% 100%  98%  Weight:      Height:       Supplemental O2: Nasal Cannula SpO2: 98 % O2 Flow Rate (L/min): 10 L/min FiO2 (%): 50 %  Filed Weights   02/13/24 0305 02/13/24 1700  Weight: 81 kg 76.1 kg    Intake/Output Summary (Last 24 hours) at 02/15/2024 1138 Last data filed at 02/15/2024 1035 Gross per 24 hour  Intake 957 ml  Output 1175 ml  Net -218 ml   Net IO Since Admission: -997.48 mL [02/15/24 1138]  Physical Exam: Physical Exam Vitals reviewed.  Constitutional:      Appearance: He is ill-appearing.  Pulmonary:     Effort: Respiratory distress present.     Breath sounds: Decreased air movement present.     Comments: Tripoding, leaning forward to breath better Abdominal:     Palpations: Abdomen is soft.     Tenderness: There is no abdominal tenderness.  Musculoskeletal:     Right lower leg: No edema.     Left lower leg: No edema.  Skin:    General: Skin is warm and dry.  Neurological:      Mental Status: He is alert.    Patient Lines/Drains/Airways Status     Active Line/Drains/Airways     Name Placement date Placement time Site Days   Peripheral IV 02/13/24 20 G Left Antecubital 02/13/24  0258  Antecubital  less than 1            Pertinent labs and imaging:      Latest Ref Rng & Units 02/15/2024    3:17 AM 02/14/2024    3:02 AM 02/13/2024    3:07 AM  CBC  WBC 4.0 - 10.5 K/uL 13.4  13.7  17.2   Hemoglobin 13.0 - 17.0 g/dL 88.9  88.9  88.4   Hematocrit 39.0 - 52.0 % 35.1  36.0  37.6   Platelets 150 - 400 K/uL 226  220  226        Latest Ref Rng & Units 02/14/2024    3:02 AM 02/13/2024    3:07 AM 01/20/2024    3:42 AM  CMP  Glucose 70 - 99 mg/dL 90  94  889   BUN 8 - 23 mg/dL 12  11  12    Creatinine 0.61 - 1.24 mg/dL 8.82  9.03  8.97   Sodium 135 - 145 mmol/L 134  135  136  Potassium 3.5 - 5.1 mmol/L 3.7  3.5  4.6   Chloride 98 - 111 mmol/L 97  95  98   CO2 22 - 32 mmol/L 28  29  30    Calcium  8.9 - 10.3 mg/dL 9.4  9.9  9.3     CT Angio Chest Pulmonary Embolism (PE) W or WO Contrast Result Date: 02/13/2024 CLINICAL DATA:  Chest pain worse on right side. Possible pulmonary embolism. EXAM: CT ANGIOGRAPHY CHEST WITH CONTRAST TECHNIQUE: Multidetector CT imaging of the chest was performed using the standard protocol during bolus administration of intravenous contrast. Multiplanar CT image reconstructions and MIPs were obtained to evaluate the vascular anatomy. RADIATION DOSE REDUCTION: This exam was performed according to the departmental dose-optimization program which includes automated exposure control, adjustment of the mA and/or kV according to patient size and/or use of iterative reconstruction technique. CONTRAST:  75mL OMNIPAQUE  IOHEXOL  350 MG/ML SOLN COMPARISON:  11/01/2023, 07/31/2023 and 10/30/2018 FINDINGS: Cardiovascular: Right is within normal in size. Mild calcified plaque over the left anterior descending and right coronary arteries. Thoracic aorta is  normal in caliber. Mild calcified plaque over the descending thoracic aorta. Pulmonary arterial system is well opacified without evidence of emboli. Remaining vascular structures are unremarkable. Mediastinum/Nodes: Cluster of lymph nodes over the AP window with the largest measuring approximately 1 cm by short axis without significant change from April 2025, although increased in size compared to 2020. No hilar adenopathy. Remaining mediastinal structures are unremarkable. Lungs/Pleura: Lungs are adequately inflated with moderate centrilobular emphysematous disease. There is a spiculated nodular opacity over the posterior right upper lobe (image 47) without significant change compared to 07/31/2023, although new since 2020. Mild scarring/atelectatic change over the medial inferior aspect of the right middle lobe and also posterior basilar right lower lobe which is stable. Subtle interstitial prominence over the periphery of the right lower lobe which is stable. Waxing and waning process over the lingula has this irregular focal airspace density decreased in size from April 2025 to July 2025 as there is now a larger more confluent irregular airspace density on the current exam extending to the pleura. Mild increased subpleural reticular density over the lingula with hazy opacification of the inferior lingula. No effusion. Remainder the lungs are unchanged. Airways unremarkable. Upper Abdomen: Mild calcified plaque over the abdominal aorta. Cholesterol gallstones. No acute findings. Musculoskeletal: No focal abnormality. Review of the MIP images confirms the above findings. IMPRESSION: 1. No evidence of pulmonary embolism. 2. Waxing and waning process over the lingula where there is now a larger more confluent irregular airspace density extending to the pleura. Mild increased subpleural reticular density over the lingula with hazy opacification of the inferior lingula. Findings may be due to an acute on chronic  process likely of infectious or inflammatory nature. Recommend follow-up CT 6 weeks. 3. Spiculated nodular opacity over the posterior right upper lobe without significant change compared to 07/31/2023, although new since 2020. Recommend referral to [facility]) for consideration of PET-CT versus tissue sampling. 4. Cluster of lymph nodes over the AP window with the largest measuring approximately 1 cm by short axis without significant change from April 2025, although increased in size compared to 2020. 5. Moderate centrilobular emphysematous disease. 6. Cholelithiasis. 7. Aortic atherosclerosis. Atherosclerotic coronary artery disease. Aortic Atherosclerosis (ICD10-I70.0) and Emphysema (ICD10-J43.9). Electronically Signed   By: Toribio Agreste M.D.   On: 02/13/2024 11:35   DG Chest Port 1 View Result Date: 02/13/2024 EXAM: 1 VIEW(S) XRAY OF THE CHEST 02/13/2024  03:23:00 AM COMPARISON: 01/19/2024 CLINICAL HISTORY: SOB, COPD FINDINGS: LUNGS AND PLEURA: Hyperlucent lungs consistent with emphysema. New left mid lung consolidation. Improved right lower lung zone airspace disease. Less improved left lower lung zone airspace disease. No pulmonary edema. No pleural effusion. No pneumothorax. HEART AND MEDIASTINUM: No acute abnormality of the cardiac and mediastinal silhouettes. BONES AND SOFT TISSUES: No acute osseous abnormality. IMPRESSION: 1. Emphysema. New left mid lung consolidation, suggesting acute infectious exacerbation. 2. Elsewhere Improved lower lung ventilation since October. No definite pleural effusion Electronically signed by: Helayne Hurst MD 02/13/2024 03:50 AM EST RP Workstation: [id number]    ASSESSMENT/PLAN:  Assessment: Principal Problem:   CAP (community acquired pneumonia) Active Problems:   Gout   Hypertension   End stage COPD (HCC)   Chronic hypoxic respiratory failure (HCC)   Acute on chronic hypoxic respiratory failure (HCC)   COPD with acute  lower respiratory infection (HCC)  Trevino Wyatt. is a 63 year old man with end-stage COPD on 10 L O?, HTN, CKD, and gout, presenting with new left mid-lung consolidation and mild positional left back discomfort, consistent with community-acquired pneumonia and chronic hypoxic respiratory failure, currently stable on baseline oxygen  and improving on Augmentin, Azithromycin , prednisone , and supportive care (Duonebs, inhalers, BiPAP, lidocaine  patch).   #Community Acquired Pneumonia #Chronic Hypoxic Respiratory Failure / End-Stage COPD Recent hospitalization (10/11-10/15) for COPD exacerbation with RLL pneumonia; now returns with new left mid-lung consolidation and leukocytosis (WBC 17 K). He denies fever, cough, or sputum change, but positional left back discomfort and imaging are consistent with acute infectious process. Baseline 10 L O2 at rest, 15 L with ambulation; currently comfortable on home settings. No wheezing, sputum change, or acute distress. Mild leukocytosis likely steroid-related. Initially treated with broad-spectrum coverage (vanc/cefepime ) and quickly de-escalated to Augmentin and Azithromycin  given stable condition and no risk factors for Pseudomonas/MRSA. CTA negative for PE, showed spiculated nodular opacity which was scheduled to receive a PET scan 11/6, which was cancelled and will likely need to reschedule.  Broad respiratory panel negative. MRSA negative. Troponin negative. BNP 37.1  Plan - Continue Augmentin 875 mg BID daily (Final dose, 5 days of antibiotics, will be 11/9),  - Continue home Azithromycin  500mg  MWF - Encourage incentive spirometry and ambulation - Nebulizers: Incruse Ellipta , Breo Ellipta  , Montelukast , and Roflumilast  - Prednisone  40 mg daily (on 10mg  at home as baseline) - Duoneb q4h PRN - continue nightly BiPAP - Wean to baseline O2 as tolerated - Continue pulmonary hygiene and rehab efforts - Has appointment scheduled with outpatient pulmonology  for November 13th.  #Chest/Back Discomfort Intermittent left posterior chest discomfort, mild and positional, worse lying flat, improved sitting up. No ischemic features; reproducible on palpation. Could represent musculoskeletal strain or pleuritic irritation from pneumonia. Lidocaine  patch has been helpful. Plan - Lidocaine  patch daily to affected area - Encourage repositioning and gentle stretching - Monitor for any new pleuritic or cardiac features  #Hypertension Stable pressures (SBP 140s) on Amlodipine  10 mg daily. Plan - Continue Amlodipine  10 mg daily - Monitor BP daily while inpatient  #Gout Chronic, well controlled on Allopurinol  100 mg daily. No acute flare. Allergy to Colchicine  (Hives). Plan - Continue Allopurinol  100 mg daily  #Microcytic Anemia Stable mild anemia (Hgb 11.5, MCV 75), consistent with chronic baseline; asymptomatic. Plan - Trend CBC - Outpatient iron  studies if persistent  #CKD Stage II Stable renal function (Cr 0.96, eGFR > 60). Plan - Avoid nephrotoxins  #Hyperlipidemia On Zetia  10 mg daily, intolerant to statins. Plan - Continue  Zetia  10 mg daily  Best Practice: Diet: Regular diet VTE: rivaroxaban  (XARELTO ) tablet 10 mg Start: 02/13/24 1000 Code: DNR/DNI, but is ok with shocks (not compressions)  Disposition planning: Therapy Recs: Pending DISPO: Anticipated discharge in 1-2 days to home. Has outpt pulmonology appointment scheduled for November 13th.   Signature:  Penne Lera [facility]  11:38 AM, 02/15/2024  On Call pager [phone]

## 2024-02-16 LAB — CBC
HCT: 36.2 % — ABNORMAL LOW (ref 39.0–52.0)
Hemoglobin: 11 g/dL — ABNORMAL LOW (ref 13.0–17.0)
MCH: 22.6 pg — ABNORMAL LOW (ref 26.0–34.0)
MCHC: 30.4 g/dL (ref 30.0–36.0)
MCV: 74.3 fL — ABNORMAL LOW (ref 80.0–100.0)
Platelets: 263 K/uL (ref 150–400)
RBC: 4.87 MIL/uL (ref 4.22–5.81)
RDW: 16.5 % — ABNORMAL HIGH (ref 11.5–15.5)
WBC: 11.8 K/uL — ABNORMAL HIGH (ref 4.0–10.5)
nRBC: 0 % (ref 0.0–0.2)

## 2024-02-16 NOTE — Progress Notes (Signed)
 Upon arrival on seeing patient, patient was already on Bipap without this RT putting patient on Bipap.

## 2024-02-16 NOTE — Progress Notes (Addendum)
 HD#3 SUBJECTIVE:  Patient Summary: [name] Panas. is a 63 y.o. 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   Overnight Events: No acute events overnight  Interim History:  Patient appears to be at home oxygen  requirement. He states that he would rather wait another day here and work on getting comfortable moving around. Patient states that at baseline he is able to move from room to room but does not do much ambulating besides that.   OBJECTIVE:  Vital Signs: Vitals:   02/16/24 0011 02/16/24 0421 02/16/24 0806 02/16/24 0923  BP:  (!) 143/75 132/77   Pulse: 77 77    Resp: 17 20 18    Temp:  98.5 F (36.9 C) 98.6 F (37 C)   TempSrc:  Oral Oral   SpO2: 100% 100%  99%  Weight:      Height:       Supplemental O2: Nasal Cannula SpO2: 99 % O2 Flow Rate (L/min): 9 L/min FiO2 (%): 50 %  Filed Weights   02/13/24 0305 02/13/24 1700  Weight: 81 kg 76.1 kg    Intake/Output Summary (Last 24 hours) at 02/16/2024 1056 Last data filed at 02/16/2024 0807 Gross per 24 hour  Intake 1197 ml  Output 1800 ml  Net -603 ml   Net IO Since Admission: -1,600.48 mL [02/16/24 1056]  Physical Exam: Physical Exam Vitals reviewed.  Constitutional:      Comments: Chronically ill appearing   Pulmonary:     Effort: No respiratory distress.     Breath sounds: Decreased air movement present.     Comments: Decreased breath sounds bilaterally on 9L of O2  Abdominal:     Palpations: Abdomen is soft.  Skin:    General: Skin is warm and dry.  Neurological:     Mental Status: He is alert.    Patient Lines/Drains/Airways Status     Active Line/Drains/Airways     Name Placement date Placement time Site Days   Peripheral IV 02/13/24 20 G Left Antecubital 02/13/24  0258  Antecubital  less than 1            Pertinent labs and imaging:       Latest Ref Rng & Units 02/16/2024    2:56 AM 02/15/2024    3:17 AM 02/14/2024    3:02 AM  CBC  WBC 4.0 - 10.5 K/uL 11.8  13.4  13.7   Hemoglobin 13.0 - 17.0 g/dL 88.9  88.9  88.9   Hematocrit 39.0 - 52.0 % 36.2  35.1  36.0   Platelets 150 - 400 K/uL 263  226  220        Latest Ref Rng & Units 02/14/2024    3:02 AM 02/13/2024    3:07 AM 01/20/2024    3:42 AM  CMP  Glucose 70 - 99 mg/dL 90  94  889   BUN 8 - 23 mg/dL 12  11  12    Creatinine 0.61 - 1.24 mg/dL 8.82  9.03  8.97   Sodium 135 - 145 mmol/L 134  135  136   Potassium 3.5 - 5.1 mmol/L 3.7  3.5  4.6   Chloride 98 - 111 mmol/L 97  95  98   CO2 22 - 32 mmol/L 28  29  30    Calcium  8.9 - 10.3 mg/dL 9.4  9.9  9.3  CT Angio Chest Pulmonary Embolism (PE) W or WO Contrast Result Date: 02/13/2024 CLINICAL DATA:  Chest pain worse on right side. Possible pulmonary embolism. EXAM: CT ANGIOGRAPHY CHEST WITH CONTRAST TECHNIQUE: Multidetector CT imaging of the chest was performed using the standard protocol during bolus administration of intravenous contrast. Multiplanar CT image reconstructions and MIPs were obtained to evaluate the vascular anatomy. RADIATION DOSE REDUCTION: This exam was performed according to the departmental dose-optimization program which includes automated exposure control, adjustment of the mA and/or kV according to patient size and/or use of iterative reconstruction technique. CONTRAST:  75mL OMNIPAQUE  IOHEXOL  350 MG/ML SOLN COMPARISON:  11/01/2023, 07/31/2023 and 10/30/2018 FINDINGS: Cardiovascular: Right is within normal in size. Mild calcified plaque over the left anterior descending and right coronary arteries. Thoracic aorta is normal in caliber. Mild calcified plaque over the descending thoracic aorta. Pulmonary arterial system is well opacified without evidence of emboli. Remaining vascular structures are unremarkable. Mediastinum/Nodes: Cluster of lymph nodes over the AP window with the largest measuring  approximately 1 cm by short axis without significant change from April 2025, although increased in size compared to 2020. No hilar adenopathy. Remaining mediastinal structures are unremarkable. Lungs/Pleura: Lungs are adequately inflated with moderate centrilobular emphysematous disease. There is a spiculated nodular opacity over the posterior right upper lobe (image 47) without significant change compared to 07/31/2023, although new since 2020. Mild scarring/atelectatic change over the medial inferior aspect of the right middle lobe and also posterior basilar right lower lobe which is stable. Subtle interstitial prominence over the periphery of the right lower lobe which is stable. Waxing and waning process over the lingula has this irregular focal airspace density decreased in size from April 2025 to July 2025 as there is now a larger more confluent irregular airspace density on the current exam extending to the pleura. Mild increased subpleural reticular density over the lingula with hazy opacification of the inferior lingula. No effusion. Remainder the lungs are unchanged. Airways unremarkable. Upper Abdomen: Mild calcified plaque over the abdominal aorta. Cholesterol gallstones. No acute findings. Musculoskeletal: No focal abnormality. Review of the MIP images confirms the above findings. IMPRESSION: 1. No evidence of pulmonary embolism. 2. Waxing and waning process over the lingula where there is now a larger more confluent irregular airspace density extending to the pleura. Mild increased subpleural reticular density over the lingula with hazy opacification of the inferior lingula. Findings may be due to an acute on chronic process likely of infectious or inflammatory nature. Recommend follow-up CT 6 weeks. 3. Spiculated nodular opacity over the posterior right upper lobe without significant change compared to 07/31/2023, although new since 2020. Recommend referral to Multi-Disciplinary Thoracic Oncology  Cl[facility]) for consideration of PET-CT versus tissue sampling. 4. Cluster of lymph nodes over the AP window with the largest measuring approximately 1 cm by short axis without significant change from April 2025, although increased in size compared to 2020. 5. Moderate centrilobular emphysematous disease. 6. Cholelithiasis. 7. Aortic atherosclerosis. Atherosclerotic coronary artery disease. Aortic Atherosclerosis (ICD10-I70.0) and Emphysema (ICD10-J43.9). Electronically Signed   By: Toribio Agreste M.D.   On: 02/13/2024 11:35   DG Chest Port 1 View Result Date: 02/13/2024 EXAM: 1 VIEW(S) XRAY OF THE CHEST 02/13/2024 03:23:00 AM COMPARISON: 01/19/2024 CLINICAL HISTORY: SOB, COPD FINDINGS: LUNGS AND PLEURA: Hyperlucent lungs consistent with emphysema. New left mid lung consolidation. Improved right lower lung zone airspace disease. Less improved left lower lung zone airspace disease. No pulmonary edema. No pleural effusion. No pneumothorax. HEART AND MEDIASTINUM: No acute  abnormality of the cardiac and mediastinal silhouettes. BONES AND SOFT TISSUES: No acute osseous abnormality. IMPRESSION: 1. Emphysema. New left mid lung consolidation, suggesting acute infectious exacerbation. 2. Elsewhere Improved lower lung ventilation since October. No definite pleural effusion Electronically signed by: Helayne Hurst MD 02/13/2024 03:50 AM EST RP Workstation: [id number]    ASSESSMENT/PLAN:  Assessment: Principal Problem:   CAP (community acquired pneumonia) Active Problems:   Gout   Hypertension   End stage COPD (HCC)   Chronic hypoxic respiratory failure (HCC)   Acute on chronic hypoxic respiratory failure (HCC)   COPD with acute lower respiratory infection (HCC)  [name] Deshler. is a 63 year old man with end-stage COPD on 10 L O?, HTN, CKD, and gout, presenting with new left mid-lung consolidation and mild positional left back discomfort, consistent with community-acquired pneumonia and chronic hypoxic  respiratory failure, currently stable on baseline oxygen  and improving on Augmentin, Azithromycin , prednisone , and supportive care (Duonebs, inhalers, BiPAP, lidocaine  patch).   #Community Acquired Pneumonia #Chronic Hypoxic Respiratory Failure / End-Stage COPD Baseline is 10 L oxygen  at rest and 15 L oxygen  at exercise. Patient appears to be at baseline at rest but has hesitancy with ambulating, and notes that his oxygen  will drop during that time.  Patient is amenable to getting up and moving around with physical therapy/mobility specialist today to work on confidence with this.  Patient has not been using all of his nebulizers and states that this is due to feeling like his inhalers help his breathing more.Did discuss with patient that we feel that we have him on a good treatment plan and unfortunately deconditioning will also occur with each hospital admission. Leukocytosis is improving.   Plan - Continue Augmentin 875 mg BID + Azithromycin  500 mg daily (Final dose, 5 days of antibiotics, will be 11/8),  - Pt is on chronic Azithromycin  500mg  MWF, continue this after abx course. - Encourage incentive spirometry and ambulation - Continue home Symbicort  (Breo ellipta ), Montelukast , and Roflumilast  - Prednisone  40 mg until 11/10 (on 10mg  at home as baseline) - Duoneb q4h PRN - continue nightly BiPAP - Patient appears to be at baseline  - Continue pulmonary hygiene and rehab efforts - Has appointment scheduled with outpatient pulmonology for November 13th.  #Chest/Back Discomfort No complaints today. Likely to be MSK related.  Plan - Lidocaine  patch daily to affected area - Encourage repositioning and gentle stretching  #Hypertension Stable pressures  on Amlodipine  10 mg daily. Plan - Continue Amlodipine  10 mg daily - Monitor BP daily while inpatient  #Gout Chronic, well controlled on Allopurinol  100 mg daily. No acute flare. Allergy to Colchicine  (Hives). Plan - Continue Allopurinol   100 mg daily  #Microcytic Anemia Stable mild anemia, hemoglobin of 11, consistent with chronic baseline; asymptomatic. Plan - Trend CBC - Outpatient iron  studies if persistent  #CKD Stage II Stable renal function ( baseline around 1, eGFR > 60). Plan - Avoid nephrotoxins  #Hyperlipidemia On Zetia  10 mg daily, intolerant to statins. Plan - Continue Zetia  10 mg daily  Best Practice: Diet: Regular diet VTE: rivaroxaban  (XARELTO ) tablet 10 mg Start: 02/13/24 1000 Code: DNR/DNI, but is ok with shocks (not compressions)  Disposition planning: Therapy Recs: Pending DISPO: anticipating discharge tomorrow    Signature:  Simeon Vera D'Mello [facility]  10:56 AM, 02/16/2024  On Call pager [phone]

## 2024-02-16 NOTE — Plan of Care (Signed)
  Problem: Education: Goal: Knowledge of General Education information will improve Description: Including pain rating scale, medication(s)/side effects and non-pharmacologic comfort measures Outcome: Progressing   Problem: Health Behavior/Discharge Planning: Goal: Ability to manage health-related needs will improve Outcome: Progressing   Problem: Clinical Measurements: Goal: Ability to maintain clinical measurements within normal limits will improve Outcome: Progressing Goal: Will remain free from infection Outcome: Progressing Goal: Diagnostic test results will improve Outcome: Progressing Goal: Cardiovascular complication will be avoided Outcome: Progressing   Problem: Activity: Goal: Risk for activity intolerance will decrease Outcome: Progressing   Problem: Nutrition: Goal: Adequate nutrition will be maintained Outcome: Progressing   Problem: Coping: Goal: Level of anxiety will decrease Outcome: Progressing   Problem: Elimination: Goal: Will not experience complications related to bowel motility Outcome: Progressing Goal: Will not experience complications related to urinary retention Outcome: Progressing   Problem: Pain Managment: Goal: General experience of comfort will improve and/or be controlled Outcome: Progressing   Problem: Safety: Goal: Ability to remain free from injury will improve Outcome: Progressing   Problem: Skin Integrity: Goal: Risk for impaired skin integrity will decrease Outcome: Progressing   Problem: Education: Goal: Knowledge of General Education information will improve Description: Including pain rating scale, medication(s)/side effects and non-pharmacologic comfort measures Outcome: Progressing   Problem: Health Behavior/Discharge Planning: Goal: Ability to manage health-related needs will improve Outcome: Progressing   Problem: Clinical Measurements: Goal: Ability to maintain clinical measurements within normal limits will  improve Outcome: Progressing Goal: Will remain free from infection Outcome: Progressing Goal: Diagnostic test results will improve Outcome: Progressing Goal: Cardiovascular complication will be avoided Outcome: Progressing   Problem: Activity: Goal: Risk for activity intolerance will decrease Outcome: Progressing   Problem: Nutrition: Goal: Adequate nutrition will be maintained Outcome: Progressing   Problem: Coping: Goal: Level of anxiety will decrease Outcome: Progressing   Problem: Elimination: Goal: Will not experience complications related to bowel motility Outcome: Progressing Goal: Will not experience complications related to urinary retention Outcome: Progressing   Problem: Pain Managment: Goal: General experience of comfort will improve and/or be controlled Outcome: Progressing   Problem: Safety: Goal: Ability to remain free from injury will improve Outcome: Progressing   Problem: Skin Integrity: Goal: Risk for impaired skin integrity will decrease Outcome: Progressing   Problem: Education: Goal: Knowledge of General Education information will improve Description: Including pain rating scale, medication(s)/side effects and non-pharmacologic comfort measures Outcome: Progressing   Problem: Health Behavior/Discharge Planning: Goal: Ability to manage health-related needs will improve Outcome: Progressing   Problem: Clinical Measurements: Goal: Ability to maintain clinical measurements within normal limits will improve Outcome: Progressing Goal: Will remain free from infection Outcome: Progressing Goal: Diagnostic test results will improve Outcome: Progressing Goal: Cardiovascular complication will be avoided Outcome: Progressing   Problem: Activity: Goal: Risk for activity intolerance will decrease Outcome: Progressing   Problem: Nutrition: Goal: Adequate nutrition will be maintained Outcome: Progressing   Problem: Coping: Goal: Level of  anxiety will decrease Outcome: Progressing   Problem: Elimination: Goal: Will not experience complications related to bowel motility Outcome: Progressing Goal: Will not experience complications related to urinary retention Outcome: Progressing   Problem: Pain Managment: Goal: General experience of comfort will improve and/or be controlled Outcome: Progressing   Problem: Safety: Goal: Ability to remain free from injury will improve Outcome: Progressing   Problem: Skin Integrity: Goal: Risk for impaired skin integrity will decrease Outcome: Progressing

## 2024-02-16 NOTE — Progress Notes (Signed)
 PT Cancellation Note  Patient Details Name: [name] [name]. MRN: [id number] DOB: [birth year]   Cancelled Treatment:    Reason Eval/Treat Not Completed: Patient not medically ready  On the way to pt's room, his RN stopped me to say he just went on BiPAP and is waiting for respiratory therapy to come give him a treatment. Not a good time to try to walk. Noted plan is ?home tomorrow if can tolerate ambulating on 15L (his baseline). Will plan to see as early as possible 11/9.   [name] [name], PT Acute Rehabilitation Services  Office [phone]  [name] [name] 02/16/2024, 3:16 PM

## 2024-02-17 ENCOUNTER — Other Ambulatory Visit (HOSPITAL_COMMUNITY): Payer: Self-pay

## 2024-02-17 DIAGNOSIS — Z792 Long term (current) use of antibiotics: Secondary | ICD-10-CM

## 2024-02-17 DIAGNOSIS — I10 Essential (primary) hypertension: Secondary | ICD-10-CM

## 2024-02-17 DIAGNOSIS — J440 Chronic obstructive pulmonary disease with acute lower respiratory infection: Secondary | ICD-10-CM | POA: Diagnosis not present

## 2024-02-17 DIAGNOSIS — J189 Pneumonia, unspecified organism: Secondary | ICD-10-CM | POA: Diagnosis not present

## 2024-02-17 DIAGNOSIS — J9611 Chronic respiratory failure with hypoxia: Secondary | ICD-10-CM

## 2024-02-17 MED ORDER — PREDNISONE 20 MG PO TABS
40.0000 mg | ORAL_TABLET | Freq: Every day | ORAL | 0 refills | Status: AC
  Filled 2024-02-17: qty 2, 1d supply, fill #0

## 2024-02-17 MED ORDER — AMOXICILLIN-POT CLAVULANATE 875-125 MG PO TABS
1.0000 | ORAL_TABLET | Freq: Two times a day (BID) | ORAL | 0 refills | Status: AC
  Filled 2024-02-17: qty 1, 1d supply, fill #0

## 2024-02-17 MED ORDER — PREDNISONE 20 MG PO TABS
40.0000 mg | ORAL_TABLET | Freq: Every day | ORAL | 0 refills | Status: DC
  Filled 2024-02-17: qty 2, 1d supply, fill #0

## 2024-02-17 MED ORDER — AMOXICILLIN-POT CLAVULANATE 875-125 MG PO TABS
1.0000 | ORAL_TABLET | Freq: Two times a day (BID) | ORAL | 0 refills | Status: DC
  Filled 2024-02-17: qty 1, 1d supply, fill #0

## 2024-02-17 NOTE — Discharge Summary (Addendum)
 Name: [name] [name]. MRN: [id number] DOB: 1960/10/03 63 y.o. PCP: Delbert Clam, MD  Date of Admission: 02/13/2024  2:57 AM Date of Discharge: 02/17/2024 Attending Physician: Dr. Ronnald Sergeant  Discharge Diagnosis: 1. Principal Problem:   CAP (community acquired pneumonia) Active Problems:   Gout   Hypertension   End stage COPD (HCC)   Chronic hypoxic respiratory failure (HCC)   Acute on chronic hypoxic respiratory failure (HCC)   COPD with acute lower respiratory infection (HCC)  Discharge Medications: Allergies as of 02/17/2024       Reactions   Lipitor [atorvastatin ] Itching, Swelling   Facial, tongue swelling   Shellfish Allergy Anaphylaxis   Beef (diagnostic) Other (See Comments)   Gout flares   Bovine (beef) Protein-containing Drug Products Hives, Other (See Comments)   Causes gout    Colcrys  [colchicine ] Hives        Medication List     TAKE these medications    albuterol  (2.5 MG/3ML) 0.083% nebulizer solution Commonly known as: PROVENTIL  TAKE 3 MLS (2.5 MG TOTAL) BY NEBULIZATION EVERY 6 (SIX) HOURS AS NEEDED FOR SHORTNESS OF BREATH.   albuterol  108 (90 Base) MCG/ACT inhaler Commonly known as: Ventolin  HFA Inhale 2 puffs into the lungs every 6 (six) hours as needed for wheezing or shortness of breath   allopurinol  100 MG tablet Commonly known as: ZYLOPRIM  Take 1 tablet (100 mg total) by mouth daily.   amLODipine  10 MG tablet Commonly known as: NORVASC  Take 1 tablet (10 mg total) by mouth daily.   amoxicillin-clavulanate 875-125 MG tablet Commonly known as: AUGMENTIN Take 1 tablet by mouth every 12 (twelve) hours for 1 dose.   azithromycin  500 MG tablet Commonly known as: Zithromax  Take 1 tablet (500 mg total) by mouth 3 (three) times a week. Take 1 tablet (500 mg total) by mouth 3 (three) times a week. (Monday, Wednesday, Friday)   cyanocobalamin  1000 MCG tablet Commonly known as: VITAMIN B12 Take 1,000 mcg by mouth daily.   ezetimibe  10 MG  tablet Commonly known as: ZETIA  Take 1 tablet (10 mg total) by mouth daily.   loperamide  2 MG capsule Commonly known as: IMODIUM  Take 1 capsule (2 mg total) by mouth as needed for diarrhea or loose stools.   montelukast  10 MG tablet Commonly known as: SINGULAIR  Take 1 tablet (10 mg total) by mouth at bedtime.   OXYGEN  Inhale 10 L into the lungs continuous.   predniSONE  10 MG tablet Commonly known as: DELTASONE  Take 1 tablet (10 mg total) by mouth daily with breakfast. What changed: Another medication with the same name was added. Make sure you understand how and when to take each.   predniSONE  20 MG tablet Commonly known as: DELTASONE  Take 2 tablets (40 mg total) by mouth daily with breakfast for 1 dose. Start taking on: February 18, 2024 What changed: You were already taking a medication with the same name, and this prescription was added. Make sure you understand how and when to take each.   roflumilast  500 MCG Tabs tablet Commonly known as: DALIRESP  Take 1 tablet (500 mcg total) by mouth daily.   Spiriva  HandiHaler 18 MCG Caps Generic drug: Tiotropium Bromide  PLACE 1 CAPSULE (18 MCG TOTAL) INTO INHALER AND INHALE DAILY.   Symbicort  160-4.5 MCG/ACT inhaler Generic drug: budesonide -formoterol  Inhale 2 puffs into the lungs 2 (two) times daily.   tiZANidine  4 MG tablet Commonly known as: Zanaflex  Take 1 tablet (4 mg total) by mouth every 8 (eight) hours as needed for muscle spasms.  Vitamin D  (Ergocalciferol ) 1.25 MG (50000 UNIT) Caps capsule Commonly known as: DRISDOL  Take 1 capsule (50,000 Units total) by mouth every 7 (seven) days.               Durable Medical Equipment  (From admission, onward)           Start     Ordered   02/17/24 1125  DME Tub Bench  Once        02/17/24 1125           Disposition and follow-up:   [name] Nechama Raddle. was discharged from [facility] in Stable condition.  At the hospital follow up visit  please address:  1.  Prognosis for his worsening end-stage COPD: Please ensure [name] [name] is taking his Prednisone  10mg  daily and his Aizthromycin 500mg  MWF. Make sure [name] [name] follows with his pulmonologist 11/13.  2.  Pt was scheduled for a PET scan for a spiculated lesion on his lungs which should have taken place at [facility] but was missed because [name] [name] was admitted. This should be re-scheduled for further evaluation of the lesion.  3.  Labs / imaging needed at time of follow-up: n/a  4.  Pending labs/ test needing follow-up: n/a  5. Incidental findings: Cholelithiasis, Waxing and waning process over the lingula where there is now a larger more confluent irregular airspace density extending to the pleura. -> Recommended follow-up CT in 6 weeks.   Follow-up Appointments: 02/21/2024 with pulmonology at 1:00pm 03/26/2024 with PCP at 2:50pm  Hospital Course by problem list: [name] Ivery. is a 63 y.o. person living with a history of end-stage COPD on 10L O2 at home, HTN, CKD II-III, and gout who presented with mild shortness of breath and left posterior chest wall discomfort and admitted for CAP. Now being discharged on hospital day 4 with the following pertinent hospital course:  # Community Acquired Pneumonia # Chronic Hypoxic Respiratory Failure / End-Stage COPD Pt came to the ED for concern of slightly worsened difficulty breathing in the setting of his grandson vaping more in the house. [name] [name] is on 10L O2 via [place] at baseline, and wears a BiPAP at home at night and during naps. Imaging was suggestive of small CAP, but also visualized the spiculated lesion which was scheduled to have an outpt PET scan at [facility] on 02/14/2024 which required rescheduling. [name] [name] was given an increased dose of his home prednisone , and nebulized LAMA/LABA treatment to which [name] [name] responded well. [name] [name] was also given Augmentin and Azithromycin  for CAP coverage to which [name] [name] had some diarrhea, but otherwise tolerated well. His comfort  breathing improved to some degree, thought not back to baseline. It was communicated to him multiple times that given his end-stage COPD, it may never come back to baseline.  # Chest/Back Discomfort Pt presented with intermittent left posterior chest discomfort which was mild and positional. There were no found ischemic features and was reproducible on palpation and helped with a lidocaine  patch. Unclear if this is from a musculoskeletal strain from sleeping on that side, or from pleuritic irritation as this is over the site of the pneumonia. The lidocaine  patch has been beneficial for the pain.  # Stable Conditions The following conditions have been monitored and well managed with the patient's home therapy: HTN, Gout, Microcytic Anemia, CKD Stage II, Hyperlipidemia.   Stable chronic medical conditions: HTN Gout Microcytic Anemia CKD Hyperlipidemia  Subjective  There were no acute events overnight, vital signs have been stable, and  morning labs were nonconcerning. Speaking with patient, [name] [name] says [name] [name] is ready to try going home today. [name] [name] voiced understanding of the importance of going to his outpatient pulmonology appointment, and [name] [name] would like to be picked up by a family member instead of public transport.  Discharge Exam:   BP (!) 149/80 (BP Location: Right Arm)   Pulse 98   Temp 98.6 F (37 C) (Oral)   Resp 18   Ht 5' 6 (1.676 m)   Wt 76.1 kg   SpO2 97%   BMI 27.08 kg/m   Discharge exam:  Physical Exam Vitals and nursing note reviewed. Exam conducted with a chaperone present.  Constitutional:      Appearance: [name] [name] is ill-appearing.  Cardiovascular:     Rate and Rhythm: Normal rate.     Heart sounds: Normal heart sounds.  Pulmonary:     Breath sounds: Decreased air movement present. No wheezing, rhonchi or rales.     Comments: On 10L of O2 via [place] Skin:    General: Skin is warm and dry.  Neurological:     Mental Status: [name] [name] is alert and oriented to person, place, and time.      Pertinent Labs, Studies, and Procedures:     Latest Ref Rng & Units 02/16/2024    2:56 AM 02/15/2024    3:17 AM 02/14/2024    3:02 AM  CBC  WBC 4.0 - 10.5 K/uL 11.8  13.4  13.7   Hemoglobin 13.0 - 17.0 g/dL 88.9  88.9  88.9   Hematocrit 39.0 - 52.0 % 36.2  35.1  36.0   Platelets 150 - 400 K/uL 263  226  220        Latest Ref Rng & Units 02/14/2024    3:02 AM 02/13/2024    3:07 AM 01/20/2024    3:42 AM  CMP  Glucose 70 - 99 mg/dL 90  94  889   BUN 8 - 23 mg/dL 12  11  12    Creatinine 0.61 - 1.24 mg/dL 8.82  9.03  8.97   Sodium 135 - 145 mmol/L 134  135  136   Potassium 3.5 - 5.1 mmol/L 3.7  3.5  4.6   Chloride 98 - 111 mmol/L 97  95  98   CO2 22 - 32 mmol/L 28  29  30    Calcium  8.9 - 10.3 mg/dL 9.4  9.9  9.3     CT Angio Chest Pulmonary Embolism (PE) W or WO Contrast Result Date: 02/13/2024 CLINICAL DATA:  Chest pain worse on right side. Possible pulmonary embolism. EXAM: CT ANGIOGRAPHY CHEST WITH CONTRAST TECHNIQUE: Multidetector CT imaging of the chest was performed using the standard protocol during bolus administration of intravenous contrast. Multiplanar CT image reconstructions and MIPs were obtained to evaluate the vascular anatomy. RADIATION DOSE REDUCTION: This exam was performed according to the departmental dose-optimization program which includes automated exposure control, adjustment of the mA and/or kV according to patient size and/or use of iterative reconstruction technique. CONTRAST:  75mL OMNIPAQUE  IOHEXOL  350 MG/ML SOLN COMPARISON:  11/01/2023, 07/31/2023 and 10/30/2018 FINDINGS: Cardiovascular: Right is within normal in size. Mild calcified plaque over the left anterior descending and right coronary arteries. Thoracic aorta is normal in caliber. Mild calcified plaque over the descending thoracic aorta. Pulmonary arterial system is well opacified without evidence of emboli. Remaining vascular structures are unremarkable. Mediastinum/Nodes: Cluster of lymph nodes over  the AP window with the largest measuring approximately 1 cm by short axis without significant change  from April 2025, although increased in size compared to 2020. No hilar adenopathy. Remaining mediastinal structures are unremarkable. Lungs/Pleura: Lungs are adequately inflated with moderate centrilobular emphysematous disease. There is a spiculated nodular opacity over the posterior right upper lobe (image 47) without significant change compared to 07/31/2023, although new since 2020. Mild scarring/atelectatic change over the medial inferior aspect of the right middle lobe and also posterior basilar right lower lobe which is stable. Subtle interstitial prominence over the periphery of the right lower lobe which is stable. Waxing and waning process over the lingula has this irregular focal airspace density decreased in size from April 2025 to July 2025 as there is now a larger more confluent irregular airspace density on the current exam extending to the pleura. Mild increased subpleural reticular density over the lingula with hazy opacification of the inferior lingula. No effusion. Remainder the lungs are unchanged. Airways unremarkable. Upper Abdomen: Mild calcified plaque over the abdominal aorta. Cholesterol gallstones. No acute findings. Musculoskeletal: No focal abnormality. Review of the MIP images confirms the above findings. IMPRESSION: 1. No evidence of pulmonary embolism. 2. Waxing and waning process over the lingula where there is now a larger more confluent irregular airspace density extending to the pleura. Mild increased subpleural reticular density over the lingula with hazy opacification of the inferior lingula. Findings may be due to an acute on chronic process likely of infectious or inflammatory nature. Recommend follow-up CT 6 weeks. 3. Spiculated nodular opacity over the posterior right upper lobe without significant change compared to 07/31/2023, although new since 2020. Recommend referral to  [facility]) for consideration of PET-CT versus tissue sampling. 4. Cluster of lymph nodes over the AP window with the largest measuring approximately 1 cm by short axis without significant change from April 2025, although increased in size compared to 2020. 5. Moderate centrilobular emphysematous disease. 6. Cholelithiasis. 7. Aortic atherosclerosis. Atherosclerotic coronary artery disease. Aortic Atherosclerosis (ICD10-I70.0) and Emphysema (ICD10-J43.9). Electronically Signed   By: Toribio Agreste M.D.   On: 02/13/2024 11:35   DG Chest Port 1 View Result Date: 02/13/2024 EXAM: 1 VIEW(S) XRAY OF THE CHEST 02/13/2024 03:23:00 AM COMPARISON: 01/19/2024 CLINICAL HISTORY: SOB, COPD FINDINGS: LUNGS AND PLEURA: Hyperlucent lungs consistent with emphysema. New left mid lung consolidation. Improved right lower lung zone airspace disease. Less improved left lower lung zone airspace disease. No pulmonary edema. No pleural effusion. No pneumothorax. HEART AND MEDIASTINUM: No acute abnormality of the cardiac and mediastinal silhouettes. BONES AND SOFT TISSUES: No acute osseous abnormality. IMPRESSION: 1. Emphysema. New left mid lung consolidation, suggesting acute infectious exacerbation. 2. Elsewhere Improved lower lung ventilation since October. No definite pleural effusion Electronically signed by: Helayne Hurst MD 02/13/2024 03:50 AM EST RP Workstation: [id number]    Signed: Lera Nancyann NOVAK, DO 02/17/2024, 11:25 AM

## 2024-02-17 NOTE — Progress Notes (Signed)
 Physical Therapy Treatment Patient Details Name: [name] [name]. MRN: [id number] DOB: 21-Nov-1960 Today's Date: 02/17/2024   History of Present Illness 63 y/o male adm 02/13/24 with SOB, CAP and chronic respiratory failure. PMHx: COPD on home 10L, CKD, HTN, HLD, gout.    PT Comments  Patient met seated EOB, agreeable to participate in mobility.  O2 sats > 97% spO2 at rest while seated EOB. Donned 15L/min via [place] for ambulation. Gait completed 60 feet x1 and 50 feet x1 with standing rest break between. Supervision for safety and assist with O2 management. Brief O2 drop to 86% spO2 however, patient able to self monitor via pulse/ox and recover with pursed lip breathing. Occasional cues for postural awareness and pacing. Returned to seated position end of session and 10L/min supplemental O2. Will continue to follow acutely.   If plan is discharge home, recommend the following: Assistance with cooking/housework;A little help with bathing/dressing/bathroom;Assist for transportation   Can travel by private vehicle        Equipment Recommendations  None recommended by PT    Recommendations for Other Services       Precautions / Restrictions Precautions Precautions: Fall;Other (comment) Recall of Precautions/Restrictions: Intact Precaution/Restrictions Comments: watch sats 10L rest, 15L gait Restrictions Weight Bearing Restrictions Per Provider Order: No     Mobility  Bed Mobility               General bed mobility comments: seated EOB upon entry    Transfers Overall transfer level: Needs assistance Equipment used: Rolling walker (2 wheels) Transfers: Sit to/from Stand, Bed to chair/wheelchair/BSC Sit to Stand: Supervision                Ambulation/Gait Ambulation/Gait assistance: Supervision Gait Distance (Feet): 110 Feet ((60 feet x1 and 50 feet x1), standing rest break) Assistive device: Rolling walker (2 wheels) Gait Pattern/deviations: Step-through pattern,  Decreased stride length, Trunk flexed, Shuffle   Gait velocity interpretation: <1.31 ft/sec, indicative of household Nurse, Learning Disability Bed    Modified Rankin (Stroke Patients Only)       Balance Overall balance assessment: Needs assistance Sitting-balance support: No upper extremity supported, Feet supported Sitting balance-Leahy Scale: Good     Standing balance support: Bilateral upper extremity supported, During functional activity Standing balance-Leahy Scale: Fair Standing balance comment: can stand without support,walker for gait                            Communication Communication Communication: No apparent difficulties  Cognition Arousal: Alert Behavior During Therapy: WFL for tasks assessed/performed   PT - Cognitive impairments: No apparent impairments                         Following commands: Intact      Cueing Cueing Techniques: Verbal cues  Exercises      General Comments General comments (skin integrity, edema, etc.): O2 on 15L/min with gait. Briefly dropping 86-88% spO2, patient able to recover < 45 seconds with standing rest break and PLB.      Pertinent Vitals/Pain Pain Assessment Pain Assessment: No/denies pain    Home Living                          Prior Function  PT Goals (current goals can now be found in the care plan section) Progress towards PT goals: Progressing toward goals    Frequency    Min 1X/week      PT Plan      Co-evaluation              AM-PAC PT 6 Clicks Mobility   Outcome Measure  Help needed turning from your back to your side while in a flat bed without using bedrails?: None Help needed moving from lying on your back to sitting on the side of a flat bed without using bedrails?: None Help needed moving to and from a bed to a chair (including a wheelchair)?: None Help needed standing up from a  chair using your arms (e.g., wheelchair or bedside chair)?: A Little Help needed to walk in hospital room?: A Little Help needed climbing 3-5 steps with a railing? : A Little 6 Click Score: 21    End of Session Equipment Utilized During Treatment: Oxygen  Activity Tolerance: Patient tolerated treatment well Patient left: with call bell/phone within reach;in bed (seated EOB) Nurse Communication: Mobility status PT Visit Diagnosis: Other abnormalities of gait and mobility (R26.89);Difficulty in walking, not elsewhere classified (R26.2)     Time: 9054-8995 PT Time Calculation (min) (ACUTE ONLY): 19 min  Charges:    $Gait Training: 8-22 mins PT General Charges $$ ACUTE PT VISIT: 1 Visit                     Sherryle [place]alo[place], PT, DPT [facility] Acute Rehabilitation Office: [phone]    Sherryle VEAR [place] 02/17/2024, 11:14 AM

## 2024-02-17 NOTE — TOC Transition Note (Signed)
 Transition of Care [facility]) - Discharge Note   Patient Details  Name: [name] [name]. MRN: [id number] Date of Birth: [birth year]  [facility] of [facility] CM/SW Contact:  Marval Gell, RN Phone Number: 02/17/2024, 11:31 AM   Clinical Narrative:      Spoke with patient over the phone, he declines tub bench. He states family will bring oxygen  for transport when they come to pick him up    Barriers to Discharge: Continued Medical Work up   Patient Goals and CMS Choice            Discharge Placement                       Discharge Plan and Services Additional resources added to the After Visit Summary for                                       Social Drivers of Health (SDOH) Interventions SDOH Screenings   Food Insecurity: No Food Insecurity (02/13/2024)  Housing: Low Risk  (02/13/2024)  Transportation Needs: No Transportation Needs (02/13/2024)  Recent Concern: Transportation Needs - Unmet Transportation Needs (02/08/2024)  Utilities: Not At Risk (02/13/2024)  Depression (PHQ2-9): Low Risk  (02/08/2024)  Social Connections: Moderately Isolated (02/13/2024)  Tobacco Use: Medium Risk (02/13/2024)     Readmission Risk Interventions    12/18/2023   12:11 PM 11/02/2023   10:59 AM 09/14/2023    1:09 PM  Readmission Risk Prevention Plan  Post Dischage Appt Complete  Complete  Medication Screening Complete  Complete  Transportation Screening Complete Complete Complete  PCP or Specialist Appt within 5-7 Days  Complete   Home Care Screening  Complete   Medication Review (RN CM)  Complete

## 2024-02-17 NOTE — Discharge Instructions (Addendum)
 Thank you for allowing us  to be part of your care. You were hospitalized for Community Acquired Pneumonia. We treated you with antibiotics (Augment and Azithromycin ), steroids (Prednisone ), and inhalers/nebulizers (Breo Ellipta , Duoneb, Roflumilast , Incruse Ellipta ).  See the changes in your medications and management of your chronic conditions below:  *For your Pneumonia - Take your last dose of Augmentin 11/9 this evening  - Take 40mg  of Prednisone  on 11/10 (Monday), then go back to taking 10mg  daily starting 11/11 (Tuesday). - Continue taking your breathing treatments as currently prescribed. - Continue taking your Azithromycin  every Monday, Wednesday, and Friday (3 times a week). - We have not stopped any of your previous medications  FOLLOW UP APPOINTMENTS: We arranged for you to follow up with your family doctor Dr. Newlin on 03/26/2024 2:50 PM (Arrive by 2:35 PM). Please keep your appointment with your pulmonologist for 02/21/2024 at 1:00pm (arrive by 12:45)  Please call your PCP or our clinic if you have any questions or concerns, we may be able to help and keep you from a long and expensive emergency room wait. Our clinic and after hours phone number is [phone]. The best time to call is Monday through Friday 9 am to 4 pm but there is always someone available 24/7 if you have an emergency. If you need medication refills please notify your pharmacy one week in advance and they will send us  a request.   We are glad you are feeling better,  Penne Mori; DO Internal Medicine Inpatient Teaching Service at [facility]

## 2024-02-17 NOTE — Plan of Care (Signed)

## 2024-02-18 ENCOUNTER — Other Ambulatory Visit: Payer: Self-pay

## 2024-02-18 ENCOUNTER — Other Ambulatory Visit: Payer: Self-pay | Admitting: Family Medicine

## 2024-02-18 ENCOUNTER — Telehealth: Payer: Self-pay | Admitting: *Deleted

## 2024-02-18 DIAGNOSIS — J449 Chronic obstructive pulmonary disease, unspecified: Secondary | ICD-10-CM

## 2024-02-18 NOTE — Transitions of Care (Post Inpatient/ED Visit) (Signed)
 02/18/2024  Name: [name] [name]. MRN: [id number] DOB: 01/09/1961  Today's TOC FU Call Status: Today's TOC FU Call Status:: Successful TOC FU Call Completed TOC FU Call Complete Date: 02/18/24 Patient's Name and Date of Birth confirmed.  Transition Care Management Follow-up Telephone Call Date of Discharge: 02/17/24 Discharge Facility: [facility]) Type of Discharge: Inpatient Admission Primary Inpatient Discharge Diagnosis:: community acquired pneumonia How have you been since you were released from the hospital?: Better Any questions or concerns?: No  Items Reviewed: Did you receive and understand the discharge instructions provided?: Yes Medications obtained,verified, and reconciled?: Yes (Medications Reviewed) Any new allergies since your discharge?: No Dietary orders reviewed?: Yes Type of Diet Ordered:: heart healthy, low sodium Do you have support at home?: Yes People in Home [RPT]: child(ren), adult Name of Support/Comfort Primary Source: Erika/Daughter  Medications Reviewed Today: Medications Reviewed Today     Reviewed by Lucky Andrea LABOR, RN (Registered Nurse) on 02/18/24 at 1427  Med List Status: <None>   Medication Order Taking? Sig Documenting Provider Last Dose Status Informant  albuterol  (PROVENTIL ) (2.5 MG/3ML) 0.083% nebulizer solution [id number] Yes TAKE 3 MLS (2.5 MG TOTAL) BY NEBULIZATION EVERY 6 (SIX) HOURS AS NEEDED FOR SHORTNESS OF BREATH. Newlin, Enobong, MD  Active Self, Pharmacy Records  albuterol  (VENTOLIN  HFA) 108 [phone] Base) MCG/ACT inhaler [id number] Yes Inhale 2 puffs into the lungs every 6 (six) hours as needed for wheezing or shortness of breath Delbert Clam, MD  Active Self, Pharmacy Records    Discontinued 07/03/11 1455 (Entry Error)   allopurinol  (ZYLOPRIM ) 100 MG tablet [id number] Yes Take 1 tablet (100 mg total) by mouth daily. Newlin, Enobong, MD  Active Self, Pharmacy Records  amLODipine  (NORVASC ) 10 MG tablet [id number] Yes Take 1 tablet  (10 mg total) by mouth daily. Newlin, Enobong, MD  Active Self, Pharmacy Records  amoxicillin-clavulanate (AUGMENTIN) 875-125 MG tablet [id number] Yes Take 1 tablet by mouth every 12 (twelve) hours for 1 dose. Lera Nancyann NOVAK, DO  Active   Ascorbic Acid  (VITAMIN C ) 1000 MG tablet [id number] Yes Take 1,000 mg by mouth daily. [provider]  Active   azithromycin  (ZITHROMAX ) 500 MG tablet [id number] Yes Take 1 tablet (500 mg total) by mouth 3 (three) times a week. Take 1 tablet (500 mg total) by mouth 3 (three) times a week. (Monday, Wednesday, Friday) Napoleon Limes, MD  Active Self, Pharmacy Records  budesonide -formoterol  (SYMBICORT ) 160-4.5 MCG/ACT inhaler [id number] Yes Inhale 2 puffs into the lungs 2 (two) times daily. Mannam, Praveen, MD  Active Self, Pharmacy Records  cyanocobalamin  (VITAMIN B12) 1000 MCG tablet [id number] Yes Take 1,000 mcg by mouth daily. [provider]  Active Self, Pharmacy Records  ezetimibe  (ZETIA ) 10 MG tablet [id number] Yes Take 1 tablet (10 mg total) by mouth daily. Newlin, Enobong, MD  Active Self, Pharmacy Records  guaiFENesin  (MUCINEX ) 600 MG 12 hr tablet [id number] Yes Take 600 mg by mouth daily. [provider]  Active   loperamide  (IMODIUM ) 2 MG capsule [id number] Yes Take 1 capsule (2 mg total) by mouth as needed for diarrhea or loose stools. Smucker, Limes, MD  Active Self, Pharmacy Records  montelukast  (SINGULAIR ) 10 MG tablet [id number] Yes Take 1 tablet (10 mg total) by mouth at bedtime. Newlin, Enobong, MD  Active Self, Pharmacy Records  OXYGEN  [id number] Yes Inhale 10 L into the lungs continuous. [provider]  Active Self, Pharmacy Records           Med Note JACKOLYN, Maybell H   Wed  Feb 13, 2024  7:13 AM)    predniSONE  (DELTASONE ) 10 MG tablet [id number]  Take 1 tablet (10 mg total) by mouth daily with breakfast.  Patient not taking: Reported on 02/18/2024   Napoleon Limes, MD  Active Self, Pharmacy Records   predniSONE  (DELTASONE ) 20 MG tablet [id number] Yes Take 2 tablets (40 mg total) by mouth daily with breakfast for 1 dose. Lera Nancyann NOVAK, DO  Active   roflumilast  (DALIRESP ) 500 MCG TABS tablet [id number] Yes Take 1 tablet (500 mcg total) by mouth daily. Mannam, Praveen, MD  Active Self, Pharmacy Records  tiotropium (SPIRIVA  HANDIHALER) 18 MCG inhalation capsule [id number] Yes PLACE 1 CAPSULE (18 MCG TOTAL) INTO INHALER AND INHALE DAILY. Newlin, Enobong, MD  Active Self, Pharmacy Records  tiZANidine  (ZANAFLEX ) 4 MG tablet [id number] Yes Take 1 tablet (4 mg total) by mouth every 8 (eight) hours as needed for muscle spasms. Newlin, Enobong, MD  Active Self, Pharmacy Records  Vitamin D , Ergocalciferol , (DRISDOL ) 1.25 MG (50000 UNIT) CAPS capsule [id number] Yes Take 1 capsule (50,000 Units total) by mouth every 7 (seven) days. Newlin, Enobong, MD  Active Self, Pharmacy Records  Med List Note Steffi Nian, [place] 01/21/24 9048): Bipap at bedtime             Home Care and Equipment/Supplies: Were Home Health Services Ordered?: No Any new equipment or medical supplies ordered?: No  Functional Questionnaire: Do you need assistance with bathing/showering or dressing?: No Do you need assistance with meal preparation?: Yes (Family prepares meals) Do you need assistance with eating?: No Do you have difficulty maintaining continence: No Do you need assistance with getting out of bed/getting out of a chair/moving?: No Do you have difficulty managing or taking your medications?: No  Follow up appointments reviewed: PCP Follow-up appointment confirmed?: Yes Date of PCP follow-up appointment?: 03/26/24 (Discussed scheduling earlier appointment with PCP, patient declines) Follow-up Provider: Dr. Newlin Specialist [facility] Follow-up appointment confirmed?: Yes Date of Specialist follow-up appointment?: 02/21/24 Follow-Up Specialty Provider:: Pulmonology Do you need transportation to your follow-up  appointment?: No Do you understand care options if your condition(s) worsen?: Yes-patient verbalized understanding  SDOH Interventions Today    Flowsheet Row Most Recent Value  SDOH Interventions   Food Insecurity Interventions Intervention Not Indicated  Housing Interventions Intervention Not Indicated  Transportation Interventions Intervention Not Indicated  Utilities Interventions Intervention Not Indicated    Andrea Dimes RN, BSN [place]  [facility] RN Care Manager [phone]

## 2024-02-19 NOTE — Telephone Encounter (Signed)
 Provider not at this location Requested Prescriptions  Pending Prescriptions Disp Refills   budesonide -formoterol  (SYMBICORT ) 160-4.5 MCG/ACT inhaler 10.2 g 0    Sig: Inhale 2 puffs into the lungs 2 (two) times daily.     There is no refill protocol information for this order

## 2024-02-19 NOTE — Telephone Encounter (Signed)
 Requested medications are due for refill today.  unsure  Requested medications are on the active medications list.  yes  Last refill. 01/23/2024 #[id number] rf  Future visit scheduled.   yes  Notes to clinic.  Refill not delegated. Rx signed by Dr. Napoleon    Requested Prescriptions  Pending Prescriptions Disp Refills   azithromycin  (ZITHROMAX ) 500 MG tablet 36 tablet 0    Sig: Take 1 tablet (500 mg total) by mouth 3 (three) times a week. Take 1 tablet (500 mg total) by mouth 3 (three) times a week. (Monday, Wednesday, Friday)     Off-Protocol Failed - 02/19/2024  4:03 PM      Failed - Medication not assigned to a protocol, review manually.      Passed - Valid encounter within last 12 months    Recent Outpatient Visits           1 month ago Leukocytosis, unspecified type   [place] [facility] - A Dept Of [place]. [facility] Delbert Clam, MD   4 months ago COPD without exacerbation [facility])   [place] [facility] [place] [facility] Of [place]. [facility] Delbert Clam, MD   7 months ago Hospital discharge follow-up   [facility] [place] - [facility] Dept Of [place]. [facility] [place], Jon HERO, [place]   1 year ago Hospital discharge follow-up   [facility] Celestia Rosaline SQUIBB, NP   1 year ago Screening for diabetes mellitus   [place] [facility] - [facility] Dept Of [place]. [facility] Delbert Clam, MD

## 2024-02-20 ENCOUNTER — Other Ambulatory Visit: Payer: Self-pay

## 2024-02-20 MED ORDER — PREDNISONE 10 MG PO TABS
10.0000 mg | ORAL_TABLET | Freq: Every day | ORAL | 0 refills | Status: DC
  Filled 2024-02-20: qty 30, 30d supply, fill #0

## 2024-02-20 MED ORDER — LOPERAMIDE HCL 2 MG PO CAPS
2.0000 mg | ORAL_CAPSULE | ORAL | 0 refills | Status: DC | PRN
  Filled 2024-02-20: qty 30, 30d supply, fill #0

## 2024-02-21 ENCOUNTER — Encounter: Payer: Self-pay | Admitting: Pulmonary Disease

## 2024-02-21 ENCOUNTER — Ambulatory Visit (INDEPENDENT_AMBULATORY_CARE_PROVIDER_SITE_OTHER): Admitting: Pulmonary Disease

## 2024-02-21 ENCOUNTER — Telehealth: Payer: Self-pay | Admitting: *Deleted

## 2024-02-21 VITALS — BP 121/79 | HR 95 | Temp 98.4°F | Ht 67.0 in | Wt 163.0 lb

## 2024-02-21 DIAGNOSIS — Z9981 Dependence on supplemental oxygen: Secondary | ICD-10-CM | POA: Diagnosis not present

## 2024-02-21 DIAGNOSIS — I2722 Pulmonary hypertension due to left heart disease: Secondary | ICD-10-CM | POA: Diagnosis not present

## 2024-02-21 DIAGNOSIS — Z23 Encounter for immunization: Secondary | ICD-10-CM | POA: Diagnosis not present

## 2024-02-21 DIAGNOSIS — J449 Chronic obstructive pulmonary disease, unspecified: Secondary | ICD-10-CM

## 2024-02-21 DIAGNOSIS — R911 Solitary pulmonary nodule: Secondary | ICD-10-CM

## 2024-02-21 DIAGNOSIS — I2723 Pulmonary hypertension due to lung diseases and hypoxia: Secondary | ICD-10-CM

## 2024-02-21 NOTE — Transitions of Care (Post Inpatient/ED Visit) (Signed)
   02/21/2024  Name: [name] [name]. MRN: [id number] DOB: 1960-10-25  RNCM received VM from [name] [name], requesting a call back. RNCM returned call. [name] [name] is inquiring about housing resources. RNCM offered to place a BSW referral. Patient declined at this time. RNCM provided patient with CCM/Faye, RN contact number should he change his mind. [name] [name] voiced understanding.   Andrea Dimes RN, BSN [place]  [facility] RN Care Manager [phone]

## 2024-02-21 NOTE — Addendum Note (Signed)
 Addended by: MOODY RANCHER on: 02/21/2024 04:44 PM   Modules accepted: Orders

## 2024-02-21 NOTE — Patient Instructions (Signed)
  VISIT SUMMARY: You had a follow-up visit to check on your severe COPD after recent hospitalizations for COPD exacerbation and pneumonia. We discussed your current medications, oxygen  needs, and vaccination status. We also reviewed your stable lung nodule and your care preferences, including your DNR status.  YOUR PLAN: SEVERE CHRONIC OBSTRUCTIVE PULMONARY DISEASE (COPD): You have severe COPD and are currently on long-term supplemental oxygen  therapy. -Continue taking Symbicort , Spiriva , Daliresp , azithromycin , and prednisone  as prescribed. -We have ordered virtual pulmonary rehabilitation for you. -You received a flu vaccine today. -Please discuss Pneumonia, RSV and COVID vaccines with your primary care provider.  STABLE SPICULATED PULMONARY NODULE: You have a spiculated nodule in your right upper lung that has remained stable over the past year. -No further diagnostic testing or treatment is needed at this time. You have deferred the PET scan   DO NOT RESUSCITATE (DNR) STATUS: You have a DNR status in place and prefer to avoid aggressive treatments. -We will continue to honor your DNR status.                                 Contains text generated by Abridge.

## 2024-02-21 NOTE — Progress Notes (Addendum)
 [address]              [name] [name]    [id number]    07-Dec-1960  Primary Care Physician:Newlin, Corrina, MD  Referring Physician: Delbert Corrina, MD [address] [place] [address] [place],  [place] [address]  Chief complaint:   Follow up for COPD Pulmonary HTN  HPI: [name] is a 63 y.o.  with extensive smoking history. His main complaint is dyspnea on exertion for the past several years. He has chronic cough with mucus production. Denies any wheezing, hemoptysis. He was maintained initially on just albuterol  rescue inhaler. He is maintained on spiriva . He feels very currently. He has dyspnea on exertion which is not different from baseline. Denies any cough, sputum production, hemoptysis, fever, weight loss.  Admitted to hospital from 1/31 to 05/14/2022 with COVID-19 pneumonia, COPD exacerbation, acute on chronic hypoxic respiratory failure.  Treated with steroids, bronchodilators.  He received 1 dose of Actemra  on 05/10/2022.  Initially on high flow nasal cannula which was weaned to his usual home regimen of 6 L oxygen .  He has over 25 years pack year smoking history. Quit in 2020   Interim history: Discussed the use of AI scribe software for clinical note transcription with the patient, who gave verbal consent to proceed.  History of Present Illness [name] [name]. is a 63 year old male with severe COPD who presents for follow-up after recent hospitalizations for COPD exacerbation and pneumonia. He is accompanied by his daughter.  Chronic obstructive pulmonary disease (copd) exacerbation and functional status - Severe COPD with recent hospitalizations for COPD exacerbation and pneumonia - Currently requires ten liters of supplemental oxygen , reduced to eight liters when sitting still - Improvement in symptoms since hospitalization - Able to perform activities of daily living such as going to the bathroom independently - Medications include Symbicort , Spiriva , Daliresp , azithromycin  500 mg three  times a week, and prednisone  10 mg daily  Pulmonary nodule surveillance - Spiculated nodule in the upper lobe of the lung has remained stable over the past year - Patient does not to pursue further diagnostic testing or treatment for the nodule at that time has requested that we cancel the scheduled PET scan  Advance directives and care preferences - DNR in place - Prefers not to pursue aggressive treatments - Considering virtual pulmonary rehabilitation at home to avoid exposure to viruses    Outpatient Encounter Medications as of 04/17/2019  Medication Sig   albuterol  (PROVENTIL ) (2.5 MG/3ML) 0.083% nebulizer solution INHALE 3 MLS (1 VIAL) EVERY 6 HOURS VIA NEBULIAZATION ROUTE AS NEEDED FOR SHORTNESS OF BREATH (Patient taking differently: Take 2.5 mg by nebulization every 6 (six) hours as needed for shortness of breath. )   albuterol  (VENTOLIN  HFA) 108 (90 Base) MCG/ACT inhaler INHALE 2 PUFFS INTO THE LUNGS EVERY 4 HOURS AS NEEDED FOR WHEEZING OR SHORTNESS OF BREATH.   allopurinol  (ZYLOPRIM ) 100 MG tablet Take 1 tablet (100 mg total) by mouth daily.   amLODipine  (NORVASC ) 10 MG tablet Take 1 tablet (10 mg total) by mouth daily.   ammonium lactate (LAC-HYDRIN) 12 % lotion Apply 1 application topically daily.   AZO-CRANBERRY PO Take 2 tablets by mouth every 30 (thirty) days.   budesonide -formoterol  (SYMBICORT ) 160-4.5 MCG/ACT inhaler INAHLE 2 PUFFS INTO THE LUNGS TWICE DAILY.   Cyanocobalamin  (VITAMIN B-12) 1000 MCG SUBL Place 1 tablet (1,000 mcg total) under the tongue daily.   cyclobenzaprine  (FLEXERIL ) 5 MG tablet Take 1 tablet (5 mg total) by mouth 2 (two)  times daily as needed for muscle spasms.   diphenhydrAMINE (BENADRYL ALLERGY) 25 MG tablet Take 50 mg by mouth daily as needed (allergies).    ezetimibe  (ZETIA ) 10 MG tablet Take 1 tablet (10 mg total) by mouth daily.   gabapentin  (NEURONTIN ) 300 MG capsule Take 1 capsule (300 mg total) by mouth at bedtime.   hydrocortisone  1 % ointment  Apply 1 application topically 2 (two) times daily.   losartan  (COZAAR ) 50 MG tablet Take 1 tablet (50 mg total) by mouth daily. Needs PCP appointment.   metoprolol  succinate (TOPROL -XL) 50 MG 24 hr tablet Take 1 tablet (50 mg total) by mouth daily.   tiotropium (SPIRIVA  HANDIHALER) 18 MCG inhalation capsule Place 1 capsule (18 mcg total) into inhaler and inhale daily.   triamcinolone  ointment (KENALOG ) 0.1 % APPLY 1 APPLICATION TOPICALLY 2 TIMES DAILY. (Patient taking differently: Apply 1 application topically 2 (two) times daily. )   [DISCONTINUED] ALBUTEROL  IN Inhale into the lungs.   No facility-administered encounter medications on file as of 04/17/2019.   Vitals:   02/21/24 1301  BP: 121/79  Pulse: 95  Temp: 98.4 F (36.9 C)  Height: 5' 7 (1.702 m)  Weight: 163 lb (73.9 kg)  SpO2: 99% Comment: 10L TANK  TempSrc: Oral  BMI (Calculated): 25.52     Physical Exam VITALS: SaO2- 97% GEN: No acute distress. CV: Regular rate and rhythm, no murmurs. LUNGS: Clear to auscultation bilaterally, no wheezing, normal respiratory effort. SKIN JOINTS: Warm and dry, no rash.  .  Data Reviewed: Imaging CTA scan 12/13/13-no PE, mild airway thickening, atelectasis, cardiomegaly   CT chest 10/30/2018-emphysema in the upper lobes.  Mild lower lobe atelectasis.  CTA 01/13/2024-no pulmonary embolism, waxing and waning consolidative on the lingula.  Mild subpleural reticular density.  Spiculated nodular opacity on the right upper lobe stable compared to April 2025.  Mediastinal lymphadenopathy.  Moderate emphysema. I have reviewed the images personally.  PFTs. 07/22/15 2.19 [60%], FEV1 1.07 [37%],/49, DLCO 10.99 [38%] Severe obstruction with severe diffusion impairment   Cardiac Echo 10/27/14 Normal LV function; mild LVH and LVE; grade 1 diastolic dysfunction; moderate LAE; mild RAE; trace TR; severely elevated pulmonary pressure. PAP 72  Echo 07/21/2016 LVEF 60-65%, grade 1 diastolic  dysfunction, PAP 42  Sleep study 06/15/15 No significant obstructive sleep apnea, moderate O2 desaturation  Labs Alpha-1 antitrypsin 06/16/2016-137, PI MM HIV 07/06/2016-negative SSB 7.9, double-stranded DNA, SSA, rheumatoid factor-negative Hepatic function panel 07/17/2017-negative Assessment & Plan End-stage severe chronic obstructive pulmonary disease (COPD) with frequent exacerbations and long-term supplemental oxygen  therapy Severe COPD with frequent exacerbations, currently on 10 liters of oxygen , reduced to 8 liters when stationary. Oxygen  saturation is 97%. On Symbicort , Spiriva , Daliresp , azithromycin , and prednisone . Reports feeling better than previous hospitalizations. Virtual pulmonary rehabilitation discussed as an alternative to in-person sessions due to travel limitations and risk of viral exposure. - Continue Symbicort , Spiriva , Daliresp , chronic azithromycin  3 times weekly, and prednisone . - Consider biologic injections versus Daliresp  - Ordered virtual pulmonary rehabilitation. - Administered flu vaccine today. - Discuss RSV and COVID vaccines with primary care provider.  Patient continues to exhibit signs of hypercapnia associated with chronic respiratory failure secondary to COPD.  Interruption or failure to provide NIV would quickly lead to exacerbation of the patient's condition, hospital admission, and likely harm the patient.  Continued use is preferred.  The use of the NIV will treat patient's high PCO2 levels and can reduce risk of exacerbations and future hospitalizations when used at night and during the  day.  BiLevel/RAD has been considered and ruled out as patient requires continuous alarms, backup rate, battery, and portability which are not possible with BiLevel/RAD devices.  Ventilation is required to decrease the work of breathing and improve pulmonary status. Interruption of ventilator support would lead to decline of health status.  Patient is able to protect their  airways and clear secretions on their own.   Stable spiculated pulmonary nodule, right upper lobe, under observation Spiculated nodule in the right upper lobe, stable over the past year. PET scan was ordered to confirm malignancy, but he declined further investigation or treatment due to inability to undergo surgery or chemotherapy and preference to avoid distressing information. Biopsy deemed risky and challenging. - Canceled PET scan and further follow-up imaging per patient preference.  Do not resuscitate (DNR) status DNR status in place, consistent with his preference to avoid aggressive treatment and focus on quality of life. - Continue DNR status.   Pulmonary hypertension. 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  Continue supplemental oxygen  Work-up for alternative PAH etiologies including HIV, LFTs, ANA, RF, CCP is negative.  He has mild elevation in SSB which is likely nonspecific. We will continue to monitor  Vaccinations Prevnar 12/22/2013 Pneumovax 07/30/2012 Patient declined vaccinations in the past but has agreed to get a flu vaccination today.  Also told him to consider getting vaccinated for COVID, RSV.  He is 32 today and will need pneumonia vaccination as well.  Plan/Recommendations: Inhalers, Daliresp , chronic azithromycin , chronic prednisone  at 10 mg Supplemental oxygen  Virtual pulmonary rehab  I personally spent a total of 36 minutes in the care of the patient today including preparing to see the patient, getting/reviewing separately obtained history, performing a medically appropriate exam/evaluation, counseling and educating, placing orders, referring and communicating with other health care professionals, documenting clinical information in the EHR, and independently interpreting results.    Lonna Coder MD [facility] 02/21/2024, 1:07 PM  CC: Delbert Clam, MD  "

## 2024-02-25 ENCOUNTER — Other Ambulatory Visit: Payer: Self-pay

## 2024-02-28 ENCOUNTER — Telehealth: Payer: Self-pay

## 2024-02-28 NOTE — Telephone Encounter (Signed)
 [employer] [facility] wants an addended noted stating patient is currently on a ventilator and to continue use.   Copied from CRM [id number]. Topic: Clinical - Medication Prior Auth >> Feb 28, 2024 12:36 PM Leila C wrote: Reason for CRM: Romero from [employer] [phone] ext [phone] and fax [phone] is working on patient's PA for non-invasive ventilator and needs addendum office visit notes with Dr. Theophilus. CUB (continue use of benefits) notes for patient is currently on a ventilator and to continue using the ventilator. Please advise and call back.

## 2024-03-03 ENCOUNTER — Other Ambulatory Visit: Payer: Self-pay | Admitting: Pharmacist

## 2024-03-03 ENCOUNTER — Telehealth: Payer: Self-pay

## 2024-03-03 ENCOUNTER — Other Ambulatory Visit: Payer: Self-pay

## 2024-03-03 DIAGNOSIS — J449 Chronic obstructive pulmonary disease, unspecified: Secondary | ICD-10-CM

## 2024-03-03 MED ORDER — TIOTROPIUM BROMIDE 18 MCG IN CAPS
18.0000 ug | ORAL_CAPSULE | Freq: Every day | RESPIRATORY_TRACT | 2 refills | Status: AC
  Filled 2024-03-03: qty 30, 30d supply, fill #0
  Filled 2024-03-24 – 2024-04-04 (×2): qty 30, 30d supply, fill #1
  Filled 2024-04-24 – 2024-04-28 (×2): qty 30, 30d supply, fill #2

## 2024-03-03 NOTE — Telephone Encounter (Signed)
 Copied from CRM [id number]. Topic: Clinical - Home Health Verbal Orders >> Feb 29, 2024  3:11 PM Benton KIDD wrote: Caller/Agency: mr Marshel Golubski Number: [phone] Service Requested: Skilled Nursing Frequency: everyday to sit with him while daughter is at work  Any new concerns about the patient? No   we getting ready to move into another house in a month  and granddaughter is going to school and want to know how do i get a nurse to come out to the house to sit with me while daughter is at work   Dr. Theophilus please advise on this.  Would you like me to place a referral to home health?

## 2024-03-04 ENCOUNTER — Other Ambulatory Visit: Payer: Self-pay

## 2024-03-04 NOTE — Telephone Encounter (Signed)
 Ok to place a referral for home health

## 2024-03-04 NOTE — Telephone Encounter (Signed)
 I have placed this referral and answered the questions to the best of my knowledge.  I atc the number provided and there was no answer- left a vm stating referral has been placed.

## 2024-03-05 NOTE — Telephone Encounter (Signed)
 Copied from CRM #[id number]. Topic: Clinical - Medical Advice >> Mar 05, 2024  8:19 AM [name] wrote: Reason for CRM: Patient states Dr. Theophilus helped arrange a day time nurse to stay with him, while his daughter works, however patient states he does not want this to begin until after they move in to their new house.  ATC the pts daughter x1. Unable to leave vm due to vm not being set up.  [facility] can you add this to the referral notes? To reach out to the pt to verify when they would like home health to start or provide the pt with the agency information so they can sort this.

## 2024-03-05 NOTE — Telephone Encounter (Signed)
 It has been added in the notes in the referral. NFN

## 2024-03-10 ENCOUNTER — Telehealth: Admitting: *Deleted

## 2024-03-11 NOTE — Telephone Encounter (Signed)
 Copied from CRM #[id number]. Topic: Clinical - Order For Equipment >> Mar 10, 2024  8:33 AM Celestine FALCON wrote: Reason for CRM: Romero with [employer] is calling to request an addendum as the office notes received from visit on 02/21/2024 with Dr. Theophilus do not discuss the pt being on a ventilator. [employer] [facility] needs a statement from the clinic discussing the ventilator, the pt's use of the ventilator, and the benefit of the pt using the ventilator. Fax [phone].  Dr. Theophilus, Addendum has to made to LOV if patient is to continue the ventilator.  Please advise.  Thank you.

## 2024-03-14 ENCOUNTER — Telehealth: Payer: Self-pay

## 2024-03-14 ENCOUNTER — Other Ambulatory Visit (HOSPITAL_COMMUNITY): Payer: Self-pay

## 2024-03-14 NOTE — Telephone Encounter (Signed)
*  Pulm  Pharmacy Patient Advocate Encounter   Received notification from Fax that prior authorization for Roflumilast  tablets   is required/requested.   Insurance verification completed.   The patient is insured through [facility]   Per test claim: PA required; PA submitted to above mentioned insurance via Latent Key/confirmation #/[facility] Status is pending

## 2024-03-14 NOTE — Telephone Encounter (Signed)
 Your request has been approved Request Reference Number: [id number]. ROFLUMILAST  TAB is approved through 04/09/2025. Your patient may now fill this prescription and it will be covered. Authorization Expiration12/31/2026

## 2024-03-18 ENCOUNTER — Telehealth: Payer: Self-pay | Admitting: Pulmonary Disease

## 2024-03-18 NOTE — Telephone Encounter (Signed)
 Copied from CRM #[id number]. Topic: Clinical - Order For Equipment >> Mar 10, 2024  8:33 AM Celestine FALCON wrote: Reason for CRM: Romero with [employer] is calling to request an addendum as the office notes received from visit on 02/21/2024 with Dr. Theophilus do not discuss the pt being on a ventilator. [employer] [facility] needs a statement from the clinic discussing the ventilator, the pt's use of the ventilator, and the benefit of the pt using the ventilator. Fax [phone]. >> Mar 18, 2024 11:20 AM Benton KIDD wrote: Ms romero is calling back concerning the addendum for office notes . Still have not received anything . Ms romero is waiting to receive this back for patient ventilator  [phone]

## 2024-03-19 ENCOUNTER — Other Ambulatory Visit: Payer: Self-pay | Admitting: Family Medicine

## 2024-03-19 ENCOUNTER — Other Ambulatory Visit: Payer: Self-pay

## 2024-03-19 MED ORDER — LOPERAMIDE HCL 2 MG PO CAPS
2.0000 mg | ORAL_CAPSULE | ORAL | 0 refills | Status: DC | PRN
  Filled 2024-03-19: qty 30, 30d supply, fill #0

## 2024-03-20 ENCOUNTER — Other Ambulatory Visit: Payer: Self-pay

## 2024-03-24 ENCOUNTER — Other Ambulatory Visit: Payer: Self-pay | Admitting: Family Medicine

## 2024-03-24 ENCOUNTER — Other Ambulatory Visit: Payer: Self-pay

## 2024-03-24 DIAGNOSIS — J449 Chronic obstructive pulmonary disease, unspecified: Secondary | ICD-10-CM

## 2024-03-24 MED ORDER — BUDESONIDE-FORMOTEROL FUMARATE 160-4.5 MCG/ACT IN AERO
2.0000 | INHALATION_SPRAY | Freq: Two times a day (BID) | RESPIRATORY_TRACT | 0 refills | Status: DC
  Filled 2024-03-24: qty 10.2, 30d supply, fill #0

## 2024-03-24 MED ORDER — ALBUTEROL SULFATE (2.5 MG/3ML) 0.083% IN NEBU
2.5000 mg | INHALATION_SOLUTION | Freq: Four times a day (QID) | RESPIRATORY_TRACT | 0 refills | Status: DC | PRN
  Filled 2024-03-24: qty 360, 30d supply, fill #0

## 2024-03-24 MED ORDER — PREDNISONE 10 MG PO TABS
10.0000 mg | ORAL_TABLET | Freq: Every day | ORAL | 0 refills | Status: DC
  Filled 2024-03-24: qty 30, 30d supply, fill #0

## 2024-03-26 ENCOUNTER — Ambulatory Visit: Admitting: Family Medicine

## 2024-03-26 ENCOUNTER — Ambulatory Visit: Payer: Self-pay | Admitting: Family Medicine

## 2024-03-26 ENCOUNTER — Other Ambulatory Visit: Payer: Self-pay

## 2024-04-01 ENCOUNTER — Telehealth: Payer: Self-pay | Admitting: *Deleted

## 2024-04-01 NOTE — Telephone Encounter (Signed)
 Note Addendum needed per [employer] [facility]:   Copied from CRM #[id number]. Topic: Clinical - Medication Prior Auth >> Mar 31, 2024 12:15 PM Rilla B wrote: Reason for CRM: Romero from [employer] calling regarding prior auth.  States patient was seen by Dr avis on 11/13 and they did receive the clinical notes.however, they did mention address/mention the ventilator. Must be noted for [employer] to approve prior auth. Please call [phone] ext [phone]...  FAX [phone]

## 2024-04-04 ENCOUNTER — Other Ambulatory Visit: Payer: Self-pay

## 2024-04-04 ENCOUNTER — Other Ambulatory Visit: Payer: Self-pay | Admitting: Family Medicine

## 2024-04-07 ENCOUNTER — Other Ambulatory Visit: Payer: Self-pay

## 2024-04-07 ENCOUNTER — Telehealth: Payer: Self-pay | Admitting: Family Medicine

## 2024-04-07 NOTE — Telephone Encounter (Signed)
 Can you schedule him with Ronal Caldron to get his refills

## 2024-04-07 NOTE — Telephone Encounter (Signed)
 Noted

## 2024-04-07 NOTE — Telephone Encounter (Signed)
 Contacted patient, unable to reach, left voicemail for patient to call back.

## 2024-04-07 NOTE — Telephone Encounter (Signed)
 Can the medications be refilled for him?

## 2024-04-07 NOTE — Telephone Encounter (Signed)
 Patient returned call, Appointment scheduled with Ronal Caldron.

## 2024-04-07 NOTE — Telephone Encounter (Signed)
 Copied from CRM #[id number]. Topic: Appointments - Scheduling Inquiry for Clinic >> Apr 07, 2024  9:42 AM Shanda MATSU wrote:  Reason for CRM: Patient is wanting to know if there is any way he can be seen sooner as he needs a refill for meds, Ergocalciferol  50,000 Units & Azithromycin  500 mg, patient stated he is completely out of his meds, patient is aware that he needs to be seen before meds can be refilled but his appt is not until 05/15/2024, is req a call back.

## 2024-04-08 NOTE — Telephone Encounter (Signed)
 The clinical note has been addended.

## 2024-04-08 NOTE — Telephone Encounter (Signed)
 Per Dr Theophilus; clinical note addended- Notes faxed to [employer] [facility], to fax number provided. nfn

## 2024-04-09 ENCOUNTER — Other Ambulatory Visit: Payer: Self-pay

## 2024-04-09 ENCOUNTER — Other Ambulatory Visit: Payer: Self-pay | Admitting: *Deleted

## 2024-04-09 NOTE — Patient Instructions (Signed)
 Visit Information  Thank you for taking time to visit with me today. Please don't hesitate to contact me if I can be of assistance to you before our next scheduled appointment.  Your next care management appointment is by telephone on 05/22/24 at 9:30 am.  Telephone follow-up in 1 month  Please call the care guide team at [phone] if you need to cancel, schedule, or reschedule an appointment.   Please call the Suicide and Crisis Lifeline: 988 call the [place]  National Suicide Prevention Lifeline: 9207540115 or TTY: 7754241484 TTY 360 628 6637) to talk to a trained counselor call 1-800-273-TALK (toll free, 24 hour hotline) go to [facility] [address], [place] [phone]) call the [facility] Crisis Line: ([phone] call 911 if you are experiencing a Mental Health or Behavioral Health Crisis or need someone to talk to.  Louvenia Golomb, RN, BSN, Theatre Manager [employer] [phone]

## 2024-04-09 NOTE — Patient Outreach (Signed)
 Complex Care Management   Visit Note  04/09/2024  Name:  [name] [name]. MRN: [id number] DOB: [birth year]  Situation: Referral received for Complex Care Management related to COPD I obtained verbal consent from Patient.  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   COPD (chronic obstructive pulmonary disease) (HCC)    on 4L home O2   Essential hypertension    Gout    Thrombocytopenia 07/28/2012    Assessment: Patient Reported Symptoms:  Cognitive Cognitive Status: Able to follow simple commands, Alert and oriented to person, place, and time, Insightful and able to interpret abstract concepts, Normal speech and language skills, No symptoms reported Cognitive/Intellectual Conditions Management [RPT]: None reported or documented in medical history or problem list   Health Maintenance Behaviors: Annual physical exam, Healthy diet, Sleep adequate Healing Pattern: Average Health Facilitated by: Healthy diet, Prayer/meditation, Rest, Stress management  Neurological Neurological Review of Symptoms: No symptoms reported Neurological Management Strategies: Adequate rest, Routine screening Neurological Self-Management Outcome: 4 (good)  HEENT HEENT Symptoms Reported: Nasal discharge HEENT Management Strategies: Adequate rest, Routine screening, Medication therapy HEENT Self-Management Outcome: 3 (uncertain) HEENT Comment: Patient reports that he has nasal drainage.  He reports that he takes Mucinex  to help with the nasal drainage.    Cardiovascular Cardiovascular Symptoms Reported: No symptoms reported Does patient have uncontrolled Hypertension?: No Cardiovascular Management Strategies: Adequate rest, Routine screening Cardiovascular Self-Management Outcome: 4 (good)  Respiratory Respiratory Symptoms Reported: Shortness of breath Other Respiratory Symptoms:  Patient reports shortness of breath at intervals.  Patient wears 02 at 10 L per [place].  Patient reports that he also uses Albuterol  nebulizer and inhalers as needed for shortness of breath.  Patient reports that he wears Bipap at night. Patient reports that pulse ox reading ranges from 92-95% with 02 at 10L per [place]. Additional Respiratory Details: Patient with recent admission to hospital for PNA.  Patient is followed by Pulmonologist.  Patient reports that he is feeling much better since admission.  Denies increased work of breathing.  Pulse ox readings have been above 92% with current home 02 at 10L per [place]. Respiratory Management Strategies: Adequate rest, Routine screening, Oxygen  therapy, BiPAP Respiratory Self-Management Outcome: 3 (uncertain)  Endocrine Endocrine Symptoms Reported: No symptoms reported Is patient diabetic?: No Endocrine Self-Management Outcome: 4 (good)  Gastrointestinal Gastrointestinal Symptoms Reported: Diarrhea Additional Gastrointestinal Details: Patient reports that he has loose stools on occassion.  He informs me that he takes Immodium as needed for loose stools. Gastrointestinal Management Strategies: Adequate rest, Medication therapy Gastrointestinal Self-Management Outcome: 3 (uncertain)    Genitourinary Genitourinary Symptoms Reported: No symptoms reported Genitourinary Management Strategies: Adequate rest Genitourinary Self-Management Outcome: 4 (good)  Integumentary Integumentary Symptoms Reported: No symptoms reported Skin Management Strategies: Adequate rest, Routine screening  Musculoskeletal Musculoskelatal Symptoms Reviewed: Limited mobility Additional Musculoskeletal Details: Patient reports that he ambulates with a walker. Musculoskeletal Management Strategies: Adequate rest, Coping strategies, Routine screening Musculoskeletal Self-Management Outcome: 3 (uncertain) Falls in the past year?: No Number of falls in past year: 1 or less Was there an injury  with Fall?: No Fall Risk Category Calculator: 0 Patient Fall Risk Level: Low Fall Risk    Psychosocial Psychosocial Symptoms Reported: No symptoms reported Behavioral Management Strategies: Adequate rest Behavioral Health Self-Management Outcome: 4 (good) Major Change/Loss/Stressor/Fears (CP): Denies Quality of Family Relationships: helpful, involved, supportive Do you feel physically threatened by others?:  No    04/09/2024    PHQ2-9 Depression Screening   Little interest or pleasure in doing things Not at all  Feeling down, depressed, or hopeless Not at all  PHQ-2 - Total Score 0  Trouble falling or staying asleep, or sleeping too much    Feeling tired or having little energy    Poor appetite or overeating     Feeling bad about yourself - or that you are a failure or have let yourself or your family down    Trouble concentrating on things, such as reading the newspaper or watching television    Moving or speaking so slowly that other people could have noticed.  Or the opposite - being so fidgety or restless that you have been moving around a lot more than usual    Thoughts that you would be better off dead, or hurting yourself in some way    PHQ2-9 Total Score    If you checked off any problems, how difficult have these problems made it for you to do your work, take care of things at home, or get along with other people    Depression Interventions/Treatment      There were no vitals filed for this visit. Pain Scale: 0-10 Pain Score: 0-No pain  Medications Reviewed Today     Reviewed by Jorja Nichole LABOR, RN (Case Manager) on 04/09/24 at 1119  Med List Status: <None>   Medication Order Taking? Sig Documenting Provider Last Dose Status Informant  albuterol  (PROVENTIL ) (2.5 MG/3ML) 0.083% nebulizer solution [id number] Yes TAKE 3 MLS (2.5 MG TOTAL) BY NEBULIZATION EVERY 6 (SIX) HOURS AS NEEDED FOR SHORTNESS OF BREATH. Newlin, Enobong, MD  Active   albuterol  (VENTOLIN  HFA) 108 (90  Base) MCG/ACT inhaler [id number] Yes Inhale 2 puffs into the lungs every 6 (six) hours as needed for wheezing or shortness of breath Delbert Clam, MD  Active Self, Pharmacy Records    Discontinued 07/03/11 1455 (Entry Error)   allopurinol  (ZYLOPRIM ) 100 MG tablet [id number] Yes Take 1 tablet (100 mg total) by mouth daily. Newlin, Enobong, MD  Active Self, Pharmacy Records  amLODipine  (NORVASC ) 10 MG tablet [id number] Yes Take 1 tablet (10 mg total) by mouth daily. Newlin, Enobong, MD  Active Self, Pharmacy Records  Ascorbic Acid  (VITAMIN C ) 1000 MG tablet [id number] Yes Take 1,000 mg by mouth daily. [provider]  Active   azithromycin  (ZITHROMAX ) 500 MG tablet [id number] Yes Take 1 tablet (500 mg total) by mouth 3 (three) times a week. Take 1 tablet (500 mg total) by mouth 3 (three) times a week. (Monday, Wednesday, Friday) Napoleon Limes, MD  Active Self, Pharmacy Records  budesonide -formoterol  (SYMBICORT ) 160-4.5 MCG/ACT inhaler [id number] Yes Inhale 2 puffs into the lungs 2 (two) times daily. Newlin, Enobong, MD  Active   cyanocobalamin  (VITAMIN B12) 1000 MCG tablet [id number] Yes Take 1,000 mcg by mouth daily. [provider]  Active Self, Pharmacy Records  ezetimibe  (ZETIA ) 10 MG tablet [id number] Yes Take 1 tablet (10 mg total) by mouth daily. Newlin, Enobong, MD  Active Self, Pharmacy Records  guaiFENesin  (MUCINEX ) 600 MG 12 hr tablet [id number] Yes Take 600 mg by mouth daily. [provider]  Active   loperamide  (IMODIUM ) 2 MG capsule [id number] Yes Take 1 capsule (2 mg total) by mouth as needed for diarrhea or loose stools. Newlin, Enobong, MD  Active   montelukast  (SINGULAIR ) 10 MG tablet [id number] Yes Take 1 tablet (10 mg total) by mouth at bedtime. Newlin, Enobong, MD  Active  Self, Pharmacy Records  OXYGEN  [id number] Yes Inhale 10 L into the lungs continuous. [provider]  Active Self, Pharmacy Records           Med Note JACKOLYN WADDELL DEL   Wed Feb 13, 2024  7:13 AM)    predniSONE  (DELTASONE ) 10 MG tablet [id number] Yes Take 1 tablet (10 mg total) by mouth daily with breakfast. Newlin, Enobong, MD  Active   roflumilast  (DALIRESP ) 500 MCG TABS tablet [id number] Yes Take 1 tablet (500 mcg total) by mouth daily. Mannam, Praveen, MD  Active Self, Pharmacy Records  tiotropium (SPIRIVA  HANDIHALER) 18 MCG inhalation capsule [id number] Yes PLACE 1 CAPSULE (18 MCG TOTAL) INTO INHALER AND INHALE DAILY. Newlin, Enobong, MD  Active Self, Pharmacy Records  Tiotropium Bromide  (SPIRIVA  HANDIHALER) 18 MCG CAPS [id number] Yes Place 18 mcg into inhaler and inhale daily. Newlin, Enobong, MD  Active   tiZANidine  (ZANAFLEX ) 4 MG tablet [id number] Yes Take 1 tablet (4 mg total) by mouth every 8 (eight) hours as needed for muscle spasms. Newlin, Enobong, MD  Active Self, Pharmacy Records  Med List Note Steffi Nian, [place] 01/21/24 9048): Bipap at bedtime             Recommendation:   PCP Follow-up Specialty provider follow-up :Pulmonary -08/2024 Continue Current Plan of Care  Follow Up Plan:   Telephone follow-up in 1 month: 05/22/24 @ 9:30 am.  Matisse Salais, RN, BSN, ACM RN Care Manager [facility] [phone]

## 2024-04-11 ENCOUNTER — Other Ambulatory Visit: Payer: Self-pay

## 2024-04-16 ENCOUNTER — Ambulatory Visit: Payer: Self-pay | Admitting: *Deleted

## 2024-04-17 ENCOUNTER — Other Ambulatory Visit: Payer: Self-pay

## 2024-04-24 ENCOUNTER — Other Ambulatory Visit: Payer: Self-pay | Admitting: Family Medicine

## 2024-04-24 ENCOUNTER — Other Ambulatory Visit: Payer: Self-pay

## 2024-04-24 DIAGNOSIS — J449 Chronic obstructive pulmonary disease, unspecified: Secondary | ICD-10-CM

## 2024-04-24 MED ORDER — BUDESONIDE-FORMOTEROL FUMARATE 160-4.5 MCG/ACT IN AERO
2.0000 | INHALATION_SPRAY | Freq: Two times a day (BID) | RESPIRATORY_TRACT | 0 refills | Status: AC
  Filled 2024-04-24: qty 10.2, 30d supply, fill #0

## 2024-04-24 MED ORDER — LOPERAMIDE HCL 2 MG PO CAPS
2.0000 mg | ORAL_CAPSULE | ORAL | 0 refills | Status: AC | PRN
  Filled 2024-04-24: qty 30, 30d supply, fill #0

## 2024-04-24 MED ORDER — ALBUTEROL SULFATE (2.5 MG/3ML) 0.083% IN NEBU
2.5000 mg | INHALATION_SOLUTION | Freq: Four times a day (QID) | RESPIRATORY_TRACT | 0 refills | Status: AC | PRN
  Filled 2024-04-24: qty 360, 30d supply, fill #0

## 2024-04-25 ENCOUNTER — Other Ambulatory Visit: Payer: Self-pay

## 2024-04-28 ENCOUNTER — Other Ambulatory Visit: Payer: Self-pay

## 2024-04-29 ENCOUNTER — Other Ambulatory Visit: Payer: Self-pay

## 2024-04-30 ENCOUNTER — Other Ambulatory Visit: Payer: Self-pay

## 2024-04-30 ENCOUNTER — Other Ambulatory Visit: Payer: Self-pay | Admitting: Family Medicine

## 2024-04-30 MED ORDER — MONTELUKAST SODIUM 10 MG PO TABS
10.0000 mg | ORAL_TABLET | Freq: Every day | ORAL | 1 refills | Status: AC
  Filled 2024-04-30: qty 90, 90d supply, fill #0

## 2024-05-01 ENCOUNTER — Other Ambulatory Visit: Payer: Self-pay

## 2024-05-01 ENCOUNTER — Telehealth: Payer: Self-pay

## 2024-05-01 ENCOUNTER — Telehealth: Payer: Self-pay | Admitting: Family Medicine

## 2024-05-01 NOTE — Telephone Encounter (Signed)
 Copied from CRM [id number]. Topic: Clinical - Prescription Issue >> May 01, 2024  9:08 AM   Gattis SQUIBB wrote:  Reason for CRM: Pt called saying he needs the prednisone  10 mg that was prescribed in the hospital.  He said they keep him ut of the hospital.  That is why he ask for a refill.

## 2024-05-01 NOTE — Telephone Encounter (Signed)
 Routing to PCP for review.

## 2024-05-01 NOTE — Telephone Encounter (Signed)
 Copied from CRM #[id number]. Topic: Clinical - Prescription Issue >> May 01, 2024  1:09 PM Joesph PARAS wrote: Reason for CRM: Patient states that Dr. Theophilus prescribed patient a low-dose steroid after a hospital visit two months ago. Patient's refill requests keep going to his PCP and being denied. Patient requesting Dr. Theophilus call pharmacy or fill prescription himself.  Patient requesting to be called when this has been done.  Rx for prednisone  10 mg sent to pts pharmacy. Patient was notify. Nfn.

## 2024-05-02 ENCOUNTER — Other Ambulatory Visit: Payer: Self-pay

## 2024-05-02 MED ORDER — PREDNISONE 10 MG PO TABS
10.0000 mg | ORAL_TABLET | Freq: Every day | ORAL | 3 refills | Status: AC
  Filled 2024-05-02: qty 90, 90d supply, fill #0

## 2024-05-02 NOTE — Telephone Encounter (Signed)
 I have called in a prescription for 10 mg of prednisone  daily.  Please inform patient.

## 2024-05-02 NOTE — Telephone Encounter (Signed)
 He does have a Pulmonologist. Please have him reach out to them at [facility]. Thanks

## 2024-05-02 NOTE — Telephone Encounter (Signed)
 Patient was called and informed to reach out to his pulmonologist for his refill. Patient states that he will reach out to them late today.

## 2024-05-06 ENCOUNTER — Other Ambulatory Visit: Payer: Self-pay

## 2024-05-06 NOTE — Telephone Encounter (Signed)
 Rx sent, pt was informed. nfn

## 2024-05-15 ENCOUNTER — Ambulatory Visit: Admitting: Family Medicine

## 2024-05-22 ENCOUNTER — Telehealth: Admitting: *Deleted

## 2024-06-24 ENCOUNTER — Ambulatory Visit: Admitting: Family Medicine
